# Patient Record
Sex: Male | Born: 1940 | Race: White | Hispanic: No | Smoking: Current every day smoker
Health system: Southern US, Community
[De-identification: ages and names within clinical notes are randomized; demographics above are authoritative.]

## PROBLEM LIST (undated history)

## (undated) DIAGNOSIS — Z8711 Personal history of peptic ulcer disease: Secondary | ICD-10-CM

## (undated) DIAGNOSIS — I82409 Acute embolism and thrombosis of unspecified deep veins of unspecified lower extremity: Secondary | ICD-10-CM

## (undated) DIAGNOSIS — I723 Aneurysm of iliac artery: Secondary | ICD-10-CM

## (undated) DIAGNOSIS — I509 Heart failure, unspecified: Secondary | ICD-10-CM

## (undated) DIAGNOSIS — F32A Depression, unspecified: Secondary | ICD-10-CM

## (undated) DIAGNOSIS — I219 Acute myocardial infarction, unspecified: Secondary | ICD-10-CM

## (undated) DIAGNOSIS — I639 Cerebral infarction, unspecified: Secondary | ICD-10-CM

## (undated) DIAGNOSIS — I7100 Dissection of unspecified site of aorta: Secondary | ICD-10-CM

## (undated) DIAGNOSIS — I10 Essential (primary) hypertension: Secondary | ICD-10-CM

## (undated) DIAGNOSIS — E785 Hyperlipidemia, unspecified: Secondary | ICD-10-CM

## (undated) DIAGNOSIS — F329 Major depressive disorder, single episode, unspecified: Secondary | ICD-10-CM

## (undated) DIAGNOSIS — I255 Ischemic cardiomyopathy: Secondary | ICD-10-CM

## (undated) DIAGNOSIS — I447 Left bundle-branch block, unspecified: Secondary | ICD-10-CM

## (undated) DIAGNOSIS — N200 Calculus of kidney: Secondary | ICD-10-CM

## (undated) DIAGNOSIS — I2510 Atherosclerotic heart disease of native coronary artery without angina pectoris: Secondary | ICD-10-CM

## (undated) DIAGNOSIS — Z8719 Personal history of other diseases of the digestive system: Secondary | ICD-10-CM

## (undated) DIAGNOSIS — I480 Paroxysmal atrial fibrillation: Secondary | ICD-10-CM

## (undated) DIAGNOSIS — J449 Chronic obstructive pulmonary disease, unspecified: Secondary | ICD-10-CM

## (undated) DIAGNOSIS — I679 Cerebrovascular disease, unspecified: Secondary | ICD-10-CM

## (undated) DIAGNOSIS — N189 Chronic kidney disease, unspecified: Secondary | ICD-10-CM

## (undated) HISTORY — PX: CARDIAC CATHETERIZATION: SHX172

## (undated) HISTORY — DX: Acute embolism and thrombosis of unspecified deep veins of unspecified lower extremity: I82.409

## (undated) HISTORY — DX: Essential (primary) hypertension: I10

## (undated) HISTORY — DX: Cerebrovascular disease, unspecified: I67.9

## (undated) HISTORY — DX: Dissection of unspecified site of aorta: I71.00

## (undated) HISTORY — DX: Left bundle-branch block, unspecified: I44.7

## (undated) HISTORY — DX: Ischemic cardiomyopathy: I25.5

## (undated) HISTORY — DX: Calculus of kidney: N20.0

## (undated) HISTORY — DX: Aneurysm of iliac artery: I72.3

## (undated) HISTORY — DX: Acute myocardial infarction, unspecified: I21.9

## (undated) HISTORY — DX: Chronic obstructive pulmonary disease, unspecified: J44.9

## (undated) HISTORY — PX: CYSTOSCOPY W/ STONE MANIPULATION: SHX1427

## (undated) HISTORY — DX: Atherosclerotic heart disease of native coronary artery without angina pectoris: I25.10

## (undated) HISTORY — PX: BACK SURGERY: SHX140

## (undated) HISTORY — DX: Cerebral infarction, unspecified: I63.9

## (undated) HISTORY — DX: Hyperlipidemia, unspecified: E78.5

## (undated) HISTORY — DX: Heart failure, unspecified: I50.9

---

## 1995-12-12 DIAGNOSIS — I219 Acute myocardial infarction, unspecified: Secondary | ICD-10-CM

## 1995-12-12 HISTORY — DX: Acute myocardial infarction, unspecified: I21.9

## 1996-12-11 HISTORY — PX: CORONARY ARTERY BYPASS GRAFT: SHX141

## 1998-11-24 ENCOUNTER — Encounter: Payer: Self-pay | Admitting: Urology

## 1998-11-26 ENCOUNTER — Encounter: Payer: Self-pay | Admitting: Urology

## 1998-11-26 ENCOUNTER — Ambulatory Visit (HOSPITAL_COMMUNITY): Admission: RE | Admit: 1998-11-26 | Discharge: 1998-11-26 | Payer: Self-pay | Admitting: Urology

## 2001-04-09 ENCOUNTER — Ambulatory Visit (HOSPITAL_COMMUNITY): Admission: RE | Admit: 2001-04-09 | Discharge: 2001-04-09 | Payer: Self-pay | Admitting: Cardiology

## 2002-04-16 ENCOUNTER — Emergency Department (HOSPITAL_COMMUNITY): Admission: EM | Admit: 2002-04-16 | Discharge: 2002-04-16 | Payer: Self-pay

## 2002-04-17 ENCOUNTER — Encounter: Payer: Self-pay | Admitting: Urology

## 2002-04-18 ENCOUNTER — Encounter: Payer: Self-pay | Admitting: Urology

## 2002-04-18 ENCOUNTER — Inpatient Hospital Stay (HOSPITAL_COMMUNITY): Admission: AD | Admit: 2002-04-18 | Discharge: 2002-04-20 | Payer: Self-pay | Admitting: Urology

## 2002-05-01 ENCOUNTER — Encounter: Payer: Self-pay | Admitting: Urology

## 2002-05-01 ENCOUNTER — Ambulatory Visit (HOSPITAL_BASED_OUTPATIENT_CLINIC_OR_DEPARTMENT_OTHER): Admission: RE | Admit: 2002-05-01 | Discharge: 2002-05-01 | Payer: Self-pay | Admitting: Urology

## 2004-06-10 HISTORY — PX: CAROTID ENDARTERECTOMY: SUR193

## 2004-06-22 ENCOUNTER — Ambulatory Visit (HOSPITAL_COMMUNITY): Admission: RE | Admit: 2004-06-22 | Discharge: 2004-06-22 | Payer: Self-pay | Admitting: *Deleted

## 2004-06-28 ENCOUNTER — Inpatient Hospital Stay (HOSPITAL_COMMUNITY): Admission: RE | Admit: 2004-06-28 | Discharge: 2004-06-29 | Payer: Self-pay | Admitting: *Deleted

## 2004-06-28 ENCOUNTER — Encounter (INDEPENDENT_AMBULATORY_CARE_PROVIDER_SITE_OTHER): Payer: Self-pay | Admitting: *Deleted

## 2004-12-11 DIAGNOSIS — I447 Left bundle-branch block, unspecified: Secondary | ICD-10-CM

## 2004-12-11 DIAGNOSIS — I480 Paroxysmal atrial fibrillation: Secondary | ICD-10-CM

## 2004-12-11 HISTORY — DX: Paroxysmal atrial fibrillation: I48.0

## 2004-12-11 HISTORY — DX: Left bundle-branch block, unspecified: I44.7

## 2005-05-16 ENCOUNTER — Ambulatory Visit: Payer: Self-pay | Admitting: Cardiology

## 2005-05-18 ENCOUNTER — Ambulatory Visit: Payer: Self-pay

## 2005-06-09 ENCOUNTER — Ambulatory Visit: Payer: Self-pay | Admitting: Cardiology

## 2005-07-04 ENCOUNTER — Ambulatory Visit: Payer: Self-pay | Admitting: Cardiology

## 2005-07-29 ENCOUNTER — Ambulatory Visit: Payer: Self-pay | Admitting: Cardiovascular Disease

## 2005-07-30 ENCOUNTER — Inpatient Hospital Stay (HOSPITAL_COMMUNITY): Admission: EM | Admit: 2005-07-30 | Discharge: 2005-08-04 | Payer: Self-pay | Admitting: Emergency Medicine

## 2005-07-31 ENCOUNTER — Encounter: Payer: Self-pay | Admitting: Cardiology

## 2005-08-01 ENCOUNTER — Encounter: Payer: Self-pay | Admitting: Cardiology

## 2005-08-10 ENCOUNTER — Ambulatory Visit: Payer: Self-pay | Admitting: Cardiology

## 2005-08-10 ENCOUNTER — Ambulatory Visit: Payer: Self-pay

## 2005-08-22 ENCOUNTER — Ambulatory Visit: Payer: Self-pay | Admitting: *Deleted

## 2005-08-27 ENCOUNTER — Emergency Department (HOSPITAL_COMMUNITY): Admission: EM | Admit: 2005-08-27 | Discharge: 2005-08-27 | Payer: Self-pay | Admitting: Emergency Medicine

## 2005-08-29 ENCOUNTER — Ambulatory Visit: Payer: Self-pay | Admitting: Cardiology

## 2005-08-29 ENCOUNTER — Ambulatory Visit: Payer: Self-pay | Admitting: Internal Medicine

## 2005-08-31 ENCOUNTER — Ambulatory Visit: Payer: Self-pay | Admitting: Cardiology

## 2005-09-06 ENCOUNTER — Ambulatory Visit: Payer: Self-pay | Admitting: Cardiology

## 2005-09-13 ENCOUNTER — Ambulatory Visit: Payer: Self-pay | Admitting: Cardiology

## 2005-09-20 ENCOUNTER — Ambulatory Visit: Payer: Self-pay | Admitting: Cardiology

## 2005-09-27 ENCOUNTER — Ambulatory Visit: Payer: Self-pay | Admitting: Cardiology

## 2005-09-27 ENCOUNTER — Ambulatory Visit: Payer: Self-pay | Admitting: Cardiovascular Disease

## 2005-10-04 ENCOUNTER — Ambulatory Visit: Payer: Self-pay | Admitting: Cardiology

## 2005-10-11 ENCOUNTER — Ambulatory Visit: Payer: Self-pay | Admitting: Cardiology

## 2005-10-19 ENCOUNTER — Ambulatory Visit: Payer: Self-pay | Admitting: Cardiology

## 2005-10-23 ENCOUNTER — Ambulatory Visit: Payer: Self-pay | Admitting: Internal Medicine

## 2005-10-23 ENCOUNTER — Encounter: Payer: Self-pay | Admitting: Cardiology

## 2005-10-23 ENCOUNTER — Ambulatory Visit: Payer: Self-pay | Admitting: Cardiology

## 2005-10-23 ENCOUNTER — Ambulatory Visit (HOSPITAL_COMMUNITY): Admission: RE | Admit: 2005-10-23 | Discharge: 2005-10-23 | Payer: Self-pay | Admitting: Cardiology

## 2005-10-31 ENCOUNTER — Ambulatory Visit: Payer: Self-pay | Admitting: Cardiology

## 2005-11-10 ENCOUNTER — Ambulatory Visit: Payer: Self-pay | Admitting: Cardiovascular Disease

## 2005-11-10 ENCOUNTER — Ambulatory Visit: Payer: Self-pay | Admitting: Cardiology

## 2005-11-13 ENCOUNTER — Ambulatory Visit: Payer: Self-pay | Admitting: Cardiology

## 2005-11-14 ENCOUNTER — Ambulatory Visit (HOSPITAL_COMMUNITY): Admission: RE | Admit: 2005-11-14 | Discharge: 2005-11-14 | Payer: Self-pay | Admitting: Cardiology

## 2005-11-17 ENCOUNTER — Ambulatory Visit: Payer: Self-pay | Admitting: Cardiology

## 2005-12-08 ENCOUNTER — Ambulatory Visit: Payer: Self-pay | Admitting: Cardiology

## 2005-12-19 ENCOUNTER — Ambulatory Visit: Payer: Self-pay | Admitting: Cardiology

## 2005-12-20 ENCOUNTER — Ambulatory Visit: Payer: Self-pay | Admitting: Cardiology

## 2005-12-20 ENCOUNTER — Inpatient Hospital Stay (HOSPITAL_COMMUNITY): Admission: AD | Admit: 2005-12-20 | Discharge: 2005-12-26 | Payer: Self-pay | Admitting: Cardiology

## 2005-12-29 ENCOUNTER — Ambulatory Visit: Payer: Self-pay | Admitting: Cardiology

## 2006-01-04 ENCOUNTER — Ambulatory Visit: Payer: Self-pay | Admitting: Cardiology

## 2006-01-12 ENCOUNTER — Ambulatory Visit: Payer: Self-pay | Admitting: Cardiology

## 2006-01-19 ENCOUNTER — Ambulatory Visit: Payer: Self-pay | Admitting: Internal Medicine

## 2006-01-29 ENCOUNTER — Ambulatory Visit: Payer: Self-pay | Admitting: Cardiology

## 2006-02-06 ENCOUNTER — Ambulatory Visit: Payer: Self-pay | Admitting: *Deleted

## 2006-03-06 ENCOUNTER — Ambulatory Visit: Payer: Self-pay | Admitting: Cardiology

## 2006-04-03 ENCOUNTER — Ambulatory Visit: Payer: Self-pay | Admitting: Cardiovascular Disease

## 2006-05-04 ENCOUNTER — Ambulatory Visit: Payer: Self-pay | Admitting: Cardiology

## 2006-05-21 ENCOUNTER — Ambulatory Visit: Payer: Self-pay | Admitting: Cardiology

## 2006-06-01 ENCOUNTER — Ambulatory Visit: Payer: Self-pay | Admitting: Cardiovascular Disease

## 2006-06-19 ENCOUNTER — Ambulatory Visit: Payer: Self-pay

## 2006-06-19 ENCOUNTER — Encounter: Payer: Self-pay | Admitting: Cardiology

## 2006-06-19 ENCOUNTER — Ambulatory Visit: Payer: Self-pay | Admitting: Cardiology

## 2006-07-11 ENCOUNTER — Ambulatory Visit: Payer: Self-pay | Admitting: *Deleted

## 2006-08-08 ENCOUNTER — Ambulatory Visit: Payer: Self-pay | Admitting: Cardiology

## 2006-09-05 ENCOUNTER — Ambulatory Visit: Payer: Self-pay | Admitting: Cardiology

## 2006-10-03 ENCOUNTER — Ambulatory Visit: Payer: Self-pay | Admitting: Cardiology

## 2006-10-04 ENCOUNTER — Ambulatory Visit: Payer: Self-pay | Admitting: Pulmonary Disease

## 2006-10-04 ENCOUNTER — Ambulatory Visit: Payer: Self-pay | Admitting: Cardiology

## 2006-10-04 ENCOUNTER — Inpatient Hospital Stay (HOSPITAL_COMMUNITY): Admission: EM | Admit: 2006-10-04 | Discharge: 2006-10-09 | Payer: Self-pay | Admitting: *Deleted

## 2006-10-05 ENCOUNTER — Encounter: Payer: Self-pay | Admitting: Internal Medicine

## 2006-10-15 ENCOUNTER — Ambulatory Visit: Payer: Self-pay | Admitting: Cardiology

## 2006-10-17 ENCOUNTER — Ambulatory Visit (HOSPITAL_COMMUNITY): Admission: RE | Admit: 2006-10-17 | Discharge: 2006-10-17 | Payer: Self-pay | Admitting: Cardiology

## 2006-10-17 ENCOUNTER — Ambulatory Visit: Payer: Self-pay | Admitting: Internal Medicine

## 2006-10-23 ENCOUNTER — Ambulatory Visit: Payer: Self-pay | Admitting: Cardiology

## 2006-10-26 ENCOUNTER — Ambulatory Visit: Payer: Self-pay | Admitting: Pulmonary Disease

## 2006-11-12 ENCOUNTER — Ambulatory Visit: Payer: Self-pay | Admitting: Internal Medicine

## 2006-12-10 ENCOUNTER — Ambulatory Visit: Payer: Self-pay | Admitting: Cardiovascular Disease

## 2007-01-07 ENCOUNTER — Ambulatory Visit: Payer: Self-pay | Admitting: Internal Medicine

## 2007-02-04 ENCOUNTER — Ambulatory Visit: Payer: Self-pay | Admitting: Cardiology

## 2007-03-04 ENCOUNTER — Ambulatory Visit: Payer: Self-pay | Admitting: Cardiology

## 2007-04-08 ENCOUNTER — Ambulatory Visit: Payer: Self-pay | Admitting: Cardiology

## 2007-04-12 ENCOUNTER — Ambulatory Visit: Payer: Self-pay | Admitting: Cardiology

## 2007-04-12 ENCOUNTER — Ambulatory Visit: Payer: Self-pay | Admitting: Pulmonary Disease

## 2007-04-12 LAB — CONVERTED CEMR LAB
AST: 30 units/L (ref 0–37)
Albumin: 3.8 g/dL (ref 3.5–5.2)
Alkaline Phosphatase: 82 units/L (ref 39–117)
CO2: 30 meq/L (ref 19–32)
Calcium: 8.8 mg/dL (ref 8.4–10.5)
GFR calc Af Amer: 71 mL/min
Glucose, Bld: 100 mg/dL — ABNORMAL HIGH (ref 70–99)
Sodium: 144 meq/L (ref 135–145)

## 2007-04-22 ENCOUNTER — Ambulatory Visit: Payer: Self-pay | Admitting: Cardiology

## 2007-04-22 ENCOUNTER — Ambulatory Visit: Payer: Self-pay | Admitting: Internal Medicine

## 2007-04-22 LAB — CONVERTED CEMR LAB
BUN: 10 mg/dL (ref 6–23)
Calcium: 8.7 mg/dL (ref 8.4–10.5)
Chloride: 111 meq/L (ref 96–112)
Creatinine, Ser: 1.1 mg/dL (ref 0.4–1.5)
GFR calc Af Amer: 87 mL/min
GFR calc non Af Amer: 72 mL/min
Glucose, Bld: 104 mg/dL — ABNORMAL HIGH (ref 70–99)
Sodium: 146 meq/L — ABNORMAL HIGH (ref 135–145)

## 2007-05-10 ENCOUNTER — Ambulatory Visit: Payer: Self-pay | Admitting: Cardiology

## 2007-05-10 LAB — CONVERTED CEMR LAB
CO2: 28 meq/L (ref 19–32)
Calcium: 8.3 mg/dL — ABNORMAL LOW (ref 8.4–10.5)
Chloride: 111 meq/L (ref 96–112)
Creatinine, Ser: 1.3 mg/dL (ref 0.4–1.5)
GFR calc Af Amer: 71 mL/min
Potassium: 3.2 meq/L — ABNORMAL LOW (ref 3.5–5.1)

## 2007-05-16 ENCOUNTER — Ambulatory Visit: Payer: Self-pay | Admitting: Cardiology

## 2007-05-16 LAB — CONVERTED CEMR LAB
CO2: 29 meq/L (ref 19–32)
Calcium: 9.2 mg/dL (ref 8.4–10.5)
Chloride: 108 meq/L (ref 96–112)
GFR calc Af Amer: 66 mL/min
GFR calc non Af Amer: 54 mL/min
Glucose, Bld: 99 mg/dL (ref 70–99)
Potassium: 3.7 meq/L (ref 3.5–5.1)
Sodium: 143 meq/L (ref 135–145)

## 2007-06-19 ENCOUNTER — Ambulatory Visit: Payer: Self-pay | Admitting: Cardiology

## 2007-06-19 ENCOUNTER — Ambulatory Visit: Payer: Self-pay

## 2007-07-23 ENCOUNTER — Ambulatory Visit: Payer: Self-pay | Admitting: Cardiology

## 2007-08-08 ENCOUNTER — Ambulatory Visit: Payer: Self-pay | Admitting: Cardiology

## 2007-09-12 ENCOUNTER — Ambulatory Visit: Payer: Self-pay | Admitting: Cardiology

## 2007-09-12 LAB — CONVERTED CEMR LAB
ALT: 36 units/L (ref 0–53)
AST: 24 units/L (ref 0–37)
Albumin: 3.6 g/dL (ref 3.5–5.2)
Total Protein: 6.8 g/dL (ref 6.0–8.3)
VLDL: 48 mg/dL — ABNORMAL HIGH (ref 0–40)

## 2007-10-10 ENCOUNTER — Ambulatory Visit: Payer: Self-pay | Admitting: Cardiology

## 2007-10-10 ENCOUNTER — Ambulatory Visit: Payer: Self-pay | Admitting: Internal Medicine

## 2007-10-11 ENCOUNTER — Ambulatory Visit: Payer: Self-pay

## 2007-10-14 ENCOUNTER — Ambulatory Visit: Payer: Self-pay | Admitting: Cardiology

## 2007-10-14 ENCOUNTER — Ambulatory Visit: Payer: Self-pay

## 2007-10-14 LAB — CONVERTED CEMR LAB
ALT: 28 units/L (ref 0–53)
Albumin: 4 g/dL (ref 3.5–5.2)
Bilirubin, Direct: 0.1 mg/dL (ref 0.0–0.3)
CO2: 28 meq/L (ref 19–32)
Calcium: 8.9 mg/dL (ref 8.4–10.5)
Chloride: 108 meq/L (ref 96–112)
Cholesterol: 139 mg/dL (ref 0–200)
Eosinophils Absolute: 0.6 10*3/uL (ref 0.0–0.6)
Eosinophils Relative: 7.1 % — ABNORMAL HIGH (ref 0.0–5.0)
GFR calc Af Amer: 56 mL/min
GFR calc non Af Amer: 46 mL/min
Glucose, Bld: 108 mg/dL — ABNORMAL HIGH (ref 70–99)
HCT: 42.4 % (ref 39.0–52.0)
Hemoglobin: 14.5 g/dL (ref 13.0–17.0)
LDL Cholesterol: 68 mg/dL (ref 0–99)
Lymphocytes Relative: 33.1 % (ref 12.0–46.0)
MCHC: 34.2 g/dL (ref 30.0–36.0)
MCV: 92.4 fL (ref 78.0–100.0)
Monocytes Absolute: 0.5 10*3/uL (ref 0.2–0.7)
Platelets: 176 10*3/uL (ref 150–400)
Sodium: 145 meq/L (ref 135–145)
TSH: 0.43 microintl units/mL (ref 0.35–5.50)
Total CHOL/HDL Ratio: 4.1

## 2007-11-06 ENCOUNTER — Ambulatory Visit: Payer: Self-pay | Admitting: Cardiology

## 2007-11-27 ENCOUNTER — Ambulatory Visit: Payer: Self-pay | Admitting: Cardiovascular Disease

## 2007-12-23 ENCOUNTER — Ambulatory Visit: Payer: Self-pay | Admitting: Cardiology

## 2008-01-20 ENCOUNTER — Ambulatory Visit: Payer: Self-pay | Admitting: Internal Medicine

## 2008-02-17 ENCOUNTER — Ambulatory Visit: Payer: Self-pay | Admitting: Cardiology

## 2008-03-17 ENCOUNTER — Ambulatory Visit: Payer: Self-pay | Admitting: Cardiology

## 2008-04-08 ENCOUNTER — Ambulatory Visit: Payer: Self-pay | Admitting: Cardiovascular Disease

## 2008-04-08 ENCOUNTER — Ambulatory Visit: Payer: Self-pay | Admitting: Cardiology

## 2008-04-08 LAB — CONVERTED CEMR LAB
ALT: 30 units/L (ref 0–53)
AST: 26 units/L (ref 0–37)
Alkaline Phosphatase: 67 units/L (ref 39–117)
BUN: 14 mg/dL (ref 6–23)
Bilirubin, Direct: 0.1 mg/dL (ref 0.0–0.3)
CO2: 27 meq/L (ref 19–32)
Calcium: 8.7 mg/dL (ref 8.4–10.5)
Chloride: 106 meq/L (ref 96–112)
Creatinine, Ser: 1.6 mg/dL — ABNORMAL HIGH (ref 0.4–1.5)
GFR calc non Af Amer: 46 mL/min
TSH: 0.5 microintl units/mL (ref 0.35–5.50)
Total Protein: 6.8 g/dL (ref 6.0–8.3)
Triglycerides: 228 mg/dL (ref 0–149)
VLDL: 46 mg/dL — ABNORMAL HIGH (ref 0–40)

## 2008-04-10 ENCOUNTER — Ambulatory Visit: Payer: Self-pay

## 2008-04-10 ENCOUNTER — Encounter: Payer: Self-pay | Admitting: Cardiology

## 2008-04-16 ENCOUNTER — Ambulatory Visit: Payer: Self-pay | Admitting: Internal Medicine

## 2008-05-13 ENCOUNTER — Ambulatory Visit: Payer: Self-pay | Admitting: Cardiovascular Disease

## 2008-06-10 ENCOUNTER — Ambulatory Visit: Payer: Self-pay | Admitting: Cardiology

## 2008-06-29 ENCOUNTER — Ambulatory Visit: Payer: Self-pay | Admitting: Cardiology

## 2008-07-28 ENCOUNTER — Ambulatory Visit: Payer: Self-pay

## 2008-07-28 ENCOUNTER — Ambulatory Visit: Payer: Self-pay | Admitting: Cardiology

## 2008-08-25 ENCOUNTER — Ambulatory Visit: Payer: Self-pay | Admitting: Cardiovascular Disease

## 2008-09-08 ENCOUNTER — Ambulatory Visit: Payer: Self-pay | Admitting: Cardiology

## 2008-09-25 ENCOUNTER — Ambulatory Visit: Payer: Self-pay | Admitting: Cardiology

## 2008-09-25 LAB — CONVERTED CEMR LAB
Alkaline Phosphatase: 87 units/L (ref 39–117)
BUN: 16 mg/dL (ref 6–23)
Bilirubin, Direct: 0.1 mg/dL (ref 0.0–0.3)
GFR calc non Af Amer: 59 mL/min
TSH: 0.47 microintl units/mL (ref 0.35–5.50)
Total CHOL/HDL Ratio: 4.5
Total Protein: 6.8 g/dL (ref 6.0–8.3)
VLDL: 55 mg/dL — ABNORMAL HIGH (ref 0–40)

## 2008-10-02 ENCOUNTER — Ambulatory Visit: Payer: Self-pay | Admitting: Vascular Surgery

## 2008-10-02 ENCOUNTER — Ambulatory Visit: Payer: Self-pay | Admitting: Internal Medicine

## 2008-10-02 ENCOUNTER — Inpatient Hospital Stay (HOSPITAL_COMMUNITY): Admission: EM | Admit: 2008-10-02 | Discharge: 2008-10-03 | Payer: Self-pay | Admitting: Emergency Medicine

## 2008-10-07 ENCOUNTER — Ambulatory Visit: Payer: Self-pay | Admitting: Cardiology

## 2008-10-07 LAB — CONVERTED CEMR LAB
BUN: 14 mg/dL (ref 6–23)
CO2: 32 meq/L (ref 19–32)
GFR calc Af Amer: 71 mL/min
GFR calc non Af Amer: 59 mL/min
Potassium: 3.3 meq/L — ABNORMAL LOW (ref 3.5–5.1)

## 2008-10-18 DIAGNOSIS — I4821 Permanent atrial fibrillation: Secondary | ICD-10-CM

## 2008-10-18 DIAGNOSIS — I255 Ischemic cardiomyopathy: Secondary | ICD-10-CM | POA: Insufficient documentation

## 2008-10-18 DIAGNOSIS — I059 Rheumatic mitral valve disease, unspecified: Secondary | ICD-10-CM | POA: Insufficient documentation

## 2008-10-29 ENCOUNTER — Ambulatory Visit: Payer: Self-pay | Admitting: Internal Medicine

## 2008-10-29 ENCOUNTER — Ambulatory Visit: Payer: Self-pay | Admitting: Cardiology

## 2008-10-29 LAB — CONVERTED CEMR LAB
CO2: 28 meq/L (ref 19–32)
Calcium: 9.2 mg/dL (ref 8.4–10.5)
Chloride: 108 meq/L (ref 96–112)
GFR calc non Af Amer: 59 mL/min
Glucose, Bld: 110 mg/dL — ABNORMAL HIGH (ref 70–99)
Potassium: 3.3 meq/L — ABNORMAL LOW (ref 3.5–5.1)

## 2008-11-06 ENCOUNTER — Ambulatory Visit: Payer: Self-pay | Admitting: Cardiology

## 2008-11-06 LAB — CONVERTED CEMR LAB
BUN: 14 mg/dL (ref 6–23)
CO2: 29 meq/L (ref 19–32)
Calcium: 8.7 mg/dL (ref 8.4–10.5)
Chloride: 107 meq/L (ref 96–112)
Glucose, Bld: 118 mg/dL — ABNORMAL HIGH (ref 70–99)
Potassium: 3.4 meq/L — ABNORMAL LOW (ref 3.5–5.1)
Sodium: 145 meq/L (ref 135–145)

## 2008-11-26 ENCOUNTER — Ambulatory Visit: Payer: Self-pay | Admitting: Cardiology

## 2008-11-26 LAB — CONVERTED CEMR LAB
BUN: 11 mg/dL (ref 6–23)
Calcium: 9.1 mg/dL (ref 8.4–10.5)
GFR calc non Af Amer: 71 mL/min
Glucose, Bld: 99 mg/dL (ref 70–99)

## 2008-12-16 ENCOUNTER — Ambulatory Visit: Payer: Self-pay | Admitting: Cardiology

## 2008-12-16 LAB — CONVERTED CEMR LAB
Albumin: 4.1 g/dL (ref 3.5–5.2)
CO2: 28 meq/L (ref 19–32)
Cholesterol: 224 mg/dL (ref 0–200)
Creatinine, Ser: 1.4 mg/dL (ref 0.4–1.5)
Direct LDL: 127.7 mg/dL
Eosinophils Absolute: 0.5 10*3/uL (ref 0.0–0.7)
Eosinophils Relative: 6.6 % — ABNORMAL HIGH (ref 0.0–5.0)
GFR calc Af Amer: 65 mL/min
Glucose, Bld: 103 mg/dL — ABNORMAL HIGH (ref 70–99)
HCT: 43.5 % (ref 39.0–52.0)
HDL: 31.9 mg/dL — ABNORMAL LOW (ref >39.0)
MCV: 93.2 fL (ref 78.0–100.0)
Monocytes Absolute: 0.6 10*3/uL (ref 0.1–1.0)
Neutro Abs: 4.5 10*3/uL (ref 1.4–7.7)
Neutrophils Relative %: 55.6 % (ref 43.0–77.0)
RDW: 13.7 % (ref 11.5–14.6)
Sodium: 143 meq/L (ref 135–145)
TSH: 0.63 microintl units/mL (ref 0.35–5.50)
Total Bilirubin: 0.7 mg/dL (ref 0.3–1.2)
Total Protein: 7 g/dL (ref 6.0–8.3)
Triglycerides: 397 mg/dL (ref 0–149)

## 2008-12-30 ENCOUNTER — Ambulatory Visit: Payer: Self-pay | Admitting: Internal Medicine

## 2009-01-13 ENCOUNTER — Ambulatory Visit: Payer: Self-pay | Admitting: *Deleted

## 2009-01-25 ENCOUNTER — Ambulatory Visit: Payer: Self-pay | Admitting: Cardiology

## 2009-02-08 ENCOUNTER — Ambulatory Visit: Payer: Self-pay | Admitting: Cardiovascular Disease

## 2009-03-01 ENCOUNTER — Ambulatory Visit: Payer: Self-pay | Admitting: Cardiology

## 2009-03-19 ENCOUNTER — Encounter: Payer: Self-pay | Admitting: Cardiology

## 2009-03-19 ENCOUNTER — Ambulatory Visit: Payer: Self-pay | Admitting: Cardiology

## 2009-04-08 ENCOUNTER — Ambulatory Visit: Payer: Self-pay | Admitting: *Deleted

## 2009-04-20 ENCOUNTER — Ambulatory Visit: Payer: Self-pay | Admitting: Cardiology

## 2009-05-11 ENCOUNTER — Encounter: Payer: Self-pay | Admitting: *Deleted

## 2009-05-19 ENCOUNTER — Ambulatory Visit: Payer: Self-pay | Admitting: Cardiology

## 2009-05-19 LAB — CONVERTED CEMR LAB
POC INR: 2.3
Protime: 18.5

## 2009-06-16 ENCOUNTER — Encounter: Payer: Self-pay | Admitting: *Deleted

## 2009-06-21 ENCOUNTER — Encounter (INDEPENDENT_AMBULATORY_CARE_PROVIDER_SITE_OTHER): Payer: Self-pay | Admitting: Cardiology

## 2009-06-21 ENCOUNTER — Ambulatory Visit: Payer: Self-pay | Admitting: Cardiology

## 2009-07-12 ENCOUNTER — Ambulatory Visit: Payer: Self-pay | Admitting: Internal Medicine

## 2009-07-12 LAB — CONVERTED CEMR LAB: POC INR: 2.8

## 2009-07-21 ENCOUNTER — Encounter (INDEPENDENT_AMBULATORY_CARE_PROVIDER_SITE_OTHER): Payer: Self-pay | Admitting: *Deleted

## 2009-08-04 ENCOUNTER — Ambulatory Visit: Payer: Self-pay | Admitting: Cardiovascular Disease

## 2009-08-25 ENCOUNTER — Ambulatory Visit: Payer: Self-pay | Admitting: Internal Medicine

## 2009-08-25 LAB — CONVERTED CEMR LAB: POC INR: 2

## 2009-09-09 ENCOUNTER — Telehealth: Payer: Self-pay | Admitting: Cardiology

## 2009-09-10 ENCOUNTER — Telehealth (INDEPENDENT_AMBULATORY_CARE_PROVIDER_SITE_OTHER): Payer: Self-pay | Admitting: *Deleted

## 2009-09-22 ENCOUNTER — Telehealth: Payer: Self-pay | Admitting: Cardiology

## 2009-09-22 ENCOUNTER — Ambulatory Visit: Payer: Self-pay | Admitting: Internal Medicine

## 2009-10-07 ENCOUNTER — Telehealth: Payer: Self-pay | Admitting: Cardiology

## 2009-10-08 ENCOUNTER — Encounter: Admission: RE | Admit: 2009-10-08 | Discharge: 2009-10-08 | Payer: Self-pay | Admitting: Vascular Surgery

## 2009-10-08 ENCOUNTER — Ambulatory Visit: Payer: Self-pay | Admitting: Vascular Surgery

## 2009-10-12 ENCOUNTER — Ambulatory Visit: Payer: Self-pay | Admitting: Cardiology

## 2009-10-12 DIAGNOSIS — I7103 Dissection of thoracoabdominal aorta: Secondary | ICD-10-CM

## 2009-10-12 DIAGNOSIS — I723 Aneurysm of iliac artery: Secondary | ICD-10-CM

## 2009-10-12 DIAGNOSIS — R930 Abnormal findings on diagnostic imaging of skull and head, not elsewhere classified: Secondary | ICD-10-CM | POA: Insufficient documentation

## 2009-10-12 DIAGNOSIS — I25810 Atherosclerosis of coronary artery bypass graft(s) without angina pectoris: Secondary | ICD-10-CM

## 2009-10-18 ENCOUNTER — Telehealth (INDEPENDENT_AMBULATORY_CARE_PROVIDER_SITE_OTHER): Payer: Self-pay | Admitting: *Deleted

## 2009-10-19 ENCOUNTER — Encounter (HOSPITAL_COMMUNITY): Admission: RE | Admit: 2009-10-19 | Discharge: 2009-12-09 | Payer: Self-pay | Admitting: Cardiology

## 2009-10-19 ENCOUNTER — Ambulatory Visit: Payer: Self-pay | Admitting: Cardiology

## 2009-10-19 ENCOUNTER — Ambulatory Visit: Payer: Self-pay

## 2009-11-10 ENCOUNTER — Ambulatory Visit: Payer: Self-pay | Admitting: Cardiology

## 2009-11-10 LAB — CONVERTED CEMR LAB
CO2: 27 meq/L (ref 19–32)
GFR calc non Af Amer: 64.16 mL/min (ref >60)
POC INR: 2.2

## 2009-11-22 ENCOUNTER — Telehealth: Payer: Self-pay | Admitting: Cardiology

## 2009-12-08 ENCOUNTER — Ambulatory Visit: Payer: Self-pay | Admitting: Cardiology

## 2009-12-22 ENCOUNTER — Ambulatory Visit: Payer: Self-pay | Admitting: Internal Medicine

## 2009-12-22 ENCOUNTER — Telehealth (INDEPENDENT_AMBULATORY_CARE_PROVIDER_SITE_OTHER): Payer: Self-pay | Admitting: *Deleted

## 2010-01-05 ENCOUNTER — Ambulatory Visit: Payer: Self-pay | Admitting: Cardiology

## 2010-01-26 ENCOUNTER — Ambulatory Visit: Payer: Self-pay | Admitting: Internal Medicine

## 2010-02-14 ENCOUNTER — Ambulatory Visit: Payer: Self-pay | Admitting: Internal Medicine

## 2010-03-14 ENCOUNTER — Ambulatory Visit: Payer: Self-pay | Admitting: Cardiovascular Disease

## 2010-03-14 LAB — CONVERTED CEMR LAB: POC INR: 2.5

## 2010-04-28 ENCOUNTER — Ambulatory Visit: Payer: Self-pay | Admitting: Cardiology

## 2010-04-28 ENCOUNTER — Ambulatory Visit: Payer: Self-pay | Admitting: Internal Medicine

## 2010-04-28 LAB — CONVERTED CEMR LAB: POC INR: 2.1

## 2010-05-03 ENCOUNTER — Encounter: Payer: Self-pay | Admitting: Internal Medicine

## 2010-05-18 ENCOUNTER — Encounter: Payer: Self-pay | Admitting: Cardiology

## 2010-05-30 ENCOUNTER — Ambulatory Visit: Payer: Self-pay | Admitting: Cardiology

## 2010-06-20 ENCOUNTER — Ambulatory Visit: Payer: Self-pay | Admitting: Internal Medicine

## 2010-06-20 LAB — CONVERTED CEMR LAB: POC INR: 2.8

## 2010-07-11 ENCOUNTER — Ambulatory Visit: Payer: Self-pay | Admitting: Cardiovascular Disease

## 2010-07-11 LAB — CONVERTED CEMR LAB: POC INR: 2.9

## 2010-08-16 ENCOUNTER — Ambulatory Visit: Payer: Self-pay | Admitting: Cardiovascular Disease

## 2010-08-16 LAB — CONVERTED CEMR LAB: POC INR: 3.2

## 2010-09-12 ENCOUNTER — Ambulatory Visit: Payer: Self-pay | Admitting: Internal Medicine

## 2010-09-12 LAB — CONVERTED CEMR LAB: POC INR: 2.8

## 2010-10-05 ENCOUNTER — Telehealth: Payer: Self-pay | Admitting: Cardiology

## 2010-10-10 ENCOUNTER — Ambulatory Visit: Payer: Self-pay | Admitting: Cardiology

## 2010-10-10 LAB — CONVERTED CEMR LAB: POC INR: 3.3

## 2010-11-02 ENCOUNTER — Ambulatory Visit: Payer: Self-pay | Admitting: Cardiology

## 2010-11-02 LAB — CONVERTED CEMR LAB: POC INR: 1.9

## 2010-11-24 ENCOUNTER — Ambulatory Visit: Payer: Self-pay | Admitting: Vascular Surgery

## 2010-11-30 ENCOUNTER — Ambulatory Visit: Payer: Self-pay | Admitting: Cardiology

## 2010-11-30 LAB — CONVERTED CEMR LAB: POC INR: 2.9

## 2010-12-28 ENCOUNTER — Ambulatory Visit: Admission: RE | Admit: 2010-12-28 | Discharge: 2010-12-28 | Payer: Self-pay | Source: Home / Self Care

## 2010-12-28 LAB — CONVERTED CEMR LAB: POC INR: 2.5

## 2011-01-01 ENCOUNTER — Encounter: Payer: Self-pay | Admitting: Cardiology

## 2011-01-08 LAB — CONVERTED CEMR LAB
ALT: 20 U/L
ALT: 22 units/L (ref 0–53)
AST: 17 units/L (ref 0–37)
AST: 21 U/L
Albumin: 4 g/dL
Alkaline Phosphatase: 90 U/L
BUN: 18 mg/dL
BUN: 8 mg/dL (ref 6–23)
Basophils Absolute: 0 10*3/uL (ref 0.0–0.1)
Basophils Absolute: 0 K/uL
Basophils Relative: 0.6 %
Basophils Relative: 0.7 % (ref 0.0–3.0)
Bilirubin, Direct: 0.1 mg/dL
CO2: 28 meq/L
Calcium: 8.8 mg/dL (ref 8.4–10.5)
Calcium: 9.1 mg/dL
Chloride: 109 meq/L
Cholesterol: 156 mg/dL
Creatinine, Ser: 1.1 mg/dL (ref 0.4–1.5)
Creatinine, Ser: 1.3 mg/dL
Direct LDL: 91.8 mg/dL
Eosinophils Absolute: 0.7 K/uL
Eosinophils Relative: 6 % — ABNORMAL HIGH (ref 0.0–5.0)
Eosinophils Relative: 8.8 % — ABNORMAL HIGH
GFR calc non Af Amer: 60.01 mL/min
GFR calc non Af Amer: 70.95 mL/min (ref >60)
Glucose, Bld: 89 mg/dL (ref 70–99)
Glucose, Bld: 92 mg/dL
HCT: 40.5 %
HDL: 39.9 mg/dL
Hemoglobin: 13.9 g/dL
Hemoglobin: 14.5 g/dL (ref 13.0–17.0)
Lymphocytes Relative: 32 %
Lymphs Abs: 2.4 K/uL
MCHC: 34.2 g/dL
MCV: 92 fL
MCV: 95.3 fL (ref 78.0–100.0)
Monocytes Absolute: 0.5 K/uL
Monocytes Relative: 7 %
Neutro Abs: 3.8 K/uL
Neutrophils Relative %: 51.6 %
Neutrophils Relative %: 58.6 % (ref 43.0–77.0)
POC INR: 2.1
Platelets: 175 K/uL
Potassium: 3.7 meq/L
RBC: 4.4 M/uL
RBC: 4.58 M/uL (ref 4.22–5.81)
RDW: 15.9 % — ABNORMAL HIGH
Sodium: 145 meq/L
TSH: 0.64 microintl units/mL (ref 0.35–5.50)
TSH: 0.7 u[IU]/mL
Total Bilirubin: 0.5 mg/dL
Total CHOL/HDL Ratio: 4
Total Protein: 6.4 g/dL
Total Protein: 6.8 g/dL (ref 6.0–8.3)
Triglycerides: 202 mg/dL — ABNORMAL HIGH (ref 0.0–149.0)
Triglycerides: 302 mg/dL — ABNORMAL HIGH
VLDL: 40.4 mg/dL — ABNORMAL HIGH (ref 0.0–40.0)
VLDL: 60.4 mg/dL — ABNORMAL HIGH
WBC: 6.8 10*3/uL (ref 4.5–10.5)
WBC: 7.4 10*3/microliter

## 2011-01-10 NOTE — Medication Information (Signed)
Summary: rov/ewj   Anticoagulant Therapy  Managed by: Weston Brass, PharmD Referring MD: Olga Millers MD Supervising MD: Shirlee Latch MD, Freida Busman Indication 1: Atrial Fibrillation (ICD-427.31) Lab Used: [facility] [place] Site: [address] INR POC 3.3 INR RANGE 2 - 3  Dietary changes: no    Health status changes: no    Bleeding/hemorrhagic complications: no    Recent/future hospitalizations: no    Any changes in medication regimen? no    Recent/future dental: no  Any missed doses?: no       Is patient compliant with meds? yes       Allergies: No Known Drug Allergies  Anticoagulation Management History:      The patient is taking warfarin and comes in today for a routine follow up visit.  Positive risk factors for bleeding include an age of 69 years or older.  The bleeding index is 'intermediate risk'.  Positive CHADS2 values include History of HTN.  Negative CHADS2 values include Age > 69 years old.  The start date was 07/31/2005.  His last INR was 2.2.  Anticoagulation responsible provider: Shirlee Latch MD, [place].  INR POC: 3.3.  Cuvette Lot#: [id number].  Exp: 10/2011.    Anticoagulation Management Assessment/Plan:      The patient's current anticoagulation dose is Coumadin 5 mg tabs: take as directed.  The target INR is 2 - 3.  The next INR is due 11/02/2010.  Anticoagulation instructions were given to patient.  Results were reviewed/authorized by Weston Brass, PharmD.  He was notified by Haynes Hoehn, PharmD Candidate.         Prior Anticoagulation Instructions: INR 2.8  Continue on same dosage 7.5mg  daily except 5mg  on Mondays, Wednesdays, and Fridays.  Recheck in 4 weeks.    Current Anticoagulation Instructions: INR 3.3  Skip today's dose, then resume normal Coumadin schedule:  1 tablet on Monday, Wednesday, and Friday, and 1.5 tablets on Sunday, Tuesday, Thursday, and Saturday. Try to eat a few extra servings of greens each week.    Return to clinic in 3-4  weeks.

## 2011-01-10 NOTE — Medication Information (Signed)
Summary: rov/ewj  Anticoagulant Therapy  Managed by: Bethena Midget, RN, BSN Referring MD: Olga Millers MD Supervising MD: Eden Emms MD, Theron Arista Indication 1: Atrial Fibrillation (ICD-427.31) Lab Used: [facility] [place] Site: [address] INR POC 2.9 INR RANGE 2 - 3  Dietary changes: no    Health status changes: no    Bleeding/hemorrhagic complications: no    Recent/future hospitalizations: no    Any changes in medication regimen? no    Recent/future dental: no  Any missed doses?: no       Is patient compliant with meds? yes       Allergies: No Known Drug Allergies  Anticoagulation Management History:      The patient is taking warfarin and comes in today for a routine follow up visit.  Positive risk factors for bleeding include an age of 70 years or older.  The bleeding index is 'intermediate risk'.  Positive CHADS2 values include History of HTN.  Negative CHADS2 values include Age > 49 years old.  The start date was 07/31/2005.  His last INR was 2.2.  Anticoagulation responsible provider: Eden Emms MD, Theron Arista.  INR POC: 2.9.  Cuvette Lot#: [id number].  Exp: 09/2011.    Anticoagulation Management Assessment/Plan:      The patient's current anticoagulation dose is Coumadin 5 mg tabs: take as directed.  The target INR is 2 - 3.  The next INR is due 08/08/2010.  Anticoagulation instructions were given to patient.  Results were reviewed/authorized by Bethena Midget, RN, BSN.  He was notified by Bethena Midget, RN, BSN.         Prior Anticoagulation Instructions: INR 2.8  Continue on same dosage 7.5mg  daily except 5mg  on Mondays, Wednesdays, and Fridays.  Recheck in 4 weeks.    Current Anticoagulation Instructions: INR 2.9 Continue 7.5mg  everyday except 5mg s on Mondays, Wednesdays and Fridays. Recheck in 4 weeks.

## 2011-01-10 NOTE — Medication Information (Signed)
Summary: ROV/LB  Anticoagulant Therapy  Managed by: Shelby Dubin, PharmD Referring MD: Olga Millers MD Supervising MD: Graciela Husbands MD, Viviann Spare Indication 1: Atrial Fibrillation (ICD-427.31) Lab Used: [facility] [place] Site: [address] INR POC 1.9 INR RANGE 2 - 3  Dietary changes: yes       Details: Patient has eaten more salads than usual.  Health status changes: no    Bleeding/hemorrhagic complications: no    Recent/future hospitalizations: no    Any changes in medication regimen? no    Recent/future dental: no  Any missed doses?: no       Is patient compliant with meds? yes       Allergies (verified): No Known Drug Allergies  Anticoagulation Management History:      Positive risk factors for bleeding include an age of 70 years or older.  The bleeding index is 'intermediate risk'.  Positive CHADS2 values include History of HTN.  Negative CHADS2 values include Age > 55 years old.  The start date was 07/31/2005.  His last INR was 2.2.  Anticoagulation responsible provider: Graciela Husbands MD, Viviann Spare.  INR POC: 1.9.  Exp: 09/2010.    Anticoagulation Management Assessment/Plan:      The patient's current anticoagulation dose is Coumadin 5 mg tabs: take as directed.  The target INR is 2 - 3.  The next INR is due 01/05/2010.  Anticoagulation instructions were given to patient.  Results were reviewed/authorized by Shelby Dubin, PharmD.  He was notified by Lew Dawes, PharmD Candidate.         Prior Anticoagulation Instructions: INR 1.8 TAKE ONE EXTRA TABLET TONIGHT THEN CONTINUE TAKING 1.5 TABLETS EVERYDAY EXCEPT TAKE 1 TABLET ON MONDAYS, WEDNESDAYS, AND FRIDAYS.   Current Anticoagulation Instructions: INR 1.9  Take an extra 0.5 tablet today then increase dose to 1.5 tablets daily except 1 tablet on Mondays and Fridays. Recheck in 2 weeks.

## 2011-01-10 NOTE — Medication Information (Signed)
Summary: rov/eac  Anticoagulant Therapy  Managed by: Bethena Midget, RN, BSN Referring MD: Olga Millers MD Supervising MD: Clifton James MD, Cristal Deer Indication 1: Atrial Fibrillation (ICD-427.31) Lab Used: [facility] [place] Site: [address] INR POC 2.5 INR RANGE 2 - 3  Dietary changes: no    Health status changes: no    Bleeding/hemorrhagic complications: no    Recent/future hospitalizations: no    Any changes in medication regimen? no    Recent/future dental: no  Any missed doses?: no       Is patient compliant with meds? yes       Allergies: No Known Drug Allergies  Anticoagulation Management History:      The patient is taking warfarin and comes in today for a routine follow up visit.  Positive risk factors for bleeding include an age of 64 years or older.  The bleeding index is 'intermediate risk'.  Positive CHADS2 values include History of HTN.  Negative CHADS2 values include Age > 61 years old.  The start date was 07/31/2005.  His last INR was 2.2.  Anticoagulation responsible provider: Clifton James MD, Cristal Deer.  INR POC: 2.5.  Cuvette Lot#: [id number].  Exp: 04/2011.    Anticoagulation Management Assessment/Plan:      The patient's current anticoagulation dose is Coumadin 5 mg tabs: take as directed.  The target INR is 2 - 3.  The next INR is due 04/11/2010.  Anticoagulation instructions were given to patient.  Results were reviewed/authorized by Bethena Midget, RN, BSN.  He was notified by Bethena Midget, RN, BSN.         Prior Anticoagulation Instructions: INR 2.4  Continue current dosing schedule of 1 tablet on Monday, Wednesday, and Friday and 1.5 tablets all other days.  Return to clinic in 4 weeks.   Current Anticoagulation Instructions: INR 2.5 Continue 7.5mg s everyday except 5mg s on Mondays. Wednesdays and Fridays. REcheck in 4 weeks.

## 2011-01-10 NOTE — Medication Information (Signed)
Summary: rov/tm  Anticoagulant Therapy  Managed by: Eda Keys, PharmD Referring MD: Olga Millers MD Supervising MD: Ladona Ridgel MD, Sharlot Gowda Indication 1: Atrial Fibrillation (ICD-427.31) Lab Used: [facility] [place] Site: [address] INR POC 2.4 INR RANGE 2 - 3  Dietary changes: no    Health status changes: no    Bleeding/hemorrhagic complications: no    Recent/future hospitalizations: no    Any changes in medication regimen? no    Recent/future dental: no  Any missed doses?: no       Is patient compliant with meds? yes       Allergies: No Known Drug Allergies  Anticoagulation Management History:      The patient is taking warfarin and comes in today for a routine follow up visit.  Positive risk factors for bleeding include an age of 29 years or older.  The bleeding index is 'intermediate risk'.  Positive CHADS2 values include History of HTN.  Negative CHADS2 values include Age > 69 years old.  The start date was 07/31/2005.  His last INR was 2.2.  Anticoagulation responsible provider: Ladona Ridgel MD, Sharlot Gowda.  INR POC: 2.4.  Cuvette Lot#: [id number].  Exp: 04/2011.    Anticoagulation Management Assessment/Plan:      The patient's current anticoagulation dose is Coumadin 5 mg tabs: take as directed.  The target INR is 2 - 3.  The next INR is due 03/14/2010.  Anticoagulation instructions were given to patient.  Results were reviewed/authorized by Eda Keys, PharmD.  He was notified by Eda Keys.         Prior Anticoagulation Instructions: INR 3.6 Skip Thursday dose then change dose to 7.5mg s everyday except 5mg s on Mondays, Wednesdays and Fridays. Recheck in 2 weeks.   Current Anticoagulation Instructions: INR 2.4  Continue current dosing schedule of 1 tablet on Monday, Wednesday, and Friday and 1.5 tablets all other days.  Return to clinic in 4 weeks.

## 2011-01-10 NOTE — Medication Information (Signed)
Summary: rov/ewj  Anticoagulant Therapy  Managed by: Cloyde Reams, RN, BSN Referring MD: Olga Millers MD Supervising MD: [name] MD, Reuel Boom Indication 1: Atrial Fibrillation (ICD-427.31) Lab Used: [facility] [place] Site: [address] INR POC 2.8 INR RANGE 2 - 3  Dietary changes: no    Health status changes: no    Bleeding/hemorrhagic complications: no    Recent/future hospitalizations: no    Any changes in medication regimen? no    Recent/future dental: no  Any missed doses?: no       Is patient compliant with meds? yes       Allergies: No Known Drug Allergies  Anticoagulation Management History:      The patient is taking warfarin and comes in today for a routine follow up visit.  Positive risk factors for bleeding include an age of 69 years or older.  The bleeding index is 'intermediate risk'.  Positive CHADS2 values include History of HTN.  Negative CHADS2 values include Age > 69 years old.  The start date was 07/31/2005.  His last INR was 2.2.  Anticoagulation responsible provider: Bensimhon MD, Reuel Boom.  INR POC: 2.8.  Cuvette Lot#: [id number].  Exp: 10/2011.    Anticoagulation Management Assessment/Plan:      The patient's current anticoagulation dose is Coumadin 5 mg tabs: take as directed.  The target INR is 2 - 3.  The next INR is due 10/10/2010.  Anticoagulation instructions were given to patient.  Results were reviewed/authorized by Cloyde Reams, RN, BSN.  He was notified by Cloyde Reams RN.         Prior Anticoagulation Instructions: INR 3.2  Take 1/2 tablet tomorrow, incr vit K intake, then resume same dosage 1.5 tablet daily except 1 tablet on Mondays, Wednesdays, and Fridays.  Recheck in 4 weeks.    Current Anticoagulation Instructions: INR 2.8  Continue on same dosage 7.5mg  daily except 5mg  on Mondays, Wednesdays, and Fridays.  Recheck in 4 weeks.

## 2011-01-10 NOTE — Medication Information (Signed)
Summary: rov/eac  Anticoagulant Therapy  Managed by: Cloyde Reams, RN, BSN Referring MD: Olga Millers MD Supervising MD: Shirlee Latch MD, [place] Indication 1: Atrial Fibrillation (ICD-427.31) Lab Used: [facility] [place] Site: [address] INR POC 2.5 INR RANGE 2 - 3  Dietary changes: no    Health status changes: no    Bleeding/hemorrhagic complications: no    Recent/future hospitalizations: no    Any changes in medication regimen? no    Recent/future dental: no  Any missed doses?: no       Is patient compliant with meds? yes       Allergies: No Known Drug Allergies  Anticoagulation Management History:      The patient is taking warfarin and comes in today for a routine follow up visit.  Positive risk factors for bleeding include an age of 42 years or older.  The bleeding index is 'intermediate risk'.  Positive CHADS2 values include History of HTN.  Negative CHADS2 values include Age > 26 years old.  The start date was 07/31/2005.  His last INR was 2.2.  Anticoagulation responsible provider: Shirlee Latch MD, [place].  INR POC: 2.5.  Cuvette Lot#: [id number].  Exp: 08/2011.    Anticoagulation Management Assessment/Plan:      The patient's current anticoagulation dose is Coumadin 5 mg tabs: take as directed.  The target INR is 2 - 3.  The next INR is due 06/20/2010.  Anticoagulation instructions were given to patient.  Results were reviewed/authorized by Cloyde Reams, RN, BSN.  He was notified by Cloyde Reams RN.         Prior Anticoagulation Instructions: INR 2.1  Continue taking 1 tablet (5 mg) on Monday, Wednesday, and Friday, and 1.5 tablets (7.5 mg) all other days. Return to clinic in 4 weeks.    Current Anticoagulation Instructions: INR 2.5  Continue on same dosage 7.5mg  daily except 5mg  on Mondays, Wednesdays, and Fridays.  Recheck in 4 weeks.

## 2011-01-10 NOTE — Medication Information (Signed)
Summary: rov/ewj  Anticoagulant Therapy  Managed by: Cloyde Reams, RN, BSN Referring MD: Olga Millers MD Supervising MD: Shirlee Latch MD, [place] Indication 1: Atrial Fibrillation (ICD-427.31) Lab Used: [facility] [place] Site: [address] INR POC 2.8 INR RANGE 2 - 3  Dietary changes: no    Health status changes: no    Bleeding/hemorrhagic complications: no    Recent/future hospitalizations: no    Any changes in medication regimen? no    Recent/future dental: no  Any missed doses?: no       Is patient compliant with meds? yes       Allergies: No Known Drug Allergies  Anticoagulation Management History:      The patient is taking warfarin and comes in today for a routine follow up visit.  Positive risk factors for bleeding include an age of 70 years or older.  The bleeding index is 'intermediate risk'.  Positive CHADS2 values include History of HTN.  Negative CHADS2 values include Age > 69 years old.  The start date was 07/31/2005.  His last INR was 2.2.  Anticoagulation responsible provider: Shirlee Latch MD, [place].  INR POC: 2.8.  Cuvette Lot#: [id number].  Exp: 08/2011.    Anticoagulation Management Assessment/Plan:      The patient's current anticoagulation dose is Coumadin 5 mg tabs: take as directed.  The target INR is 2 - 3.  The next INR is due 07/12/2010.  Anticoagulation instructions were given to patient.  Results were reviewed/authorized by Cloyde Reams, RN, BSN.  He was notified by Cloyde Reams RN.         Prior Anticoagulation Instructions: INR 2.5  Continue on same dosage 7.5mg  daily except 5mg  on Mondays, Wednesdays, and Fridays.  Recheck in 4 weeks.    Current Anticoagulation Instructions: INR 2.8  Continue on same dosage 7.5mg  daily except 5mg  on Mondays, Wednesdays, and Fridays.  Recheck in 4 weeks.

## 2011-01-10 NOTE — Medication Information (Signed)
Summary: rov/eh  Anticoagulant Therapy  Managed by: Bethena Midget, RN, BSN Referring MD: Olga Millers MD Supervising MD: Juanda Chance MD, Shaleah Nissley Indication 1: Atrial Fibrillation (ICD-427.31) Lab Used: [facility] [place] Site: [address] INR POC 2.9 INR RANGE 2 - 3  Dietary changes: no    Health status changes: no    Bleeding/hemorrhagic complications: no    Recent/future hospitalizations: no    Any changes in medication regimen? no    Recent/future dental: no  Any missed doses?: no       Is patient compliant with meds? yes      Comments: Pt. will  increase green leafy vegetables.   Allergies: No Known Drug Allergies  Anticoagulation Management History:      The patient is taking warfarin and comes in today for a routine follow up visit.  Positive risk factors for bleeding include an age of 38 years or older.  The bleeding index is 'intermediate risk'.  Positive CHADS2 values include History of HTN.  Negative CHADS2 values include Age > 53 years old.  The start date was 07/31/2005.  His last INR was 2.2.  Anticoagulation responsible provider: Juanda Chance MD, Smitty Cords.  INR POC: 2.9.  Cuvette Lot#: [id number].  Exp: 03/2011.    Anticoagulation Management Assessment/Plan:      The patient's current anticoagulation dose is Coumadin 5 mg tabs: take as directed.  The target INR is 2 - 3.  The next INR is due 01/26/2010.  Anticoagulation instructions were given to patient.  Results were reviewed/authorized by Bethena Midget, RN, BSN.  He was notified by Bethena Midget, RN, BSN.         Prior Anticoagulation Instructions: INR 1.9  Take an extra 0.5 tablet today then increase dose to 1.5 tablets daily except 1 tablet on Mondays and Fridays. Recheck in 2 weeks.  Current Anticoagulation Instructions: INR 2.9 Continue 1.5 tablets everyday except 1 tablet on Mondays and Fridays. Recheck INR in 3 weeks.

## 2011-01-10 NOTE — Progress Notes (Signed)
Summary: pt has questiosn and concerns   Phone Note Call from Patient Call back at [phone]   Reason for Call: Talk to Nurse, Talk to Doctor Summary of Call: pt has papers he needs you to fill out and he will be in the area Monday and he needs to talk to you. He also needs to talk to you about getting meds straight for the year Initial call taken by: Omer Jack,  October 05, 2010 8:23 AM  Follow-up for Phone Call        spoke with pt, questions answered Deliah Goody, RN  October 05, 2010 5:06 PM     Prescriptions: HYDRALAZINE HCL 25 MG TABS (HYDRALAZINE HCL) 1 tab by mouth three times a day  #270 x 4   Entered by:   Deliah Goody, RN   Authorized by:   Ferman Hamming, MD, [facility]   Signed by:   Deliah Goody, RN on 10/05/2010   Method used:   Faxed to ...       [facility] (mail-order)       [address]       [place], [place]  [id number]       Ph: [phone]       Fax: [phone]   RxID:   [id number] DOXAZOSIN MESYLATE 8 MG TABS (DOXAZOSIN MESYLATE) 1 tab once daily  #90 x 4   Entered by:   Deliah Goody, RN   Authorized by:   Ferman Hamming, MD, [facility]   Signed by:   Deliah Goody, RN on 10/05/2010   Method used:   Faxed to ...       [facility] (mail[facility]order)       [address]       [place], [place]  [id number]       Ph: [phone]       Fax: [phone]   RxID:   [id number] COUMADIN 5 MG TABS (WARFARIN SODIUM) take as directed  #90 x 4   Entered by:   Deliah Goody, RN   Authorized by:   Ferman Hamming, MD, [facility]   Signed by:   Deliah Goody, RN on 10/05/2010   Method used:   Faxed to ...       [facility] (mail[facility])       [address]       [place], [place]  [id number]       Ph: [phone]       Fax: [phone]   RxID:   [id number] METOPROLOL SUCCINATE 100 MG XR24H-TAB (METOPROLOL SUCCINATE) 1/2 tab once daily  #90 x 4   Entered by:   Deliah Goody, RN   Authorized by:    Ferman Hamming, MD, [facility]   Signed by:   Deliah Goody, RN on 10/05/2010   Method used:   Faxed to ...       [facility] (mail[facility])       [address]       [place], [place]  [id number]       Ph: [phone]       Fax: [phone]   RxID:   [id number] FUROSEMIDE 40 MG TABS (FUROSEMIDE) 1 tab once daily  #90 x 4   Entered by:   Deliah Goody, RN   Authorized by:   Ferman Hamming, MD, [facility]   Signed by:   Deliah Goody, RN on 10/05/2010   Method used:   Faxed to .[facility]  [facility] (mail[facility])       [address]       [place], [place]  [id number]       Ph: [phone]       Fax: [phone]   RxID:   [id number] PRAVASTATIN SODIUM 80 MG TABS (PRAVASTATIN SODIUM) 1 tab at bedtime  #90 x 4   Entered by:   Deliah Goody, RN   Authorized by:   Ferman Hamming, MD, [facility]   Signed by:   Deliah Goody, RN on 10/05/2010   Method used:   Faxed to ...       [facility] (mail[facility])       [address]       [place], [place]  [id number]       Ph: [phone]       Fax: [phone]   RxID:   [id number] LISINOPRIL 40 MG TABS (LISINOPRIL) 1 tab once daily  #90 x 4   Entered by:   Deliah Goody, RN   Authorized by:   Ferman Hamming, MD, [facility]   Signed by:   Deliah Goody, RN on 10/05/2010   Method used:   Faxed to ...       [facility] (mail[facility])       [address]       [place], [place]  [id number]       Ph: [phone]       Fax: [phone]   RxID:   [id number] PACERONE 200 MG TABS (AMIODARONE HCL) 1 tab once daily  #90 x 4   Entered by:   Deliah Goody, RN   Authorized by:   Ferman Hamming, MD, [facility]   Signed by:   Deliah Goody, RN on 10/05/2010   Method used:   Faxed to ...       [facility] (mail[facility]order)       [address]       [place], [place]  [id number]       Ph: [phone]       Fax: [phone]   RxID:   [id number] AMLODIPINE BESYLATE  10 MG TABS (AMLODIPINE BESYLATE) 1 tab once daily  #90 x 4   Entered by:   Deliah Goody, RN   Authorized by:   Ferman Hamming, MD, [facility]   Signed by:   Deliah Goody, RN on 10/05/2010   Method used:   Faxed to ...       [facility] (mail[facility])       [address]       [place], [place]  [id number]       Ph: [phone]       Fax: [phone]   RxID:   [id number] KLOR-CON M20 20 MEQ CR-TABS (POTASSIUM CHLORIDE CRYS CR) 2 tab three times a day  #540 x 4   Entered by:   Deliah Goody, RN   Authorized by:   Ferman Hamming, MD, [facility]   Signed by:   Deliah Goody, RN on 10/05/2010   Method used:   Faxed to ...       [facility] (mail[facility]order)       [address]       [place], [place]  [id number]       Ph: [phone]       Fax: [phone]   RxID:   [id number]

## 2011-01-10 NOTE — Progress Notes (Signed)
Summary: pt needs refill   Phone Note Refill Request Call back at cell# [phone] Message from:  [facility] @ fax# [phone]  Refills Requested: Medication #1:  HYDRALAZINE HCL 25 MG TABS 1 tab by mouth three times a day. Initial call taken by: Omer Jack,  December 22, 2009 12:40 PM    Prescriptions: HYDRALAZINE HCL 25 MG TABS (HYDRALAZINE HCL) 1 tab by mouth three times a day  #270 x 4   Entered by:   Kem Parkinson   Authorized by:   Ferman Hamming, MD, [facility]   Signed by:   Kem Parkinson on 12/22/2009   Method used:   Faxed to ...       [facility] (mail[facility]order)       [address]       [place], [place]  [id number]       Ph: [phone]       Fax: [phone]   RxID:   [id number]

## 2011-01-10 NOTE — Assessment & Plan Note (Signed)
Summary: 12:15/per check out/sf/    History of Present Illness: [name] returns for a followup today.  He has a history of coronary artery disease, status post coronary artery bypassing graft, history of ischemic cardiomyopathy, improved by most recent echocardiogram as well as COPD.  He also has a history of focal aortic dissection being managed medically.  His last echocardiogram was performed in May 2009 showed normal LV function.  His last catheterization in October 2007 showed patent grafts.  Note, he also has a left iliac artery aneurysm and mild cerebrovascular disease with history of carotid endarterectomy. He is followed by vascular surgery for these issues. Most recent Myoview was performed in November of 2010 and showed an ejection fraction of 44%, prior inferior and apical infarct with mild peri-infarct ischemia. It was felt to be low risk and we were therefore treated medically.  I last saw him in November of 2010. Since then he has dyspnea on exertion relieved with rest. This is a chronic issue. It is not related to chest pain. There is no orthopnea, PND, pedal edema, palpitations or syncope.  Current Medications (verified): 1)  Niaspan 500 Mg Cr-Tabs (Niacin (Antihyperlipidemic)) .... 3 Tab At Bedtime 2)  Klor-Con M20 20 Meq Cr-Tabs (Potassium Chloride Crys Cr) .... 2 Tab Three Times A Day 3)  Amlodipine Besylate 10 Mg Tabs (Amlodipine Besylate) .[name].. 1 Tab Once Daily 4)  Pacerone 200 Mg Tabs (Amiodarone Hcl) .[name].. 1 Tab Once Daily 5)  Lisinopril 40 Mg Tabs (Lisinopril) .[name].. 1 Tab Once Daily 6)  Pravastatin Sodium 80 Mg Tabs (Pravastatin Sodium) .[name].. 1 Tab At Bedtime 7)  Furosemide 40 Mg Tabs (Furosemide) .[name].. 1 Tab Once Daily 8)  Aspirin 81 Mg Tbec (Aspirin) .... Take One Tablet By Mouth Daily 9)  Metoprolol Succinate 100 Mg Xr24h-Tab (Metoprolol Succinate) .... 1/2 Tab Once Daily 10)  Coumadin 5 Mg Tabs (Warfarin Sodium) .... Take As Directed 11)  Doxazosin Mesylate 8 Mg Tabs  (Doxazosin Mesylate) .[name].. 1 Tab Once Daily 12)  Multivitamins   Tabs (Multiple Vitamin) .[name].. 1 Tab Once Daily 13)  Nitroglycerin 0.4 Mg Subl (Nitroglycerin) .... One Tablet Under Tongue Every 5 Minutes As Needed For Chest Pain---May Repeat Times Three 14)  Spiriva Handihaler 18 Mcg Caps (Tiotropium Bromide Monohydrate) .... Use As Needed 15)  Hydralazine Hcl 25 Mg Tabs (Hydralazine Hcl) .[name].. 1 Tab By Mouth Three Times A Day  Allergies: No Known Drug Allergies  Past History:  Past Medical History: Reviewed history from 10/12/2009 and no changes required. Atrial Fibrillation CAD Cerebrovascular Disease C O P D Hyperlipidemia Hypertension Mitral Insufficiency Nephrolithiasis Amiodarone Ischemic cardiomyopathy Left iliac artery anneurysm History of focal aortic dissection  Past Surgical History: Reviewed history from 10/18/2008 and no changes required. Uteroscopy and stent 5-03 CABG 1998 Left CEA 7/05  Social History: Reviewed history from 10/18/2008 and no changes required. Tobacco Use - Former.  Alcohol Use - no  Review of Systems       no fevers or chills, productive cough, hemoptysis, dysphasia, odynophagia, melena, hematochezia, dysuria, hematuria, rash, seizure activity, orthopnea, PND, pedal edema, claudication. Remaining systems are negative.   Vital Signs:  Patient profile:   70 year old male Height:      73 inches Weight:      264 pounds BMI:     34.96 Resp:     12 per minute BP sitting:   140 / 80  (left arm)  Vitals Entered By: Kem Parkinson (Apr 28, 2010 3:28 PM)  Physical Exam  General:  Well-developed obese in no acute distress.  Skin is warm and dry.  HEENT is normal.  Neck is supple. No thyromegaly.  Chest is clear to auscultation with normal expansion.  Cardiovascular exam is regular rate and rhythm.  Abdominal exam nontender or distended. No masses palpated. Extremities show no edema. neuro grossly intact    EKG  Procedure date:   04/28/2010  Findings:      Sinus bradycardia at a rate of 50. Left bundle branch block  Impression & Recommendations:  Problem # 1:  ILIAC ARTERY ANEURYSM (ICD-442.2) Followed by vascular surgery.  Problem # 2:  COUMADIN THERAPY (ICD-V58.61) Goal INR 2-3. Check CBC. 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  Problem # 4:  CORONARY ATHEROSCLEROSIS NATIVE CORONARY ARTERY (ICD-414.01) Last Myoview low risk. Continue medical therapy to include aspirin, ACE inhibitor, beta blocker and statin. His updated medication list for this problem includes:    Amlodipine Besylate 10 Mg Tabs (Amlodipine besylate) .[name]... 1 tab once daily    Lisinopril 40 Mg Tabs (Lisinopril) .[name]... 1 tab once daily    Aspirin 81 Mg Tbec (Aspirin) .[name]... Take one tablet by mouth daily    Metoprolol Succinate 100 Mg Xr24h-tab (Metoprolol succinate) .[name]... 1/2 tab once daily    Coumadin 5 Mg Tabs (Warfarin sodium) .[name]... Take as directed    Nitroglycerin 0.4 Mg Subl (Nitroglycerin) ..... One tablet under tongue every 5 minutes as needed for chest pain---may repeat times three  Problem # 5:  ATRIAL FIBRILLATION (ICD-427.31) Patient remains in sinus rhythm. Continue amiodarone and Coumadin. Check TSH, liver functions and chest x-ray. His updated medication list for this problem includes:    Pacerone 200 Mg Tabs (Amiodarone hcl) .[name]... 1 tab once daily    Aspirin 81 Mg Tbec (Aspirin) .[name]... Take one tablet by mouth daily    Metoprolol Succinate 100 Mg Xr24h-tab (Metoprolol succinate) .[name]... 1/2 tab once daily    Coumadin 5 Mg Tabs (Warfarin sodium) .[name]... 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 Followed by vascular surgery. His updated medication list  for this problem includes:    Aspirin 81 Mg Tbec (Aspirin) .[name]... Take one tablet by mouth daily    Coumadin 5 Mg Tabs (Warfarin sodium) .[name]... Take as directed  Problem # 7:  HYPERCHOLESTEROLEMIA, PURE (ICD-272.0) Continue present medications. Check lipids and liver. His updated medication list for this problem includes:    Niaspan 500 Mg Cr-tabs (Niacin (antihyperlipidemic)) .Marland KitchenMarland KitchenMarland KitchenMarland Kitchen 3 tab at bedtime    Pravastatin Sodium 80 Mg Tabs (Pravastatin sodium) .[name]... 1 tab at bedtime  Orders: TLB-Lipid Panel ([id number]) TLB-Hepatic/Liver Function Pnl ([id number]-HEPATIC)  Problem # 8:  HYPERTENSION, BENIGN (ICD-401.1) Blood pressure controlled on present medications. Will continue. Check bmet. His updated medication list for this problem includes:    Amlodipine Besylate 10 Mg Tabs (Amlodipine besylate) .[name]... 1 tab once daily    Lisinopril 40 Mg Tabs (Lisinopril) .[name]... 1 tab once daily    Furosemide 40 Mg Tabs (Furosemide) .[name]... 1 tab once daily    Aspirin 81 Mg Tbec (Aspirin) .[name]... Take one tablet by mouth daily    Metoprolol Succinate 100 Mg Xr24h-tab (Metoprolol succinate) .[name]... 1/2 tab once daily    Doxazosin Mesylate 8 Mg Tabs (Doxazosin mesylate) .[name]... 1 tab once daily  Hydralazine Hcl 25 Mg Tabs (Hydralazine hcl) .[name]... 1 tab by mouth three times a day  Orders: TLB-BMP (Basic Metabolic Panel-BMET) ([id number])  Problem # 9:  CARDIOMYOPATHY, ISCHEMIC (ICD-414.8) Continue beta blocker and ACE inhibitor. His updated medication list for this problem includes:    Amlodipine Besylate 10 Mg Tabs (Amlodipine besylate) .[name]... 1 tab once daily    Pacerone 200 Mg Tabs (Amiodarone hcl) .[name]... 1 tab once daily    Lisinopril 40 Mg Tabs (Lisinopril) .[name]... 1 tab once daily    Furosemide 40 Mg Tabs (Furosemide) .[name]... 1 tab once daily    Aspirin 81 Mg Tbec (Aspirin) .[name]... Take one tablet by mouth daily    Metoprolol Succinate 100 Mg Xr24h-tab (Metoprolol succinate) .[name]... 1/2 tab once daily    Coumadin 5  Mg Tabs (Warfarin sodium) .[name]... Take as directed    Nitroglycerin 0.4 Mg Subl (Nitroglycerin) ..... One tablet under tongue every 5 minutes as needed for chest pain---may repeat times three  Patient Instructions: 1)  Your physician recommends that you schedule a follow-up appointment in: 6 MONTHS

## 2011-01-10 NOTE — Medication Information (Signed)
Summary: rov/tm  Anticoagulant Therapy  Managed by: Cloyde Reams, RN, BSN Referring MD: Olga Millers MD Supervising MD: [name] MD, Reuel Boom Indication 1: Atrial Fibrillation (ICD-427.31) Lab Used: [facility] [place] Site: [address] INR POC 3.2 INR RANGE 2 - 3  Dietary changes: yes       Details: Decr amt of vit K intake.   Health status changes: no    Bleeding/hemorrhagic complications: no    Recent/future hospitalizations: no    Any changes in medication regimen? no    Recent/future dental: no  Any missed doses?: no       Is patient compliant with meds? yes       Allergies: No Known Drug Allergies  Anticoagulation Management History:      The patient is taking warfarin and comes in today for a routine follow up visit.  Positive risk factors for bleeding include an age of 69 years or older.  The bleeding index is 'intermediate risk'.  Positive CHADS2 values include History of HTN.  Negative CHADS2 values include Age > 69 years old.  The start date was 07/31/2005.  His last INR was 2.2.  Anticoagulation responsible provider: Bensimhon MD, Reuel Boom.  INR POC: 3.2.  Cuvette Lot#: [id number].  Exp: 09/2011.    Anticoagulation Management Assessment/Plan:      The patient's current anticoagulation dose is Coumadin 5 mg tabs: take as directed.  The target INR is 2 - 3.  The next INR is due 09/13/2010.  Anticoagulation instructions were given to patient.  Results were reviewed/authorized by Cloyde Reams, RN, BSN.  He was notified by Cloyde Reams RN.         Prior Anticoagulation Instructions: INR 2.9 Continue 7.5mg  everyday except 5mg s on Mondays, Wednesdays and Fridays. Recheck in 4 weeks.   Current Anticoagulation Instructions: INR 3.2  Take 1/2 tablet tomorrow, incr vit K intake, then resume same dosage 1.5 tablet daily except 1 tablet on Mondays, Wednesdays, and Fridays.  Recheck in 4 weeks.

## 2011-01-10 NOTE — Medication Information (Signed)
Summary: ccr/saf  Anticoagulant Therapy  Managed by: Eda Keys, PharmD Referring MD: Olga Millers MD Supervising MD: Ladona Ridgel MD, Sharlot Gowda Indication 1: Atrial Fibrillation (ICD-427.31) Lab Used: [facility] [place] Site: [address] INR POC 2.1 INR RANGE 2 - 3  Dietary changes: no    Health status changes: no    Bleeding/hemorrhagic complications: no    Recent/future hospitalizations: no    Any changes in medication regimen? no    Recent/future dental: no  Any missed doses?: no       Is patient compliant with meds? yes       Allergies: No Known Drug Allergies  Anticoagulation Management History:      The patient is taking warfarin and comes in today for a routine follow up visit.  Positive risk factors for bleeding include an age of 69 years or older.  The bleeding index is 'intermediate risk'.  Positive CHADS2 values include History of HTN.  Negative CHADS2 values include Age > 83 years old.  The start date was 07/31/2005.  His last INR was 2.2.  Anticoagulation responsible provider: Ladona Ridgel MD, Sharlot Gowda.  INR POC: 2.1.  Cuvette Lot#: [id number].  Exp: 07/2011.    Anticoagulation Management Assessment/Plan:      The patient's current anticoagulation dose is Coumadin 5 mg tabs: take as directed.  The target INR is 2 - 3.  The next INR is due 05/30/2010.  Anticoagulation instructions were given to patient.  Results were reviewed/authorized by Eda Keys, PharmD.  He was notified by Eda Keys.         Prior Anticoagulation Instructions: INR 2.5 Continue 7.5mg s everyday except 5mg s on Mondays. Wednesdays and Fridays. REcheck in 4 weeks.   Current Anticoagulation Instructions: INR 2.1  Continue taking 1 tablet (5 mg) on Monday, Wednesday, and Friday, and 1.5 tablets (7.5 mg) all other days. Return to clinic in 4 weeks.

## 2011-01-10 NOTE — Medication Information (Signed)
Summary: rov/tm  Anticoagulant Therapy  Managed by: Bethena Midget, RN, BSN Referring MD: Olga Millers MD Supervising MD: [name] MD, Fayrene Fearing Indication 1: Atrial Fibrillation (ICD-427.31) Lab Used: [facility] [place] Site: [address] INR POC 3.6 INR RANGE 2 - 3  Dietary changes: no    Health status changes: no    Bleeding/hemorrhagic complications: no    Recent/future hospitalizations: no    Any changes in medication regimen? no    Recent/future dental: no  Any missed doses?: no       Is patient compliant with meds? yes       Allergies: No Known Drug Allergies  Anticoagulation Management History:      The patient is taking warfarin and comes in today for a routine follow up visit.  Positive risk factors for bleeding include an age of 70 years or older.  The bleeding index is 'intermediate risk'.  Positive CHADS2 values include History of HTN.  Negative CHADS2 values include Age > 38 years old.  The start date was 07/31/2005.  His last INR was 2.2.  Anticoagulation responsible provider: Emmelyn Schmale MD, Fayrene Fearing.  INR POC: 3.6.  Cuvette Lot#: [id number].  Exp: 03/2011.    Anticoagulation Management Assessment/Plan:      The patient's current anticoagulation dose is Coumadin 5 mg tabs: take as directed.  The target INR is 2 - 3.  The next INR is due 02/14/2010.  Anticoagulation instructions were given to patient.  Results were reviewed/authorized by Bethena Midget, RN, BSN.  He was notified by Bethena Midget, RN, BSN.         Prior Anticoagulation Instructions: INR 2.9 Continue 1.5 tablets everyday except 1 tablet on Mondays and Fridays. Recheck INR in 3 weeks.   Current Anticoagulation Instructions: INR 3.6 Skip Thursday dose then change dose to 7.5mg s everyday except 5mg s on Mondays, Wednesdays and Fridays. Recheck in 2 weeks.

## 2011-01-10 NOTE — Letter (Signed)
Summary: Custom - Lipid  [facility], Main Office  [address]   [place], [place] [address]   Phone: [phone]  Fax: [phone]     May 03, 2010 MRN: [id number]   [facility] [address] [place], [place]  [address]   Dear Mr. Rog,  We have reviewed your cholesterol results.  They are as follows:     Total Cholesterol:    156 (Desirable: less than 200)       HDL  Cholesterol:     39.90  (Desirable: greater than 40 for men and 50 for women)       LDL Cholesterol:      91.8  (Desirable: less than 100 for low risk and less than 70 for moderate to high risk)       Triglycerides:       302.0  (Desirable: less than 150)  Our recommendations include:These numbers look good. Continue on the same medicine. Sodium, potassium, blood count, thyroid, kidney and  Liver function are normal. Take care, Dr. Darel Hong.    Call our office at the number listed above if you have any questions.  Lowering your LDL cholesterol is important, but it is only one of a large number of "risk factors" that may indicate that you are at risk for heart disease, stroke or other complications of hardening of the arteries.  Other risk factors include:   A.  Cigarette Smoking* B.  High Blood Pressure* C.  Obesity* D.   Low HDL Cholesterol (see yours above)* E.   Diabetes Mellitus (higher risk if your is uncontrolled) F.  Family history of premature heart disease G.  Previous history of stroke or cardiovascular disease    *These are risk factors YOU HAVE CONTROL OVER.  For more information, visit .  There is now evidence that lowering the TOTAL CHOLESTEROL AND LDL CHOLESTEROL can reduce the risk of heart disease.  The American Heart Association recommends the following guidelines for the treatment of elevated cholesterol:  1.  If there is now current heart disease and less than two risk factors, TOTAL CHOLESTEROL should be less than 200 and LDL CHOLESTEROL should be  less than 100. 2.  If there is current heart disease or two or more risk factors, TOTAL CHOLESTEROL should be less than 200 and LDL CHOLESTEROL should be less than 70.  A diet low in cholesterol, saturated fat, and calories is the cornerstone of treatment for elevated cholesterol.  Cessation of smoking and exercise are also important in the management of elevated cholesterol and preventing vascular disease.  Studies have shown that 30 to 60 minutes of physical activity most days can help lower blood pressure, lower cholesterol, and keep your weight at a healthy level.  Drug therapy is used when cholesterol levels do not respond to therapeutic lifestyle changes (smoking cessation, diet, and exercise) and remains unacceptably high.  If medication is started, it is important to have you levels checked periodically to evaluate the need for further treatment options.  Thank you,    [employer] Team

## 2011-01-10 NOTE — Medication Information (Signed)
Summary: rov/cs   Anticoagulant Therapy  Managed by: Weston Brass, PharmD Referring MD: Olga Millers MD Supervising MD: Shirlee Latch MD, Freida Busman Indication 1: Atrial Fibrillation (ICD-427.31) Lab Used: [facility] [place] Site: [address] INR POC 1.9 INR RANGE 2 - 3  Dietary changes: no    Health status changes: no    Bleeding/hemorrhagic complications: no    Recent/future hospitalizations: no    Any changes in medication regimen? no    Recent/future dental: no  Any missed doses?: yes     Details: May have missed 1 or 2 doses  Is patient compliant with meds? yes      Comments: Advised pt to return in 3 weeks but he insisted on 4 weeks  Allergies: No Known Drug Allergies  Anticoagulation Management History:      The patient is taking warfarin and comes in today for a routine follow up visit.  Positive risk factors for bleeding include an age of 70 years or older.  The bleeding index is 'intermediate risk'.  Positive CHADS2 values include History of HTN.  Negative CHADS2 values include Age > 16 years old.  The start date was 07/31/2005.  His last INR was 2.2.  Anticoagulation responsible Emily Massar: Shirlee Latch MD, [place].  INR POC: 1.9.  Cuvette Lot#: [id number].  Exp: 11/2011.    Anticoagulation Management Assessment/Plan:      The patient's current anticoagulation dose is Coumadin 5 mg tabs: take as directed.  The target INR is 2 - 3.  The next INR is due 11/30/2010.  Anticoagulation instructions were given to patient.  Results were reviewed/authorized by Weston Brass, PharmD.  He was notified by Hoy Register, PharmD Candidate.         Prior Anticoagulation Instructions: INR 3.3  Skip today's dose, then resume normal Coumadin schedule:  1 tablet on Monday, Wednesday, and Friday, and 1.5 tablets on Sunday, Tuesday, Thursday, and Saturday. Try to eat a few extra servings of greens each week.    Return to clinic in 3-4 weeks.    Current Anticoagulation Instructions: INR  1.9 Take 1.5 tablet today, then resume previous dose of 7.5 mg everyday except 5 mg on Monday, Wednesday, and Friday Recheck INR in 4 weeks

## 2011-01-12 NOTE — Medication Information (Signed)
Summary: rov/tm   Anticoagulant Therapy  Managed by: Weston Brass, PharmD Referring MD: Olga Millers MD Supervising MD: Shirlee Latch MD, Freida Busman Indication 1: Atrial Fibrillation (ICD-427.31) Lab Used: [facility] [place] Site: [address] INR POC 2.5 INR RANGE 2 - 3  Dietary changes: no    Health status changes: no    Bleeding/hemorrhagic complications: no    Recent/future hospitalizations: no    Any changes in medication regimen? no    Recent/future dental: no  Any missed doses?: no       Is patient compliant with meds? yes       Allergies: No Known Drug Allergies  Anticoagulation Management History:      The patient is taking warfarin and comes in today for a routine follow up visit.  Positive risk factors for bleeding include an age of 70 years or older.  The bleeding index is 'intermediate risk'.  Positive CHADS2 values include History of HTN.  Negative CHADS2 values include Age > 58 years old.  The start date was 07/31/2005.  His last INR was 2.2.  Anticoagulation responsible provider: Shirlee Latch MD, Kamica Florance.  INR POC: 2.5.  Cuvette Lot#: [id number].  Exp: 01/2012.    Anticoagulation Management Assessment/Plan:      The patient's current anticoagulation dose is Coumadin 5 mg tabs: take as directed.  The target INR is 2 - 3.  The next INR is due 01/24/2011.  Anticoagulation instructions were given to patient.  Results were reviewed/authorized by Weston Brass, PharmD.  He was notified by Linward Headland, PharmD candidate.         Prior Anticoagulation Instructions: INR 2.9 Continue 7.5mg  everyday except 5mg s on Mondays, Wednesdays and Fridays. Recheck in 4 weeks.   Current Anticoagulation Instructions: INR 2.5 (goal INR: 2-3)  Take 1 and 1/2 tablets on Sundays, Tuesdays, Thursdays, and Saturdays and 1 tablet on Mondays, Wednesdays, and Fridays.  Recheck in 4 weeks.

## 2011-01-12 NOTE — Medication Information (Signed)
Summary: rov/nb  Anticoagulant Therapy  Managed by: Bethena Midget, RN, BSN Referring MD: Olga Millers MD Supervising MD: Shirlee Latch MD, Kelcey Korus Indication 1: Atrial Fibrillation (ICD-427.31) Lab Used: [facility] [place] Site: [address] INR POC 2.9 INR RANGE 2 - 3  Dietary changes: no    Health status changes: no    Bleeding/hemorrhagic complications: no    Recent/future hospitalizations: no    Any changes in medication regimen? no    Recent/future dental: no  Any missed doses?: no       Is patient compliant with meds? yes       Allergies: No Known Drug Allergies  Anticoagulation Management History:      The patient is taking warfarin and comes in today for a routine follow up visit.  Positive risk factors for bleeding include an age of 70 years or older.  The bleeding index is 'intermediate risk'.  Positive CHADS2 values include History of HTN.  Negative CHADS2 values include Age > 70 years old.  The start date was 07/31/2005.  His last INR was 2.2.  Anticoagulation responsible provider: Shirlee Latch MD, Evony Rezek.  INR POC: 2.9.  Cuvette Lot#: [id number].  Exp: 01/2012.    Anticoagulation Management Assessment/Plan:      The patient's current anticoagulation dose is Coumadin 5 mg tabs: take as directed.  The target INR is 2 - 3.  The next INR is due 12/28/2010.  Anticoagulation instructions were given to patient.  Results were reviewed/authorized by Bethena Midget, RN, BSN.  He was notified by Bethena Midget, RN, BSN.         Prior Anticoagulation Instructions: INR 1.9 Take 1.5 tablet today, then resume previous dose of 7.5 mg everyday except 5 mg on Monday, Wednesday, and Friday Recheck INR in 4 weeks  Current Anticoagulation Instructions: INR 2.9 Continue 7.5mg  everyday except 5mg s on Mondays, Wednesdays and Fridays. Recheck in 4 weeks.

## 2011-01-24 ENCOUNTER — Encounter: Payer: Self-pay | Admitting: Cardiology

## 2011-01-24 ENCOUNTER — Encounter (INDEPENDENT_AMBULATORY_CARE_PROVIDER_SITE_OTHER): Payer: Medicare Other

## 2011-01-24 DIAGNOSIS — I4891 Unspecified atrial fibrillation: Secondary | ICD-10-CM

## 2011-01-24 DIAGNOSIS — Z7901 Long term (current) use of anticoagulants: Secondary | ICD-10-CM

## 2011-02-01 NOTE — Medication Information (Signed)
Summary: [facility]   Anticoagulant Therapy  Managed by: Weston Brass, PharmD Referring MD: Olga Millers MD Supervising MD: [name] MD, Arlys John Indication 1: Atrial Fibrillation (ICD-427.31) Lab Used: [facility] [place] Site: [address] INR POC 3.1 INR RANGE 2 - 3  Dietary changes: no    Health status changes: no    Bleeding/hemorrhagic complications: no    Recent/future hospitalizations: no    Any changes in medication regimen? no    Recent/future dental: no  Any missed doses?: no       Is patient compliant with meds? yes       Allergies: No Known Drug Allergies  Anticoagulation Management History:      The patient is taking warfarin and comes in today for a routine follow up visit.  Positive risk factors for bleeding include an age of 69 years or older.  The bleeding index is 'intermediate risk'.  Positive CHADS2 values include History of HTN.  Negative CHADS2 values include Age > 67 years old.  The start date was 07/31/2005.  His last INR was 2.2.  Anticoagulation responsible provider: [name] MD, Arlys John.  INR POC: 3.1.  Cuvette Lot#: [id number].  Exp: 12/2011.    Anticoagulation Management Assessment/Plan:      The patient's current anticoagulation dose is Coumadin 5 mg tabs: take as directed.  The target INR is 2 - 3.  The next INR is due 02/21/2011.  Anticoagulation instructions were given to patient.  Results were reviewed/authorized by Weston Brass, PharmD.  He was notified by Margot Chimes PharmD Candidate.         Prior Anticoagulation Instructions: INR 2.5 (goal INR: 2-3)  Take 1 and 1/2 tablets on Sundays, Tuesdays, Thursdays, and Saturdays and 1 tablet on Mondays, Wednesdays, and Fridays.  Recheck in 4 weeks.  Current Anticoagulation Instructions: INR 3.1   Take 1 tablet today then resume your normal schedule of 1 and 1/2 tablets everyday except on Mondays, Wednesdays, and Fridays when you only take 1 tablet.  Recheck INR In 4 weeks.

## 2011-02-17 ENCOUNTER — Encounter: Payer: Self-pay | Admitting: Cardiology

## 2011-02-17 DIAGNOSIS — Z7901 Long term (current) use of anticoagulants: Secondary | ICD-10-CM

## 2011-02-17 DIAGNOSIS — I4891 Unspecified atrial fibrillation: Secondary | ICD-10-CM

## 2011-02-20 ENCOUNTER — Encounter: Payer: Self-pay | Admitting: Cardiology

## 2011-02-20 ENCOUNTER — Encounter (INDEPENDENT_AMBULATORY_CARE_PROVIDER_SITE_OTHER): Payer: Medicare Other

## 2011-02-20 DIAGNOSIS — Z7901 Long term (current) use of anticoagulants: Secondary | ICD-10-CM

## 2011-02-20 DIAGNOSIS — I4891 Unspecified atrial fibrillation: Secondary | ICD-10-CM

## 2011-02-21 DIAGNOSIS — Z7901 Long term (current) use of anticoagulants: Secondary | ICD-10-CM | POA: Insufficient documentation

## 2011-02-28 NOTE — Medication Information (Signed)
Summary: rov/pc/rs per pt call=mj      Allergies Added: NKDA Anticoagulant Therapy  Managed by: Samantha Crimes, PharmD Referring MD: Olga Millers MD Supervising MD: Antoine Poche MD, Fayrene Fearing Indication 1: Atrial Fibrillation (ICD-427.31) Lab Used: [facility] [place] Site: [address] INR POC 2.6 INR RANGE 2 - 3  Dietary changes: no    Health status changes: no    Bleeding/hemorrhagic complications: no    Recent/future hospitalizations: no    Any changes in medication regimen? no    Recent/future dental: no  Any missed doses?: no       Is patient compliant with meds? yes       Current Medications (verified): 1)  Niaspan 500 Mg Cr-Tabs (Niacin (Antihyperlipidemic)) .... 3 Tab At Bedtime 2)  Klor-Con M20 20 Meq Cr-Tabs (Potassium Chloride Crys Cr) .... 2 Tab Three Times A Day 3)  Amlodipine Besylate 10 Mg Tabs (Amlodipine Besylate) .[name].. 1 Tab Once Daily 4)  Pacerone 200 Mg Tabs (Amiodarone Hcl) .[name].. 1 Tab Once Daily 5)  Lisinopril 40 Mg Tabs (Lisinopril) .[name].. 1 Tab Once Daily 6)  Pravastatin Sodium 80 Mg Tabs (Pravastatin Sodium) .[name].. 1 Tab At Bedtime 7)  Furosemide 40 Mg Tabs (Furosemide) .[name].. 1 Tab Once Daily 8)  Aspirin 81 Mg Tbec (Aspirin) .... Take One Tablet By Mouth Daily 9)  Metoprolol Succinate 100 Mg Xr24h-Tab (Metoprolol Succinate) .... 1/2 Tab Once Daily 10)  Coumadin 5 Mg Tabs (Warfarin Sodium) .... Take As Directed 11)  Doxazosin Mesylate 8 Mg Tabs (Doxazosin Mesylate) .[name].. 1 Tab Once Daily 12)  Multivitamins   Tabs (Multiple Vitamin) .[name].. 1 Tab Once Daily 13)  Nitroglycerin 0.4 Mg Subl (Nitroglycerin) .... One Tablet Under Tongue Every 5 Minutes As Needed For Chest Pain---May Repeat Times Three 14)  Spiriva Handihaler 18 Mcg Caps (Tiotropium Bromide Monohydrate) .... Use As Needed 15)  Hydralazine Hcl 25 Mg Tabs (Hydralazine Hcl) .[name].. 1 Tab By Mouth Three Times A Day  Allergies (verified): No Known Drug Allergies  Anticoagulation Management  History:      Positive risk factors for bleeding include an age of 69 years or older.  The bleeding index is 'intermediate risk'.  Positive CHADS2 values include History of HTN.  Negative CHADS2 values include Age > 69 years old.  The start date was 07/31/2005.  His last INR was 2.2.  Anticoagulation responsible provider: Antoine Poche MD, Fayrene Fearing.  INR POC: 2.6.  Exp: 12/2011.    Anticoagulation Management Assessment/Plan:      The patient's current anticoagulation dose is Coumadin 5 mg tabs: take as directed.  The target INR is 2 - 3.  The next INR is due 03/20/2011.  Anticoagulation instructions were given to patient.  Results were reviewed/authorized by Samantha Crimes, PharmD.         Prior Anticoagulation Instructions: INR 3.1   Take 1 tablet today then resume your normal schedule of 1 and 1/2 tablets everyday except on Mondays, Wednesdays, and Fridays when you only take 1 tablet.  Recheck INR In 4 weeks.  Current Anticoagulation Instructions: Cont with current regimen Return to clinic on 4 weeks on 4/9 @ 1000

## 2011-03-20 ENCOUNTER — Encounter: Payer: Medicare Other | Admitting: *Deleted

## 2011-03-24 ENCOUNTER — Ambulatory Visit (INDEPENDENT_AMBULATORY_CARE_PROVIDER_SITE_OTHER): Payer: Medicare Other | Admitting: *Deleted

## 2011-03-24 DIAGNOSIS — I4891 Unspecified atrial fibrillation: Secondary | ICD-10-CM

## 2011-03-24 DIAGNOSIS — Z7901 Long term (current) use of anticoagulants: Secondary | ICD-10-CM

## 2011-03-24 LAB — POCT INR: INR: 1.8

## 2011-04-17 ENCOUNTER — Encounter: Payer: Medicare Other | Admitting: *Deleted

## 2011-04-19 ENCOUNTER — Ambulatory Visit (INDEPENDENT_AMBULATORY_CARE_PROVIDER_SITE_OTHER): Payer: Medicare Other | Admitting: *Deleted

## 2011-04-19 DIAGNOSIS — I4891 Unspecified atrial fibrillation: Secondary | ICD-10-CM

## 2011-04-19 DIAGNOSIS — Z7901 Long term (current) use of anticoagulants: Secondary | ICD-10-CM

## 2011-04-19 LAB — POCT INR: INR: 2.5

## 2011-04-25 NOTE — Assessment & Plan Note (Signed)
[facility]                            CARDIOLOGY OFFICE NOTE   NAME:[name] [name], [name] [name]                    MRN:          [id number]  DATE:04/12/2007                            DOB:          05/26/1941    [name] [name] is a very pleasant gentleman who has a history of coronary  disease, cardiomyopathy, and COPD.  His last catheterization was  performed in October 2007.  He was found to have patent grafts and an  ejection fraction of 45%.  He was found to have COPD at that time as  well.  Note, he discontinued his tobacco use at that time, and has not  smoked in 6 months.  He does have some dyspnea on exertion, but there is  no orthopnea, PND, pedal edema, palpitations, presyncope, syncope, or  chest pain.   His medications include:  1. Lasix 40 mg p.o. b.i.d.  2. Digoxin 0.125 mg p.o. daily.  3. Lisinopril 40 mg p.o. daily.  4. Niaspan 1 gm p.o. nightly.  5. Doxazosin 8 mg p.o. daily.  6. Coumadin as directed.  7. Potassium 20 mEq p.o. daily.  8. Lipitor 40 mg p.o. daily.  9. Aspirin 81 mg p.o. daily.  10.Multivitamin.  11.Amiodarone 200 mg p.o. daily.  12.Chantix.  13.Spiriva.  14.Xopenex.  15.Toprol 50 mg p.o. b.i.d.   PHYSICAL EXAMINATION:  Shows a blood pressure of 134/72, and his pulse  is 55.  He weighs 263 pounds.  NECK:  Supple.  CHEST:  Clear.  CARDIOVASCULAR EXAM:  Regular rate and rhythm.  EXTREMITIES:  Show no edema.  His electrocardiogram shows a sinus rhythm at a rate of 55.  The axis is  normal.  There is marked anterior T wave inversion, and prolonged QT.  Note, his previous electrocardiograms have revealed a left bundle branch  block.   DIAGNOSES:  1. Coronary artery disease status post coronary artery bypass and      graft.  He will continue his medical therapy including his aspirin,      statin, beta blocker, and ACE inhibitor.  His catheterization in [place]      2007 showed patent grafts.  2. Ischemic cardiomyopathy.  He  will continue with his beta blocker,      as well as his ACE inhibitor.  He is also on digoxin.  3. History of atrial fibrillation.  He is status post cardioversion.      We will continue with his amiodarone, and we will check screening      laboratories today, including TSH, as well as liver function, as      well as chest x-ray.  He will also continue on Coumadin with a goal      INR of 2 to 3.  4. Coumadin therapy.  5. History of mitral regurgitation, improved by most recent      echocardiogram.  6. History of aneurysmal dilatation of the iliac.  We will plan to      repeat his lower extremity Dopplers.  7. Hypertension.  His blood pressure is well controlled on his present      medications.  8. Hyperlipidemia.  We will check lipids and liver today, and adjust      as indicated.  9. Tobacco abuse, now resolved.  10.Risk factor modification.  I have discussed the importance of diet      and exercise.   We will see him back in 6 months.     Madolyn Frieze [name], MD, [facility]  Electronically Signed    BSC/MedQ  DD: 04/12/2007  DT: 04/12/2007  Job #: [id number]

## 2011-04-25 NOTE — Assessment & Plan Note (Signed)
[facility]                            CARDIOLOGY OFFICE NOTE   NAME:[name] [name] POMPEY                    MRN:          [id number]  DATE:03/19/2009                            DOB:          07-30-1941    [name] [name] returns for a followup today.  He has a history of coronary  artery disease, status post coronary artery bypassing graft, history of  ischemic cardiomyopathy, improved by most recent echocardiogram as well  as COPD.  He also has a history of focal aortic dissection being managed  medically.  His last echocardiogram was performed in May 2009 showed  normal LV function.  His last catheterization in October 2007 showed  patent grafts.  Note, he also has a left iliac artery aneurysm and mild  cerebrovascular disease with history of carotid endarterectomy.  I last  saw [name] [name] on December 16, 2008.  Note, the patient also has a  abdominal aortic aneurysm.  Since I last saw him, he is doing well from  symptomatic standpoint.  There is occasional dyspnea, which is  unchanged.  However, there is no orthopnea, PND, or pedal edema.  He has  not had chest pain, palpitations, or syncope.   MEDICATIONS:  1. Slo-Niacin 500 mg 3 p.o. at bedtime.  2. Potassium 20 mEq tablets 2 p.o. t.i.d.  3. Amlodipine 10 mg p.o. daily.  4. Amiodarone 200 mg p.o. daily.  5. Lisinopril 40 mg p.o. daily.  6. Pravachol 80 mg p.o. daily.  7. Lasix 40 mg p.o. daily.  8. Aspirin 81 mg p.o. daily.  9. Toprol 25 mg daily.  10.Coumadin as directed.  11.Doxazosin 8 mg p.o. daily.  12.Multivitamin.  13.Nitroglycerin.  14.Spiriva.   PHYSICAL EXAMINATION:  VITAL SIGNS:  Today, shows blood pressure of  129/72.  His pulse is 55.  He weighs 274 pounds.  HEENT:  Normal.  NECK:  Supple.  CHEST:  Clear.  CARDIOVASCULAR:  Regular rhythm.  ABDOMEN:  No tenderness.  EXTREMITIES:  No edema.   DIAGNOSES:  1. Recent focal dissection of the abdominal aorta, abdominal aortic  aneurysm, and iliac aneurysm - the patient was seen by the vascular      surgeons on January 13, 2009.  He was seen by Dr. Darrick Penna.  They      recommended a repeat ultrasound of his abdomen for his aorta and      iliacs in 3 months.  He is scheduled to see Dr. Madilyn Fireman for this      particular issue.  They will also plan to repeat his carotid      Dopplers.  2. Hypertension - his blood pressure is controlled on his present      medications.  His last BMET in January showed his potassium is 3.7.  3. Coronary artery disease, status post coronary bypassing graft - the      patient will continue on his aspirin, statin, beta-blocker, and ACE      inhibitor.  When I see him back in 6 months, we will most likely      repeat his Myoview at that time.  4. History of low back pain.  5. Ischemic cardiomyopathy - improved on his most recent      echocardiogram.  6. History of paroxysmal atrial fibrillation - the patient remains in      sinus rhythm.  He will continue on his amiodarone as well as      Coumadin with goal INR of 2-3.  7. Coumadin therapy - this is being followed in our Coumadin Clinic.  8. Amiodarone therapy - he did have recent liver functions and TSH.  I      will check his chest x-ray today.  9. History of left lung nodule - we will plan to repeat his chest CT      in November.  10.Remote history of tobacco abuse - the patient has not smoked in 2      years.  11.History of mild cerebrovascular disease - this will now be followed      by  CVTS surgeons.   We will see him back in 6 months.     Madolyn Frieze [name] Som, MD, [facility]  Electronically Signed    BSC/MedQ  DD: 03/19/2009  DT: 03/20/2009  Job #: [id number]

## 2011-04-25 NOTE — Procedures (Signed)
CAROTID DUPLEX EXAM   INDICATION:  Follow up carotid artery disease.   HISTORY:  Diabetes:  No.  Cardiac:  CABG.  Hypertension:  Yes.  Smoking:  Previous.  Previous Surgery:  Left CEA in 2006.  CV History:  No.  Amaurosis Fugax No, Paresthesias No, Hemiparesis No.                                       RIGHT             LEFT  Brachial systolic pressure:         142               132  Brachial Doppler waveforms:         WNL               WNL  Vertebral direction of flow:        Antegrade         Antegrade  DUPLEX VELOCITIES (cm/sec)  CCA peak systolic                   67                88  ECA peak systolic                   95                95  ICA peak systolic                   52                90  ICA end diastolic                   16                20  PLAQUE MORPHOLOGY:                  Calcified         N/A  PLAQUE AMOUNT:                      Mild              N/A  PLAQUE LOCATION:                    ICA/bifurcation   N/A   IMPRESSION:  1. Right internal carotid artery shows evidence of 1-39% stenosis.  2. Left internal carotid artery shows no evidence of restenosis,      status post carotid endarterectomy.       ___________________________________________  P. Liliane Bade, M.D.   AS/MEDQ  D:  04/08/2009  T:  04/08/2009  Job:  [id number]

## 2011-04-25 NOTE — Procedures (Signed)
DUPLEX ULTRASOUND OF ABDOMINAL AORTA   INDICATION:  Abdominal aortic aneurysm.   HISTORY:  Diabetes:  No.  Cardiac:  CABG.  Hypertension:  Yes.  Smoking:  Previous.  Connective Tissue Disorder:  Family History:  No.  Previous Surgery:  No.   DUPLEX EXAM:         AP (cm)                   TRANSVERSE (cm)  Proximal             2.99 cm                   2.92 cm  Mid                  Not visualized            Not visualized  Distal               4.0 cm                    3.8 cm  Right Iliac          Not visualized            Not visualized  Left Iliac           Not visualized            Not visualized   PREVIOUS:  Date: 10/08/2009 (CT)  AP:  3.9  TRANSVERSE:  3.4   IMPRESSION:  Aneurysmal dilatation of the distal abdominal aorta noted  with no significant change in the maximum diameter when compared to the  previous CT, based on very limited visualization.  The previous CT noted  a focal dissection of the infrarenal aorta and a 2.5 X 2.2 cm left  common iliac artery aneurysm, which were not visualized during this  examination during overlying bowel gas patterns and patient body  habitus.   ___________________________________________  [name], M.D.   CH/MEDQ  D:  11/24/2010  T:  11/24/2010  Job:  [id number]

## 2011-04-25 NOTE — Procedures (Signed)
DUPLEX ULTRASOUND OF ABDOMINAL AORTA   INDICATION:  Follow-up abdominal aortic aneurysm and left common iliac  artery aneurysm.   HISTORY:  Diabetes:  No.  Cardiac:  CABG.  Hypertension:  Yes.  Smoking:  Previous.  Connective Tissue Disorder:  Family History:  No.  Previous Surgery:  No.   DUPLEX EXAM:         AP (cm)                   TRANSVERSE (cm)  Proximal             2.65 cm                   2.79 cm  Mid                  3.8 cm                    4.0 cm  Distal               2.76 cm                   2.73 cm  Right Iliac          1.36 cm                   1.64 cm  Left Iliac           2.17 cm                   2.13 cm   PREVIOUS:  Date: 09/2008 (CT)  AP:  4  TRANSVERSE:   IMPRESSION:  1. Stable abdominal aortic aneurysm with largest measurement of 3.8 cm      X 4.0 cm.  2. Stable left common iliac artery aneurysm measuring 2.17 cm X 2.13      cm.  3. Evidence of possible dissection in aorta and left common iliac      artery, as noted in CT; however, technically difficult to visualize      well due to body habitus, bowel gas, and vessel depth.   ___________________________________________  P. Liliane Bade, M.D.   AS/MEDQ  D:  04/08/2009  T:  04/08/2009  Job:  [id number]

## 2011-04-25 NOTE — Assessment & Plan Note (Signed)
OFFICE VISIT   [name] [name], [name] [name]  DOB:  25-Apr-1941                                       01/13/2009  ZOXWR#:[id number]   The patient is a 70 year old male whom I saw in October of 2009 in the  emergency room for evaluation of abdominal aortic aneurysm and common  iliac artery aneurysm.  He had left-sided flank pain at that time.  He  still continues to have intermittent left-sided back and flank pain over  the last 2-3 months.  He denies any chest pain.  He has not had a repeat  CT scan since October of 2009.  He previously underwent left carotid  endarterectomy by Dr. Madilyn Fireman in 2006.  He denies family history of  abdominal aortic aneurysm.   PAST MEDICAL HISTORY:  Significant for hypertension, elevated  cholesterol and congestive heart failure.   MEDICATIONS:  1. Are extensive and include furosemide 40 mg twice a day.  2. Hydralazine 25 mg three times a day.  3. Klor-Con 20 mEq 2 tablets three times a day.  4. Niaspan 500 mg three q.h.s.  5. Pravastatin 40 mg two q.h.s.  6. Warfarin 5 mg once a day.  7. Pacerone 200 mg once a day.  8. Amlodipine 10 mg once a day.  9. Metoprolol 100 mg once a day.  10.Doxazosin 8 mg once a day.  11.Lisinopril 40 mg once a day.   ALLERGIES:  He has no known drug allergies.   PAST SURGICAL HISTORY:  A coronary artery bypass grafting in 1998.  He  had a left carotid endarterectomy as mentioned above.   FAMILY HISTORY:  Unremarkable.   SOCIAL HISTORY:  He is married.  He has seven children.  He is a  nonsmoker and quit in September of 2008.  He does not consume alcohol  regularly.   REVIEW OF SYSTEMS:  CONSTITUTIONAL:  He is 6 feet 1 inch, 263 pounds.  Cardiac, pulmonary, GI, renal, vascular, neurologic, orthopedic,  psychiatric, ENT and hematologic review of systems are all negative.   PHYSICAL EXAM:  Vital signs:  Blood pressure is 144/79 in the left arm,  heart rate 63 and regular.  HEENT:  Unremarkable.   Neck:  He has a well-  healed left neck scar.  He has no carotid bruits.  Chest:  Clear to  auscultation.  Cardiac:  Exam is regular rate and rhythm.  Abdomen:  Is  obese, soft, nontender, nondistended.  No palpable mass.  Extremities:  He has 2+ radial pulses bilaterally.  He has 1+ femoral pulses  bilaterally.  He has absent popliteal and pedal pulses bilaterally.   CT scan from October is reviewed which shows a 4 cm abdominal aortic  aneurysm and a 2 cm left common iliac artery aneurysm.  There is no  evidence of rupture.   I had a lengthy discussion today with the patient regarding his  aneurysm.  It has only been 3 months since his last scan and I do not  believe that he needs a repeat scan at this early time.  I believe the  best option would be to repeat the ultrasound of his aorta and iliacs in  3 months' time.  He will continue to monitor his blood pressure and try  to maintain good control of this.  If his aneurysm continues to grow in  the left common iliac or abdominal aorta to the threshold of greater  than 2 cm in the iliac or greater than 5 cm in the abdominal aorta we  would consider repair at that time.  I did discuss with the patient  today that his risk of rupture is extremely low at the aneurysm size  that he currently has.  We also discussed risk versus benefit of  operation versus observation at this time.  He will follow up with Dr.  Madilyn Fireman in 3 months' time since Dr. Madilyn Fireman has previously done his carotid  procedure.  He will have an ultrasound at that time of his carotids as  well as his abdominal aorta.   Janetta Hora. Fields, MD  Electronically Signed   CEF/MEDQ  D:  01/14/2009  T:  01/14/2009  Job:  [id number]   cc:   Madolyn Frieze. [name], MD, [facility]  [name], MD

## 2011-04-25 NOTE — Assessment & Plan Note (Signed)
[facility]                            CARDIOLOGY OFFICE NOTE   NAME:Heiney, ALDRIDGE KRZYZANOWSKI                    MRN:          [id number]  DATE:12/16/2008                            DOB:          22-Jul-1941    Mr. Rojek is a pleasant gentleman who has a history of coronary artery  disease, ischemic cardiomyopathy improved by most recent echocardiogram  as well as COPD.  He also has a focal dissection in his aorta that is  being treated medically.  Please refer to my note of October 07, 2008,  for details.  Since I last saw him, he has mild dyspnea on exertion  which is unchanged.  There is no orthopnea, PND, pedal edema,  palpitations, presyncope, syncope, or exertional chest pain.  He has not  had claudication or leg pain.  The pain in his back has improved.   MEDICATIONS:  1. Niaspan 1500 mg p.o. daily.  2. Potassium 20 mEq p.o. t.i.d.  3. Amlodipine 10 mg p.o. daily.  4. Pacerone 200 mg p.o. daily.  5. Lisinopril 40 mg p.o. daily.  6. Pravachol 80 mg p.o. daily.  7. Multivitamin.  8. Lasix 40 mg p.o. daily.  9. Aspirin 81 mg p.o. daily.  10.Toprol 50 mg p.o. daily.  11.Coumadin as directed.  12.Doxazosin 8 mg p.o. daily.  13.Hydralazine 25 mg p.o. t.i.d.   PHYSICAL EXAMINATION:  VITAL SIGNS:  Today shows a blood pressure of  133/73 and his pulse is 69.  HEENT:  Normal.  NECK:  Supple.  CHEST:  Clear.  CARDIOVASCULAR:  Regular rate and rhythm.  ABDOMEN:  No tenderness.  EXTREMITIES:  No edema.  He does have 2+ dorsalis pedis pulses  bilaterally.   DIAGNOSES:  1. Recent focal dissection of the abdominal aorta - The patient is not      having any abdominal pain or leg pain.  There is no claudication.      We will continue to treat medically for blood pressure control.  He      will also continue on his aspirin and statin.  In 6 months when he      returns, I will have him proceed with an abdominal ultrasound to      reassess his aneurysm.  2. Hypertension - His blood pressure is much improved.  We will check      a BMET to follow his potassium and renal function.  3. Coronary artery disease status post coronary artery bypassing graft      - He will continue on his aspirin, statin, beta-blocker, and ACE      inhibitor.  4. History of low back pain - This is improved.  5. Ischemic cardiomyopathy - This is improved on his most recent echo.  6. History of paroxysmal fibrillation - He will continue on his      amiodarone as well as his Coumadin with a goal INR of 2-3.  7. Coumadin therapy - He will continued to be followed in our Coumadin      Clinic.  We will check a CBC to follow his hemoglobin and  hematocrit.  8. History of iliac artery aneurysm - We will plan to do followup      Dopplers in 6 months when I have him return.  9. History of left lung nodule - This was much less apparent on recent      CT in November.  We will plan to repeat this in 1 year.  10.Remote history of tobacco abuse.  11.History of mild cerebrovascular disease - He will need followup      carotid Dopplers in August 2011.     Madolyn Frieze [name], MD, [facility]  Electronically Signed    BSC/MedQ  DD: 12/16/2008  DT: 12/17/2008  Job #: [id number]   cc:   [name], MD

## 2011-04-25 NOTE — Assessment & Plan Note (Signed)
HEALTHCARE                            CARDIOLOGY OFFICE NOTE   NAME:Febres, ABAYOMI PATTISON                    MRN:          [id number]  DATE:09/25/2008                            DOB:          1941-01-22    Mr. Cena is a very pleasant 70 year old gentleman who I have seen for  coronary artery disease, history of ischemic cardiomyopathy improved, as  well as COPD.  He also has peripheral vascular disease.  When I last saw  him, we scheduled him to have an echocardiogram which was performed on  Apr 10, 2008.  His LV function was normal with estimated ejection  fraction of 55%.  There are no significant valvular abnormalities noted.  His last catheterization was performed in October 2007, that showed  patent grafts.  He also has a left iliac artery aneurysm that we are  following and mild cerebrovascular disease.  Since I last saw him he is  having dyspnea on exertion.  However, this has been a chronic issue and  unchanged since I saw him in April.  There is no associated chest pain  with this nor palpitations, fevers, chills, or productive cough.  It  occurs only with exertion, but not at rest.  There is no orthopnea or  PND.  Occasionally has mild pedal edema.  He has not had chest pain,  palpitations or syncope.   MEDICATIONS:  1. Niaspan 59 mg p.o. nightly.  2. Potassium 20 mEq p.o. t.i.d., amlodipine 10 mg p.o. daily.  3. Amiodarone 200 mg p.o. daily.  4. Toprol 50 mg p.o. daily.  5. He also takes Lasix 40 mg p.o. b.i.d.  6. Lisinopril 40 mg p.o. daily.  7. Doxazosin 8 mg p.o. daily.  8. Coumadin as directed.  9. Aspirin 81 mg p.o. daily.  10.Multivitamin.  11.Spiriva.  12.Pravachol 80 mg p.o. daily.   PHYSICAL EXAMINATION:  VITAL SIGNS:  Blood pressure of 165/80, but on  recheck it was 145/70.  His pulse was 59.  GENERAL:  He is well-developed and well-nourished.  HEENT:  Normal.  NECK:  Supple.  CHEST:  Clear.  CARDIOVASCULAR:  Regular  rate and rhythm.  ABDOMEN:  No tenderness.  EXTREMITIES:  No edema.  His electrocardiogram shows sinus rhythm at a  rate of 59.  There is a left bundle branch block.   DIAGNOSES:  1. Coronary artery disease status post coronary bypass graft - Mr.      Clucas is doing well with no chest pain.  We will continue with      medical therapy including his beta-blocker, aspirin, statin, and      ACE inhibitor.  2. Dyspnea - this is most likely secondary to deconditioning, obesity,      and chronic obstructive pulmonary disease.  3. Ischemic cardiomyopathy - this has improved on his most recent      echocardiogram.  4. History of paroxysmal atrial fibrillation - he remains in sinus      rhythm and we will continue on his amiodarone.  We will check      surveillance laboratories today including his TSH, liver  functions,      and chest x-ray.  He also continue on Coumadin with goal INR of 2-      3.  5. Coumadin therapy - this is being monitored at our [facility]      with goal INR of 2-3.  6. Amiodarone therapy - as per above.  7. History of left iliac artery aneurysm - we will plan to repeat his      Dopplers to reassess this.  8. Hypertension - his blood pressure appears to be adequately control.      We will check a BMET for his potassium and renal function.  9.      Hyperlipidemia - We will check lipids and liver and he will      continue on statin.  9. Remote history of tobacco abuse.  10.History of mild cerebrovascular disease - he will need followup      carotid Dopplers in August 2011.   We will see him back in 6 months.     Madolyn Frieze [name], MD, [facility]  Electronically Signed    BSC/MedQ  DD: 09/25/2008  DT: 09/26/2008  Job #: [id number]

## 2011-04-25 NOTE — Assessment & Plan Note (Signed)
OFFICE VISIT   RUDELL, ORTMAN  DOB:  [birth year]                                       04/08/2009  ZOXWR#:[id number]   The patient was last seen in the office by Dr. Darrick Penna in February of  this year.  At that time he was noted to have an abdominal aortic  aneurysm and left common iliac aneurysm.  His AAA measured 4.2 cm and  his left common iliac artery 2 cm by CT scan.   On return today, he underwent an ultrasound, his abdominal aorta  measures 4.0 cm and left common iliac artery measures 2.1 cm.  These  measurements are consistent with previous CT scan.  No significant  change.  The patient is experiencing no significant abdominal pain or  back pain.   His blood pressure is 111/66 in the left arm, 124/75 right arm, pulse is  61 per minute.  His abdomen reveals mild obesity.  Nontender without  masses or organomegaly.  Intact femoral pulses bilaterally.   The patient has stable aneurysmal measurements in the abdominal aorta at  4 cm and left common iliac artery 2.1 cm.  We will plan follow-up with  him again in 6 months with a CTA of the abdomen and pelvis.   Balinda Quails, M.D.  Electronically Signed   PGH/MEDQ  D:  04/08/2009  T:  04/09/2009  Job:  [id number]   cc:   Madolyn Frieze. [name], MD, [facility]  [name], MD

## 2011-04-25 NOTE — Assessment & Plan Note (Signed)
[facility]                            CARDIOLOGY OFFICE NOTE   NAME:Urbieta, MEER REINDL                    MRN:          [id number]  DATE:04/08/2008                            DOB:          1941/05/14    HISTORY:  Mr. Scheiber is a very pleasant gentleman who has a history of  coronary disease, ischemic cardiomyopathy and COPD.  His most recent  catheterization in October 2007 showed patent grafts and an ejection  fraction of 45%.  Since I last saw him, he is doing well from a  symptomatic standpoint.  There is dyspnea with more extreme activities,  but not with routine activities.  There is no orthopnea, PND, pedal  edema, palpitations, presyncope, syncope or chest pain.  Note, he has  discontinued his tobacco use now for 1 year.  He is not exercising  routinely.   MEDICATIONS:  1. Niaspan 1500 mg p.o. q.h.s.  2. Potassium 20 mEq p.o. t.i.d.  3. Amlodipine 10 mg p.o. daily.  4. Pacerone 200 mg p.o. daily.  5. Toprol 50 mg p.o. daily.  6. Lasix 40 mg p.o. b.i.d.  7. Digoxin 0.5 mg p.o. daily.  8. Lisinopril 40 mg p.o. daily.  9. Doxazosin 8 mg p.o. q.h.s.  10.Coumadin as directed.  11.Aspirin 81 mg p.o. daily.  12.Multivitamin.  13.Amiodarone 200 mg p.o. daily.  14.Spiriva.  15.Pravachol 80 mg p.o. daily.   PHYSICAL EXAMINATION:  VITAL SIGNS:  Blood pressure of 121/71, pulse is  56.  He weighs 268 pounds.  HEENT:  Normal.  NECK:  Supple.  CHEST:  Clear.  CARDIOVASCULAR:  Bradycardic rate, but a regular rhythm.  I cannot  appreciate murmurs, although his heart sounds are distant.  ABDOMEN:  Shows no tenderness.  EXTREMITIES:  Show no edema.   DIAGNOSTICS:  His electrocardiogram shows a marked sinus bradycardia at  a rate of 48.  There is a left bundle branch block.   DIAGNOSES:  1. Coronary disease status post coronary artery bypassing graft  - Mr.      Duford has not had chest pain or shortness of breath.  We will      continue with  medical therapy including his Toprol, aspirin, statin      and ACE inhibitor.  2. Ischemic cardiomyopathy - he will continue on his beta blocker and      ACE inhibitor.  We will discontinue his digoxin as his heart rate      is somewhat slow.  3. Paroxysmal atrial fibrillation - he remains in sinus rhythm.  He      will continue on his amiodarone.  We will check surveillance labs      today including liver functions, TSH and chest x-ray.  He will      continue on his Coumadin with a goal INR of 2-3.  4. Coumadin therapy - this is being monitored in our Coumadin Clinic.  5. History of mitral regurgitation cardiomyopathy - we will plan to      repeat his echocardiogram.  6. Aneurysmal dilatation of the iliac - he will need follow up  Dopplers in November 2009.  7. Hypertension - his blood pressure is adequately controlled.  I will      check a BMET, follow his potassium and renal function.  8. Hyperlipidemia - we will check lipids and liver and adjust as      indicated.  9. Tobacco abuse, now resolved.   DISCHARGE INSTRUCTIONS:  We discussed the importance of diet and  exercise.  I will see him back in 6 months.     Madolyn Frieze [name], MD, [facility]  Electronically Signed    BSC/MedQ  DD: 04/08/2008  DT: 04/08/2008  Job #: [id number]

## 2011-04-25 NOTE — Assessment & Plan Note (Signed)
[facility]                             PULMONARY OFFICE NOTE   NAME:[name] [name], [name] [name]                    MRN:          [id number]  DATE:04/12/2007                            DOB:          22-Mar-1941    I met [name] with his wife today in followup for his moderate COPD  and dyspnea as well as tobacco abuse.   He says that he ran out of his Spiriva and Xopenex approximately 2  months ago. He says that he never actually got around to calling our  office to get these prescriptions refilled. He said that he was not sure  how much of a difference these inhalers were making although it is  difficult to tell how regular he was using them. He says he has bought  Publishing rights manager over-the-counter which he said seemed to help as much as  the other inhalers. He still has symptoms of dyspnea. He also complains  of having nasal congestion with a post nasal drip as well as a sensation  like he needs to clear his throat as well as a globus sensation.   PHYSICAL EXAMINATION:  VITAL SIGNS:  He is 260 pounds, temperature is  97.6, blood pressure is 118/62. Heart rate is 57, oxygen saturation 95%  on room air.  HEENT:  There is no sinus tenderness. He has clear nasal discharge. He  has a septa deviation to the left. He has mild erythema in the posterior  pharynx. There is no lymphadenopathy.  HEART:  S1, S2.  CHEST:  No wheezing but he had a prolonged expiratory phase.  ABDOMEN:  Obese, soft, nontender.  EXTREMITIES:  No edema.   IMPRESSION:  1. Moderate chronic obstructive pulmonary disease. I will try him on      Symbicort 160/4.5 two puffs b.i.d. as well as continuing him on      Xopenex HFA 2 puffs q.i.d. p.r.n. to see if he gains any      symptomatic benefit from this.  2. Dyspnea on exertion. Again this is likely related to his obesity      and deconditioning. He had been advised earlier in the day by Dr.      [name] to initiate an exercise program and  I have reemphasized      this. I had also given him the option of referring him to      cardiopulmonary rehab but he says he would prefer to start an      exercise program on his own.  3. Chronic rhinitis post nasal drip with nasal septal deviation. I      have given him a sample of Nasonex and showed him how to use this.      I have also advised him to try using nasal irrigation again.  4. Tobacco abuse. He says he has been off of cigarettes for the last      several months and I have encouraged him to maintain his smoking      cessation program.   I will followup with him in approximately 4 months.     Vineet  Craige Cotta, MD  Electronically Signed    VS/MedQ  DD: 04/12/2007  DT: 04/12/2007  Job #: [id number]   cc:   Madolyn Frieze. [name], MD, [facility]

## 2011-04-25 NOTE — Assessment & Plan Note (Signed)
OFFICE VISIT   [name] [name], [name] [name]  DOB:  1941-05-10                                       10/08/2009  ZOXWR#:[id number]   Patient presents today for continued follow-up of his abdominal aortic  aneurysm and iliac artery aneurysm.  This has been followed in our  office by Dr. Liliane Bade, and I discussed this with patient, as Dr.  Madilyn Fireman has left our town.  He is here today with a CT angiogram for  follow-up of an ultrasound that he had 6 months ago.  He denies any new  medical difficulties.  He is status post coronary bypass grafting and  coronary stenting.  He has hypertension, history congestive heart  failure, COPD.   Family history is negative for premature atherosclerotic disease.   SOCIAL HISTORY:  He is married with 3 children.  He quit smoking in  2006.  He does not drink alcohol on a regular basis.   Review of systems is positive for arrhythmias, shortness of breath with  exertion, otherwise negative.   He does have a history of prior left carotid endarterectomy with Dr.  Madilyn Fireman in 2005.   PHYSICAL EXAMINATION:  Blood pressure 132/75, pulse 49, respirations 18,  temperature 97.9.  On physical exam, he has moderate obesity.  His left  neck incision is well-healed without bruits bilaterally.  He is grossly  intact neurologically.  Radial pulses are 2+.  He has palpable femoral  pulses.  I do not feel any abdominal masses, and he does not have any  tenderness.   I did review his CAT scan and discussed this with patient.  This shows  no change in his 4 cm abdominal and 2.5 cm left common iliac artery  aneurysm.  I had a long discussion regarding signs of leaking aneurysm  and explained this would be quite unlikely with the small size that he  has.  I have recommended continued observation only.  I plan to see him  again in 1 year with repeat ultrasound at that time.   [name], M.D.  Electronically Signed   TFE/MEDQ  D:  10/08/2009  T:   10/12/2009  Job:  [id number]

## 2011-04-25 NOTE — Assessment & Plan Note (Signed)
[facility]                            CARDIOLOGY OFFICE NOTE   NAME:[name] [name], [name] [name]                    MRN:          [id number]  DATE:10/07/2008                            DOB:          1941-07-01    Mr. Vandyne is a pleasant 70 year old gentleman that I have seen for  coronary artery disease, history of ischemic cardiomyopathy improved as  well as COPD.  I last saw him on September 25, 2008.  He was recently  admitted to [facility] with complaints of the left flank pain.  There was initial concern that he had kidney stones.  However, a CT of  his abdomen showed dissection of the abdominal aorta in 2 locations.  There is a focal dissection in between the renal arteries and the  inferior mesenteric artery and a small dissection involving the left  common iliac.  Dr. Tenny Craw saw the patient in my absence for blood pressure  questions.  I have also reviewed the consultation of Dr. Fabienne Bruns,  the vascular surgeon.  Note, the patient's CT also showed a 4.2-cm  infrarenal abdominal aortic aneurysm and 2-cm left common iliac  aneurysm.  I also note that the CTA did show a 7-mm density in the left  major fissure of his lung and the followup was recommended.  Since  discharge, the patient has done reasonably well.  He is having continued  pain in his left back area.  This increases with certain movements and  he is also tender in that location.  It is unchanged since discharge.  He is not complaining of leg pain.  He denies any chest pain, shortness  of breath, or syncope.  Note, his renal function in the hospital was  normal.   MEDICATIONS:  1. Niaspan 1500 mg p.o. nightly.  2. Potassium 10 mEq p.o. t.i.d.  3. Amlodipine 10 mg p.o. daily.  4. Pacerone 200 mg daily.  5. Lisinopril 40 mg p.o. daily.  6. Pravachol 80 mg p.o. daily.  7. Multivitamin.  8. Lasix 40 mg p.o. daily.  9. Aspirin 81 mg p.o. daily.  10.Toprol 50 mg p.o. daily.  11.Coumadin as directed.  12.Doxazosin 8 mg p.o. daily.   PHYSICAL EXAMINATION:  GENERAL:  Today, shows a blood pressure that is  slightly elevated at 146/73 and his pulse is 58.  HEENT:  Normal.  NECK:  Supple.  CHEST:  Clear.  CARDIOVASCULAR:  Regular rate and rhythm.  ABDOMEN:  Shows no tenderness.  He is somewhat tender over the left  flank area particularly in the musculoskeletal area.  He has 1+ femoral  pulses bilaterally.  EXTREMITIES:  Show trace edema.  He has 1+ dorsalis pedis pulses  bilaterally.   DIAGNOSES:  1. Recent focal dissection of the abdominal aorta - the patient is not      having any pain in his legs or claudication.  We are treating this      medically.  His blood pressure is not optimally controlled and I      will add hydralazine 25 mg p.o. t.i.d.  We will increase this as  needed.  I will ask him to see Dr. Darrick Penna back in approximately 3      months as well.  Note, he does have a 4.2-cm aneurysm and he will      need a followup ultrasound in approximately 6 months.  2. Hypertension - again, his blood pressure is elevated.  We will add      hydralazine 25 mg p.o. t.i.d.  3. Coronary artery disease, status post coronary artery bypassing      graft - he will continue on his aspirin, statin, beta-blocker, and      ACE inhibitor.  4. Low back pain - this may be musculoskeletal.  If it persists, I      have asked him to follow up with Dr. Shary Decamp concerning this issue.  5. Ischemic cardiomyopathy - this is improved on his most recent      echocardiogram.  6. History paroxysmal atrial fibrillation - continue on his      amiodarone.  He also continue on Coumadin goal INR of 2-3.  7. Coumadin therapy - this is being monitored in our [facility]madin Clinic      and a goal INR is 2-3.  8. History of iliac artery aneurysm - we will do followup Dopplers in      the future.  9. Left lung nodule - he will need followup CT in 3 months.  10.Remote history of tobacco  abuse.  11.History of mild cerebrovascular disease - he will need followup      carotid Dopplers in August 2011.   I will see him back in approximately 3 months or sooner if necessary.     Madolyn Frieze [name], MD, [facility]  Electronically Signed    BSC/MedQ  DD: 10/07/2008  DT: 10/08/2008  Job #: [id number]   cc:   [name], MD

## 2011-04-25 NOTE — Consult Note (Signed)
NAME:  [name], [name]             ACCOUNT NO.:  [id number]   MEDICAL RECORD NO.:  [id number]          PATIENT TYPE:  INP   LOCATION:  5506                         FACILITY:  [facility]   PHYSICIAN:  Pricilla Riffle, MD, FACCDATE OF BIRTH:  1941-11-08   DATE OF CONSULTATION:  10/02/2008  DATE OF DISCHARGE:                                 CONSULTATION   IDENTIFICATION:  [name] is a 70 year old gentleman who we are asked  to follow regarding his blood pressure.   HISTORY OF PRESENT ILLNESS:  The patient has a history of hypertension,  coronary artery disease, atrial fibrillation, and dyslipidemia.  He also  has an aortic aneurysm that has been followed.  He presented today  complaining of left flank pain for several days.  He deferred coming in  because of the family crisis.  He felt he was passing kidney stones.  A  CT today showed dissection of the abdominal aorta in 2 locations.  Plan  is for medical management.  We are asked to see regarding his blood  pressure.  Of note, his Toprol was increased from Toprol-XL 50 one 1  time per day to Toprol-XL 100.   Currently, the patient denies chest pain, shortness of breath, PND, or  palpitations.  He does still complain of left flank pain.   ALLERGIES:  None.   HOME MEDICATIONS:  1. Amiodarone 200.  2. Norvasc 10.  3. Cardura 8.  4. Lasix 40 b.i.d.  5. Lisinopril 40.  6. Toprol-XL 50 b.i.d.,(?) daily.  7. Potassium 40 mEq t.i.d.  8. Coumadin as directed.  9. Niaspan 1 g nightly.  10.Pravastatin 80.   PAST MEDICAL HISTORY:  1. CAD status post CABG in 1998.  Last catheterization on October 03, 2006.  Left main normal.  LAD 100%, left circumflex 95%, RCA 100%,      LIMA to LAD patent, SVG to OM-2/OM-3 patent, and SVG to PDA patent.  2. Atrial fibrillation, on amiodarone and Coumadin, status post DC      cardioversion in December 2006 and January 2007.  3. Hypertension.  4. Hyperlipidemia.  5. History of tobacco use, remote.  6. Peptic ulcer disease.  7. Status post left CEA.  8. Abdominal aortic aneurysm.  9. COPD.  10.Nephrolithiasis.   Last echocardiogram in Apr 10, 2008, LVEF was 55% (it had been low  previously).   SOCIAL HISTORY:  The patient lives in [place].  He is married.  He is  a retired [employer] architect.  He has greater than 100-pack-a-year history of  smoking, has quit.  Denies alcohol.   FAMILY HISTORY:  Mother died of motor vehicle accident at 66.  Father  died of motor vehicle accident at age 65.   REVIEW OF SYSTEMS:  All systems reviewed, negative to the above problem  except as noted above.   PHYSICAL EXAMINATION:  GENERAL:  The patient currently is in no  distress, still has some left low flank pain.  VITAL SIGNS:  Blood pressure 151/76, pulse in the 50s, temperature is  97.9, respiratory rate 19, and O2 sat on  3 liters is 94%.  HEENT:  Normocephalic and atraumatic.  EOMI.  PERRL.  Mucous membranes  are moist.  NECK:  Status post left CEA.  No bruits.  JVP is normal.  No  thyromegaly.  LUNGS:  Decreased air flow.  No rales or wheezes.  CARDIAC:  Regular rate and rhythm.  S1 and S2.  Grade 1-2/6 systolic  murmur, heard best at the base.  No S3.  ABDOMEN:  Supple and nontender.  Normal bowel sounds.  No hepatomegaly.  EXTREMITIES:  2+ posterior tibial.  No lower extremity edema.  Feet  warm.  NEURO:  Alert and oriented x3.  Cranial nerves II-XII are grossly  intact.  Moving all extremities.   Abdominal CT shows focal dissection in between the renal arteries and  the inferior mesenteric artery and also a small dissection involving the  left common iliac.  12-lead EKG not done.   LABORATORY DATA:  Hemoglobin of 12.9.  BUN and creatinine of 11 and 1.3.  Potassium of 3.2.   IMPRESSION:  1. 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   We are asked      to see regarding blood pressure control.  I agree with continuing      his current regimen.  I agree also with increasing his Toprol to      100.  We will need to watch his heart rate.  There is a little      confusion he may have been on this at home based on his medicines      per pharmacy.  We would add hydralazine 25 t.i.d. to his regimen.  2. Coronary artery disease.  No signs of acute ischemia.  3. Dyslipidemia.  We will resume Niaspan and pravastatin.  4. Atrial fibrillation, on amiodarone.  We would hold Coumadin.      Surgery will need to review.  Aspirin is already on hold, again      await input.   I agree with post-CT hydration for renal function and reassess in a.m.      Pricilla Riffle, MD, [facility]  Electronically Signed     PVR/MEDQ  D:  10/02/2008  T:  10/03/2008  Job:  [id number]

## 2011-04-25 NOTE — Consult Note (Signed)
[name] [name], [name] [name]             ACCOUNT NO.:  [id number]   MEDICAL RECORD NO.:  [id number]          PATIENT TYPE:  INP   LOCATION:  5506                         FACILITY:  [facility]   PHYSICIAN:  Janetta Hora. Fields, MD  DATE OF BIRTH:  1941-03-03   DATE OF CONSULTATION:  10/02/2008  DATE OF DISCHARGE:                                 CONSULTATION   REQUESTING PHYSICIAN:  [facility].   REASON FOR CONSULTATION:  Abdominal aortic aneurysm.   HISTORY OF PRESENT ILLNESS:  The patient is a 70 year old male with  history of known abdominal aortic aneurysm.  He has had a 5-day history  of left flank pain.  He states that the pain is constant, not improved  with walking or position.  He states it is a constant ache.  His  atherosclerotic risk factors include tobacco abuse, although he quit 2  years ago.  He also has a history of COPD, coronary artery disease, and  hypertension.   PAST MEDICAL AND SURGICAL HISTORY:  Remarkable for atrial fibrillation,  left carotid endarterectomy by Dr. Madilyn Fireman in 2005, renal stones,  hyperlipidemia, and obesity.   REVIEW OF SYSTEMS:  He denies chest pain.  He has dyspnea with minimal  exertion.  He has no recent weight gain or loss.  He has not been  confused recently.  He has no history of GI bleeding.  No history of  stroke or TIA.  No history of DVT.   SOCIAL HISTORY:  Tobacco history as mentioned above.  He denies use of  alcohol.   MEDICATIONS:  Amiodarone, aspirin, digoxin, doxazosin, Lasix, Lipitor,  lisinopril, multivitamin, Niaspan, Norvasc, potassium chloride, Toprol-  XL, and Coumadin.   ALLERGIES:  He has no known drug allergies.   PHYSICAL EXAMINATION:  VITAL SIGNS:  Blood pressure is 141/80, heart  rate is 104 and slightly irregular, respirations 24, and temperature  98.1.  HEENT:  Unremarkable.  NECK:  He has no bruit.  CHEST:  Clear to auscultation.  CARDIAC:  Irregular with no murmur.  ABDOMEN:  Obese, soft, nontender,  and nondistended.  He has no pulsatile  mass.  EXTREMITIES:  He has 2+ femoral pulses.  NEURO:  He is alert and oriented x3. He has upper extremity and lower  extremity motor strength, which is 5/5 and symmetric.   LABORATORY VALUES:  Hemoglobin is 12.1, creatinine 1.4, and potassium  3.1.   Noncontrast renal CT performed for stones shows no evidence of renal  stones.  He has a 2-cm left common iliac artery aneurysm and has a 4.2-  cm infrarenal abdominal aortic aneurysm.  There is no retroperitoneal  stranding.  There is no retroperitoneal asymmetry.  There is no  retroperitoneal edema.   ASSESSMENT:  1. Left flank pain with small abdominal aortic aneurysm.  Risk of      rupture of an aneurysm of the size is extremely low, much less than      1%.  2. Small left iliac aneurysm, also a little risk of rupture.   Other possibilities and differential diagnosis include other renal  problems such as infarction,  also a differential includes  musculoskeletal pain.   RECOMMENDATIONS:  Check a CT angio of the abdomen and pelvis to rule out  any extravasation of contrast, further evaluate the retroperitoneum, and  thus we will also evaluate his renal vasculature as well as the renal  parenchyma.  If the CT scan is negative for any of the renal vascular  findings or evidence of ruptured aneurysm, then the patient can follow  up with an ultrasound of the aorta and iliac arteries with Dr. Madilyn Fireman,  his vascular surgeon in 6 months or Dr. [name], his cardiologist in 6  months who has been following his aneurysm for him over the last year or  so.   Hypokalemia, we will leave treatment plan at the discretion of the  emergency room physicians.      Janetta Hora. Fields, MD  Electronically Signed     CEF/MEDQ  D:  10/02/2008  T:  10/02/2008  Job:  [id number]

## 2011-04-25 NOTE — Assessment & Plan Note (Signed)
[facility]                            CARDIOLOGY OFFICE NOTE   NAME:[name] [name], [name] [name]                    MRN:          [id number]  DATE:10/14/2007                            DOB:          February 15, 1941    The patient is a very pleasant gentleman who has a history of coronary  artery disease, ischemic cardiomyopathy, and COPD.  His most recent  catheterization was performed in October of 2007 and he had patent  grafts and an ejection fraction of 45%.  He also has COPD.  Since I last  saw him, he continues to have dyspnea on exertion which has been a  chronic issues but unchanged.  There is no orthopnea, PND, pedal edema,  palpitations, presyncope, syncope, or exertional chest pain.  He has  continued off of cigarettes.   MEDICATIONS:  1. Niaspan 1500 mg p.o. q.h.s.  2. Potassium 20 mEq p.o. t.i.d.  3. Amlodipine 10 mg p.o. daily.  4. Pacerone 200 mg p.o. daily.  5. Toprol 100 mg p.o. daily.  6. Lasix 40 mg p.o. b.i.d.  7. Digoxin has been discontinued.  8. Lisinopril 40 mg p.o. daily.  9. Doxazosin 8 mg p.o. daily.  10.Coumadin as directed.  11.Aspirin 81 mg p.o. daily.  12.Multivitamin.  13.Amiodarone 200 mg p.o. daily.  14.Spiriva.  15.Xopenex.  16.Pravachol 80 mg p.o. daily.   PHYSICAL EXAMINATION:  VITAL SIGNS:  Blood pressure 114/70, pulse 48.  HEENT:  Normal.  NECK:  Supple.  CHEST:  Clear.  CARDIOVASCULAR:  Bradycardic rate but a regular rhythm.  Heart sounds  are distant.  ABDOMEN:  No tenderness.  EXTREMITIES:  No edema.   Electrocardiogram shows a sinus bradycardia at a rate of 48.  There is a  left bundle branch block.  He also had an abdominal ultrasound today  with preliminary showing a 2 cm x 1.8 cm left common iliac artery focal  aneurysm.  The abdominal aorta was not dilated.  Note, he had carotid  Dopplers in July that showed patent left carotid endarterectomy and a 0-  39% bilateral.   DIAGNOSIS:  1. Coronary artery  disease status post coronary artery bypass graft.      He has had or increased shortness of breath.  We will continue with      medical therapy including his Toprol, aspirin, statin, ACE      inhibitor, and beta blocker.  His recent catheterization in October      of 2007 showed patent grafts.  2. Ischemic cardiomyopathy.  He will continue on his beta blocker and      ACE inhibitor.  His digoxin has been discontinued.  3. Paroxysmal atrial fibrillation.  He will continue on his amiodarone      as well as his Coumadin with a goal INR of 2-3.  We will check TSH,      liver functions, and chest x-ray today given his amiodarone use.  4. Coumadin therapy.  This is being monitored in the [facility]      and a goal INR should be between 2-3.  We will check a CBC  today as      well.  5. History of mitral regurgitation and cardiomyopathy.  We will repeat      his echocardiogram.  6. Aneurysmal dilatation of the iliac.  He will need follow-up      Dopplers in one year.  7. Hypertension.  His blood pressure is well controlled.  8. Bradycardia.  His heart rate is somewhat slow today and I have      asked him to decrease his Toprol to 50 mg p.o. daily.  9. Hyperlipidemia.  We will check lipids and liver and adjust as      indicated.  We will refer him to our lipid clinic.  10.Tobacco abuse, now resolved.   I did discuss the importance of diet and exercise.  He will see [place] back  in six months.     Madolyn Frieze [name], MD, [facility]  Electronically Signed    BSC/MedQ  DD: 10/14/2007  DT: 10/14/2007  Job #: [id number]

## 2011-04-28 NOTE — Op Note (Signed)
NAME:  [name] [name], [name] [name]                       ACCOUNT NO.:  [id number]   MEDICAL RECORD NO.:  [id number]                   PATIENT TYPE:  INP   LOCATION:  3311                                 FACILITY:  [facility]   PHYSICIAN:  Balinda Quails, M.D.                 DATE OF BIRTH:  [birth year]   DATE OF PROCEDURE:  06/28/2004  DATE OF DISCHARGE:                                 OPERATIVE REPORT   PREOPERATIVE DIAGNOSIS:  Severe left internal carotid artery stenosis.   POSTOPERATIVE DIAGNOSIS:  Severe left internal carotid artery stenosis.   OPERATION PERFORMED:  Left carotid endarterectomy.   SURGEON:  Balinda Quails, M.D.   ASSISTANT:  Eber Jones A. Eustaquio Boyden.   ANESTHESIA:  General endotracheal.   ANESTHESIOLOGIST:  Zenon Mayo, MD   INDICATIONS FOR PROCEDURE:  [name] is a 70 year old male referred for  evaluation of a left carotid bruit and Doppler findings of a severe left  internal carotid artery stenosis.  He underwent cerebral arteriography  verifying a high grade left internal carotid artery stenosis at the carotid  bifurcation.  He was brought to the operating room for left carotid  endarterectomy.  The risks of this operative procedure with major morbidity  and mortality of 1 to 2% to include but not limited to MI, CVA, cranial  nerve injury and death have been discussed with the patient.   DESCRIPTION OF PROCEDURE:  The patient was brought to the operating room in  stable condition.  Placed in supine position.  General endotracheal  anesthesia induced.  The left neck prepped and draped in the sterile  fashion.  A curvilinear skin incision was made along the anterior border of  the left sternomastoid muscle.  Dissection carried down through subcutaneous  tissue with electrocautery.  Platysma incised.  Deep dissection carried  along the anterior border of the sternomastoid to the carotid sheath.  The  common carotid artery mobilized proximally down to the  omohyoid muscle and  encircled with a vessel loop.  Carotid bifurcation exposed.  The facial vein  ligated with 3-0 silk and divided.  The internal carotid artery was followed  distally up to the posterior belly of the digastric muscle.  The hypoglossal  nerve and vagus nerve were both identified and preserved.  The distal  internal carotid artery encircled with a vessel loop.  The origin of the  superior thyroid and external carotid were freed and encircled with vessel  loops.  The patient administered 7000 units of heparin intravenously.  Adequate circulation time permitted.   Carotid vessels controlled with clamps.  Longitudinal arteriotomy made in  the distal common carotid artery, the arteriotomy extended across the  carotid bulb and up into the internal carotid artery.  There was extensive  plaque at the left internal carotid artery origin with ulceration and high  grade stenosis.  The shunt was then inserted.  An endarterectomy elevator  was used to remove the plaque.  The endarterectomy carried down into the  common carotid artery where the plaque was divided transversely with Potts  scissors.  The plaque raised up into the bulb and the superior thyroid and  external carotid artery were endarterectomized using an eversion technique.  The internal carotid artery plaque then feathered out distally.  The  fragments of plaque were removed with plaque forceps.  Site irrigated with  heparin saline solution and Decadron solution.  A primary arteriotomy  closure was carried out due to the large size of the internal carotid  artery.  Running 6-0 Prolene suture was used to close the arteriotomy.  The  shunt then removed and all vessels well flushed.  Clamps were removed  directing the initial antegrade flow up the external carotid artery,  following this the internal carotid was released.  Excellent pulse and  Doppler signal were present in the internal carotid artery. Patient  administered  50 mg of protamine intravenously.   The sponge and instrument counts were correct.  Excellent hemostasis  obtained.  Sternomastoid fascia closed with running 2-0 Vicryl suture.  Platysma closed with running 3-0 Vicryl suture.  Skin closed with 4-0  Monocryl.  Steri-Strips applied.  Sterile dressing applied.  Anesthesia was  reversed.  Patient awakened readily, moved all extremities to command.  There were no apparent complications.  Transferred to the recovery room in  stable condition.                                               Balinda Quails, M.D.    PGH/MEDQ  D:  06/28/2004  T:  06/29/2004  Job:  [id number]   cc:   Salvadore Farber, M.D. [facility]  [address]  [address]  [place]  [place] [address]

## 2011-04-28 NOTE — Consult Note (Signed)
[place]  Patient:    [name] [name], [name] [name] Visit Number: [id number] MRN: [id number]          Service Type: EMS Location: [facility] Attending Physician:  Armanda Heritage Dictated by:   Rozanna Boer., M.D. Admit Date:  04/16/2002 Discharge Date: 04/16/2002                            Consultation Report  REFERRING PHYSICIAN:  Earline Mayotte, M.D., emergency room.  REASON FOR CONSULTATION:  A 5 mm right upper ureteral stone with obstruction.  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  He tried to sleep but could not get comfortable.  The pain has persisted until now.  He came in to the emergency room where a CT scan showed a 5 mm proximal ureteral stone with moderate hydronephrosis.  After some morphine, he was fairly comfortable.  He had had one previous stone about 14 years ago in [place] and that was removed by basket extraction.  He has had no stone since then.  He had a heart bypass x 5 in 1998.  He takes hydrochlorothiazide, Diltiazem, potassium, Lisinopril, doxazosin, Lipitor, Toprol, and Ecotrin.  SOCIAL HISTORY:  He is a smoker.  ALLERGIES:  No known drug allergies.  PAST SURGICAL HISTORY:  A left elbow operation about 30 years ago plus above.  PHYSICAL EXAMINATION:  VITAL SIGNS:  Vital signs were stable.  GENERAL:  The patient is a middle-aged, somewhat overweight white male in no acute distress.  LUNGS:  Clear.  ABDOMEN:  Soft and benign with no CVA tenderness.  No abdominal masses but he is obese.  GENITALIA:  Normal.  Penis without lesions.  Prostate is moderately enlarged and benign.  LABORATORY DATA:  CT was reviewed and the results concurred with.  Since he is on aspirin, I cannot do lithotripsy tomorrow as this may clot, but we will have him to come to the office at 9 oclock tomorrow to do a KUB.  We will decide  whether he needs a stent or not until the aspirin wears off before he can have lithotripsy.  IMPRESSION: 1. Proximal right ureteral stone with obstruction. 2. Previous coronary artery bypass graft. 3. Hematuria. Dictated by:   Rozanna Boer., M.D. Attending Physician:  Armanda Heritage DD:  04/16/02 TD:  04/18/02 Job: [id number]

## 2011-04-28 NOTE — H&P (Signed)
[name] [name], [name] [name]             ACCOUNT NO.:  [id number]   MEDICAL RECORD NO.:  [id number]          PATIENT TYPE:  INP   LOCATION:  1824                         FACILITY:  [facility]   PHYSICIAN:  Verne Grain, MD   DATE OF BIRTH:  07-Nov-1941   DATE OF ADMISSION:  07/30/2005  DATE OF DISCHARGE:                                HISTORY & PHYSICAL   CARDIOLOGIST:  Dr. Olga Millers.   PRIMARY CARE PHYSICIAN:  None.   CHIEF COMPLAINT:  Dyspnea/orthopnea, consistent with congestive heart  failure, as well as notation of new onset atrial fibrillation with rapid  ventricular rate and new left bundle branch block (new when compared to EKG  July, 2005).   HPI:  A 70 year old male obese, hypertension, hyperlipidemia, heavy tobacco  (1-1/2 packs per day ongoing), status post left carotid endarterectomy  (July, 2005), coronary artery disease status post CABG in 1998, followed in  cardiology in clinic by Dr. [name].  Presented to [facility] Emergency  Room with complaints of significantly increased dyspnea on exertion and  orthopnea, making it impossible for him to sleep.  Findings on exam and  under diagnostic evaluation most notable for volume overload.  The EKG  reveals atrial fibrillation with rapid ventricular rate which was reportedly  new for the patient.  He is on Diltiazem and Toprol which is suspicious for  history of paroxysmal atrial fibrillation with rapid ventricular rate,  although neither the patient himself nor his wife recall the terms irregular  heartbeat, atrial fibrillation or Coumadin.  Therefore, we will presume that  this atrial fibrillation with rapid ventricular rate is possibly new.  Likewise, the patient's EKG reveals a left bundle branch block pattern which  is also new since last EKG obtained in July of 2005.  The patient reports  shortness of breath, orthopnea and gross dyspnea on exertion since Thursday  night (July 27, 2005).  The patient reports that the  dyspnea continued to  worsen to the point that he could not sleep last night.  He denies any known  dietary indiscretion, denies any medical noncompliance.  The patient reports  no chest pain.  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  The patient otherwise is  without complaint.   ALLERGIES ADVERSE REACTIONS:  NO KNOWN DRUG ALLERGIES.   CURRENT MEDICATIONS:  1.  Hydrochlorothiazide 25 mg p.o. daily.  2.  Toprol XL 50 mg p.o. daily.  3.  K-Dur 30 mEq p.o. daily.  4.  Doxazosin 8 mg p.o. daily.  5.  Niaspan 1000 mg p.o. daily.  6.  Lisinopril 40 mg p.o. daily.  7.  Lipitor 40 mg p.o. daily.  8.  Diltiazem 180 mg p.o. daily.  9.  Aspirin 325 mg p.o. daily.  10. Multivitamin one tablet p.o. daily.   PAST MEDICAL HISTORY:  1.  Coronary artery disease status post CABG in 1998 by Dr. Donata Clay.  2.  History of left carotid artery stenosis status post left  carotid      endarterectomy (July, 2005) by Dr. Madilyn Fireman.  3.  Hypertension.  4.  Hyperlipidemia.  5.  Tobacco (1-1/2 packs per day).  6.  Obesity.  7.  History of right nephrolithiasis status post ureteroscopy and stent      performed May, 2003.   SOCIAL HISTORY:  [place].  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  He has 3 of his  own children and 4 stepchildren.  He smokes 1-1/2 packs of cigarettes per  day.  He has a rare beer once every 2-4 weeks.  He reports no illicit drug  use.  No heroin, crack or cocaine.   FAMILY HISTORY:  Mother and father both died in a car accident when the  patient was 70 years old.  He has no siblings.   REVIEW OF SYSTEMS:  Negative for fevers, chills, sweats, weight change or  adenopathy.  No recent headaches or acute alteration in auditory or visual  acuity.  Patient does complain of shortness of  breath, dyspnea on exertion,  orthopnea, PND and lower extremity edema which are all notably marked or  abnormal for him.  Patient has no bowel or bladder complaints, no acute  neuropsychiatric complaints.  There are no polyuria, no polydipsia.  No  bowel or bladder complaints.  All other systems are negative.   PHYSICAL EXAMINATION:  Temperature 97.8, heart rate 105, respiratory rate  22, blood pressure 145/95.  In general, patient is pleasant, cooperative,  alert, answers questions appropriately.  He is mildly tachypnic with mild  respiratory discomfort, sitting upright in bed.  He is normocephalic,  atraumatic, extraocular eye movements are intact.  Oropharynx is pink and  moist without lesions.  NECK EXAMINATION:  Supple with jugular venous pressures estimated at 10-11  cmH2O.  There is no palpable lymphadenopathy, S1 and S2 are irregularly  irregular and tachycardic.  LUNG FIELDS:  Are clear to auscultation bilaterally.  BRIEF SKIN EXAMINATION:  Reveals well healed scars from lower extremity and  chest wall but no acute lesions are noted.  ABDOMEN:  Obese, soft, nontender, nondistended with positive bowel sounds.  LOWER EXTREMITY EXAMINATION:  Reveals 1-2+ lower extremity edema.  NEUROLOGIC EXAM:  Is grossly nonfocal.  The patient moves all four  extremities without difficulty.  He is alert and answers questions  appropriately.   Chest x-ray:  Cardiomegaly with mid bibasilar edema or infiltrate.   EKG:  Rate of 100, rhythm of atrial fibrillation.  Intervals:  PR, QRS and  QTC are abnormal.  His PR is nondetectible because of atrial fibrillation  rhythm.  QRS is markedly prolonged from 100 milliseconds to well over 130  milliseconds, now with left bundle branch block (comparative EKG July,  2005).   LABORATORY VALUES:  White blood cell count 9.1, hematocrit 45, platelet count 228, sodium 143, potassium 3.4, chloride 111, bicarb 23, BUN 14,  creatinine 1.0, glucose 108, anion gap  7, calcium 8.4, total bilirubin 1.3,  AST 17, ALT 32, CK MB less than 1.0.  PTT 32, INR 4.4, total bilirubin 1.3,  AST 17, ALT 32, alkaline phosphatase is 75 and troponin is less than 0.05.  CK MB is less than 1.0.  Total protein is 5.8, albumin is 3.14.  BNP is 243,  myoglobin is 99.   IMPRESSION AND PLAN:  A 70 year old male, obese, hypertension,  hyperlipidemia, ongoing heavy tobacco use, status post left carotid  endarterectomy  in July of 2005, CAD status post remote CABG in 1998,  presenting with congestive heart failure/volume overload and possibly new  left bundle branch block and new atrial fibrillation with rapid ventricular  rate.  Potassium equals 3.4.  1.  Congestive heart failure/volume overload.  Will diurese with Lasix 40 mg      IV daily with strict ins and outs, daily weights and low sodium diet      with a goal to diurese negative 1-2 liters per day.  Will check an      echocardiogram to assess ejection fraction and wall motion.  2.  New left bundle branch block and new atrial fibrillation with rapid      ventricular rate.  Will anticoagulate the patient with heparin while      differing initiation of Coumadin until it is clear the patient will not      require invasive procedures.  Intravenous diltiazem will be utilized for      rate control.  Will plan on making the patient n.p.o. after midnight      tonight for direct current cardioversion tomorrow.  Will complete the      rule out of myocardial infarction, serial cardiac markers and EKGs on      telemetry.  3.  Coronary artery disease.  When above evaluation and treatment is      complete, if ejection fraction is normal would complete evaluation with      a nuclear perfusion imaging study to ensure that there are no active      areas of ischemia.  If the patient's ejection fraction is markedly      abnormal, would pursue other strategies such as cardiac catheterization      to exclude the presence of any significant  ischemia.  While the patient      has no clear symptoms of ischemia, he also is unaware of his atrial      fibrillation with rapid ventricular rate and, therefore, may be one of      the people who is at risk for ischemia that is less symptomatic.  Will      continue aspirin, Toprol, Lipitor, lisinopril as previously described      and continue to encourage tobacco cessation.  4.  Hyperlipidemia.  Continue Lipitor.  As the patient's LFTs are stable,      will check a lipid profile in the morning to assure that lipids are at      goal.  5.  Hypertension.  Continue Toprol, diltiazem, hydrochlorothiazide,      doxazosin and lisinopril for optimal control of blood pressure (less      than 135/85).  6.  Potassium 3.4.  Supplement with 40 mEq     p.o. x1 and recheck with      resupplementation as needed.  7.  Tobacco cessation/chronic obstructive pulmonary disease.  Have sent a     tobacco cessation consult and ordered Combivent inhaler q.6 hours p.r.n.  8.  Obesity.  Will recommend diet, exercise, perhaps even in a setting of      cardiac rehabilitation following this hospitalization, as the patient is      significantly overweight.  Consideration could also be given to      evaluation for obstructive sleep apnea, again as an outpatient, pending      further evaluation.           ______________________________  Verne Grain, MD     DDH/MEDQ  D:  07/30/2005  T:  07/30/2005  Job:  [id number]

## 2011-04-28 NOTE — Op Note (Signed)
NAME:  [name] [name], [name] [name]                       ACCOUNT NO.:  [id number]   MEDICAL RECORD NO.:  [id number]                   PATIENT TYPE:  OIB   LOCATION:  2899                                 FACILITY:  [facility]   PHYSICIAN:  Salvadore Farber, M.D. [facility]         DATE OF BIRTH:  1940/12/24   DATE OF PROCEDURE:  06/22/2004  DATE OF DISCHARGE:                                 OPERATIVE REPORT   ADDENDUM   Intracerebral portion of the exam will be interpreted separately by  neuroradiology.                                               Salvadore Farber, M.D. [facility]    WED/MEDQ  D:  06/22/2004  T:  06/22/2004  Job:  [id number]

## 2011-04-28 NOTE — Op Note (Signed)
NAME:  [name] [name], [name] [name]                       ACCOUNT NO.:  [id number]   MEDICAL RECORD NO.:  [id number]                   PATIENT TYPE:  OIB   LOCATION:  NA                                   FACILITY:  [facility]   PHYSICIAN:  Salvadore Farber, M.D. [facility]         DATE OF BIRTH:  05/31/1941   DATE OF PROCEDURE:  06/22/2004  DATE OF DISCHARGE:                                 OPERATIVE REPORT   PROCEDURE PERFORMED:  Bilateral carotid angiography with intracerebral  angiography.   PROCTOR:  Balinda Quails, M.D.   INDICATIONS FOR PROCEDURE:  Mr. Oelke is a 70 year old gentleman who was  found to have left carotid bruit.  Carotid Doppler revealed severe left  internal carotid stenosis in the mid internal carotid artery.  There was no  significant disease on the right.  He was referred for diagnostic  angiography to assess the distal extent of the lesion.   DESCRIPTION OF PROCEDURE:  Informed consent was obtained.  Under 1%  lidocaine local anesthesia, a 5 French sheath was placed in the right common  femoral artery using the modified Seldinger technique.  Over a Wholey wire,  the pigtail catheter was advanced into the ascending aorta.  Arch  aortography was performed by power injection.  This demonstrated a type 2  arch with normal origins of the great vessels.  A 5 French Vitek catheter  was advanced and used to selectively engage the left common carotid artery.  Angiography was performed by hand injection in multiple views.  The catheter  was then manipulated into the innominate.  Angiography was performed by hand  injection.  A Wholey wire was then advanced into the proximal portion of the  right common carotid artery.  Catheter was advanced over this wire.  Angiography performed by hand injection in multiple views.  Catheter was  then removed over a wire.  The patient tolerated the procedure well and was  transferred to the holding room in stable condition.  Sheaths will be  removed  there.   COMPLICATIONS:  None.   FINDINGS:  1. Aorta:  Type 2 arch with minimal plaquing.  There are normal origins to     the great vessels.  2. Left carotid:  There is a 40% stenosis of the ostium of the common     carotid.  The remainder of the common is normal. There was then an 80%     stenosis of the midportion of the internal carotid artery just above the     bulb.  The proximal portion of the internal carotid is relatively normal.     Plaque extends approximately 1 cm above the bulb.  The external carotid     artery is normal.  3. Innominate artery:  Approximately 50% proximal stenosis.  4. Right carotid artery:  Normal common and external carotids.  There is     mild plaquing within the bulb of the internal carotid artery without  significant stenosis.   IMPRESSION/PLAN:  Dr. Madilyn Fireman plans left carotid endarterectomy.                                               Salvadore Farber, M.D. [facility]    WED/MEDQ  D:  06/22/2004  T:  06/22/2004  Job:  [id number]   cc:   Balinda Quails, M.D.  [address]  [place]  [place] [address]   Olga Millers, M.D. [facility]

## 2011-04-28 NOTE — Cardiovascular Report (Signed)
NAME:  [name] [name], [name] [name]             ACCOUNT NO.:  [id number]   MEDICAL RECORD NO.:  [id number]            PATIENT TYPE:   LOCATION:                                 FACILITY:   PHYSICIAN:  Pricilla Riffle, M.D.         DATE OF BIRTH:   DATE OF PROCEDURE:  12/25/2005  DATE OF DISCHARGE:                              CARDIAC CATHETERIZATION   CARDIOVERSION   IDENTIFICATION:  [name] is a 70 year old with history of coronary  artery disease, atrial fibrillation on amiodarone therapy still in atrial  fibrillation planned for cardioversion.   Patient sedated for anesthesia using 120 mg Pentothal IV with pads in AP  position.  Patient cardioverted to sinus rhythm with 200 joules synchronized  biphasic energy.  A 12-lead EKG x1 pending.  Procedure without  complications.           ______________________________  Pricilla Riffle, M.D.     PVR/MEDQ  D:  12/25/2005  T:  12/25/2005  Job:  [id number]

## 2011-04-28 NOTE — Discharge Summary (Signed)
NAMEJERON, [name]             ACCOUNT NO.:  [id number]   MEDICAL RECORD NO.:  [id number]          PATIENT TYPE:  INP   LOCATION:  3733                         FACILITY:  [facility]   PHYSICIAN:  Olga Millers, M.D. LHCDATE OF BIRTH:  1941/09/26   DATE OF ADMISSION:  12/20/2005  DATE OF DISCHARGE:  12/26/2005                           DISCHARGE SUMMARY - REFERRING   DISCHARGE DIAGNOSES:  1.  Recurrent atrial fibrillation, status post DC cardioversion, restoring      normal sinus rhythm.  2.  Amiodarone loading.  3.  Mild congestive heart failure.  4.  Known ischemic cardiomyopathy.  5.  Hypokalemia.  6.  History as noted below.   OPERATION/PROCEDURE:  Status post cardioversion, restoring normal sinus  rhythm on December 25, 2005.   DISPOSITION:  [name] [name] was discharged home.   DIET:  He is asked to maintain low salt, low fat, cholesterol diet.   ACTIVITY:  Not restricted.   DISCHARGE MEDICATIONS:  1.  Amiodarone 400 mg daily for 30 days, then 200 mg daily.  2.  Potassium was increased to 40 mEq daily.  3.  He was asked to begin Coumadin 5 mg alternating with 7.5 mg daily.  4.  He was asked to discontinue his digoxin.  5.  Resume Lasix 60 mg p.o. daily.  6.  Lisinopril 40 mg daily.  7.  Niaspan 1 g at night.  8.  Doxazosin 8 mg daily.  9.  Lipitor 40 mg at night.  10. Aspirin 81 mg daily.  11. Toprol-XL 100 mg b.i.d.  12. Multivitamin daily.   FOLLOW UP:  He will have a PT/INR and BMET on December 29, 2005 at Dr.  Ludwig Clarks office at 11 a.m.  He will follow up with Dr. [name] on December 29, 2005 at 2:30 p.m.  Dr. [name] notes at the time of discharge he will  need a repeat echocardiogram in approximately 12 weeks to reassess his left  ventricular function and his mitral regurgitation. If his EF remains less  than 30%, consideration of repair and evaluation for ICD/bivalve will be  considered.  He will also need followup LFTs, TSH, chest x-ray since  amiodarone  was initiated in approximately 12 weeks.   SUMMARY OF HISTORY:  [name] [name] is a 70 year old male who saw Dr. [name]  in the office on December 19, 2005 after a recent admission for CHF and atrial  fibrillation.  During that admission, by TEE he was noted to have a left  atrial thrombus.  Thus he was anticoagulated.  Since that admission, he has  continued to have symptomatic complaints associated with atrial  fibrillation.  Due to recurring atrial fibrillation following cardioversion  and increasing symptoms, admission to [facility] was planned for  amiodarone loading.   His medical history is notable for bypass surgery in 1998 by Dr. Donata Clay.  A TEE on October 23, 2005 showed an EF of 15-25%, severe diffuse  hypokinesis, trivial AI, severe mitral regurgitation, left atrial dilatation  with a nonmobile thrombus and a left atrial appendage, mild tricuspid  regurgitation.  Stress test in August 2006 showed  a EF of 30%.  No scar or  ischemia.  History of atrial fibrillation.  Prior left carotid  endarterectomy, hypertension, hyperlipidemia, remote tobacco use,  nephrolithiasis and CHF.   LABORATORY DATA:  Chest x-ray on January 10 showed cardiomegaly, mild COPD.  No active disease.  On admission hemoglobin and hematocrit were 14.2 and  41.8.  Normal indices.  Platelets 196.  WBCs 9.7.  subsequent hematology on  January 15 showed hemoglobin and hematocrit 13.3 and 40.3.  Normal indices.  Platelets 177.  WBC 9.4.  Admission PT/INR was 27.6 and 2.6 respectively.  Throughout his admission his INR remained therapeutic.  On December 26, 2005  INR was 3.7.  On admission, sodium was 141, potassium 3.1, BUN 9, creatinine  1.1, glucose 140.  Normal LFTs.  Subsequent potassium on January 12 was 3.6.  Prior to discharge sodium was 142, potassium 3.8, BUN 17, creatinine 1.3,  glucose 111.  TSH was 0.746. His EKG on admission showed atrial  fibrillation, left bundle branch block. Subsequent  EKG prior to discharge  showed sinus rhythm.   HOSPITAL COURSE:  [name] [name] was admitted to [facility]  for  amiodarone loading and management of recurrent atrial fibrillation.  His  digoxin was discontinued.  With amiodarone loading, Dr. [name] felt that  he should be able to undergo TEE cardioversion.  Nursing did notice  __________  wide complex tachycardia that was asymptomatic.  Over the  weekend, he continued to do well with amiodarone loading.  Amiodarone was  decreased to 400 mg of b.i.d.  On December 25, 2005, Dr. Tenny Craw performed  cardioversion with 200 joules, restoring normal sinus rhythm. On December 26, 2005, he was maintaining sinus rhythm. INR was 3.7.  Dr. [name] felt that  he could be discharged to home.   Discharge time greater than 30 minutes.      Joellyn Rued, P.A. [facility]    ______________________________  Olga Millers, M.D. [facility]    EW/MEDQ  D:  12/26/2005  T:  12/26/2005  Job:  [id number]   cc:   Kerin Perna, M.D.  [address]  [place]  [place] [address]

## 2011-04-28 NOTE — Op Note (Signed)
[facility]  Patient:    [name] [name], [name] [name] Visit Number: [id number] MRN: [id number]          Service Type: MED Location: 3W [id number] [id number] Attending Physician:  Katherine Roan Dictated by:   Rozanna Boer., M.D. Proc. Date: 04/19/02 Admit Date:  04/17/2002 Discharge Date: 04/20/2002                             Operative Report  PREOPERATIVE DIAGNOSIS:  Urinary extravasation, right UPJ.  POSTOPERATIVE DIAGNOSIS:  Urinary extravasation, right UPJ.  OPERATION PERFORMED:  Cysto retrograde and removal and replaced double J ureteral stent.  ANESTHESIA:  General.  SURGEON:  Rozanna Boer., M.D.  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 On Thursday, he had look like on a KUB the stone had moved down to the distal area; but when he went in to have ureteroscopy, retrograde showed that it was still in the mid ureter but over the bones which was why it could not be seen. Attempt at ureteroscopy was done. Because he had been on aspirin, he could not have lithotripsy for some time, but in do so, the stone was pushed back up into the kidney and there was some proximal urinary extravasation near the UPJ. There was also a small area of extravasation down distally but this was quite small. It took a long time, but I was able to finally get what I thought a double J stent up into the renal pelvis which was made difficult by the extravasation; but postoperatively it became clear that the stent, the proximal end, was outside the collecting system. He had a right percutaneous nephrostomy tube placed which has been draining overnight clear urine. The patient has done well and comes in now for removal of the malplaced ureteral stent and possibly reinsert another if possible. The stone now is back in the kidney.  DESCRIPTION OF PROCEDURE:  The patient  was placed on the operating table in the dorsal lithotomy position, prepped and draped with Betadine in the usual sterile fashion. A #22 panendoscope was passed. He did have a little circumferential stricture in the deep bulbous urethra that was easily negotiated by the scope. His prostate was moderately enlarged in a trilobar fashion. The bladder was entered and the stent was seen emerging from the right ureteral orifice, grasped and removed intact. Looking back with the cystoscope, there were some blood clots in the ureteral orifice that had to be picked out. Then a retrograde with a 6 open ended ureteral catheter demonstrated some urinary extravasation in the proximal ureter, but there was a thin column of dye that went back up into the kidney. I then passed a Glidewire through the open ended catheter under fluoroscopy up to and into the renal pelvis as evidenced by the percutaneous nephrostomy and then advanced the open ended ureteral catheter as well. It was a little tight so I then passed a 0.038 floppy-tip guidewire after removing the Glidewire and this also seemed to be in the renal pelvis. I then removed the open ended catheter and then over the guidewire passed a #6 French 28 cm length double J ureteral stent. It looked like it was in good position in the renal pelvis as the guidewire was removed. The bladder was drained, B&O suppository inserted, and the patient taken to  the recovery room in good condition.  The plan is to clamp his nephrostomy tube and if he does well overnight remove the nephrostomy tube and let him go home with the internal stent. Dictated by:   Rozanna Boer., M.D. Attending Physician:  Katherine Roan DD:  04/19/02 TD:  04/20/02 Job: [id number]

## 2011-04-28 NOTE — Op Note (Signed)
[facility]  Patient:    [name] [name], HOMMES Visit Number: [id number] MRN: [id number]          Service Type: EMS Location: MINO Attending Physician:  Armanda Heritage Dictated by:   Rozanna Boer., M.D. Proc. Date: 04/17/02 Admit Date:  04/16/2002 Discharge Date: 04/16/2002                             Operative Report  PREOPERATIVE DIAGNOSIS:  Right lower ureteral stone.  POSTOPERATIVE DIAGNOSES:  Right lower ureteral stone, urinary extravasation of the right ureter.  PROCEDURE:  Cystoscopy, retrograde pyelogram, ureteroscopy and stent placement.  ANESTHESIA:  General.  SURGEON:  Rozanna Boer., M.D.  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 He had about 24-36 hours of intermittent pain. CT in the emergency room showed a 7 mm mid ureteral stone that on KUB this morning seemed to move down in the distal area. Since he was on aspirin, we could not do lithotripsy, so enters now for attempt at ureteroscopy and stone removal. He had a previous stone extraction in 1988 without recurrent. His past history is significant that he had CABG in 1998 and does smoke a pack a day.  DESCRIPTION OF PROCEDURE:  The patient was placed on the operating table in dorsal lithotomy position, and after satisfactory induction of general anesthesia was prepped and draped with Betadine and given Ancef IV. A 21 panendoscope was inserted, he had a few small short urethral strictures in the deep bulbous urethra that was easily negotiated by the scope. The prostate was mildly obstructing in a trilobar fashion. The right orifice was seen and it seemed like a little bit of blood was coming from the right orifice and retrograde demonstrated what looked like a filling defect in the distal ureter about 6-7 cm from the UV junction. There was proximal hydronephrosis present. A guidewire  was then passed up without difficulty and using a 4 cm ureteral balloon dilator, the distal ureter was dilated. The balloon seemed to be abnormal and there seemed to be a waist present when I blew up the balloon to 12 atmospheres but then when I pulled it back a little bit it seemed to open up. It was kept maintained for five timed minutes, but the balloon would not fully deflate when I detached it from the syringe. Leaving the guidewire in place, I removed the balloon and then passed the short 6 ureteroscope to the urethra and into the ureter. I passed it beyond the point where I thought I saw the stone. When backing up, I saw a little fat on the lateral aspect probably about 4 cm from the UV junction where there appeared to be a small area of extravasation. Either this was where the stone was and was pushed out through the wall with the balloon or the balloon itself may have caused the extravasation. In any event, I did not see the stone up to the pelvic brim. I then passed a 6 [place] 26 cm length double J ureteral stent up over the guidewire but when I entered that region of what I thought was the renal pelvis but when I removed the guidewire it seemed to be way short. I tried to get the wire back to the end of the stent but was unable to do so. I then pulled the stent out  through the urethral meatus and then passed a guidewire through the stent and then removed the 6 [place] double J. We placed the cystoscope and then over the guidewire passed the open ended ureteral catheter, removed the guidewire and then performed a retrograde and saw some extravasation in the proximal ureter. I then tried to pass a Glidewire up into the renal pelvis but was unable to do so. I then left the Glidewire as a safety wire and then passed the long 6 ureteroscope up to the level near the ureteropelvic junction and did see some fat but was able to pass a guidewire through this up into what looked like the  renal pelvis. I then removed the long ureteroscope and then over this passed the open ended ureteral catheter and removed the guidewire. This did irrigate and seemed to be in the renal pelvis. I then passed another guidewire through the open ended catheter and the passed a 6 [place] multilength ureteral stent over the guidewire and it coiled up in what I thought was the area of the renal pelvis. I then removed the guidewire and the stent hopefully was in good position. The bladder was then drained, he was given a B&O suppository and was taken to the recovery room but will be admitted for postoperative observation. I will check a CT scan in the morning to make sure that he is well drained and that the stent is in good position and that there is no further extravasation. Dictated by:   Rozanna Boer., M.D. Attending Physician:  Armanda Heritage DD:  04/17/02 TD:  04/19/02 Job: [id number]

## 2011-04-28 NOTE — Cardiovascular Report (Signed)
NAMESIE, FORMISANO             ACCOUNT NO.:  [id number]   MEDICAL RECORD NO.:  [id number]          PATIENT TYPE:  OIB   LOCATION:  2852                         FACILITY:  [facility]   PHYSICIAN:  Charlton Haws, M.D.     DATE OF BIRTH:  06-16-1941   DATE OF PROCEDURE:  DATE OF DISCHARGE:                              CARDIAC CATHETERIZATION   DIAGNOSIS:  DC Cardioversion.   HISTORY OF PRESENT ILLNESS:  Mr. Furness is a pleasant patient of Dr.  [name] who has ___________cardiomyopathy.  He was in the hospital at the  end of August.  At that time he had congestive heart failure  with new onset  atrial fibrillation.  A TEE-guided cardioversion was planned but his TEE  showed left atrial appendage clot.  He was placed on Coumadin.  He had a  followup TEE recently which showed persistent left atrial appendage clot,  however, his LV function has deteriorated from the 35-45% EF range to 25%  now with severe MR.  Due to his worsening LV function and MR Dr. [name]  and I felt that it would in the patient's best interest to proceed with  cardioversion despite the left atrial appendage clot since he has been  therapeutically anticoagulated.  His INR at the time of the cardioversion  was 3.6.   DESCRIPTION OF PROCEDURE:  The patient was anesthetized with 200 mg of  sodium pentothal.   Three synchronized 200 joule biphasic shocks were delivered.  Each time the  patient went from atrial fibrillation to sinus rhythm.  He had an underlying  left bundle.  He maintained sinus rhythm after the third shock.   IMPRESSION:  Successful cardioversion with the patient in normal sinus  rhythm at a rate of 80 with left bundle branch block.  The patient to  followup in the [facility] and in our office on Friday.  I am a little  bit skeptical that he will maintain sinus rhythm without any antiarrhythmic  therapy.  In the future he may need amiodarone or Tikosyn.     ______________________________  Charlton Haws, M.D.     PN/MEDQ  D:  11/14/2005  T:  11/14/2005  Job:  [id number]   cc:   Olga Millers, M.D. [facility]  [address]  [address]  [place]  [place] [address]

## 2011-04-28 NOTE — Assessment & Plan Note (Signed)
[facility]                              CARDIOLOGY OFFICE NOTE   NAME:[name] [name], [name] [name]                    MRN:          [id number]  DATE:10/23/2006                            DOB:          [birth year]    [name] [name] returns for followup today.  He was recently admitted to [facility] with complaints of dyspnea.  At that time, it was felt that  his dyspnea was most likely multifactorial, including congestive heart  failure, obesity, and COPD.  He was gently diuresed without significant  improvement.  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  The patient subsequently underwent  cardiac catheterization which revealed patent grafts and an ejection  fraction of 45%.  Note, his left ventricular end-diastolic pressure was 13  with a wedge of 14.  It was therefore felt that his dyspnea was most likely  not related to CHF.  Also of note, his BNP was only 238.  The patient was  seen by pulmonary during that admission, and followup has been recommended.  He was placed on Spiriva and Xopenex.  Since he was discharged, he continues  to have occasional dyspnea.  This is not clearly exertional.  There is no  orthopnea, PND, pedal edema, palpitations, or chest pain.  Also of note, he  did have a CPX stress test on October 17, 2006.  The ultimate conclusion was  that his dyspnea was most likely related to heart failure and obesity.   CURRENT MEDICATIONS:  1. Lasix 40 mg p.o. b.i.d.  2. Digoxin 0.125 mg p.o. daily.  3. Lisinopril 40 mg p.o. daily.  4. Niaspan 1 g p.o. q.h.s.  5. Doxazosin 8 mg p.o. daily.  6. Coumadin as directed.  7. Potassium 20 mEq p.o. daily.  8. Lipitor 40 mg p.o. daily.  9. Aspirin 81 mg p.o. daily.  10.Toprol-XL 100 mg p.o. daily.  11.Multivitamin.  12.Amiodarone 200 mg p.o. daily.  13.Chantix.  14.Norvasc 10 mg p.o. daily.   PHYSICAL EXAMINATION:  VITAL SIGNS:   Physical exam today shows a blood  pressure of 153/78, pulse 52.  CHEST:  Diminished breath sounds throughout.  CARDIOVASCULAR:  Regular rate and rhythm.  His heart sounds are distant.  EXTREMITIES:  No edema.  ABDOMEN:  Benign.   His electrocardiogram shows a sinus rhythm at a rate of 52.  There is a left  bundle branch block.   DIAGNOSES:  1. Continuing dyspnea.  2. Ischemic cardiomyopathy.  3. Coronary artery disease status post coronary artery bypass graft.  4. Mildly reduced left ventricular function by recent catheterization with      patent grafts.  5. History of atrial fibrillation, status post cardioversion.  6. Coumadin therapy.  7. History of mitral regurgitation, improved by most recent      echocardiogram.  8. Hypertension.  9. Hyperlipidemia.  10.Tobacco abuse.   PLAN:  [name] [name] continues to have dyspnea.  He is euvolemic on exam, and  his recent heart catheterization revealed a normal pulmonary capillary wedge  pressure.  His CPX suggested heart failure and obesity.  I think his dyspnea  is mostly likely a combination of COPD, heart failure, and deconditioning.  We will make no changes in his medications today, and I will check a BMET to  follow his potassium and renal function as well as a BNP.  He is scheduled  to see the pulmonologist back on Friday.  I have also recommended that he  begin exercising.  He has discontinued his tobacco use.  I will see him back  in 6 months.     Madolyn Frieze [name], MD, [facility]  Electronically Signed    BSC/MedQ  DD: 10/23/2006  DT: 10/23/2006  Job #: [id number]

## 2011-04-28 NOTE — Discharge Summary (Signed)
[name] [name]             ACCOUNT NO.:  [id number]   MEDICAL RECORD NO.:  [id number]          PATIENT TYPE:  INP   LOCATION:  4730                         FACILITY:  [facility]   PHYSICIAN:  [name] [name], M.D. LHCDATE OF BIRTH:  11-29-1941   DATE OF ADMISSION:  07/30/2005  DATE OF DISCHARGE:  08/04/2005                                 DISCHARGE SUMMARY   PROCEDURES:  1.  2-D echocardiogram.  2.  Transesophageal echocardiogram.   DISCHARGE DIAGNOSES:  1.  Atrial fibrillation with rapid ventricular response, improved on beta-      blocker and digoxin.  2.  Left atrial appendage thrombus, anticoagulation begun this admission.  3.  Anticoagulation with Coumadin.  4.  Status post aortocoronary bypass surgery in 1998.  5.  Peripheral vascular disease, status post left carotid endarterectomy.  6.  Hypertension.  7.  Hyperlipidemia.  8.  Tobacco.  9.  Obesity.  10. History of nephrolithiasis.  11. History of uteroscopy with a stent.  12. Congestive heart failure, volume overload.  13. Hyperkalemia secondary to diuresis.   HOSPITAL COURSE:  [name] [name] is a 70 year old male with known coronary  artery disease. He has a long history of tobacco abuse as well. He came to  the emergency room for volume overload congestive heart failure. He was  found be in atrial fibrillation with rapid ventricular response. He was  admitted for further evaluation and treatment.   He was diuresed and because of this his potassium became low which was  supplemented. A 2-D echocardiogram was performed but it was felt that a TEE  was needed to evaluate him in consideration for cardioversion. There was a  slightly mobile moderate-sized thrombus in the body of the left atrial  appendage. No left atrial thrombus was identified. The left ventricular size  was at the upper limits of normal and the EF was 35-45% by 2-D  echocardiogram.   [name] [name] atrial fibrillation was controlled by the discontinuing  his  Diltiazem and adding Toprol XL to this medication regimen. This was  uptitrated to 75 mg b.i.d. His other blood pressure medications were  continued. He was loaded on Coumadin with a 12.04/20/11.5/12.5. He is being  discharged on 5 mg daily and is to follow up at [facility].   [name] [name] was evaluated by Dr. Jens [name] who felt that he could be  discharged. He is supposed to get a cardioversion in four to six weeks after  his INR is therapeutic. He also recommended outpatient Myoview for decreased  left ventricular function. This is possibly tachycardia mediated so his  heart rate needs to be under control before this is performed. His renal  function was tolerating the diuresis very well with a BUN of 12 and  creatinine 0.2 [name] [name] is considered stable for discharge on August 04, 2005 with outpatient arranged.   DISCHARGE INSTRUCTIONS:  His activity is to be increased slowly. He is to  follow up with the [facility] on Monday at noon. He is to follow up  with Dr. Jens [name] and with a Myoview.   DISCHARGE MEDICATIONS:  1.  Coumadin 5 milligrams daily.  2.  K-Dur 20 mEq 2 tablets daily.  3.  Lipitor 40 milligrams daily.  4.  Aspirin 81 milligrams daily.  5.  Vitamins as prior to admission.  6.  Niaspan 1000 milligrams q.h.s.  7.  Doxazosin 8 milligrams daily.  8.  Lisinopril 20 milligrams daily.  9.  Digoxin 0.12 daily.  10. Lasix 40 milligrams daily.  11. Toprol XL 25 milligrams 3 tablets b.i.d.  12. He is not to take diltiazem.      [name] [name], P.A. [facility]    ______________________________  [name] [name], M.D. [facility]    RB/MEDQ  D:  08/04/2005  T:  08/05/2005  Job:  [id number]

## 2011-04-28 NOTE — Consult Note (Signed)
NAMETREYCE, SPILLERS             ACCOUNT NO.:  [id number]   MEDICAL RECORD NO.:  [id number]          PATIENT TYPE:  INP   LOCATION:  2029                         FACILITY:  [facility]   PHYSICIAN:  Madolyn Frieze. [name], MD, FACCDATE OF BIRTH:  1941/05/08   DATE OF CONSULTATION:  DATE OF DISCHARGE:                                   CONSULTATION   The patient is 70 year old male with past medical history of coronary  disease status post prior bypass graft, ischemic cardiomyopathy, mitral  regurgitation, carotid endarterectomy, atrial fibrillation, hypertension,  hyperlipidemia, who presents with congestive heart failure.  The patient has  had a coronary bypassing graft in 1998.  In August 2006, he had new onset  atrial fibrillation associated with congestive heart failure.  At that time,  a transthoracic echocardiogram revealed an ejection fraction of 35-45%.  He  underwent TEE which revealed a left atrial appendage thrombus, and he was  therefore anticoagulated with expecting Cardioversion.  He underwent a  repeat TEE on the 10/2005 that showed an ejection fraction that had  decreased to 15-25%, and there was now report of severe mitral  regurgitation.  The continued be a left atrial appendage thrombus.  Myoview  revealed no ischemia at that time.  He was continued on anticoagulation and  ultimately underwent cardioversion with amiodarone load.  We did repeat his  echocardiogram in July of this year, and the study was technically  difficult.  However, ejection fraction was thought to be around 35%, and  there is mild mitral regurgitation.  The patient has done reasonably well  since that time, until Labor Day when he began to have progressive dyspnea  on exertion and ultimately developed dyspnea at rest associated with  orthopnea and PND.  There has been no pedal edema.  He also has had chest  tightness that increases with lying flat and inspiration.  He now presents  to the emergency room.   Also of note, the patient has complained of some  numbness in his left upper extremity, but there is no dysarthria,  incontinence or loss of strength and  sensation his extremities.   ALLERGIES:  HE HAS NO KNOWN DRUG ALLERGIES.   MEDICATIONS:  At home include:  1. Lasix 60 mg p.o. b.i.d.  2. Digoxin 0.5 mg p.o. q.d.  3. Lisinopril 40 mg q.d.  4. Niaspan 1 gram p.o. q.h.s.  5. Doxazosin 8 mg p.o. q. day.  6. Potassium, 20 mEq p.o. q. day.  7. Lipitor 40 mg p.o. q. day.  8. Aspirin 81 mg p.o. q. day.  9. Toprol 100 p.o. b.i.d.  10.Multivitamin.  11.He also takes Amiodarone 200 mg p.o. q. day.   SOCIAL HISTORY:  He does smoke.  He lives in [place].   FAMILY HISTORY:  Is negative coronary disease.  Past medical history  significant for hypertension, hyperlipidemia.  There is no diabetes  mellitus.  He does have a history of coronary disease status post bypassing  grafts, as well as an ischemic cardiomyopathy, mitral regurgitation and  atrial fibrillation.  He has had a history of nephrolithiasis as well.  He  has had a prior left carotid endarterectomy.   REVIEW OF SYSTEMS:  She denies any headaches or fevers or chills.  There is  no productive cough or hemoptysis.  There is no dysphagia, odynophagia,  melena, hematochezia.  There is no dysuria or hematuria.  No evidence of  seizure activity.  There is orthopnea and PND, but there is no pedal edema.  There is no claudication noted.  The remaining systems are negative.   PHYSICAL EXAMINATION:  VITAL SIGNS:  His pulse is 49, and his blood pressure  is 170/80.  GENERAL:  He is well-developed, well-nourished and does not attributed to  stress.  SKIN:  Warm and dry.  He does not appear to be depressed.  There is no  peripheral clubbing.  HEENT:  Is unremarkable.  NECK:  His neck is supple with a normal upstroke bilaterally.  I could not  appreciate bruits.  There is no jugular venous distention, and I cannot  appreciate  thyromegaly.  CHEST:  Clear to auscultation, normal expansion.  CARDIOVASCULAR:  Reveals a bradycardic rate with regular rhythm.  His heart  sounds are distant, but I cannot appreciate murmurs or gallop.  ABDOMEN:  Nontender, nondistended, positive bowel sounds.  No  hepatosplenomegaly, no mass appreciated.  There is no abdominal bruits.  He  has 2+ femoral pulses bilaterally.  No bruits.  EXTREMITIES:  Show no edema.  I can palpate no cords.  He has 2+ posterior  tibial pulses bilaterally.  NEUROLOGIC:  Exam is grossly intact.  His telemetry shows a sinus  bradycardia with a left bundle branch block.   DIAGNOSES:  1. Congestive heart failure.  2. Ischemic cardiomyopathy.  3. Coronary artery disease, status post coronary artery bypassing graft.  4. History of mitral regurgitation.  5. History of atrial fibrillation, holding in sinus rhythm, on amiodarone      therapy.  6. Coumadin therapy.  7. History of carotid endarterectomy.  8. Hypertension.  9. Hyperlipidemia.  10.Tobacco abuse.  11.Tingling sensation in left upper extremity.   PLAN:  Mr. Tierce presents for evaluation of probable congestive heart  failure.  We will admit and cycle enzymes.  I doubt ischemia, based on his  description of his chest pain.  It sounds to be more pleuritic and possibly  related to volume overload.  Will also check a BNP along with other  screening laboratories.  We will continue his ACE inhibitor, Digoxin and  Amiodarone.  I would decrease his Toprol to 100 mg p.o. q. day, secondary to  his bradycardia.  We will add Norvasc for his elevated blood pressure, and  will also increase his Lasix to 80 mg IV b.i.d. for diuresis.  We need to  follow his renal function closely.  We will plan to repeat his  echocardiogram tomorrow morning to quantify his ejection fraction also his  mitral regurgitation.  If his MR has worsened, then we may need to consider repair in the future.  We will hold his Coumadin  for now and begin IV  Heparin.  I will check a head CT to rule out bleed, secondary to his arm  numbness.  He may also need right left heart catheterization.           ______________________________  Madolyn Frieze [name], MD, [facility]     BSC/MEDQ  D:  10/04/2006  T:  10/05/2006  Job:  [id number]

## 2011-04-28 NOTE — Assessment & Plan Note (Signed)
[facility]                               PULMONARY OFFICE NOTE   NAME:[name], [name] WIEMERS                    MRN:          [id number]  DATE:10/26/2006                            DOB:          11-06-1941    HISTORY OF PRESENT ILLNESS:  I saw [name] [name] in follow up today for his  COPD and tobacco abuse.  He says he still is having some degree of shortness  of breath with exertion, but seems to gain some benefit from the use of his  Spiriva and Xopenex.  He is currently on Chantix as well for smoking  cessation and says he has not had any cigarettes since his hospital  discharge.  Again, he did undergo pulmonary function tests in the hospital  which showed moderate to severe airway obstruction with a decreased  diffusion capacity.  He had also undergone cardiopulmonary exercise testing  on November 7 which was consistent with mild functional limitation due to  heart failure and obesity.   MEDICATIONS:  Medication list was reviewed.   PHYSICAL EXAMINATION:  VITAL SIGNS:  Weight 264 pounds, temperature 97.8,  blood pressure 140/80, heart rate 54, oxygen saturation 96% on room air.  HEENT:  No sinus tenderness.  No nasal discharge.  No oral lesions.  No  lymphadenopathy.  HEART:  Regular S1, S2, irregular.  CHEST:  No wheezing or rales.  ABDOMEN:  Obese, soft, nontender.  EXTREMITIES:  No edema.   IMPRESSION:  1. Moderate chronic obstructive pulmonary disease.  He is to continue on      Spiriva and Xopenex.  2. Dyspnea on exertion.  Based on review of his cardiopulmonary exercise      test, this appeared mostly due to his obesity deconditioning and heart      failure although his COPD may be contributing to some degree.  I have      discussed with him the importance of diet, exercise and weight      reduction, and he is to follow up with Cardiology for further      management of his ischemic cardiomyopathy with systolic congestive      heart  failure as well as hypertension, mitral regurgitation and atrial      fibrillation and coronary artery disease.  3. Tobacco abuse.  He is currently on Chantix for his smoking cessation.      He says he has not had a cigarette since his hospital discharge.  4. He was administered an influenza as well as a pneumococcal vaccination      in our office.   FOLLOWUP:  Follow up in six months.     Coralyn Helling, MD  Electronically Signed   VS/MedQ  DD: 10/29/2006  DT: 10/30/2006  Job #: [id number]   cc:   Madolyn Frieze. [name], MD, [facility]

## 2011-04-28 NOTE — Cardiovascular Report (Signed)
[name] [name], [name] [name]             ACCOUNT NO.:  [id number]   MEDICAL RECORD NO.:  [id number]          PATIENT TYPE:  INP   LOCATION:  2029                         FACILITY:  [facility]   PHYSICIAN:  Salvadore Farber, MD  DATE OF BIRTH:  1941-10-24   DATE OF PROCEDURE:  10/08/2006  DATE OF DISCHARGE:                              CARDIAC CATHETERIZATION   PROCEDURE:  1. Left and right heart catheterization.  2. Left ventriculography.  3. Coronary angiography.  4. Saphenous vein graft and left internal mammary artery graft      angiography.  5. StarClose closure of the right common femoral arteriotomy site.   INDICATIONS:  [name] [name] is a 70 year old gentleman status post coronary  artery bypass grafting in 1998.  He has longstanding dyspnea which has been  worse over the past 6 weeks prompting hospitalization.  BNP was moderately  elevated at 238.  It is also felt that emphysema may be a contributor to his  dyspnea.  He is referred for diagnostic angiography to exclude coronary  artery disease as a contributor to his heart failure and dyspnea.   PROCEDURAL TECHNIQUE:  Informed consent was obtained.  Underwent some  lidocaine local anesthesia, a 5-French sheath was placed in the right common  femoral artery and a 7-French sheath in the right common femoral vein using  the modified Seldinger technique.  Right heart catheterization was performed  using a balloon-tipped catheter via the venous sheath.  Left heart  catheterization and ventriculography were performed using a pigtail  catheter.  Coronary angiography of the native vessels was performed using JL-  4 and JR-4 catheters.  The JR-4 catheter was also used to selectively engage  each vein graft.  Despite attempting with multiple catheters, I was unable  to selectively engage the left internal mammary artery.  I, therefore,  performed nonselective LIMA angiography which did provide adequate  visualization of the LIMA and LAD.   The arteriotomy was then closed using a StarClose device.  Complete  hemostasis was obtained.  Venous sheath was removed and manual compression  applied.  Complete hemostasis was obtained.  He was then transferred to the  holding room in stable condition having tolerated the procedure well.   COMPLICATIONS:  None.   FINDINGS:  Hemodynamics:  RA 11/20/09, RV 34/6/10, PA 33/16/23, PCW  16/15/14, LV 157/11/13, aorta 157/72/103, cardiac output/index (Fick)  4.6/1.9.  There is no aortic stenosis or mitral stenosis.  1. Ventriculography: Ejection fraction approximately 45% with mild global      hypokinesis.  There is trace mitral regurgitation.   CORONARY ANGIOGRAPHY:  1. Left main:  Angiographically normal.  2. LAD:  The vessel is occluded after the origin of a single septal      perforator.  The mid and distal vessel are supplied via widely patent      LIMA graft with excellent distal runoff.  There is a widely patent      graft to a moderate-size diagonal also with excellent distal runoff.  3. Circumflex:  Moderate-size vessel giving rise to three marginals.  The      first marginal  is small.  The AV groove circumflex has a 95% stenosis      in its mid section.  There is a sequential vein graft to the second and      third marginals with good runoff.  4. RCA:  The vessel is occluded proximally.  There is a widely patent vein      graft to the distal PDA.  5. Left subclavian artery is angiographically normal.   IMPRESSION AND PLAN:  Widely patent bypass grafts with mildly impaired left  ventricular systolic function, normal left ventricular end-diastolic  pressure and mildly elevated right heart pressures.  Based on these  findings, I suspect he has either had heart failure on presentation, which  has subsequent been effectively treated, or his dyspnea is primarily due to  his emphysema.      Salvadore Farber, MD  Electronically Signed     WED/MEDQ  D:  10/08/2006  T:   10/08/2006  Job:  [id number]   cc:   Madolyn Frieze. [name], MD, [facility]

## 2011-04-28 NOTE — Discharge Summary (Signed)
NAME:  [name] [name], [name] [name]                       ACCOUNT NO.:  [id number]   MEDICAL RECORD NO.:  [id number]                   PATIENT TYPE:  INP   LOCATION:  3311                                 FACILITY:  [facility]   PHYSICIAN:  Balinda Quails, M.D.                 DATE OF BIRTH:  [birth year]   DATE OF ADMISSION:  06/28/2004  DATE OF DISCHARGE:  06/29/2004                                 DISCHARGE SUMMARY   ADMISSION DIAGNOSES:  Severe left internal carotid artery stenosis.   ADDITIONAL DIAGNOSES/ DISCHARGE DIAGNOSES:  1. Severe left internal carotid artery stenosis, status post left carotid     endarterectomy completed on June 28, 2004.  2. History of coronary artery disease status post coronary artery bypass     graft in 1998.  3. History of hypertension.  4. Hyperlipidemia.  5. History of tobacco abuse.   HOSPITAL MANAGEMENT/ PROCEDURES:  Left carotid endarterectomy with primary  arteriotomy closure, completed by [name] [name] on June 28, 2004.   CONSULTATIONS:  Smoking cessation counseling.   HISTORY OF PRESENT ILLNESS:  [name] [name] is a 70 year old male who had been  followed by [facility].  The patient had undergone coronary  artery bypass graft in 1998 by [name] [name] for coronary artery disease.  The patient was found to have a left carotid bruits during recent  evaluation.  A carotid Doppler evaluation revealed severe left internal  carotid stenosis in the mid ICA associated with mild to moderate plaque.  The patient had no evidence of significant right ICA disease.   The patient was referred to [name] [name] of [facility] for further evaluation for  surgical intervention.   The patient was seen and examined in consultation by [name] [name] on June 06, 2004.  At that time, the patient denied any symptoms related to his carotid  distribution.  He denied any sensory, motor or visual deficit.  He denied  any dizziness or unsteadiness of gait. He denied any speech problems.  A  carotid Doppler evaluation revealed 80 to 99% left internal carotid artery  stenosis with minimal right internal carotid disease.  There was antegrade  vertebral flow present bilaterally.  [name] [name]' impression was that the  patient did indeed have asymptomatic left internal carotid stenosis.  He  felt the patient would benefit for elective carotid endarterectomy for  reducing risk of stroke.  The risks, benefits and alternatives to the  procedure we discussed with the patient and his family at that time.  They  were in understanding and agreed to proceed with surgery.   HOSPITAL COURSE:  [name] [name] was admitted electively to [facility]  on June 28, 2004.  The patient was taken to the operating room and underwent  left carotid endarterectomy with primary arteriotomy closure completed by  [name] [name].  Overall, the patient tolerated this procedure well and was  extubated on the operating room  table.  He was then transferred to the post  anesthesia care unit in stable condition.  Once awake, alert and  appropriate, the patient was then transferred to 3300 for further  management.   The patient continued to make steady progress postoperative.  He awoke from  anesthesia without any neurologic deficits.  He remained afebrile and  hemodynamically stable overnight.  On postoperative day, #1, the patient was  feeling quite well and had no concerns or complaints.  He denied any chest  pain, shortness of breath, nausea or vomiting.  He was tolerating a regular  diet and had ambulated independently without difficulty.  His vital signs  were as follows:  He was afebrile.  His blood pressure was 130s to 140s/ 50s  to 60s.  Pulse was in the 50s and regular.  Respirations were 20 and  unlabored.  SPO2 was 92% on room air.  The patient had good urine output  with 600 cc in an 8 hour period.   PHYSICAL EXAMINATION:  VITAL SIGNS:  Heart was in a regular rate and rhythm,  reading sinus bradycardia  to normal sinus rhythm on telemetry.  LUNGS:  Revealed faint bibasilar crackles, otherwise were clear.  ABDOMEN:  Obese, soft, nontender, nondistended with good bowel sounds.  EXTREMITIES:  Without edema.  He had 2+ posterior tibial pulses bilaterally.  NECK:  The patient's left neck incision was clean, dry and intact without  any erythema, swelling or drainage.  NEUROLOGIC:  The patient was neurologically intact without any focal  deficits.   The patient had resumed normal bowel and bladder function.  He was  tolerating regular diet.  His pain was well controlled with oral  medications.   While in the hospital, a smoking cessation consultation was initiated.  The  patient stated he did not want to talk to the smoking cessation counselor  and refused to quit smoking.  The associated risks and complications of  continuing to smoke were discussed in detail with the patient, and he  understood these risks.  He was instructed to continue his pulmonary toilet  at discharge to reduce his risk of postoperative pneumonia.  The patient was  strongly encouraged to consider reducing the amount of smoking or to quit  altogether.   LABORATORY DATA:  Lab values at the time of discharge were as follows:  BMET  reads sodium 140, potassium 4.3, chloride of 110, CO2 26, BUN 11, creatinine  1.1, glucose 114.  CBC reads WBC 9.3, hemoglobin 12.6, hematocrit 36.6 and  glucose of 187.   DISPOSITION:  [name] [name] is being discharge to home on June 29, 2004, in  improved and stable condition.   DISCHARGE MEDICATIONS:  1. Hydrochlorothiazide 12.5 mg p.o. daily.  2. Toprol XL 50 mg p.o. daily.  3. Klor-Con 30 mEq p.o. daily.  4. Doxazosin 8 mg p.o. daily.  5. Niaspan 500 mg p.o. b.i.d.  6. Lisinopril 20 mg p.o. daily.  7. Lipitor 20 mg p.o. daily.  8. Diltiazem 180 mg p.o. daily.  9. Aspirin 325 mg p.o. daily.  10.      Multivitamin one tablet p.o. daily. 11.      Tylox one or two tablets every four to  six hours as needed for     pain.   DISCHARGE INSTRUCTIONS:  1. Activity:  The patient should avoid driving.  He should avoid heavy     lifting or strenuous activity.  He should continue to walk daily.  2. Diet:  The patient to follow a low-fat, low-cholesterol, heart-healthy     diet.  3. Wound-care:  The patient may shower starting June 30, 2004.  He is to     wash his incisions daily with soap and water.  He is to notify the [facility]     office if he has any redness, swelling or drainage from his incision, or     a temperature of greater than 101.0.   FOLLOWUP APPOINTMENTS:  The patient is to follow up with [name] [name] within  two weeks of discharge.  The [facility] office will call the patient with the  exact date and time.      Carolyn A. Arlean Hopping, M.D.    CAF/MEDQ  D:  07/06/2004  T:  07/06/2004  Job:  [id number]   cc:   Salvadore Farber, M.D. [facility]  [address]  [address]  [place]  [place] [address]   Janeece Riggers. Karin Golden, M.D.  [phone], [address]  [place]  [place] [address]  Fax: [phone]

## 2011-04-28 NOTE — Discharge Summary (Signed)
[name] [name], [name] [name]             ACCOUNT NO.:  [id number]   MEDICAL RECORD NO.:  [id number]          PATIENT TYPE:  INP   LOCATION:  2029                         FACILITY:  [facility]   PHYSICIAN:  Madolyn Frieze. [name], MD, FACCDATE OF BIRTH:  1941/01/23   DATE OF ADMISSION:  10/04/2006  DATE OF DISCHARGE:  10/09/2006                                 DISCHARGE SUMMARY   BRIEF HISTORY:  Mr. Fenoglio is a 70 year old white male who presented with  progressive dyspnea on exertion, which progressed to dyspnea at rest  associated with orthopnea and PND.  He denied pedal edema.  He also  describes some chest discomfort that increased with lying flat and with  inspiration.  Thus, his presentation to the emergency room.   PAST MEDICAL HISTORY:  1. Continued tobacco use.  2. Known coronary artery disease with bypass surgery.  3. Ischemic cardiomyopathy.  4. Mitral regurgitation.  5. Carotid endarterectomy.  6. Atrial fibrillation.  7. Hypertension.  8. Hyperlipidemia.  9. Congestive heart failure.   Last echocardiogram showed an EF of 35-45% in August 2006.  TEE in November  2006 showed an EF of 15-25% with severe MR and a left atrial thrombus.  He  ultimately underwent cardioversion after amiodarone loading.  Echocardiogram  in July 2007 showed an EF of 35%.   LABORATORY DATA:  Chest x-ray on the 25th shows cardiac enlargement, no  acute pulmonary findings.  Head CT showed no acute intracranial abnormality,  patchy white matter disease likely due to microvascular ischemic change,  tiny lacunar infarcts in the left basilar ganglia region, vascular  calcifications.   Admission weight 254.8 pounds, 115.6 kg.  Discharge weight was 117.4 kg.  Admission H&H was 14.7 and 42.8, normal indices, platelets 163, WBC 9.7.  prior to discharge H&H was 14.5 and 41.2, normal indices, platelets 158, WBC  8.4.  ESR on the 30th was 5.  Admission PT was 23.8 with an INR of 2.0.  Coumadin was initially held for  anticipation of cardiac catheterization.  At  time of discharge on the 30th PT was 15.1, INR of 1.2.  Admission sodium  144, potassium 3.5 , BUN 15, creatinine 1.2, glucose 96, normal LFTs.  Subsequent chemistries did show hypokalemia at a low of 2.9 on the 27th.  Prior to discharge sodium was 144, potassium 3.4, BUN 13, creatinine 1.1,  glucose 97.  CK, MBs, relative indices and troponins were within normal  limits.  Admission BNP was 238 and on the 27 146.  Fasting lipids on the  25th showed a total cholesterol of 135, triglycerides 273, HDL 31, LDL 49.  TSH was 0.477.  Repeat echocardiogram on the 26th showed EF of 30-35%,  inadequate for wall motion abnormalities, dyssynergy of the interventricular  septum consistent with conduction abnormality or paced rhythm, mild aortic  root dilatation, left atrial enlargement.  EKGs showed baseline artifact,  sinus rhythm, sinus bradycardia, left bundle branch block   HOSPITAL COURSE:  Mr. Waldschmidt was admitted to [facility].  He was  continued on his home medications as well as IV heparin.  Coumadin was held  in anticipation of cardiac catheterization.  Head CT was performed prior to  initializing heparin to rule out bleed secondary to his arm numbness.  Overnight he continued to have some chest tightness with expiration and mild  dyspnea and some mild expiratory wheezing.  Dr. [name] did not feel he  appeared volume-overloaded, especially with a BNP that was only mildly  elevated.  He felt that his shortness of breath was multifactorial secondary  to obesity, CHF and COPD.  Gentle diuresis and potassium supplementation  were pursued in anticipation of cardiac catheterization when INR  subtherapeutic.  Pulmonary consultation was also sought on October 05, 2006,  with Dr. Craige Cotta for further evaluation of his chronic dyspnea.  Inhalers were  added.  Tobacco cessation was also performed; however, the patient informed  Clydie Braun that he did not  think he would quit.  Pulmonary had PFTs performed.  Unconfirmed results showed moderate severe airway obstruction with a  diffusion defect suggestive of ischemia.  Absence of overinflation indicated  a concurrent restrictive process, which may account for the diffusion  defect.  There is a reversible component.  Norvasc was increased to 10 mg  daily for his hypertension.  By October 29 INR was decreased.  Cardiac  catheterization was performed by Dr. Samule Ohm.  According to Dr. Melinda Crutch  note, the patient has three-vessel coronary artery disease, LIMA to the LAD,  saphenous vein graft to the diagonal, saphenous vein graft to the OM-1 and  OM-2, and saphenous vein graft to the PDA were patent.  Normal subclavian.  EF of 45% with mild global hypokinesis.  Dr. Samule Ohm felt that his right  heart pressures were mildly elevated with a normal LVEDP.  He did not feel  CHF was a likely major contributor to his dyspnea.  Progression nurse  assisted with discharge plans.  Pulmonary felt that he could be discharged  home with outpatient follow-up.  EP consultation was sought with Loura Pardon  and Dr. Graciela Husbands in regard to his ischemic cardiomyopathy.  Dr. Graciela Husbands felt that  the patient should undergo CPS testing as well and consider MRI for EF  adjunctation and consider CT for evaluation of amiodarone lung toxicity.  Dr. [name] evaluated the patient on the 30th and felt that the patient  could be discharged home.  Prior to discharge, cardiac rehab ambulated the  patient, documenting that his heart rate increased from a resting heart rate  of 60 to approximately 92.  Saturations on room air were maintained between  92 and 95% throughout his walk.  On discussion with cardiac rehab, the  patient stated that he was willing to quit smoking.   DISCHARGE DIAGNOSES:  1. Shortness of breath, multifactorial, secondary to chronic obstructive      pulmonary disease exacerbation, ischemic cardiomyopathy.  2. Tobacco  use. 3. Hypokalemia.  4. Hypertension.  5. History of atrial fibrillation with anticoagulation on Coumadin.  6. Hyperlipidemia.  7. History as noted above.   PROCEDURES PERFORMED:  1. Cardiac catheterization by Dr. Samule Ohm on October 08, 2006.  2. Pulmonary function tests by pulmonology on October 05, 2006.   DISPOSITION:  Mr. Catalina Pizza is discharged home.  He is asked to maintain a low  salt, fat, cholesterol diet.  He is asked to weigh daily and to bring all  medicines and weights to his follow-up office appointments.  He was advised  no smoking or tobacco products.   New medications include:  1. Spiriva 18 mcg one puff daily.  2. Xopenex 45  mcg two puffs b.i.d.  3. Chantix 1 mg half a tablet b.i.d. for 3 days, then and one tablet      t.i.d.  4. His Toprol was decreased to 100 mg daily.  5. His Lasix was decreased to 40 mg b.i.d.  6. His potassium was increased to 20 mEq b.i.d.   He was asked to continue:  1. Niaspan 1000 mg daily.  2. Lipitor 40 mg q.h.s.  3. Coumadin 5 mg daily except for 7.5 mg on Tuesday, Thursday and      Saturday.  He was asked to take 10 mg today and then to continue his      usual regimen/  4. Doxazosin 8 mg daily.  5. Lisinopril 40 mg daily.  6. Aspirin 81 mg daily.  7. Multivitamin daily.  8. Amiodarone 200 mg daily.  9. Norvasc 10 mg daily.  10.Nitroglycerin 0.4 mg p.r.n.   He will have a PT/INR and BMET on October 15, 2006, at 2:30.  he will have a  CPS testing performed on October 17, 2006, at 10:45 and asked to report to  [facility] Admitting.  He will follow up with Dr. [name] on October 23, 2006, at 10 a.m. and with Dr. Craige Cotta on October 26, 2006, at 10:00 a.m.   DISCHARGE TIME:  Greater than 30 minutes.     ______________________________  Joellyn Rued, PA-C    ______________________________  Madolyn Frieze. [name], MD, [facility]    EW/MEDQ  D:  10/09/2006  T:  10/09/2006  Job:  [id number]   cc:   Coralyn Helling, MD

## 2011-04-28 NOTE — Consult Note (Signed)
NAME:  [name] [name], [name] [name]                       ACCOUNT NO.:  [id number]   MEDICAL RECORD NO.:  [id number]                   PATIENT TYPE:  INP   LOCATION:  NA                                   FACILITY:  [facility]   PHYSICIAN:  Mark E. Karin Golden, M.D.                DATE OF BIRTH:  Aug 07, 1941   DATE OF CONSULTATION:  06/26/2004  DATE OF DISCHARGE:                                   CONSULTATION   REASON FOR CONSULTATION:  This is a consultation for interpretation of  intracranial views from a cerebral arteriogram done on June 22, 2004, by  Drs. Samule Ohm and [place].   HISTORY:  Left carotid bruit with severe carotid stenosis.   Left internal carotid arteriogram:  This vessel was opacified via a common  carotid injection.  There is no siphon stenosis.  Flow from this injection  supplies the left, middle, and anterior cerebral arteries.  The patient has  a patent posterior communicating artery which is intermittently opacified.  There is a rounded appearance on the lateral view at the origin that could  represent a 3 to 3.5 mm aneurysm.  This could be a loop of the proximal  vessel and it is difficult to sort that out accurately.  I think one could  evaluate this further with either repeat angiography with oblique views or  MRA.   Beyond that, the vessels appear normal.  The parenchymal and venous phases  are normal.   Right internal carotid arteriogram:  This vessel was opacified via a common  carotid injection.  Carotid siphon was widely patent without stenosis.  Flow  from this injection supplies the right, middle, and anterior cerebral artery  territories which appear normal without proximal stenosis, aneurysm, or  vascular malformation.  There is a patent posterior communicating artery on  this side as well.  The parenchymal and venous phases are normal.   IMPRESSION:  Normal anterior intracranial circulation with the exception of  an indeterminate appearance of the posterior  communicating artery on the  left.  There is a 3 mm rounded structure at the origin.  I cannot tell if  this is a posterior communicating artery aneurysm or a loop of the vessel.  An aneurysm is certainly not excluded, and one could evaluate this further  with either a repeat arteriography or possibly with magnetic resonance  angiography.                                               Mark E. Karin Golden, M.D.    MES/MEDQ  D:  06/26/2004  T:  06/26/2004  Job:  [id number]

## 2011-05-15 ENCOUNTER — Ambulatory Visit (INDEPENDENT_AMBULATORY_CARE_PROVIDER_SITE_OTHER): Payer: Medicare Other | Admitting: *Deleted

## 2011-05-15 DIAGNOSIS — I4891 Unspecified atrial fibrillation: Secondary | ICD-10-CM

## 2011-05-15 DIAGNOSIS — Z7901 Long term (current) use of anticoagulants: Secondary | ICD-10-CM

## 2011-05-15 LAB — POCT INR: INR: 3.5

## 2011-06-19 ENCOUNTER — Ambulatory Visit (INDEPENDENT_AMBULATORY_CARE_PROVIDER_SITE_OTHER): Payer: Medicare Other | Admitting: *Deleted

## 2011-06-19 DIAGNOSIS — Z7901 Long term (current) use of anticoagulants: Secondary | ICD-10-CM

## 2011-06-19 DIAGNOSIS — I4891 Unspecified atrial fibrillation: Secondary | ICD-10-CM

## 2011-06-19 LAB — POCT INR: INR: 3.9

## 2011-07-09 ENCOUNTER — Emergency Department (HOSPITAL_COMMUNITY): Payer: Medicare Other

## 2011-07-09 ENCOUNTER — Inpatient Hospital Stay (HOSPITAL_COMMUNITY)
Admission: EM | Admit: 2011-07-09 | Discharge: 2011-07-10 | DRG: 313 | Disposition: A | Discharge status: Home / Self Care | Payer: Medicare Other | Attending: Cardiology | Admitting: Cardiology

## 2011-07-09 DIAGNOSIS — I4891 Unspecified atrial fibrillation: Secondary | ICD-10-CM | POA: Diagnosis present

## 2011-07-09 DIAGNOSIS — I2589 Other forms of chronic ischemic heart disease: Secondary | ICD-10-CM | POA: Diagnosis present

## 2011-07-09 DIAGNOSIS — J449 Chronic obstructive pulmonary disease, unspecified: Secondary | ICD-10-CM | POA: Diagnosis present

## 2011-07-09 DIAGNOSIS — R0789 Other chest pain: Principal | ICD-10-CM | POA: Diagnosis present

## 2011-07-09 DIAGNOSIS — J4489 Other specified chronic obstructive pulmonary disease: Secondary | ICD-10-CM | POA: Diagnosis present

## 2011-07-09 DIAGNOSIS — R079 Chest pain, unspecified: Secondary | ICD-10-CM

## 2011-07-09 DIAGNOSIS — I2510 Atherosclerotic heart disease of native coronary artery without angina pectoris: Secondary | ICD-10-CM | POA: Diagnosis present

## 2011-07-09 DIAGNOSIS — Z7901 Long term (current) use of anticoagulants: Secondary | ICD-10-CM

## 2011-07-09 DIAGNOSIS — E785 Hyperlipidemia, unspecified: Secondary | ICD-10-CM | POA: Diagnosis present

## 2011-07-09 DIAGNOSIS — Z7982 Long term (current) use of aspirin: Secondary | ICD-10-CM

## 2011-07-09 DIAGNOSIS — I10 Essential (primary) hypertension: Secondary | ICD-10-CM | POA: Diagnosis present

## 2011-07-09 DIAGNOSIS — Z9119 Patient's noncompliance with other medical treatment and regimen: Secondary | ICD-10-CM

## 2011-07-09 DIAGNOSIS — I252 Old myocardial infarction: Secondary | ICD-10-CM

## 2011-07-09 DIAGNOSIS — Z951 Presence of aortocoronary bypass graft: Secondary | ICD-10-CM

## 2011-07-09 DIAGNOSIS — Z91199 Patient's noncompliance with other medical treatment and regimen due to unspecified reason: Secondary | ICD-10-CM

## 2011-07-09 LAB — CARDIAC PANEL(CRET KIN+CKTOT+MB+TROPI)
Relative Index: 2 (ref 0.0–2.5)
Troponin I: <0.3 ng/mL (ref <0.30)

## 2011-07-09 LAB — PROTIME-INR
INR: 2.24 — ABNORMAL HIGH (ref 0.00–1.49)
Prothrombin Time: 25.2 seconds — ABNORMAL HIGH (ref 11.6–15.2)

## 2011-07-09 LAB — BASIC METABOLIC PANEL
BUN: 18 mg/dL (ref 6–23)
CO2: 25 mEq/L (ref 19–32)
Calcium: 9.1 mg/dL (ref 8.4–10.5)
Chloride: 108 mEq/L (ref 96–112)
Creatinine, Ser: 1.26 mg/dL (ref 0.50–1.35)
Glucose, Bld: 100 mg/dL — ABNORMAL HIGH (ref 70–99)

## 2011-07-09 LAB — DIFFERENTIAL
Basophils Absolute: 0.1 10*3/uL (ref 0.0–0.1)
Basophils Relative: 1 % (ref 0–1)
Eosinophils Absolute: 0.5 10*3/uL (ref 0.0–0.7)
Eosinophils Relative: 8 % — ABNORMAL HIGH (ref 0–5)
Lymphs Abs: 1.9 10*3/uL (ref 0.7–4.0)
Neutrophils Relative %: 53 % (ref 43–77)

## 2011-07-09 LAB — TROPONIN I: Troponin I: <0.3 ng/mL (ref <0.30)

## 2011-07-09 LAB — CK TOTAL AND CKMB (NOT AT ARMC): Total CK: 125 U/L (ref 7–232)

## 2011-07-09 LAB — PRO B NATRIURETIC PEPTIDE: Pro B Natriuretic peptide (BNP): 430.2 pg/mL — ABNORMAL HIGH (ref 0–125)

## 2011-07-09 LAB — CBC
MCV: 92.3 fL (ref 78.0–100.0)
Platelets: 137 10*3/uL — ABNORMAL LOW (ref 150–400)
RBC: 4.39 MIL/uL (ref 4.22–5.81)
RDW: 14.9 % (ref 11.5–15.5)
WBC: 6.4 10*3/uL (ref 4.0–10.5)

## 2011-07-09 LAB — MRSA PCR SCREENING: MRSA by PCR: NEGATIVE

## 2011-07-10 ENCOUNTER — Encounter: Payer: Medicare Other | Admitting: *Deleted

## 2011-07-10 LAB — COMPREHENSIVE METABOLIC PANEL
ALT: 16 U/L (ref 0–53)
Albumin: 3.5 g/dL (ref 3.5–5.2)
Alkaline Phosphatase: 80 U/L (ref 39–117)
Chloride: 111 mEq/L (ref 96–112)
GFR calc Af Amer: >60 mL/min (ref >60)
Glucose, Bld: 99 mg/dL (ref 70–99)
Potassium: 3.7 mEq/L (ref 3.5–5.1)
Sodium: 145 mEq/L (ref 135–145)
Total Bilirubin: 0.5 mg/dL (ref 0.3–1.2)
Total Protein: 6.2 g/dL (ref 6.0–8.3)

## 2011-07-10 LAB — HEMOGLOBIN A1C: Hgb A1c MFr Bld: 5.9 % — ABNORMAL HIGH (ref <5.7)

## 2011-07-10 LAB — LIPID PANEL
Total CHOL/HDL Ratio: 3.8 RATIO
VLDL: 37 mg/dL (ref 0–40)

## 2011-07-10 LAB — PROTIME-INR: Prothrombin Time: 26.2 seconds — ABNORMAL HIGH (ref 11.6–15.2)

## 2011-07-10 LAB — CARDIAC PANEL(CRET KIN+CKTOT+MB+TROPI): CK, MB: 2.2 ng/mL (ref 0.3–4.0)

## 2011-07-10 LAB — TSH: TSH: 0.552 u[IU]/mL (ref 0.350–4.500)

## 2011-07-17 NOTE — Discharge Summary (Addendum)
[name] [name]             ACCOUNT NO.:  [id number]  MEDICAL RECORD NO.:  [id number]  LOCATION:  3307                         FACILITY:  [facility]  PHYSICIAN:  Madolyn Frieze. [name], MD, FACCDATE OF BIRTH:  Apr 09, 1941  DATE OF ADMISSION:  07/09/2011 DATE OF DISCHARGE:  07/10/2011                              DISCHARGE SUMMARY   DISCHARGE DIAGNOSES: 1. Chest pain, felt musculoskeletal.     a.     Initiated on 5-day course of naproxen. 2. Coronary artery disease status post coronary artery bypass graft in     1998. 3. Chronic obstructive pulmonary disease. 4. Remote history of tobacco abuse. 5. Status post Myoview, November 2010 showing prior inferior and     apical infarct with mild peri-infarct ischemia, EF 44%. 6. Cerebrovascular disease status post left carotid endarterectomy. 7. 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 8. History of atrial fibrillation, on Coumadin. 9. Hyperlipidemia. 10.Hypertension. 11.History of mitral insufficiency. 12.Nephrolithiasis. 13.Ischemic cardiomyopathy.  HOSPITAL COURSE:  [name] [name] is a 70 year old gentleman with history of CAD status post CABG, who presented to [facility] with complaints of chest pain.  He has been moving a pressure washer which estimated to be between 80 and 100 pounds on July 05, 2011.  After that, he had upper abdominal and substernal chest pain, worse with deep inspiration.  He had been slightly diaphoretic at times, but denied any nausea or vomiting.  He had no recent history of the symptoms.  He was out of town, did not seek medical attention.  When he has came home, symptoms have not resolved, he called EMS.  Nitroglycerin was administered, which was not felt to have made any different.  EKG demonstrates sinus rhythm with sinus bradycardia.  Left bundle-branch block which was known to be old.  His pain was felt  to be musculoskeletal in etiology, current for 4 days after heavy lifting. However, given his history, he was admitted for observation and rule out.  Cardiac enzymes were cycled, which were negative x3.  His Coumadin was held in case of the event that he would require cardiac catheterization.  However, this was resumed at discharge given his normal cardiac markers.  The patient was initiated on a trial of Naprosyn for musculoskeletal chest pain.  He is doing well today.  Dr. [name] seen and examined him and feels he is stable for discharge.  DISCHARGE LABS:  Total cholesterol 130, triglycerides 185, HDL 36, LDL 65.  TSH 0.552.  Cardiac enzymes negative x2.  A1c 5.9.  Sodium 145, potassium 3.7, chloride 111, CO2 27, glucose 99, BUN 15, creatinine 1.15.  LFTs were normal on July 10, 2011.  INR 2.36.  WBC 6.4, hemoglobin 13.4, hematocrit 45, platelet count 137.  STUDIES: 1. Chest x-ray, previous coronary bypass.  Cardiomegaly without CHF or     pneumonia. 2. CT of the head without contrast showed no evidence of acute     intracranial abnormalities.  Mild small vessel white matter     ischemic changes.  Chronic ethmoid and left frontal sinus disease.  DISCHARGE MEDICATIONS: 1. Naprosyn 500  mg b.i.d. for 5 days. 2. Nitroglycerin sublingual 0.4 mg every 5 minutes as needed up to     three doses for chest pain. 3. Amiodarone 200 mg daily. 4. Amlodipine 10 mg daily. 5. Aspirin 81 mg daily. 6. Doxazosin 8 mg daily. 7. Furosemide 40 mg daily. 8. Hydralazine 25 mg t.i.d. 9. Lisinopril 40 mg daily. 10.Metoprolol succinate 100 half tablet daily. 11.Multivitamin one tablet daily. 12.Niacin 500 mg two tablets b.i.d. 13.Potassium chloride 20 mEq two tablets t.i.d. 14.Pravachol 80 mg at bedtime. 15.Warfarin 5 mg 1.5 tablets on Mondays and Fridays and 1 tablet all     other days.  DISPOSITION:  [name] [name] will be discharged in stable condition to home.  He is to follow a low-sodium  heart-healthy diet and keep his IV site clean and dry.  He will follow up with Dr. Ludwig Clarks office July 24, 2011 at 9:30 and will see Tereso Newcomer, PA-C for his first post hospital appointment.  He will follow with Dr. [name] for his INR on August 10, 2011 at 1:30 p.m.  Thus, keeping in line with his monthly INR checks.  He is also instructed to stop naproxen and call his physician if he notices any stomach upset, unusual bleeding, or dark stool while taking this medicine.  He will also have a BMET at his follow-up visit to ensure stability of his creatinine given the NSAID use.  DURATION OF DISCHARGE ENCOUNTER:  Greater than 30 minutes including physician and PA time.     Dayna Dunn, P.A.C.   ______________________________ Madolyn Frieze. [name], MD, [facility]    DD/MEDQ  D:  07/10/2011  T:  07/10/2011  Job:  [id number]  cc:   [name], MD  Electronically Signed by Olga Millers MD [facility] on 07/17/2011 03:54:11 PM Electronically Signed by Ronie Spies  on 07/19/2011 11:26:19 AM

## 2011-07-21 ENCOUNTER — Encounter: Payer: Self-pay | Admitting: Physician Assistant

## 2011-07-24 ENCOUNTER — Other Ambulatory Visit (INDEPENDENT_AMBULATORY_CARE_PROVIDER_SITE_OTHER): Payer: Medicare Other | Admitting: *Deleted

## 2011-07-24 ENCOUNTER — Encounter: Payer: Self-pay | Admitting: Physician Assistant

## 2011-07-24 ENCOUNTER — Ambulatory Visit (INDEPENDENT_AMBULATORY_CARE_PROVIDER_SITE_OTHER): Payer: Medicare Other | Admitting: Physician Assistant

## 2011-07-24 VITALS — BP 142/70 | HR 52 | Ht 73.0 in | Wt 238.8 lb

## 2011-07-24 DIAGNOSIS — J449 Chronic obstructive pulmonary disease, unspecified: Secondary | ICD-10-CM

## 2011-07-24 DIAGNOSIS — E785 Hyperlipidemia, unspecified: Secondary | ICD-10-CM

## 2011-07-24 DIAGNOSIS — I6529 Occlusion and stenosis of unspecified carotid artery: Secondary | ICD-10-CM

## 2011-07-24 DIAGNOSIS — I4891 Unspecified atrial fibrillation: Secondary | ICD-10-CM

## 2011-07-24 DIAGNOSIS — I723 Aneurysm of iliac artery: Secondary | ICD-10-CM

## 2011-07-24 DIAGNOSIS — R0989 Other specified symptoms and signs involving the circulatory and respiratory systems: Secondary | ICD-10-CM

## 2011-07-24 DIAGNOSIS — I2510 Atherosclerotic heart disease of native coronary artery without angina pectoris: Secondary | ICD-10-CM

## 2011-07-24 DIAGNOSIS — R079 Chest pain, unspecified: Secondary | ICD-10-CM

## 2011-07-24 DIAGNOSIS — I2589 Other forms of chronic ischemic heart disease: Secondary | ICD-10-CM

## 2011-07-24 DIAGNOSIS — E7800 Pure hypercholesterolemia, unspecified: Secondary | ICD-10-CM

## 2011-07-24 DIAGNOSIS — I10 Essential (primary) hypertension: Secondary | ICD-10-CM

## 2011-07-24 LAB — BASIC METABOLIC PANEL
BUN: 13 mg/dL (ref 6–23)
CO2: 27 mEq/L (ref 19–32)
Chloride: 105 mEq/L (ref 96–112)
Glucose, Bld: 97 mg/dL (ref 70–99)
Potassium: 3.6 mEq/L (ref 3.5–5.1)

## 2011-07-24 NOTE — Progress Notes (Signed)
History of Present Illness: Primary Cardiologist: Dr. 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  He has a history of coronary artery disease, status post coronary artery bypassing graft, history of ischemic cardiomyopathy, improved by most recent echocardiogram as well as COPD. He also has a history of focal aortic dissection being managed medically. His last echocardiogram was performed in May 2009 showed normal LV function. His last catheterization in October 2007 showed patent grafts.  Note, he also has a left iliac artery aneurysm and mild cerebrovascular disease with history of carotid endarterectomy.  He is followed by vascular surgery (Dr. [name]) for these issues. Most recent Myoview was performed in November of 2010 and showed an ejection fraction of 44%, prior inferior and apical infarct with mild peri-infarct ischemia.  It was felt to be low risk and he was therefore treated medically.  He was admitted 7/29-7/30 with chest pain after lifting a pressure washer (80-100 pounds).  He ruled out for MI.  NTG did not help his pain.  It was felt to be MSK and he was treated briefly with NSAIDs.  He was set up for a BMET today to recheck his creatinine after taking NSAIDs for several days.  He denies any further chest pain.  No heaviness, tightness, pressure.  He has chronic DOE.  Describes Class 2b.  Has COPD.  He is not using any inhalers currently.  Denies orthopnea, PND, edema, syncope.    Labs 7/12:  Hgb 13.4, INR 2.36, K 3.7, Creat 1.15, ALT 16, A1C 5.9, CEs neg x 3, BNP 430, TC 138, TG 185, HDL 36, LDL 65, TSH 0.552.  CXR with CM, s/p CABG, non-acute.  Past Medical History  Diagnosis Date  . Atrial fibrillation     s/p prior DCCV;  Amiodarone Rx.  . CAD (coronary artery disease)     s/p CABG;  cath 10/07:  LM ok, LAD occluded, AV CFX 95%, pRCA occluded; L-LAD, S-OM2/OM3, S-PDA ok  . Cerebrovascular disease     s/p prior  Left CEA;  followed by Dr. [name]  . COPD (chronic obstructive pulmonary disease)   . Hyperlipidemia   . Hypertension   . Mitral insufficiency     hx of  . Nephrolithiasis   . Ischemic cardiomyopathy     echo 5/09: EF 55%, mild LAE;  Myoview 11/10: inf and apical scar with mild peri-infarct ischemia, EF 44%  . Iliac artery aneurysm, left     followed by Dr. [name]  . Aortic dissection     H/O focal aortic dissection  . LBBB (left bundle branch block)     Current Outpatient Prescriptions  Medication Sig Dispense Refill  . amiodarone (PACERONE) 200 MG tablet Take 200 mg by mouth daily.        [name] amLODipine (NORVASC) 10 MG tablet Take 10 mg by mouth daily.        [name] aspirin 81 MG tablet Take 81 mg by mouth daily.        [name] doxazosin (CARDURA) 8 MG tablet Take 8 mg by mouth at bedtime.        . furosemide (LASIX) 40 MG tablet Take 40 mg by mouth daily.        . hydrALAZINE (APRESOLINE) 25 MG tablet Take 25 mg by mouth 3 (three) times daily.        [name] lisinopril (PRINIVIL,ZESTRIL) 40 MG tablet Take 40 mg by mouth daily.        [name]  metoprolol (TOPROL-XL) 100 MG 24 hr tablet Take 50 mg by mouth daily.        . Multiple Vitamin (MULTIVITAMIN) tablet Take 1 tablet by mouth daily.        . niacin (NIASPAN) 500 MG CR tablet Take 1,000 mg by mouth 2 (two) times daily.        . nitroGLYCERIN (NITROSTAT) 0.4 MG SL tablet Place 0.4 mg under the tongue every 5 (five) minutes as needed.        . potassium chloride SA (KLOR-CON M20) 20 MEQ tablet Take 40 mEq by mouth 3 (three) times daily.        . pravastatin (PRAVACHOL) 80 MG tablet Take 80 mg by mouth daily.        [name] warfarin (COUMADIN) 5 MG tablet Take by mouth as directed.          Allergies: No Known Allergies  Vital Signs: BP 142/70  Pulse 52  Ht 6\' 1"  (1.854 m)  Wt 238 lb 12.8 oz (108.319 kg)  BMI 31.51 kg/m2 Repeat BP by me on right:  120/60  PHYSICAL EXAM: Well nourished, well developed, in no acute distress HEENT: normal Neck: no  JVD Vascular:  I cannot appreciate any carotid bruits Cardiac:  Distant heart sounds, S1, S2; RRR; no murmur Lungs:  Decreased breath sounds bilaterally, no wheezing, rhonchi or rales Abd: soft, nontender, no hepatomegaly Ext: no edema Skin: warm and dry Neuro:  CNs 2-12 intact, no focal abnormalities noted  EKG:  Sinus brady, HR 52, LBBB  ASSESSMENT AND PLAN:

## 2011-07-24 NOTE — Assessment & Plan Note (Signed)
Stable.  No angina.  Continue ASA and statin.  Last Myoview in 2010 low risk.  Follow up with Dr. [name] in 3 months.

## 2011-07-24 NOTE — Assessment & Plan Note (Signed)
Maintaining NSR.  TSH and LFTs ok hospital.  Continue Amiodarone and follow up with coumadin clinic later this month as scheduled.

## 2011-07-24 NOTE — Assessment & Plan Note (Signed)
Resolved.  This was MSK pain.  No further workup.

## 2011-07-24 NOTE — Assessment & Plan Note (Signed)
Follow-up with primary care

## 2011-07-24 NOTE — Assessment & Plan Note (Signed)
-   Follow up with vascular surgery as scheduled.

## 2011-07-24 NOTE — Assessment & Plan Note (Signed)
LDL optimal in the hospital.  Continue current meds.

## 2011-07-24 NOTE — Assessment & Plan Note (Signed)
Controlled.  Continue current therapy.  Check BMET today with recent NSAID use.

## 2011-07-24 NOTE — Patient Instructions (Signed)
Your physician recommends that you schedule a follow-up appointment in: 11/08/11 @ 11 am with Dr. [name]  Your physician recommends that you return for lab work in: TODAY BMET 414.01

## 2011-07-24 NOTE — Assessment & Plan Note (Signed)
No evidence of volume overload.  Continue current meds.

## 2011-07-25 ENCOUNTER — Telehealth: Payer: Self-pay | Admitting: *Deleted

## 2011-07-25 NOTE — Telephone Encounter (Signed)
Pt aware of lab results today. [name] [name]  

## 2011-07-26 NOTE — H&P (Signed)
NAMEFLAY, GHOSH             ACCOUNT NO.:  [id number]  MEDICAL RECORD NO.:  [id number]  LOCATION:  3307                         FACILITY:  [facility]  PHYSICIAN:  Jesse Sans. Canesha Tesfaye, MD, FACCDATE OF BIRTH:  December 07, 1941  DATE OF ADMISSION:  07/09/2011 DATE OF DISCHARGE:                             HISTORY & PHYSICAL   PRIMARY CARE PHYSICIAN:  Dr. Shary Decamp.  PRIMARY CARDIOLOGIST:  Madolyn Frieze. [name], MD, [facility]  CHIEF COMPLAINT:  Chest pain.  HISTORY OF PRESENT ILLNESS:  Mr. Trippe is a 70 year old male with a history of coronary artery disease.  He was moving a pressure washer which he estimates to be between 80 and 100 pounds on July 05, 2011. After that, he had upper abdominal and substernal chest pain.  It has been continuous between a 5 and an 8/10.  It is worse with deep inspiration.  When the pain reaches an 8/10, it makes him short of breath.  He has been slightly diaphoretic at times, but has had no nausea or vomiting.  He has no recent history of these symptoms.  He was out of town and did not seek medical attention.  When he came home and his symptoms had not resolved, he called EMS.  He took aspirin 81 mg x4 as instructed and EMS gave him sublingual nitroglycerin x1.  He does not feel that the nitroglycerin made any difference, but currently, his chest pain is only a 5/10.  PAST MEDICAL HISTORY: 1. Status post aortocoronary bypass surgery in 1998, with cardiac     catheterization in October 2007, showing LAD, circumflex 95% and     RCA occluded with LIMA to LAD patent, diagonal patent, SVG to OM-2     and OM-3 patent, and SVG to PDA patent. 2. Status post echocardiogram in May 2009, showing an EF of 55% with     an intraventricular conduction defect. 3. COPD. 4. Remote history of tobacco use. 5. Status post Myoview in November 2010, showing prior inferior and     apical infarct with mild peri-infarct ischemia, EF 44% (low-risk     study). 6. Cerebrovascular disease  status post left carotid endarterectomy. 7. 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 8. History of atrial fibrillation. 9. Hyperlipidemia. 10.Hypertension. 11.History of mitral insufficiency. 12.Nephrolithiasis. 13.Ischemic cardiomyopathy.  ALLERGIES:  No known drug allergies.  CURRENT MEDICATIONS: 1. Niacin 500 mg 3 tablets b.i.d. 2. Amlodipine 10 mg a day. 3. Doxazosin 8 mg a day. 4. Amiodarone 200 mg a day. 5. Lisinopril 40 mg a day. 6. Toprol-XL 100 mg 1/2 tablet daily. 7. Coumadin 5 mg 1 tablet daily except for 1-1/2 tablets on Monday and     Friday. 8. Multivitamins daily. 9. Aspirin 81 mg a day. 10.Lasix 40 mg a day. 11.Pravachol 80 mg at bedtime. 12.Hydralazine 25 mg t.i.d. 13.Potassium 10 mEq 2 tablets t.i.d.  SOCIAL HISTORY:  He lives in [place] with his wife.  He is a retired [employer] architect.  He quit tobacco in 2007, with approximately 40 pack-year history and denies alcohol or drug abuse.FAMILY HISTORY:  Both of his parents died in a motor vehicle accident when he was 70 years old and he was raised by grandparents.  He has got no siblings.  REVIEW OF SYSTEMS:  He is not aware of any weight change.  He denies urinary frequency.  He gets occasional arthralgias and joint pains.  He has had chest pain, but does not feel that he has had any reflux symptoms and he has upper abdominal pain, but denies any other abdominal symptoms.  He has not had any recent illnesses, fevers, or chills and no cough or cold symptoms.  Full 14-point review of systems is otherwise negative except as stated in the HPI.  PHYSICAL EXAMINATION:  VITAL SIGNS:  Temperature is 97.3, blood pressure 111/56, heart rate 56, respiratory rate 18, O2 saturation 94% on room air. GENERAL:  He is a well-developed, obese, disheveled male in no acute distress. HEENT:  Normal with the exception of  being edentulous and wearing dentures. NECK:  There is no lymphadenopathy, thyromegaly, bruit, or JVD noted. He has a left CEA scar that is well healed. CV:  His heart is regular in rate and rhythm with an S1, S2 and a paradoxical S2 without any significant murmur, rub, or gallop.  His distal pulses are intact. LUNGS:  Clear to auscultation bilaterally with decreased breath sounds throughout. SKIN:  No rashes or lesions are noted. ABDOMEN:  Distended with active bowel sounds, but no tenderness. EXTREMITIES:  He has 1+ lower extremity edema. MUSCULOSKELETAL:  There are no joint deformity or effusions. NEUROLOGIC:  Normal.  Chest x-ray shows previous bypass surgery.  EKG is sinus rhythm/sinus bradycardia with a left bundle-branch block which is old.  LABORATORY VALUES:  Hemoglobin 13.4, hematocrit 40.5, WBC 6.4, platelets 137, INR 2.24.  Sodium 143, potassium 3.8, chloride 108, CO2 of 25, BUN 18, creatinine 1.26, glucose 100.  CK-MB and troponin I negative x1. BNP 430.  IMPRESSION:  Mr. Zou was seen today by Dr. Daleen Squibb, the patient evaluated and the data reviewed.  ASSESSMENT:  Musculoskeletal chest pain since Wednesday x4 days after heavy lifting.  However, he has multiple cardiac risk factors that are ongoing and known coronary artery disease with a chronic left bundle- branch block.  We will admit and cycle cardiac enzymes.  His Coumadin will be held tonight.  If he rules out, he can possibly be discharged tomorrow on naproxen 500 mg b.i.d.  This situation was discussed with the patient and his family and they agree with the plan.     Theodore Demark, PA-C   ______________________________ Jesse Sans Daleen Squibb, MD, [facility]    RB/MEDQ  D:  07/09/2011  T:  07/10/2011  Job:  [id number]  Electronically Signed by Theodore Demark PA-C on 07/18/2011 06:40:30 AM Electronically Signed by Valera Castle MD FACC on 07/26/2011 10:38:02 AM

## 2011-08-08 ENCOUNTER — Ambulatory Visit (INDEPENDENT_AMBULATORY_CARE_PROVIDER_SITE_OTHER): Payer: Medicare Other | Admitting: *Deleted

## 2011-08-08 DIAGNOSIS — I4891 Unspecified atrial fibrillation: Secondary | ICD-10-CM

## 2011-08-08 DIAGNOSIS — Z7901 Long term (current) use of anticoagulants: Secondary | ICD-10-CM

## 2011-08-10 ENCOUNTER — Encounter: Payer: Medicare Other | Admitting: *Deleted

## 2011-08-29 ENCOUNTER — Other Ambulatory Visit: Payer: Self-pay | Admitting: *Deleted

## 2011-08-29 ENCOUNTER — Telehealth: Payer: Self-pay | Admitting: Cardiology

## 2011-08-29 MED ORDER — WARFARIN SODIUM 5 MG PO TABS: ORAL_TABLET | ORAL | Status: DC

## 2011-08-29 NOTE — Telephone Encounter (Signed)
Pts mail order had been delayed, requesting samples of warfarin 5mg  needs #16, if not can call into [facility]  In [place]

## 2011-09-04 ENCOUNTER — Other Ambulatory Visit: Payer: Self-pay | Admitting: *Deleted

## 2011-09-04 MED ORDER — DOXAZOSIN MESYLATE 8 MG PO TABS: 8.0000 mg | ORAL_TABLET | Freq: Every day | ORAL | Status: DC

## 2011-09-04 MED ORDER — METOPROLOL SUCCINATE ER 100 MG PO TB24: 50.0000 mg | ORAL_TABLET | Freq: Every day | ORAL | Status: DC

## 2011-09-04 MED ORDER — HYDRALAZINE HCL 25 MG PO TABS: 25.0000 mg | ORAL_TABLET | Freq: Three times a day (TID) | ORAL | Status: DC

## 2011-09-06 ENCOUNTER — Ambulatory Visit (INDEPENDENT_AMBULATORY_CARE_PROVIDER_SITE_OTHER): Payer: Medicare Other | Admitting: *Deleted

## 2011-09-06 DIAGNOSIS — I4891 Unspecified atrial fibrillation: Secondary | ICD-10-CM

## 2011-09-06 DIAGNOSIS — Z7901 Long term (current) use of anticoagulants: Secondary | ICD-10-CM

## 2011-09-10 ENCOUNTER — Emergency Department (HOSPITAL_COMMUNITY): Payer: Medicare Other

## 2011-09-10 ENCOUNTER — Inpatient Hospital Stay (HOSPITAL_COMMUNITY)
Admission: EM | Admit: 2011-09-10 | Discharge: 2011-09-15 | DRG: 342 | Disposition: A | Discharge status: Home / Self Care | Payer: Medicare Other | Source: Ambulatory Visit | Attending: General Surgery | Admitting: General Surgery

## 2011-09-10 DIAGNOSIS — Z951 Presence of aortocoronary bypass graft: Secondary | ICD-10-CM

## 2011-09-10 DIAGNOSIS — F17200 Nicotine dependence, unspecified, uncomplicated: Secondary | ICD-10-CM | POA: Diagnosis present

## 2011-09-10 DIAGNOSIS — I714 Abdominal aortic aneurysm, without rupture, unspecified: Secondary | ICD-10-CM | POA: Diagnosis present

## 2011-09-10 DIAGNOSIS — E785 Hyperlipidemia, unspecified: Secondary | ICD-10-CM | POA: Diagnosis present

## 2011-09-10 DIAGNOSIS — J9819 Other pulmonary collapse: Secondary | ICD-10-CM | POA: Diagnosis not present

## 2011-09-10 DIAGNOSIS — I2510 Atherosclerotic heart disease of native coronary artery without angina pectoris: Secondary | ICD-10-CM | POA: Diagnosis present

## 2011-09-10 DIAGNOSIS — J449 Chronic obstructive pulmonary disease, unspecified: Secondary | ICD-10-CM | POA: Diagnosis present

## 2011-09-10 DIAGNOSIS — E876 Hypokalemia: Secondary | ICD-10-CM | POA: Diagnosis present

## 2011-09-10 DIAGNOSIS — Z23 Encounter for immunization: Secondary | ICD-10-CM

## 2011-09-10 DIAGNOSIS — J4489 Other specified chronic obstructive pulmonary disease: Secondary | ICD-10-CM | POA: Diagnosis present

## 2011-09-10 DIAGNOSIS — I2589 Other forms of chronic ischemic heart disease: Secondary | ICD-10-CM | POA: Diagnosis present

## 2011-09-10 DIAGNOSIS — Z7982 Long term (current) use of aspirin: Secondary | ICD-10-CM

## 2011-09-10 DIAGNOSIS — I10 Essential (primary) hypertension: Secondary | ICD-10-CM | POA: Diagnosis present

## 2011-09-10 DIAGNOSIS — K3580 Unspecified acute appendicitis: Principal | ICD-10-CM | POA: Diagnosis present

## 2011-09-10 DIAGNOSIS — I4891 Unspecified atrial fibrillation: Secondary | ICD-10-CM | POA: Diagnosis present

## 2011-09-10 DIAGNOSIS — Z79899 Other long term (current) drug therapy: Secondary | ICD-10-CM

## 2011-09-10 DIAGNOSIS — Z7901 Long term (current) use of anticoagulants: Secondary | ICD-10-CM

## 2011-09-10 LAB — COMPREHENSIVE METABOLIC PANEL
ALT: 18 U/L (ref 0–53)
AST: 17 U/L (ref 0–37)
Alkaline Phosphatase: 91 U/L (ref 39–117)
Calcium: 8.6 mg/dL (ref 8.4–10.5)
Potassium: 3.4 mEq/L — ABNORMAL LOW (ref 3.5–5.1)
Sodium: 142 mEq/L (ref 135–145)
Total Protein: 6.3 g/dL (ref 6.0–8.3)

## 2011-09-10 LAB — POCT I-STAT, CHEM 8
BUN: 17 mg/dL (ref 6–23)
Calcium, Ion: 1.16 mmol/L (ref 1.12–1.32)
Chloride: 108 mEq/L (ref 96–112)
HCT: 41 % (ref 39.0–52.0)
Sodium: 144 mEq/L (ref 135–145)
TCO2: 24 mmol/L (ref 0–100)

## 2011-09-10 LAB — CBC
MCH: 30.5 pg (ref 26.0–34.0)
MCV: 92.1 fL (ref 78.0–100.0)
Platelets: 146 10*3/uL — ABNORMAL LOW (ref 150–400)
RBC: 4.42 MIL/uL (ref 4.22–5.81)
RDW: 14.5 % (ref 11.5–15.5)
WBC: 12.9 10*3/uL — ABNORMAL HIGH (ref 4.0–10.5)

## 2011-09-10 LAB — DIFFERENTIAL
Basophils Relative: 0 % (ref 0–1)
Eosinophils Absolute: 0.6 10*3/uL (ref 0.0–0.7)
Eosinophils Relative: 4 % (ref 0–5)
Lymphs Abs: 1.9 10*3/uL (ref 0.7–4.0)
Neutrophils Relative %: 73 % (ref 43–77)

## 2011-09-10 MED ORDER — IOHEXOL 350 MG/ML SOLN
150.0000 mL | Freq: Once | INTRAVENOUS | Status: AC | PRN
  Administered 2011-09-10: 150 mL via INTRAVENOUS

## 2011-09-11 ENCOUNTER — Other Ambulatory Visit (INDEPENDENT_AMBULATORY_CARE_PROVIDER_SITE_OTHER): Payer: Self-pay | Admitting: Surgery

## 2011-09-11 DIAGNOSIS — K3580 Unspecified acute appendicitis: Secondary | ICD-10-CM

## 2011-09-11 HISTORY — PX: APPENDECTOMY: SHX54

## 2011-09-11 LAB — URINALYSIS, ROUTINE W REFLEX MICROSCOPIC
Bilirubin Urine: NEGATIVE
Bilirubin Urine: NEGATIVE
Glucose, UA: NEGATIVE
Glucose, UA: NEGATIVE mg/dL
Hgb urine dipstick: NEGATIVE
Hgb urine dipstick: NEGATIVE
Ketones, ur: NEGATIVE
Ketones, ur: NEGATIVE mg/dL
Protein, ur: NEGATIVE
Specific Gravity, Urine: 1.029 (ref 1.005–1.030)
pH: 7 (ref 5.0–8.0)

## 2011-09-11 LAB — BASIC METABOLIC PANEL
BUN: 12
CO2: 27
Calcium: 8.8
Creatinine, Ser: 1.3
GFR calc non Af Amer: 52 — ABNORMAL LOW
Glucose, Bld: 104 — ABNORMAL HIGH
Glucose, Bld: 118 — ABNORMAL HIGH
Potassium: 3.1 — ABNORMAL LOW
Sodium: 143

## 2011-09-11 LAB — COMPREHENSIVE METABOLIC PANEL
AST: 14 U/L (ref 0–37)
Albumin: 3.4 g/dL — ABNORMAL LOW (ref 3.5–5.2)
Alkaline Phosphatase: 76 U/L (ref 39–117)
Chloride: 108 mEq/L (ref 96–112)
Potassium: 3.3 mEq/L — ABNORMAL LOW (ref 3.5–5.1)
Total Bilirubin: 0.5 mg/dL (ref 0.3–1.2)

## 2011-09-11 LAB — PROTIME-INR
INR: 1.89 — ABNORMAL HIGH (ref 0.00–1.49)
INR: 2.11 — ABNORMAL HIGH (ref 0.00–1.49)
Prothrombin Time: 24 seconds — ABNORMAL HIGH (ref 11.6–15.2)

## 2011-09-11 LAB — URINE CULTURE
Colony Count: NO GROWTH
Culture  Setup Time: 201210010102
Culture: NO GROWTH

## 2011-09-11 LAB — CBC
HCT: 36.8 % — ABNORMAL LOW (ref 39.0–52.0)
HCT: 38.6 — ABNORMAL LOW
Hemoglobin: 12.3 g/dL — ABNORMAL LOW (ref 13.0–17.0)
MCHC: 33.4 g/dL (ref 30.0–36.0)
MCHC: 33.2
MCV: 93
Platelets: 135 — ABNORMAL LOW
RDW: 14.6
WBC: 9.5 10*3/uL (ref 4.0–10.5)

## 2011-09-11 LAB — DIFFERENTIAL
Basophils Absolute: 0 10*3/uL (ref 0.0–0.1)
Lymphocytes Relative: 13 % (ref 12–46)
Lymphs Abs: 1.3 10*3/uL (ref 0.7–4.0)
Monocytes Absolute: 0.8 10*3/uL (ref 0.1–1.0)
Neutro Abs: 7.2 10*3/uL (ref 1.7–7.7)

## 2011-09-11 LAB — POCT I-STAT, CHEM 8
BUN: 15
Calcium, Ion: 1.17
Creatinine, Ser: 1.4
TCO2: 27

## 2011-09-11 LAB — LIPID PANEL: Triglycerides: 163 — ABNORMAL HIGH

## 2011-09-11 LAB — ABO/RH: ABO/RH(D): O POS

## 2011-09-11 LAB — URINE MICROSCOPIC-ADD ON

## 2011-09-11 LAB — APTT: aPTT: 43 seconds — ABNORMAL HIGH (ref 24–37)

## 2011-09-11 LAB — TYPE AND SCREEN

## 2011-09-12 LAB — CBC
MCH: 30 pg (ref 26.0–34.0)
MCV: 94.9 fL (ref 78.0–100.0)
Platelets: 135 10*3/uL — ABNORMAL LOW (ref 150–400)
RDW: 15.3 % (ref 11.5–15.5)
WBC: 8.2 10*3/uL (ref 4.0–10.5)

## 2011-09-12 LAB — BASIC METABOLIC PANEL
BUN: 14 mg/dL (ref 6–23)
Calcium: 8.4 mg/dL (ref 8.4–10.5)
GFR calc Af Amer: 73 mL/min — ABNORMAL LOW (ref >90)
GFR calc non Af Amer: 63 mL/min — ABNORMAL LOW (ref >90)
Glucose, Bld: 128 mg/dL — ABNORMAL HIGH (ref 70–99)

## 2011-09-12 LAB — PREPARE FRESH FROZEN PLASMA: Unit division: 0

## 2011-09-12 LAB — PROTIME-INR: Prothrombin Time: 22.3 seconds — ABNORMAL HIGH (ref 11.6–15.2)

## 2011-09-13 ENCOUNTER — Inpatient Hospital Stay (HOSPITAL_COMMUNITY): Payer: Medicare Other

## 2011-09-14 DIAGNOSIS — R0902 Hypoxemia: Secondary | ICD-10-CM

## 2011-09-14 DIAGNOSIS — J449 Chronic obstructive pulmonary disease, unspecified: Secondary | ICD-10-CM

## 2011-09-15 LAB — BASIC METABOLIC PANEL
CO2: 31 mEq/L (ref 19–32)
Chloride: 108 mEq/L (ref 96–112)
Potassium: 3.6 mEq/L (ref 3.5–5.1)
Sodium: 145 mEq/L (ref 135–145)

## 2011-09-15 LAB — PROTIME-INR: INR: 2.12 — ABNORMAL HIGH (ref 0.00–1.49)

## 2011-09-19 NOTE — H&P (Signed)
NAMEOBRIAN, [name] [name]             ACCOUNT NO.:  [id number]  MEDICAL RECORD NO.:  [id number]  LOCATION:  [facility]                         FACILITY:  [facility]  PHYSICIAN:  Maisie Fus A. Demarco Bacci, M.D.DATE OF BIRTH:  1941/07/16  DATE OF ADMISSION:  09/10/2011 DATE OF DISCHARGE:                             HISTORY & PHYSICAL   CHIEF COMPLAINT:  Abdominal pain.  HISTORY OF PRESENT ILLNESS:  The patient is a 70 year old male with about a 5-hour history of right lower quadrant pain.  The pain came on suddenly.  Sharp in nature.  8/10 in intensity.  No radiation.  It was associated with nausea and vomiting.  No fever or chills.  He has multiple medical problems including AFib, coronary artery disease, hypertension, hyperlipidemia, ejection fraction of 44%, a 4-cm abdominal aortic aneurysm, COPD, tobacco abuse.  PAST MEDICAL HISTORY:  Please see above.  PAST SURGICAL HISTORY:  Negative for abdominal surgery.  FAMILY HISTORY:  Noncontributory.  SOCIAL HISTORY:  Former smoker, does not drink regularly.  REVIEW OF SYSTEMS:  Positive for right lower quadrant pain.  Positive for shortness of breath.  Negative for chest pain.  MEDICATIONS: 1. Nitroglycerin 0.4 mg sublingual. 2. Amiodarone 200 mg daily. 3. Amlodipine 10 mg daily. 4. Aspirin 81 mg daily. 5. Doxazosin 8 mg daily. 6. Lasix 40 mg a day. 7. Hydralazine 25 mg t.i.d. 8. Lisinopril 40 mg a day. 9. Metoprolol 50 mg daily. 10.Niacin 500 mg b.i.d. 11.Coumadin 7.5 on Monday, Wednesday, and Friday and 5 mg Tuesday,     Wednesday, Thursday, Saturday, Sunday.  ALLERGIES TO MEDICINES:  None.  PHYSICAL EXAMINATION:  VITAL SIGNS:  Temperature 98, pulse 70, and blood pressure 130/49. GENERAL APPEARANCE:  Male, mildly short of breath. HEENT:  No jaundice.  Oropharynx moist. NECK:  Supple, nontender.  Trachea midline. PULMONARY:  Lung sounds showed wheezes bilaterally.  Air movement is good.  Work of breathing minimal. CARDIOVASCULAR:  AFib  noted, rate is 70.  No murmur. ABDOMEN:  Tender right lower quadrant with rebound.  No mass.  Small umbilical hernia, reducible. EXTREMITIES:  Muscle tone and range of motion grossly normal.  Mild 1+ pedal edema noted. NEURO:  Glasgow coma scale is 15.  Motor and sensory function grossly intact.  DIAGNOSTIC STUDIES:  White count 12,900, hemoglobin 13.5, and platelet count 146,000.  His INR is 2.1.  PTT is 49.  Sodium 142, potassium 3.4, chloride 107, CO2 of 26, BUN 18, creatinine 1.1, glucose 106.  CT scan shows acute appendicitis, 4 cm abdominal aortic aneurysm, so enlarged from previous study of 3.8 six months ago.  IMPRESSION: 1. Acute appendicitis. 2. Coronary artery disease. 3. Atrial fibrillation, on Coumadin. 4. Chronic obstructive pulmonary disease.  PLAN:  Discussed the case with Cardiology, Dr. [name], of [place]. He said it was okay to correct his Coumadin, even though he is only 2.1. I think the best thing at this time will be a laparoscopic appendectomy. He had multiple medical problems and certainly could be managed medically.  If there is a contraindication after discussing the case with Cardiology, I felt he would be safe to precede at this point in time given the fact that he has an ejection fraction of 44%  and also has a history being revascularized.  There is risk of bleeding, infection, organ injury, bowel injury, abscess formation, myocardial infarction, stroke, pulmonary embolus, pulmonary failure, but at this point in time it is felt that he is fit to undergo an operation.  I have discussed this with the patient's family and they agreed to proceed.  Alternative therapy medical management, but there is a risk of that failing requiring surgery.  The risk from that standpoint would be more complications and also he may be more ill and have more risk for cardiovascular issues.  They would like to proceed.  Also discussed an open procedure.     Marchelle Rinella A.  Rahshawn Remo, M.D.     TAC/MEDQ  D:  09/11/2011  T:  09/11/2011  Job:  [id number]  Electronically Signed by Harriette Bouillon M.D. on 09/19/2011 07:54:48 AM

## 2011-09-19 NOTE — Op Note (Signed)
[name] [name], [name] [name]             ACCOUNT NO.:  [id number]  MEDICAL RECORD NO.:  [id number]  LOCATION:                                 FACILITY:  PHYSICIAN:  Maisie Fus A. Skanda Worlds, M.D.DATE OF BIRTH:  1941-09-20  DATE OF PROCEDURE:  09/11/2011 DATE OF DISCHARGE:                              OPERATIVE REPORT   PREOPERATIVE DIAGNOSIS:  Appendicitis.  POSTOPERATIVE DIAGNOSIS:  Appendicitis.  PROCEDURE:  Laparoscopic appendectomy.  SURGEON:  Maisie Fus A. Blanca Carreon, MD  ANESTHESIA:  General endotracheal anesthesia with 0.25% Sensorcaine local.  ESTIMATED BLOOD LOSS:  50 mL.  SPECIMEN:  Appendix to pathology.  DRAINS:  None.  INDICATIONS FOR PROCEDURE:  The patient is a 70 year old male with acute appendicitis and multiple medical problems.  He received cardiac consultation preop and they thought it was safe to proceed.  Risks, benefits, and alternative therapies to appendicitis were discussed including antibiotics and medical management.  After discussion of the above as well as the risks of surgery to include bleeding, infection, myocardial infarction, death, DVT, bowel injury, abscess formation, need for open surgery, worsening of preexisting medical conditions, pulmonary failure were discussed, also hernia formation.  He agreed to proceed.  DESCRIPTION OF PROCEDURE:  The patient was brought to the operating room and placed supine.  After induction of general endotracheal anesthesia, the abdomen was prepped and draped in sterile fashion and Foley catheter was placed under sterile conditions.  Once after time-out was done, a 1- cm supraumbilical incision was made.  Dissection was carried down to the fascia and the fascia was opened.  He had umbilical hernia, I was able to gain access through that as well.  I entered the abdominal cavity and placed a pursestring suture of 0 Vicryl around the fascial defect within the umbilical hernia site which measured about 1 cm.  I then placed  a 12- mm Hasson cannula through this.  Pneumoperitoneum was created to 15 mmHg of CO2 and a laparoscope was placed.  I placed two 5-mm ports, one in the right upper quadrant, the second in the left upper quadrant.  This was done under direct vision.  The appendix was visualized.  It was scarred and was retrocecal.  I was able to mobilize the appendix from the retroperitoneum.  I created a window at the junction of the cecum and appendix and then placed a GIA 45 stapling device in position and divided the appendix at the base of the cecum.  I then was able to grab the appendix, used a harmonic scalpel and excised it carefully with good hemostasis.  The appendix was then placed in EndoCatch bag.  Excess irrigation was suctioned out.  There was a little bit of oozing at the staple line and I placed Surgicel there with adequate hemostasis.  He was on Coumadin but his INR was 2.1, therefore, he received 2 units of FFP during the case.  Four quadrant laparoscopy was done.  I saw no injury to the colon or large bowel or small bowel or sigmoid colon.  Theremainder of his intra-abdominal findings were benign.  At this point time, I removed ports allowing the CO2 to escape.  I closed the umbilical fascial defect with #1  Novafil suture interrupted.  Skin was closed with 4-0 Monocryl.  Dermabond was applied.  All final counts of sponges, needle, and instruments were found to be correct at this portion of the case.  The patient was awoken, extubated, and taken to recovery in satisfactory condition.  All final counts were found to be correct.     Shuntia Exton A. Chayson Charters, M.D.     TAC/MEDQ  D:  09/11/2011  T:  09/11/2011  Job:  [id number]  cc:   Madolyn Frieze. [name], MD, [facility]  Electronically Signed by Harriette Bouillon M.D. on 09/19/2011 07:54:50 AM

## 2011-10-04 ENCOUNTER — Ambulatory Visit (INDEPENDENT_AMBULATORY_CARE_PROVIDER_SITE_OTHER): Payer: Medicare Other | Admitting: *Deleted

## 2011-10-04 DIAGNOSIS — Z7901 Long term (current) use of anticoagulants: Secondary | ICD-10-CM

## 2011-10-04 DIAGNOSIS — I4891 Unspecified atrial fibrillation: Secondary | ICD-10-CM

## 2011-10-04 LAB — POCT INR: INR: 2.2

## 2011-10-08 NOTE — Discharge Summary (Signed)
  [name] [name], [name] [name]             ACCOUNT NO.:  [id number]  MEDICAL RECORD NO.:  [id number]  LOCATION:  5153                         FACILITY:  [facility]  PHYSICIAN:  Gabrielle Dare. Janee Morn, M.D.DATE OF BIRTH:  [birth year]  DATE OF ADMISSION:  09/10/2011 DATE OF DISCHARGE:  09/15/2011                              DISCHARGE SUMMARY   HISTORY OF PRESENT ILLNESS:  [name] is a 70 year old gentleman with a history of hypertension, AFib, coronary artery disease, COPD that is essentially untreated, and chronic tobacco abuse.  He presented with a complaint of abdominal pain and was worked up in the emergency department and found to have evidence of acute appendicitis.  Decision was made to admit the patient and take the patient to the operating room for surgical evaluation.  SUMMARY OF HOSPITAL COURSE:  The patient was admitted, was taken to the operating room by Dr. Luisa Hart on September 11, 2011, undergoing laparoscopic appendectomy without complication.  His Coumadin was restarted and titrated to a therapeutic level.  The patient did have some trouble maintaining his oxygen saturation, had to be placed on oxygen.  We actually asked the Pulmonary team to evaluate him for uncontrolled/untreated COPD.  They evaluated the patient and actually helped [place] set up followup appointment for him to be evaluated for COPD as an outpatient but determined him to be stable from a discharge standpoint without the need for home oxygenation.  He has, otherwise, done well from his appendectomy, tolerating regular diet, having good bowel function, voiding well without difficulty.  I feel he is appropriately stable for discharge home at present time.  DISCHARGE DIAGNOSES: 1. Acute appendicitis status post laparoscopic appendectomy. 2. Atrial fibrillation - stable. 3. Coronary artery disease - stable. 4. Presumed chronic obstructive pulmonary disease - plans for followup     noted.  DISCHARGE MEDICATIONS:  The  patient was given prescription for: 1. OxyIR 5 mg 1-2 tablets q.4 h. p.r.n. pain. 2. Amiodarone 200 mg daily. 3. Amlodipine 10 mg daily. 4. Aspirin 81 mg daily. 5. Doxazosin 8 mg daily. 6. Lasix 40 mg daily. 7. Hydralazine 25 mg three times daily. 8. Lisinopril 40 mg daily. 9. Metoprolol 50 mg daily. 10.Multivitamins daily. 11.Niacin 500 mg two tablets twice daily. 12.NitroTab 0.4 mg as needed. 13.Potassium supplement 20 mEq 2 tablets three times daily. 14.Pravachol 80 mg daily. 15.Coumadin 5 mg 1-1/2 tablets Monday and Friday and 1 tablet every     other day.  DISCHARGE PLAN:  The patient will follow up in our [facility] in approximately 2 weeks for followup appendectomy.  He is scheduled with Pulmonary team on October 12, 2011, for outpatient assessment of COPD. He needs to follow up with his primary care physician and/or cardiologist regarding Coumadin management.  His outgoing INR is 2.1 on his regular scheduled doses.     Brayton El, PA-C   ______________________________ Gabrielle Dare. Janee Morn, M.D.    KB/MEDQ  D:  09/15/2011  T:  09/15/2011  Job:  [id number]  Electronically Signed by Brayton El  on 09/27/2011 03:49:37 PM Electronically Signed by Violeta Gelinas M.D. on 10/08/2011 08:49:44 AM

## 2011-10-09 ENCOUNTER — Other Ambulatory Visit: Payer: Self-pay | Admitting: *Deleted

## 2011-10-09 DIAGNOSIS — I714 Abdominal aortic aneurysm, without rupture: Secondary | ICD-10-CM

## 2011-10-10 ENCOUNTER — Encounter (INDEPENDENT_AMBULATORY_CARE_PROVIDER_SITE_OTHER): Payer: Self-pay | Admitting: General Surgery

## 2011-10-10 ENCOUNTER — Ambulatory Visit (INDEPENDENT_AMBULATORY_CARE_PROVIDER_SITE_OTHER): Payer: Medicare Other | Admitting: General Surgery

## 2011-10-10 DIAGNOSIS — Z9889 Other specified postprocedural states: Secondary | ICD-10-CM

## 2011-10-10 DIAGNOSIS — K3580 Unspecified acute appendicitis: Secondary | ICD-10-CM

## 2011-10-10 NOTE — Progress Notes (Signed)
[name] [name] 1941/07/05 [id number] 10/10/2011   History of Present Illness: [name] [name] is a  70 y.o. male who presents today status post lap appy.  Pathology reveals acute appendicitis with obliteration of appendiceal tip.  The patient is tolerating a regular diet, having normal bowel movements, has good pain control.  He is back to most normal activities.   Physical Exam: Abd: soft, nontender, active bowel sounds, nondistended.  All incisions are well healed.  Impression: 1.  Acute appendicitis, s/p lap appy  Plan: He is able to return to normal activities. He  may follow up on a prn basis.

## 2011-10-10 NOTE — Patient Instructions (Addendum)
Follow up as needed

## 2011-10-12 ENCOUNTER — Ambulatory Visit (INDEPENDENT_AMBULATORY_CARE_PROVIDER_SITE_OTHER): Payer: Medicare Other | Admitting: Emergency Medicine

## 2011-10-12 ENCOUNTER — Encounter: Payer: Self-pay | Admitting: Emergency Medicine

## 2011-10-12 VITALS — BP 124/70 | HR 47 | Temp 97.6°F | Ht 73.0 in | Wt 273.0 lb

## 2011-10-12 DIAGNOSIS — J449 Chronic obstructive pulmonary disease, unspecified: Secondary | ICD-10-CM

## 2011-10-12 NOTE — Progress Notes (Signed)
Subjective:    Patient ID: [name] [name], male    DOB: 23-Aug-1941, 70 y.o.   MRN: [id number]  HPI 70 yo man, former tobacco (80+ pk-yrs), hx of CAD, HTN, A Fib, carotid dz. Was hospitalized 9-09/2011 for appendectomy, and during that hosp was found to have hypoxemia. Pulm consulted and he was suspected to have COPD + restriction due to abd compliance. He is not quite back up to usual functional capacity yet. He describes exertional SOB after walking 200 ft. No real cough. On coumadin for A Fib. He has had PFT at [facility] several yrs ago. Not on BD's at this time.    Review of Systems  Constitutional: Negative.  Negative for fever and unexpected weight change.  HENT: Positive for congestion and sneezing. Negative for ear pain, nosebleeds, sore throat, rhinorrhea, trouble swallowing, dental problem, postnasal drip and sinus pressure.   Eyes: Negative.  Negative for redness and itching.  Respiratory: Positive for shortness of breath. Negative for cough, chest tightness and wheezing.   Cardiovascular: Positive for leg swelling. Negative for palpitations.  Gastrointestinal: Negative.  Negative for nausea and vomiting.  Genitourinary: Negative.  Negative for dysuria.  Musculoskeletal: Negative.  Negative for joint swelling.  Skin: Negative.  Negative for rash.  Neurological: Negative.  Negative for headaches.  Hematological: Negative.  Does not bruise/bleed easily.  Psychiatric/Behavioral: Negative.  Negative for dysphoric mood. The patient is not nervous/anxious.     Past Medical History  Diagnosis Date  . Atrial fibrillation     s/p prior DCCV;  Amiodarone Rx.  . CAD (coronary artery disease)     s/p CABG;  cath 10/07:  LM ok, LAD occluded, AV CFX 95%, pRCA occluded; L-LAD, S-OM2/OM3, S-PDA ok  . Cerebrovascular disease     s/p prior Left CEA;  followed by Dr. [name]  . COPD (chronic obstructive pulmonary disease)   . Hyperlipidemia   . Hypertension   . Mitral insufficiency     hx of    . Nephrolithiasis   . Ischemic cardiomyopathy     echo 5/09: EF 55%, mild LAE;  Myoview 11/10: inf and apical scar with mild peri-infarct ischemia, EF 44%  . Iliac artery aneurysm, left     followed by Dr. [name]  . Aortic dissection     H/O focal aortic dissection  . LBBB (left bundle branch block)   . CHF (congestive heart failure)      Family History  Problem Relation Age of Onset  . Stroke Maternal Uncle      History   Social History  . Marital Status: Married    Spouse Name: N/A    Number of Children: N/A  . Years of Education: N/A   Occupational History  . RETIRED---truck driver    Social History Main Topics  . Smoking status: Former Smoker -- 1.5 packs/day for 57 years    Types: Cigarettes    Quit date: 12/11/2004  . Smokeless tobacco: Never Used  . Alcohol Use: No  . Drug Use: No  . Sexually Active: Not on file   Other Topics Concern  . Not on file   Social History Narrative  . No narrative on file     No Known Allergies   Outpatient Prescriptions Prior to Visit  Medication Sig Dispense Refill  . amiodarone (PACERONE) 200 MG tablet Take 200 mg by mouth daily.        [name] amLODipine (NORVASC) 10 MG tablet Take 10 mg by mouth daily.        [name]  aspirin 81 MG tablet Take 81 mg by mouth daily.        [name] doxazosin (CARDURA) 8 MG tablet Take 1 tablet (8 mg total) by mouth at bedtime.  90 tablet  4  . furosemide (LASIX) 40 MG tablet Take 40 mg by mouth daily.        . hydrALAZINE (APRESOLINE) 25 MG tablet Take 1 tablet (25 mg total) by mouth 3 (three) times daily.  270 tablet  4  . lisinopril (PRINIVIL,ZESTRIL) 40 MG tablet Take 40 mg by mouth daily.        . metoprolol (TOPROL-XL) 100 MG 24 hr tablet Take 0.5 tablets (50 mg total) by mouth daily.  90 tablet  4  . Multiple Vitamin (MULTIVITAMIN) tablet Take 1 tablet by mouth daily.        . niacin (NIASPAN) 500 MG CR tablet Take 1,000 mg by mouth 2 (two) times daily.        . nitroGLYCERIN (NITROSTAT) 0.4 MG SL  tablet Place 0.4 mg under the tongue every 5 (five) minutes as needed.        . potassium chloride SA (KLOR-CON M20) 20 MEQ tablet Take 40 mEq by mouth 3 (three) times daily.        . pravastatin (PRAVACHOL) 80 MG tablet Take 80 mg by mouth daily.        [name] warfarin (COUMADIN) 5 MG tablet Take As Directed per Anticoagulation Clinic  16 tablet  3        Objective:   Physical Exam  Gen: Pleasant, obese, in no distress,  normal affect  ENT: No lesions,  mouth clear,  oropharynx clear, no postnasal drip  Neck: No JVD, no TMG, no carotid bruits  Lungs: No use of accessory muscles, no dullness to percussion, mild wheeze on forced exp  Cardiovascular: RRR, heart sounds normal, no murmur or gallops, no peripheral edema  Musculoskeletal: No deformities, no cyanosis or clubbing  Neuro: alert, non focal  Skin: Warm, no lesions or rashes     Assessment & Plan:  COPD (chronic obstructive pulmonary disease) - will perform full PFT to assess fxn - probably start BD's next visit to see if he benefits - rov next avail

## 2011-10-12 NOTE — Patient Instructions (Signed)
We will perform full pulmonary function testing at your next office visit Depending on the results we will decide about starting inhaled medications.  Follow with Dr Delton Coombes next available appointment

## 2011-10-12 NOTE — Assessment & Plan Note (Signed)
-   will perform full PFT to assess fxn - probably start BD's next visit to see if he benefits - rov next avail

## 2011-11-08 ENCOUNTER — Ambulatory Visit (INDEPENDENT_AMBULATORY_CARE_PROVIDER_SITE_OTHER): Payer: Medicare Other | Admitting: *Deleted

## 2011-11-08 ENCOUNTER — Ambulatory Visit (INDEPENDENT_AMBULATORY_CARE_PROVIDER_SITE_OTHER): Payer: Medicare Other | Admitting: Cardiology

## 2011-11-08 ENCOUNTER — Encounter: Payer: Self-pay | Admitting: Cardiology

## 2011-11-08 DIAGNOSIS — I4891 Unspecified atrial fibrillation: Secondary | ICD-10-CM

## 2011-11-08 DIAGNOSIS — I6529 Occlusion and stenosis of unspecified carotid artery: Secondary | ICD-10-CM

## 2011-11-08 DIAGNOSIS — Z7901 Long term (current) use of anticoagulants: Secondary | ICD-10-CM

## 2011-11-08 DIAGNOSIS — E7800 Pure hypercholesterolemia, unspecified: Secondary | ICD-10-CM

## 2011-11-08 DIAGNOSIS — I10 Essential (primary) hypertension: Secondary | ICD-10-CM

## 2011-11-08 DIAGNOSIS — I723 Aneurysm of iliac artery: Secondary | ICD-10-CM

## 2011-11-08 DIAGNOSIS — I7103 Dissection of thoracoabdominal aorta: Secondary | ICD-10-CM

## 2011-11-08 DIAGNOSIS — I2589 Other forms of chronic ischemic heart disease: Secondary | ICD-10-CM

## 2011-11-08 DIAGNOSIS — I2510 Atherosclerotic heart disease of native coronary artery without angina pectoris: Secondary | ICD-10-CM

## 2011-11-08 LAB — POCT INR: INR: 2.4

## 2011-11-08 NOTE — Assessment & Plan Note (Signed)
Blood pressure controlled. Continue present medications. 

## 2011-11-08 NOTE — Assessment & Plan Note (Signed)
Followed by vascular surgery. 

## 2011-11-08 NOTE — Assessment & Plan Note (Signed)
Continue statin. Discontinue aspirin as he is on Coumadin.

## 2011-11-08 NOTE — Assessment & Plan Note (Signed)
Patient remains in sinus rhythm on examination.plan continue amiodarone and Coumadin. I will recheck chest x-ray, liver functions and TSH when he returns in 6 months.

## 2011-11-08 NOTE — Assessment & Plan Note (Signed)
Continue beta blocker, ACE inhibitor and statin.

## 2011-11-08 NOTE — Assessment & Plan Note (Signed)
Continue ACE inhibitor and beta blocker. 

## 2011-11-08 NOTE — Patient Instructions (Signed)
Your physician wants you to follow-up in: 6 MONTHS You will receive a reminder letter in the mail two months in advance. If you don't receive a letter, please call our office to schedule the follow-up appointment.   STOP ASPIRIN

## 2011-11-08 NOTE — Progress Notes (Signed)
[name] [name] is a 70 y.o. male who presents for post hospital follow up. He has a history of coronary artery disease, status post coronary artery bypassing graft, history of ischemic cardiomyopathy, improved by most recent echocardiogram as well as COPD. He also has a history of focal aortic dissection being managed medically. His last echocardiogram was performed in May 2009 showed normal LV function. His last catheterization in October 2007 showed patent grafts. Note, he also has a left iliac artery aneurysm and mild cerebrovascular disease with history of carotid endarterectomy. He is followed by vascular surgery (Dr. [name]) for these issues. Most recent Myoview was performed in November of 2010 and showed an ejection fraction of 44%, prior inferior and apical infarct with mild peri-infarct ischemia. It was felt to be low risk and he was therefore treated medically.  Since he was last seen, the patient has dyspnea with more extreme activities but not with routine activities. It is relieved with rest. It is not associated with chest pain. There is no orthopnea, PND or pedal edema. There is no syncope or palpitations. There is no exertional chest pain.    Current Outpatient Prescriptions  Medication Sig Dispense Refill  . amiodarone (PACERONE) 200 MG tablet Take 200 mg by mouth daily.        [name] amLODipine (NORVASC) 10 MG tablet Take 10 mg by mouth daily.        [name] aspirin 81 MG tablet Take 81 mg by mouth daily.        [name] doxazosin (CARDURA) 8 MG tablet Take 1 tablet (8 mg total) by mouth at bedtime.  90 tablet  4  . furosemide (LASIX) 40 MG tablet Take 40 mg by mouth daily.        . hydrALAZINE (APRESOLINE) 25 MG tablet Take 1 tablet (25 mg total) by mouth 3 (three) times daily.  270 tablet  4  . lisinopril (PRINIVIL,ZESTRIL) 40 MG tablet Take 40 mg by mouth daily.        . metoprolol (TOPROL-XL) 100 MG 24 hr tablet Take 0.5 tablets (50 mg total) by mouth daily.  90 tablet  4  . Multiple  Vitamin (MULTIVITAMIN) tablet Take 1 tablet by mouth daily.        . niacin (NIASPAN) 500 MG CR tablet 2 tabs po qhs      . nitroGLYCERIN (NITROSTAT) 0.4 MG SL tablet Place 0.4 mg under the tongue every 5 (five) minutes as needed.        . potassium chloride SA (KLOR-CON M20) 20 MEQ tablet Take 40 mEq by mouth 3 (three) times daily.        . pravastatin (PRAVACHOL) 80 MG tablet Take 80 mg by mouth daily.        [name] warfarin (COUMADIN) 5 MG tablet Take As Directed per Anticoagulation Clinic  16 tablet  3     Past Medical History  Diagnosis Date  . Atrial fibrillation     s/p prior DCCV;  Amiodarone Rx.  . CAD (coronary artery disease)     s/p CABG;  cath 10/07:  LM ok, LAD occluded, AV CFX 95%, pRCA occluded; L-LAD, S-OM2/OM3, S-PDA ok  . Cerebrovascular disease     s/p prior Left CEA;  followed by Dr. [name]  . COPD (chronic obstructive pulmonary disease)   . Hyperlipidemia   . Hypertension   . Mitral insufficiency     hx of  . Nephrolithiasis   . Ischemic cardiomyopathy     echo 5/09:  EF 55%, mild LAE;  Myoview 11/10: inf and apical scar with mild peri-infarct ischemia, EF 44%  . Iliac artery aneurysm, left     followed by Dr. [name]  . Aortic dissection     H/O focal aortic dissection  . LBBB (left bundle branch block)   . CHF (congestive heart failure)     Past Surgical History  Procedure Date  . Uteroscopy   . Coronary artery bypass graft 1998  . Left cea 07/05  . Appendectomy 09/11/11    History   Social History  . Marital Status: Married    Spouse Name: N/A    Number of Children: N/A  . Years of Education: N/A   Occupational History  . RETIRED---truck driver    Social History Main Topics  . Smoking status: Former Smoker -- 1.5 packs/day for 57 years    Types: Cigarettes    Quit date: 12/11/2004  . Smokeless tobacco: Never Used  . Alcohol Use: No  . Drug Use: No  . Sexually Active: Not on file   Other Topics Concern  . Not on file   Social History  Narrative  . No narrative on file    ROS: no fevers or chills, productive cough, hemoptysis, dysphasia, odynophagia, melena, hematochezia, dysuria, hematuria, rash, seizure activity, orthopnea, PND, pedal edema, claudication. Remaining systems are negative.  Physical Exam: Well-developed well-nourished in no acute distress.  Skin is warm and dry.  HEENT is normal.  Neck is supple. No thyromegaly.  Chest is clear to auscultation with normal expansion.  Cardiovascular exam is regular rate and rhythm. Distant heart sounds Abdominal exam nontender or distended. No masses palpated. Extremities show no edema. neuro grossly intact

## 2011-11-08 NOTE — Assessment & Plan Note (Signed)
Continue statin. 

## 2011-11-08 NOTE — Progress Notes (Signed)
Addended by: Haynes Hoehn E on: 11/08/2011 10:17 AM   Modules accepted: Level of Service

## 2011-11-09 ENCOUNTER — Encounter: Payer: Self-pay | Admitting: Vascular Surgery

## 2011-11-14 ENCOUNTER — Ambulatory Visit (INDEPENDENT_AMBULATORY_CARE_PROVIDER_SITE_OTHER): Payer: Medicare Other | Admitting: Emergency Medicine

## 2011-11-14 ENCOUNTER — Encounter: Payer: Self-pay | Admitting: Emergency Medicine

## 2011-11-14 DIAGNOSIS — J449 Chronic obstructive pulmonary disease, unspecified: Secondary | ICD-10-CM

## 2011-11-14 LAB — PULMONARY FUNCTION TEST

## 2011-11-14 NOTE — Progress Notes (Signed)
  Subjective:    Patient ID: [name] [name], male    DOB: 03-Jul-1941, 70 y.o.   MRN: [id number] HPI 70 yo man, former tobacco (80+ pk-yrs), hx of CAD, HTN, A Fib, carotid dz. Was hospitalized 9-09/2011 for appendectomy, and during that hosp was found to have hypoxemia. Pulm consulted and he was suspected to have COPD + restriction due to abd compliance. He is not quite back up to usual functional capacity yet. He describes exertional SOB after walking 200 ft. No real cough. On coumadin for A Fib. He has had PFT at [facility] several yrs ago. Not on BD's at this time.   ROV 11/14/11 -- Hx Tobacco use, dyspnea, returns to eval SOB following his PFT today. Shows mild AFL, no BD response, normal volumes. He has been dieting, has lost about 20 lbs since hospitalization. His breathing is better - still with some limitations but able to do more.    Review of Systems  Constitutional: Negative.  Negative for fever and unexpected weight change.  HENT: Positive for congestion and sneezing. Negative for ear pain, nosebleeds, sore throat, rhinorrhea, trouble swallowing, dental problem, postnasal drip and sinus pressure.   Eyes: Negative.  Negative for redness and itching.  Respiratory: Positive for shortness of breath. Negative for cough, chest tightness and wheezing.   Cardiovascular: Positive for leg swelling. Negative for palpitations.  Gastrointestinal: Negative.  Negative for nausea and vomiting.  Genitourinary: Negative.  Negative for dysuria.  Musculoskeletal: Negative.  Negative for joint swelling.  Skin: Negative.  Negative for rash.  Neurological: Negative.  Negative for headaches.  Hematological: Negative.  Does not bruise/bleed easily.  Psychiatric/Behavioral: Negative.  Negative for dysphoric mood. The patient is not nervous/anxious.        Objective:   Physical Exam  Gen: Pleasant, obese, in no distress,  normal affect  ENT: No lesions,  mouth clear,  oropharynx clear, no postnasal  drip  Neck: No JVD, no TMG, no carotid bruits  Lungs: No use of accessory muscles, no dullness to percussion, mild wheeze on forced exp  Cardiovascular: RRR, heart sounds normal, no murmur or gallops, no peripheral edema  Musculoskeletal: No deformities, no cyanosis or clubbing  Neuro: alert, non focal  Skin: Warm, no lesions or rashes     Assessment & Plan:  COPD (chronic obstructive pulmonary disease) PFT with mild AFL - we will defer scheduled BDs for now - prn SABA - reviewed sx of an AE today, instructed him to call if he notices signs of same

## 2011-11-14 NOTE — Patient Instructions (Signed)
Your breathing tests show evidence for mild COPD We will not start scheduled medications at this time You should have a rescue inhaler available to use 2 puffs if needed for shortness of breath Call our office if you experience worsening shortness of breath, wheezing, mucous production.  Follow with Dr Delton Coombes in 1 year or sooner if you have any problems.

## 2011-11-14 NOTE — Progress Notes (Signed)
PFT done today. 

## 2011-11-14 NOTE — Assessment & Plan Note (Signed)
PFT with mild AFL - we will defer scheduled BDs for now - prn SABA - reviewed sx of an AE today, instructed him to call if he notices signs of same

## 2011-11-27 ENCOUNTER — Other Ambulatory Visit: Payer: Self-pay | Admitting: Vascular Surgery

## 2011-11-27 LAB — CREATININE, SERUM: Creat: 1.36 mg/dL — ABNORMAL HIGH (ref 0.50–1.35)

## 2011-11-29 ENCOUNTER — Encounter: Payer: Self-pay | Admitting: Vascular Surgery

## 2011-11-30 ENCOUNTER — Ambulatory Visit
Admission: RE | Admit: 2011-11-30 | Discharge: 2011-11-30 | Disposition: A | Discharge status: Home / Self Care | Payer: Medicare Other | Source: Ambulatory Visit | Attending: Vascular Surgery | Admitting: Vascular Surgery

## 2011-11-30 ENCOUNTER — Encounter: Payer: Self-pay | Admitting: Vascular Surgery

## 2011-11-30 ENCOUNTER — Ambulatory Visit (INDEPENDENT_AMBULATORY_CARE_PROVIDER_SITE_OTHER): Payer: Medicare Other | Admitting: Vascular Surgery

## 2011-11-30 VITALS — BP 132/74 | HR 46 | Resp 16 | Ht 73.0 in | Wt 251.0 lb

## 2011-11-30 DIAGNOSIS — I6529 Occlusion and stenosis of unspecified carotid artery: Secondary | ICD-10-CM

## 2011-11-30 DIAGNOSIS — I714 Abdominal aortic aneurysm, without rupture: Secondary | ICD-10-CM

## 2011-11-30 MED ORDER — IOHEXOL 350 MG/ML SOLN
100.0000 mL | Freq: Once | INTRAVENOUS | Status: AC | PRN
  Administered 2011-11-30: 100 mL via INTRAVENOUS

## 2011-11-30 NOTE — Progress Notes (Signed)
Addended by: Sharee Pimple on: 11/30/2011 03:14 PM   Modules accepted: Orders

## 2011-11-30 NOTE — Progress Notes (Signed)
Addended by: Sharee Pimple on: 11/30/2011 03:18 PM   Modules accepted: Orders

## 2011-11-30 NOTE — Progress Notes (Signed)
[facility] [place] HISTORY AND PHYSICAL   History of Present Illness:  Patient is a 70 y.o. year old male who presents for follow-up evaluation of AAA.  The patient denies new abdominal or back pain.  He did have appendicitis in October and required appendectomy. The patient's atherosclerotic risk factors remain coronary artery disease, atrial fibrillation, peripheral arterial disease, COPD, hyperlipidemia, hypertension.  These are all currently stable and followed by his primary care physician.  The patients AAA was 4cm on intial evaluation in 2009. He returns today for followup. He was last seen in October of 2010  Past Medical History  Diagnosis Date  . Atrial fibrillation     s/p prior DCCV;  Amiodarone Rx.  . CAD (coronary artery disease)     s/p CABG;  cath 10/07:  LM ok, LAD occluded, AV CFX 95%, pRCA occluded; L-LAD, S-OM2/OM3, S-PDA ok  . Cerebrovascular disease     s/p prior Left CEA;  followed by Dr. [name]  . COPD (chronic obstructive pulmonary disease)   . Hyperlipidemia   . Hypertension   . Mitral insufficiency     hx of  . Nephrolithiasis   . Ischemic cardiomyopathy     echo 5/09: EF 55%, mild LAE;  Myoview 11/10: inf and apical scar with mild peri-infarct ischemia, EF 44%  . Iliac artery aneurysm, left     followed by Dr. [name]  . Aortic dissection     H/O focal aortic dissection  . LBBB (left bundle branch block)   . CHF (congestive heart failure)   . Myocardial infarction   . History of kidney stones     Past Surgical History  Procedure Date  . Uteroscopy   . Coronary artery bypass graft 1998  . Left cea 07/05  . Appendectomy 09/11/11  . Carotid endarterectomy 2005    left    Review of Systems:  Neurologic: denies symptoms of TIA, amaurosis, or stroke Cardiac:denies shortness of breath or chest pain Pulmonary: denies cough or wheeze  Social History History  Substance Use Topics  . Smoking status: Former Smoker -- 1.5 packs/day  for 57 years    Types: Cigarettes    Quit date: 12/11/2004  . Smokeless tobacco: Never Used  . Alcohol Use: No    Allergies  No Known Allergies   Current Outpatient Prescriptions  Medication Sig Dispense Refill  . amiodarone (PACERONE) 200 MG tablet Take 200 mg by mouth daily.        [name] amLODipine (NORVASC) 10 MG tablet Take 10 mg by mouth daily.        [name] doxazosin (CARDURA) 8 MG tablet Take 1 tablet (8 mg total) by mouth at bedtime.  90 tablet  4  . furosemide (LASIX) 40 MG tablet Take 40 mg by mouth daily.        . hydrALAZINE (APRESOLINE) 25 MG tablet Take 1 tablet (25 mg total) by mouth 3 (three) times daily.  270 tablet  4  . lisinopril (PRINIVIL,ZESTRIL) 40 MG tablet Take 40 mg by mouth daily.        . metoprolol (TOPROL-XL) 100 MG 24 hr tablet Take 0.5 tablets (50 mg total) by mouth daily.  90 tablet  4  . Multiple Vitamin (MULTIVITAMIN) tablet Take 1 tablet by mouth daily.        . niacin (NIASPAN) 500 MG CR tablet 2 tabs po qhs      . nitroGLYCERIN (NITROSTAT) 0.4 MG SL tablet Place 0.4 mg under the tongue  every 5 (five) minutes as needed.        . potassium chloride SA (KLOR-CON M20) 20 MEQ tablet Take 40 mEq by mouth 3 (three) times daily.        . pravastatin (PRAVACHOL) 80 MG tablet Take 80 mg by mouth daily.        [name] warfarin (COUMADIN) 5 MG tablet Take As Directed per Anticoagulation Clinic  16 tablet  3   No current facility-administered medications for this visit.   Facility-Administered Medications Ordered in Other Visits  Medication Dose Route Frequency Provider Last Rate Last Dose  . iohexol (OMNIPAQUE) 350 MG/ML injection 100 mL  100 mL Intravenous Once PRN Medication Radiologist   100 mL at 11/30/11 1125    Physical Examination  Filed Vitals:   11/30/11 1135  BP: 132/74  Pulse: 46  Resp: 16  Height: 6\' 1"  (1.854 m)  Weight: 251 lb (113.853 kg)  SpO2: 95%    Body mass index is 33.12 kg/(m^2).  General:  Alert and oriented, no acute distress HEENT:  Normal Neck: No bruit or JVD Pulmonary: Clear to auscultation bilaterally Cardiac: Regular Rate and Rhythm without murmur Abdomen: Soft, non-tender, non-distended, normal bowel sounds, no palpable pulsatile mass, obese Extremities: 2+ femoral pulses bilat  DATA: CT angiogram the abdomen and pelvis was reviewed today. His left common iliac artery is ectatic and around to 2 Hasson meters in diameter. The abdominal aortic aneurysm was 4.3 cm in diameter by my measurement today.   ASSESSMENT: Small abdominal aortic aneurysm with possible mild growth over the last few years. He has significant pulmonary issues saw would not consider operative repair at this time. I also reassured the patient the risk of rupture of a 4.3 cm abdominal aortic aneurysm was much less than 0.5% per year. Continue observation for now   PLAN: Aortic ultrasound and carotid duplex in 6 months time  Fabienne Bruns, MD [facility] [place] Office: [phone] Pager: [phone]

## 2011-12-20 ENCOUNTER — Ambulatory Visit (INDEPENDENT_AMBULATORY_CARE_PROVIDER_SITE_OTHER): Payer: Medicare Other | Admitting: *Deleted

## 2011-12-20 DIAGNOSIS — I4891 Unspecified atrial fibrillation: Secondary | ICD-10-CM

## 2011-12-20 DIAGNOSIS — Z7901 Long term (current) use of anticoagulants: Secondary | ICD-10-CM

## 2012-01-25 ENCOUNTER — Telehealth: Payer: Self-pay | Admitting: Cardiology

## 2012-01-25 ENCOUNTER — Emergency Department (HOSPITAL_COMMUNITY): Payer: Medicare Other

## 2012-01-25 ENCOUNTER — Inpatient Hospital Stay (HOSPITAL_COMMUNITY)
Admission: EM | Admit: 2012-01-25 | Discharge: 2012-01-27 | DRG: 313 | Disposition: A | Discharge status: Home / Self Care | Payer: Medicare Other | Attending: Cardiology | Admitting: Cardiology

## 2012-01-25 ENCOUNTER — Encounter (HOSPITAL_COMMUNITY): Payer: Self-pay | Admitting: Physician Assistant

## 2012-01-25 DIAGNOSIS — I4821 Permanent atrial fibrillation: Secondary | ICD-10-CM | POA: Diagnosis present

## 2012-01-25 DIAGNOSIS — E669 Obesity, unspecified: Secondary | ICD-10-CM | POA: Diagnosis present

## 2012-01-25 DIAGNOSIS — E785 Hyperlipidemia, unspecified: Secondary | ICD-10-CM | POA: Diagnosis present

## 2012-01-25 DIAGNOSIS — I25810 Atherosclerosis of coronary artery bypass graft(s) without angina pectoris: Secondary | ICD-10-CM | POA: Diagnosis present

## 2012-01-25 DIAGNOSIS — I255 Ischemic cardiomyopathy: Secondary | ICD-10-CM | POA: Diagnosis present

## 2012-01-25 DIAGNOSIS — J449 Chronic obstructive pulmonary disease, unspecified: Secondary | ICD-10-CM | POA: Diagnosis present

## 2012-01-25 DIAGNOSIS — I723 Aneurysm of iliac artery: Secondary | ICD-10-CM | POA: Diagnosis present

## 2012-01-25 DIAGNOSIS — R079 Chest pain, unspecified: Secondary | ICD-10-CM

## 2012-01-25 DIAGNOSIS — I447 Left bundle-branch block, unspecified: Secondary | ICD-10-CM | POA: Diagnosis present

## 2012-01-25 DIAGNOSIS — N200 Calculus of kidney: Secondary | ICD-10-CM | POA: Diagnosis present

## 2012-01-25 DIAGNOSIS — I2589 Other forms of chronic ischemic heart disease: Secondary | ICD-10-CM

## 2012-01-25 DIAGNOSIS — Z87891 Personal history of nicotine dependence: Secondary | ICD-10-CM

## 2012-01-25 DIAGNOSIS — R0600 Dyspnea, unspecified: Secondary | ICD-10-CM

## 2012-01-25 DIAGNOSIS — Z7982 Long term (current) use of aspirin: Secondary | ICD-10-CM

## 2012-01-25 DIAGNOSIS — I509 Heart failure, unspecified: Secondary | ICD-10-CM | POA: Diagnosis present

## 2012-01-25 DIAGNOSIS — I672 Cerebral atherosclerosis: Secondary | ICD-10-CM | POA: Diagnosis present

## 2012-01-25 DIAGNOSIS — N289 Disorder of kidney and ureter, unspecified: Secondary | ICD-10-CM | POA: Diagnosis present

## 2012-01-25 DIAGNOSIS — I4891 Unspecified atrial fibrillation: Secondary | ICD-10-CM | POA: Diagnosis present

## 2012-01-25 DIAGNOSIS — Z951 Presence of aortocoronary bypass graft: Secondary | ICD-10-CM

## 2012-01-25 DIAGNOSIS — I5023 Acute on chronic systolic (congestive) heart failure: Secondary | ICD-10-CM | POA: Diagnosis present

## 2012-01-25 DIAGNOSIS — I2510 Atherosclerotic heart disease of native coronary artery without angina pectoris: Secondary | ICD-10-CM | POA: Diagnosis present

## 2012-01-25 DIAGNOSIS — Z7901 Long term (current) use of anticoagulants: Secondary | ICD-10-CM

## 2012-01-25 DIAGNOSIS — R0789 Other chest pain: Principal | ICD-10-CM | POA: Diagnosis present

## 2012-01-25 DIAGNOSIS — I498 Other specified cardiac arrhythmias: Secondary | ICD-10-CM | POA: Diagnosis present

## 2012-01-25 DIAGNOSIS — J4489 Other specified chronic obstructive pulmonary disease: Secondary | ICD-10-CM | POA: Diagnosis present

## 2012-01-25 DIAGNOSIS — Z79899 Other long term (current) drug therapy: Secondary | ICD-10-CM

## 2012-01-25 DIAGNOSIS — I10 Essential (primary) hypertension: Secondary | ICD-10-CM | POA: Diagnosis present

## 2012-01-25 LAB — DIFFERENTIAL
Eosinophils Absolute: 0.4 10*3/uL (ref 0.0–0.7)
Lymphs Abs: 1.8 10*3/uL (ref 0.7–4.0)
Monocytes Relative: 8 % (ref 3–12)
Neutrophils Relative %: 55 % (ref 43–77)

## 2012-01-25 LAB — CARDIAC PANEL(CRET KIN+CKTOT+MB+TROPI)
CK, MB: 2 ng/mL (ref 0.3–4.0)
CK, MB: 1.7 ng/mL (ref 0.3–4.0)
Relative Index: INVALID (ref 0.0–2.5)
Total CK: 59 U/L (ref 7–232)
Troponin I: <0.3 ng/mL (ref <0.30)
Troponin I: <0.3 ng/mL (ref <0.30)

## 2012-01-25 LAB — BASIC METABOLIC PANEL
CO2: 25 mEq/L (ref 19–32)
Chloride: 107 mEq/L (ref 96–112)
GFR calc non Af Amer: 52 mL/min — ABNORMAL LOW (ref >90)
Glucose, Bld: 97 mg/dL (ref 70–99)
Potassium: 3.6 mEq/L (ref 3.5–5.1)
Sodium: 142 mEq/L (ref 135–145)

## 2012-01-25 LAB — PROTIME-INR: INR: 2.37 — ABNORMAL HIGH (ref 0.00–1.49)

## 2012-01-25 LAB — CBC
HCT: 42.1 % (ref 39.0–52.0)
Hemoglobin: 14.1 g/dL (ref 13.0–17.0)
MCH: 30.8 pg (ref 26.0–34.0)
RBC: 4.58 MIL/uL (ref 4.22–5.81)

## 2012-01-25 LAB — APTT: aPTT: 45 seconds — ABNORMAL HIGH (ref 24–37)

## 2012-01-25 MED ORDER — SODIUM CHLORIDE 0.9 % IJ SOLN: 3.0000 mL | Freq: Two times a day (BID) | INTRAMUSCULAR | Status: DC

## 2012-01-25 MED ORDER — SIMVASTATIN 40 MG PO TABS
40.0000 mg | ORAL_TABLET | Freq: Every day | ORAL | Status: DC
  Administered 2012-01-25: 40 mg via ORAL
  Filled 2012-01-25 (×2): qty 1

## 2012-01-25 MED ORDER — NIACIN ER (ANTIHYPERLIPIDEMIC) 500 MG PO TBCR
1000.0000 mg | EXTENDED_RELEASE_TABLET | Freq: Two times a day (BID) | ORAL | Status: DC
  Administered 2012-01-25 – 2012-01-27 (×3): 1000 mg via ORAL
  Filled 2012-01-25 (×5): qty 2

## 2012-01-25 MED ORDER — ONDANSETRON HCL 4 MG/2ML IJ SOLN
4.0000 mg | INTRAMUSCULAR | Status: DC | PRN
  Administered 2012-01-25: 4 mg via INTRAVENOUS
  Filled 2012-01-25: qty 2

## 2012-01-25 MED ORDER — AMLODIPINE BESYLATE 10 MG PO TABS
10.0000 mg | ORAL_TABLET | Freq: Every day | ORAL | Status: DC
  Administered 2012-01-26 – 2012-01-27 (×2): 10 mg via ORAL
  Filled 2012-01-25 (×2): qty 1

## 2012-01-25 MED ORDER — AMIODARONE HCL 200 MG PO TABS
200.0000 mg | ORAL_TABLET | Freq: Every day | ORAL | Status: DC
  Administered 2012-01-26 – 2012-01-27 (×2): 200 mg via ORAL
  Filled 2012-01-25 (×2): qty 1

## 2012-01-25 MED ORDER — ASPIRIN 81 MG PO TBEC: 81.0000 mg | DELAYED_RELEASE_TABLET | Freq: Every day | ORAL | Status: DC

## 2012-01-25 MED ORDER — METOPROLOL SUCCINATE ER 25 MG PO TB24
25.0000 mg | ORAL_TABLET | Freq: Every day | ORAL | Status: DC
  Administered 2012-01-27: 25 mg via ORAL
  Filled 2012-01-25 (×2): qty 1

## 2012-01-25 MED ORDER — FUROSEMIDE 10 MG/ML IJ SOLN
40.0000 mg | Freq: Once | INTRAMUSCULAR | Status: AC
  Administered 2012-01-25: 40 mg via INTRAVENOUS
  Filled 2012-01-25: qty 4

## 2012-01-25 MED ORDER — LISINOPRIL 40 MG PO TABS
40.0000 mg | ORAL_TABLET | Freq: Every day | ORAL | Status: DC
  Administered 2012-01-26 – 2012-01-27 (×2): 40 mg via ORAL
  Filled 2012-01-25 (×2): qty 1

## 2012-01-25 MED ORDER — MORPHINE SULFATE 4 MG/ML IJ SOLN
4.0000 mg | Freq: Once | INTRAMUSCULAR | Status: AC
  Administered 2012-01-25: 4 mg via INTRAVENOUS
  Filled 2012-01-25: qty 1

## 2012-01-25 MED ORDER — ZOLPIDEM TARTRATE 5 MG PO TABS: 5.0000 mg | ORAL_TABLET | Freq: Every evening | ORAL | Status: DC | PRN

## 2012-01-25 MED ORDER — ONDANSETRON HCL 4 MG/2ML IJ SOLN: 4.0000 mg | Freq: Four times a day (QID) | INTRAMUSCULAR | Status: DC | PRN

## 2012-01-25 MED ORDER — NITROGLYCERIN IN D5W 200-5 MCG/ML-% IV SOLN
5.0000 ug/min | Freq: Once | INTRAVENOUS | Status: AC
  Administered 2012-01-25: 5 ug/min via INTRAVENOUS
  Filled 2012-01-25: qty 250

## 2012-01-25 MED ORDER — NITROGLYCERIN 0.4 MG SL SUBL: 0.4000 mg | SUBLINGUAL_TABLET | SUBLINGUAL | Status: DC | PRN

## 2012-01-25 MED ORDER — ASPIRIN EC 81 MG PO TBEC
81.0000 mg | DELAYED_RELEASE_TABLET | Freq: Every day | ORAL | Status: DC
  Administered 2012-01-26 – 2012-01-27 (×2): 81 mg via ORAL
  Filled 2012-01-25 (×2): qty 1

## 2012-01-25 MED ORDER — NITROGLYCERIN IN D5W 200-5 MCG/ML-% IV SOLN: 3.0000 ug/min | INTRAVENOUS | Status: DC

## 2012-01-25 MED ORDER — SODIUM CHLORIDE 0.9 % IJ SOLN: 3.0000 mL | INTRAMUSCULAR | Status: DC | PRN

## 2012-01-25 MED ORDER — ADULT MULTIVITAMIN W/MINERALS CH
1.0000 | ORAL_TABLET | Freq: Every day | ORAL | Status: DC
  Administered 2012-01-26 – 2012-01-27 (×2): 1 via ORAL
  Filled 2012-01-25 (×2): qty 1

## 2012-01-25 MED ORDER — POTASSIUM CHLORIDE CRYS ER 20 MEQ PO TBCR
40.0000 meq | EXTENDED_RELEASE_TABLET | Freq: Three times a day (TID) | ORAL | Status: DC
  Administered 2012-01-25 – 2012-01-27 (×5): 40 meq via ORAL
  Filled 2012-01-25 (×9): qty 2

## 2012-01-25 MED ORDER — FUROSEMIDE 40 MG PO TABS
40.0000 mg | ORAL_TABLET | Freq: Every day | ORAL | Status: DC
  Administered 2012-01-26 – 2012-01-27 (×2): 40 mg via ORAL
  Filled 2012-01-25 (×2): qty 1

## 2012-01-25 MED ORDER — HYDRALAZINE HCL 25 MG PO TABS
25.0000 mg | ORAL_TABLET | Freq: Three times a day (TID) | ORAL | Status: DC
  Administered 2012-01-25 – 2012-01-27 (×4): 25 mg via ORAL
  Filled 2012-01-25 (×7): qty 1

## 2012-01-25 MED ORDER — SODIUM CHLORIDE 0.9 % IV SOLN: 250.0000 mL | INTRAVENOUS | Status: DC | PRN

## 2012-01-25 MED ORDER — ACETAMINOPHEN 325 MG PO TABS: 650.0000 mg | ORAL_TABLET | ORAL | Status: DC | PRN

## 2012-01-25 MED ORDER — DOXAZOSIN MESYLATE 8 MG PO TABS
8.0000 mg | ORAL_TABLET | Freq: Every day | ORAL | Status: DC
  Administered 2012-01-25 – 2012-01-26 (×2): 8 mg via ORAL
  Filled 2012-01-25 (×3): qty 1

## 2012-01-25 MED ORDER — IOHEXOL 300 MG/ML  SOLN
100.0000 mL | Freq: Once | INTRAMUSCULAR | Status: AC | PRN
  Administered 2012-01-25: 100 mL via INTRAVENOUS

## 2012-01-25 MED ORDER — ALPRAZOLAM 0.25 MG PO TABS: 0.2500 mg | ORAL_TABLET | Freq: Two times a day (BID) | ORAL | Status: DC | PRN

## 2012-01-25 NOTE — Telephone Encounter (Signed)
Patient called stating he is having chest pain.States pain started 1 hr ago ,took NTG x 1 with no relief.States has vomited x 1.States chest pain is severe,pain # 10.Advised to call EMS and go to [facility] was called.

## 2012-01-25 NOTE — ED Notes (Signed)
Per dr Shirlee Latch pt okayed to eat and drink, secretary to order heart healthy tray

## 2012-01-25 NOTE — ED Provider Notes (Signed)
History     CSN: [id number]  Arrival date & time 01/25/12  1312   First MD Initiated Contact with Patient 01/25/12 1351      Chief Complaint  Patient presents with  . Chest Pain    (Consider location/radiation/quality/duration/timing/severity/associated sxs/prior treatment) HPI Comments: Patient with a history of CAD, atrial fibrillation, hyperlipidemia, hypertension, and aortic dissection presents emergency Department with chief complaint of left-sided chest pain.  Patient states pain is located substernally and in the left side of his chest, pain is described as sharp and pressure-like, severity is range at an 8/10. Patient denies radiation of pain to arm or jaw.  Onset of pain occurred when patient was at rest and has been constant since 11 a.m. this morning. Patient states pain is associated with nusea, emisis x 1, diaphoresis, dyspnea on exertion and shortness of breath.  Patient denies edema PND, orthopnea, cough, hemoptysis, claudication, weakness, fevers, night sweats and chills. Patient states that he took 324 aspirin and 2 nitros at home without relief.  At this point patient called 911 and in route patient received 2 more nitroglycerin doses.  Patient states chest pain is still present.  The history is provided by the patient.    Past Medical History  Diagnosis Date  . Atrial fibrillation     s/p prior DCCV;  Amiodarone Rx.  . CAD (coronary artery disease)     s/p CABG;  cath 10/07:  LM ok, LAD occluded, AV CFX 95%, pRCA occluded; L-LAD, S-OM2/OM3, S-PDA ok  . Cerebrovascular disease     s/p prior Left CEA;  followed by Dr. [name]  . COPD (chronic obstructive pulmonary disease)   . Hyperlipidemia   . Hypertension   . Mitral insufficiency     hx of  . Nephrolithiasis   . Ischemic cardiomyopathy     echo 5/09: EF 55%, mild LAE;  Myoview 11/10: inf and apical scar with mild peri-infarct ischemia, EF 44%  . Iliac artery aneurysm, left     followed by Dr. [name]  . Aortic  dissection     H/O focal aortic dissection  . LBBB (left bundle branch block)   . CHF (congestive heart failure)   . Myocardial infarction   . History of kidney stones     Past Surgical History  Procedure Date  . Uteroscopy   . Coronary artery bypass graft 1998  . Left cea 07/05  . Appendectomy 09/11/11  . Carotid endarterectomy 2005    left    Family History  Problem Relation Age of Onset  . Stroke Maternal Uncle     History  Substance Use Topics  . Smoking status: Former Smoker -- 1.5 packs/day for 57 years    Types: Cigarettes    Quit date: 12/11/2004  . Smokeless tobacco: Never Used  . Alcohol Use: No      Review of Systems  Constitutional: Positive for activity change. Negative for fever, chills, diaphoresis, fatigue and unexpected weight change.  HENT: Negative for congestion, neck pain and neck stiffness.   Eyes: Negative for visual disturbance.  Respiratory: Positive for chest tightness and shortness of breath. Negative for apnea, cough, wheezing and stridor.   Cardiovascular: Positive for chest pain. Negative for palpitations and leg swelling.  Gastrointestinal: Positive for nausea and vomiting. Negative for abdominal pain, diarrhea and blood in stool.  Genitourinary: Negative for dysuria, urgency, hematuria and flank pain.  Musculoskeletal: Negative for myalgias, back pain and gait problem.  Skin: Negative for pallor.  Neurological: Negative for  dizziness, syncope, light-headedness and headaches.  All other systems reviewed and are negative.    Allergies  Review of patient's allergies indicates no known allergies.  Home Medications   Current Outpatient Rx  Name Route Sig Dispense Refill  . AMIODARONE HCL 200 MG PO TABS Oral Take 200 mg by mouth daily.      [name] AMLODIPINE BESYLATE 10 MG PO TABS Oral Take 10 mg by mouth daily.      [name] DOXAZOSIN MESYLATE 8 MG PO TABS Oral Take 8 mg by mouth at bedtime.    . FUROSEMIDE 40 MG PO TABS Oral Take 40 mg by  mouth daily.      [name] HYDRALAZINE HCL 25 MG PO TABS Oral Take 25 mg by mouth 3 (three) times daily.    [name] LISINOPRIL 40 MG PO TABS Oral Take 40 mg by mouth daily.      [name] METOPROLOL SUCCINATE ER 100 MG PO TB24 Oral Take 50 mg by mouth daily. Take with or immediately following a meal.    . ADULT MULTIVITAMIN W/MINERALS CH Oral Take 1 tablet by mouth daily.    [name] NIACIN ER (ANTIHYPERLIPIDEMIC) 500 MG PO TBCR Oral Take 1,000 mg by mouth 2 (two) times daily. 2 tabs po qhs    . NITROGLYCERIN 0.4 MG SL SUBL Sublingual Place 0.4 mg under the tongue every 5 (five) minutes as needed. For chest pain    . POTASSIUM CHLORIDE CRYS ER 20 MEQ PO TBCR Oral Take 40 mEq by mouth 3 (three) times daily.      [name] PRAVASTATIN SODIUM 80 MG PO TABS Oral Take 80 mg by mouth daily.      . WARFARIN SODIUM 5 MG PO TABS Oral Take 5-7.5 mg by mouth daily. Monday and Friday = 7.5 mg.   5 mg all other days    . ASPIRIN 81 MG PO CHEW Oral Chew 324 mg by mouth once.       BP 116/57  Pulse 45  Temp(Src) 97.9 F (36.6 C) (Oral)  Resp 12  SpO2 96%  Physical Exam  Nursing note and vitals reviewed. Constitutional: He appears well-developed and well-nourished. No distress.  HENT:  Head: Normocephalic and atraumatic.  Eyes: Conjunctivae and EOM are normal. Pupils are equal, round, and reactive to light.  Neck: Normal range of motion. Neck supple. Normal carotid pulses and no JVD present. Carotid bruit is not present. No rigidity. Normal range of motion present.  Cardiovascular: Normal rate, regular rhythm, S1 normal, S2 normal, normal heart sounds, intact distal pulses and normal pulses.  Exam reveals no gallop and no friction rub.   No murmur heard.      No pitting edema bilaterally,  Bradycardic regular rhythm, no aberrant sounds on auscultations, distal pulses intact, no carotid bruit or JVD.   Pulmonary/Chest: Effort normal and breath sounds normal. No accessory muscle usage or stridor. No respiratory distress. He exhibits no  tenderness and no bony tenderness.  Abdominal: Bowel sounds are normal.       Soft non tender. Non pulsatile aorta.   Skin: Skin is warm, dry and intact. No rash noted. He is not diaphoretic. No cyanosis. Nails show no clubbing.    ED Course  Procedures (including critical care time)  Labs Reviewed  BASIC METABOLIC PANEL - Abnormal; Notable for the following:    GFR calc non Af Amer 52 (*)    GFR calc Af Amer 61 (*)    All other components within normal limits  CBC -  Abnormal; Notable for the following:    Platelets 140 (*)    All other components within normal limits  DIFFERENTIAL - Abnormal; Notable for the following:    Eosinophils Relative 7 (*)    All other components within normal limits  PROTIME-INR - Abnormal; Notable for the following:    Prothrombin Time 26.3 (*)    INR 2.37 (*)    All other components within normal limits  APTT - Abnormal; Notable for the following:    aPTT 45 (*)    All other components within normal limits  PRO B NATRIURETIC PEPTIDE - Abnormal; Notable for the following:    Pro B Natriuretic peptide (BNP) 693.8 (*)    All other components within normal limits  CARDIAC PANEL(CRET KIN+CKTOT+MB+TROPI)  D-DIMER, QUANTITATIVE  TROPONIN I   Ct Angio Chest W/cm &/or Wo Cm  01/25/2012  *RADIOLOGY REPORT*  Clinical Data: Shortness breath.  History of aortic dissection.  CT ANGIOGRAPHY CHEST  Technique:  Multidetector CT imaging of the chest using the standard protocol during bolus administration of intravenous contrast. Multiplanar reconstructed images including MIPs were obtained and reviewed to evaluate the vascular anatomy.  Contrast: OMNIPAQUE IOHEXOL 300 MG/ML IV SOLN  Comparison: Plain film of earlier today.  CT 10/29/2008.  Findings: Lung windows demonstrate moderate centrilobular emphysema. No airspace disease.  Soft tissue windows demonstrate no evidence of intramural hematoma on unenhanced images.  Normal caliber of the great vessels.  No  evidence of aortic dissection or aneurysm.  Moderate aortic atherosclerosis.  Mild cardiomegaly with prior median sternotomy for CABG.  Trace right pleural fluid or thickening.  No pericardial effusion.  No large pulmonary embolism, on this non dedicated study.  Pulmonary arteries are enlarged, with the outflow tract measuring 3.6 cm.  No mediastinal or hilar adenopathy.  A tiny hiatal hernia.  Limited abdominal imaging demonstrates lateral segment left liver lobe cysts. Normal adrenal glands.  Remote right rib trauma including posterolateral eighth through tenth ribs.  Probable Schmorl's node deformity at the superior endplate of T8, unchanged.  IMPRESSION:  1.  No evidence of aortic dissection or other explanation for left- sided pain. 2.  Moderate centrilobular emphysema. 3. Pulmonary artery enlargement suggests pulmonary arterial hypertension. 4.  Remote right rib trauma.  Original Report Authenticated By: Consuello Bossier, M.D.   Dg Chest Port 1 View  01/25/2012  *RADIOLOGY REPORT*  Clinical Data: Chest pain.  PORTABLE CHEST - 1 VIEW  Comparison: Chest x-ray 09/13/2011.  Findings: The heart is enlarged but stable.  Stable surgical changes from bypass surgery.  There is mild vascular congestion but no overt to pulmonary edema, focal pulmonary filtrates or pleural effusion.  IMPRESSION:  Cardiac enlargement and mild vascular congestion but no edema or effusions.  Stable basilar scarring changes.  Original Report Authenticated By: P. Loralie Champagne, M.D.     No diagnosis found. CRITICAL CARE Performed by: Jaci Carrel  CRITICAL CARE Performed by: Jaci Carrel   Total critical care time: 30  Critical care time was exclusive of separately billable procedures and treating other patients.  Critical care was necessary to treat or prevent imminent or life-threatening deterioration.  Critical care was time spent personally by me on the following activities: development of treatment plan with patient  and/or surrogate as well as nursing, discussions with consultants, evaluation of patient's response to treatment, examination of patient, obtaining history from patient or surrogate, ordering and performing treatments and interventions, ordering and review of laboratory studies, ordering and review of radiographic  studies, pulse oximetry and re-evaluation of patient's condition.   Date: 01/25/2012  Rate: 46  Rhythm: normal sinus rhythm  QRS Axis: normal  Intervals: normal  ST/T Wave abnormalities: normal  Conduction Disutrbances:left bundle branch block  Narrative Interpretation:   Old EKG Reviewed: unchanged     MDM  Chest pain  Patient placed on a nitro drip and given 4 morphine and 4 Zofran on arrival.  CTA chest ordered to rule out aortic dissection and pulmonary embolism.  Radiology showed no indication of acute etiology of chest pain.  First round of cardiac markers has been negative and patient is therapeutic on Coumadin.  Patient will be admitted for further evaluation and observation for question unstable angina.  This case has been discussed with Dr. Ranae Palms who agrees with my plan to admit the patient. Pro-BNP elevated at 693.8. [facility] to admit pt.         Jaci Carrel, [place] 01/25/12 1649

## 2012-01-25 NOTE — ED Notes (Signed)
EMS-pt reports left sided chest pain that started this am. Took 324 asa and 2nitros at home without relief. 2 more nitros given en route, reports 7/10 chest pain. 18(R)AC.

## 2012-01-25 NOTE — Telephone Encounter (Signed)
New Msg: Pt calling c/o chest pain, pain on left side and pt has also vomited one time.    Pt call transferred to triage.

## 2012-01-25 NOTE — H&P (Signed)
History and Physical   Patient ID: [name] [name] MRN: [id number], DOB/AGE: 1941/04/10   Admit date: 01/25/2012 Date of Consult: 01/25/2012   Primary Physician: [name], MD, MD Primary Cardiologist: Olga Millers, MD  Pt. Profile: [name] [name] is a 71 yo male with PMHx significant for CAD (s/p CABG; last cath in 10/07 revealed patent grafts; recent Myoview 11/10- EF 44%, prior inf and apical infarct with mild peri-infarct ischemia), ICM (most recent echo in 2009 reveals LVEF 55-60%, abnormal interventricular septal motion, mildly dilated LV and LA; improved from prior study), history of persistent atrial fibrillation (requiring multiple DCCVs, NSR maintained on Amiodarone, chronically anticoagulated on Coumadin) history of aortic dissection, HTN, HL and COPD who presents to [facility] ED with chest pain.   Problem List: Past Medical History  Diagnosis Date  . Atrial fibrillation     s/p prior DCCV;  Amiodarone Rx.  . CAD (coronary artery disease)     s/p CABG;  cath 10/07:  LM ok, LAD occluded, AV CFX 95%, pRCA occluded; L-LAD, S-OM2/OM3, S-PDA ok  . Cerebrovascular disease     s/p prior Left CEA;  followed by Dr. [name]  . COPD (chronic obstructive pulmonary disease)   . Hyperlipidemia   . Hypertension   . Mitral insufficiency     hx of  . Nephrolithiasis   . Ischemic cardiomyopathy     echo 5/09: EF 55%, mild LAE;  Myoview 11/10: inf and apical scar with mild peri-infarct ischemia, EF 44%  . Iliac artery aneurysm, left     followed by Dr. [name]  . Aortic dissection     H/O focal aortic dissection  . LBBB (left bundle branch block)   . CHF (congestive heart failure)   . Myocardial infarction   . History of kidney stones     Past Surgical History  Procedure Date  . Uteroscopy   . Coronary artery bypass graft 1998  . Left cea 07/05  . Appendectomy 09/11/11  . Carotid endarterectomy 2005    left     Allergies: No Known Allergies  HPI:   Patient reports left-sided  chest pain occurring at 11am this morning, it was constant, described as sharp, rated a 8/10, unrelieved by NTG x 2. He took low-dose ASA x 4. He was given given NTG x 2 in transit to [facility]. He states that he has occasional chest pain lasting only seconds, similar to today's episode. He states that this episode is constant and more severe. This was associated with sob, emesis, palpitations, lightheadedness. He endorses occasional LE edema. At baseline, he exercises on the elliptical and recently began doing anaerobic exercises. He does not recall straining a muscle. The pain is not reproduced by these exercises.  He denies orthopnea, PND, sick contacts, fevers, chills, cough.   First set of CEs negative. D-dimer WNL. pBNP elevated at 693.8. INR therapeutic. CXR reveals cardiac enlargement with mild vascular congestion but no edema or effusions. CT-A chest with no evidence of aortic dissection or PE. Stable basilar scarring. VSS. No acute events on telemetry. His pain has been relieved on NTG gtt.   Inpatient Medications:     . furosemide  40 mg Intravenous Once  .  morphine injection  4 mg Intravenous Once  . nitroGLYCERIN  5-200 mcg/min Intravenous Once    (Not in a hospital admission)  Family History  Problem Relation Age of Onset  . Stroke Maternal Uncle      History   Social History  . Marital Status:  Married    Spouse Name: N/A    Number of Children: N/A  . Years of Education: N/A   Occupational History  . RETIRED---truck driver    Social History Main Topics  . Smoking status: Former Smoker -- 1.5 packs/day for 57 years    Types: Cigarettes    Quit date: 12/11/2004  . Smokeless tobacco: Never Used  . Alcohol Use: No  . Drug Use: No  . Sexually Active: Not on file   Other Topics Concern  . Not on file   Social History Narrative   Lives in [place], [place] with wife.      Review of Systems: General: negative for chills, fever, night sweats or weight changes.    Cardiovascular: positive for chest pain, palpitations, lightheadedness, shortness of breath, negative for dyspnea on exertion, edema, orthopneaparoxysmal nocturnal dyspnea Dermatological: negative for rash Respiratory: negative for cough or wheezing Urologic: negative for hematuria Abdominal: positive for vomiting negative for nausea, diarrhea, bright red blood per rectum, melena, or hematemesis Neurologic: negative for visual changes, syncope, or dizziness All other systems reviewed and are otherwise negative except as noted above.  Physical Exam: Blood pressure 131/61, pulse 44, temperature 98.2 F (36.8 C), temperature source Oral, resp. rate 19, SpO2 96.00%.   General: Well developed, obese,  in NAD Head: Normocephalic, atraumatic, sclera non-icteric, no xanthomas Neck: Negative for carotid bruits. JVD mildly elevated. Lungs: Distant breath sounds, without wheezes, rales, or rhonchi. Breathing is unlabored.  Heart: RRR with S1 S2. No murmurs, rubs, or gallops appreciated. Abdomen: Soft, non-tender, non-distended with normoactive bowel sounds. No hepatomegaly. No rebound/guarding. No obvious abdominal masses. Msk:  Strength and tone appears normal for age. Extremities: No clubbing, cyanosis or edema.  Distal pedal pulses are 2+ and equal bilaterally. Neuro: Alert and oriented X 3. Moves all extremities spontaneously. Psych:  Responds to questions appropriately with a normal affect.  Labs: Recent Labs  Basename 01/25/12 1347   WBC 6.3   HGB 14.1   HCT 42.1   MCV 91.9   PLT 140*   Recent Labs  Basename 01/25/12 1347   DDIMER <0.22    Lab 01/25/12 1347  NA 142  K 3.6  CL 107  CO2 25  BUN 19  CREATININE 1.34  CALCIUM 8.8  PROT --  BILITOT --  ALKPHOS --  ALT --  AST --  AMYLASE --  LIPASE --  GLUCOSE 97    Recent Labs  Basename 01/25/12 1353   CKTOTAL 66   CKMB 2.0   CKMBINDEX --   TROPONINI <0.30   Radiology/Studies: Ct Angio Chest W/cm &/or Wo  Cm  01/25/2012  *RADIOLOGY REPORT*  Clinical Data: Shortness breath.  History of aortic dissection.  CT ANGIOGRAPHY CHEST  Technique:  Multidetector CT imaging of the chest using the standard protocol during bolus administration of intravenous contrast. Multiplanar reconstructed images including MIPs were obtained and reviewed to evaluate the vascular anatomy.  Contrast: OMNIPAQUE IOHEXOL 300 MG/ML IV SOLN  Comparison: Plain film of earlier today.  CT 10/29/2008.  Findings: Lung windows demonstrate moderate centrilobular emphysema. No airspace disease.  Soft tissue windows demonstrate no evidence of intramural hematoma on unenhanced images.  Normal caliber of the great vessels.  No evidence of aortic dissection or aneurysm.  Moderate aortic atherosclerosis.  Mild cardiomegaly with prior median sternotomy for CABG.  Trace right pleural fluid or thickening.  No pericardial effusion.  No large pulmonary embolism, on this non dedicated study.  Pulmonary arteries are enlarged, with  the outflow tract measuring 3.6 cm.  No mediastinal or hilar adenopathy.  A tiny hiatal hernia.  Limited abdominal imaging demonstrates lateral segment left liver lobe cysts. Normal adrenal glands.  Remote right rib trauma including posterolateral eighth through tenth ribs.  Probable Schmorl's node deformity at the superior endplate of T8, unchanged.  IMPRESSION:  1.  No evidence of aortic dissection or other explanation for left- sided pain. 2.  Moderate centrilobular emphysema. 3. Pulmonary artery enlargement suggests pulmonary arterial hypertension. 4.  Remote right rib trauma.  Original Report Authenticated By: Consuello Bossier, M.D.   Dg Chest Port 1 View  01/25/2012  *RADIOLOGY REPORT*  Clinical Data: Chest pain.  PORTABLE CHEST - 1 VIEW  Comparison: Chest x-ray 09/13/2011.  Findings: The heart is enlarged but stable.  Stable surgical changes from bypass surgery.  There is mild vascular congestion but no overt to pulmonary edema,  focal pulmonary filtrates or pleural effusion.  IMPRESSION:  Cardiac enlargement and mild vascular congestion but no edema or effusions.  Stable basilar scarring changes.  Original Report Authenticated By: P. Loralie Champagne, M.D.    EKG: sinus bradycardia, 1st degree AVB, LBBB, poor R wave progression V1-V4,   ASSESSMENT AND PLAN:   1. Chest pain- atypical; ruled out for aortic dissection, PE and infectious causes. He does have a significant cardiac history and risk factors. Patient does not endorse increased episodes of chest pain leading up to today. He has remained fairly active with no decrease in activity level. The pain is sharp in quality, constant and unrelieved by NTG. It is somewhat reproducible upon palpation to chest wall. First set of CEs negative. EKG without evidence of acute ischemia, and resembles prior tracing done in 07/12.   - Will admit to telemetry for ACS rule out   - Cycles CEs  - Daily EKGs  - Continue daily ASA/ACEi/NTG SL PRN  - Will start Hep gtt if INR drops below 2  - Will order echo  2. Atrial fibrillation- paroxysmal; maintaining NSR today.   - Continue Amiodarone  - Hold Coumadin while inpatient  - Will continue to monitor  3. Sinus bradycardia- HR 47 today, 53 in 07/12, seemingly patients baseline. He denies symptoms with this.   - Will decrease BB for now, hold for HR <50  4. HTN- relatively well-controlled  - Will continue outpatient antihypertensives with exception to down-titrated Toprol-XL  Signed, R. Hurman Horn, PA-C 01/25/2012, 5:32 PM   Patient seen with PA, agree with above note.  Patient with h/o CAD s/p CABG, ischemic cardiomyopathy (last EF 44%), PAF, localized aortic dissection, and AAA presents for evaluation of chest pain.  Chest pain was atypical, waxed and waned most of morning.  Not related to exertion.  Of note, he has been working out with weights with his upper body recently, but chest is not tender.  1. Chest pain: Somewhat  atypical.  No changes on ECG (LBBB), cardiac enzymes negative.  Will rule out for MI.  Hold coumadin in case cath is needed (IV heparin for INR< 2).  If rules out and no further chest pain, plan outpatient myoview.  2. Bradycardia: On Toprol XL 50 mg daily.  Decrease to 25 mg daily and hold for HR < 50.  3. CHF: EF 44% on last myoview.  Probable mild acute on chronic systolic CHF.  He is short of breath walking a short distance chronically and has elevated JVP.  BNP elevated.  Will give 40 mg IV Lasix tonight, po  Lasix tomorrow (on 40 mg po qd at home, may need to increase his chronic home dose).    Marca Ancona 01/25/2012 5:42 PM

## 2012-01-25 NOTE — Progress Notes (Signed)
Notified MD pt had an chest pain that lasted for a couple seconds that came and went sudden then left bp 105/54 hr 47 no new orders Ilean Skill LPN

## 2012-01-26 ENCOUNTER — Other Ambulatory Visit: Payer: Self-pay

## 2012-01-26 DIAGNOSIS — I517 Cardiomegaly: Secondary | ICD-10-CM

## 2012-01-26 DIAGNOSIS — R0600 Dyspnea, unspecified: Secondary | ICD-10-CM

## 2012-01-26 DIAGNOSIS — I2510 Atherosclerotic heart disease of native coronary artery without angina pectoris: Secondary | ICD-10-CM

## 2012-01-26 DIAGNOSIS — I4891 Unspecified atrial fibrillation: Secondary | ICD-10-CM

## 2012-01-26 LAB — BASIC METABOLIC PANEL
BUN: 21 mg/dL (ref 6–23)
BUN: 19 mg/dL (ref 6–23)
CO2: 27 mEq/L (ref 19–32)
CO2: 27 mEq/L (ref 19–32)
CO2: 27 mEq/L (ref 19–32)
Calcium: 8.8 mg/dL (ref 8.4–10.5)
Chloride: 109 mEq/L (ref 96–112)
Chloride: 109 mEq/L (ref 96–112)
Chloride: 109 mEq/L (ref 96–112)
Creatinine, Ser: 1.59 mg/dL — ABNORMAL HIGH (ref 0.50–1.35)
GFR calc Af Amer: 56 mL/min — ABNORMAL LOW (ref >90)
Glucose, Bld: 96 mg/dL (ref 70–99)
Potassium: 3.9 mEq/L (ref 3.5–5.1)
Potassium: 3.9 mEq/L (ref 3.5–5.1)
Potassium: 3.6 mEq/L (ref 3.5–5.1)
Sodium: 144 mEq/L (ref 135–145)

## 2012-01-26 MED ORDER — BUDESONIDE 0.25 MG/2ML IN SUSP
0.2500 mg | Freq: Two times a day (BID) | RESPIRATORY_TRACT | Status: DC
  Administered 2012-01-26 – 2012-01-27 (×2): 0.25 mg via RESPIRATORY_TRACT
  Filled 2012-01-26 (×5): qty 2

## 2012-01-26 MED ORDER — ROSUVASTATIN CALCIUM 10 MG PO TABS
10.0000 mg | ORAL_TABLET | Freq: Every day | ORAL | Status: DC
  Administered 2012-01-26: 10 mg via ORAL
  Filled 2012-01-26 (×2): qty 1

## 2012-01-26 MED ORDER — BIOTENE DRY MOUTH MT LIQD
15.0000 mL | Freq: Two times a day (BID) | OROMUCOSAL | Status: DC
  Administered 2012-01-26 (×2): 15 mL via OROMUCOSAL

## 2012-01-26 MED ORDER — ALBUTEROL SULFATE (5 MG/ML) 0.5% IN NEBU
2.5000 mg | INHALATION_SOLUTION | Freq: Four times a day (QID) | RESPIRATORY_TRACT | Status: DC
  Administered 2012-01-26 – 2012-01-27 (×5): 2.5 mg via RESPIRATORY_TRACT
  Filled 2012-01-26 (×6): qty 0.5

## 2012-01-26 MED ORDER — SODIUM CHLORIDE 0.9 % IJ SOLN
3.0000 mL | Freq: Two times a day (BID) | INTRAMUSCULAR | Status: DC
  Administered 2012-01-26: 3 mL via INTRAVENOUS

## 2012-01-26 NOTE — ED Provider Notes (Signed)
Medical screening examination/treatment/procedure(s) were conducted as a shared visit with non-physician practitioner(s) and myself.  I personally evaluated the patient during the encounter  Pt comfortable on nitro drip. VS stable. [facility] to admit  Loren Racer, MD 01/26/12 [id number]

## 2012-01-26 NOTE — Progress Notes (Signed)
Subjective: Patient still with chest tightness, worse with breath.   A couple spells of CP last night. Objective: Filed Vitals:   01/25/12 1907 01/25/12 1914 01/25/12 2159 01/26/12 0500  BP: 105/54  106/54 116/54  Pulse: 47  44 60  Temp: 97.8 F (36.6 C)  97.8 F (36.6 C) 98.2 F (36.8 C)  TempSrc: Oral  Oral Oral  Resp: 18  20 18   Height: 6\' 1"  (1.854 m)     Weight:  245 lb 9.6 oz (111.403 kg)  241 lb 8 oz (109.544 kg)  SpO2: 95%  93% 93%   Weight change:   Intake/Output Summary (Last 24 hours) at 01/26/12 2952 Last data filed at 01/25/12 2100  Gross per 24 hour  Intake      0 ml  Output    250 ml  Net   -250 ml    General: Alert, awake, oriented x3, in no acute distress Neck:  JVP is normal Heart: Regular rate and rhythm, without murmurs, rubs, gallops.  Lungs: Wheezes with forced expiration.  Decreased airflow. Exemities:  No edema.   Neuro: Grossly intact, nonfocal.  Tele  Lab Results: Results for orders placed during the hospital encounter of 01/25/12 (from the past 24 hour(s))  BASIC METABOLIC PANEL     Status: Abnormal   Collection Time   01/25/12  1:47 PM      Component Value Range   Sodium 142  135 - 145 (mEq/L)   Potassium 3.6  3.5 - 5.1 (mEq/L)   Chloride 107  96 - 112 (mEq/L)   CO2 25  19 - 32 (mEq/L)   Glucose, Bld 97  70 - 99 (mg/dL)   BUN 19  6 - 23 (mg/dL)   Creatinine, Ser 8.41  0.50 - 1.35 (mg/dL)   Calcium 8.8  8.4 - 32.4 (mg/dL)   GFR calc non Af Amer 52 (*) >90 (mL/min)   GFR calc Af Amer 61 (*) >90 (mL/min)  CBC     Status: Abnormal   Collection Time   01/25/12  1:47 PM      Component Value Range   WBC 6.3  4.0 - 10.5 (K/uL)   RBC 4.58  4.22 - 5.81 (MIL/uL)   Hemoglobin 14.1  13.0 - 17.0 (g/dL)   HCT 40.1  02.7 - 25.3 (%)   MCV 91.9  78.0 - 100.0 (fL)   MCH 30.8  26.0 - 34.0 (pg)   MCHC 33.5  30.0 - 36.0 (g/dL)   RDW 66.4  40.3 - 47.4 (%)   Platelets 140 (*) 150 - 400 (K/uL)  DIFFERENTIAL     Status: Abnormal   Collection Time   01/25/12  1:47 PM      Component Value Range   Neutrophils Relative 55  43 - 77 (%)   Neutro Abs 3.5  1.7 - 7.7 (K/uL)   Lymphocytes Relative 29  12 - 46 (%)   Lymphs Abs 1.8  0.7 - 4.0 (K/uL)   Monocytes Relative 8  3 - 12 (%)   Monocytes Absolute 0.5  0.1 - 1.0 (K/uL)   Eosinophils Relative 7 (*) 0 - 5 (%)   Eosinophils Absolute 0.4  0.0 - 0.7 (K/uL)   Basophils Relative 1  0 - 1 (%)   Basophils Absolute 0.1  0.0 - 0.1 (K/uL)  PROTIME-INR     Status: Abnormal   Collection Time   01/25/12  1:47 PM      Component Value Range   Prothrombin Time 26.3 (*)  11.6 - 15.2 (seconds)   INR 2.37 (*) 0.00 - 1.49   APTT     Status: Abnormal   Collection Time   01/25/12  1:47 PM      Component Value Range   aPTT 45 (*) 24 - 37 (seconds)  D-DIMER, QUANTITATIVE     Status: Normal   Collection Time   01/25/12  1:47 PM      Component Value Range   D-Dimer, Quant <0.22  0.00 - 0.48 (ug/mL-FEU)  CARDIAC PANEL(CRET KIN+CKTOT+MB+TROPI)     Status: Normal   Collection Time   01/25/12  1:53 PM      Component Value Range   Total CK 66  7 - 232 (U/L)   CK, MB 2.0  0.3 - 4.0 (ng/mL)   Troponin I <0.30  <0.30 (ng/mL)   Relative Index RELATIVE INDEX IS INVALID  0.0 - 2.5   PRO B NATRIURETIC PEPTIDE     Status: Abnormal   Collection Time   01/25/12  2:16 PM      Component Value Range   Pro B Natriuretic peptide (BNP) 693.8 (*) 0 - 125 (pg/mL)  TROPONIN I     Status: Normal   Collection Time   01/25/12  5:01 PM      Component Value Range   Troponin I <0.30  <0.30 (ng/mL)  CARDIAC PANEL(CRET KIN+CKTOT+MB+TROPI)     Status: Normal   Collection Time   01/25/12  9:59 PM      Component Value Range   Total CK 59  7 - 232 (U/L)   CK, MB 1.7  0.3 - 4.0 (ng/mL)   Troponin I <0.30  <0.30 (ng/mL)   Relative Index RELATIVE INDEX IS INVALID  0.0 - 2.5   CARDIAC PANEL(CRET KIN+CKTOT+MB+TROPI)     Status: Normal   Collection Time   01/26/12  3:41 AM      Component Value Range   Total CK 55  7 - 232 (U/L)   CK,  MB 1.7  0.3 - 4.0 (ng/mL)   Troponin I <0.30  <0.30 (ng/mL)   Relative Index RELATIVE INDEX IS INVALID  0.0 - 2.5   BASIC METABOLIC PANEL     Status: Abnormal   Collection Time   01/26/12  3:41 AM      Component Value Range   Sodium 144  135 - 145 (mEq/L)   Potassium 3.9  3.5 - 5.1 (mEq/L)   Chloride 109  96 - 112 (mEq/L)   CO2 27  19 - 32 (mEq/L)   Glucose, Bld 91  70 - 99 (mg/dL)   BUN 21  6 - 23 (mg/dL)   Creatinine, Ser 1.61 (*) 0.50 - 1.35 (mg/dL)   Calcium 8.7  8.4 - 09.6 (mg/dL)   GFR calc non Af Amer 43 (*) >90 (mL/min)   GFR calc Af Amer 49 (*) >90 (mL/min)  PROTIME-INR     Status: Abnormal   Collection Time   01/26/12  3:41 AM      Component Value Range   Prothrombin Time 28.3 (*) 11.6 - 15.2 (seconds)   INR 2.60 (*) 0.00 - 1.49     Studies/Results: Ct Angio Chest W/cm &/or Wo Cm  01/25/2012  *RADIOLOGY REPORT*  Clinical Data: Shortness breath.  History of aortic dissection.  CT ANGIOGRAPHY CHEST  Technique:  Multidetector CT imaging of the chest using the standard protocol during bolus administration of intravenous contrast. Multiplanar reconstructed images including MIPs were obtained and reviewed to evaluate the vascular  anatomy.  Contrast: OMNIPAQUE IOHEXOL 300 MG/ML IV SOLN  Comparison: Plain film of earlier today.  CT 10/29/2008.  Findings: Lung windows demonstrate moderate centrilobular emphysema. No airspace disease.  Soft tissue windows demonstrate no evidence of intramural hematoma on unenhanced images.  Normal caliber of the great vessels.  No evidence of aortic dissection or aneurysm.  Moderate aortic atherosclerosis.  Mild cardiomegaly with prior median sternotomy for CABG.  Trace right pleural fluid or thickening.  No pericardial effusion.  No large pulmonary embolism, on this non dedicated study.  Pulmonary arteries are enlarged, with the outflow tract measuring 3.6 cm.  No mediastinal or hilar adenopathy.  A tiny hiatal hernia.  Limited abdominal imaging  demonstrates lateral segment left liver lobe cysts. Normal adrenal glands.  Remote right rib trauma including posterolateral eighth through tenth ribs.  Probable Schmorl's node deformity at the superior endplate of T8, unchanged.  IMPRESSION:  1.  No evidence of aortic dissection or other explanation for left- sided pain. 2.  Moderate centrilobular emphysema. 3. Pulmonary artery enlargement suggests pulmonary arterial hypertension. 4.  Remote right rib trauma.  Original Report Authenticated By: Consuello Bossier, M.D.   Dg Chest Port 1 View  01/25/2012  *RADIOLOGY REPORT*  Clinical Data: Chest pain.  PORTABLE CHEST - 1 VIEW  Comparison: Chest x-ray 09/13/2011.  Findings: The heart is enlarged but stable.  Stable surgical changes from bypass surgery.  There is mild vascular congestion but no overt to pulmonary edema, focal pulmonary filtrates or pleural effusion.  IMPRESSION:  Cardiac enlargement and mild vascular congestion but no edema or effusions.  Stable basilar scarring changes.  Original Report Authenticated By: P. Loralie Champagne, M.D.    Medications: I have reviewed the patient's current medications.   Patient Active Hospital Problem List: Dyspnea:   Exam with some decreased airflow and wheezes.  Hx tob  Will Rx with NMTs. Chest pain (07/24/2011)   Assessment: Has r/o for MI.  I am not convinced represents angina.  Atypical description.  Follow.  Echo pending.   Plan: HYPERCHOLESTEROLEMIA, PURE (10/18/2008)   Assessment:  Continue ZOcor.and niaspan.   Plan: HYPERTENSION, BENIGN (10/18/2008)   Assessment: Adequate control   Plan: CORONARY ATHEROSCLEROSIS NATIVE CORONARY ARTERY (10/12/2009)   Assessment:  CP is atypical.  R/O for MI.  Will d/c ntg.  Question if some of symptosm of SOB are due to pulmonary.  BNP is mildly increased yesterday.  Did get IV lasix x 1.  Will give PO today.  Watch BMET in AM.   Plan:  CARDIOMYOPATHY, ISCHEMIC (10/18/2008)   Assessment:  BNP is mildly elevated  WIll  follow as cr is milldy increased.  Noted LVEEF on echo in 2009 was normal.  Repeat echo today.     Plan: Atrial fibrillation (10/18/2008)   Assessment:   Persistent.  On amio and coumadin. Rates controlled on telemetry.   Plan:  Long term current use of anticoagulant (02/21/2011)   Assessment:  INR is still therapeutic.  Coumadin is on hold for right now.    Check in AM. Either resume or start lovenox depending on plans.   LOS: 1 day   Dietrich Pates 01/26/2012, 8:21 AM

## 2012-01-26 NOTE — Progress Notes (Signed)
*  PRELIMINARY RESULTS* Echocardiogram 2D Echocardiogram has been performed.  [name] [name] 01/26/2012, 1:59 PM

## 2012-01-27 ENCOUNTER — Encounter (HOSPITAL_COMMUNITY): Payer: Self-pay | Admitting: Physician Assistant

## 2012-01-27 ENCOUNTER — Other Ambulatory Visit: Payer: Self-pay

## 2012-01-27 DIAGNOSIS — I5023 Acute on chronic systolic (congestive) heart failure: Secondary | ICD-10-CM

## 2012-01-27 LAB — PROTIME-INR: INR: 2.35 — ABNORMAL HIGH (ref 0.00–1.49)

## 2012-01-27 MED ORDER — DOXAZOSIN MESYLATE 8 MG PO TABS: 4.0000 mg | ORAL_TABLET | Freq: Every day | ORAL | Status: DC

## 2012-01-27 MED ORDER — ASPIRIN 81 MG PO TBEC: 81.0000 mg | DELAYED_RELEASE_TABLET | Freq: Every day | ORAL | Status: DC

## 2012-01-27 MED ORDER — METOPROLOL SUCCINATE ER 25 MG PO TB24: 25.0000 mg | ORAL_TABLET | Freq: Every day | ORAL | Status: DC

## 2012-01-27 NOTE — Progress Notes (Signed)
Patient ID: [name] [name], male   DOB: 20-Aug-1941, 71 y.o.   MRN: [id number] SUBJECTIVE: [name] [name] is without any chest discomfort or shortness of breath this morning. He wants to go home. He is followed by [name] [name].  Filed Vitals:   01/26/12 2007 01/26/12 2100 01/27/12 0500 01/27/12 0807  BP:  88/40 129/69   Pulse: 55 51 55   Temp:  98 F (36.7 C) 98.5 F (36.9 C)   TempSrc:  Oral Oral   Resp: 18 18 18    Height:      Weight:   110.043 kg (242 lb 9.6 oz)   SpO2:  95% 94% 95%    Intake/Output Summary (Last 24 hours) at 01/27/12 0948 Last data filed at 01/27/12 0700  Gross per 24 hour  Intake    760 ml  Output      0 ml  Net    760 ml    LABS: Basic Metabolic Panel:  Basename 01/26/12 2132 01/26/12 1100  NA 144 143  K 3.6 3.9  CL 109 109  CO2 27 27  GLUCOSE 117* 96  BUN 23 19  CREATININE 1.69* 1.44*  CALCIUM 8.8 9.1  MG -- --  PHOS -- --   Liver Function Tests: No results found for this basename: AST:2,ALT:2,ALKPHOS:2,BILITOT:2,PROT:2,ALBUMIN:2 in the last 72 hours No results found for this basename: LIPASE:2,AMYLASE:2 in the last 72 hours CBC:  Basename 01/25/12 1347  WBC 6.3  NEUTROABS 3.5  HGB 14.1  HCT 42.1  MCV 91.9  PLT 140*   Cardiac Enzymes:  Basename 01/26/12 0341 01/25/12 2159 01/25/12 1701 01/25/12 1353  CKTOTAL 55 59 -- 66  CKMB 1.7 1.7 -- 2.0  CKMBINDEX -- -- -- --  TROPONINI <0.30 <0.30 <0.30 --   BNP: No components found with this basename: POCBNP:3 D-Dimer:  Basename 01/25/12 1347  DDIMER <0.22   Hemoglobin A1C: No results found for this basename: HGBA1C in the last 72 hours Fasting Lipid Panel: No results found for this basename: CHOL,HDL,LDLCALC,TRIG,CHOLHDL,LDLDIRECT in the last 72 hours Thyroid Function Tests: No results found for this basename: TSH,T4TOTAL,FREET3,T3FREE,THYROIDAB in the last 72 hours Anemia Panel: No results found for this basename: VITAMINB12,FOLATE,FERRITIN,TIBC,IRON,RETICCTPCT in the last 72  hours  RADIOLOGY: Ct Angio Chest W/cm &/or Wo Cm  01/25/2012  *RADIOLOGY REPORT*  Clinical Data: Shortness breath.  History of aortic dissection.  CT ANGIOGRAPHY CHEST  Technique:  Multidetector CT imaging of the chest using the standard protocol during bolus administration of intravenous contrast. Multiplanar reconstructed images including MIPs were obtained and reviewed to evaluate the vascular anatomy.  Contrast: OMNIPAQUE IOHEXOL 300 MG/ML IV SOLN  Comparison: Plain film of earlier today.  CT 10/29/2008.  Findings: Lung windows demonstrate moderate centrilobular emphysema. No airspace disease.  Soft tissue windows demonstrate no evidence of intramural hematoma on unenhanced images.  Normal caliber of the great vessels.  No evidence of aortic dissection or aneurysm.  Moderate aortic atherosclerosis.  Mild cardiomegaly with prior median sternotomy for CABG.  Trace right pleural fluid or thickening.  No pericardial effusion.  No large pulmonary embolism, on this non dedicated study.  Pulmonary arteries are enlarged, with the outflow tract measuring 3.6 cm.  No mediastinal or hilar adenopathy.  A tiny hiatal hernia.  Limited abdominal imaging demonstrates lateral segment left liver lobe cysts. Normal adrenal glands.  Remote right rib trauma including posterolateral eighth through tenth ribs.  Probable Schmorl's node deformity at the superior endplate of T8, unchanged.  IMPRESSION:  1.  No evidence  of aortic dissection or other explanation for left- sided pain. 2.  Moderate centrilobular emphysema. 3. Pulmonary artery enlargement suggests pulmonary arterial hypertension. 4.  Remote right rib trauma.  Original Report Authenticated By: Consuello Bossier, M.D.   Dg Chest Port 1 View  01/25/2012  *RADIOLOGY REPORT*  Clinical Data: Chest pain.  PORTABLE CHEST - 1 VIEW  Comparison: Chest x-ray 09/13/2011.  Findings: The heart is enlarged but stable.  Stable surgical changes from bypass surgery.  There is mild  vascular congestion but no overt to pulmonary edema, focal pulmonary filtrates or pleural effusion.  IMPRESSION:  Cardiac enlargement and mild vascular congestion but no edema or effusions.  Stable basilar scarring changes.  Original Report Authenticated By: P. Loralie Champagne, M.D.    Physical examination  Is in no acute distress. Alert and oriented x3. No JVD. Lungs are clear to auscultation percussion. Heart reveals a poorly appreciated PMI. Irregular rate and rhythm. No gallop. Abdominal exam is soft and nondistended. Good bowel sounds. Extremities reveal no edema.  TELEMETRY: Reviewed telemetry pt in chronic A. fib  ASSESSMENT AND PLAN:  Principal Problem:  *Chest pain Active Problems:  HYPERCHOLESTEROLEMIA, PURE  HYPERTENSION, BENIGN  CORONARY ATHEROSCLEROSIS NATIVE CORONARY ARTERY  CARDIOMYOPATHY, ISCHEMIC  Atrial fibrillation  Long term current use of anticoagulant  Dyspnea   Patient is ready for discharge. His echocardiogram shows a reduction in LV function with ejection fraction 25-30%. This is a decrease from his last echocardiogram several years ago. However, looking back previous echo is ejection fractions have been in this range of around 30%.  I reviewed his medication list. He is on doxazosin for blood pressure. This is a contraindication with systolic heart failure. We'll reduce to 4 mg each bedtime and this can be discontinued by [name] [name] in the office. We do not want to withdraw too quickly.  All questions answered. Schedule followup [name] [name] within 2 weeks. No other changes in his medications. Valera Castle, MD 01/27/2012 9:48 AM

## 2012-01-27 NOTE — Progress Notes (Signed)
Pt provided heart failure packet.  Information reviewed with pt and wife.  Questions answered.  Pt verbalized understanding of information.  DC instructions reviewed.  Rx provided to pt.  IV removed with cath tip intact.  Tele dc'ed.  Pt dc'ed from facility via wheelchair.  [name] [name], [facility]

## 2012-01-27 NOTE — Discharge Summary (Signed)
Discharge Summary   Patient ID: [name] [name] MRN: [id number], DOB/AGE: August 15, 1941 71 y.o. Admit date: 01/25/2012 D/C date:     01/27/2012   Primary Discharge Diagnoses:  1. Acute on chronic systolic CHF with hx of ICM - EF 20-25% 01/26/12 ( previously 44% on last myoview 11/10) 2. Chest pain felt atypical - CTA negative for PE - negative cardiac enzymes 3. CAD s/p hx of CABG 4. Acute renal insufficiency  Secondary Discharge Diagnoses:  1. Carotid disease s/p prior L CEA 2. COPD 3. HTN 4. HL 5. Hx of mitral insufficiency 6. Nephrolithiasis 7. Iliac artery aneurysm 8. H/o focal aortic dissection  9. LBBB  Hospital Course: 71 y/o M with history of CAD s/p CABG presented with L sided CP occuring at 11 on day of admission, constant, described as sharp, rated a 8/10, unrelieved by NTG x 2. He took low-dose ASA x 4. He was given given NTG x 2 in transit to [facility]. He states that he has occasional chest pain lasting only seconds, similar to today's episode except this episode was constant and more severe. First set of CEs were negative, D-dimer WNL. pBNP elevated at 693.8. INR therapeutic. CXR revealed cardiac enlargement with mild vascular congestion but no edema or effusions. CT-A chest was with no evidence of aortic dissection or PE. He did have question of PAH and this was relayed to Dr. [name] at discharge. The patient's pain had been relieved on NTG gtt, but was also somewhat reproducible on the chest [name] [name]. It was felt somewhat atypical. Coumadin was held in case cath was needed. He was somewhat bradycardic so Toprol was decreased. He was felt to have a comopnent of mild acute on chronic systolic CHF & was treated with IV lasix. 2D echo showed EF of 20-25%. This was a decrease from last echo several years ago - however, looking back on previous echoes Dr. Daleen Squibb noted his previous EF was around 30% as well. Dr. Daleen Squibb recommended reducing his doxazosin given contraindication with systolic CHF.  He would like the patient to follow up with Dr. [name] in several weeks for discontinuation of this medicine so as to not stop it abruptly. [name] [name] takes it for blood pressure. The patient has since felt better with diuresis and is without CP or SOB today. Dr. Daleen Squibb has seen and examined him today and feels he is stable for discharge. He may need consideration of ICD implantation at some point if EF remains low. His creatinine bumped slightly so he will have his BMET rechecked when he comes in for his INR visit.  Discharge Vitals: Blood pressure 106/59, pulse 55, temperature 98.5 F (36.9 C), temperature source Oral, resp. rate 18, height 6\' 1"  (1.854 m), weight 242 lb 9.6 oz (110.043 kg), SpO2 95.00%.  Labs: Lab Results  Component Value Date   WBC 6.3 01/25/2012   HGB 14.1 01/25/2012   HCT 42.1 01/25/2012   MCV 91.9 01/25/2012   PLT 140* 01/25/2012     Lab 01/26/12 2132  NA 144  K 3.6  CL 109  CO2 27  BUN 23  CREATININE 1.69*  CALCIUM 8.8  PROT --  BILITOT --  ALKPHOS --  ALT --  AST --  GLUCOSE 117*    Basename 01/26/12 0341 01/25/12 2159 01/25/12 1701 01/25/12 1353  CKTOTAL 55 59 -- 66  CKMB 1.7 1.7 -- 2.0  TROPONINI <0.30 <0.30 <0.30 <0.30   Lab Results  Component Value Date   CHOL 138 07/10/2011  HDL 36* 07/10/2011   LDLCALC 65 07/10/2011   TRIG 185* 07/10/2011   Lab Results  Component Value Date   DDIMER <0.22 01/25/2012    Diagnostic Studies/Procedures   1. Ct Angio Chest W/cm &/or Wo Cm 01/25/2012  *RADIOLOGY REPORT*  Clinical Data: Shortness breath.  History of aortic dissection.  CT ANGIOGRAPHY CHEST  Technique:  Multidetector CT imaging of the chest using the standard protocol during bolus administration of intravenous contrast. Multiplanar reconstructed images including MIPs were obtained and reviewed to evaluate the vascular anatomy.  Contrast: OMNIPAQUE IOHEXOL 300 MG/ML IV SOLN  Comparison: Plain film of earlier today.  CT 10/29/2008.  Findings: Lung  windows demonstrate moderate centrilobular emphysema. No airspace disease.  Soft tissue windows demonstrate no evidence of intramural hematoma on unenhanced images.  Normal caliber of the great vessels.  No evidence of aortic dissection or aneurysm.  Moderate aortic atherosclerosis.  Mild cardiomegaly with prior median sternotomy for CABG.  Trace right pleural fluid or thickening.  No pericardial effusion.  No large pulmonary embolism, on this non dedicated study.  Pulmonary arteries are enlarged, with the outflow tract measuring 3.6 cm.  No mediastinal or hilar adenopathy.  A tiny hiatal hernia.  Limited abdominal imaging demonstrates lateral segment left liver lobe cysts. Normal adrenal glands.  Remote right rib trauma including posterolateral eighth through tenth ribs.  Probable Schmorl's node deformity at the superior endplate of T8, unchanged.  IMPRESSION:  1.  No evidence of aortic dissection or other explanation for left- sided pain. 2.  Moderate centrilobular emphysema. 3. Pulmonary artery enlargement suggests pulmonary arterial hypertension. 4.  Remote right rib trauma.  Original Report Authenticated By: Consuello Bossier, M.D.   2. Dg Chest Port 1 View 01/25/2012  *RADIOLOGY REPORT*  Clinical Data: Chest pain.  PORTABLE CHEST - 1 VIEW  Comparison: Chest x-ray 09/13/2011.  Findings: The heart is enlarged but stable.  Stable surgical changes from bypass surgery.  There is mild vascular congestion but no overt to pulmonary edema, focal pulmonary filtrates or pleural effusion.  IMPRESSION:  Cardiac enlargement and mild vascular congestion but no edema or effusions.  Stable basilar scarring changes.  Original Report Authenticated By: P. Loralie Champagne, M.D.   3. 2D echo 01/26/12 Study Conclusions - Left ventricle: [name] [name] thickness was increased in a pattern of moderate LVH. Systolic function was severely reduced. The estimated ejection fraction was in the range of 20% to 25%.- Left atrium: The atrium was  moderately dilated.  Discharge Medications   Medication List  As of 01/27/2012  1:06 PM   STOP taking these medications         aspirin 81 MG chewable tablet         TAKE these medications         amiodarone 200 MG tablet   Commonly known as: PACERONE   Take 200 mg by mouth daily.      amLODipine 10 MG tablet   Commonly known as: NORVASC   Take 10 mg by mouth daily.      aspirin 81 MG EC tablet   Take 1 tablet (81 mg total) by mouth daily.      doxazosin 8 MG tablet   Commonly known as: CARDURA   Take 0.5 tablets (4 mg total) by mouth at bedtime.      furosemide 40 MG tablet   Commonly known as: LASIX   Take 40 mg by mouth daily.      hydrALAZINE 25 MG tablet  Commonly known as: APRESOLINE   Take 25 mg by mouth 3 (three) times daily.      KLOR-CON M20 20 MEQ tablet   Generic drug: potassium chloride SA   Take 40 mEq by mouth 3 (three) times daily.      lisinopril 40 MG tablet   Commonly known as: PRINIVIL,ZESTRIL   Take 40 mg by mouth daily.      metoprolol succinate 25 MG 24 hr tablet   Commonly known as: TOPROL-XL   Take 1 tablet (25 mg total) by mouth daily.      mulitivitamin with minerals Tabs   Take 1 tablet by mouth daily.      niacin 500 MG CR tablet   Commonly known as: NIASPAN   Take 1,000 mg by mouth 2 (two) times daily. 2 tabs po qhs      nitroGLYCERIN 0.4 MG SL tablet   Commonly known as: NITROSTAT   Place 0.4 mg under the tongue every 5 (five) minutes as needed. For chest pain      pravastatin 80 MG tablet   Commonly known as: PRAVACHOL   Take 80 mg by mouth daily.      warfarin 5 MG tablet   Commonly known as: COUMADIN   Take 5-7.5 mg by mouth daily. Monday and Friday = 7.5 mg.   5 mg all other days            Disposition   The patient will be discharged in stable condition to home. Discharge Orders    Future Appointments: Provider: Department: Dept Phone: Center:   01/31/2012 10:00 AM Raul Del, RN [facility]  [phone] None   05/30/2012 9:00 AM [facility] [phone] [facility]   05/30/2012 9:30 AM [facility] [phone] [facility]   05/30/2012 10:30 AM Sherren Kerns, MD [facility] [phone] [facility]     Future Orders Please Complete By Expires   Diet - low sodium heart healthy      Increase activity slowly        Follow-up Information    Follow up with [facility]. (Coumadin check 01/31/12 at 10am)    Contact information:   [address] [place] [address]       Follow up with Olga Millers, MD. (Our office will call you for an appointment)    Contact information:   [address] [place] [address] [phone]            Duration of Discharge Encounter: Greater than 30 minutes including physician and PA time.  Signed, Dayna Dunn PA-C 01/27/2012, 1:06 PM  Jesse Sans. Daleen Squibb, MD, [facility] Pager:  [phone]

## 2012-01-28 ENCOUNTER — Telehealth: Payer: Self-pay | Admitting: Cardiology

## 2012-01-28 NOTE — Telephone Encounter (Signed)
Ok  [name] [name]  

## 2012-01-31 ENCOUNTER — Ambulatory Visit (INDEPENDENT_AMBULATORY_CARE_PROVIDER_SITE_OTHER): Payer: Medicare Other | Admitting: Pharmacist

## 2012-01-31 DIAGNOSIS — I4891 Unspecified atrial fibrillation: Secondary | ICD-10-CM

## 2012-01-31 DIAGNOSIS — Z7901 Long term (current) use of anticoagulants: Secondary | ICD-10-CM

## 2012-01-31 LAB — POCT INR: INR: 1.8

## 2012-02-15 ENCOUNTER — Other Ambulatory Visit: Payer: Self-pay | Admitting: *Deleted

## 2012-02-15 ENCOUNTER — Ambulatory Visit (INDEPENDENT_AMBULATORY_CARE_PROVIDER_SITE_OTHER): Payer: Medicare Other | Admitting: Cardiology

## 2012-02-15 ENCOUNTER — Ambulatory Visit (INDEPENDENT_AMBULATORY_CARE_PROVIDER_SITE_OTHER): Payer: Medicare Other | Admitting: *Deleted

## 2012-02-15 ENCOUNTER — Encounter: Payer: Self-pay | Admitting: Cardiology

## 2012-02-15 DIAGNOSIS — I4891 Unspecified atrial fibrillation: Secondary | ICD-10-CM

## 2012-02-15 DIAGNOSIS — I6529 Occlusion and stenosis of unspecified carotid artery: Secondary | ICD-10-CM

## 2012-02-15 DIAGNOSIS — I2510 Atherosclerotic heart disease of native coronary artery without angina pectoris: Secondary | ICD-10-CM

## 2012-02-15 DIAGNOSIS — I2589 Other forms of chronic ischemic heart disease: Secondary | ICD-10-CM

## 2012-02-15 DIAGNOSIS — Z7901 Long term (current) use of anticoagulants: Secondary | ICD-10-CM

## 2012-02-15 LAB — POCT INR: INR: 1.9

## 2012-02-15 MED ORDER — HYDRALAZINE HCL 50 MG PO TABS: 50.0000 mg | ORAL_TABLET | Freq: Three times a day (TID) | ORAL | Status: DC

## 2012-02-15 NOTE — Assessment & Plan Note (Signed)
Continue statin. 

## 2012-02-15 NOTE — Patient Instructions (Signed)
Your physician recommends that you schedule a follow-up appointment in: 8 WEEKS  Your physician has requested that you have a lexiscan myoview. For further information please visit [email or web address] Please follow instruction sheet, as given. SCHEDULE IN 6 WEEKS  INCREASE HYDRALAZINE 50 MG THREE TIMES DAILY  Your physician recommends that you return for lab work in: TODAY

## 2012-02-15 NOTE — Assessment & Plan Note (Signed)
Patient remains in sinus rhythm. Continue amiodarone. Check TSH and liver functions. Note he had a recent chest CT in the hospital. Continue Coumadin.

## 2012-02-15 NOTE — Assessment & Plan Note (Signed)
Followed by vascular surgery. 

## 2012-02-15 NOTE — Progress Notes (Signed)
HPI: Pleasant male for FU of CAD. He has a history of coronary artery disease, status post coronary artery bypassing graft, history of ischemic cardiomyopathy as well as COPD. His last catheterization in October 2007 showed patent grafts. Note his LV function had improved. He also has a history of focal aortic dissection being managed medically.  Note, he also has a left iliac artery aneurysm and mild cerebrovascular disease with history of carotid endarterectomy. He is followed by vascular surgery (Dr. [name]) for these issues. Most recent Myoview was performed in November of 2010 and showed an ejection fraction of 44%, prior inferior and apical infarct with mild peri-infarct ischemia. It was felt to be low risk and he was therefore treated medically. Patient recently admitted in February with what was felt to be atypical chest pain. Cardiac enzymes and d-dimer negative. CTA showed no aortic dissection or pulmonary embolus. Echocardiogram showed an ejection fraction of 20-25% which was worse compared to previous; moderate left atrial enlargement. Since he was dced, the patient denies any dyspnea on exertion, orthopnea, PND, pedal edema, palpitations, syncope or chest pain.    Current Outpatient Prescriptions  Medication Sig Dispense Refill  . amiodarone (PACERONE) 200 MG tablet Take 200 mg by mouth daily.        [name] amLODipine (NORVASC) 10 MG tablet Take 10 mg by mouth daily.        [name] aspirin 81 MG EC tablet Take 1 tablet (81 mg total) by mouth daily.      [name] doxazosin (CARDURA) 8 MG tablet Take 0.5 tablets (4 mg total) by mouth at bedtime.      . furosemide (LASIX) 40 MG tablet Take 40 mg by mouth daily.        . hydrALAZINE (APRESOLINE) 25 MG tablet Take 25 mg by mouth 3 (three) times daily.      [name] lisinopril (PRINIVIL,ZESTRIL) 40 MG tablet Take 40 mg by mouth daily.        . metoprolol succinate (TOPROL-XL) 25 MG 24 hr tablet Take 1 tablet (25 mg total) by mouth daily.  30 tablet  6  . Multiple  Vitamin (MULITIVITAMIN WITH MINERALS) TABS Take 1 tablet by mouth daily.      . niacin (NIASPAN) 500 MG CR tablet Take 1,000 mg by mouth 2 (two) times daily. 2 tabs po qhs      . nitroGLYCERIN (NITROSTAT) 0.4 MG SL tablet Place 0.4 mg under the tongue every 5 (five) minutes as needed. For chest pain      . potassium chloride SA (KLOR-CON M20) 20 MEQ tablet Take 40 mEq by mouth 3 (three) times daily.        . pravastatin (PRAVACHOL) 80 MG tablet Take 80 mg by mouth daily.        [name] warfarin (COUMADIN) 5 MG tablet Take 5-7.5 mg by mouth daily. Monday and Friday = 7.5 mg.   5 mg all other days         Past Medical History  Diagnosis Date  . Atrial fibrillation     s/p prior DCCV;  Amiodarone Rx.  . CAD (coronary artery disease)     s/p CABG;  cath 10/07:  LM ok, LAD occluded, AV CFX 95%, pRCA occluded; L-LAD, S-OM2/OM3, S-PDA ok  . Cerebrovascular disease     s/p prior Left CEA;  followed by Dr. [name]  . COPD (chronic obstructive pulmonary disease)   . Hyperlipidemia   . Hypertension   . Mitral insufficiency  hx of. not noted on echo 01/2012  . Nephrolithiasis   . Ischemic cardiomyopathy     echo 5/09: EF 55%, mild LAE;  Myoview 11/10: inf and apical scar with mild peri-infarct ischemia, EF 44%. Echo 01/26/12 with EF 20-25% but previously known to be 30% on echoes before last one  . Iliac artery aneurysm, left     followed by Dr. [name]  . Aortic dissection     H/O focal aortic dissection  . LBBB (left bundle branch block)   . CHF (congestive heart failure)   . Myocardial infarction   . Renal insufficiency     01/2012    Past Surgical History  Procedure Date  . Uteroscopy   . Coronary artery bypass graft 1998  . Left cea 07/05  . Appendectomy 09/11/11  . Carotid endarterectomy 2005    left    History   Social History  . Marital Status: Married    Spouse Name: N/A    Number of Children: N/A  . Years of Education: N/A   Occupational History  . RETIRED---truck driver      Social History Main Topics  . Smoking status: Former Smoker -- 1.5 packs/day for 57 years    Types: Cigarettes    Quit date: 12/11/2004  . Smokeless tobacco: Never Used  . Alcohol Use: No  . Drug Use: No  . Sexually Active: Not on file   Other Topics Concern  . Not on file   Social History Narrative   Lives in [place], [place] with wife.     ROS: no fevers or chills, productive cough, hemoptysis, dysphasia, odynophagia, melena, hematochezia, dysuria, hematuria, rash, seizure activity, orthopnea, PND, pedal edema, claudication. Remaining systems are negative.  Physical Exam: Well-developed well-nourished in no acute distress.  Skin is warm and dry.  HEENT is normal.  Neck is supple. No thyromegaly.  Chest is diminished breath sounds throughout Cardiovascular exam is regular rate and rhythm.  Abdominal exam nontender or distended. No masses palpated. Extremities show no edema. neuro grossly intact

## 2012-02-15 NOTE — Assessment & Plan Note (Signed)
Goal INR 2-3

## 2012-02-15 NOTE — Assessment & Plan Note (Signed)
LV function is worse compared to previous. Plan increase hydralazine to 50 mg by mouth 3 times a day. Continue ACE inhibitor and beta blocker. Plan Lexis scan Myoview in 6 weeks. This will screen for recurrent ischemia and reassess LV function. If ejection fraction less than 35% refer to electrophysiology for consideration of ICD.

## 2012-02-15 NOTE — Assessment & Plan Note (Signed)
Blood pressure controlled. Continue present medications. 

## 2012-02-15 NOTE — Assessment & Plan Note (Addendum)
Followed by vascular surgery. Continue statin. Discontinue aspirin because of chronic Coumadin use.

## 2012-02-15 NOTE — Assessment & Plan Note (Signed)
Monitored by vascular surgery

## 2012-02-16 ENCOUNTER — Other Ambulatory Visit: Payer: Self-pay | Admitting: *Deleted

## 2012-02-16 DIAGNOSIS — E0590 Thyrotoxicosis, unspecified without thyrotoxic crisis or storm: Secondary | ICD-10-CM

## 2012-02-16 LAB — HEPATIC FUNCTION PANEL
Albumin: 4 g/dL (ref 3.5–5.2)
Alkaline Phosphatase: 70 U/L (ref 39–117)

## 2012-02-29 ENCOUNTER — Other Ambulatory Visit (INDEPENDENT_AMBULATORY_CARE_PROVIDER_SITE_OTHER): Payer: Medicare Other

## 2012-02-29 ENCOUNTER — Ambulatory Visit (INDEPENDENT_AMBULATORY_CARE_PROVIDER_SITE_OTHER): Payer: Medicare Other | Admitting: Pharmacist

## 2012-02-29 DIAGNOSIS — Z7901 Long term (current) use of anticoagulants: Secondary | ICD-10-CM

## 2012-02-29 DIAGNOSIS — I4891 Unspecified atrial fibrillation: Secondary | ICD-10-CM

## 2012-02-29 DIAGNOSIS — E0590 Thyrotoxicosis, unspecified without thyrotoxic crisis or storm: Secondary | ICD-10-CM

## 2012-02-29 LAB — T3, FREE: T3, Free: 2.3 pg/mL (ref 2.3–4.2)

## 2012-02-29 LAB — TSH: TSH: 0.54 u[IU]/mL (ref 0.35–5.50)

## 2012-03-28 ENCOUNTER — Ambulatory Visit (INDEPENDENT_AMBULATORY_CARE_PROVIDER_SITE_OTHER): Payer: Medicare Other | Admitting: *Deleted

## 2012-03-28 ENCOUNTER — Ambulatory Visit (HOSPITAL_COMMUNITY): Payer: Medicare Other | Attending: Cardiology | Admitting: Radiology

## 2012-03-28 VITALS — BP 108/67 | Ht 73.0 in | Wt 233.0 lb

## 2012-03-28 DIAGNOSIS — Z7901 Long term (current) use of anticoagulants: Secondary | ICD-10-CM

## 2012-03-28 DIAGNOSIS — I2510 Atherosclerotic heart disease of native coronary artery without angina pectoris: Secondary | ICD-10-CM | POA: Insufficient documentation

## 2012-03-28 DIAGNOSIS — R61 Generalized hyperhidrosis: Secondary | ICD-10-CM | POA: Insufficient documentation

## 2012-03-28 DIAGNOSIS — E663 Overweight: Secondary | ICD-10-CM | POA: Insufficient documentation

## 2012-03-28 DIAGNOSIS — I4891 Unspecified atrial fibrillation: Secondary | ICD-10-CM

## 2012-03-28 DIAGNOSIS — R112 Nausea with vomiting, unspecified: Secondary | ICD-10-CM | POA: Insufficient documentation

## 2012-03-28 DIAGNOSIS — Z87891 Personal history of nicotine dependence: Secondary | ICD-10-CM | POA: Insufficient documentation

## 2012-03-28 DIAGNOSIS — I10 Essential (primary) hypertension: Secondary | ICD-10-CM | POA: Insufficient documentation

## 2012-03-28 DIAGNOSIS — I739 Peripheral vascular disease, unspecified: Secondary | ICD-10-CM | POA: Insufficient documentation

## 2012-03-28 DIAGNOSIS — I447 Left bundle-branch block, unspecified: Secondary | ICD-10-CM | POA: Insufficient documentation

## 2012-03-28 DIAGNOSIS — Z951 Presence of aortocoronary bypass graft: Secondary | ICD-10-CM | POA: Insufficient documentation

## 2012-03-28 DIAGNOSIS — E785 Hyperlipidemia, unspecified: Secondary | ICD-10-CM | POA: Insufficient documentation

## 2012-03-28 DIAGNOSIS — I779 Disorder of arteries and arterioles, unspecified: Secondary | ICD-10-CM | POA: Insufficient documentation

## 2012-03-28 DIAGNOSIS — R079 Chest pain, unspecified: Secondary | ICD-10-CM | POA: Insufficient documentation

## 2012-03-28 DIAGNOSIS — R0609 Other forms of dyspnea: Secondary | ICD-10-CM | POA: Insufficient documentation

## 2012-03-28 DIAGNOSIS — I509 Heart failure, unspecified: Secondary | ICD-10-CM

## 2012-03-28 DIAGNOSIS — R0989 Other specified symptoms and signs involving the circulatory and respiratory systems: Secondary | ICD-10-CM | POA: Insufficient documentation

## 2012-03-28 MED ORDER — TECHNETIUM TC 99M TETROFOSMIN IV KIT
33.0000 | PACK | Freq: Once | INTRAVENOUS | Status: AC | PRN
  Administered 2012-03-28: 33 via INTRAVENOUS

## 2012-03-28 MED ORDER — TECHNETIUM TC 99M TETROFOSMIN IV KIT
11.0000 | PACK | Freq: Once | INTRAVENOUS | Status: AC | PRN
  Administered 2012-03-28: 11 via INTRAVENOUS

## 2012-03-28 MED ORDER — ADENOSINE (DIAGNOSTIC) 3 MG/ML IV SOLN
0.5600 mg/kg | Freq: Once | INTRAVENOUS | Status: AC
  Administered 2012-03-28: 59.1 mg via INTRAVENOUS

## 2012-03-28 MED ORDER — REGADENOSON 0.4 MG/5ML IV SOLN
0.4000 mg | Freq: Once | INTRAVENOUS | Status: AC
  Administered 2012-03-28: 0.4 mg via INTRAVENOUS

## 2012-03-28 NOTE — Progress Notes (Signed)
[facility] SITE 3 NUCLEAR MED [address] [place] [address] [phone]  Cardiology Nuclear Med Study  [name] [name] is a 71 y.o. male     MRN : [id number]     DOB: [birth year]  Procedure Date: 03/28/2012  Nuclear Med Background Indication for Stress Test:  Evaluation for Ischemia, Graft Patency and Assess Potential for ICD Placement History:  '98 CABG; '07 Cath:Grafts patent, EF=45%; '10 [id number] scar with mild peri-infarct ischemia, EF=44%; 2/13 Echo:EF=20-25%; Atrial Fibrillation; Cardioversion; Iliac Aneurysm; 2/13 [facility] with chest pain, MI R/O Cardiac Risk Factors: Carotid Disease, History of Smoking, Hypertension, LBBB, Lipids, Overweight and PVD  Symptoms:  Chest Pain with and without Exertion (last episode of chest discomfort:none since discharge 2/13), Diaphoresis, DOE, Nausea and Vomiting   Nuclear Pre-Procedure Caffeine/Decaff Intake:  None NPO After: 6pm   Lungs:  clear O2 Sat: 96% on room air. IV 0.9% NS with Angio Cath:  20g  IV Site: R Antecubital  IV Started by:  Bonnita Levan, RN  Chest Size (in):  46 Cup Size: n/a  Height: 6\' 1"  (1.854 m)  Weight:  233 lb (105.688 kg)  BMI:  Body mass index is 30.74 kg/(m^2). Tech Comments:  N/A    Nuclear Med Study 1 or 2 day study: 1 day  Stress Test Type:  Adenosine  Reading MD: Willa Rough, MD  Order Authorizing Provider:  Olga Millers, MD  Resting Radionuclide: Technetium 48m Tetrofosmin  Resting Radionuclide Dose: 11.0 mCi   Stress Radionuclide:  Technetium 45m Tetrofosmin  Stress Radionuclide Dose: 33.0 mCi           Stress Protocol Rest HR: 53 Stress HR: 55  Rest BP: 108/67 Stress BP: 123/68  Exercise Time (min): n/a METS: n/a   Predicted Max HR: 151 bpm % Max HR: 36.42 bpm Rate Pressure Product: 6765   Dose of Adenosine (mg):  59.3 Dose of Lexiscan: n/a mg  Dose of Atropine (mg): n/a Dose of Dobutamine: n/a mcg/kg/min (at max HR)  Stress Test Technologist: Smiley Houseman, CMA-N  Nuclear Technologist:  Domenic Polite, CNMT     Rest Procedure:  Myocardial perfusion imaging was performed at rest 45 minutes following the intravenous administration of Technetium 58m Tetrofosmin.  Rest ECG: LBBB with intermittent atrial fibrillation.  Stress Procedure:  The patient received IV adenosine at 140 mcg/kg/min for 4 minutes.  There were no significant changes with infusion.  He did c/o chest pain, 4/10, with infusion.  Technetium 77m Tetrofosmin was injected at the 2 minute mark and quantitative spect images were obtained after a 45 minute delay.  Stress ECG: No significant change from baseline ECG  QPS Raw Data Images:  Patient motion noted; appropriate software correction applied. Stress Images:  The stress images reveal a significant lumpy bumpy appearance. This is similar to the appearance of the images in 2010. Careful attention to the stress images reveal no marked abnormalities. There is question of some decreased activity and a small area in the base inferoseptal segment in the mid inferoseptal segment. Rest Images:  The rest images are similar to stress with more of a lumpy bumpy appearance. Subtraction (SDS):  There is no definite ischemia. Transient Ischemic Dilatation (Normal <1.22):  1.07 Lung/Heart Ratio (Normal <0.45):  0.28  Quantitative Gated Spect Images QGS EDV:  NA QGS ESV:  NA  Impression Exercise Capacity:  Adenosine study with no exercise. BP Response:  Normal blood pressure response. Clinical Symptoms:  Shortness of breath ECG Impression:  No  significant ST segment change suggestive of ischemia. Comparison with Prior Nuclear Study: refer to the comments below.  Overall Impression:  This study is difficult to interpret due to the lumpy bumpy appearance. However this is similar to the past. On the prior study there was less activity noted at the base of the inferior wall. Currently there is no definite ischemia. I cannot rule out a  small area of scar in the inferior septum.  LV Ejection Fraction: Study not gated.  LV Wall Motion:  NA  Willa Rough, MD

## 2012-04-15 ENCOUNTER — Encounter: Payer: Self-pay | Admitting: Cardiology

## 2012-04-15 ENCOUNTER — Ambulatory Visit (INDEPENDENT_AMBULATORY_CARE_PROVIDER_SITE_OTHER): Payer: Medicare Other | Admitting: Cardiology

## 2012-04-15 VITALS — BP 112/60 | HR 60 | Ht 73.0 in | Wt 240.0 lb

## 2012-04-15 DIAGNOSIS — I10 Essential (primary) hypertension: Secondary | ICD-10-CM

## 2012-04-15 DIAGNOSIS — I2589 Other forms of chronic ischemic heart disease: Secondary | ICD-10-CM

## 2012-04-15 DIAGNOSIS — I739 Peripheral vascular disease, unspecified: Secondary | ICD-10-CM

## 2012-04-15 DIAGNOSIS — I7103 Dissection of thoracoabdominal aorta: Secondary | ICD-10-CM

## 2012-04-15 DIAGNOSIS — I6529 Occlusion and stenosis of unspecified carotid artery: Secondary | ICD-10-CM

## 2012-04-15 DIAGNOSIS — I2510 Atherosclerotic heart disease of native coronary artery without angina pectoris: Secondary | ICD-10-CM

## 2012-04-15 DIAGNOSIS — I4891 Unspecified atrial fibrillation: Secondary | ICD-10-CM

## 2012-04-15 DIAGNOSIS — E7800 Pure hypercholesterolemia, unspecified: Secondary | ICD-10-CM

## 2012-04-15 DIAGNOSIS — I723 Aneurysm of iliac artery: Secondary | ICD-10-CM

## 2012-04-15 NOTE — Assessment & Plan Note (Signed)
Followed by vascular surgery. 

## 2012-04-15 NOTE — Patient Instructions (Signed)
Your physician recommends that you schedule a follow-up appointment in: 3 MONTHS  Your physician has requested that you have an echocardiogram. Echocardiography is a painless test that uses sound waves to create images of your heart. It provides your doctor with information about the size and shape of your heart and how well your heart's chambers and valves are working. This procedure takes approximately one hour. There are no restrictions for this procedure. SCHEDULE IN EARLY [place]

## 2012-04-15 NOTE — Assessment & Plan Note (Signed)
Continue medical therapy. Followed by vascular surgery.

## 2012-04-15 NOTE — Assessment & Plan Note (Signed)
Continue statin. 

## 2012-04-15 NOTE — Assessment & Plan Note (Signed)
Follow-up vascular surgery. 

## 2012-04-15 NOTE — Assessment & Plan Note (Signed)
Continue statin. Not on aspirin given need for Coumadin. Myoview shows no ischemia.

## 2012-04-15 NOTE — Assessment & Plan Note (Signed)
Blood pressure controlled. Continue present medications. 

## 2012-04-15 NOTE — Progress Notes (Signed)
HPI: Pleasant male for FU of CAD. He has a history of coronary artery disease, status post coronary artery bypassing graft, history of ischemic cardiomyopathy as well as COPD. His last catheterization in October 2007 showed patent grafts. Note his LV function had improved. He also has a history of focal aortic dissection being managed medically. Note, he also has a left iliac artery aneurysm and mild cerebrovascular disease with history of carotid endarterectomy. He is followed by vascular surgery (Dr. [name]) for these issues. Patient recently admitted in February with what was felt to be atypical chest pain. Cardiac enzymes and d-dimer negative. CTA showed no aortic dissection or pulmonary embolus. Echocardiogram showed an ejection fraction of 20-25% which was worse compared to previous; moderate left atrial enlargement. Myoview was repeated in April of 2013. A small prior inferior septal scar could not be excluded but there was no ischemia. Since he was last seen in March of 2013, the patient denies any dyspnea on exertion, orthopnea, PND, pedal edema, palpitations, syncope or chest pain. He does note pain in his thighs bilaterally with walking up a hill.   Current Outpatient Prescriptions  Medication Sig Dispense Refill  . amiodarone (PACERONE) 200 MG tablet Take 200 mg by mouth daily.        [name] amLODipine (NORVASC) 10 MG tablet Take 10 mg by mouth daily.        [name] doxazosin (CARDURA) 8 MG tablet Take 8 mg by mouth at bedtime.      . furosemide (LASIX) 40 MG tablet Take 40 mg by mouth daily.        . hydrALAZINE (APRESOLINE) 50 MG tablet Take 1 tablet (50 mg total) by mouth 3 (three) times daily.  270 tablet  4  . lisinopril (PRINIVIL,ZESTRIL) 40 MG tablet Take 40 mg by mouth daily.        . metoprolol succinate (TOPROL-XL) 25 MG 24 hr tablet Take 1 tablet (25 mg total) by mouth daily.  30 tablet  6  . Multiple Vitamin (MULITIVITAMIN WITH MINERALS) TABS Take 1 tablet by mouth daily.      . niacin  (NIASPAN) 500 MG CR tablet Take 1,000 mg by mouth 2 (two) times daily. 2 tabs po qhs      . nitroGLYCERIN (NITROSTAT) 0.4 MG SL tablet Place 0.4 mg under the tongue every 5 (five) minutes as needed. For chest pain      . potassium chloride SA (KLOR-CON M20) 20 MEQ tablet Take 40 mEq by mouth 3 (three) times daily.        . pravastatin (PRAVACHOL) 80 MG tablet Take 80 mg by mouth daily.        [name] warfarin (COUMADIN) 5 MG tablet Take 5-7.5 mg by mouth daily. Monday and Friday = 7.5 mg.   5 mg all other days         Past Medical History  Diagnosis Date  . Atrial fibrillation     s/p prior DCCV;  Amiodarone Rx.  . CAD (coronary artery disease)     s/p CABG;  cath 10/07:  LM ok, LAD occluded, AV CFX 95%, pRCA occluded; L-LAD, S-OM2/OM3, S-PDA ok  . Cerebrovascular disease     s/p prior Left CEA;  followed by Dr. [name]  . COPD (chronic obstructive pulmonary disease)   . Hyperlipidemia   . Hypertension   . Mitral insufficiency     hx of. not noted on echo 01/2012  . Nephrolithiasis   . Ischemic cardiomyopathy  echo 5/09: EF 55%, mild LAE;  Myoview 11/10: inf and apical scar with mild peri-infarct ischemia, EF 44%. Echo 01/26/12 with EF 20-25% but previously known to be 30% on echoes before last one  . Iliac artery aneurysm, left     followed by Dr. [name]  . Aortic dissection     H/O focal aortic dissection  . LBBB (left bundle branch block)   . CHF (congestive heart failure)   . Myocardial infarction   . Renal insufficiency     01/2012    Past Surgical History  Procedure Date  . Uteroscopy   . Coronary artery bypass graft 1998  . Left cea 07/05  . Appendectomy 09/11/11  . Carotid endarterectomy 2005    left    History   Social History  . Marital Status: Married    Spouse Name: N/A    Number of Children: N/A  . Years of Education: N/A   Occupational History  . RETIRED---truck driver    Social History Main Topics  . Smoking status: Former Smoker -- 1.5 packs/day for 57  years    Types: Cigarettes    Quit date: 12/11/2004  . Smokeless tobacco: Never Used  . Alcohol Use: No  . Drug Use: No  . Sexually Active: Not on file   Other Topics Concern  . Not on file   Social History Narrative   Lives in [place], [place] with wife.     ROS: no fevers or chills, productive cough, hemoptysis, dysphasia, odynophagia, melena, hematochezia, dysuria, hematuria, rash, seizure activity, orthopnea, PND, pedal edema. Remaining systems are negative.  Physical Exam: Well-developed well-nourished in no acute distress.  Skin is warm and dry.  HEENT is normal.  Neck is supple. Chest is clear to auscultation with normal expansion.  Cardiovascular exam is regular rate and rhythm.  Abdominal exam nontender or distended. No masses palpated. Extremities show no edema. neuro grossly intact  Electrocardiogram shows sinus rhythm with PACs and left bundle branch block.

## 2012-04-15 NOTE — Assessment & Plan Note (Signed)
Patient remains in sinus rhythm. Continue amiodarone and Coumadin. 

## 2012-04-15 NOTE — Assessment & Plan Note (Signed)
Recent echocardiogram showed LV function that had worsened. Myoview showed no ischemia. Hydralazine added and blood pressure well controlled. Repeat echocardiogram early June. Patient may need ICD.

## 2012-05-07 ENCOUNTER — Ambulatory Visit (INDEPENDENT_AMBULATORY_CARE_PROVIDER_SITE_OTHER): Payer: Medicare Other | Admitting: Pharmacist

## 2012-05-07 DIAGNOSIS — I4891 Unspecified atrial fibrillation: Secondary | ICD-10-CM

## 2012-05-07 DIAGNOSIS — Z7901 Long term (current) use of anticoagulants: Secondary | ICD-10-CM

## 2012-05-07 LAB — POCT INR: INR: 2.5

## 2012-05-28 ENCOUNTER — Other Ambulatory Visit: Payer: Self-pay | Admitting: *Deleted

## 2012-05-28 MED ORDER — FUROSEMIDE 40 MG PO TABS: 40.0000 mg | ORAL_TABLET | Freq: Every day | ORAL | Status: DC

## 2012-05-29 ENCOUNTER — Encounter: Payer: Self-pay | Admitting: Neurosurgery

## 2012-05-30 ENCOUNTER — Encounter: Payer: Self-pay | Admitting: Neurosurgery

## 2012-05-30 ENCOUNTER — Encounter (INDEPENDENT_AMBULATORY_CARE_PROVIDER_SITE_OTHER): Payer: Medicare Other | Admitting: *Deleted

## 2012-05-30 ENCOUNTER — Other Ambulatory Visit (INDEPENDENT_AMBULATORY_CARE_PROVIDER_SITE_OTHER): Payer: Medicare Other | Admitting: *Deleted

## 2012-05-30 ENCOUNTER — Ambulatory Visit (INDEPENDENT_AMBULATORY_CARE_PROVIDER_SITE_OTHER): Payer: Medicare Other | Admitting: *Deleted

## 2012-05-30 ENCOUNTER — Ambulatory Visit (HOSPITAL_COMMUNITY): Payer: Medicare Other | Attending: Cardiology | Admitting: Radiology

## 2012-05-30 ENCOUNTER — Ambulatory Visit (INDEPENDENT_AMBULATORY_CARE_PROVIDER_SITE_OTHER): Payer: Medicare Other | Admitting: Neurosurgery

## 2012-05-30 VITALS — BP 135/70 | HR 44 | Resp 16 | Ht 73.0 in | Wt 242.0 lb

## 2012-05-30 DIAGNOSIS — I723 Aneurysm of iliac artery: Secondary | ICD-10-CM

## 2012-05-30 DIAGNOSIS — I6529 Occlusion and stenosis of unspecified carotid artery: Secondary | ICD-10-CM

## 2012-05-30 DIAGNOSIS — I509 Heart failure, unspecified: Secondary | ICD-10-CM | POA: Insufficient documentation

## 2012-05-30 DIAGNOSIS — Z7901 Long term (current) use of anticoagulants: Secondary | ICD-10-CM

## 2012-05-30 DIAGNOSIS — Z48812 Encounter for surgical aftercare following surgery on the circulatory system: Secondary | ICD-10-CM

## 2012-05-30 DIAGNOSIS — J4489 Other specified chronic obstructive pulmonary disease: Secondary | ICD-10-CM | POA: Insufficient documentation

## 2012-05-30 DIAGNOSIS — I7103 Dissection of thoracoabdominal aorta: Secondary | ICD-10-CM

## 2012-05-30 DIAGNOSIS — I739 Peripheral vascular disease, unspecified: Secondary | ICD-10-CM

## 2012-05-30 DIAGNOSIS — I2589 Other forms of chronic ischemic heart disease: Secondary | ICD-10-CM | POA: Insufficient documentation

## 2012-05-30 DIAGNOSIS — I714 Abdominal aortic aneurysm, without rupture: Secondary | ICD-10-CM

## 2012-05-30 DIAGNOSIS — I4891 Unspecified atrial fibrillation: Secondary | ICD-10-CM

## 2012-05-30 DIAGNOSIS — I2510 Atherosclerotic heart disease of native coronary artery without angina pectoris: Secondary | ICD-10-CM | POA: Insufficient documentation

## 2012-05-30 DIAGNOSIS — I5020 Unspecified systolic (congestive) heart failure: Secondary | ICD-10-CM | POA: Insufficient documentation

## 2012-05-30 DIAGNOSIS — J449 Chronic obstructive pulmonary disease, unspecified: Secondary | ICD-10-CM | POA: Insufficient documentation

## 2012-05-30 DIAGNOSIS — E7800 Pure hypercholesterolemia, unspecified: Secondary | ICD-10-CM

## 2012-05-30 DIAGNOSIS — Z951 Presence of aortocoronary bypass graft: Secondary | ICD-10-CM | POA: Insufficient documentation

## 2012-05-30 DIAGNOSIS — I10 Essential (primary) hypertension: Secondary | ICD-10-CM | POA: Insufficient documentation

## 2012-05-30 DIAGNOSIS — I252 Old myocardial infarction: Secondary | ICD-10-CM | POA: Insufficient documentation

## 2012-05-30 DIAGNOSIS — I447 Left bundle-branch block, unspecified: Secondary | ICD-10-CM | POA: Insufficient documentation

## 2012-05-30 DIAGNOSIS — E785 Hyperlipidemia, unspecified: Secondary | ICD-10-CM | POA: Insufficient documentation

## 2012-05-30 DIAGNOSIS — Z87891 Personal history of nicotine dependence: Secondary | ICD-10-CM | POA: Insufficient documentation

## 2012-05-30 LAB — POCT INR: INR: 2.2

## 2012-05-30 NOTE — Progress Notes (Signed)
Echocardiogram performed.  

## 2012-05-30 NOTE — Addendum Note (Signed)
Addended by: Sharee Pimple on: 05/30/2012 01:04 PM   Modules accepted: Orders

## 2012-05-30 NOTE — Progress Notes (Signed)
[facility] AAA/Carotid Office Note  CC: Annual AAA duplex for known aneurysm, carotid duplex Referring Physician: Early  History of Present Illness: 71 year old male patient of Dr. [name] followed for known carotid stenosis status post left CEA in 2006. Patient's also got a moderate AAA that is being followed with serial duplex. Patient denies any signs or symptoms of CVA, TIA, amaurosis fugax or any neural deficit, he also denies any abdominal or back pain or new medical diagnoses.  Past Medical History  Diagnosis Date  . Atrial fibrillation     s/p prior DCCV;  Amiodarone Rx.  . CAD (coronary artery disease)     s/p CABG;  cath 10/07:  LM ok, LAD occluded, AV CFX 95%, pRCA occluded; L-LAD, S-OM2/OM3, S-PDA ok  . Cerebrovascular disease     s/p prior Left CEA;  followed by Dr. [name]  . COPD (chronic obstructive pulmonary disease)   . Hyperlipidemia   . Hypertension   . Mitral insufficiency     hx of. not noted on echo 01/2012  . Nephrolithiasis   . Ischemic cardiomyopathy     echo 5/09: EF 55%, mild LAE;  Myoview 11/10: inf and apical scar with mild peri-infarct ischemia, EF 44%. Echo 01/26/12 with EF 20-25% but previously known to be 30% on echoes before last one  . Iliac artery aneurysm, left     followed by Dr. [name]  . Aortic dissection     H/O focal aortic dissection  . LBBB (left bundle branch block)   . CHF (congestive heart failure)   . Myocardial infarction   . Renal insufficiency     01/2012    ROS: [x]  Positive   [ ]  Denies    General: [ ]  Weight loss, [ ]  Fever, [ ]  chills Neurologic: [ ]  Dizziness, [ ]  Blackouts, [ ]  Seizure [ ]  Stroke, [ ]  "Mini stroke", [ ]  Slurred speech, [ ]  Temporary blindness; [ ]  weakness in arms or legs, [ ]  Hoarseness Cardiac: [ ]  Chest pain/pressure, [ ]  Shortness of breath at rest [ ]  Shortness of breath with exertion, [ ]  Atrial fibrillation or irregular heartbeat Vascular: [ ]  Pain in legs with walking, [  ] Pain in legs at rest, [ ]  Pain in legs at night,  [ ]  Non-healing ulcer, [ ]  Blood clot in vein/DVT,   Pulmonary: [ ]  Home oxygen, [ ]  Productive cough, [ ]  Coughing up blood, [ ]  Asthma,  [ ]  Wheezing Musculoskeletal:  [ ]  Arthritis, [ ]  Low back pain, [ ]  Joint pain Hematologic: [ ]  Easy Bruising, [ ]  Anemia; [ ]  Hepatitis Gastrointestinal: [ ]  Blood in stool, [ ]  Gastroesophageal Reflux/heartburn, [ ]  Trouble swallowing Urinary: [ ]  chronic Kidney disease, [ ]  on HD - [ ]  MWF or [ ]  TTHS, [ ]  Burning with urination, [ ]  Difficulty urinating Skin: [ ]  Rashes, [ ]  Wounds Psychological: [ ]  Anxiety, [ ]  Depression   Social History History  Substance Use Topics  . Smoking status: Former Smoker -- 1.5 packs/day for 57 years    Types: Cigarettes    Quit date: 12/11/2004  . Smokeless tobacco: Never Used  . Alcohol Use: No    Family History Family History  Problem Relation Age of Onset  . Stroke Maternal Uncle     No Known Allergies  Current Outpatient Prescriptions  Medication Sig Dispense Refill  . amiodarone (PACERONE) 200 MG tablet Take 200 mg by mouth daily.        [name]  amLODipine (NORVASC) 10 MG tablet Take 10 mg by mouth daily.        [name] doxazosin (CARDURA) 8 MG tablet Take 8 mg by mouth at bedtime.      . furosemide (LASIX) 40 MG tablet Take 1 tablet (40 mg total) by mouth daily.  90 tablet  3  . hydrALAZINE (APRESOLINE) 50 MG tablet Take 1 tablet (50 mg total) by mouth 3 (three) times daily.  270 tablet  4  . lisinopril (PRINIVIL,ZESTRIL) 40 MG tablet Take 40 mg by mouth daily.        . metoprolol succinate (TOPROL-XL) 25 MG 24 hr tablet Take 1 tablet (25 mg total) by mouth daily.  30 tablet  6  . Multiple Vitamin (MULITIVITAMIN WITH MINERALS) TABS Take 1 tablet by mouth daily.      . niacin (NIASPAN) 500 MG CR tablet Take 1,000 mg by mouth 2 (two) times daily. 2 tabs po qhs      . nitroGLYCERIN (NITROSTAT) 0.4 MG SL tablet Place 0.4 mg under the tongue every 5 (five)  minutes as needed. For chest pain      . potassium chloride SA (KLOR-CON M20) 20 MEQ tablet Take 40 mEq by mouth 3 (three) times daily.        . pravastatin (PRAVACHOL) 80 MG tablet Take 80 mg by mouth daily.        [name] warfarin (COUMADIN) 5 MG tablet Take 5-7.5 mg by mouth daily. Monday and Friday = 7.5 mg.   5 mg all other days        Physical Examination  Filed Vitals:   05/30/12 1025  BP: 135/70  Pulse: 44  Resp:     Body mass index is 31.93 kg/(m^2).  General:  WDWN in NAD Gait: Normal HEENT: WNL Eyes: Pupils equal Pulmonary: normal non-labored breathing , without Rales, rhonchi,  wheezing Cardiac: RRR, without  Murmurs, rubs or gallops; Abdomen: soft, NT, no masses Skin: no rashes, ulcers noted  Vascular Exam Pulses: 2+ radial pulses bilaterally, palpable femoral pulses bilaterally, no abdominal mass is palpated Carotid bruits: Carotid pulses to auscultation bilaterally no bruits are heard Extremities without ischemic changes, no Gangrene , no cellulitis; no open wounds;  Musculoskeletal: no muscle wasting or atrophy   Neurologic: A&O X 3; Appropriate Affect ; SENSATION: normal; MOTOR FUNCTION:  moving all extremities equally. Speech is fluent/normal  Non-Invasive Vascular Imaging CAROTID DUPLEX 05/30/2012  Right ICA 20 - 39 % stenosis Left ICA 0 - 19% stenosis AAA duplex shows a maximum diameter of 4.3 distally which is a slight increase from previous exam in 2011  ASSESSMENT/PLAN: Asymptomatic patient with known carotid stenosis and moderate AAA. The patient will followup in one year repeat AAA duplex as well as carotid duplex. His questions were encouraged and answered, he is in agreement with this plan.  Lauree Chandler ANP   Clinic MD: Early

## 2012-06-20 ENCOUNTER — Telehealth: Payer: Self-pay | Admitting: *Deleted

## 2012-06-20 NOTE — Telephone Encounter (Signed)
Dr Shary Decamp and dr [name] spoke regarding the low bp and dizziness the pt is having. Per dr [name] the pt will need to be seen. Spoke with pt, he will see lori gerhardt np Monday at 3:30p

## 2012-06-24 ENCOUNTER — Encounter: Payer: Self-pay | Admitting: Nurse Practitioner

## 2012-06-24 ENCOUNTER — Ambulatory Visit (INDEPENDENT_AMBULATORY_CARE_PROVIDER_SITE_OTHER): Payer: Medicare Other | Admitting: Nurse Practitioner

## 2012-06-24 ENCOUNTER — Ambulatory Visit (INDEPENDENT_AMBULATORY_CARE_PROVIDER_SITE_OTHER): Payer: Medicare Other

## 2012-06-24 VITALS — BP 110/52 | HR 53 | Ht 73.0 in | Wt 242.8 lb

## 2012-06-24 DIAGNOSIS — I4891 Unspecified atrial fibrillation: Secondary | ICD-10-CM

## 2012-06-24 DIAGNOSIS — I5020 Unspecified systolic (congestive) heart failure: Secondary | ICD-10-CM

## 2012-06-24 DIAGNOSIS — Z7901 Long term (current) use of anticoagulants: Secondary | ICD-10-CM

## 2012-06-24 DIAGNOSIS — I498 Other specified cardiac arrhythmias: Secondary | ICD-10-CM

## 2012-06-24 DIAGNOSIS — R001 Bradycardia, unspecified: Secondary | ICD-10-CM

## 2012-06-24 DIAGNOSIS — I2589 Other forms of chronic ischemic heart disease: Secondary | ICD-10-CM

## 2012-06-24 DIAGNOSIS — R42 Dizziness and giddiness: Secondary | ICD-10-CM | POA: Insufficient documentation

## 2012-06-24 DIAGNOSIS — R002 Palpitations: Secondary | ICD-10-CM

## 2012-06-24 MED ORDER — METOPROLOL SUCCINATE ER 25 MG PO TB24: 12.5000 mg | ORAL_TABLET | Freq: Every day | ORAL | Status: DC

## 2012-06-24 MED ORDER — MECLIZINE HCL 25 MG PO TABS: 25.0000 mg | ORAL_TABLET | Freq: Three times a day (TID) | ORAL | Status: AC | PRN

## 2012-06-24 NOTE — Assessment & Plan Note (Signed)
Has not had an INR in 6 weeks. Will check today.

## 2012-06-24 NOTE — Assessment & Plan Note (Signed)
I think he is having vertigo. Will try some Meclizine. I have left him on his other medicines for now.

## 2012-06-24 NOTE — Progress Notes (Signed)
[name] [name] Date of Birth: Mar 22, 1941 Medical Record #[id number]  History of Present Illness: [name] [name] is seen today for a work in visit. He is seen for [name] [name]. He has known CAD, prior CABG and an ischemic CM as well as COPD. His grafts were patent per cath back in 2007. LV function had improved at that time. 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 He has a left iliac artery aneurysm and mild cerebrovascular disease with prior CEA. He is also followed by [name] [name] for these issues. Was admitted in February of this year with atypical chset pain. Echo showed his EF had dropped back to 20 to 25%. Negative Myoview for ischemia done in April. He is on chronic coumadin. He has had a repeat echo that shows an EF of 35%. Wife reports that they were going to be referred to EP for an ICD implant.   He comes in today. He is here with his wife. He has not felt well since week before last. He feels swimmy headed. He feels "like my brain is swishing around when I turn my head". He is a little weak. Has been to the ER and to his primary care. Heart rate and blood pressure noted to be low and he has had several med changes over the past week. Hydralazine was cut from TID to BID. Toprol is down to 12.5 mg per day and he is off the Cardura altogether. The medicine changes haven't really helped. He may feel a little nauseous. No recent URI reported. Still doesn't feel well. No frank syncope. Not really short of breath. No chest pain. No other neuro symptoms.   Current Outpatient Prescriptions on File Prior to Visit  Medication Sig Dispense Refill  . amiodarone (PACERONE) 200 MG tablet Take 200 mg by mouth daily.        . furosemide (LASIX) 40 MG tablet Take 1 tablet (40 mg total) by mouth daily.  90 tablet  3  . lisinopril (PRINIVIL,ZESTRIL) 40 MG tablet Take 40 mg by mouth daily.        . Multiple Vitamin (MULITIVITAMIN WITH MINERALS) TABS Take 1 tablet by mouth  daily.      . niacin (NIASPAN) 500 MG CR tablet Take 1,000 mg by mouth 2 (two) times daily. 2 tabs po qhs      . nitroGLYCERIN (NITROSTAT) 0.4 MG SL tablet Place 0.4 mg under the tongue every 5 (five) minutes as needed. For chest pain      . potassium chloride SA (KLOR-CON M20) 20 MEQ tablet Take 40 mEq by mouth 3 (three) times daily.        . pravastatin (PRAVACHOL) 80 MG tablet Take 80 mg by mouth daily.        [name] warfarin (COUMADIN) 5 MG tablet Take 5-7.5 mg by mouth daily. Monday and Friday = 7.5 mg.   5 mg all other days      . DISCONTD: hydrALAZINE (APRESOLINE) 50 MG tablet Take 1 tablet (50 mg total) by mouth 3 (three) times daily.  270 tablet  4  . DISCONTD: metoprolol succinate (TOPROL-XL) 25 MG 24 hr tablet Take 1 tablet (25 mg total) by mouth daily.  30 tablet  6    No Known Allergies  Past Medical History  Diagnosis Date  . Atrial fibrillation     s/p prior DCCV;  Amiodarone Rx.  . CAD (coronary artery disease)     s/p CABG;  cath 10/07:  LM ok, LAD occluded, AV CFX 95%, pRCA occluded; L-LAD, S-OM2/OM3, S-PDA ok  . Cerebrovascular disease     s/p prior Left CEA;  followed by [name] [name]  . COPD (chronic obstructive pulmonary disease)   . Hyperlipidemia   . Hypertension   . Mitral insufficiency     hx of. not noted on echo 01/2012  . Nephrolithiasis   . Ischemic cardiomyopathy     echo 5/09: EF 55%, mild LAE;  Myoview 11/10: inf and apical scar with mild peri-infarct ischemia, EF 44%. Echo 01/26/12 with EF 20-25% but previously known to be 30% on echoes before last one  . Iliac artery aneurysm, left     followed by [name] [name]  . Aortic dissection     H/O focal aortic dissection  . LBBB (left bundle branch block)   . CHF (congestive heart failure)   . Myocardial infarction   . Renal insufficiency     01/2012    Past Surgical History  Procedure Date  . Uteroscopy   . Coronary artery bypass graft 1998  . Left cea 07/05  . Appendectomy 09/11/11  . Carotid endarterectomy  2005    left    History  Smoking status  . Former Smoker -- 1.5 packs/day for 57 years  . Types: Cigarettes  . Quit date: 12/11/2004  Smokeless tobacco  . Never Used    History  Alcohol Use No    Family History  Problem Relation Age of Onset  . Stroke Maternal Uncle     Review of Systems: The review of systems is per the HPI.  All other systems were reviewed and are negative.  Physical Exam: BP 110/52  Pulse 53  Ht 6\' 1"  (1.854 m)  Wt 242 lb 12.8 oz (110.133 kg)  BMI 32.03 kg/m2 Patient is very pleasant and in no acute distress. Skin is warm and dry. Color is normal.  HEENT is unremarkable. Normocephalic/atraumatic. PERRL. Sclera are nonicteric. Neck is supple. No masses. No JVD. Lungs are clear. Cardiac exam shows a regular rate and rhythm. Abdomen is soft. Extremities are without edema. Gait and ROM are intact. No gross neurologic deficits noted.   LABORATORY DATA: EKG today shows sinus bradycardia, L BBB, rate of 53. QT is 576 with a QTc of 540 and is basically unchanged from his tracing in May.   Echo Study Conclusions June 2013  - Left ventricle: The cavity size was mildly dilated. Wall thickness was normal. The estimated ejection fraction was 35%. Diffuse hypokinesis. Features are consistent with a pseudonormal left ventricular filling pattern, with concomitant abnormal relaxation and increased filling pressure (grade 2 diastolic dysfunction). - Aortic valve: There was no stenosis. - Aorta: Aortic root dimension: 44mm (ED). - Aortic root: The aortic root was dilated. - Ascending aorta: The ascending aorta was normal in size. - Mitral valve: Moderate regurgitation, likely due to annular dilatation. Effective regurgitant orifice: 0.26cm^2 (PISA). - Left atrium: The atrium was moderately dilated. - Right ventricle: The cavity size was normal. Systolic function was mildly reduced. - Right atrium: The atrium was mildly dilated. - Tricuspid valve: Peak RV-RA  gradient: 25mm Hg (S). - Systemic veins: IVC was not visualized. Impressions:  - Mildly dilated LV with moderate global hypokinesis, EF 35%. Normal RV size with mild hypokinesis. Moderate central MR, likely due to annular dilatation (functional  Assessment / Plan:

## 2012-06-24 NOTE — Assessment & Plan Note (Signed)
He is in sinus but remains bradycardic, despite his med changes. Will check 24 hour holter.

## 2012-06-24 NOTE — Patient Instructions (Addendum)
I have sent a prescription for the Metoprolol 25 mg - break in half and only take a half daily  I have also sent in a prescription for Meclizine 25 mg to take up to 3 times a day as needed for dizziness  We are going to put a heart monitor on you for the next 24 hours.  We need to get your coumadin checked today  We will get you an appointment with one of the EP doctors to discuss a defibrillator  Stay on your other medicines for now  Monitor your blood pressure at home  We are going to check your labs today  Call the [facility] office at [phone] if you have any questions, problems or concerns.

## 2012-06-24 NOTE — Assessment & Plan Note (Signed)
He looks fairly compensated to me. Does not appear to be volume overloaded. I think he is having some vertigo. Would like to leave him on his current regimen to help with his heart failure. We will check labs today. He will have a Holter. We will go ahead and refer on to EP for ICD implant consideration. He already has follow up with Dr. [name] in about 3 weeks. Family will monitor his blood pressure at home. Patient is agreeable to this plan and will call if any problems develop in the interim.

## 2012-06-25 LAB — CBC WITH DIFFERENTIAL/PLATELET
Basophils Absolute: 0.1 10*3/uL (ref 0.0–0.1)
Basophils Relative: 0.9 % (ref 0.0–3.0)
Eosinophils Absolute: 0.5 10*3/uL (ref 0.0–0.7)
Eosinophils Relative: 6.9 % — ABNORMAL HIGH (ref 0.0–5.0)
HCT: 42 % (ref 39.0–52.0)
Hemoglobin: 13.9 g/dL (ref 13.0–17.0)
Lymphocytes Relative: 28.9 % (ref 12.0–46.0)
Lymphs Abs: 2.1 10*3/uL (ref 0.7–4.0)
MCHC: 33 g/dL (ref 30.0–36.0)
MCV: 95.2 fl (ref 78.0–100.0)
Monocytes Absolute: 0.5 10*3/uL (ref 0.1–1.0)
Monocytes Relative: 7.3 % (ref 3.0–12.0)
Neutro Abs: 4.1 10*3/uL (ref 1.4–7.7)
Neutrophils Relative %: 56 % (ref 43.0–77.0)
Platelets: 161 10*3/uL (ref 150.0–400.0)
RBC: 4.41 Mil/uL (ref 4.22–5.81)
RDW: 14.7 % — ABNORMAL HIGH (ref 11.5–14.6)
WBC: 7.3 10*3/uL (ref 4.5–10.5)

## 2012-06-25 LAB — TSH: TSH: 0.59 u[IU]/mL (ref 0.35–5.50)

## 2012-06-25 LAB — BASIC METABOLIC PANEL
BUN: 22 mg/dL (ref 6–23)
CO2: 26 mEq/L (ref 19–32)
Calcium: 8.7 mg/dL (ref 8.4–10.5)
Chloride: 109 mEq/L (ref 96–112)
Creatinine, Ser: 1.5 mg/dL (ref 0.4–1.5)
GFR: 49.21 mL/min — ABNORMAL LOW (ref >60.00)
Glucose, Bld: 104 mg/dL — ABNORMAL HIGH (ref 70–99)
Potassium: 4.4 mEq/L (ref 3.5–5.1)
Sodium: 143 mEq/L (ref 135–145)

## 2012-06-26 ENCOUNTER — Telehealth: Payer: Self-pay | Admitting: Cardiology

## 2012-06-26 NOTE — Telephone Encounter (Signed)
Advised patient of lab results  

## 2012-06-26 NOTE — Telephone Encounter (Signed)
Patient returning nurse call he can be reached at [phone]

## 2012-06-26 NOTE — Telephone Encounter (Signed)
Message copied by Burnell Blanks on Wed Jun 26, 2012 12:09 PM ------      Message from: Rosalio Macadamia      Created: Tue Jun 25, 2012  1:00 PM       Ok to report. Labs look ok. Thyroid function was ok.

## 2012-06-28 ENCOUNTER — Ambulatory Visit (INDEPENDENT_AMBULATORY_CARE_PROVIDER_SITE_OTHER): Payer: Medicare Other | Admitting: Internal Medicine

## 2012-06-28 ENCOUNTER — Encounter: Payer: Self-pay | Admitting: Internal Medicine

## 2012-06-28 VITALS — BP 113/68 | HR 56 | Ht 73.0 in | Wt 240.6 lb

## 2012-06-28 DIAGNOSIS — I2589 Other forms of chronic ischemic heart disease: Secondary | ICD-10-CM

## 2012-06-28 DIAGNOSIS — I4891 Unspecified atrial fibrillation: Secondary | ICD-10-CM

## 2012-06-28 DIAGNOSIS — I5022 Chronic systolic (congestive) heart failure: Secondary | ICD-10-CM

## 2012-06-28 NOTE — Assessment & Plan Note (Signed)
As above.

## 2012-06-28 NOTE — Progress Notes (Signed)
HPI  [name] [name] is a 71 y.o. male Seen at the request for Dr. [name] for consideration of an ICD.  He has a history of ischemic heart disease with prior bypass surgery. Catheterization October 2007 demonstrated patent grafts. He was seen in February 2013; echocardiogram at that time demonstrated EF 20-25% and Myoview subsequently to that demonstrated a small inferoseptal scar but no significant ischemia.  He is known to have left bundle branch block and first degree AV block  He has moderate dyspnea on exertion. He is unable to climb a flight of stairs without shortness of breath. He has some PND without orthopnea. He denies peripheral edema.  He has a history of atrial fibrillation for which  he takes amiodarone dating back to 54s. His last cardioversion was a couple years ago He is treated with warfarin. He also takes beta blockers and ACE inhibitors. He is on hydralazine recently added for high blood pressure  without nitrates and is not on Aldactone.  He has peripheral vascular disease with abdominal aortic aneurysm and iliac artery aneurysms.  Past Medical History  Diagnosis Date  . Atrial fibrillation     s/p prior DCCV;  Amiodarone Rx.  . CAD (coronary artery disease)     s/p CABG;  cath 10/07:  LM ok, LAD occluded, AV CFX 95%, pRCA occluded; L-LAD, S-OM2/OM3, S-PDA ok  . Cerebrovascular disease     s/p prior Left CEA;  followed by Dr. [name]  . COPD (chronic obstructive pulmonary disease)   . Hyperlipidemia   . Hypertension   . Mitral insufficiency     hx of. not noted on echo 01/2012  . Nephrolithiasis   . Ischemic cardiomyopathy     echo 5/09: EF 55%, mild LAE;  Myoview 11/10: inf and apical scar with mild peri-infarct ischemia, EF 44%. Echo 01/26/12 with EF 20-25% but previously known to be 30% on echoes before last one  . Iliac artery aneurysm, left     followed by Dr. [name]  . Aortic dissection     H/O focal aortic dissection  . LBBB (left bundle branch  block)   . CHF (congestive heart failure)   . Myocardial infarction   . Renal insufficiency     01/2012    Past Surgical History  Procedure Date  . Uteroscopy   . Coronary artery bypass graft 1998  . Left cea 07/05  . Appendectomy 09/11/11  . Carotid endarterectomy 2005    left    Current Outpatient Prescriptions  Medication Sig Dispense Refill  . amiodarone (PACERONE) 200 MG tablet Take 200 mg by mouth daily.        [name] amLODipine (NORVASC) 10 MG tablet Take 5 mg by mouth daily.      . furosemide (LASIX) 40 MG tablet Take 1 tablet (40 mg total) by mouth daily.  90 tablet  3  . hydrALAZINE (APRESOLINE) 50 MG tablet Take 25 mg by mouth 2 (two) times daily.       [name] lisinopril (PRINIVIL,ZESTRIL) 40 MG tablet Take 40 mg by mouth daily.        . meclizine (ANTIVERT) 25 MG tablet Take 1 tablet (25 mg total) by mouth 3 (three) times daily as needed.  30 tablet  3  . metoprolol succinate (TOPROL-XL) 25 MG 24 hr tablet Take 0.5 tablets (12.5 mg total) by mouth daily.  30 tablet  6  . Multiple Vitamin (MULITIVITAMIN WITH MINERALS) TABS Take 1 tablet by mouth daily.      [name]  nitroGLYCERIN (NITROSTAT) 0.4 MG SL tablet Place 0.4 mg under the tongue every 5 (five) minutes as needed. For chest pain      . potassium chloride SA (KLOR-CON M20) 20 MEQ tablet Take 40 mEq by mouth 3 (three) times daily.        . pravastatin (PRAVACHOL) 80 MG tablet Take 80 mg by mouth daily.        [name] warfarin (COUMADIN) 5 MG tablet Take 5-7.5 mg by mouth daily. Monday and Friday = 7.5 mg.   5 mg all other days      . doxazosin (CARDURA) 8 MG tablet       . niacin (NIASPAN) 500 MG CR tablet Take 1,000 mg by mouth 2 (two) times daily. 2 tabs po qhs        No Known Allergies  Review of Systems negative except from HPI and PMH  Physical Exam BP 113/68  Pulse 56  Ht 6\' 1"  (1.854 m)  Wt 240 lb 9.6 oz (109.135 kg)  BMI 31.74 kg/m2 Well developed and well nourished in no acute distress HENT normal E scleral and icterus  clear Neck Supple JVP 7-8cm ; carotids brisk and full Clear to ausculation regular rate and rhythm  soft S1 widely split S2 without significant murmur  Back without kyphosis or scoliosis  Lymph nodes negative  Soft with active bowel sounds No clubbing cyanosis none Edema Alert and oriented, grossly normal motor and sensory function Skin Warm and Dry  Electrocardiogram from last week 7:15 demonstrated sinus rhythm with left bundle branch block and first degree AV block Intervals 28/17/48 Assessment and  Plan Morning

## 2012-06-28 NOTE — Assessment & Plan Note (Signed)
The patient has a significant ischemic cardiomyopathy with depressed left ventricular function and a nonischemic Myoview. He is 15 years or so status post bypass grafting. His deterioration in LV function is relatively recent and with his congestive symptoms he is appropriately considered for ICD implantation. He also has left bundle branch block is relatively broad 175 ms. He would be appropriate we considered for CRT.  There couple of questions for which I don't have answered yet. The first is might benefit from Aldactone given the above. The second is whether it makes sense to pursue catheterization not withstanding his nonischemic Myoview given the improved sensitivity of the former and the importance revascularization in patients with progressive heart failure and left ventricular dysfunction in terms of survival.  I will review the above with Dr. [name].

## 2012-06-28 NOTE — Procedures (Unsigned)
DUPLEX ULTRASOUND OF ABDOMINAL AORTA  INDICATION:  Abdominal aortic aneurysm.  HISTORY: Diabetes:  No. Cardiac:  CABG. Hypertension:  Yes. Smoking:  Previous. Connective Tissue Disorder:  Yes Family History:  No. Previous Surgery:  Left carotid endarterectomy, 2006.  Prior CT revealed a focal dissection of the infrarenal aorta, 10/08/2009.  DUPLEX EXAM:         AP (cm)                   TRANSVERSE (cm) Proximal             3.0 cm                    3.5 cm Mid                  4.1 cm                    3.6 cm Distal               4.3 cm                    3.9 cm Right Iliac          1.4 cm                    1.43 cm Left Iliac           2.11 cm                   1.89 cm  PREVIOUS:  Date: 11/24/2010  AP:  4.0  TRANSVERSE:  3.8  IMPRESSION: 1. Slight increase in size of the abdominal aortic aneurysm since     previous study on 11/24/2010. 2. Significant plaque visualized in the mid to distal segment of the     aorta with the lumen measuring approximately 1.36 cm. 3. No significant change in size of the left iliac since the previous     study. 4. Note:  This was somewhat of a technically difficult examination due     to the body habitus and overlying bowel gas.   ___________________________________________ [name], M.D.  SS/MEDQ  D:  05/30/2012  T:  05/30/2012  Job:  [id number]

## 2012-06-28 NOTE — Assessment & Plan Note (Signed)
The patient has had long-standing atrial fibrillation dating back about 15 years for which she takes amiodarone. With shortness of breath, I wonder whether makes sense to consider primary function testing and decreasing his long-term amiodarone dose somewhat.

## 2012-06-28 NOTE — Procedures (Unsigned)
CAROTID DUPLEX EXAM  INDICATION:  Follow up carotid artery disease.  HISTORY: Diabetes:  No. Cardiac:  CABG. Hypertension:  Yes. Smoking:  Previous. Previous Surgery:  Left carotid endarterectomy in 2006,History of connective tissue disorder. CV History:  Asymptomatic. Amaurosis Fugax No, Paresthesias No, Hemiparesis No.                                      RIGHT             LEFT Brachial systolic pressure:         160 Brachial Doppler waveforms:         Triphasic Vertebral direction of flow:        Antegrade         Antegrade DUPLEX VELOCITIES (cm/sec) CCA peak systolic                   118               112 ECA peak systolic                   80                76 ICA peak systolic                   74 (mid)          93 ICA end diastolic                   19                27 PLAQUE MORPHOLOGY:                  Heterogenous PLAQUE AMOUNT:                      Mild PLAQUE LOCATION:                    Bifurcation, ICA, ECA  IMPRESSION: 1. 1% to 39% right internal carotid artery stenosis. 2. Patent left carotid endarterectomy site with no evidence of     restenosis. 3. Vertebral artery flow antegrade bilaterally.  ___________________________________________ [name], M.D.  SS/MEDQ  D:  05/30/2012  T:  05/30/2012  Job:  [id number]

## 2012-07-05 ENCOUNTER — Other Ambulatory Visit: Payer: Self-pay | Admitting: *Deleted

## 2012-07-05 MED ORDER — AMIODARONE HCL 200 MG PO TABS: 200.0000 mg | ORAL_TABLET | Freq: Every day | ORAL | Status: DC

## 2012-07-05 MED ORDER — LISINOPRIL 40 MG PO TABS: 40.0000 mg | ORAL_TABLET | Freq: Every day | ORAL | Status: DC

## 2012-07-05 MED ORDER — PRAVASTATIN SODIUM 80 MG PO TABS: 80.0000 mg | ORAL_TABLET | Freq: Every day | ORAL | Status: DC

## 2012-07-05 MED ORDER — AMLODIPINE BESYLATE 10 MG PO TABS: 5.0000 mg | ORAL_TABLET | Freq: Every day | ORAL | Status: DC

## 2012-07-16 ENCOUNTER — Encounter: Payer: Self-pay | Admitting: *Deleted

## 2012-07-16 ENCOUNTER — Ambulatory Visit (INDEPENDENT_AMBULATORY_CARE_PROVIDER_SITE_OTHER)
Admission: RE | Admit: 2012-07-16 | Discharge: 2012-07-16 | Disposition: A | Discharge status: Home / Self Care | Payer: Medicare Other | Source: Ambulatory Visit | Attending: Cardiology | Admitting: Cardiology

## 2012-07-16 ENCOUNTER — Ambulatory Visit (INDEPENDENT_AMBULATORY_CARE_PROVIDER_SITE_OTHER): Payer: Medicare Other | Admitting: Cardiology

## 2012-07-16 ENCOUNTER — Encounter: Payer: Self-pay | Admitting: Cardiology

## 2012-07-16 ENCOUNTER — Other Ambulatory Visit: Payer: Self-pay | Admitting: Cardiology

## 2012-07-16 ENCOUNTER — Ambulatory Visit (INDEPENDENT_AMBULATORY_CARE_PROVIDER_SITE_OTHER): Payer: Medicare Other | Admitting: *Deleted

## 2012-07-16 VITALS — BP 126/76 | HR 57 | Ht 73.0 in | Wt 241.8 lb

## 2012-07-16 DIAGNOSIS — I723 Aneurysm of iliac artery: Secondary | ICD-10-CM

## 2012-07-16 DIAGNOSIS — Z01810 Encounter for preprocedural cardiovascular examination: Secondary | ICD-10-CM

## 2012-07-16 DIAGNOSIS — I10 Essential (primary) hypertension: Secondary | ICD-10-CM

## 2012-07-16 DIAGNOSIS — I4891 Unspecified atrial fibrillation: Secondary | ICD-10-CM

## 2012-07-16 DIAGNOSIS — E7800 Pure hypercholesterolemia, unspecified: Secondary | ICD-10-CM

## 2012-07-16 DIAGNOSIS — I2589 Other forms of chronic ischemic heart disease: Secondary | ICD-10-CM

## 2012-07-16 DIAGNOSIS — I714 Abdominal aortic aneurysm, without rupture: Secondary | ICD-10-CM

## 2012-07-16 DIAGNOSIS — I2510 Atherosclerotic heart disease of native coronary artery without angina pectoris: Secondary | ICD-10-CM

## 2012-07-16 DIAGNOSIS — Z7901 Long term (current) use of anticoagulants: Secondary | ICD-10-CM

## 2012-07-16 LAB — CBC WITH DIFFERENTIAL/PLATELET
Eosinophils Absolute: 0.5 10*3/uL (ref 0.0–0.7)
Eosinophils Relative: 6.7 % — ABNORMAL HIGH (ref 0.0–5.0)
HCT: 43.9 % (ref 39.0–52.0)
Lymphs Abs: 2 10*3/uL (ref 0.7–4.0)
MCHC: 32.7 g/dL (ref 30.0–36.0)
MCV: 95.4 fl (ref 78.0–100.0)
Monocytes Absolute: 0.8 10*3/uL (ref 0.1–1.0)
Neutrophils Relative %: 56.6 % (ref 43.0–77.0)
Platelets: 175 10*3/uL (ref 150.0–400.0)
WBC: 7.8 10*3/uL (ref 4.5–10.5)

## 2012-07-16 LAB — HEPATIC FUNCTION PANEL
ALT: 19 U/L (ref 0–53)
Bilirubin, Direct: 0.1 mg/dL (ref 0.0–0.3)
Total Bilirubin: 1 mg/dL (ref 0.3–1.2)
Total Protein: 6.8 g/dL (ref 6.0–8.3)

## 2012-07-16 LAB — TSH: TSH: 0.59 u[IU]/mL (ref 0.35–5.50)

## 2012-07-16 LAB — BASIC METABOLIC PANEL
BUN: 16 mg/dL (ref 6–23)
CO2: 28 mEq/L (ref 19–32)
Chloride: 106 mEq/L (ref 96–112)
Creatinine, Ser: 1.2 mg/dL (ref 0.4–1.5)
Glucose, Bld: 91 mg/dL (ref 70–99)
Potassium: 3.3 mEq/L — ABNORMAL LOW (ref 3.5–5.1)

## 2012-07-16 LAB — LIPID PANEL
Cholesterol: 129 mg/dL (ref 0–200)
Triglycerides: 148 mg/dL (ref 0.0–149.0)
VLDL: 29.6 mg/dL (ref 0.0–40.0)

## 2012-07-16 NOTE — Assessment & Plan Note (Signed)
Followed by vascular surgery. 

## 2012-07-16 NOTE — Assessment & Plan Note (Signed)
Blood pressure controlled. Continue present medications. 

## 2012-07-16 NOTE — Assessment & Plan Note (Signed)
Continue statin. Not on aspirin given need for Coumadin. 

## 2012-07-16 NOTE — Progress Notes (Signed)
 HPI: Pleasant male for FU of CAD. He has a history of coronary artery disease, status post coronary artery bypassing graft, history of ischemic cardiomyopathy as well as COPD. His last catheterization in October 2007 showed patent grafts. Note his LV function had improved. He also has a history of focal aortic dissection being managed medically. Note, he also has a left iliac artery aneurysm and mild cerebrovascular disease with history of carotid endarterectomy. He is followed by vascular surgery (Dr. Early) for these issues. CTA Feb 2013 showed no aortic dissection or pulmonary embolus. Echocardiogram showed an ejection fraction of 20-25% which was worse compared to previous; moderate left atrial enlargement. Myoview was repeated in April of 2013. A small prior inferior septal scar could not be excluded but there was no ischemia. Repeat echo in June of 2013 showed EF 35, mildly dilated aortic root, moderate MR, moderate LAE, mild RAE. Holter monitor in July of 2013 showed sinus bradycardia with PACs and PVCs. Patient seen by Dr Klein for consideration of ICD and cath recommended prior to procedure. Since I last saw him, he has dyspnea occasionally but unchanged. No orthopnea, PND, pedal edema, syncope or chest pain. He continues to have dizziness which is unchanged with position and continuous.   Current Outpatient Prescriptions  Medication Sig Dispense Refill  . amiodarone (PACERONE) 200 MG tablet Take 1 tablet (200 mg total) by mouth daily.  90 tablet  4  . amLODipine (NORVASC) 10 MG tablet Take 0.5 tablets (5 mg total) by mouth daily.  90 tablet  4  . furosemide (LASIX) 40 MG tablet Take 1 tablet (40 mg total) by mouth daily.  90 tablet  3  . hydrALAZINE (APRESOLINE) 50 MG tablet Take 25 mg by mouth 2 (two) times daily.       . lisinopril (PRINIVIL,ZESTRIL) 40 MG tablet Take 1 tablet (40 mg total) by mouth daily.  90 tablet  4  . metoprolol succinate (TOPROL-XL) 25 MG 24 hr tablet Take 0.5 tablets  (12.5 mg total) by mouth daily.  30 tablet  6  . Multiple Vitamin (MULITIVITAMIN WITH MINERALS) TABS Take 1 tablet by mouth daily.      . niacin (NIASPAN) 500 MG CR tablet Take 1,000 mg by mouth 2 (two) times daily. 2 tabs po qhs      . nitroGLYCERIN (NITROSTAT) 0.4 MG SL tablet Place 0.4 mg under the tongue every 5 (five) minutes as needed. For chest pain      . potassium chloride SA (KLOR-CON M20) 20 MEQ tablet Take 40 mEq by mouth 3 (three) times daily.        . pravastatin (PRAVACHOL) 80 MG tablet Take 1 tablet (80 mg total) by mouth daily.  90 tablet  4  . warfarin (COUMADIN) 5 MG tablet Take 5-7.5 mg by mouth daily. Monday and Friday = 7.5 mg.   5 mg all other days         Past Medical History  Diagnosis Date  . Atrial fibrillation     s/p prior DCCV;  Amiodarone Rx.  . CAD (coronary artery disease)     s/p CABG;  cath 10/07:  LM ok, LAD occluded, AV CFX 95%, pRCA occluded; L-LAD, S-OM2/OM3, S-PDA ok  . Cerebrovascular disease     s/p prior Left CEA;  followed by Dr. Early  . COPD (chronic obstructive pulmonary disease)   . Hyperlipidemia   . Hypertension   . Mitral insufficiency     hx of. not noted on echo   01/2012  . Nephrolithiasis   . Ischemic cardiomyopathy     echo 5/09: EF 55%, mild LAE;  Myoview 11/10: inf and apical scar with mild peri-infarct ischemia, EF 44%. Echo 01/26/12 with EF 20-25% but previously known to be 30% on echoes before last one  . Iliac artery aneurysm, left     followed by Dr. Early  . Aortic dissection     H/O focal aortic dissection  . LBBB (left bundle branch block)   . CHF (congestive heart failure)   . Myocardial infarction   . Renal insufficiency     01/2012    Past Surgical History  Procedure Date  . Uteroscopy   . Coronary artery bypass graft 1998  . Left cea 07/05  . Appendectomy 09/11/11  . Carotid endarterectomy 2005    left    History   Social History  . Marital Status: Married    Spouse Name: N/A    Number of Children:  N/A  . Years of Education: N/A   Occupational History  . RETIRED---truck driver    Social History Main Topics  . Smoking status: Former Smoker -- 1.5 packs/day for 57 years    Types: Cigarettes    Quit date: 12/11/2004  . Smokeless tobacco: Never Used  . Alcohol Use: No  . Drug Use: No  . Sexually Active: Not on file   Other Topics Concern  . Not on file   Social History Narrative   Lives in [place], [place] with wife.     ROS: dizziness but no fevers or chills, productive cough, hemoptysis, dysphasia, odynophagia, melena, hematochezia, dysuria, hematuria, rash, seizure activity, orthopnea, PND, pedal edema, claudication. Remaining systems are negative.  Physical Exam: Well-developed well-nourished in no acute distress.  Skin is warm and dry.  HEENT is normal.  Neck is supple.  Chest is clear to auscultation with normal expansion.  Cardiovascular exam is regular rate and rhythm.  Abdominal exam nontender or distended. No masses palpated. Extremities show no edema. neuro grossly intact       

## 2012-07-16 NOTE — Assessment & Plan Note (Addendum)
Patient's LV function remains reduced. He has seen Dr. Graciela Husbands in ICD recommended but cardiac catheterization felt indicated prior to the procedure to exclude progressive coronary disease as a cause of his cardiomyopathy. Plan to proceed with outpatient catheterization. The risks and benefits were discussed and the patient agrees to proceed. We will need to try a radial approach given his history of focal aortic dissection, aortic aneurysm and iliac aneurysms. Continue present medications. Hold Coumadin prior to procedure and resume afterwards.

## 2012-07-16 NOTE — Patient Instructions (Addendum)
Your physician recommends that you schedule a follow-up appointment in: 3 MONTHS WITH DR [name]   Your physician recommends that you HAVE LAB WORK TODAY   A chest x-ray takes a picture of the organs and structures inside the chest, including the heart, lungs, and blood vessels. This test can show several things, including, whether the heart is enlarges; whether fluid is building up in the lungs; and whether pacemaker / defibrillator leads are still in place. AT [address]

## 2012-07-16 NOTE — Assessment & Plan Note (Signed)
Continued amiodarone. Check liver functions, TSH and chest x-ray. Continue Coumadin.

## 2012-07-16 NOTE — Assessment & Plan Note (Addendum)
Continue statin. Check lipids and liver. 

## 2012-07-19 ENCOUNTER — Encounter (HOSPITAL_BASED_OUTPATIENT_CLINIC_OR_DEPARTMENT_OTHER)
Admission: RE | Disposition: A | Discharge status: Home / Self Care | Payer: Self-pay | Source: Ambulatory Visit | Attending: Cardiology

## 2012-07-19 ENCOUNTER — Inpatient Hospital Stay (HOSPITAL_BASED_OUTPATIENT_CLINIC_OR_DEPARTMENT_OTHER)
Admission: RE | Admit: 2012-07-19 | Discharge: 2012-07-19 | Disposition: A | Discharge status: Home / Self Care | Payer: Medicare Other | Source: Ambulatory Visit | Attending: Cardiology | Admitting: Cardiology

## 2012-07-19 DIAGNOSIS — I2510 Atherosclerotic heart disease of native coronary artery without angina pectoris: Secondary | ICD-10-CM

## 2012-07-19 DIAGNOSIS — J4489 Other specified chronic obstructive pulmonary disease: Secondary | ICD-10-CM | POA: Insufficient documentation

## 2012-07-19 DIAGNOSIS — R42 Dizziness and giddiness: Secondary | ICD-10-CM | POA: Insufficient documentation

## 2012-07-19 DIAGNOSIS — I509 Heart failure, unspecified: Secondary | ICD-10-CM | POA: Insufficient documentation

## 2012-07-19 DIAGNOSIS — E785 Hyperlipidemia, unspecified: Secondary | ICD-10-CM | POA: Insufficient documentation

## 2012-07-19 DIAGNOSIS — I10 Essential (primary) hypertension: Secondary | ICD-10-CM | POA: Insufficient documentation

## 2012-07-19 DIAGNOSIS — I2589 Other forms of chronic ischemic heart disease: Secondary | ICD-10-CM

## 2012-07-19 DIAGNOSIS — I723 Aneurysm of iliac artery: Secondary | ICD-10-CM | POA: Insufficient documentation

## 2012-07-19 DIAGNOSIS — J449 Chronic obstructive pulmonary disease, unspecified: Secondary | ICD-10-CM | POA: Insufficient documentation

## 2012-07-19 SURGERY — JV LEFT HEART CATHETERIZATION WITH CORONARY ANGIOGRAM
Anesthesia: Moderate Sedation

## 2012-07-19 MED ORDER — ASPIRIN 81 MG PO CHEW
324.0000 mg | CHEWABLE_TABLET | ORAL | Status: AC
  Administered 2012-07-19: 324 mg via ORAL

## 2012-07-19 MED ORDER — DIAZEPAM 2 MG PO TABS
2.0000 mg | ORAL_TABLET | ORAL | Status: AC
  Administered 2012-07-19: 2 mg via ORAL

## 2012-07-19 MED ORDER — SODIUM CHLORIDE 0.9 % IV SOLN
1.0000 mL/kg/h | INTRAVENOUS | Status: DC
  Administered 2012-07-19: 1 mL/kg/h via INTRAVENOUS

## 2012-07-19 MED ORDER — SODIUM CHLORIDE 0.9 % IV SOLN: 250.0000 mL | INTRAVENOUS | Status: DC | PRN

## 2012-07-19 MED ORDER — SODIUM CHLORIDE 0.9 % IJ SOLN: 3.0000 mL | INTRAMUSCULAR | Status: DC | PRN

## 2012-07-19 MED ORDER — SODIUM CHLORIDE 0.9 % IJ SOLN: 3.0000 mL | Freq: Two times a day (BID) | INTRAMUSCULAR | Status: DC

## 2012-07-19 NOTE — Interval H&P Note (Signed)
History and Physical Interval Note:  07/19/2012 9:44 AM  [name] [name]  has presented today for surgery, with the diagnosis of cp  The various methods of treatment have been discussed with the patient and family. After consideration of risks, benefits and other options for treatment, the patient has consented to  Procedure(s) (LRB): JV LEFT HEART CATHETERIZATION WITH CORONARY ANGIOGRAM (N/A) as a surgical intervention .  The patient's history has been reviewed, patient examined, no change in status, stable for surgery.  I have reviewed the patient's chart and labs.  Questions were answered to the patient's satisfaction.     Rollene Rotunda

## 2012-07-19 NOTE — CV Procedure (Signed)
  Cardiac Catheterization Procedure Note  Name: [name] [name] MRN: [id number] DOB: Dec 15, 1940  Procedure: Left Heart Cath, Selective Coronary Angiography, LV angiography  Indication:  Ischemic cardiomyopathy, known CAD and CABG  Procedural details: The left radial was prepped, draped, and anesthetized with 1% lidocaine. Using modified Seldinger technique, a 5French sheath was introduced into the right femoral artery. Standard Judkins catheters were used for coronary angiography and left ventriculography. Catheter exchanges were performed over a guidewire. There were no immediate procedural complications. The patient was transferred to the post catheterization recovery area for further monitoring.  Procedural Findings:   Hemodynamics:     AO 156/24    LV 153/93   Coronary angiography:   Coronary dominance: Right  Left mainstem:   LM normal.    Left anterior descending (LAD):   100 proximal.  LAD fills via LIMA.  No high grade distal disease.  D1 moderated sized and fills via SVG.  Left circumflex (LCx):  AV groove diffuse luminal irregularities.  Mid 25%.  Small OM2 with ostial 95%.  MOM with subtotal stenosis.  There is filling of two moderate sized OMs with SVG.  Both of these vessels are occluded proximally but demonstrated no high grade disease after the graft anastomosis.    Right coronary artery (RCA):  Occluded proximally.  PDA is moderate sized and fills via SVG.  Two moderate sized PLs backfill via this graft and are free if disease.  Grafts:    LIMA:  Patent to the LAD.  SVG to Diag:  Patent with proximal circumferential 50% proximal stenosis and mild luminal irregularities.  SVG to OM x 2:  Patent with mild luminal irregularities.  SVG to RCA:  Mild luminal irregularities with mid 40% stenosis.  Left ventriculography: Left ventricular systolic function is reduced, LVEF is estimated at 35%, there is no significant mitral regurgitation.    Final Conclusions:   Ischemic cardiomyopathy with patent bypass grafts as above.    Recommendations: Follow with Drs [name] and Graciela Husbands for further evaluation and consideration for ICD.  Rollene Rotunda 07/19/2012, 10:56 AM

## 2012-07-19 NOTE — H&P (View-Only) (Signed)
HPI: Pleasant male for FU of CAD. He has a history of coronary artery disease, status post coronary artery bypassing graft, history of ischemic cardiomyopathy as well as COPD. His last catheterization in October 2007 showed patent grafts. Note his LV function had improved. He also has a history of focal aortic dissection being managed medically. Note, he also has a left iliac artery aneurysm and mild cerebrovascular disease with history of carotid endarterectomy. He is followed by vascular surgery (Dr. [name]) for these issues. CTA Feb 2013 showed no aortic dissection or pulmonary embolus. Echocardiogram showed an ejection fraction of 20-25% which was worse compared to previous; moderate left atrial enlargement. Myoview was repeated in April of 2013. A small prior inferior septal scar could not be excluded but there was no ischemia. Repeat echo in June of 2013 showed EF 35, mildly dilated aortic root, moderate MR, moderate LAE, mild RAE. Holter monitor in July of 2013 showed sinus bradycardia with PACs and PVCs. Patient seen by Dr Graciela Husbands for consideration of ICD and cath recommended prior to procedure. Since I last saw him, he has dyspnea occasionally but unchanged. No orthopnea, PND, pedal edema, syncope or chest pain. He continues to have dizziness which is unchanged with position and continuous.   Current Outpatient Prescriptions  Medication Sig Dispense Refill  . amiodarone (PACERONE) 200 MG tablet Take 1 tablet (200 mg total) by mouth daily.  90 tablet  4  . amLODipine (NORVASC) 10 MG tablet Take 0.5 tablets (5 mg total) by mouth daily.  90 tablet  4  . furosemide (LASIX) 40 MG tablet Take 1 tablet (40 mg total) by mouth daily.  90 tablet  3  . hydrALAZINE (APRESOLINE) 50 MG tablet Take 25 mg by mouth 2 (two) times daily.       [name] lisinopril (PRINIVIL,ZESTRIL) 40 MG tablet Take 1 tablet (40 mg total) by mouth daily.  90 tablet  4  . metoprolol succinate (TOPROL-XL) 25 MG 24 hr tablet Take 0.5 tablets  (12.5 mg total) by mouth daily.  30 tablet  6  . Multiple Vitamin (MULITIVITAMIN WITH MINERALS) TABS Take 1 tablet by mouth daily.      . niacin (NIASPAN) 500 MG CR tablet Take 1,000 mg by mouth 2 (two) times daily. 2 tabs po qhs      . nitroGLYCERIN (NITROSTAT) 0.4 MG SL tablet Place 0.4 mg under the tongue every 5 (five) minutes as needed. For chest pain      . potassium chloride SA (KLOR-CON M20) 20 MEQ tablet Take 40 mEq by mouth 3 (three) times daily.        . pravastatin (PRAVACHOL) 80 MG tablet Take 1 tablet (80 mg total) by mouth daily.  90 tablet  4  . warfarin (COUMADIN) 5 MG tablet Take 5-7.5 mg by mouth daily. Monday and Friday = 7.5 mg.   5 mg all other days         Past Medical History  Diagnosis Date  . Atrial fibrillation     s/p prior DCCV;  Amiodarone Rx.  . CAD (coronary artery disease)     s/p CABG;  cath 10/07:  LM ok, LAD occluded, AV CFX 95%, pRCA occluded; L-LAD, S-OM2/OM3, S-PDA ok  . Cerebrovascular disease     s/p prior Left CEA;  followed by Dr. [name]  . COPD (chronic obstructive pulmonary disease)   . Hyperlipidemia   . Hypertension   . Mitral insufficiency     hx of. not noted on echo  01/2012  . Nephrolithiasis   . Ischemic cardiomyopathy     echo 5/09: EF 55%, mild LAE;  Myoview 11/10: inf and apical scar with mild peri-infarct ischemia, EF 44%. Echo 01/26/12 with EF 20-25% but previously known to be 30% on echoes before last one  . Iliac artery aneurysm, left     followed by Dr. [name]  . Aortic dissection     H/O focal aortic dissection  . LBBB (left bundle branch block)   . CHF (congestive heart failure)   . Myocardial infarction   . Renal insufficiency     01/2012    Past Surgical History  Procedure Date  . Uteroscopy   . Coronary artery bypass graft 1998  . Left cea 07/05  . Appendectomy 09/11/11  . Carotid endarterectomy 2005    left    History   Social History  . Marital Status: Married    Spouse Name: N/A    Number of Children:  N/A  . Years of Education: N/A   Occupational History  . RETIRED---truck driver    Social History Main Topics  . Smoking status: Former Smoker -- 1.5 packs/day for 57 years    Types: Cigarettes    Quit date: 12/11/2004  . Smokeless tobacco: Never Used  . Alcohol Use: No  . Drug Use: No  . Sexually Active: Not on file   Other Topics Concern  . Not on file   Social History Narrative   Lives in [place], [place] with wife.     ROS: dizziness but no fevers or chills, productive cough, hemoptysis, dysphasia, odynophagia, melena, hematochezia, dysuria, hematuria, rash, seizure activity, orthopnea, PND, pedal edema, claudication. Remaining systems are negative.  Physical Exam: Well-developed well-nourished in no acute distress.  Skin is warm and dry.  HEENT is normal.  Neck is supple.  Chest is clear to auscultation with normal expansion.  Cardiovascular exam is regular rate and rhythm.  Abdominal exam nontender or distended. No masses palpated. Extremities show no edema. neuro grossly intact

## 2012-07-23 ENCOUNTER — Telehealth: Payer: Self-pay | Admitting: Cardiology

## 2012-07-23 NOTE — Telephone Encounter (Signed)
Pt calling re when to start back on his coumadin

## 2012-07-23 NOTE — Telephone Encounter (Signed)
Patient's wife states patient has not taken his coumadin since this past Thursday 07/18/12 . Pt did not restart medication  Back, because he was waiting to be scheduled for ICD placement with DR. Graciela Husbands AND DID NOT WANT TO start and stop coumadin again. According to Dr. Ludwig Clarks O/V note, patient was to restart the coumadin Friday 07/19/12 after the cardiac cath. Pt and wife aware to restart coumadin today. Pt is schedule to have INR check in the coumadin clinic on 07/30/12.

## 2012-07-24 ENCOUNTER — Telehealth: Payer: Self-pay | Admitting: Internal Medicine

## 2012-07-24 NOTE — Telephone Encounter (Signed)
Pt calling re cath done and went well, however dr Graciela Husbands mentioned getting a defib device, but hasn't heard back re this, pls call

## 2012-07-24 NOTE — Telephone Encounter (Signed)
Spoke to patient's wife she stated she was calling to find out when patient is to have defib inserted.Wife was told Dr.Klein's nurse not in office today.Will sent message to her and she will call back this week.

## 2012-07-25 NOTE — Telephone Encounter (Signed)
Will forward to MD for review. Do you want me to go ahead and set him up for implant?

## 2012-07-29 NOTE — Telephone Encounter (Signed)
Yes but will need preop  Ask LG if shew ould be willing to help me with this thanks steve

## 2012-07-30 ENCOUNTER — Encounter: Payer: Self-pay | Admitting: *Deleted

## 2012-07-30 ENCOUNTER — Ambulatory Visit (INDEPENDENT_AMBULATORY_CARE_PROVIDER_SITE_OTHER): Payer: Medicare Other | Admitting: *Deleted

## 2012-07-30 ENCOUNTER — Ambulatory Visit (INDEPENDENT_AMBULATORY_CARE_PROVIDER_SITE_OTHER): Payer: Medicare Other | Admitting: Internal Medicine

## 2012-07-30 ENCOUNTER — Encounter: Payer: Self-pay | Admitting: Internal Medicine

## 2012-07-30 VITALS — BP 150/71 | HR 51 | Ht 73.0 in | Wt 247.8 lb

## 2012-07-30 DIAGNOSIS — I5022 Chronic systolic (congestive) heart failure: Secondary | ICD-10-CM

## 2012-07-30 DIAGNOSIS — Z7901 Long term (current) use of anticoagulants: Secondary | ICD-10-CM

## 2012-07-30 DIAGNOSIS — I2589 Other forms of chronic ischemic heart disease: Secondary | ICD-10-CM

## 2012-07-30 DIAGNOSIS — Z01810 Encounter for preprocedural cardiovascular examination: Secondary | ICD-10-CM

## 2012-07-30 DIAGNOSIS — I4891 Unspecified atrial fibrillation: Secondary | ICD-10-CM

## 2012-07-30 DIAGNOSIS — I255 Ischemic cardiomyopathy: Secondary | ICD-10-CM

## 2012-07-30 DIAGNOSIS — I2510 Atherosclerotic heart disease of native coronary artery without angina pectoris: Secondary | ICD-10-CM

## 2012-07-30 NOTE — Telephone Encounter (Signed)
Per [name] [name], the patient spoke with her in the office this morning. She said she would call him. Message forwarded to [facility].

## 2012-07-30 NOTE — Patient Instructions (Addendum)

## 2012-07-31 ENCOUNTER — Other Ambulatory Visit: Payer: Self-pay | Admitting: *Deleted

## 2012-07-31 DIAGNOSIS — I255 Ischemic cardiomyopathy: Secondary | ICD-10-CM

## 2012-08-03 ENCOUNTER — Encounter: Payer: Self-pay | Admitting: Internal Medicine

## 2012-08-03 NOTE — Progress Notes (Signed)
 Primary Care Physician: GRISSO,GREG, MD Referring Physician:  Dr Crenshaw   [name] [name] is a 70 y.o. male with a h/o CAD, ischemic CM (EF 35%), and LBBB who presents for EP follow-up.  He is well known to Dr Crenshaw.  He is s/p CABG 10/07.  He has been treated with an optimal medical regimen but continues to have progressive worsening of his EF.  He was referred to Dr Klein for evaluation for ICD implantation.  It was felt that he needed a cath.  He therefore underwent left heart catheterization which revealed patent bypass grafts there were no targets for revascularization.  The patient therefore returns for further consideration of ICD implantation. He appears to be doing well.  He remains active but reports SOB with moderate activity.   Today, he denies symptoms of palpitations, chest pain, orthopnea, PND, lower extremity edema, dizziness, presyncope, syncope, or neurologic sequela. The patient is tolerating medications without difficulties and is otherwise without complaint today.   Past Medical History  Diagnosis Date  . Atrial fibrillation     s/p prior DCCV;  Amiodarone Rx.  . CAD (coronary artery disease)     s/p CABG;  cath 10/07:  LM ok, LAD occluded, AV CFX 95%, pRCA occluded; L-LAD, S-OM2/OM3, S-PDA ok  . Cerebrovascular disease     s/p prior Left CEA;  followed by Dr. Early  . COPD (chronic obstructive pulmonary disease)   . Hyperlipidemia   . Hypertension   . Mitral insufficiency     hx of. not noted on echo 01/2012  . Nephrolithiasis   . Ischemic cardiomyopathy     echo 5/09: EF 55%, mild LAE;  Myoview 11/10: inf and apical scar with mild peri-infarct ischemia, EF 44%. Echo 01/26/12 with EF 20-25% but previously known to be 30% on echoes before last one  . Iliac artery aneurysm, left     followed by Dr. Early  . Aortic dissection     H/O focal aortic dissection  . LBBB (left bundle branch block)   . CHF (congestive heart failure)   . Myocardial infarction   .  Renal insufficiency     01/2012   Past Surgical History  Procedure Date  . Uteroscopy   . Coronary artery bypass graft 1998  . Left cea 07/05  . Appendectomy 09/11/11  . Carotid endarterectomy 2005    left    Current Outpatient Prescriptions  Medication Sig Dispense Refill  . amiodarone (PACERONE) 200 MG tablet Take 1 tablet (200 mg total) by mouth daily.  90 tablet  4  . amLODipine (NORVASC) 10 MG tablet Take 0.5 tablets (5 mg total) by mouth daily.  90 tablet  4  . furosemide (LASIX) 40 MG tablet Take 1 tablet (40 mg total) by mouth daily.  90 tablet  3  . hydrALAZINE (APRESOLINE) 50 MG tablet Take 25 mg by mouth 2 (two) times daily.       . lisinopril (PRINIVIL,ZESTRIL) 40 MG tablet Take 1 tablet (40 mg total) by mouth daily.  90 tablet  4  . metoprolol succinate (TOPROL-XL) 25 MG 24 hr tablet Take 0.5 tablets (12.5 mg total) by mouth daily.  30 tablet  6  . Multiple Vitamin (MULITIVITAMIN WITH MINERALS) TABS Take 1 tablet by mouth daily.      . niacin (NIASPAN) 500 MG CR tablet Take 1,000 mg by mouth 2 (two) times daily. 2 tabs po qhs      . nitroGLYCERIN (NITROSTAT) 0.4 MG SL tablet Place   0.4 mg under the tongue every 5 (five) minutes as needed. For chest pain      . potassium chloride SA (KLOR-CON M20) 20 MEQ tablet Take 40 mEq by mouth 3 (three) times daily.        . pravastatin (PRAVACHOL) 80 MG tablet Take 1 tablet (80 mg total) by mouth daily.  90 tablet  4  . warfarin (COUMADIN) 5 MG tablet Take 5-7.5 mg by mouth daily. Monday and Friday = 7.5 mg.   5 mg all other days        No Known Allergies  History   Social History  . Marital Status: Married    Spouse Name: N/A    Number of Children: N/A  . Years of Education: N/A   Occupational History  . RETIRED---truck driver    Social History Main Topics  . Smoking status: Former Smoker -- 1.5 packs/day for 57 years    Types: Cigarettes    Quit date: 12/11/2004  . Smokeless tobacco: Never Used  . Alcohol Use: No  .  Drug Use: No  . Sexually Active: Not on file   Other Topics Concern  . Not on file   Social History Narrative   Lives in [place], [place] with wife.     Family History  Problem Relation Age of Onset  . Stroke Maternal Uncle     ROS- All systems are reviewed and negative except as per the HPI above  Physical Exam: Filed Vitals:   07/30/12 1431  BP: 150/71  Pulse: 51  Height: 6' 1" (1.854 m)  Weight: 247 lb 12.8 oz (112.401 kg)  SpO2: 97%    GEN- The patient is well appearing, alert and oriented x 3 today.   Head- normocephalic, atraumatic Eyes-  Sclera clear, conjunctiva pink Ears- hearing intact Oropharynx- clear Neck- supple, no JVP Lymph- no cervical lymphadenopathy Lungs- Clear to ausculation bilaterally, normal work of breathing Heart- Regular rate and rhythm, no murmurs, rubs or gallops, PMI not laterally displaced GI- soft, NT, ND, + BS Extremities- no clubbing, cyanosis, or edema MS- no significant deformity or atrophy Skin- no rash or lesion Psych- euthymic mood, full affect Neuro- strength and sensation are intact  EKG 06/24/12- sinus bradycardia 53 bpm, LBBB (QRS 174), Qtc 540msec Holter 06/24/12- sinus rhythm with sinus bradycardia, PACs, and PVCs Cath 07/19/12 as above Echo 6/13- EF 35%, LVEDD 58, LA size 57mm, moderate MR with annular dilitation   Assessment and Plan:  

## 2012-08-03 NOTE — Assessment & Plan Note (Signed)
The patient has an ischemic CM (EF 35%), NYHA Class III CHF, and CAD.  He also has a LBBB with QRS 174 msec.  At this time, he meets MADIT II/ SCD-HeFT criteria for ICD implantation for primary prevention of sudden death.  In addition, he has a LBBB and meets criteria for CRT.  Risks, benefits, alternatives to BiV ICD implantation were discussed in detail with the patient today. The patient  understands that the risks include but are not limited to bleeding, infection, pneumothorax, perforation, tamponade, vascular damage, renal failure, MI, stroke, death, inappropriate shocks, and lead dislodgement and wishes to proceed.  We will therefore schedule device implantation at the next available time.

## 2012-08-03 NOTE — Assessment & Plan Note (Signed)
Stable Presently in sinus rhythm though given LA size of 57mm I would anticipate progression of afib in the future. Continue coumadin for long term stroke prevention

## 2012-08-03 NOTE — Assessment & Plan Note (Signed)
No ischemic symptoms Recent cath reviewed He is on an optimal medical regimen

## 2012-08-03 NOTE — Assessment & Plan Note (Signed)
As above.

## 2012-08-14 ENCOUNTER — Encounter (HOSPITAL_COMMUNITY): Payer: Self-pay | Admitting: Respiratory Therapy

## 2012-08-20 ENCOUNTER — Other Ambulatory Visit (INDEPENDENT_AMBULATORY_CARE_PROVIDER_SITE_OTHER): Payer: Medicare Other

## 2012-08-20 DIAGNOSIS — I255 Ischemic cardiomyopathy: Secondary | ICD-10-CM

## 2012-08-20 DIAGNOSIS — I2589 Other forms of chronic ischemic heart disease: Secondary | ICD-10-CM

## 2012-08-20 DIAGNOSIS — Z01810 Encounter for preprocedural cardiovascular examination: Secondary | ICD-10-CM

## 2012-08-20 LAB — CBC WITH DIFFERENTIAL/PLATELET
Basophils Relative: 1 % (ref 0.0–3.0)
Eosinophils Absolute: 0.7 10*3/uL (ref 0.0–0.7)
Eosinophils Relative: 10 % — ABNORMAL HIGH (ref 0.0–5.0)
Hemoglobin: 14.6 g/dL (ref 13.0–17.0)
Lymphocytes Relative: 33.1 % (ref 12.0–46.0)
MCHC: 32.6 g/dL (ref 30.0–36.0)
Neutro Abs: 3.1 10*3/uL (ref 1.4–7.7)
RBC: 4.71 Mil/uL (ref 4.22–5.81)
WBC: 6.6 10*3/uL (ref 4.5–10.5)

## 2012-08-20 LAB — PROTIME-INR: INR: 1.6 ratio — ABNORMAL HIGH (ref 0.8–1.0)

## 2012-08-20 LAB — BASIC METABOLIC PANEL
CO2: 27 mEq/L (ref 19–32)
Calcium: 8.6 mg/dL (ref 8.4–10.5)
Chloride: 107 mEq/L (ref 96–112)
Sodium: 142 mEq/L (ref 135–145)

## 2012-08-21 MED ORDER — CEFAZOLIN SODIUM-DEXTROSE 2-3 GM-% IV SOLR
2.0000 g | INTRAVENOUS | Status: DC
  Filled 2012-08-21 (×2): qty 50

## 2012-08-21 MED ORDER — SODIUM CHLORIDE 0.9 % IR SOLN
80.0000 mg | Status: DC
  Filled 2012-08-21 (×2): qty 2

## 2012-08-22 ENCOUNTER — Encounter (HOSPITAL_COMMUNITY)
Admission: RE | Disposition: A | Discharge status: Home / Self Care | Payer: Self-pay | Source: Ambulatory Visit | Attending: Internal Medicine

## 2012-08-22 ENCOUNTER — Encounter (HOSPITAL_COMMUNITY): Payer: Self-pay | Admitting: *Deleted

## 2012-08-22 ENCOUNTER — Ambulatory Visit (HOSPITAL_COMMUNITY)
Admission: RE | Admit: 2012-08-22 | Discharge: 2012-08-23 | Disposition: A | Discharge status: Home / Self Care | Payer: Medicare Other | Source: Ambulatory Visit | Attending: Internal Medicine | Admitting: Internal Medicine

## 2012-08-22 DIAGNOSIS — I2589 Other forms of chronic ischemic heart disease: Secondary | ICD-10-CM

## 2012-08-22 DIAGNOSIS — Z23 Encounter for immunization: Secondary | ICD-10-CM | POA: Insufficient documentation

## 2012-08-22 DIAGNOSIS — I509 Heart failure, unspecified: Secondary | ICD-10-CM

## 2012-08-22 DIAGNOSIS — I10 Essential (primary) hypertension: Secondary | ICD-10-CM

## 2012-08-22 DIAGNOSIS — I6529 Occlusion and stenosis of unspecified carotid artery: Secondary | ICD-10-CM

## 2012-08-22 DIAGNOSIS — I447 Left bundle-branch block, unspecified: Secondary | ICD-10-CM | POA: Insufficient documentation

## 2012-08-22 DIAGNOSIS — I255 Ischemic cardiomyopathy: Secondary | ICD-10-CM | POA: Diagnosis present

## 2012-08-22 DIAGNOSIS — I5022 Chronic systolic (congestive) heart failure: Secondary | ICD-10-CM | POA: Insufficient documentation

## 2012-08-22 DIAGNOSIS — J4489 Other specified chronic obstructive pulmonary disease: Secondary | ICD-10-CM | POA: Insufficient documentation

## 2012-08-22 DIAGNOSIS — J449 Chronic obstructive pulmonary disease, unspecified: Secondary | ICD-10-CM | POA: Insufficient documentation

## 2012-08-22 DIAGNOSIS — I2510 Atherosclerotic heart disease of native coronary artery without angina pectoris: Secondary | ICD-10-CM | POA: Insufficient documentation

## 2012-08-22 DIAGNOSIS — E785 Hyperlipidemia, unspecified: Secondary | ICD-10-CM | POA: Insufficient documentation

## 2012-08-22 DIAGNOSIS — I739 Peripheral vascular disease, unspecified: Secondary | ICD-10-CM | POA: Insufficient documentation

## 2012-08-22 DIAGNOSIS — I4821 Permanent atrial fibrillation: Secondary | ICD-10-CM | POA: Diagnosis present

## 2012-08-22 DIAGNOSIS — I723 Aneurysm of iliac artery: Secondary | ICD-10-CM

## 2012-08-22 DIAGNOSIS — E7800 Pure hypercholesterolemia, unspecified: Secondary | ICD-10-CM

## 2012-08-22 DIAGNOSIS — I129 Hypertensive chronic kidney disease with stage 1 through stage 4 chronic kidney disease, or unspecified chronic kidney disease: Secondary | ICD-10-CM | POA: Insufficient documentation

## 2012-08-22 DIAGNOSIS — I25810 Atherosclerosis of coronary artery bypass graft(s) without angina pectoris: Secondary | ICD-10-CM | POA: Diagnosis present

## 2012-08-22 DIAGNOSIS — I4891 Unspecified atrial fibrillation: Secondary | ICD-10-CM

## 2012-08-22 DIAGNOSIS — Z95810 Presence of automatic (implantable) cardiac defibrillator: Secondary | ICD-10-CM

## 2012-08-22 DIAGNOSIS — N189 Chronic kidney disease, unspecified: Secondary | ICD-10-CM | POA: Insufficient documentation

## 2012-08-22 HISTORY — PX: BI-VENTRICULAR IMPLANTABLE CARDIOVERTER DEFIBRILLATOR: SHX5459

## 2012-08-22 LAB — PROTIME-INR
INR: 1.17 (ref 0.00–1.49)
Prothrombin Time: 15.1 seconds (ref 11.6–15.2)

## 2012-08-22 LAB — SURGICAL PCR SCREEN: MRSA, PCR: NEGATIVE

## 2012-08-22 SURGERY — BI-VENTRICULAR IMPLANTABLE CARDIOVERTER DEFIBRILLATOR  (CRT-D)
Anesthesia: LOCAL

## 2012-08-22 MED ORDER — SODIUM CHLORIDE 0.9 % IJ SOLN: 3.0000 mL | INTRAMUSCULAR | Status: DC | PRN

## 2012-08-22 MED ORDER — HYDROCODONE-ACETAMINOPHEN 5-325 MG PO TABS: 1.0000 | ORAL_TABLET | ORAL | Status: DC | PRN

## 2012-08-22 MED ORDER — MUPIROCIN 2 % EX OINT
TOPICAL_OINTMENT | Freq: Two times a day (BID) | CUTANEOUS | Status: DC
  Administered 2012-08-22: 1 via NASAL
  Filled 2012-08-22: qty 22

## 2012-08-22 MED ORDER — AMLODIPINE BESYLATE 5 MG PO TABS
5.0000 mg | ORAL_TABLET | Freq: Every day | ORAL | Status: DC
  Administered 2012-08-22 – 2012-08-23 (×2): 5 mg via ORAL
  Filled 2012-08-22 (×2): qty 1

## 2012-08-22 MED ORDER — PNEUMOCOCCAL VAC POLYVALENT 25 MCG/0.5ML IJ INJ
0.5000 mL | INJECTION | INTRAMUSCULAR | Status: AC
  Administered 2012-08-23: 0.5 mL via INTRAMUSCULAR
  Filled 2012-08-22: qty 0.5

## 2012-08-22 MED ORDER — LIDOCAINE HCL (PF) 1 % IJ SOLN
INTRAMUSCULAR | Status: AC
  Filled 2012-08-22: qty 60

## 2012-08-22 MED ORDER — SODIUM CHLORIDE 0.9 % IV SOLN: 250.0000 mL | INTRAVENOUS | Status: DC

## 2012-08-22 MED ORDER — SODIUM CHLORIDE 0.9 % IJ SOLN
3.0000 mL | Freq: Two times a day (BID) | INTRAMUSCULAR | Status: DC
  Administered 2012-08-22 (×2): 3 mL via INTRAVENOUS
  Administered 2012-08-23: 6 mL via INTRAVENOUS

## 2012-08-22 MED ORDER — HEPARIN (PORCINE) IN NACL 2-0.9 UNIT/ML-% IJ SOLN
INTRAMUSCULAR | Status: AC
  Filled 2012-08-22: qty 500

## 2012-08-22 MED ORDER — CEFAZOLIN SODIUM 1-5 GM-% IV SOLN
1.0000 g | Freq: Four times a day (QID) | INTRAVENOUS | Status: DC
  Administered 2012-08-22 (×2): 1 g via INTRAVENOUS
  Filled 2012-08-22 (×4): qty 50

## 2012-08-22 MED ORDER — FENTANYL CITRATE 0.05 MG/ML IJ SOLN
INTRAMUSCULAR | Status: AC
  Filled 2012-08-22: qty 2

## 2012-08-22 MED ORDER — SODIUM CHLORIDE 0.45 % IV SOLN
INTRAVENOUS | Status: DC
  Administered 2012-08-22: 06:00:00 via INTRAVENOUS

## 2012-08-22 MED ORDER — WARFARIN SODIUM 5 MG PO TABS
5.0000 mg | ORAL_TABLET | ORAL | Status: DC
  Administered 2012-08-22: 5 mg via ORAL
  Filled 2012-08-22: qty 1

## 2012-08-22 MED ORDER — METOPROLOL SUCCINATE 12.5 MG HALF TABLET
12.5000 mg | ORAL_TABLET | Freq: Every day | ORAL | Status: DC
  Administered 2012-08-22 – 2012-08-23 (×2): 12.5 mg via ORAL
  Filled 2012-08-22 (×2): qty 1

## 2012-08-22 MED ORDER — WARFARIN SODIUM 7.5 MG PO TABS
7.5000 mg | ORAL_TABLET | ORAL | Status: DC
  Filled 2012-08-22: qty 1

## 2012-08-22 MED ORDER — WARFARIN - PHYSICIAN DOSING INPATIENT: Freq: Every day | Status: DC

## 2012-08-22 MED ORDER — MIDAZOLAM HCL 5 MG/5ML IJ SOLN
INTRAMUSCULAR | Status: AC
  Filled 2012-08-22: qty 5

## 2012-08-22 MED ORDER — ONDANSETRON HCL 4 MG/2ML IJ SOLN: 4.0000 mg | Freq: Four times a day (QID) | INTRAMUSCULAR | Status: DC | PRN

## 2012-08-22 MED ORDER — SODIUM CHLORIDE 0.9 % IV SOLN: 250.0000 mL | INTRAVENOUS | Status: DC | PRN

## 2012-08-22 MED ORDER — SODIUM CHLORIDE 0.9 % IJ SOLN: 3.0000 mL | Freq: Two times a day (BID) | INTRAMUSCULAR | Status: DC

## 2012-08-22 MED ORDER — POTASSIUM CHLORIDE CRYS ER 20 MEQ PO TBCR
40.0000 meq | EXTENDED_RELEASE_TABLET | Freq: Three times a day (TID) | ORAL | Status: DC
  Administered 2012-08-22 – 2012-08-23 (×3): 40 meq via ORAL
  Filled 2012-08-22 (×5): qty 2

## 2012-08-22 MED ORDER — ACETAMINOPHEN 325 MG PO TABS: 325.0000 mg | ORAL_TABLET | ORAL | Status: DC | PRN

## 2012-08-22 MED ORDER — WARFARIN SODIUM 5 MG PO TABS: 5.0000 mg | ORAL_TABLET | Freq: Every day | ORAL | Status: DC

## 2012-08-22 MED ORDER — AMIODARONE HCL 200 MG PO TABS
200.0000 mg | ORAL_TABLET | Freq: Every day | ORAL | Status: DC
  Administered 2012-08-22 – 2012-08-23 (×2): 200 mg via ORAL
  Filled 2012-08-22 (×2): qty 1

## 2012-08-22 MED ORDER — INFLUENZA VIRUS VACC SPLIT PF IM SUSP
0.5000 mL | INTRAMUSCULAR | Status: AC
  Administered 2012-08-23: 0.5 mL via INTRAMUSCULAR
  Filled 2012-08-22: qty 0.5

## 2012-08-22 MED ORDER — LISINOPRIL 40 MG PO TABS
40.0000 mg | ORAL_TABLET | Freq: Every day | ORAL | Status: DC
  Administered 2012-08-22 – 2012-08-23 (×2): 40 mg via ORAL
  Filled 2012-08-22 (×2): qty 1

## 2012-08-22 MED ORDER — MUPIROCIN 2 % EX OINT
TOPICAL_OINTMENT | CUTANEOUS | Status: AC
  Filled 2012-08-22: qty 22

## 2012-08-22 NOTE — Interval H&P Note (Signed)
History and Physical Interval Note:  08/22/2012 7:08 AM  [name] [name]  has presented today for surgery, with the diagnosis of acm  The various methods of treatment have been discussed with the patient and family. After consideration of risks, benefits and other options for treatment, the patient has consented to  Procedure(s) (LRB) with comments: BI-VENTRICULAR IMPLANTABLE CARDIOVERTER DEFIBRILLATOR  (CRT-D) (N/A) as a surgical intervention .  The patient's history has been reviewed, patient examined, no change in status, stable for surgery.  I have reviewed the patient's chart and labs.  Questions were answered to the patient's satisfaction.     [name]

## 2012-08-22 NOTE — Op Note (Signed)
SURGEON:  [name], MD      PREPROCEDURE DIAGNOSES:   1. Ischemic cardiomyopathy.   2. New York Heart Association class III, heart failure chronically.   3. Left bundle-branch block.      POSTPROCEDURE DIAGNOSES:   1. Ischemic cardiomyopathy.   2. New York Heart Association class III heart failure chronically.   3. Left bundle-branch block.      PROCEDURES:    1. Left upper extremity venography  2. Biventricular ICD implantation.  3. Defibrillation threshold testing     INTRODUCTION:  [name] [name] is a 71 y.o. male with an ischemic CM (EF 35%), NYHA Class III CHF, and CAD. He also has a LBBB with QRS 174 msec. At this time, he meets MADIT II/ SCD-HeFT criteria for ICD implantation for primary prevention of sudden death. In addition, he has a LBBB and meets criteria for CRT. The patient has been treated with an optimal medical regimen but continues to have a depressed ejection fraction and NYHA Class III CHF symptoms.  he therefore  presents today for a biventricular ICD implantation.      DESCRIPTION OF PROCEDURE:  Informed written consent was obtained and the patient was brought to the electrophysiology lab in the fasting state. The patient was adequately sedated with intravenous Versed, and fentanyl as outlined in the nursing report.  The patient's left chest was prepped and draped in the usual sterile fashion by the EP lab staff.  The skin overlying the left deltopectoral region was infiltrated with lidocaine for local analgesia.  A 5-cm incision was made over the left deltopectoral region.  A left subcutaneous defibrillator pocket was fashioned using a combination of sharp and blunt dissection.  Electrocautery was used to assure hemostasis.   Left Upper extremity Venography:  A venogram of the left upper extremity was performed which revealed a moderate sized left axillary vein which emptied into a moderate sized left subclavian vein.    RA/RV Lead Placement: The left axillary  vein was cannulated with fluoroscopic visualization.  Through the left axillary vein, a St. Jude Medical Tendril STS, model 5621HY-86  (serial # [id number]  ) right atrial lead and a [employer]. Jude Medical Boley, model 5784O-96 (serial number [id number]) right ventricular defibrillator lead were advanced with fluoroscopic visualization into the right atrial appendage and right ventricular apex positions respectively.  Initial atrial lead P-waves measured 4.3 mV with an impedance of 679 ohms and a threshold of 0.7 volts at 0.5 milliseconds.  The right ventricular lead R-wave measured 8.3 mV with impedance of 786 ohms and a threshold of 0.6 volts at 0.5 milliseconds.   LV Lead Placement: A Medtronic MB-2 guide was advanced through the left axillary vein into the low lateral right atrium.  A Bard curved Damato catheter was introduced through the MB-2 guide and used to cannulate the coronary sinus.  Coronary sinus cannulation was confirmed with electrogram recording from the hexapolar catheter.  A coronary sinus selective venography balloon was advanced through the MB- 2 guide and advanced into the proximal portion of the coronary sinus.  A selective coronary sinus venogram was performed by hand injection of nonionic contrast.  This demonstrated a small sized and tortuous proximal lateral coronary sinus branch as well as a small distal lateral branch.  A posterolateral branch was also faintly visibletified.   A Whisper CSJ wire was introduced through the transseptal sheath and advanced into the distal lateral branch.  A St. Jude Medical Quartet model [id number] [phone] (serial number  [id number]) lead was advanced through the MB-2 into the distal posterolateral branch.  Unfortunately, this vein was too small and would not accommodate the lead.  The proximal lateral branch was too tortuous and could also not accommodate lead placement.  I therefore elected to place the lead into the poterolateral branch.  The lead was advanced  without difficulty.  This was  approximately two-thirds from the base to the apex in a very lateral position.  The 3rd and 4th electrode pairs were 1/2 way between the base and apex of the LV.  In this location, the left ventricular lead R-waves measured  16 mV with impedance of 397 ohms and a threshold of 0.5 volt at 0.5  milliseconds in the P4 to RV coid configuration with no diaphragmatic  stimulation observed when pacing at 10 volts output.  The MB-2 guide was  therefore removed.     All three leads were secured to the pectoralis  fascia using #2 silk suture over the suture sleeves.  The pocket then  irrigated with copious gentamicin solution.  The leads were then  connected to a St. Jude Medical Quadra Assura model CD [phone] (serial  Number [id number]) biventricular ICD.  The defibrillator was placed into the  pocket.  The pocket was then closed in 2 layers with 2.0 Vicryl suture  for the subcutaneous and subcuticular layers.  Steri-Strips and a  sterile dressing were then applied.   DFT Testing: Defibrillation Threshold testing was then performed. Ventricular fibrillation was induced with a T shock.  Adequate sensing of ventricular  fibrillation was observed with minimal dropout with a programmed sensitivity of 1.51mV.  The patient was successfully defibrillated to sinus rhythm with a single 15 joules shock delivered from the device with an impedance of 70 ohms in a duration of 6.5 seconds.  The patient remained in sinus rhythm thereafter.  There were no early apparent complications.  Programmed Extrastimulus testing:  Programmed extrastimulus testing was performed through the device with a basic cycle length of with S1,S2,S3,S4 extrastimuli down to refractoriness (500/290/240/200 msec) with no sustained VT or VF observed.  The procedure was therefore considered completed.  There were no early apparhent complications.     CONCLUSIONS:   1. Ischemic cardiomyopathy with Left bundle-branch  block and chronic New York Heart Association class III heart failure.   2. Successful biventricular ICD implantation.   3. DFT less than or equal to 15 joules.   4. No inducible VT or VF with PES  5. No early apparent complications.

## 2012-08-22 NOTE — H&P (View-Only) (Signed)
Primary Care Physician: [name], MD Referring Physician:  [name] is a 71 y.o. male with a h/o CAD, ischemic CM (EF 35%), and LBBB who presents for EP follow-up.  He is well known to Dr [name].  He is s/p CABG 10/07.  He has been treated with an optimal medical regimen but continues to have progressive worsening of his EF.  He was referred to Dr Graciela Husbands for evaluation for ICD implantation.  It was felt that he needed a cath.  He therefore underwent left heart catheterization which revealed patent bypass grafts there were no targets for revascularization.  The patient therefore returns for further consideration of ICD implantation. He appears to be doing well.  He remains active but reports SOB with moderate activity.   Today, he denies symptoms of palpitations, chest pain, orthopnea, PND, lower extremity edema, dizziness, presyncope, syncope, or neurologic sequela. The patient is tolerating medications without difficulties and is otherwise without complaint today.   Past Medical History  Diagnosis Date  . Atrial fibrillation     s/p prior DCCV;  Amiodarone Rx.  . CAD (coronary artery disease)     s/p CABG;  cath 10/07:  LM ok, LAD occluded, AV CFX 95%, pRCA occluded; L-LAD, S-OM2/OM3, S-PDA ok  . Cerebrovascular disease     s/p prior Left CEA;  followed by Dr. [name]  . COPD (chronic obstructive pulmonary disease)   . Hyperlipidemia   . Hypertension   . Mitral insufficiency     hx of. not noted on echo 01/2012  . Nephrolithiasis   . Ischemic cardiomyopathy     echo 5/09: EF 55%, mild LAE;  Myoview 11/10: inf and apical scar with mild peri-infarct ischemia, EF 44%. Echo 01/26/12 with EF 20-25% but previously known to be 30% on echoes before last one  . Iliac artery aneurysm, left     followed by Dr. [name]  . Aortic dissection     H/O focal aortic dissection  . LBBB (left bundle branch block)   . CHF (congestive heart failure)   . Myocardial infarction   .  Renal insufficiency     01/2012   Past Surgical History  Procedure Date  . Uteroscopy   . Coronary artery bypass graft 1998  . Left cea 07/05  . Appendectomy 09/11/11  . Carotid endarterectomy 2005    left    Current Outpatient Prescriptions  Medication Sig Dispense Refill  . amiodarone (PACERONE) 200 MG tablet Take 1 tablet (200 mg total) by mouth daily.  90 tablet  4  . amLODipine (NORVASC) 10 MG tablet Take 0.5 tablets (5 mg total) by mouth daily.  90 tablet  4  . furosemide (LASIX) 40 MG tablet Take 1 tablet (40 mg total) by mouth daily.  90 tablet  3  . hydrALAZINE (APRESOLINE) 50 MG tablet Take 25 mg by mouth 2 (two) times daily.       [name] lisinopril (PRINIVIL,ZESTRIL) 40 MG tablet Take 1 tablet (40 mg total) by mouth daily.  90 tablet  4  . metoprolol succinate (TOPROL-XL) 25 MG 24 hr tablet Take 0.5 tablets (12.5 mg total) by mouth daily.  30 tablet  6  . Multiple Vitamin (MULITIVITAMIN WITH MINERALS) TABS Take 1 tablet by mouth daily.      . niacin (NIASPAN) 500 MG CR tablet Take 1,000 mg by mouth 2 (two) times daily. 2 tabs po qhs      . nitroGLYCERIN (NITROSTAT) 0.4 MG SL tablet Place  0.4 mg under the tongue every 5 (five) minutes as needed. For chest pain      . potassium chloride SA (KLOR-CON M20) 20 MEQ tablet Take 40 mEq by mouth 3 (three) times daily.        . pravastatin (PRAVACHOL) 80 MG tablet Take 1 tablet (80 mg total) by mouth daily.  90 tablet  4  . warfarin (COUMADIN) 5 MG tablet Take 5-7.5 mg by mouth daily. Monday and Friday = 7.5 mg.   5 mg all other days        No Known Allergies  History   Social History  . Marital Status: Married    Spouse Name: N/A    Number of Children: N/A  . Years of Education: N/A   Occupational History  . RETIRED---truck driver    Social History Main Topics  . Smoking status: Former Smoker -- 1.5 packs/day for 57 years    Types: Cigarettes    Quit date: 12/11/2004  . Smokeless tobacco: Never Used  . Alcohol Use: No  .  Drug Use: No  . Sexually Active: Not on file   Other Topics Concern  . Not on file   Social History Narrative   Lives in [place], [place] with wife.     Family History  Problem Relation Age of Onset  . Stroke Maternal Uncle     ROS- All systems are reviewed and negative except as per the HPI above  Physical Exam: Filed Vitals:   07/30/12 1431  BP: 150/71  Pulse: 51  Height: 6\' 1"  (1.854 m)  Weight: 247 lb 12.8 oz (112.401 kg)  SpO2: 97%    GEN- The patient is well appearing, alert and oriented x 3 today.   Head- normocephalic, atraumatic Eyes-  Sclera clear, conjunctiva pink Ears- hearing intact Oropharynx- clear Neck- supple, no JVP Lymph- no cervical lymphadenopathy Lungs- Clear to ausculation bilaterally, normal work of breathing Heart- Regular rate and rhythm, no murmurs, rubs or gallops, PMI not laterally displaced GI- soft, NT, ND, + BS Extremities- no clubbing, cyanosis, or edema MS- no significant deformity or atrophy Skin- no rash or lesion Psych- euthymic mood, full affect Neuro- strength and sensation are intact  EKG 06/24/12- sinus bradycardia 53 bpm, LBBB (QRS 174), Qtc Holter 06/24/12- sinus rhythm with sinus bradycardia, PACs, and PVCs Cath 07/19/12 as above Echo 6/13- EF 35%, LVEDD 58, LA size 57mm, moderate MR with annular dilitation   Assessment and Plan:

## 2012-08-23 ENCOUNTER — Ambulatory Visit (HOSPITAL_COMMUNITY): Payer: Medicare Other

## 2012-08-23 DIAGNOSIS — I2589 Other forms of chronic ischemic heart disease: Secondary | ICD-10-CM

## 2012-08-23 LAB — BASIC METABOLIC PANEL
CO2: 26 mEq/L (ref 19–32)
Chloride: 111 mEq/L (ref 96–112)
Glucose, Bld: 101 mg/dL — ABNORMAL HIGH (ref 70–99)
Potassium: 3.7 mEq/L (ref 3.5–5.1)
Sodium: 147 mEq/L — ABNORMAL HIGH (ref 135–145)

## 2012-08-23 LAB — PROTIME-INR
INR: 1.15 (ref 0.00–1.49)
Prothrombin Time: 14.9 seconds (ref 11.6–15.2)

## 2012-08-23 MED ORDER — CEFAZOLIN SODIUM 1-5 GM-% IV SOLN
1.0000 g | Freq: Four times a day (QID) | INTRAVENOUS | Status: AC
  Administered 2012-08-23: 1 g via INTRAVENOUS
  Filled 2012-08-23: qty 50

## 2012-08-23 NOTE — Discharge Summary (Addendum)
ELECTROPHYSIOLOGY DISCHARGE SUMMARY    Patient ID: [name] [name],  MRN: [id number], DOB/AGE: [birth year] 71 y.o.  Admit date: 08/22/2012 Discharge date: 08/23/2012  Primary Care Physician: Myra Gianotti, MD Primary Cardiologist: [name], MD  Primary Discharge Diagnosis:  1. Ischemic CM with chronic systolic HF s/p BiV ICD implantation  Secondary Discharge Diagnoses:  1. CAD s/p CABG 2. LBBB 3. Atrial fibrillation 4. COPD 5. PVD, s/p left CEA 6. HTN 7. Dyslipidemia 8. History of focal aortic dissection 9. CKD 10. Nephrolithiasis  Procedures This Admission:  1. BiV ICD implantation 08/22/2012 [employer], model 1191YN-82 (serial # [id number] ) right atrial lead and a [employer] Declo, model 5784O-96 (serial number [id number]) right ventricular defibrillator lead were advanced with fluoroscopic visualization into the right atrial appendage and right ventricular apex positions respectively. Initial atrial lead P-waves measured 4.3 mV with an impedance of 679 ohms and a threshold of 0.7 volts at 0.5 milliseconds. The right ventricular lead R-wave measured 8.3 mV with impedance of 786 ohms and a threshold of 0.6 volts at 0.5 milliseconds. [employer] Quartet model 1458T - 86 (serial number [id number]) lead was advanced through the MB-2 into the distal posterolateral branch. Unfortunately, this vein was too small and would not accommodate the lead. The proximal lateral branch was too tortuous and could also not accommodate lead placement. I therefore elected to place the lead into the poterolateral branch. The lead was advanced without difficulty. This was approximately two-thirds from the base to the apex in a very lateral position. The 3rd and 4th electrode pairs were 1/2 way between the base and apex of the LV. In this location, the left ventricular lead R-waves measured 16 mV with impedance of 397 ohms and a threshold of 0.5 volt at 0.5 milliseconds in the P4  to RV coid configuration with no diaphragmatic stimulation observed when pacing at 10 volts output. [employer] Quadra Assura model [place] 2952 - [phone] (serial Number [id number]) biventricular ICD. DFT 15J. No inducible VT or VF with PES.  History and Hospital Course:  [name] [name] is a 71 year old gentleman with CAD s/p CABG, ischemic CM (EF 35%), chronic systolic HF and LBBB who has been treated with an optimal medical regimen but continues to have progressively worsening LV function. He was referred to Dr Graciela Husbands for evaluation for ICD implantation. It was felt that he needed a cath. He therefore underwent left heart catheterization which revealed patent bypass grafts and there were no targets for revascularization. [name] [name] then returned for EP follow-up and discussion regarding ICD implantation. He elected to proceed and presented yesterday 08/22/2012 for BiV ICD implantation. He tolerated this procedure well without any immediate complication. He remains hemodynamically stable and afebrile. His chest xray shows stable lead placement without pneumothorax. His device interrogation has been reviewed by Dr. [name] and shows normal BiV ICD function with stable lead parameters/measurements. His implant site is intact without significant bleeding or hematoma. He has been given discharge instructions including wound care and activity restrictions. He will follow-up in 10 days for wound check. There were no changes made to his medications. He has been seen, examined and deemed stable for discharge today by Dr. [name].  Discharge Vitals: Blood pressure 160/91, pulse 59, temperature 98.1 F (36.7 C), temperature source Oral, resp. rate 16, height 6\' 1"  (1.854 m), weight 249 lb 14.4 oz (113.354 kg), SpO2 97.00%.   Labs: Lab Results  Component Value Date   WBC 6.6  08/20/2012   HGB 14.6 08/20/2012   HCT 44.7 08/20/2012   MCV 94.9 08/20/2012   PLT 164.0 08/20/2012     Lab 08/23/12 0510  NA 147*  K 3.7    CL 111  CO2 26  BUN 14  CREATININE 0.99  CALCIUM 9.1  PROT --  BILITOT --  ALKPHOS --  ALT --  AST --  GLUCOSE 101*    Basename 08/23/12 0510  INR 1.15    Disposition:  The patient is being discharged in stable condition.  Follow-up: Follow-up Information    Follow up with [facility] On 09/05/2012. (At 12:30 PM for wound check)    Contact information:   [address]  [address] [place] [address] [phone]       Follow up with [name], MD. On 11/25/2012. (At 10:00 AM)    Contact information:   [address]  [address] [place] [address] [phone]   Discharge Medications:    Medication List     As of 08/23/2012  9:43 AM    TAKE these medications         amiodarone 200 MG tablet   Commonly known as: PACERONE   Take 1 tablet (200 mg total) by mouth daily.      amLODipine 10 MG tablet   Commonly known as: NORVASC   Take 0.5 tablets (5 mg total) by mouth daily.      furosemide 40 MG tablet   Commonly known as: LASIX   Take 1 tablet (40 mg total) by mouth daily.      hydrALAZINE 50 MG tablet   Commonly known as: APRESOLINE   Take 25 mg by mouth 2 (two) times daily.      KLOR-CON M20 20 MEQ tablet   Generic drug: potassium chloride SA   Take 40 mEq by mouth 3 (three) times daily.      lisinopril 40 MG tablet   Commonly known as: PRINIVIL,ZESTRIL   Take 1 tablet (40 mg total) by mouth daily.      metoprolol succinate 25 MG 24 hr tablet   Commonly known as: TOPROL-XL   Take 0.5 tablets (12.5 mg total) by mouth daily.      multivitamin with minerals Tabs   Take 1 tablet by mouth daily.      niacin 500 MG CR tablet   Commonly known as: NIASPAN   Take 1,000 mg by mouth 2 (two) times daily. 2 tabs po qhs      nitroGLYCERIN 0.4 MG SL tablet   Commonly known as: NITROSTAT   Place 0.4 mg under the tongue every 5 (five) minutes as needed. For chest pain      pravastatin 80 MG tablet   Commonly known as: PRAVACHOL    Take 1 tablet (80 mg total) by mouth daily.      warfarin 5 MG tablet   Commonly known as: COUMADIN   Take 5-7.5 mg by mouth daily. Monday, Wednesday and Friday = 7.5 mg.   5 mg all other days       Duration of Discharge Encounter: Greater than 30 minutes including physician time.  Limmie Patricia, PA-C 08/23/2012, 9:43 AM  [name], MD

## 2012-08-23 NOTE — Progress Notes (Signed)
SUBJECTIVE: The patient is doing well today.  At this time, he denies chest pain, shortness of breath, or any new concerns.     [name] amiodarone  200 mg Oral Daily  . amLODipine  5 mg Oral Daily  .  ceFAZolin (ANCEF) IV  1 g Intravenous Q6H  . influenza  inactive virus vaccine  0.5 mL Intramuscular Tomorrow-1000  . lisinopril  40 mg Oral Daily  . metoprolol succinate  12.5 mg Oral Daily  . midazolam      . mupirocin ointment      . pneumococcal 23 valent vaccine  0.5 mL Intramuscular Tomorrow-1000  . potassium chloride SA  40 mEq Oral TID  . sodium chloride  3 mL Intravenous Q12H  . warfarin  5 mg Oral Q T,Th,S,Su-1800  . warfarin  7.5 mg Oral Q M,W,F-1800  . Warfarin - Physician Dosing Inpatient   Does not apply q1800  . DISCONTD:  ceFAZolin (ANCEF) IV  1 g Intravenous Q6H  . DISCONTD:  ceFAZolin (ANCEF) IV  2 g Intravenous On Call  . DISCONTD: gentamicin irrigation  80 mg Irrigation On Call  . DISCONTD: mupirocin ointment   Nasal BID  . DISCONTD: sodium chloride  3 mL Intravenous Q12H  . DISCONTD: warfarin  5-7.5 mg Oral Daily      . DISCONTD: sodium chloride 50 mL/hr at 08/22/12 0624  . DISCONTD: sodium chloride      OBJECTIVE: Physical Exam: Filed Vitals:   08/22/12 1200 08/22/12 1418 08/22/12 2040 08/23/12 0543  BP:  141/69 158/58 160/91  Pulse: 61 61 59 59  Temp:  98.6 F (37 C) 98.5 F (36.9 C) 98.1 F (36.7 C)  TempSrc:  Oral Oral Oral  Resp:  16 16 16   Height:      Weight:    249 lb 14.4 oz (113.354 kg)  SpO2:  95% 97% 97%    Intake/Output Summary (Last 24 hours) at 08/23/12 0802 Last data filed at 08/23/12 1610  Gross per 24 hour  Intake   1233 ml  Output   1850 ml  Net   -617 ml    Telemetry reveals sinus rhythm, BiV paced  GEN- The patient is well appearing, alert and oriented x 3 today.   Head- normocephalic, atraumatic Eyes-  Sclera clear, conjunctiva pink Ears- hearing intact Oropharynx- clear Neck- supple, no JVP Lymph- no cervical  lymphadenopathy Lungs- Clear to ausculation bilaterally, normal work of breathing Heart- Regular rate and rhythm, no murmurs, rubs or gallops, PMI not laterally displaced GI- soft, NT, ND, + BS Extremities- no clubbing, cyanosis, or edema ICD pocket with moderate ecchymosis but no hematoma  LABS: Basic Metabolic Panel:  Basename 08/23/12 0510 08/20/12 1007  NA 147* 142  K 3.7 3.4*  CL 111 107  CO2 26 27  GLUCOSE 101* 93  BUN 14 19  CREATININE 0.99 1.3  CALCIUM 9.1 8.6  MG -- --  PHOS -- --   Liver Function Tests: No results found for this basename: AST:2,ALT:2,ALKPHOS:2,BILITOT:2,PROT:2,ALBUMIN:2 in the last 72 hours No results found for this basename: LIPASE:2,AMYLASE:2 in the last 72 hours CBC:  Basename 08/20/12 1007  WBC 6.6  NEUTROABS 3.1  HGB 14.6  HCT 44.7  MCV 94.9  PLT 164.0    ASSESSMENT AND PLAN:  Active Problems:  CORONARY ATHEROSCLEROSIS NATIVE CORONARY ARTERY  CARDIOMYOPATHY, ISCHEMIC  Atrial fibrillation  Chronic systolic heart failure  1. Chronic systolic dysfunction Doing well s/p BiV ICD CXR reveals stable leads Device interrogation reviewed and  is normal (see paper chart) Routine wound care and follow-up  2. afib Resume coumadin  DC to home today Wound check in 10 days, I will see in the office in 3months  [name], MD 08/23/2012 8:02 AM

## 2012-08-23 NOTE — Progress Notes (Signed)
Pressure Dressing

## 2012-08-23 NOTE — Progress Notes (Signed)
All d/c instructions explained and given to pt.  Verbalized understanding.  D/c off floor via W/C.  Amanda Pea, Charity fundraiser.

## 2012-08-26 ENCOUNTER — Encounter: Payer: Self-pay | Admitting: *Deleted

## 2012-08-26 DIAGNOSIS — Z95810 Presence of automatic (implantable) cardiac defibrillator: Secondary | ICD-10-CM | POA: Insufficient documentation

## 2012-09-05 ENCOUNTER — Ambulatory Visit (INDEPENDENT_AMBULATORY_CARE_PROVIDER_SITE_OTHER): Payer: Medicare Other | Admitting: *Deleted

## 2012-09-05 ENCOUNTER — Encounter: Payer: Medicare Other | Admitting: Cardiology

## 2012-09-05 ENCOUNTER — Encounter: Payer: Self-pay | Admitting: Internal Medicine

## 2012-09-05 DIAGNOSIS — I428 Other cardiomyopathies: Secondary | ICD-10-CM

## 2012-09-05 DIAGNOSIS — I2589 Other forms of chronic ischemic heart disease: Secondary | ICD-10-CM

## 2012-09-05 DIAGNOSIS — Z7901 Long term (current) use of anticoagulants: Secondary | ICD-10-CM

## 2012-09-05 DIAGNOSIS — I4891 Unspecified atrial fibrillation: Secondary | ICD-10-CM

## 2012-09-05 LAB — ICD DEVICE OBSERVATION
AL IMPEDENCE ICD: 550 Ohm
AL THRESHOLD: 0.625 V
ATRIAL PACING ICD: 90 pct
BAMS-0001: 150 {beats}/min
BAMS-0003: 70 {beats}/min
CHARGE TIME: 8.4 s
HV IMPEDENCE: 63 Ohm
LV LEAD THRESHOLD: 1.625 V
MODE SWITCH EPISODES: 0
PACEART VT: 0
RV LEAD IMPEDENCE ICD: 450 Ohm
RV LEAD THRESHOLD: 0.625 V
TOT-0006: 20130912000000
TOT-0007: 1
TOT-0010: 1
TZON-0003SLOWVT: 340 ms

## 2012-09-05 LAB — POCT INR: INR: 2.1

## 2012-09-05 NOTE — Progress Notes (Signed)
Wound check defib in clinic  

## 2012-09-30 ENCOUNTER — Telehealth: Payer: Self-pay | Admitting: *Deleted

## 2012-09-30 ENCOUNTER — Other Ambulatory Visit: Payer: Self-pay

## 2012-09-30 MED ORDER — WARFARIN SODIUM 5 MG PO TABS: ORAL_TABLET | ORAL | Status: DC

## 2012-09-30 NOTE — Telephone Encounter (Signed)
Pt needs refills on coumadin sent to [facility]

## 2012-10-03 ENCOUNTER — Ambulatory Visit (INDEPENDENT_AMBULATORY_CARE_PROVIDER_SITE_OTHER): Payer: Medicare Other | Admitting: *Deleted

## 2012-10-03 DIAGNOSIS — Z7901 Long term (current) use of anticoagulants: Secondary | ICD-10-CM

## 2012-10-03 DIAGNOSIS — I4891 Unspecified atrial fibrillation: Secondary | ICD-10-CM

## 2012-10-03 MED ORDER — WARFARIN SODIUM 5 MG PO TABS: ORAL_TABLET | ORAL | Status: DC

## 2012-10-17 ENCOUNTER — Ambulatory Visit (INDEPENDENT_AMBULATORY_CARE_PROVIDER_SITE_OTHER): Payer: Medicare Other | Admitting: Cardiology

## 2012-10-17 ENCOUNTER — Ambulatory Visit (INDEPENDENT_AMBULATORY_CARE_PROVIDER_SITE_OTHER): Payer: Medicare Other

## 2012-10-17 ENCOUNTER — Telehealth: Payer: Self-pay | Admitting: Internal Medicine

## 2012-10-17 ENCOUNTER — Encounter: Payer: Self-pay | Admitting: Cardiology

## 2012-10-17 VITALS — BP 122/67 | HR 60 | Ht 73.0 in | Wt 249.0 lb

## 2012-10-17 DIAGNOSIS — I2589 Other forms of chronic ischemic heart disease: Secondary | ICD-10-CM

## 2012-10-17 DIAGNOSIS — I4891 Unspecified atrial fibrillation: Secondary | ICD-10-CM

## 2012-10-17 DIAGNOSIS — I714 Abdominal aortic aneurysm, without rupture: Secondary | ICD-10-CM

## 2012-10-17 DIAGNOSIS — I6529 Occlusion and stenosis of unspecified carotid artery: Secondary | ICD-10-CM

## 2012-10-17 DIAGNOSIS — E7800 Pure hypercholesterolemia, unspecified: Secondary | ICD-10-CM

## 2012-10-17 DIAGNOSIS — I723 Aneurysm of iliac artery: Secondary | ICD-10-CM

## 2012-10-17 DIAGNOSIS — Z7901 Long term (current) use of anticoagulants: Secondary | ICD-10-CM

## 2012-10-17 DIAGNOSIS — Z95810 Presence of automatic (implantable) cardiac defibrillator: Secondary | ICD-10-CM

## 2012-10-17 DIAGNOSIS — I2510 Atherosclerotic heart disease of native coronary artery without angina pectoris: Secondary | ICD-10-CM

## 2012-10-17 DIAGNOSIS — I10 Essential (primary) hypertension: Secondary | ICD-10-CM

## 2012-10-17 LAB — POCT INR: INR: 2.5

## 2012-10-17 NOTE — Progress Notes (Signed)
HPI: Pleasant male for FU of CAD. He has a history of coronary artery disease, status post coronary artery bypassing graft, history of ischemic cardiomyopathy as well as COPD. Last echocardiogram in June of 2013 revealed and ejection fraction of 35%, grade 2 diastolic dysfunction, mildly dilated aortic root, moderate mitral regurgitation, moderate left atrial enlargement and mild right atrial enlargement. His last catheterization in August of 2013 showed normal left main, occluded LAD, a small OM 2 with an ostial 95% lesion, 2 moderate size obtuse marginals were occluded. The right coronary was occluded. The LIMA to the LAD was patent. The saphenous vein graft to the diagonal had a 50% proximal stenosis. The saphenous vein graft to the obtuse marginal was patent. Saphenous vein graft to the right coronary artery had a mid 40% lesion. Ejection fraction was 35%. Patient subsequently had a biventricular ICD implanted in September of 2013. He also has a history of focal aortic dissection being managed medically. Note, he also has a left iliac artery aneurysm and mild cerebrovascular disease with history of carotid endarterectomy. He is followed by vascular surgery (Dr. [name]) for these issues. CTA Feb 2013 showed no aortic dissection or pulmonary embolus. Patient states that since his defibrillator was put in he feels much better. He has some dyspnea on exertion but improved. No orthopnea, PND, pedal edema, chest pain or syncope.   Current Outpatient Prescriptions  Medication Sig Dispense Refill  . amiodarone (PACERONE) 200 MG tablet Take 1 tablet (200 mg total) by mouth daily.  90 tablet  4  . amLODipine (NORVASC) 10 MG tablet Take 0.5 tablets (5 mg total) by mouth daily.  90 tablet  4  . furosemide (LASIX) 40 MG tablet Take 1 tablet (40 mg total) by mouth daily.  90 tablet  3  . hydrALAZINE (APRESOLINE) 50 MG tablet Take 25 mg by mouth 2 (two) times daily.       [name] lisinopril (PRINIVIL,ZESTRIL) 40 MG  tablet Take 1 tablet (40 mg total) by mouth daily.  90 tablet  4  . metoprolol succinate (TOPROL-XL) 25 MG 24 hr tablet Take 0.5 tablets (12.5 mg total) by mouth daily.  30 tablet  6  . Multiple Vitamin (MULITIVITAMIN WITH MINERALS) TABS Take 1 tablet by mouth daily.      . niacin (NIASPAN) 500 MG CR tablet Take 500 mg by mouth 2 (two) times daily.       . nitroGLYCERIN (NITROSTAT) 0.4 MG SL tablet Place 0.4 mg under the tongue every 5 (five) minutes as needed. For chest pain      . potassium chloride SA (KLOR-CON M20) 20 MEQ tablet Take 40 mEq by mouth 3 (three) times daily.        . pravastatin (PRAVACHOL) 80 MG tablet Take 1 tablet (80 mg total) by mouth daily.  90 tablet  4  . warfarin (COUMADIN) 5 MG tablet Take as directed by anticoagulation clinic  135 tablet  1     Past Medical History  Diagnosis Date  . Atrial fibrillation     s/p prior DCCV;  Amiodarone Rx.  . CAD (coronary artery disease)     s/p CABG;  cath 10/07:  LM ok, LAD occluded, AV CFX 95%, pRCA occluded; L-LAD, S-OM2/OM3, S-PDA ok  . Cerebrovascular disease     s/p prior Left CEA;  followed by Dr. [name]  . COPD (chronic obstructive pulmonary disease)   . Hyperlipidemia   . Hypertension   . Mitral insufficiency     hx  of. not noted on echo 01/2012  . Nephrolithiasis   . Ischemic cardiomyopathy     echo 5/09: EF 55%, mild LAE;  Myoview 11/10: inf and apical scar with mild peri-infarct ischemia, EF 44%. Echo 01/26/12 with EF 20-25% but previously known to be 30% on echoes before last one  . Iliac artery aneurysm, left     followed by Dr. [name]  . Aortic dissection     H/O focal aortic dissection  . LBBB (left bundle branch block)   . CHF (congestive heart failure)   . Myocardial infarction   . Renal insufficiency     01/2012    Past Surgical History  Procedure Date  . Uteroscopy   . Coronary artery bypass graft 1998  . Left cea 07/05  . Appendectomy 09/11/11  . Carotid endarterectomy 2005    left  . Biv icd  genertaor change out     History   Social History  . Marital Status: Married    Spouse Name: N/A    Number of Children: N/A  . Years of Education: N/A   Occupational History  . RETIRED---truck driver    Social History Main Topics  . Smoking status: Former Smoker -- 1.5 packs/day for 57 years    Types: Cigarettes    Quit date: 12/11/2004  . Smokeless tobacco: Never Used  . Alcohol Use: No  . Drug Use: No  . Sexually Active: No   Other Topics Concern  . Not on file   Social History Narrative   Lives in [place], [place] with wife.     ROS: no fevers or chills, productive cough, hemoptysis, dysphasia, odynophagia, melena, hematochezia, dysuria, hematuria, rash, seizure activity, orthopnea, PND, pedal edema, claudication. Remaining systems are negative.  Physical Exam: Well-developed well-nourished in no acute distress.  Skin is warm and dry.  HEENT is normal.  Neck is supple.  Chest is clear to auscultation with normal expansion. ICD left chest Cardiovascular exam is regular rate and rhythm.  Abdominal exam nontender or distended. No masses palpated. Extremities show no edema. neuro grossly intact

## 2012-10-17 NOTE — Assessment & Plan Note (Signed)
Management per electrophysiology. 

## 2012-10-17 NOTE — Assessment & Plan Note (Signed)
Previous focal dissection. Followed by vascular surgery.

## 2012-10-17 NOTE — Assessment & Plan Note (Signed)
Continue statin. 

## 2012-10-17 NOTE — Assessment & Plan Note (Signed)
Continue aspirin and statin. Followed by vascular surgery. 

## 2012-10-17 NOTE — Assessment & Plan Note (Signed)
Continue ACE inhibitor and beta blocker. 

## 2012-10-17 NOTE — Assessment & Plan Note (Signed)
Blood pressure controlled. Continue present medications. 

## 2012-10-17 NOTE — Telephone Encounter (Signed)
Pt calling for [name] [name] re transmitter

## 2012-10-17 NOTE — Assessment & Plan Note (Signed)
Continue amiodarone and Coumadin. Repeat chest x-ray, liver functions and TSH when he returns in 6 months.

## 2012-10-17 NOTE — Assessment & Plan Note (Signed)
Followed by vascular surgery. 

## 2012-10-17 NOTE — Patient Instructions (Addendum)
Your physician wants you to follow-up in: 6 MONTHS WITH DR CRENSHAW You will receive a reminder letter in the mail two months in advance. If you don't receive a letter, please call our office to schedule the follow-up appointment.  

## 2012-10-17 NOTE — Assessment & Plan Note (Signed)
Continue statin. Not on aspirin given need for Coumadin. 

## 2012-10-17 NOTE — Telephone Encounter (Signed)
Patient need cell adapter for merlin transmissions, Bonna Gains aware and will mail to the patient.

## 2012-10-18 ENCOUNTER — Other Ambulatory Visit: Payer: Self-pay | Admitting: *Deleted

## 2012-10-18 MED ORDER — METOPROLOL SUCCINATE ER 25 MG PO TB24: 12.5000 mg | ORAL_TABLET | Freq: Every day | ORAL | Status: DC

## 2012-11-13 ENCOUNTER — Encounter: Payer: Self-pay | Admitting: Emergency Medicine

## 2012-11-13 ENCOUNTER — Ambulatory Visit (INDEPENDENT_AMBULATORY_CARE_PROVIDER_SITE_OTHER): Payer: Medicare Other | Admitting: Emergency Medicine

## 2012-11-13 ENCOUNTER — Ambulatory Visit (INDEPENDENT_AMBULATORY_CARE_PROVIDER_SITE_OTHER): Payer: Medicare Other | Admitting: *Deleted

## 2012-11-13 VITALS — BP 120/70 | HR 70 | Temp 97.3°F | Ht 73.0 in | Wt 257.0 lb

## 2012-11-13 DIAGNOSIS — I4891 Unspecified atrial fibrillation: Secondary | ICD-10-CM

## 2012-11-13 DIAGNOSIS — Z7901 Long term (current) use of anticoagulants: Secondary | ICD-10-CM

## 2012-11-13 DIAGNOSIS — J449 Chronic obstructive pulmonary disease, unspecified: Secondary | ICD-10-CM

## 2012-11-13 LAB — POCT INR: INR: 2.4

## 2012-11-13 NOTE — Patient Instructions (Addendum)
Please continue to have your albuterol inhaler available to use 2 puffs if needed for shortness of breath Contact our office if you have any changes in your breathing Otherwise follow with Dr Delton Coombes in 1 year

## 2012-11-13 NOTE — Progress Notes (Signed)
  Subjective:    Patient ID: [name] [name], male    DOB: 11/29/1941, 71 y.o.   MRN: [id number] HPI 71 yo man, former tobacco (80+ pk-yrs), hx of CAD, HTN, A Fib, carotid dz. Was hospitalized 9-09/2011 for appendectomy, and during that hosp was found to have hypoxemia. Pulm consulted and he was suspected to have COPD + restriction due to abd compliance. He is not quite back up to usual functional capacity yet. He describes exertional SOB after walking 200 ft. No real cough. On coumadin for A Fib. He has had PFT [facility] several yrs ago. Not on BD's at this time.   ROV 11/14/11 -- Hx Tobacco use, dyspnea, returns to eval SOB following his PFT today. Shows mild AFL, no BD response, normal volumes. He has been dieting, has lost about 20 lbs since hospitalization. His breathing is better - still with some limitations but able to do more.   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 Follows at [facility] for CAD, HTN, A Fib. Since last time he has had a pacer placed, followed by Dr [name]. He had some SOB that improved when the pacer was placed. He is not on scheduled BD's at this time. He sometimes has wheezing, no associated CP or tightness. He used SABA a few times but never really helped him. He has put back on about 15 lbs.       Objective:   Physical Exam Filed Vitals:   11/13/12 1555  BP: 120/70  Pulse: 70  Temp: 97.3 F (36.3 C)    Gen: Pleasant, obese, in no distress,  normal affect  ENT: No lesions,  mouth clear,  oropharynx clear, no postnasal drip  Neck: No JVD, no TMG, no carotid bruits  Lungs: No use of accessory muscles, no dullness to percussion, mild wheeze on forced exp  Cardiovascular: RRR, heart sounds normal, no murmur or gallops, no peripheral edema  Musculoskeletal: No deformities, no cyanosis or clubbing  Neuro: alert, non focal  Skin: Warm, no lesions or rashes     Assessment & Plan:  COPD (chronic obstructive pulmonary disease) Appears to  be clinically stable - had a significant improvement in breathing and overall status when he got his pacer/AICD - no scheduled BD's for now, continue to consider this depending on sx - SABA prn - rov 1 yr or prn

## 2012-11-13 NOTE — Assessment & Plan Note (Signed)
Appears to be clinically stable - had a significant improvement in breathing and overall status when he got his pacer/AICD - no scheduled BD's for now, continue to consider this depending on sx - SABA prn - rov 1 yr or prn

## 2012-11-25 ENCOUNTER — Ambulatory Visit (INDEPENDENT_AMBULATORY_CARE_PROVIDER_SITE_OTHER): Payer: Medicare Other | Admitting: Internal Medicine

## 2012-11-25 ENCOUNTER — Encounter: Payer: Self-pay | Admitting: Internal Medicine

## 2012-11-25 VITALS — BP 132/80 | HR 60 | Ht 73.0 in | Wt 257.0 lb

## 2012-11-25 DIAGNOSIS — I10 Essential (primary) hypertension: Secondary | ICD-10-CM

## 2012-11-25 DIAGNOSIS — Z95810 Presence of automatic (implantable) cardiac defibrillator: Secondary | ICD-10-CM

## 2012-11-25 DIAGNOSIS — I4891 Unspecified atrial fibrillation: Secondary | ICD-10-CM

## 2012-11-25 DIAGNOSIS — I5022 Chronic systolic (congestive) heart failure: Secondary | ICD-10-CM

## 2012-11-25 DIAGNOSIS — I2589 Other forms of chronic ischemic heart disease: Secondary | ICD-10-CM

## 2012-11-25 LAB — ICD DEVICE OBSERVATION
AL THRESHOLD: 0.625 V
ATRIAL PACING ICD: 91 pct
BAMS-0003: 70 {beats}/min
DEV-0020ICD: NEGATIVE
DEVICE MODEL ICD: 1075181
FVT: 0
HV IMPEDENCE: 71 Ohm
LV LEAD IMPEDENCE ICD: 450 Ohm
PACEART VT: 0
RV LEAD AMPLITUDE: 12 mv
RV LEAD IMPEDENCE ICD: 487.5 Ohm
TOT-0008: 0
TOT-0009: 1
TOT-0010: 1
TZON-0005SLOWVT: 6
TZON-0010SLOWVT: 40 ms
VF: 0

## 2012-11-25 NOTE — Patient Instructions (Signed)
Your physician wants you to follow-up in: Sept 2014 with Dr [name] Bonita Quin will receive a reminder letter in the mail two months in advance. If you don't receive a letter, please call our office to schedule the follow-up appointment.  Remote monitoring is used to monitor your Pacemaker of ICD from home. This monitoring reduces the number of office visits required to check your device to one time per year. It allows [place] to keep an eye on the functioning of your device to ensure it is working properly. You are scheduled for a device check from home on 03/02/13. You may send your transmission at any time that day. If you have a wireless device, the transmission will be sent automatically. After your physician reviews your transmission, you will receive a postcard with your next transmission date.  Your physician has requested that you have an echocardiogram. Echocardiography is a painless test that uses sound waves to create images of your heart. It provides your doctor with information about the size and shape of your heart and how well your heart's chambers and valves are working. This procedure takes approximately one hour. There are no restrictions for this procedure.

## 2012-11-25 NOTE — Progress Notes (Signed)
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 male who presents today for routine electrophysiology followup.  Since his recent BiV ICD implantion, the patient reports doing very well.  He is very pleased with results.  He reports improved exercise tolerance.  His SOB and energy have improved.  Today, he denies symptoms of palpitations, chest pain,  lower extremity edema, dizziness, presyncope, syncope, or ICD shocks.  The patient is otherwise without complaint today.   Past Medical History  Diagnosis Date  . Atrial fibrillation     s/p prior DCCV;  Amiodarone Rx.  . CAD (coronary artery disease)     s/p CABG;  cath 10/07:  LM ok, LAD occluded, AV CFX 95%, pRCA occluded; L-LAD, S-OM2/OM3, S-PDA ok  . Cerebrovascular disease     s/p prior Left CEA;  followed by Dr. [name]  . COPD (chronic obstructive pulmonary disease)   . Hyperlipidemia   . Hypertension   . Mitral insufficiency     hx of. not noted on echo 01/2012  . Nephrolithiasis   . Ischemic cardiomyopathy     echo 5/09: EF 55%, mild LAE;  Myoview 11/10: inf and apical scar with mild peri-infarct ischemia, EF 44%. Echo 01/26/12 with EF 20-25% but previously known to be 30% on echoes before last one  . Iliac artery aneurysm, left     followed by Dr. [name]  . Aortic dissection     H/O focal aortic dissection  . LBBB (left bundle branch block)   . CHF (congestive heart failure)   . Myocardial infarction   . Renal insufficiency     01/2012   Past Surgical History  Procedure Date  . Uteroscopy   . Coronary artery bypass graft 1998  . Left cea 07/05  . Appendectomy 09/11/11  . Carotid endarterectomy 2005    left  . Biventricular defibrillator implantation 08/22/12    SJM Quadra Assura BiV ICD implanted by Dr [name]    Current Outpatient Prescriptions  Medication Sig Dispense Refill  . amiodarone (PACERONE) 200 MG tablet Take 1 tablet (200 mg total) by mouth daily.  90 tablet  4  .  amLODipine (NORVASC) 10 MG tablet Take 0.5 tablets (5 mg total) by mouth daily.  90 tablet  4  . furosemide (LASIX) 40 MG tablet Take 1 tablet (40 mg total) by mouth daily.  90 tablet  3  . hydrALAZINE (APRESOLINE) 50 MG tablet Take 25 mg by mouth 2 (two) times daily.       [name] lisinopril (PRINIVIL,ZESTRIL) 40 MG tablet Take 1 tablet (40 mg total) by mouth daily.  90 tablet  4  . metoprolol succinate (TOPROL-XL) 25 MG 24 hr tablet Take 0.5 tablets (12.5 mg total) by mouth daily.  90 tablet  4  . Multiple Vitamin (MULITIVITAMIN WITH MINERALS) TABS Take 1 tablet by mouth daily.      . niacin (NIASPAN) 500 MG CR tablet Take 500 mg by mouth 2 (two) times daily.       . nitroGLYCERIN (NITROSTAT) 0.4 MG SL tablet Place 0.4 mg under the tongue every 5 (five) minutes as needed. For chest pain      . potassium chloride SA (KLOR-CON M20) 20 MEQ tablet Take 40 mEq by mouth 3 (three) times daily.        . pravastatin (PRAVACHOL) 80 MG tablet Take 1 tablet (80 mg total) by mouth daily.  90 tablet  4  . warfarin (COUMADIN) 5 MG  tablet Take as directed by anticoagulation clinic  135 tablet  1    Physical Exam: Filed Vitals:   11/25/12 0955  BP: 132/80  Pulse: 60  Height: 6\' 1"  (1.854 m)  Weight: 257 lb (116.574 kg)    GEN- The patient is well appearing, alert and oriented x 3 today.   Head- normocephalic, atraumatic Eyes-  Sclera clear, conjunctiva pink Ears- hearing intact Oropharynx- clear Lungs- Clear to ausculation bilaterally, normal work of breathing Chest- ICD pocket is well healed Heart- Regular rate and rhythm, no murmurs, rubs or gallops, PMI not laterally displaced GI- soft, NT, ND, + BS Extremities- no clubbing, cyanosis, or edema  ICD interrogation- reviewed in detail today,  See PACEART report  Assessment and Plan:  1.  Chronic systolic dysfunction euvolemic today Clinically responsive to CRT.  He is >95% BiV paced Stable on an appropriate medical regimen Normal ICD  function See Pace Art report No changes today  Will need an echo in 2 months to assess improvement with CRT  2. Afib Maintaining sinus rhythm with amiodarone May be able to decrease amiodarone to 100mg  daily eventually.  Dr [name] to follow LFTs/TFTs Continue long term anticoagulation  3. HTN Stable No change required today

## 2012-12-13 ENCOUNTER — Ambulatory Visit (INDEPENDENT_AMBULATORY_CARE_PROVIDER_SITE_OTHER): Payer: Medicare Other

## 2012-12-13 DIAGNOSIS — Z7901 Long term (current) use of anticoagulants: Secondary | ICD-10-CM

## 2012-12-13 DIAGNOSIS — I4891 Unspecified atrial fibrillation: Secondary | ICD-10-CM

## 2013-01-13 ENCOUNTER — Emergency Department (HOSPITAL_COMMUNITY): Payer: Medicare Other

## 2013-01-13 ENCOUNTER — Inpatient Hospital Stay (HOSPITAL_COMMUNITY)
Admission: EM | Admit: 2013-01-13 | Discharge: 2013-01-15 | DRG: 066 | Disposition: A | Discharge status: Home / Self Care | Payer: Medicare Other | Attending: Internal Medicine | Admitting: Internal Medicine

## 2013-01-13 ENCOUNTER — Encounter (HOSPITAL_COMMUNITY): Payer: Self-pay | Admitting: *Deleted

## 2013-01-13 DIAGNOSIS — I4891 Unspecified atrial fibrillation: Secondary | ICD-10-CM

## 2013-01-13 DIAGNOSIS — Z79899 Other long term (current) drug therapy: Secondary | ICD-10-CM

## 2013-01-13 DIAGNOSIS — I6529 Occlusion and stenosis of unspecified carotid artery: Secondary | ICD-10-CM

## 2013-01-13 DIAGNOSIS — Z87891 Personal history of nicotine dependence: Secondary | ICD-10-CM

## 2013-01-13 DIAGNOSIS — I714 Abdominal aortic aneurysm, without rupture: Secondary | ICD-10-CM

## 2013-01-13 DIAGNOSIS — I2582 Chronic total occlusion of coronary artery: Secondary | ICD-10-CM | POA: Diagnosis present

## 2013-01-13 DIAGNOSIS — I059 Rheumatic mitral valve disease, unspecified: Secondary | ICD-10-CM

## 2013-01-13 DIAGNOSIS — R42 Dizziness and giddiness: Secondary | ICD-10-CM

## 2013-01-13 DIAGNOSIS — I739 Peripheral vascular disease, unspecified: Secondary | ICD-10-CM

## 2013-01-13 DIAGNOSIS — I723 Aneurysm of iliac artery: Secondary | ICD-10-CM

## 2013-01-13 DIAGNOSIS — R0600 Dyspnea, unspecified: Secondary | ICD-10-CM

## 2013-01-13 DIAGNOSIS — E7800 Pure hypercholesterolemia, unspecified: Secondary | ICD-10-CM

## 2013-01-13 DIAGNOSIS — Z95810 Presence of automatic (implantable) cardiac defibrillator: Secondary | ICD-10-CM

## 2013-01-13 DIAGNOSIS — I10 Essential (primary) hypertension: Secondary | ICD-10-CM

## 2013-01-13 DIAGNOSIS — R930 Abnormal findings on diagnostic imaging of skull and head, not elsewhere classified: Secondary | ICD-10-CM

## 2013-01-13 DIAGNOSIS — H532 Diplopia: Secondary | ICD-10-CM | POA: Diagnosis present

## 2013-01-13 DIAGNOSIS — E785 Hyperlipidemia, unspecified: Secondary | ICD-10-CM | POA: Diagnosis present

## 2013-01-13 DIAGNOSIS — I5022 Chronic systolic (congestive) heart failure: Secondary | ICD-10-CM

## 2013-01-13 DIAGNOSIS — I2589 Other forms of chronic ischemic heart disease: Secondary | ICD-10-CM

## 2013-01-13 DIAGNOSIS — Z951 Presence of aortocoronary bypass graft: Secondary | ICD-10-CM

## 2013-01-13 DIAGNOSIS — I4821 Permanent atrial fibrillation: Secondary | ICD-10-CM | POA: Diagnosis present

## 2013-01-13 DIAGNOSIS — R29898 Other symptoms and signs involving the musculoskeletal system: Secondary | ICD-10-CM | POA: Diagnosis present

## 2013-01-13 DIAGNOSIS — I6340 Cerebral infarction due to embolism of unspecified cerebral artery: Principal | ICD-10-CM

## 2013-01-13 DIAGNOSIS — Z7901 Long term (current) use of anticoagulants: Secondary | ICD-10-CM

## 2013-01-13 DIAGNOSIS — J449 Chronic obstructive pulmonary disease, unspecified: Secondary | ICD-10-CM

## 2013-01-13 DIAGNOSIS — I2510 Atherosclerotic heart disease of native coronary artery without angina pectoris: Secondary | ICD-10-CM | POA: Diagnosis present

## 2013-01-13 LAB — COMPREHENSIVE METABOLIC PANEL
ALT: 17 U/L (ref 0–53)
AST: 24 U/L (ref 0–37)
Albumin: 3.6 g/dL (ref 3.5–5.2)
Alkaline Phosphatase: 94 U/L (ref 39–117)
BUN: 15 mg/dL (ref 6–23)
CO2: 25 mEq/L (ref 19–32)
Calcium: 8.8 mg/dL (ref 8.4–10.5)
Chloride: 111 mEq/L (ref 96–112)
Creatinine, Ser: 1.21 mg/dL (ref 0.50–1.35)
GFR calc Af Amer: 68 mL/min — ABNORMAL LOW (ref >90)
GFR calc non Af Amer: 59 mL/min — ABNORMAL LOW (ref >90)
Glucose, Bld: 112 mg/dL — ABNORMAL HIGH (ref 70–99)
Potassium: 3.9 mEq/L (ref 3.5–5.1)
Sodium: 145 mEq/L (ref 135–145)
Total Bilirubin: 0.4 mg/dL (ref 0.3–1.2)
Total Protein: 6.5 g/dL (ref 6.0–8.3)

## 2013-01-13 LAB — POCT I-STAT, CHEM 8
BUN: 15 mg/dL (ref 6–23)
Calcium, Ion: 1.2 mmol/L (ref 1.13–1.30)
Chloride: 110 mEq/L (ref 96–112)
Creatinine, Ser: 1.4 mg/dL — ABNORMAL HIGH (ref 0.50–1.35)
Glucose, Bld: 110 mg/dL — ABNORMAL HIGH (ref 70–99)
HCT: 45 % (ref 39.0–52.0)
Hemoglobin: 15.3 g/dL (ref 13.0–17.0)
Potassium: 3.6 mEq/L (ref 3.5–5.1)
Sodium: 148 mEq/L — ABNORMAL HIGH (ref 135–145)
TCO2: 27 mmol/L (ref 0–100)

## 2013-01-13 LAB — DIFFERENTIAL
Basophils Absolute: 0.1 10*3/uL (ref 0.0–0.1)
Basophils Relative: 1 % (ref 0–1)
Eosinophils Absolute: 0.5 10*3/uL (ref 0.0–0.7)
Eosinophils Relative: 7 % — ABNORMAL HIGH (ref 0–5)
Lymphocytes Relative: 35 % (ref 12–46)
Lymphs Abs: 2.4 10*3/uL (ref 0.7–4.0)
Monocytes Absolute: 0.6 10*3/uL (ref 0.1–1.0)
Monocytes Relative: 8 % (ref 3–12)
Neutro Abs: 3.3 10*3/uL (ref 1.7–7.7)
Neutrophils Relative %: 49 % (ref 43–77)

## 2013-01-13 LAB — CBC
HCT: 45.7 % (ref 39.0–52.0)
Hemoglobin: 15 g/dL (ref 13.0–17.0)
MCH: 30.7 pg (ref 26.0–34.0)
MCHC: 32.8 g/dL (ref 30.0–36.0)
MCV: 93.6 fL (ref 78.0–100.0)
Platelets: 139 10*3/uL — ABNORMAL LOW (ref 150–400)
RBC: 4.88 MIL/uL (ref 4.22–5.81)
RDW: 14.8 % (ref 11.5–15.5)
WBC: 6.8 10*3/uL (ref 4.0–10.5)

## 2013-01-13 LAB — POCT I-STAT TROPONIN I: Troponin i, poc: 0 ng/mL (ref 0.00–0.08)

## 2013-01-13 LAB — APTT: aPTT: 42 seconds — ABNORMAL HIGH (ref 24–37)

## 2013-01-13 LAB — TROPONIN I: Troponin I: <0.3 ng/mL (ref <0.30)

## 2013-01-13 LAB — PROTIME-INR
INR: 2.1 — ABNORMAL HIGH (ref 0.00–1.49)
Prothrombin Time: 22.7 seconds — ABNORMAL HIGH (ref 11.6–15.2)

## 2013-01-13 LAB — GLUCOSE, CAPILLARY: Glucose-Capillary: 105 mg/dL — ABNORMAL HIGH (ref 70–99)

## 2013-01-13 LAB — ETHANOL: Alcohol, Ethyl (B): <11 mg/dL (ref 0–11)

## 2013-01-13 MED ORDER — FUROSEMIDE 40 MG PO TABS
40.0000 mg | ORAL_TABLET | Freq: Every day | ORAL | Status: DC
  Administered 2013-01-14: 40 mg via ORAL
  Filled 2013-01-13 (×2): qty 1

## 2013-01-13 MED ORDER — ADULT MULTIVITAMIN W/MINERALS CH
1.0000 | ORAL_TABLET | Freq: Every day | ORAL | Status: DC
  Administered 2013-01-14 – 2013-01-15 (×2): 1 via ORAL
  Filled 2013-01-13 (×2): qty 1

## 2013-01-13 MED ORDER — AMIODARONE HCL 200 MG PO TABS
200.0000 mg | ORAL_TABLET | Freq: Every day | ORAL | Status: DC
  Administered 2013-01-14 – 2013-01-15 (×2): 200 mg via ORAL
  Filled 2013-01-13 (×2): qty 1

## 2013-01-13 MED ORDER — SIMVASTATIN 40 MG PO TABS
40.0000 mg | ORAL_TABLET | Freq: Every day | ORAL | Status: DC
  Administered 2013-01-14: 40 mg via ORAL
  Filled 2013-01-13 (×2): qty 1

## 2013-01-13 MED ORDER — NIACIN ER 500 MG PO CPCR
500.0000 mg | ORAL_CAPSULE | Freq: Two times a day (BID) | ORAL | Status: DC
  Administered 2013-01-13 – 2013-01-15 (×4): 500 mg via ORAL
  Filled 2013-01-13 (×5): qty 1

## 2013-01-13 MED ORDER — WARFARIN SODIUM 5 MG PO TABS
5.0000 mg | ORAL_TABLET | ORAL | Status: DC
  Filled 2013-01-13: qty 1

## 2013-01-13 MED ORDER — WARFARIN SODIUM 7.5 MG PO TABS: 7.5000 mg | ORAL_TABLET | ORAL | Status: DC

## 2013-01-13 MED ORDER — POTASSIUM CHLORIDE CRYS ER 20 MEQ PO TBCR
40.0000 meq | EXTENDED_RELEASE_TABLET | Freq: Three times a day (TID) | ORAL | Status: DC
  Administered 2013-01-13 – 2013-01-14 (×4): 40 meq via ORAL
  Filled 2013-01-13 (×6): qty 2

## 2013-01-13 MED ORDER — WARFARIN - PHARMACIST DOSING INPATIENT: Freq: Every day | Status: DC

## 2013-01-13 NOTE — Consult Note (Addendum)
Referring Physician: Juleen China    Chief Complaint: Left sided weakness, diplopia  HPI: [name] [name] is an 72 y.o. male who was fine when he sat down with his wife today.  A few minutes after sitting down he began to experience dizziness and double vision.  These symptoms have been intermittent.  At some point in time he began to experience left sided numbness and weakness although he does not know when this started.  Patient was brought in for evaluation at that time and code stroke was called.    LSN: [id number] tPA Given: No: On Coumadin with a therapeutic INR  Past Medical History  Diagnosis Date  . Atrial fibrillation     s/p prior DCCV;  Amiodarone Rx.  . CAD (coronary artery disease)     s/p CABG;  cath 10/07:  LM ok, LAD occluded, AV CFX 95%, pRCA occluded; L-LAD, S-OM2/OM3, S-PDA ok  . Cerebrovascular disease     s/p prior Left CEA;  followed by Dr. [name]  . COPD (chronic obstructive pulmonary disease)   . Hyperlipidemia   . Hypertension   . Mitral insufficiency     hx of. not noted on echo 01/2012  . Nephrolithiasis   . Ischemic cardiomyopathy     echo 5/09: EF 55%, mild LAE;  Myoview 11/10: inf and apical scar with mild peri-infarct ischemia, EF 44%. Echo 01/26/12 with EF 20-25% but previously known to be 30% on echoes before last one  . Iliac artery aneurysm, left     followed by Dr. [name]  . Aortic dissection     H/O focal aortic dissection  . LBBB (left bundle branch block)   . CHF (congestive heart failure)   . Myocardial infarction   . Renal insufficiency     01/2012    Past Surgical History  Procedure Date  . Uteroscopy   . Coronary artery bypass graft 1998  . Left cea 07/05  . Appendectomy 09/11/11  . Carotid endarterectomy 2005    left  . Biventricular defibrillator implantation 08/22/12    SJM Quadra Assura BiV ICD implanted by Dr [name]    Family History  Problem Relation Age of Onset  . Stroke Maternal Uncle    Social History:  reports that he quit  smoking about 8 years ago. His smoking use included Cigarettes. He has a 85.5 pack-year smoking history. He has never used smokeless tobacco. He reports that he does not drink alcohol or use illicit drugs.  Allergies: No Known Allergies  Medications: I have reviewed the patient's current medications. Prior to Admission:  Current outpatient prescriptions: amiodarone (PACERONE) 200 MG tablet, Take 1 tablet (200 mg total) by mouth daily., Disp: 90 tablet, Rfl: 4;   amLODipine (NORVASC) 10 MG tablet, Take 0.5 tablets (5 mg total) by mouth daily., Disp: 90 tablet, Rfl: 4;   furosemide (LASIX) 40 MG tablet, Take 1 tablet (40 mg total) by mouth daily., Disp: 90 tablet, Rfl: 3;   hydrALAZINE (APRESOLINE) 50 MG tablet, Take 25 mg by mouth 2 (two) times daily. , Disp: , Rfl:  lisinopril (PRINIVIL,ZESTRIL) 40 MG tablet, Take 1 tablet (40 mg total) by mouth daily., Disp: 90 tablet, Rfl: 4;   metoprolol succinate (TOPROL-XL) 25 MG 24 hr tablet, Take 0.5 tablets (12.5 mg total) by mouth daily., Disp: 90 tablet, Rfl: 4;   Multiple Vitamin (MULITIVITAMIN WITH MINERALS) TABS, Take 1 tablet by mouth daily., Disp: , Rfl: ;   niacin (NIASPAN) 500 MG CR tablet, Take 500 mg by  mouth 2 (two) times daily. , Disp: , Rfl:  nitroGLYCERIN (NITROSTAT) 0.4 MG SL tablet, Place 0.4 mg under the tongue every 5 (five) minutes as needed. For chest pain, Disp: , Rfl: ;   potassium chloride SA (KLOR-CON M20) 20 MEQ tablet, Take 40 mEq by mouth 3 (three) times daily.  , Disp: , Rfl: ;   pravastatin (PRAVACHOL) 80 MG tablet, Take 1 tablet (80 mg total) by mouth daily., Disp: 90 tablet, Rfl: 4 warfarin (COUMADIN) 5 MG tablet, Take 5-7.5 mg by mouth daily. 1.5 tabs (7.5 mg) m,w,f and 1 tab (5mg )  all other days, Disp: , Rfl:   ROS: History obtained from the patient  General ROS: negative for - chills, fatigue, fever, night sweats, weight gain or weight loss Psychological ROS: negative for - behavioral disorder, hallucinations,  memory difficulties, mood swings or suicidal ideation Ophthalmic ROS: as noted in HPI ENT ROS: negative for - epistaxis, nasal discharge, oral lesions, sore throat, tinnitus Allergy and Immunology ROS: negative for - hives or itchy/watery eyes Hematological and Lymphatic ROS: negative for - bleeding problems, bruising or swollen lymph nodes Endocrine ROS: negative for - galactorrhea, hair pattern changes, polydipsia/polyuria or temperature intolerance Respiratory ROS: negative for - cough, hemoptysis, shortness of breath or wheezing Cardiovascular ROS: negative for - chest pain, dyspnea on exertion, edema or irregular heartbeat Gastrointestinal ROS: negative for - abdominal pain, diarrhea, hematemesis, nausea/vomiting or stool incontinence Genito-Urinary ROS: negative for - dysuria, hematuria, incontinence or urinary frequency/urgency Musculoskeletal ROS: negative for - joint swelling or muscular weakness Neurological ROS: as noted in HPI Dermatological ROS: negative for rash and skin lesion changes  Physical Examination: Blood pressure 161/76, pulse 60, temperature 98.3 F (36.8 C), temperature source Oral, resp. rate 18, height 6' 0.84" (1.85 m), weight 116.6 kg (257 lb 0.9 oz), SpO2 97.00%.  Neurologic Examination: Mental Status: Alert, oriented, thought content appropriate.  Speech fluent without evidence of aphasia.  Able to follow 3 step commands without difficulty.  Left neglect. Cranial Nerves: II: Discs flat bilaterally; Constriction of peripheral vision to the left.  Pupils equal, round, reactive to light and accommodation III,IV, VI: ptosis not present, extra-ocular motions intact bilaterally.  Currently with testing no diplopia V,VII: decrease in right NLF, facial light touch sensation decreased on the right side of the face VIII: hearing normal bilaterally IX,X: gag reflex present XI: bilateral shoulder shrug XII: midline tongue extension Motor: Right : Upper extremity    5/5    Left:     Upper extremity   5-/5  Lower extremity   5/5     Lower extremity   5-/5 Tone and bulk:normal tone throughout; no atrophy noted Sensory: Pinprick and light touch decreased on the left Deep Tendon Reflexes: 2+ with 1+ AJ's bilaterally Plantars: Right: downgoing   Left: downgoing Cerebellar: normal finger-to-nose and normal heel-to-shin test Gait: not tested CV: pulses palpable throughout   Laboratory Studies:  Basic Metabolic Panel: No results found for this basename: NA:5,K:5,CL:5,CO2:5,GLUCOSE:5,BUN:5,CREATININE:5,CALCIUM:3,MG:5,PHOS:5 in the last 168 hours  Liver Function Tests: No results found for this basename: AST:5,ALT:5,ALKPHOS:5,BILITOT:5,PROT:5,ALBUMIN:5 in the last 168 hours No results found for this basename: LIPASE:5,AMYLASE:5 in the last 168 hours No results found for this basename: AMMONIA:3 in the last 168 hours  CBC: No results found for this basename: WBC:5,NEUTROABS:5,HGB:5,HCT:5,MCV:5,PLT:5 in the last 168 hours  Cardiac Enzymes: No results found for this basename: CKTOTAL:5,CKMB:5,CKMBINDEX:5,TROPONINI:5 in the last 168 hours  BNP: No components found with this basename: POCBNP:5  CBG:  Lab 01/13/13 1919  GLUCAP 105*    Microbiology: Results for orders placed during the hospital encounter of 08/22/12  SURGICAL PCR SCREEN     Status: Normal   Collection Time   08/22/12  6:48 AM      Component Value Range Status Comment   MRSA, PCR NEGATIVE  NEGATIVE Final    Staphylococcus aureus NEGATIVE  NEGATIVE Final     Coagulation Studies: No results found for this basename: LABPROT:5,INR:5 in the last 72 hours  Urinalysis: No results found for this basename: COLORURINE:2,APPERANCEUR:2,LABSPEC:2,PHURINE:2,GLUCOSEU:2,HGBUR:2,BILIRUBINUR:2,KETONESUR:2,PROTEINUR:2,UROBILINOGEN:2,NITRITE:2,LEUKOCYTESUR:2 in the last 168 hours  Lipid Panel:    Component Value Date/Time   CHOL 129 07/16/2012 0919   TRIG 148.0 07/16/2012 0919   HDL 40.60 07/16/2012  0919   CHOLHDL 3 07/16/2012 0919   VLDL 29.6 07/16/2012 0919   LDLCALC 59 07/16/2012 0919    HgbA1C:  Lab Results  Component Value Date   HGBA1C 5.9* 07/09/2011    Urine Drug Screen:   No results found for this basename: labopia, cocainscrnur, labbenz, amphetmu, thcu, labbarb    Alcohol Level: No results found for this basename: ETH:2 in the last 168 hours  Imaging: Ct Head Wo Contrast  01/13/2013  *RADIOLOGY REPORT*  Clinical Data: Double vision, left arm droop and left arm weakness. Atrial fibrillation, high blood pressure and hyperlipidemia.  CT HEAD WITHOUT CONTRAST  Technique:  Contiguous axial images were obtained from the base of the skull through the vertex without contrast.  Comparison: 07/09/2011.  Findings: No intracranial hemorrhage.  Prominent small vessel disease type changes without CT evidence of large acute infarct.  Difficult to exclude small acute infarct in this setting.  One area which appears new from the prior examination is a small right posterior frontal lobe infarct of indeterminate age (series 2 image 20).  The right middle cerebral artery branch vessels appear slightly hyperdense on the prior exam (series 2 image 15).  Intracranial atherosclerotic type changes.  Global atrophy without hydrocephalus.  No intracranial mass lesion detected on this unenhanced exam.  Left frontal sinus, ethmoid sinus air cells, maxillary sinuses and the right sphenoid sinus partial opacification with polypoid appearance in the sphenoid sinus and maxillary sinus region.  IMPRESSION: No intracranial hemorrhage.  Prominent small vessel disease type changes without CT evidence of large acute infarct.  Difficult to exclude small acute infarct in this setting.  One area which appears new from the prior examination is a small right posterior frontal lobe infarct of indeterminate age (series 2 image 20).  Paranasal sinus opacification as noted above.  This is a code stroke with a call into Dr. Thad Ranger  01/13/2013 7:20 p.m.   Original Report Authenticated By: Lacy Duverney, M.D.     Assessment: 72 y.o. male presenting with complaints of diplopia, decreased in left visual field, left sided weakness/ numbness and neglect.  Symptoms actually suggest multiple vascular territories.  Patient on Coumadin with a history of atrial fibrillation.  Last INR about 3 weeks ago per wife and was within therapeutic range at that time.  CT of the head performed and shows no evidence of hemorrhage or other acute changes.  Unable to have MRI secondary to pacer.  INR therapeutic and therefore not a tPA candidate.     Stroke Risk Factors - atrial fibrillation, hyperlipidemia, hypertension and CAD  Plan: 1. HgbA1c, fasting lipid panel 2. PT consult, OT consult, Speech consult 3. Echocardiogram 4. Carotid dopplers 5. Prophylactic therapy-Anticoagulation: Coumadin- home dose to be continued 6. Risk factor modification 7. Telemetry monitoring 8. Frequent  neuro checks  Case discussed with Dr. Alverda Skeans, MD [facility] [phone] 01/13/2013, 7:31 PM

## 2013-01-13 NOTE — Progress Notes (Signed)
ANTICOAGULATION CONSULT NOTE - Initial Consult  Pharmacy Consult for Coumadin Indication: atrial fibrillation  No Known Allergies  Patient Measurements: Height: 6' 0.83" (185 cm) Weight: 257 lb 0.9 oz (116.6 kg) IBW/kg (Calculated) : 79.52   Vital Signs: Temp: 97.8 F (36.6 C) (02/03 2138) Temp src: Oral (02/03 1926) BP: 161/76 mmHg (02/03 1926) Pulse Rate: 60  (02/03 1926)  Labs:  Basename 01/13/13 1937 01/13/13 1921  HGB 15.3 15.0  HCT 45.0 45.7  PLT -- 139*  APTT -- 42*  LABPROT -- 22.7*  INR -- 2.10*  HEPARINUNFRC -- --  CREATININE 1.40* 1.21  CKTOTAL -- --  CKMB -- --  TROPONINI -- <0.30    Estimated Creatinine Clearance: 65.5 ml/min (by C-G formula based on Cr of 1.4).   Medical History: Past Medical History  Diagnosis Date  . Atrial fibrillation     s/p prior DCCV;  Amiodarone Rx.  . CAD (coronary artery disease)     s/p CABG;  cath 10/07:  LM ok, LAD occluded, AV CFX 95%, pRCA occluded; L-LAD, S-OM2/OM3, S-PDA ok  . Cerebrovascular disease     s/p prior Left CEA;  followed by Dr. [name]  . COPD (chronic obstructive pulmonary disease)   . Hyperlipidemia   . Hypertension   . Mitral insufficiency     hx of. not noted on echo 01/2012  . Nephrolithiasis   . Ischemic cardiomyopathy     echo 5/09: EF 55%, mild LAE;  Myoview 11/10: inf and apical scar with mild peri-infarct ischemia, EF 44%. Echo 01/26/12 with EF 20-25% but previously known to be 30% on echoes before last one  . Iliac artery aneurysm, left     followed by Dr. [name]  . Aortic dissection     H/O focal aortic dissection  . LBBB (left bundle branch block)   . CHF (congestive heart failure)   . Myocardial infarction   . Renal insufficiency     01/2012    Medications:   Scheduled:    . amiodarone  200 mg Oral Daily  . furosemide  40 mg Oral Daily  . multivitamin with minerals  1 tablet Oral Daily  . niacin  500 mg Oral BID  . potassium chloride SA  40 mEq Oral TID  . simvastatin  40 mg  Oral q1800  . warfarin  5 mg Oral Q T,Th,S,Su-1800  . warfarin  7.5 mg Oral Q M,W,F-1800  . Warfarin - Pharmacist Dosing Inpatient   Does not apply q1800    Assessment: 72yo male admitted for CVA workup, initial CT negative, not tPA candidate d/t Coumadin use, to continue Coumadin; admitted with therapeutic INR.  Goal of Therapy:  INR 2-3   Plan:  Will continue home Coumadin dose of 5mg  TTSS and 7.5mg  MWF and monitor INR.  Colleen Can PharmD BCPS 01/13/2013,11:45 PM

## 2013-01-13 NOTE — ED Notes (Signed)
Stroke swallow screen given and passed without any problems

## 2013-01-13 NOTE — ED Notes (Signed)
Pt remains alert and oriented x's 3 with family at bedside.  No complaints voiced at this time

## 2013-01-13 NOTE — ED Notes (Signed)
Code Stroke cancelled per Dr. Thad Ranger

## 2013-01-13 NOTE — ED Provider Notes (Signed)
History     CSN: [id number]  Arrival date & time 01/13/13  1324   First MD Initiated Contact with Patient 01/13/13 1839      Chief Complaint  Patient presents with  . Arm Pain  . Weakness    (Consider location/radiation/quality/duration/timing/severity/associated sxs/prior treatment) Patient is a 72 y.o. male presenting with neurologic complaint. The history is provided by the patient.  Neurologic Problem The primary symptoms include dizziness, visual change, paresthesias and focal weakness. Primary symptoms do not include headaches, fever, nausea or vomiting. The symptoms began 2 to 6 hours ago. The symptoms are unchanged. The neurological symptoms are multifocal. Context: spontaneous.  Dizziness also occurs with weakness. Dizziness does not occur with nausea or vomiting.   The visual change began today. The visual change is a new problem. The visual change has been unchanged since its onset.The visual change is located in the entire field of vision of the left eye. The visual change is located in the entire field of vision of the right eye. The visual change includes double vision. The visual change does not include photophobia.  Paresthesias began 3 - 6 hours ago. The paresthesias are unchanged. Affected locations include the: left upper arm, left distal leg and left thigh.  Weakness began 3 - 6 hours ago. The weakness is unchanged. Region/motion of weakness: left arm and left leg.  Additional symptoms include weakness and loss of balance. Additional symptoms do not include neck stiffness or photophobia.    Past Medical History  Diagnosis Date  . Atrial fibrillation     s/p prior DCCV;  Amiodarone Rx.  . CAD (coronary artery disease)     s/p CABG;  cath 10/07:  LM ok, LAD occluded, AV CFX 95%, pRCA occluded; L-LAD, S-OM2/OM3, S-PDA ok  . Cerebrovascular disease     s/p prior Left CEA;  followed by Dr. [name]  . COPD (chronic obstructive pulmonary disease)   . Hyperlipidemia   .  Hypertension   . Mitral insufficiency     hx of. not noted on echo 01/2012  . Nephrolithiasis   . Ischemic cardiomyopathy     echo 5/09: EF 55%, mild LAE;  Myoview 11/10: inf and apical scar with mild peri-infarct ischemia, EF 44%. Echo 01/26/12 with EF 20-25% but previously known to be 30% on echoes before last one  . Iliac artery aneurysm, left     followed by Dr. [name]  . Aortic dissection     H/O focal aortic dissection  . LBBB (left bundle branch block)   . CHF (congestive heart failure)   . Myocardial infarction   . Renal insufficiency     01/2012    Past Surgical History  Procedure Date  . Uteroscopy   . Coronary artery bypass graft 1998  . Left cea 07/05  . Appendectomy 09/11/11  . Carotid endarterectomy 2005    left  . Biventricular defibrillator implantation 08/22/12    SJM Quadra Assura BiV ICD implanted by Dr [name]    Family History  Problem Relation Age of Onset  . Stroke Maternal Uncle     History  Substance Use Topics  . Smoking status: Former Smoker -- 1.5 packs/day for 57 years    Types: Cigarettes    Quit date: 12/11/2004  . Smokeless tobacco: Never Used  . Alcohol Use: No      Review of Systems  Constitutional: Negative for fever and fatigue.  HENT: Negative for congestion, rhinorrhea, neck stiffness and postnasal drip.   Eyes: Positive  for double vision and visual disturbance. Negative for photophobia.  Respiratory: Negative for chest tightness, shortness of breath and wheezing.   Cardiovascular: Negative for chest pain, palpitations and leg swelling.  Gastrointestinal: Negative for nausea, vomiting, abdominal pain and diarrhea.  Genitourinary: Negative for urgency, frequency and difficulty urinating.  Musculoskeletal: Negative for back pain and arthralgias.  Skin: Negative for rash and wound.  Neurological: Positive for dizziness, focal weakness, weakness, numbness, paresthesias and loss of balance. Negative for headaches.   Psychiatric/Behavioral: Negative for confusion and agitation.    Allergies  Review of patient's allergies indicates no known allergies.  Home Medications   No current outpatient prescriptions on file.  BP 168/88  Pulse 62  Temp 97.5 F (36.4 C) (Oral)  Resp 20  Ht 6\' 1"  (1.854 m)  Wt 254 lb (115.214 kg)  BMI 33.51 kg/m2  SpO2 94%  Physical Exam  Nursing note and vitals reviewed. Constitutional: He is oriented to person, place, and time. He appears well-developed and well-nourished. No distress.  HENT:  Head: Normocephalic and atraumatic.  Mouth/Throat: Oropharynx is clear and moist.  Eyes: EOM are normal. Pupils are equal, round, and reactive to light. Right eye exhibits normal extraocular motion and no nystagmus. Left eye exhibits normal extraocular motion and no nystagmus.       Unable to perform eom exam due to dizziness  Neck: Normal range of motion. Neck supple.  Cardiovascular: Normal rate, regular rhythm, normal heart sounds and intact distal pulses.   Pulmonary/Chest: Effort normal and breath sounds normal. He has no wheezes. He has no rales.  Abdominal: Soft. Bowel sounds are normal. He exhibits no distension. There is no tenderness. There is no rebound and no guarding.  Musculoskeletal: Normal range of motion. He exhibits no edema and no tenderness.  Lymphadenopathy:    He has no cervical adenopathy.  Neurological: He is alert and oriented to person, place, and time. He displays normal reflexes. No cranial nerve deficit or sensory deficit. He exhibits normal muscle tone. Coordination normal. GCS eye subscore is 4. GCS verbal subscore is 5. GCS motor subscore is 6.       4/5 left upper and lower extremity weakness  Skin: Skin is warm and dry. No rash noted.  Psychiatric: He has a normal mood and affect. His behavior is normal.    ED Course  Procedures (including critical care time)  Labs Reviewed  PROTIME-INR - Abnormal; Notable for the following:     Prothrombin Time 22.7 (*)     INR 2.10 (*)     All other components within normal limits  APTT - Abnormal; Notable for the following:    aPTT 42 (*)     All other components within normal limits  CBC - Abnormal; Notable for the following:    Platelets 139 (*)     All other components within normal limits  DIFFERENTIAL - Abnormal; Notable for the following:    Eosinophils Relative 7 (*)     All other components within normal limits  COMPREHENSIVE METABOLIC PANEL - Abnormal; Notable for the following:    Glucose, Bld 112 (*)     GFR calc non Af Amer 59 (*)     GFR calc Af Amer 68 (*)     All other components within normal limits  GLUCOSE, CAPILLARY - Abnormal; Notable for the following:    Glucose-Capillary 105 (*)     All other components within normal limits  POCT I-STAT, CHEM 8 - Abnormal; Notable for the  following:    Sodium 148 (*)     Creatinine, Ser 1.40 (*)     Glucose, Bld 110 (*)     All other components within normal limits  ETHANOL  TROPONIN I  POCT I-STAT TROPONIN I  URINE RAPID DRUG SCREEN (HOSP PERFORMED)  URINALYSIS, ROUTINE W REFLEX MICROSCOPIC  HEMOGLOBIN A1C  LIPID PANEL  PROTIME-INR   Ct Head Wo Contrast  01/13/2013  *RADIOLOGY REPORT*  Clinical Data: Double vision, left arm droop and left arm weakness. Atrial fibrillation, high blood pressure and hyperlipidemia.  CT HEAD WITHOUT CONTRAST  Technique:  Contiguous axial images were obtained from the base of the skull through the vertex without contrast.  Comparison: 07/09/2011.  Findings: No intracranial hemorrhage.  Prominent small vessel disease type changes without CT evidence of large acute infarct.  Difficult to exclude small acute infarct in this setting.  One area which appears new from the prior examination is a small right posterior frontal lobe infarct of indeterminate age (series 2 image 20).  The right middle cerebral artery branch vessels appear slightly hyperdense on the prior exam (series 2 image 15).   Intracranial atherosclerotic type changes.  Global atrophy without hydrocephalus.  No intracranial mass lesion detected on this unenhanced exam.  Left frontal sinus, ethmoid sinus air cells, maxillary sinuses and the right sphenoid sinus partial opacification with polypoid appearance in the sphenoid sinus and maxillary sinus region.  IMPRESSION: No intracranial hemorrhage.  Prominent small vessel disease type changes without CT evidence of large acute infarct.  Difficult to exclude small acute infarct in this setting.  One area which appears new from the prior examination is a small right posterior frontal lobe infarct of indeterminate age (series 2 image 20).  Paranasal sinus opacification as noted above.  This is a code stroke with a call into Dr. Thad Ranger 01/13/2013 7:20 p.m.   Original Report Authenticated By: Lacy Duverney, M.D.      1. Cerebral embolism with cerebral infarction   2. Atrial fibrillation   3. Essential hypertension, benign       MDM  47M who developed acute onset dizziness, double vision, and left sided weakness at 1630 (2.5 hours prior to arrival) this afternoon. The diplopia was intermittent but the left-sided weakness was constant. Due to his symptoms he presented to the ED and code stroke was called.  In the ED, CT head was negative, but the patient was ruled out as a candidate for TPA due to being on coumadin (for A.Fib) and having a theraputic INR. 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         Johnnette Gourd, MD 01/14/13 0010

## 2013-01-13 NOTE — ED Notes (Signed)
Unable to obtain MRI due to pt having a pacemaker.  Code stroke was cancelled at 20:01

## 2013-01-13 NOTE — ED Notes (Signed)
To ED for eval of double vision, left arm weakness, and right face intermittent droop since this afternoon. Pt has cardiac hx.

## 2013-01-13 NOTE — H&P (Addendum)
[facility] History and Physical  [name] [name] WJX:[id number] DOB: 08-30-1941 DOA: 01/13/2013  Referring physician: ED PCP: [name], MD  Specialists:   Chief Complaint: Diplopia, L sided weakness  HPI: [name] [name] is a 72 y.o. male who developed acute onset dizziness, double vision, at 1630 this afternoon.  Symptoms have been intermittent and at some point in time he also began to develop L sided numbness and weakness although hes not sure when this started and thankfully this has resolved by the point in time he is being admitted.  Due to his symptoms he presented to the ED and code stroke was called.  In the ED, CT head was negative, but the patient was ruled out as a candidate for TPA due to being on coumadin (for A.Fib) and having a theraputic INR.  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  Review of Systems: 12 systems reviewed and otherwise negative.  Past Medical History  Diagnosis Date  . Atrial fibrillation     s/p prior DCCV;  Amiodarone Rx.  . CAD (coronary artery disease)     s/p CABG;  cath 10/07:  LM ok, LAD occluded, AV CFX 95%, pRCA occluded; L-LAD, S-OM2/OM3, S-PDA ok  . Cerebrovascular disease     s/p prior Left CEA;  followed by Dr. [name]  . COPD (chronic obstructive pulmonary disease)   . Hyperlipidemia   . Hypertension   . Mitral insufficiency     hx of. not noted on echo 01/2012  . Nephrolithiasis   . Ischemic cardiomyopathy     echo 5/09: EF 55%, mild LAE;  Myoview 11/10: inf and apical scar with mild peri-infarct ischemia, EF 44%. Echo 01/26/12 with EF 20-25% but previously known to be 30% on echoes before last one  . Iliac artery aneurysm, left     followed by Dr. [name]  . Aortic dissection     H/O focal aortic dissection  . LBBB (left bundle branch block)   . CHF (congestive heart failure)   . Myocardial infarction   . Renal insufficiency      01/2012   Past Surgical History  Procedure Date  . Uteroscopy   . Coronary artery bypass graft 1998  . Left cea 07/05  . Appendectomy 09/11/11  . Carotid endarterectomy 2005    left  . Biventricular defibrillator implantation 08/22/12    SJM Quadra Assura BiV ICD implanted by Dr [name]   Social History:  reports that he quit smoking about 8 years ago. His smoking use included Cigarettes. He has a 85.5 pack-year smoking history. He has never used smokeless tobacco. He reports that he does not drink alcohol or use illicit drugs.   No Known Allergies  Family History  Problem Relation Age of Onset  . Stroke Maternal Uncle      Prior to Admission medications   Medication Sig Start Date End Date Taking? Authorizing Provider  amiodarone (PACERONE) 200 MG tablet Take 1 tablet (200 mg total) by mouth daily. 07/05/12  Yes Lewayne Bunting, MD  amLODipine (NORVASC) 10 MG tablet Take 0.5 tablets (5 mg total) by mouth daily. 07/05/12  Yes Lewayne Bunting, MD  furosemide (LASIX) 40 MG tablet Take 1 tablet (40 mg total) by mouth daily. 05/28/12  Yes Lewayne Bunting, MD  hydrALAZINE (APRESOLINE) 50 MG tablet Take 25 mg by mouth 2 (two) times daily.  02/15/12  Yes  Lewayne Bunting, MD  lisinopril (PRINIVIL,ZESTRIL) 40 MG tablet Take 1 tablet (40 mg total) by mouth daily. 07/05/12  Yes Lewayne Bunting, MD  metoprolol succinate (TOPROL-XL) 25 MG 24 hr tablet Take 0.5 tablets (12.5 mg total) by mouth daily. 10/18/12  Yes Lewayne Bunting, MD  Multiple Vitamin (MULITIVITAMIN WITH MINERALS) TABS Take 1 tablet by mouth daily.   Yes Historical Provider, MD  niacin (NIASPAN) 500 MG CR tablet Take 500 mg by mouth 2 (two) times daily.    Yes Historical Provider, MD  nitroGLYCERIN (NITROSTAT) 0.4 MG SL tablet Place 0.4 mg under the tongue every 5 (five) minutes as needed. For chest pain   Yes Historical Provider, MD  potassium chloride SA (KLOR-CON M20) 20 MEQ tablet Take 40 mEq by mouth 3 (three) times daily.      Yes Historical Provider, MD  pravastatin (PRAVACHOL) 80 MG tablet Take 1 tablet (80 mg total) by mouth daily. 07/05/12  Yes Lewayne Bunting, MD  warfarin (COUMADIN) 5 MG tablet Take 5-7.5 mg by mouth daily. 1.5 tabs (7.5 mg) m,w,f and 1 tab (5mg )  all other days   Yes Historical Provider, MD   Physical Exam: Filed Vitals:   01/13/13 1926 01/13/13 2138  BP: 161/76   Pulse: 60   Temp: 98.3 F (36.8 C) 97.8 F (36.6 C)  TempSrc: Oral   Resp: 18   Height: 6' 0.83" (1.85 m)   Weight: 116.6 kg (257 lb 0.9 oz)   SpO2: 97%      General:  NAD, resting comfortably in bed  Eyes: PEERLA EOMI, no diploplia on testing at this time  ENT: mucous membranes moist  Neck: supple w/o JVD  Cardiovascular: RRR w/o MRG  Respiratory: CTA B  Abdomen: soft, nt, nd, bs+  Skin: no rash nor lesion  Musculoskeletal: MAE, full ROM all 4 extremities  Psychiatric: normal tone and affect  Neurologic: AAOx3, apparent constriction of L visual field on exam but no diplopia, motor exam is non-focal   Labs on Admission:  Basic Metabolic Panel:  Lab 01/13/13 1610 01/13/13 1921  NA 148* 145  K 3.6 3.9  CL 110 111  CO2 -- 25  GLUCOSE 110* 112*  BUN 15 15  CREATININE 1.40* 1.21  CALCIUM -- 8.8  MG -- --  PHOS -- --   Liver Function Tests:  Lab 01/13/13 1921  AST 24  ALT 17  ALKPHOS 94  BILITOT 0.4  PROT 6.5  ALBUMIN 3.6   No results found for this basename: LIPASE:5,AMYLASE:5 in the last 168 hours No results found for this basename: AMMONIA:5 in the last 168 hours CBC:  Lab 01/13/13 1937 01/13/13 1921  WBC -- 6.8  NEUTROABS -- 3.3  HGB 15.3 15.0  HCT 45.0 45.7  MCV -- 93.6  PLT -- 139*   Cardiac Enzymes:  Lab 01/13/13 1921  CKTOTAL --  CKMB --  CKMBINDEX --  TROPONINI <0.30    BNP (last 3 results)  Basename 01/27/12 0630 01/25/12 1416  PROBNP 288.9* 693.8*   CBG:  Lab 01/13/13 1919  GLUCAP 105*    Radiological Exams on Admission: Ct Head Wo  Contrast  01/13/2013  *RADIOLOGY REPORT*  Clinical Data: Double vision, left arm droop and left arm weakness. Atrial fibrillation, high blood pressure and hyperlipidemia.  CT HEAD WITHOUT CONTRAST  Technique:  Contiguous axial images were obtained from the base of the skull through the vertex without contrast.  Comparison: 07/09/2011.  Findings: No intracranial hemorrhage.  Prominent  small vessel disease type changes without CT evidence of large acute infarct.  Difficult to exclude small acute infarct in this setting.  One area which appears new from the prior examination is a small right posterior frontal lobe infarct of indeterminate age (series 2 image 20).  The right middle cerebral artery branch vessels appear slightly hyperdense on the prior exam (series 2 image 15).  Intracranial atherosclerotic type changes.  Global atrophy without hydrocephalus.  No intracranial mass lesion detected on this unenhanced exam.  Left frontal sinus, ethmoid sinus air cells, maxillary sinuses and the right sphenoid sinus partial opacification with polypoid appearance in the sphenoid sinus and maxillary sinus region.  IMPRESSION: No intracranial hemorrhage.  Prominent small vessel disease type changes without CT evidence of large acute infarct.  Difficult to exclude small acute infarct in this setting.  One area which appears new from the prior examination is a small right posterior frontal lobe infarct of indeterminate age (series 2 image 20).  Paranasal sinus opacification as noted above.  This is a code stroke with a call into Dr. Thad Ranger 01/13/2013 7:20 p.m.   Original Report Authenticated By: Lacy Duverney, M.D.     EKG: Independently reviewed.  Assessment/Plan Principal Problem:  *Cerebral embolism with cerebral infarction Active Problems:  HYPERTENSION, BENIGN  Atrial fibrillation   1. Stroke - apparently embolic given the complaints involving multiple vascular territories as noted by Dr. Thad Ranger.  CT scan  negative at this point, MRI cant be performed due to pacer.  Proceeding with the remainder of the stroke work up.  Will defer to neurology wether CTA is indicated or not given that MRA cannot be obtained (although again high suspicion of embolic etiology given risk factors and multiple vascular area involvement).  Thankfully patients symptoms appear to have all but resolved at this point. 2. A.Fib - continue coumadin, INR is currently theraputic 3. HTN - holding BP meds to allow for permissive HTN in setting of acute stroke. 4. AAA - 4.3 cm as of 6 months ago 5. H/o L CEA - patent vessel as of 6 months ago, repeat ultrasound pending for stroke workup.  Dr. Thad Ranger of neurology has seen the patient.  Code Status: Full Code (must indicate code status--if unknown or must be presumed, indicate so) Family Communication: Spoke with several family members at bedside (indicate person spoken with, if applicable, with phone number if by telephone) Disposition Plan: Admit to inpatient (indicate anticipated LOS)  Time spent: 70 min  Lynzi Meulemans M. [facility] Pager [phone]  If 7PM-7AM, please contact night-coverage [email or web address] Password [facility] 01/13/2013, 11:24 PM

## 2013-01-13 NOTE — ED Notes (Signed)
Pt st's he was watching television when he developed double vision.  St's occurred about 4:30pm.  Pt st's double vision comes and goes.  Also c/o weakness in left arm.  Left arm drift present.  Pt also has weakness in left leg. Pt alert and oriented x's 3 skin warm and dry color appropriate.

## 2013-01-14 ENCOUNTER — Encounter: Payer: Self-pay | Admitting: Cardiology

## 2013-01-14 ENCOUNTER — Inpatient Hospital Stay (HOSPITAL_COMMUNITY): Payer: Medicare Other

## 2013-01-14 ENCOUNTER — Encounter (HOSPITAL_COMMUNITY): Payer: Self-pay | Admitting: *Deleted

## 2013-01-14 DIAGNOSIS — J4489 Other specified chronic obstructive pulmonary disease: Secondary | ICD-10-CM

## 2013-01-14 DIAGNOSIS — I714 Abdominal aortic aneurysm, without rupture, unspecified: Secondary | ICD-10-CM

## 2013-01-14 DIAGNOSIS — J449 Chronic obstructive pulmonary disease, unspecified: Secondary | ICD-10-CM

## 2013-01-14 DIAGNOSIS — I059 Rheumatic mitral valve disease, unspecified: Secondary | ICD-10-CM

## 2013-01-14 DIAGNOSIS — R42 Dizziness and giddiness: Secondary | ICD-10-CM

## 2013-01-14 LAB — LIPID PANEL
Cholesterol: 131 mg/dL (ref 0–200)
Triglycerides: 143 mg/dL (ref <150)

## 2013-01-14 LAB — URINALYSIS, ROUTINE W REFLEX MICROSCOPIC
Bilirubin Urine: NEGATIVE
Glucose, UA: NEGATIVE mg/dL
Hgb urine dipstick: NEGATIVE
Ketones, ur: NEGATIVE mg/dL
Leukocytes, UA: NEGATIVE
Nitrite: NEGATIVE
Protein, ur: NEGATIVE mg/dL
Specific Gravity, Urine: 1.016 (ref 1.005–1.030)
Urobilinogen, UA: 0.2 mg/dL (ref 0.0–1.0)
pH: 6.5 (ref 5.0–8.0)

## 2013-01-14 LAB — RAPID URINE DRUG SCREEN, HOSP PERFORMED
Amphetamines: NOT DETECTED
Barbiturates: NOT DETECTED
Benzodiazepines: NOT DETECTED
Cocaine: NOT DETECTED
Opiates: NOT DETECTED
Tetrahydrocannabinol: NOT DETECTED

## 2013-01-14 LAB — HEMOGLOBIN A1C: Hgb A1c MFr Bld: 5.6 % (ref <5.7)

## 2013-01-14 LAB — GLUCOSE, CAPILLARY
Glucose-Capillary: 91 mg/dL (ref 70–99)
Glucose-Capillary: 128 mg/dL — ABNORMAL HIGH (ref 70–99)
Glucose-Capillary: 104 mg/dL — ABNORMAL HIGH (ref 70–99)

## 2013-01-14 LAB — PROTIME-INR
INR: 2.28 — ABNORMAL HIGH (ref 0.00–1.49)
Prothrombin Time: 24.1 seconds — ABNORMAL HIGH (ref 11.6–15.2)

## 2013-01-14 MED ORDER — IOHEXOL 350 MG/ML SOLN
80.0000 mL | Freq: Once | INTRAVENOUS | Status: AC | PRN
  Administered 2013-01-14: 80 mL via INTRAVENOUS

## 2013-01-14 MED ORDER — DABIGATRAN ETEXILATE MESYLATE 150 MG PO CAPS: 150.0000 mg | ORAL_CAPSULE | Freq: Two times a day (BID) | ORAL | Status: DC

## 2013-01-14 NOTE — Evaluation (Signed)
Physical Therapy Evaluation Patient Details Name: [name] [name] MRN: [id number] DOB: [birth year] Today's Date: 01/14/2013 Time: 1308-6578 PT Time Calculation (min): 35 min  PT Assessment / Plan / Recommendation Clinical Impression  Pt is a 72 yo male presenting to ED with L sided wkns and tingling. Pt with noted L UE/LE weakness and impaired balance now requiring use of RW for safe ambulation. Pt with good home set up and support however would greatly benefit from outpt PT to address L sided wkns and balance impairments to return to safe independent function as PTA. Pt motivated and spouse able to take pt to outpt.    PT Assessment  Patient needs continued PT services    Follow Up Recommendations  Outpatient PT;Supervision/Assistance - 24 hour    Does the patient have the potential to tolerate intense rehabilitation      Barriers to Discharge None      Equipment Recommendations  None recommended by PT    Recommendations for Other Services     Frequency Min 4X/week    Precautions / Restrictions Precautions Precautions: Fall Restrictions Weight Bearing Restrictions: No   Pertinent Vitals/Pain 0/10, c/o tingling in L UE/LE      Mobility  Bed Mobility Bed Mobility: Supine to Sit;Sit to Supine Supine to Sit: 4: Min guard;HOB flat Sit to Supine: 5: Supervision;HOB flat Details for Bed Mobility Assistance: increased time/labored effort to transfer to EOB Transfers Transfers: Sit to Stand;Stand to Sit Sit to Stand: 4: Min guard;With upper extremity assist;From bed Stand to Sit: 4: Min guard;With upper extremity assist;To bed (v/c's for safe hand placement) Ambulation/Gait Ambulation/Gait Assistance: 4: Min assist Ambulation Distance (Feet):  (60 x2) Assistive device: Rolling walker;None Ambulation/Gait Assistance Details: initiallty amb without AD - pt very unsteady with L knee instability/buckling but able to maintain balance with minA. amb with RW - pt with increased  stability - v/c's for safe RW management (ie stay in walker). Pt reports feeling more stable with RW. Pt with minimal L knee buckling with RW Gait Pattern: Step-through pattern;Decreased stance time - left;Decreased stride length Gait velocity: wfl Stairs: Yes Stairs Assistance: 4: Min guard Stair Management Technique: Two rails;Step to pattern (v/c's to go "up with the good (right), down with the bad)) Number of Stairs: 4  (instructed spouse on technique as well) Wheelchair Mobility Wheelchair Mobility: No Modified Rankin (Stroke Patients Only) Pre-Morbid Rankin Score: No symptoms Modified Rankin: Slight disability    Exercises     PT Diagnosis: Difficulty walking  PT Problem List: Decreased strength;Decreased balance;Decreased mobility PT Treatment Interventions: Gait training;Functional mobility training;Therapeutic activities;Therapeutic exercise;Balance training;Neuromuscular re-education   PT Goals Acute Rehab PT Goals PT Goal Formulation: With patient/family Time For Goal Achievement: 01/21/13 Potential to Achieve Goals: Good Pt will go Supine/Side to Sit: with modified independence;with HOB 0 degrees PT Goal: Supine/Side to Sit - Progress: Goal set today Pt will go Sit to Stand: with modified independence;with upper extremity assist (up to RW) PT Goal: Sit to Stand - Progress: Goal set today Pt will Ambulate: 51 - 150 feet;with modified independence;with rolling walker PT Goal: Ambulate - Progress: Goal set today Pt will Go Up / Down Stairs: 3-5 stairs;with supervision;with rail(s) PT Goal: Up/Down Stairs - Progress: Goal set today Additional Goals Additional Goal #1: Pt to score > 19/24 on DGI to indicate decreased fall risk PT Goal: Additional Goal #1 - Progress: Goal set today  Visit Information  Last PT Received On: 01/14/13 Assistance Needed: +1    Subjective Data  Subjective: Pt received supine in bed in good spirits agreeable to PT.   Prior Functioning  Home  Living Lives With: Spouse Available Help at Discharge: Family;Available 24 hours/day Type of Home: House Home Access: Stairs to enter [employer] of Steps: 3 or 5 Entrance Stairs-Rails: Can reach both Home Layout: One level Bathroom Shower/Tub: Engineer, manufacturing systems: Standard Bathroom Accessibility: Yes How Accessible: Accessible via walker Home Adaptive Equipment: Walker - rolling Prior Function Level of Independence: Independent Able to Take Stairs?: Yes Driving: Yes Vocation: Retired Musician: No difficulties Dominant Hand: Right    Cognition  Cognition Overall Cognitive Status: Appears within functional limits for tasks assessed/performed Arousal/Alertness: Awake/alert Orientation Level: Oriented X4 / Intact Behavior During Session: WFL for tasks performed    Extremity/Trunk Assessment Right Upper Extremity Assessment RUE ROM/Strength/Tone: Within functional levels RUE Sensation: WFL - Light Touch RUE Coordination: WFL - gross/fine motor Left Upper Extremity Assessment LUE ROM/Strength/Tone: Deficits LUE ROM/Strength/Tone Deficits: L UE drift when attempting holding UE at shld height, grossly 3+/5 t/o LUE Sensation: Deficits LUE Sensation Deficits: "tingling" t/o LUE Coordination: Deficits LUE Coordination Deficits: impaired finger to nose test, unable to consistantly touch tip of nose Right Lower Extremity Assessment RLE ROM/Strength/Tone: Within functional levels RLE Sensation: WFL - Light Touch RLE Coordination: WFL - gross/fine motor Left Lower Extremity Assessment LLE ROM/Strength/Tone: Deficits LLE ROM/Strength/Tone Deficits: grossly 4-/5 t/o LLE Sensation: Deficits LLE Sensation Deficits: "tingling" LLE Coordination: Deficits LLE Coordination Deficits: increased time for heel to chin test Trunk Assessment Trunk Assessment: Normal   Balance Balance Balance Assessed: Yes Dynamic Standing Balance Dynamic Standing  - Balance Support: No upper extremity supported Dynamic Standing - Level of Assistance: 4: Min assist Dynamic Standing - Balance Activities:  (amb) Dynamic Standing - Comments: pt with L knee instability, increased fall risk  End of Session PT - End of Session Equipment Utilized During Treatment: Gait belt Activity Tolerance: Patient tolerated treatment well Patient left: in bed;with family/visitor present Nurse Communication: Mobility status  GP Functional Assessment Tool Used: clinical judgement Functional Limitation: Mobility: Walking and moving around Mobility: Walking and Moving Around Current Status ([id number]): At least 1 percent but less than 20 percent impaired, limited or restricted Mobility: Walking and Moving Around Goal Status [phone]): At least 1 percent but less than 20 percent impaired, limited or restricted Mobility: Walking and Moving Around Discharge Status [phone]): At least 1 percent but less than 20 percent impaired, limited or restricted   Marcene Brawn 01/14/2013, 8:57 AM  Lewis Shock, PT, DPT Pager #: [phone] Office #: [phone]

## 2013-01-14 NOTE — Progress Notes (Signed)
Stroke Team Progress Note  HISTORY [name] [name] is an 72 y.o. male who was fine when he sat down with his wife today 01/13/2013. A few minutes after sitting down he began to experience dizziness and double vision. These symptoms have been intermittent. At some point in time he began to experience left sided numbness and weakness although he does not know when this started. Patient was brought in for evaluation at that time and code stroke was called. Patient was not a TPA candidate secondary to therapeutic INR on coumadin. He was admitted for further evaluation and treatment.  SUBJECTIVE His wife and daughter are at the bedside.  Overall he feels his condition is gradually improving.   OBJECTIVE Most recent Vital Signs: Filed Vitals:   01/14/13 0209 01/14/13 0410 01/14/13 0630 01/14/13 0651  BP: 141/68 138/83 151/46 142/65  Pulse: 66 64 60   Temp: 97.3 F (36.3 C) 97.7 F (36.5 C) 98 F (36.7 C)   TempSrc: Oral Oral Oral   Resp: 22 22 20    Height:      Weight:      SpO2: 94% 96% 95%    CBG (last 3)   Basename 01/13/13 1919  GLUCAP 105*    IV Fluid Intake:     MEDICATIONS    . amiodarone  200 mg Oral Daily  . furosemide  40 mg Oral Daily  . multivitamin with minerals  1 tablet Oral Daily  . niacin  500 mg Oral BID  . potassium chloride SA  40 mEq Oral TID  . simvastatin  40 mg Oral q1800  . warfarin  5 mg Oral Q T,Th,S,Su-1800  . warfarin  7.5 mg Oral Q M,W,F-1800  . Warfarin - Pharmacist Dosing Inpatient   Does not apply q1800   PRN:    Diet:  Cardiac thin liquids Activity:    Up with assistance DVT Prophylaxis:  warfarin  CLINICALLY SIGNIFICANT STUDIES Basic Metabolic Panel:  Lab 01/13/13 1610 01/13/13 1921  NA 148* 145  K 3.6 3.9  CL 110 111  CO2 -- 25  GLUCOSE 110* 112*  BUN 15 15  CREATININE 1.40* 1.21  CALCIUM -- 8.8  MG -- --  PHOS -- --   Liver Function Tests:  Lab 01/13/13 1921  AST 24  ALT 17  ALKPHOS 94  BILITOT 0.4  PROT 6.5  ALBUMIN  3.6   CBC:  Lab 01/13/13 1937 01/13/13 1921  WBC -- 6.8  NEUTROABS -- 3.3  HGB 15.3 15.0  HCT 45.0 45.7  MCV -- 93.6  PLT -- 139*   Coagulation:  Lab 01/14/13 0548 01/13/13 1921  LABPROT 24.1* 22.7*  INR 2.28* 2.10*   Cardiac Enzymes:  Lab 01/13/13 1921  CKTOTAL --  CKMB --  CKMBINDEX --  TROPONINI <0.30   Urinalysis:  Lab 01/14/13 0417  COLORURINE YELLOW  LABSPEC 1.016  PHURINE 6.5  GLUCOSEU NEGATIVE  HGBUR NEGATIVE  BILIRUBINUR NEGATIVE  KETONESUR NEGATIVE  PROTEINUR NEGATIVE  UROBILINOGEN 0.2  NITRITE NEGATIVE  LEUKOCYTESUR NEGATIVE   Lipid Panel    Component Value Date/Time   CHOL 131 01/14/2013 0548   TRIG 143 01/14/2013 0548   HDL 42 01/14/2013 0548   CHOLHDL 3.1 01/14/2013 0548   VLDL 29 01/14/2013 0548   LDLCALC 60 01/14/2013 0548   HgbA1C  Lab Results  Component Value Date   HGBA1C 5.9* 07/09/2011    Urine Drug Screen:     Component Value Date/Time   LABOPIA NONE DETECTED 01/14/2013 0417  COCAINSCRNUR NONE DETECTED 01/14/2013 0417   LABBENZ NONE DETECTED 01/14/2013 0417   AMPHETMU NONE DETECTED 01/14/2013 0417   THCU NONE DETECTED 01/14/2013 0417   LABBARB NONE DETECTED 01/14/2013 0417    Alcohol Level:  Lab 01/13/13 1921  ETH <11   CT of the brain  01/13/2013 No intracranial hemorrhage.  Prominent small vessel disease type changes without CT evidence of large acute infarct.  Difficult to exclude small acute infarct in this setting.  One area which appears new from the prior examination is a small right posterior frontal lobe infarct of indeterminate age (series 2 image 20).  Paranasal sinus opacification.  MRI of the brain    MRA of the brain    2D Echocardiogram    Carotid Doppler  No evidence of hemodynamically significant internal carotid artery stenosis. Vertebral artery flow is antegrade.   CXR    EKG  atrial fibrillation, rate 126.   Therapy Recommendations OP PT  Physical Exam   Pleasant elderly male not in distress..Awake alert. Afebrile.  Head is nontraumatic. Neck is supple without bruit. Hearing is normal. Cardiac exam no murmur or gallop. Lungs are clear to auscultation. Distal pulses are well felt.  Neurological Exam ; Awake  Alert oriented x 3. Normal speech and language.eye movements full without nystagmus.fundi were not visualized. Vision acuity and fields appear normal. Face symmetric. Tongue midline. Normal strength, tone, reflexes and coordination. Normal sensation. Gait deferred.  ASSESSMENT [name] [name] is a 72 y.o. male presenting with dizziness and double vision. 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 Etiology unclear at this point, though embolic is likely. On warfarin prior to admission, INR 2.1 on arrival. Now on warfarin for secondary stroke prevention. 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 Work up underway.  atrial fibrillation, on coumadin, managed by Dr. 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 Should confirm with Dr. [name] prior to changing.  Check CT angio head and neck to confirm stroke, look at vasculature  Annie Main, MSN, RN, ANVP-BC, ANP-BC, GNP-BC [facility] Pager: [phone] 01/14/2013 8:46 AM  I have personally obtained a history, examined the patient, evaluated imaging results, and formulated the assessment and plan of care. I agree with the above.    Delia Heady, MD Medical Director [facility] Pager: [phone] 01/14/2013 5:47 PM

## 2013-01-14 NOTE — Progress Notes (Signed)
[facility]             Progress Note   Subjective: No current complaints. Jovial. Wife and daughter at bedside and updated on plan of care.  Objective: Vital signs in last 24 hours: Temp:  [97.3 F (36.3 C)-98.3 F (36.8 C)] 97.9 F (36.6 C) (02/04 1035) Pulse Rate:  [60-68] 68  (02/04 1035) Resp:  [18-22] 18  (02/04 1035) BP: (123-168)/(46-88) 142/88 mmHg (02/04 1035) SpO2:  [92 %-97 %] 92 % (02/04 1035) FiO2 (%):  [21 %] 21 % (02/03 1902) Weight:  [115.214 kg (254 lb)-116.6 kg (257 lb 0.9 oz)] 115.214 kg (254 lb) (02/04 0004) Weight change:     Intake/Output from previous day: 02/03 0701 - 02/04 0700 In: 480 [P.O.:480] Out: 620 [Urine:620]     Physical Exam: General: Alert, awake, oriented x3, in no acute distress. HEENT: No bruits, no goiter. Heart: Regular rate and rhythm, without murmurs, rubs, gallops. Lungs: Clear to auscultation bilaterally. Abdomen: Soft, nontender, nondistended, positive bowel sounds. Extremities: No clubbing cyanosis or edema with positive pedal pulses.    Lab Results: Basic Metabolic Panel:  Basename 01/13/13 1937 01/13/13 1921  NA 148* 145  K 3.6 3.9  CL 110 111  CO2 -- 25  GLUCOSE 110* 112*  BUN 15 15  CREATININE 1.40* 1.21  CALCIUM -- 8.8  MG -- --  PHOS -- --   Liver Function Tests:  [facility] 01/13/13 1921  AST 24  ALT 17  ALKPHOS 94  BILITOT 0.4  PROT 6.5  ALBUMIN 3.6   CBC:  Basename 01/13/13 1937 01/13/13 1921  WBC -- 6.8  NEUTROABS -- 3.3  HGB 15.3 15.0  HCT 45.0 45.7  MCV -- 93.6  PLT -- 139*   Cardiac Enzymes:  Basename 01/13/13 1921  CKTOTAL --  CKMB --  CKMBINDEX --  TROPONINI <0.30   CBG:  Basename 01/14/13 0639 01/13/13 1919  GLUCAP 91 105*   Fasting Lipid Panel:  Basename 01/14/13 0548  CHOL 131  HDL 42  LDLCALC 60  TRIG 143  CHOLHDL 3.1  LDLDIRECT --   Coagulation:  Basename 01/14/13 0548 01/13/13 1921  LABPROT 24.1* 22.7*  INR 2.28* 2.10*   Urine Drug  Screen: Drugs of Abuse     Component Value Date/Time   LABOPIA NONE DETECTED 01/14/2013 0417   COCAINSCRNUR NONE DETECTED 01/14/2013 0417   LABBENZ NONE DETECTED 01/14/2013 0417   AMPHETMU NONE DETECTED 01/14/2013 0417   THCU NONE DETECTED 01/14/2013 0417   LABBARB NONE DETECTED 01/14/2013 0417    Alcohol Level:  Basename 01/13/13 1921  ETH <11   Urinalysis:  Basename 01/14/13 0417  COLORURINE YELLOW  LABSPEC 1.016  PHURINE 6.5  GLUCOSEU NEGATIVE  HGBUR NEGATIVE  BILIRUBINUR NEGATIVE  KETONESUR NEGATIVE  PROTEINUR NEGATIVE  UROBILINOGEN 0.2  NITRITE NEGATIVE  LEUKOCYTESUR NEGATIVE    Studies/Results: Ct Head Wo Contrast  01/13/2013  *RADIOLOGY REPORT*  Clinical Data: Double vision, left arm droop and left arm weakness. Atrial fibrillation, high blood pressure and hyperlipidemia.  CT HEAD WITHOUT CONTRAST  Technique:  Contiguous axial images were obtained from the base of the skull through the vertex without contrast.  Comparison: 07/09/2011.  Findings: No intracranial hemorrhage.  Prominent small vessel disease type changes without CT evidence of large acute infarct.  Difficult to exclude small acute infarct in this setting.  One area which appears new from the prior examination is a small right posterior frontal lobe infarct of indeterminate age (series 2 image 20).  The right middle cerebral artery branch vessels appear slightly hyperdense on the prior exam (series 2 image 15).  Intracranial atherosclerotic type changes.  Global atrophy without hydrocephalus.  No intracranial mass lesion detected on this unenhanced exam.  Left frontal sinus, ethmoid sinus air cells, maxillary sinuses and the right sphenoid sinus partial opacification with polypoid appearance in the sphenoid sinus and maxillary sinus region.  IMPRESSION: No intracranial hemorrhage.  Prominent small vessel disease type changes without CT evidence of large acute infarct.  Difficult to exclude small acute infarct in this setting.   One area which appears new from the prior examination is a small right posterior frontal lobe infarct of indeterminate age (series 2 image 20).  Paranasal sinus opacification as noted above.  This is a code stroke with a call into Dr. Thad Ranger 01/13/2013 7:20 p.m.   Original Report Authenticated By: Lacy Duverney, M.D.     Medications: Scheduled Meds:   . amiodarone  200 mg Oral Daily  . furosemide  40 mg Oral Daily  . multivitamin with minerals  1 tablet Oral Daily  . niacin  500 mg Oral BID  . potassium chloride SA  40 mEq Oral TID  . simvastatin  40 mg Oral q1800  . warfarin  5 mg Oral Q T,Th,S,Su-1800  . warfarin  7.5 mg Oral Q M,W,F-1800  . Warfarin - Pharmacist Dosing Inpatient   Does not apply q1800   Continuous Infusions:  PRN Meds:.  Assessment/Plan:  Principal Problem:  *Cerebral embolism with cerebral infarction Active Problems:  HYPERTENSION, BENIGN  Atrial fibrillation    Dizziness and Double Vision -Suspect posterior circulation infarct. -Unable to do MRI 2/2 pacemaker. -CTA today. -Neurology is recommending changing anticoagulation to pradaxa as he has failed coumadin for secondary stroke prevention. -Have discussed with Dr. Myrtis Ser, who will confer with Dr. [name]. -Appreciate neurology recommendations. -Complete CVA workup. -Possible DC home in am.  Atrial Fibrillation -Rate controlled. -Therapeutic on coumadin.   Time spent coordinating care: 35 minutes.    LOS: 1 day   [facility] Pager: [phone] 01/14/2013, 12:34 PM

## 2013-01-14 NOTE — Progress Notes (Signed)
SLP Cancellation Note  Patient Details Name: [name] [name] MRN: [id number] DOB: 06-25-1941   Cancelled treatment:        Patient out of room for testing.   Ferdinand Lango MA, CCC-SLP [phone]    [name] [name] 01/14/2013, 3:08 PM

## 2013-01-14 NOTE — Progress Notes (Signed)
VASCULAR LAB PRELIMINARY  PRELIMINARY  PRELIMINARY  PRELIMINARY  Carotid duplex completed.    Preliminary report:  Bilateral:  No evidence of hemodynamically significant internal carotid artery stenosis.   Vertebral artery flow is antegrade.     Houston Surges, RVS 01/14/2013, 9:57 AM

## 2013-01-14 NOTE — Evaluation (Signed)
Occupational Therapy Evaluation Patient Details Name: [name] [name] MRN: [id number] DOB: 1941-05-01 Today's Date: 01/14/2013 Time: 6644-0347 OT Time Calculation (min): 29 min  OT Assessment / Plan / Recommendation Clinical Impression  Pt admitted with L side weakness and tingling, dizziness and diplopia.  Pt will benefit from OT to address ADL, balance related to ADL, L UE weakness, and visual deficits detailed below.  Family will provide 24 hour care of pt.  Recommend OPOT.    OT Assessment  Patient needs continued OT Services    Follow Up Recommendations  Outpatient OT;Supervision/Assistance - 24 hour    Barriers to Discharge      Equipment Recommendations       Recommendations for Other Services    Frequency  Min 3X/week    Precautions / Restrictions Precautions Precautions: Fall Restrictions Weight Bearing Restrictions: No   Pertinent Vitals/Pain No pain.    ADL  Eating/Feeding: Set up Where Assessed - Eating/Feeding: Chair Grooming: Wash/dry hands;Supervision/safety Where Assessed - Grooming: Unsupported standing Upper Body Bathing: Minimal assistance Where Assessed - Upper Body Bathing: Unsupported sitting Lower Body Bathing: Min guard Where Assessed - Lower Body Bathing: Unsupported sitting;Supported standing Upper Body Dressing: Set up Where Assessed - Upper Body Dressing: Unsupported sitting Lower Body Dressing: Min guard Where Assessed - Lower Body Dressing: Unsupported sitting;Supported sit to stand Toilet Transfer: Min Pension scheme manager Method: Sit to Barista: Comfort height toilet Toileting - Architect and Hygiene: Min guard Where Assessed - Engineer, mining and Hygiene: Standing Equipment Used: Gait belt Transfers/Ambulation Related to ADLs: min guard assist without a device within room/bathroom ADL Comments: Pt spontaneously and effectively used L UE in bilat ADL tasks of opening soda can,  washing hands, donning socks.    OT Diagnosis: Generalized weakness;Hemiplegia non-dominant side  OT Problem List: Decreased strength;Impaired balance (sitting and/or standing);Impaired vision/perception;Obesity;Impaired sensation;Impaired UE functional use OT Treatment Interventions: Self-care/ADL training;Therapeutic activities;Visual/perceptual remediation/compensation;Patient/family education   OT Goals Acute Rehab OT Goals OT Goal Formulation: With patient Time For Goal Achievement: 01/21/13 Potential to Achieve Goals: Good ADL Goals Pt Will Perform Tub/Shower Transfer: Tub transfer;with supervision;Ambulation ADL Goal: Tub/Shower Transfer - Progress: Goal set today Miscellaneous OT Goals Miscellaneous OT Goal #1: Pt will locate ADL items in the L visual field with supervision. OT Goal: Miscellaneous Goal #1 - Progress: Goal set today Miscellaneous OT Goal #2: Pt will verbalize awareness of safety issues related to vision deficits, ie: falls, stove use. OT Goal: Miscellaneous Goal #2 - Progress: Goal set today Miscellaneous OT Goal #3: Pt will gather necessary ADL items around room with supervision. OT Goal: Miscellaneous Goal #3 - Progress: Goal set today Miscellaneous OT Goal #4: Pt will be independent in L UE AROM home exercise program. OT Goal: Miscellaneous Goal #4 - Progress: Goal set today  Visit Information  Last OT Received On: 01/14/13 Assistance Needed: +1    Subjective Data  Subjective: "Are you sure there is a brain in there?" Patient Stated Goal: Return to PLOF.   Prior Functioning     Home Living Lives With: Spouse Available Help at Discharge: Family;Available 24 hours/day Type of Home: House Home Access: Stairs to enter [employer] of Steps: 3 or 5 Entrance Stairs-Rails: Can reach both Home Layout: One level Bathroom Shower/Tub: Engineer, manufacturing systems: Standard Bathroom Accessibility: Yes How Accessible: Accessible via  walker Home Adaptive Equipment: Walker - rolling Prior Function Level of Independence: Independent Able to Take Stairs?: Yes Driving: Yes Vocation: Retired Musician: No  difficulties Dominant Hand: Right         Vision/Perception Vision - History Baseline Vision: Wears glasses all the time Patient Visual Report: Unable to keep objects in focus;Peripheral vision impairment Vision - Assessment Eye Alignment: Within Functional Limits Vision Assessment: Vision tested Tracking/Visual Pursuits: Unable to hold eye position out of midline;Decreased smoothness of eye movement to LEFT superior field;Decreased smoothness of eye movement to LEFT inferior field;Decreased smoothness of horizontal tracking;Decreased smoothness of vertical tracking Saccades: Additional eye shifts occurred during testing Convergence: Impaired (comment) Visual Fields: Left visual field deficit   Cognition  Cognition Overall Cognitive Status: Appears within functional limits for tasks assessed/performed Arousal/Alertness: Awake/alert Orientation Level: Oriented X4 / Intact Behavior During Session: WFL for tasks performed Cognition - Other Comments: pt tends to joke rather than acknowledge ramifications of his deficits    Extremity/Trunk Assessment Right Upper Extremity Assessment RUE ROM/Strength/Tone: Within functional levels RUE Sensation: WFL - Light Touch;WFL - Proprioception RUE Coordination: WFL - gross/fine motor Left Upper Extremity Assessment LUE ROM/Strength/Tone: Deficits LUE ROM/Strength/Tone Deficits: 3/5 shoulder, 3+/5 elbow to hand LUE Sensation: Deficits LUE Sensation Deficits: impaired temp, finger id, positive drift with vision occluded LUE Coordination: Deficits LUE Coordination Deficits: incoordination with finger/nose, forearm rotation, alternating thumb/finger Trunk Assessment Trunk Assessment: Normal     Mobility Bed Mobility Bed Mobility: Not  assessed Transfers Sit to Stand: 4: Min guard;With upper extremity assist;From chair/3-in-1;From toilet Stand to Sit: 4: Min guard;With upper extremity assist;To toilet (to w/c)     Exercise     Balance     End of Session OT - End of Session Equipment Utilized During Treatment: Gait belt Activity Tolerance: Patient tolerated treatment well Patient left: Other (comment) (w/c for test)  GO     Evern Bio 01/14/2013, 12:32 PM [phone]

## 2013-01-14 NOTE — Progress Notes (Signed)
   I was contacted by the neurology team today. I was told that the patient has had a neurologic event while on full dose Coumadin. The event was felt to be embolic. Neurology recommendation was to change the patient to one of the new anticoagulants, If this was okay with cardiology. I spoke with Dr. [name] about this and we both agree that this would be okay. I passed the information back to neurology that any of the 3 new anticoagulants would be okay since his renal function is stable.  Jerral Bonito, MD

## 2013-01-14 NOTE — Progress Notes (Signed)
  Echocardiogram 2D Echocardiogram has been performed.  [name] [name] 01/14/2013, 10:00 AM

## 2013-01-15 LAB — GLUCOSE, CAPILLARY: Glucose-Capillary: 112 mg/dL — ABNORMAL HIGH (ref 70–99)

## 2013-01-15 LAB — PROTIME-INR: INR: 2 — ABNORMAL HIGH (ref 0.00–1.49)

## 2013-01-15 MED ORDER — DABIGATRAN ETEXILATE MESYLATE 150 MG PO CAPS
150.0000 mg | ORAL_CAPSULE | Freq: Two times a day (BID) | ORAL | Status: DC
  Administered 2013-01-15: 150 mg via ORAL
  Filled 2013-01-15 (×2): qty 1

## 2013-01-15 MED ORDER — ATORVASTATIN CALCIUM 20 MG PO TABS
20.0000 mg | ORAL_TABLET | Freq: Every day | ORAL | Status: DC
  Filled 2013-01-15: qty 1

## 2013-01-15 MED ORDER — DABIGATRAN ETEXILATE MESYLATE 150 MG PO CAPS: 150.0000 mg | ORAL_CAPSULE | Freq: Two times a day (BID) | ORAL | Status: DC

## 2013-01-15 NOTE — Progress Notes (Signed)
Stroke Team Progress Note  HISTORY [name] [name] is an 72 y.o. male who was fine when he sat down with his wife today 01/13/2013. A few minutes after sitting down he began to experience dizziness and double vision. These symptoms have been intermittent. At some point in time he began to experience left sided numbness and weakness although he does not know when this started. Patient was brought in for evaluation at that time and code stroke was called. Patient was not a TPA candidate secondary to therapeutic INR on coumadin. He was admitted for further evaluation and treatment.  SUBJECTIVE Wife at bedside.  OBJECTIVE Most recent Vital Signs: Filed Vitals:   01/14/13 2145 01/15/13 0200 01/15/13 0600 01/15/13 0730  BP: 143/73 146/78 132/71 136/73  Pulse: 71 66 71 65  Temp: 97.9 F (36.6 C) 97.6 F (36.4 C) 97.8 F (36.6 C) 98 F (36.7 C)  TempSrc:      Resp: 18 18 18 20   Height:      Weight:      SpO2: 95% 94% 97% 99%   CBG (last 3)   Basename 01/15/13 0638 01/14/13 2100 01/14/13 1701  GLUCAP 130* 104* 128*   IV Fluid Intake:     MEDICATIONS    . amiodarone  200 mg Oral Daily  . furosemide  40 mg Oral Daily  . multivitamin with minerals  1 tablet Oral Daily  . niacin  500 mg Oral BID  . potassium chloride SA  40 mEq Oral TID  . simvastatin  40 mg Oral q1800   PRN:    Diet:  Cardiac thin liquids Activity:    Up with assistance DVT Prophylaxis:  warfarin  CLINICALLY SIGNIFICANT STUDIES Basic Metabolic Panel:   Lab 01/13/13 1937 01/13/13 1921  NA 148* 145  K 3.6 3.9  CL 110 111  CO2 -- 25  GLUCOSE 110* 112*  BUN 15 15  CREATININE 1.40* 1.21  CALCIUM -- 8.8  MG -- --  PHOS -- --   Liver Function Tests:   Lab 01/13/13 1921  AST 24  ALT 17  ALKPHOS 94  BILITOT 0.4  PROT 6.5  ALBUMIN 3.6   CBC:   Lab 01/13/13 1937 01/13/13 1921  WBC -- 6.8  NEUTROABS -- 3.3  HGB 15.3 15.0  HCT 45.0 45.7  MCV -- 93.6  PLT -- 139*   Coagulation:   Lab 01/15/13  0620 01/14/13 0548 01/13/13 1921  LABPROT 21.9* 24.1* 22.7*  INR 2.00* 2.28* 2.10*   Cardiac Enzymes:   Lab 01/13/13 1921  CKTOTAL --  CKMB --  CKMBINDEX --  TROPONINI <0.30   Urinalysis:   Lab 01/14/13 0417  COLORURINE YELLOW  LABSPEC 1.016  PHURINE 6.5  GLUCOSEU NEGATIVE  HGBUR NEGATIVE  BILIRUBINUR NEGATIVE  KETONESUR NEGATIVE  PROTEINUR NEGATIVE  UROBILINOGEN 0.2  NITRITE NEGATIVE  LEUKOCYTESUR NEGATIVE   Lipid Panel    Component Value Date/Time   CHOL 131 01/14/2013 0548   TRIG 143 01/14/2013 0548   HDL 42 01/14/2013 0548   CHOLHDL 3.1 01/14/2013 0548   VLDL 29 01/14/2013 0548   LDLCALC 60 01/14/2013 0548   HgbA1C  Lab Results  Component Value Date   HGBA1C 5.6 01/14/2013    Urine Drug Screen:     Component Value Date/Time   LABOPIA NONE DETECTED 01/14/2013 0417   COCAINSCRNUR NONE DETECTED 01/14/2013 0417   LABBENZ NONE DETECTED 01/14/2013 0417   AMPHETMU NONE DETECTED 01/14/2013 0417   THCU NONE DETECTED 01/14/2013 0417  LABBARB NONE DETECTED 01/14/2013 0417    Alcohol Level:   Lab 01/13/13 1921  ETH <11   CT of the brain  01/13/2013 No intracranial hemorrhage.  Prominent small vessel disease type changes without CT evidence of large acute infarct.  Difficult to exclude small acute infarct in this setting.  One area which appears new from the prior examination is a small right posterior frontal lobe infarct of indeterminate age (series 2 image 20).  Paranasal sinus opacification.  MRI of the brain    MRA of the brain    CT angio head 01/14/2013  Atherosclerotic disease in the cavernous carotid bilaterally with moderate stenosis.  There is atherosclerotic disease of the distal vertebral arteries which are patent bilaterally.  No large vessel intracranial occlusion or aneurysm.    CT angio neck 01/14/2013   Diffuse atherosclerotic disease.  No significant carotid stenosis. Probable carotid endarterectomy on the left which is widely patent. Both vertebral arteries are patent  with scattered atherosclerotic disease.    2D Echocardiogram  ejection fraction was in the range of 25% to 30%. Diffuse hypokinesis. Regional wall motion abnormalities: There is akinesis of the basal-mid inferoposterior myocardium.  Carotid Doppler  No evidence of hemodynamically significant internal carotid artery stenosis. Vertebral artery flow is antegrade.   CXR    EKG  atrial fibrillation, rate 126.   Therapy Recommendations OP PT & OT  Physical Exam   Pleasant elderly male not in distress..Awake alert. Afebrile. Head is nontraumatic. Neck is supple without bruit. Hearing is normal. Cardiac exam no murmur or gallop. Lungs are clear to auscultation. Distal pulses are well felt.  Neurological Exam ; Awake  Alert oriented x 3. Normal speech and language.eye movements full without nystagmus.fundi were not visualized. Vision acuity and fields appear normal. Face symmetric. Tongue midline. Normal strength, tone, reflexes and coordination. Normal sensation. Gait deferred.   ASSESSMENT [name] is a 72 y.o. male presenting with dizziness and double vision. 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 Etiology unclear at this point, though embolic is likely. On warfarin prior to admission, INR 2.1 on arrival. Now on warfarin for secondary stroke prevention. 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 Work up completed.  atrial fibrillation, on coumadin, managed by Dr. 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 Dr. [name] ok'd change.  OP PT and OT. Prefers in Advance for discharge from neuro standpoint. Stroke Service will sign off. Please call should any needs arise. Follow up with Dr.  Pearlean Brownie, Stroke [facility], in 2 months.  Annie Main, MSN, RN, ANVP-BC, ANP-BC, [facility] Pager: [phone] 01/15/2013 10:06 AM  I have personally obtained a history, examined the patient, evaluated imaging results, and formulated the assessment and plan of care. I agree with the above.  Delia Heady, MD Medical Director [facility] Pager: [phone] 01/15/2013 10:06 AM

## 2013-01-15 NOTE — Discharge Summary (Signed)
Physician Discharge Summary  DESHANE COTRONEO MRN: [id number] DOB/AGE: April 08, 1941 72 y.o.  PCP: [name], MD   Admit date: 01/13/2013 Discharge date: 01/15/2013  Discharge Diagnoses:     *Cerebral embolism with cerebral infarction Active Problems:  HYPERTENSION, BENIGN  Atrial fibrillation     Medication List     As of 01/15/2013 11:58 AM    STOP taking these medications         warfarin 5 MG tablet   Commonly known as: COUMADIN      TAKE these medications         amiodarone 200 MG tablet   Commonly known as: PACERONE   Take 1 tablet (200 mg total) by mouth daily.      amLODipine 10 MG tablet   Commonly known as: NORVASC   Take 0.5 tablets (5 mg total) by mouth daily.      dabigatran 150 MG Caps   Commonly known as: PRADAXA   Take 1 capsule (150 mg total) by mouth every 12 (twelve) hours.      furosemide 40 MG tablet   Commonly known as: LASIX   Take 1 tablet (40 mg total) by mouth daily.      hydrALAZINE 50 MG tablet   Commonly known as: APRESOLINE   Take 25 mg by mouth 2 (two) times daily.      KLOR-CON M20 20 MEQ tablet   Generic drug: potassium chloride SA   Take 40 mEq by mouth 3 (three) times daily.      lisinopril 40 MG tablet   Commonly known as: PRINIVIL,ZESTRIL   Take 1 tablet (40 mg total) by mouth daily.      metoprolol succinate 25 MG 24 hr tablet   Commonly known as: TOPROL-XL   Take 0.5 tablets (12.5 mg total) by mouth daily.      multivitamin with minerals Tabs   Take 1 tablet by mouth daily.      niacin 500 MG CR tablet   Commonly known as: NIASPAN   Take 500 mg by mouth 2 (two) times daily.      nitroGLYCERIN 0.4 MG SL tablet   Commonly known as: NITROSTAT   Place 0.4 mg under the tongue every 5 (five) minutes as needed. For chest pain      pravastatin 80 MG tablet   Commonly known as: PRAVACHOL   Take 1 tablet (80 mg total) by mouth daily.        Discharge Condition: Stable  Disposition: 01-Home or Self  Care   Consults: Stable  Significant Diagnostic Studies: Ct Angio Head W/cm &/or Wo Cm  01/14/2013  *RADIOLOGY REPORT*  Clinical Data:  Stroke.  Dizziness and blurred vision.  Left-sided weakness and numbness  CT ANGIOGRAPHY HEAD AND NECK  Technique:  Multidetector CT imaging of the head and neck was performed using the standard protocol during bolus administration of intravenous contrast.  Multiplanar CT image reconstructions including MIPs were obtained to evaluate the vascular anatomy. Carotid stenosis measurements (when applicable) are obtained utilizing NASCET criteria, using the distal internal carotid diameter as the denominator.  Contrast: 80mL OMNIPAQUE IOHEXOL 350 MG/ML SOLN  An additional dose of 50 ml Omnipaque 350 was given due to lack of adequate arterial opacification.  Comparison:  CT head 01/13/2013  CTA NECK  Findings:  Atherosclerotic aorta.  Standard branching pattern of the aorta with atherosclerotic calcification of the brachiocephalic artery without significant stenosis.  Dual lead pacemaker is present.  No mass or adenopathy is  present in the neck.  Right carotid:  Right common carotid artery is widely patent without significant stenosis.  Atherosclerotic calcification of the carotid bifurcation without significant stenosis.  There is mild atherosclerotic calcification of the cervical carotid without stenosis.  Left carotid:  Atherosclerotic calcification in the left common carotid artery without stenosis.  There appears to have been a carotid endarterectomy on the left with dilatation of the lumen. No significant carotid stenosis on the left.  Atherosclerotic calcification is present of the internal carotid artery at the skull base.  Vertebral arteries:  Both vertebral arteries are patent to the basilar.  There is atherosclerotic calcification of the vertebral origin bilaterally.  Distal vertebral atherosclerotic calcification is present.   Review of the MIP images confirms the above  findings.  IMPRESSION: Diffuse atherosclerotic disease.  No significant carotid stenosis. Probable carotid endarterectomy on the left which is widely patent. Both vertebral arteries are patent with scattered atherosclerotic disease.  CTA HEAD  Findings:  Postcontrast imaging of the brain reveals no enhancing lesion.  Patchy white matter ischemia is present which appears chronic.  No hemorrhage is present.  Both vertebral arteries are patent to the basilar with atherosclerotic calcification proximal to the basilar.  Both PICA are patent.  The basilar is widely patent.  Superior cerebellar and posterior cerebral arteries are patent bilaterally without stenosis.  Extensive calcification in the cavernous carotid bilaterally with moderate narrowing bilaterally.  Supraclinoid internal carotid artery is widely patent bilaterally.  Anterior and middle cerebral arteries are patent bilaterally without stenosis.  Negative for cerebral aneurysm.  Chronic sinusitis   Review of the MIP images confirms the above findings.  IMPRESSION: Atherosclerotic disease in the cavernous carotid bilaterally with moderate stenosis.  There is atherosclerotic disease of the distal vertebral arteries which are patent bilaterally.  No large vessel intracranial occlusion or aneurysm.   Original Report Authenticated By: Janeece Riggers, M.D.    Ct Head Wo Contrast  01/13/2013  *RADIOLOGY REPORT*  Clinical Data: Double vision, left arm droop and left arm weakness. Atrial fibrillation, high blood pressure and hyperlipidemia.  CT HEAD WITHOUT CONTRAST  Technique:  Contiguous axial images were obtained from the base of the skull through the vertex without contrast.  Comparison: 07/09/2011.  Findings: No intracranial hemorrhage.  Prominent small vessel disease type changes without CT evidence of large acute infarct.  Difficult to exclude small acute infarct in this setting.  One area which appears new from the prior examination is a small right posterior  frontal lobe infarct of indeterminate age (series 2 image 20).  The right middle cerebral artery branch vessels appear slightly hyperdense on the prior exam (series 2 image 15).  Intracranial atherosclerotic type changes.  Global atrophy without hydrocephalus.  No intracranial mass lesion detected on this unenhanced exam.  Left frontal sinus, ethmoid sinus air cells, maxillary sinuses and the right sphenoid sinus partial opacification with polypoid appearance in the sphenoid sinus and maxillary sinus region.  IMPRESSION: No intracranial hemorrhage.  Prominent small vessel disease type changes without CT evidence of large acute infarct.  Difficult to exclude small acute infarct in this setting.  One area which appears new from the prior examination is a small right posterior frontal lobe infarct of indeterminate age (series 2 image 20).  Paranasal sinus opacification as noted above.  This is a code stroke with a call into Dr. Thad Ranger 01/13/2013 7:20 p.m.   Original Report Authenticated By: Lacy Duverney, M.D.    Ct Angio Neck W/cm &/or Wo/cm  01/14/2013  *RADIOLOGY REPORT*  Clinical Data:  Stroke.  Dizziness and blurred vision.  Left-sided weakness and numbness  CT ANGIOGRAPHY HEAD AND NECK  Technique:  Multidetector CT imaging of the head and neck was performed using the standard protocol during bolus administration of intravenous contrast.  Multiplanar CT image reconstructions including MIPs were obtained to evaluate the vascular anatomy. Carotid stenosis measurements (when applicable) are obtained utilizing NASCET criteria, using the distal internal carotid diameter as the denominator.  Contrast: 80mL OMNIPAQUE IOHEXOL 350 MG/ML SOLN  An additional dose of 50 ml Omnipaque 350 was given due to lack of adequate arterial opacification.  Comparison:  CT head 01/13/2013  CTA NECK  Findings:  Atherosclerotic aorta.  Standard branching pattern of the aorta with atherosclerotic calcification of the brachiocephalic  artery without significant stenosis.  Dual lead pacemaker is present.  No mass or adenopathy is present in the neck.  Right carotid:  Right common carotid artery is widely patent without significant stenosis.  Atherosclerotic calcification of the carotid bifurcation without significant stenosis.  There is mild atherosclerotic calcification of the cervical carotid without stenosis.  Left carotid:  Atherosclerotic calcification in the left common carotid artery without stenosis.  There appears to have been a carotid endarterectomy on the left with dilatation of the lumen. No significant carotid stenosis on the left.  Atherosclerotic calcification is present of the internal carotid artery at the skull base.  Vertebral arteries:  Both vertebral arteries are patent to the basilar.  There is atherosclerotic calcification of the vertebral origin bilaterally.  Distal vertebral atherosclerotic calcification is present.   Review of the MIP images confirms the above findings.  IMPRESSION: Diffuse atherosclerotic disease.  No significant carotid stenosis. Probable carotid endarterectomy on the left which is widely patent. Both vertebral arteries are patent with scattered atherosclerotic disease.  CTA HEAD  Findings:  Postcontrast imaging of the brain reveals no enhancing lesion.  Patchy white matter ischemia is present which appears chronic.  No hemorrhage is present.  Both vertebral arteries are patent to the basilar with atherosclerotic calcification proximal to the basilar.  Both PICA are patent.  The basilar is widely patent.  Superior cerebellar and posterior cerebral arteries are patent bilaterally without stenosis.  Extensive calcification in the cavernous carotid bilaterally with moderate narrowing bilaterally.  Supraclinoid internal carotid artery is widely patent bilaterally.  Anterior and middle cerebral arteries are patent bilaterally without stenosis.  Negative for cerebral aneurysm.  Chronic sinusitis   Review of  the MIP images confirms the above findings.  IMPRESSION: Atherosclerotic disease in the cavernous carotid bilaterally with moderate stenosis.  There is atherosclerotic disease of the distal vertebral arteries which are patent bilaterally.  No large vessel intracranial occlusion or aneurysm.   Original Report Authenticated By: Janeece Riggers, M.D.     2-D echo LV EF: 25% - 30%  ------------------------------------------------------------ Indications: CVA 436.  ------------------------------------------------------------ History: PMH: Dyspnea. Atrial fibrillation. Ischemic cardiomyopathy. Chronic obstructive pulmonary disease. Risk factors: Former tobacco use. Hypertension. Dyslipidemia.  ------------------------------------------------------------ Study Conclusions  - Left ventricle: The cavity size was mildly dilated. Wall thickness was increased in a pattern of moderate LVH. Systolic function was severely reduced. The estimated ejection fraction was in the range of 25% to 30%. Diffuse hypokinesis. There is akinesis of the basal-mid inferoposterior myocardium. Doppler parameters are consistent with abnormal left ventricular relaxation (grade 1 diastolic dysfunction). - Aortic valve: Trivial regurgitation. - Mitral valve: Calcified annulus. Mild regurgitation. - Left atrium: The atrium was moderately dilated. - Right ventricle: The  cavity size was mildly dilated. - Right atrium: The atrium was mildly dilated.       Microbiology: No results found for this or any previous visit (from the past 240 hour(s)).   Labs: Results for orders placed during the hospital encounter of 01/13/13 (from the past 48 hour(s))  GLUCOSE, CAPILLARY     Status: Abnormal   Collection Time   01/13/13  7:19 PM      Component Value Range Comment   Glucose-Capillary 105 (*) 70 - 99 mg/dL   ETHANOL     Status: Normal   Collection Time   01/13/13  7:21 PM      Component Value Range Comment   Alcohol,  Ethyl (B) <11  0 - 11 mg/dL   PROTIME-INR     Status: Abnormal   Collection Time   01/13/13  7:21 PM      Component Value Range Comment   Prothrombin Time 22.7 (*) 11.6 - 15.2 seconds    INR 2.10 (*) 0.00 - 1.49   APTT     Status: Abnormal   Collection Time   01/13/13  7:21 PM      Component Value Range Comment   aPTT 42 (*) 24 - 37 seconds   CBC     Status: Abnormal   Collection Time   01/13/13  7:21 PM      Component Value Range Comment   WBC 6.8  4.0 - 10.5 K/uL    RBC 4.88  4.22 - 5.81 MIL/uL    Hemoglobin 15.0  13.0 - 17.0 g/dL    HCT 40.9  81.1 - 91.4 %    MCV 93.6  78.0 - 100.0 fL    MCH 30.7  26.0 - 34.0 pg    MCHC 32.8  30.0 - 36.0 g/dL    RDW 78.2  95.6 - 21.3 %    Platelets 139 (*) 150 - 400 K/uL   DIFFERENTIAL     Status: Abnormal   Collection Time   01/13/13  7:21 PM      Component Value Range Comment   Neutrophils Relative 49  43 - 77 %    Neutro Abs 3.3  1.7 - 7.7 K/uL    Lymphocytes Relative 35  12 - 46 %    Lymphs Abs 2.4  0.7 - 4.0 K/uL    Monocytes Relative 8  3 - 12 %    Monocytes Absolute 0.6  0.1 - 1.0 K/uL    Eosinophils Relative 7 (*) 0 - 5 %    Eosinophils Absolute 0.5  0.0 - 0.7 K/uL    Basophils Relative 1  0 - 1 %    Basophils Absolute 0.1  0.0 - 0.1 K/uL   COMPREHENSIVE METABOLIC PANEL     Status: Abnormal   Collection Time   01/13/13  7:21 PM      Component Value Range Comment   Sodium 145  135 - 145 mEq/L    Potassium 3.9  3.5 - 5.1 mEq/L    Chloride 111  96 - 112 mEq/L    CO2 25  19 - 32 mEq/L    Glucose, Bld 112 (*) 70 - 99 mg/dL    BUN 15  6 - 23 mg/dL    Creatinine, Ser 0.86  0.50 - 1.35 mg/dL    Calcium 8.8  8.4 - 57.8 mg/dL    Total Protein 6.5  6.0 - 8.3 g/dL    Albumin 3.6  3.5 - 5.2 g/dL  AST 24  0 - 37 U/L    ALT 17  0 - 53 U/L    Alkaline Phosphatase 94  39 - 117 U/L    Total Bilirubin 0.4  0.3 - 1.2 mg/dL    GFR calc non Af Amer 59 (*) >90 mL/min    GFR calc Af Amer 68 (*) >90 mL/min   TROPONIN I     Status: Normal    Collection Time   01/13/13  7:21 PM      Component Value Range Comment   Troponin I <0.30  <0.30 ng/mL   POCT I-STAT TROPONIN I     Status: Normal   Collection Time   01/13/13  7:35 PM      Component Value Range Comment   Troponin i, poc 0.00  0.00 - 0.08 ng/mL    Comment 3            POCT I-STAT, CHEM 8     Status: Abnormal   Collection Time   01/13/13  7:37 PM      Component Value Range Comment   Sodium 148 (*) 135 - 145 mEq/L    Potassium 3.6  3.5 - 5.1 mEq/L    Chloride 110  96 - 112 mEq/L    BUN 15  6 - 23 mg/dL    Creatinine, Ser 1.61 (*) 0.50 - 1.35 mg/dL    Glucose, Bld 096 (*) 70 - 99 mg/dL    Calcium, Ion 0.45  4.09 - 1.30 mmol/L    TCO2 27  0 - 100 mmol/L    Hemoglobin 15.3  13.0 - 17.0 g/dL    HCT 81.1  91.4 - 78.2 %   URINE RAPID DRUG SCREEN (HOSP PERFORMED)     Status: Normal   Collection Time   01/14/13  4:17 AM      Component Value Range Comment   Opiates NONE DETECTED  NONE DETECTED    Cocaine NONE DETECTED  NONE DETECTED    Benzodiazepines NONE DETECTED  NONE DETECTED    Amphetamines NONE DETECTED  NONE DETECTED    Tetrahydrocannabinol NONE DETECTED  NONE DETECTED    Barbiturates NONE DETECTED  NONE DETECTED   URINALYSIS, ROUTINE W REFLEX MICROSCOPIC     Status: Normal   Collection Time   01/14/13  4:17 AM      Component Value Range Comment   Color, Urine YELLOW  YELLOW    APPearance CLEAR  CLEAR    Specific Gravity, Urine 1.016  1.005 - 1.030    pH 6.5  5.0 - 8.0    Glucose, UA NEGATIVE  NEGATIVE mg/dL    Hgb urine dipstick NEGATIVE  NEGATIVE    Bilirubin Urine NEGATIVE  NEGATIVE    Ketones, ur NEGATIVE  NEGATIVE mg/dL    Protein, ur NEGATIVE  NEGATIVE mg/dL    Urobilinogen, UA 0.2  0.0 - 1.0 mg/dL    Nitrite NEGATIVE  NEGATIVE    Leukocytes, UA NEGATIVE  NEGATIVE MICROSCOPIC NOT DONE ON URINES WITH NEGATIVE PROTEIN, BLOOD, LEUKOCYTES, NITRITE, OR GLUCOSE <1000 mg/dL.  HEMOGLOBIN A1C     Status: Normal   Collection Time   01/14/13  5:48 AM      Component  Value Range Comment   Hemoglobin A1C 5.6  <5.7 %    Mean Plasma Glucose 114  <117 mg/dL   LIPID PANEL     Status: Normal   Collection Time   01/14/13  5:48 AM      Component Value  Range Comment   Cholesterol 131  0 - 200 mg/dL    Triglycerides 161  <096 mg/dL    HDL 42  >04 mg/dL    Total CHOL/HDL Ratio 3.1      VLDL 29  0 - 40 mg/dL    LDL Cholesterol 60  0 - 99 mg/dL   PROTIME-INR     Status: Abnormal   Collection Time   01/14/13  5:48 AM      Component Value Range Comment   Prothrombin Time 24.1 (*) 11.6 - 15.2 seconds    INR 2.28 (*) 0.00 - 1.49   GLUCOSE, CAPILLARY     Status: Normal   Collection Time   01/14/13  6:39 AM      Component Value Range Comment   Glucose-Capillary 91  70 - 99 mg/dL    Comment 1 Documented in Chart      Comment 2 Notify RN     GLUCOSE, CAPILLARY     Status: Abnormal   Collection Time   01/14/13  5:01 PM      Component Value Range Comment   Glucose-Capillary 128 (*) 70 - 99 mg/dL   GLUCOSE, CAPILLARY     Status: Abnormal   Collection Time   01/14/13  9:00 PM      Component Value Range Comment   Glucose-Capillary 104 (*) 70 - 99 mg/dL   PROTIME-INR     Status: Abnormal   Collection Time   01/15/13  6:20 AM      Component Value Range Comment   Prothrombin Time 21.9 (*) 11.6 - 15.2 seconds    INR 2.00 (*) 0.00 - 1.49   GLUCOSE, CAPILLARY     Status: Abnormal   Collection Time   01/15/13  6:38 AM      Component Value Range Comment   Glucose-Capillary 130 (*) 70 - 99 mg/dL      HPI :[name] [name] is an 72 y.o. male who was fine when he sat down with his wife today 01/13/2013. A few minutes after sitting down he began to experience dizziness and double vision. These symptoms have been intermittent. At some point in time he began to experience left sided numbness and weakness although he does not know when this started. Patient was brought in for evaluation at that time and code stroke was called. Patient was not a TPA candidate secondary to therapeutic  INR on coumadin. He was admitted for further evaluation and treatment.      HOSPITAL COURSE:   Dizziness and Double Vision  CT angio head 01/14/2013 Atherosclerotic disease in the cavernous carotid bilaterally with moderate stenosis. There is atherosclerotic disease of the distal vertebral arteries which are patent bilaterally. No large vessel intracranial occlusion or aneurysm.  CT angio neck 01/14/2013 Diffuse atherosclerotic disease. No significant carotid stenosis. Probable carotid endarterectomy on the left which is widely patent. Both vertebral arteries are patent with scattered atherosclerotic disease.  2D Echocardiogram ejection fraction was in the range of 25% to 30%. Diffuse hypokinesis. Regional wall motion abnormalities: There is akinesis of the basal-mid inferoposterior myocardium.  Carotid Doppler No evidence of hemodynamically significant internal carotid artery stenosis. Vertebral artery flow is antegrade.  CXR  EKG atrial fibrillation, rate 126.  Therapy Recommendations OP PT & OT  -Suspect posterior circulation infarct.  -Unable to do MRI 2/2 pacemaker.  -CTA as above -Neurology has recommended changing anticoagulation to pradaxa as he has failed coumadin for secondary stroke prevention. Continue statin -Have discussed with Dr.  Myrtis Ser, okay per Dr. [name].  -Appreciate neurology recommendations.  Outpatient therapy -Possible DC home today  Atrial Fibrillation  -Rate controlled.  -Therapeutic on coumadin   Discharge Exam:   Blood pressure 161/95, pulse 68, temperature 97.9 F (36.6 C), temperature source Oral, resp. rate 20, height 6\' 1"  (1.854 m), weight 115.214 kg (254 lb), SpO2 97.00%.   General: Alert, awake, oriented x3, in no acute distress.  HEENT: No bruits, no goiter.  Heart: Regular rate and rhythm, without murmurs, rubs, gallops.  Lungs: Clear to auscultation bilaterally.  Abdomen: Soft, nontender, nondistended, positive bowel sounds.  Extremities: No  clubbing cyanosis or edema with positive pedal pulses.          Discharge Orders    Future Appointments: Provider: Department: Dept Phone: Center:   01/20/2013 10:00 AM [facility] [phone] None   01/20/2013 10:30 AM [facility] SITE 3 ECHO LAB [phone] None   03/03/2013 8:00 AM [facility] [employer] Main [facility] [place]) [phone] [facility]   06/03/2013 9:00 AM [facility] Lab 2 [facility] [phone] [facility]   06/03/2013 9:30 AM [facility] [phone] [facility]   06/03/2013 10:40 AM Evern Bio, NP [facility] [phone] [facility]     Future Orders Please Complete By Expires   Diet - low sodium heart healthy      Increase activity slowly           Signed: Richarda Overlie 01/15/2013, 11:58 AM

## 2013-01-15 NOTE — Progress Notes (Signed)
NCM spoke to pt and he requested [facility], [phone] for his outpt PT/OT. Attempted call at 12:30 pm to rehab and closed for lunch. Contacted [facility] and they do provide Outpt PT/OT. Faxed referral, facesheet, orders and dc summary to [facility]. They will call to arrange appt times with pt. Contacted pt to give contact number to follow up for outpt therapy. Isidoro Donning RN CCM Case Mgmt phone [phone]

## 2013-01-19 NOTE — ED Provider Notes (Signed)
I saw and evaluated the patient, reviewed the resident's note and I agree with the findings and plan.  72 year old male with cervical diplopia and left-sided weakness. On exam patient does have some left upper and left lower extremity weakness. CT of his head does not show any acute abnormality. Patient cannot undergo an MRI secondary to hardware. A TPA candidate given therapeutic INR. Patient was evaluated by neurology. Will defer further stroke workup.  Raeford Razor, MD 01/19/13 [phone]

## 2013-01-20 ENCOUNTER — Other Ambulatory Visit (HOSPITAL_COMMUNITY): Payer: Medicare Other

## 2013-02-11 ENCOUNTER — Other Ambulatory Visit: Payer: Self-pay | Admitting: *Deleted

## 2013-02-11 MED ORDER — DABIGATRAN ETEXILATE MESYLATE 150 MG PO CAPS: 150.0000 mg | ORAL_CAPSULE | Freq: Two times a day (BID) | ORAL | Status: DC

## 2013-02-11 NOTE — Telephone Encounter (Signed)
Pt states hospital took him off his Coumadin and put on Pradaxa 150 sent to [facility].

## 2013-02-25 ENCOUNTER — Ambulatory Visit: Payer: Self-pay | Admitting: Internal Medicine

## 2013-03-03 ENCOUNTER — Encounter: Payer: Self-pay | Admitting: Internal Medicine

## 2013-03-03 ENCOUNTER — Other Ambulatory Visit: Payer: Self-pay | Admitting: Internal Medicine

## 2013-03-03 ENCOUNTER — Ambulatory Visit (INDEPENDENT_AMBULATORY_CARE_PROVIDER_SITE_OTHER): Payer: Medicare Other | Admitting: *Deleted

## 2013-03-03 DIAGNOSIS — I2589 Other forms of chronic ischemic heart disease: Secondary | ICD-10-CM

## 2013-03-03 DIAGNOSIS — I5022 Chronic systolic (congestive) heart failure: Secondary | ICD-10-CM

## 2013-03-03 DIAGNOSIS — Z95810 Presence of automatic (implantable) cardiac defibrillator: Secondary | ICD-10-CM

## 2013-03-05 LAB — REMOTE ICD DEVICE
AL AMPLITUDE: 5 mv
AL IMPEDENCE ICD: 490 Ohm
AL THRESHOLD: 0.75 V
ATRIAL PACING ICD: 91 pct
DEV-0020ICD: NEGATIVE
DEVICE MODEL ICD: 1075181
RV LEAD AMPLITUDE: 12 mv
TZON-0005SLOWVT: 6
TZON-0010SLOWVT: 40 ms
VENTRICULAR PACING ICD: 96 pct

## 2013-03-25 ENCOUNTER — Encounter: Payer: Self-pay | Admitting: *Deleted

## 2013-03-29 ENCOUNTER — Emergency Department (HOSPITAL_COMMUNITY): Payer: Medicare Other

## 2013-03-29 ENCOUNTER — Encounter (HOSPITAL_COMMUNITY): Payer: Self-pay

## 2013-03-29 ENCOUNTER — Emergency Department (HOSPITAL_COMMUNITY)
Admission: EM | Admit: 2013-03-29 | Discharge: 2013-03-29 | Disposition: A | Discharge status: Home / Self Care | Payer: Medicare Other | Attending: Emergency Medicine | Admitting: Emergency Medicine

## 2013-03-29 DIAGNOSIS — Z87891 Personal history of nicotine dependence: Secondary | ICD-10-CM | POA: Insufficient documentation

## 2013-03-29 DIAGNOSIS — J449 Chronic obstructive pulmonary disease, unspecified: Secondary | ICD-10-CM

## 2013-03-29 DIAGNOSIS — R0600 Dyspnea, unspecified: Secondary | ICD-10-CM

## 2013-03-29 DIAGNOSIS — Z87448 Personal history of other diseases of urinary system: Secondary | ICD-10-CM | POA: Insufficient documentation

## 2013-03-29 DIAGNOSIS — Z951 Presence of aortocoronary bypass graft: Secondary | ICD-10-CM | POA: Insufficient documentation

## 2013-03-29 DIAGNOSIS — I10 Essential (primary) hypertension: Secondary | ICD-10-CM | POA: Insufficient documentation

## 2013-03-29 DIAGNOSIS — I2510 Atherosclerotic heart disease of native coronary artery without angina pectoris: Secondary | ICD-10-CM | POA: Insufficient documentation

## 2013-03-29 DIAGNOSIS — Z8679 Personal history of other diseases of the circulatory system: Secondary | ICD-10-CM | POA: Insufficient documentation

## 2013-03-29 DIAGNOSIS — R0989 Other specified symptoms and signs involving the circulatory and respiratory systems: Secondary | ICD-10-CM | POA: Insufficient documentation

## 2013-03-29 DIAGNOSIS — R0609 Other forms of dyspnea: Secondary | ICD-10-CM | POA: Insufficient documentation

## 2013-03-29 DIAGNOSIS — Z87442 Personal history of urinary calculi: Secondary | ICD-10-CM | POA: Insufficient documentation

## 2013-03-29 DIAGNOSIS — Z8639 Personal history of other endocrine, nutritional and metabolic disease: Secondary | ICD-10-CM | POA: Insufficient documentation

## 2013-03-29 DIAGNOSIS — Z79899 Other long term (current) drug therapy: Secondary | ICD-10-CM | POA: Insufficient documentation

## 2013-03-29 DIAGNOSIS — R059 Cough, unspecified: Secondary | ICD-10-CM | POA: Insufficient documentation

## 2013-03-29 DIAGNOSIS — I252 Old myocardial infarction: Secondary | ICD-10-CM | POA: Insufficient documentation

## 2013-03-29 DIAGNOSIS — R05 Cough: Secondary | ICD-10-CM | POA: Insufficient documentation

## 2013-03-29 DIAGNOSIS — Z862 Personal history of diseases of the blood and blood-forming organs and certain disorders involving the immune mechanism: Secondary | ICD-10-CM | POA: Insufficient documentation

## 2013-03-29 DIAGNOSIS — I4891 Unspecified atrial fibrillation: Secondary | ICD-10-CM | POA: Insufficient documentation

## 2013-03-29 DIAGNOSIS — Z95810 Presence of automatic (implantable) cardiac defibrillator: Secondary | ICD-10-CM | POA: Insufficient documentation

## 2013-03-29 DIAGNOSIS — Z8673 Personal history of transient ischemic attack (TIA), and cerebral infarction without residual deficits: Secondary | ICD-10-CM | POA: Insufficient documentation

## 2013-03-29 DIAGNOSIS — I509 Heart failure, unspecified: Secondary | ICD-10-CM | POA: Insufficient documentation

## 2013-03-29 DIAGNOSIS — J441 Chronic obstructive pulmonary disease with (acute) exacerbation: Secondary | ICD-10-CM | POA: Insufficient documentation

## 2013-03-29 DIAGNOSIS — IMO0002 Reserved for concepts with insufficient information to code with codable children: Secondary | ICD-10-CM | POA: Insufficient documentation

## 2013-03-29 LAB — CBC WITH DIFFERENTIAL/PLATELET
Eosinophils Absolute: 0.2 10*3/uL (ref 0.0–0.7)
HCT: 43.4 % (ref 39.0–52.0)
Hemoglobin: 14.6 g/dL (ref 13.0–17.0)
Lymphs Abs: 0.9 10*3/uL (ref 0.7–4.0)
MCH: 31.1 pg (ref 26.0–34.0)
MCHC: 33.6 g/dL (ref 30.0–36.0)
Monocytes Absolute: 0.2 10*3/uL (ref 0.1–1.0)
Monocytes Relative: 2 % — ABNORMAL LOW (ref 3–12)
Neutrophils Relative %: 87 % — ABNORMAL HIGH (ref 43–77)
RBC: 4.7 MIL/uL (ref 4.22–5.81)

## 2013-03-29 LAB — BASIC METABOLIC PANEL
BUN: 17 mg/dL (ref 6–23)
Chloride: 109 mEq/L (ref 96–112)
Creatinine, Ser: 1.24 mg/dL (ref 0.50–1.35)
Glucose, Bld: 131 mg/dL — ABNORMAL HIGH (ref 70–99)
Potassium: 3.6 mEq/L (ref 3.5–5.1)

## 2013-03-29 LAB — POCT I-STAT TROPONIN I

## 2013-03-29 MED ORDER — FUROSEMIDE 10 MG/ML IJ SOLN
40.0000 mg | Freq: Once | INTRAMUSCULAR | Status: AC
  Administered 2013-03-29: 40 mg via INTRAVENOUS
  Filled 2013-03-29: qty 4

## 2013-03-29 MED ORDER — ALBUTEROL SULFATE HFA 108 (90 BASE) MCG/ACT IN AERS: 2.0000 | INHALATION_SPRAY | RESPIRATORY_TRACT | Status: DC | PRN

## 2013-03-29 MED ORDER — ALBUTEROL SULFATE (5 MG/ML) 0.5% IN NEBU
5.0000 mg | INHALATION_SOLUTION | Freq: Once | RESPIRATORY_TRACT | Status: AC
  Administered 2013-03-29: 5 mg via RESPIRATORY_TRACT
  Filled 2013-03-29 (×2): qty 0.5

## 2013-03-29 MED ORDER — IPRATROPIUM BROMIDE 0.02 % IN SOLN
0.5000 mg | Freq: Once | RESPIRATORY_TRACT | Status: AC
  Administered 2013-03-29: 0.5 mg via RESPIRATORY_TRACT
  Filled 2013-03-29: qty 2.5

## 2013-03-29 MED ORDER — PREDNISONE 20 MG PO TABS: 60.0000 mg | ORAL_TABLET | Freq: Every day | ORAL | Status: DC

## 2013-03-29 NOTE — ED Provider Notes (Addendum)
Sign out from Dr Radford Pax, w tentative plan to d/c home if not hypoxic, indicating pt prefers d/c home. (note prior blank note in epic from myself deleted, was inadvertent, related to another ed pt)  Pt w hx copd, cad, ischemic cm/chf/ef 35-40%, w dyspnea for past few days. States recent non prod cough, wheezing. ?orthopnea. Pt states compliant w meds. Unaware of any recent wt change. Denies current or recent chest pain. No leg pain or swelling. On pradaxa.    After symptoms, constant for 2-3 days, trop 0. bnp elev compared to prior w vascular cong on cxr - pt given extra dose of his lasix iv. Pt also notes relief of symptoms w albuterol neb. Pt given steroid rx pta. On discussions w pt/family, including discussion/offering admit vs d/c, pt indicates dyspnea much improved, and requests d/c.  Pt ambulates in ed on room air, pulse ox 94-95%.  Discussed pt with cardiologist on call for his - Dr Patel/leb card responded. Pt discussed. He agrees w plan of d/c, pt to take extra dose of his lasix tomorrow, and to follow up Monday for recheck.  Pt indicates he has adequate of alb mdi, neb and lasix at home.   Pt appears stable for d/c     Suzi Roots, MD 03/29/13 1821

## 2013-03-29 NOTE — ED Provider Notes (Deleted)
telepsych has evaluated pt and states is psychiatrically stable for d/c, the psych md reversed ivc, and states will need sw eval for placement.  Pt alert, content, cooperative, oriented.  Pt agreesable to ecf placement. Given age, general deconditioning and mobility issues, do not feel shelter is reasonable option for this patient.  Pt states no family or friends in area with whom he could stay. Pt previous at a [facility] in [place]. Discussed pt with SW, they will work on placement of patient.   Suzi Roots, MD 03/29/13 [phone]

## 2013-03-29 NOTE — ED Provider Notes (Signed)
History     CSN: [id number]  Arrival date & time 03/29/13  1318   First MD Initiated Contact with Patient 03/29/13 1341      Chief Complaint  Patient presents with  . Shortness of Breath    HPI Pt seen today at [facility] for c/o SOB and non-productive cough. X-Ray confirmed pneumonia. Meds given at [facility] include 12mg  Decadron and Rocephin IM. Duo-neb given x 1.  Patient has had a cough since last night associated with chills but no documented fever.  History of pneumonia in the past.  Patient has long cardiac history.  Has an pacemaker defibrillator in place.  Patient has noticed some wheezing for the last 24 hours.  Patient did receive an injection of Rocephin and Decadron prior to being transported to the hospital.  Does have history of COPD but takes no regular medicines for it. Pt's O2 sats 94-96% on 2L O2 via [place].  Past Medical History  Diagnosis Date  . Atrial fibrillation     s/p prior DCCV;  Amiodarone Rx.  . CAD (coronary artery disease)     s/p CABG;  cath 10/07:  LM ok, LAD occluded, AV CFX 95%, pRCA occluded; L-LAD, S-OM2/OM3, S-PDA ok  . Cerebrovascular disease     s/p prior Left CEA;  followed by Dr. [name]  . COPD (chronic obstructive pulmonary disease)   . Hyperlipidemia   . Hypertension   . Mitral insufficiency     hx of. not noted on echo 01/2012  . Nephrolithiasis   . Ischemic cardiomyopathy     echo 5/09: EF 55%, mild LAE;  Myoview 11/10: inf and apical scar with mild peri-infarct ischemia, EF 44%. Echo 01/26/12 with EF 20-25% but previously known to be 30% on echoes before last one  . Iliac artery aneurysm, left     followed by Dr. [name]  . Aortic dissection     H/O focal aortic dissection  . LBBB (left bundle branch block)   . CHF (congestive heart failure)   . Myocardial infarction   . Renal insufficiency     01/2012    Past Surgical History  Procedure Laterality Date  . Uteroscopy    . Coronary artery bypass graft  1998  . Left cea  07/05   . Appendectomy  09/11/11  . Carotid endarterectomy  2005    left  . Biventricular defibrillator implantation  08/22/12    SJM Quadra Assura BiV ICD implanted by Dr [name]    Family History  Problem Relation Age of Onset  . Stroke Maternal Uncle     History  Substance Use Topics  . Smoking status: Former Smoker -- 1.50 packs/day for 57 years    Types: Cigarettes    Quit date: 12/11/2004  . Smokeless tobacco: Never Used  . Alcohol Use: No     Comment: 6 yrs ago       Review of Systems All other systems reviewed and are negative Allergies  Review of patient's allergies indicates no known allergies.  Home Medications   Current Outpatient Rx  Name  Route  Sig  Dispense  Refill  . amiodarone (PACERONE) 200 MG tablet   Oral   Take 200 mg by mouth daily.         [name] amLODipine (NORVASC) 10 MG tablet   Oral   Take 5 mg by mouth daily.         . dabigatran (PRADAXA) 150 MG CAPS   Oral   Take  150 mg by mouth every 12 (twelve) hours.         . furosemide (LASIX) 40 MG tablet   Oral   Take 40 mg by mouth daily.         . hydrALAZINE (APRESOLINE) 25 MG tablet   Oral   Take 25 mg by mouth 2 (two) times daily.         [name] lisinopril (PRINIVIL,ZESTRIL) 40 MG tablet   Oral   Take 40 mg by mouth daily.         . metoprolol succinate (TOPROL-XL) 25 MG 24 hr tablet   Oral   Take 12.5 mg by mouth daily.         . Multiple Vitamin (MULITIVITAMIN WITH MINERALS) TABS   Oral   Take 1 tablet by mouth daily.         . niacin (NIASPAN) 500 MG CR tablet   Oral   Take 500 mg by mouth 2 (two) times daily.          . potassium chloride SA (KLOR-CON M20) 20 MEQ tablet   Oral   Take 40 mEq by mouth 3 (three) times daily.          . pravastatin (PRAVACHOL) 80 MG tablet   Oral   Take 80 mg by mouth daily.         [name] albuterol (PROVENTIL HFA;VENTOLIN HFA) 108 (90 BASE) MCG/ACT inhaler   Inhalation   Inhale 2 puffs into the lungs every 4 (four) hours as  needed for wheezing.   1 Inhaler   0   . nitroGLYCERIN (NITROSTAT) 0.4 MG SL tablet   Sublingual   Place 0.4 mg under the tongue every 5 (five) minutes as needed for chest pain.          . predniSONE (DELTASONE) 20 MG tablet   Oral   Take 3 tablets (60 mg total) by mouth daily.   12 tablet   0     BP 120/95  Temp(Src) 98.5 F (36.9 C) (Oral)  Resp 19  SpO2 96%  Physical Exam  Nursing note and vitals reviewed. Constitutional: He is oriented to person, place, and time. He appears well-developed and well-nourished. No distress.  HENT:  Head: Normocephalic and atraumatic.  Eyes: Pupils are equal, round, and reactive to light.  Neck: Normal range of motion.  Cardiovascular: Normal rate and intact distal pulses.   Pulmonary/Chest: No respiratory distress. He has wheezes (Scattered expiratory).  Abdominal: Normal appearance. He exhibits no distension. There is no tenderness. There is no rebound and no guarding.  Musculoskeletal: Normal range of motion.  Neurological: He is alert and oriented to person, place, and time. No cranial nerve deficit.  Skin: Skin is warm and dry. No rash noted.  Psychiatric: He has a normal mood and affect. His behavior is normal.    ED Course  Procedures (including critical care time) Meds ordered this encounter  Medications  . albuterol (PROVENTIL) (5 MG/ML) 0.5% nebulizer solution 5 mg    Sig:   . ipratropium (ATROVENT) nebulizer solution 0.5 mg    Sig:     Labs Reviewed  CBC WITH DIFFERENTIAL - Abnormal; Notable for the following:    Platelets 136 (*)    Neutrophils Relative 87 (*)    Neutro Abs 8.6 (*)    Lymphocytes Relative 9 (*)    Monocytes Relative 2 (*)    All other components within normal limits  BASIC METABOLIC PANEL - Abnormal; Notable for the  following:    Sodium 146 (*)    Glucose, Bld 131 (*)    GFR calc non Af Amer 57 (*)    GFR calc Af Amer 66 (*)    All other components within normal limits  PRO B NATRIURETIC  PEPTIDE - Abnormal; Notable for the following:    Pro B Natriuretic peptide (BNP) 1154.0 (*)    All other components within normal limits  POCT I-STAT TROPONIN I   Dg Chest 2 View  03/29/2013  *RADIOLOGY REPORT*  Clinical Data: Shortness of breath, cough, congestion.  CHEST - 2 VIEW  Comparison: 08/23/2012  Findings: Cardiomegaly.  Prior CABG.  Mild vascular congestion. Probable bibasilar atelectasis.  No effusions or acute bony abnormality.  IMPRESSION: Cardiomegaly with vascular congestion and bibasilar atelectasis.   Original Report Authenticated By: Charlett Nose, M.D.      1. Dyspnea   2. CHF (congestive heart failure)   3. COPD (chronic obstructive pulmonary disease)       MDM  Will check BNP and O2 saturation on room air prior to decision about discharge       Nelia Shi, MD 04/12/13 [phone]

## 2013-03-29 NOTE — ED Notes (Signed)
Pt seen today at [facility] for c/o SOB and non-productive cough.  X-Ray confirmed pneumonia.  Meds given at [facility] include 12mg  Decadron and Rocephin IM.  Duo-neb given x 1.  Pt's O2 sats 94-96% on 2L O2 via [place].  20G to L FA.

## 2013-03-29 NOTE — ED Notes (Signed)
Pt was walked on monitor, pulse ox got down to 93%.

## 2013-04-14 ENCOUNTER — Encounter: Payer: Self-pay | Admitting: Neurology

## 2013-04-14 ENCOUNTER — Ambulatory Visit (INDEPENDENT_AMBULATORY_CARE_PROVIDER_SITE_OTHER): Payer: Medicare Other | Admitting: Neurology

## 2013-04-14 VITALS — BP 168/87 | HR 63 | Temp 97.6°F | Ht 73.0 in | Wt 264.0 lb

## 2013-04-14 DIAGNOSIS — G459 Transient cerebral ischemic attack, unspecified: Secondary | ICD-10-CM

## 2013-04-14 NOTE — Progress Notes (Signed)
[facility]  PATIENT: [name] [name] DOB: 1941/03/04   HISTORY FROM: patient REASON FOR VISIT: TIA hospital followup.  HISTORICAL  HISTORY OF PRESENT ILLNESS:  [name] [name] is an 72 y.o. Caucasian Male who returns to our office for hospital admission follow up after 2 TIA episodes which lasted approximately 15 minutes each on 01/13/2013. During the TIAs patient experienced dizziness and vertical double vision. He experienced left-sided numbness and weakness. Patient was admitted as a code stroke for further evaluation and treatment.  He was not a TPA candidate secondary to therapeutic INR of 2.1 on Coumadin. Patient has no complaints today.  He completed PT at [facility] and is not experiencing any numbness, weakness, or double vision that he experienced with the TIAs.  He was transitioned to Pradaxa after discharge and has had no complications.    REVIEW OF SYSTEMS: Full 14 system review of systems performed and notable only for easy bruising, easy bleeding, blurred vision, shortness of breath, cough, wheezing, and impotence.  ALLERGIES: No Known Allergies  HOME MEDICATIONS: Outpatient Prescriptions Prior to Visit  Medication Sig Dispense Refill  . albuterol (PROVENTIL HFA;VENTOLIN HFA) 108 (90 BASE) MCG/ACT inhaler Inhale 2 puffs into the lungs every 4 (four) hours as needed for wheezing.  1 Inhaler  0  . amiodarone (PACERONE) 200 MG tablet Take 200 mg by mouth daily.      [name] amLODipine (NORVASC) 10 MG tablet Take 5 mg by mouth daily.      . dabigatran (PRADAXA) 150 MG CAPS Take 150 mg by mouth every 12 (twelve) hours.      . furosemide (LASIX) 40 MG tablet Take 40 mg by mouth daily.      . hydrALAZINE (APRESOLINE) 25 MG tablet Take 25 mg by mouth 2 (two) times daily.      [name] lisinopril (PRINIVIL,ZESTRIL) 40 MG tablet Take 40 mg by mouth daily.      . metoprolol succinate (TOPROL-XL) 25 MG 24 hr tablet Take 12.5 mg by mouth daily.      . Multiple  Vitamin (MULITIVITAMIN WITH MINERALS) TABS Take 1 tablet by mouth daily.      . niacin (NIASPAN) 500 MG CR tablet Take 500 mg by mouth 2 (two) times daily.       . nitroGLYCERIN (NITROSTAT) 0.4 MG SL tablet Place 0.4 mg under the tongue every 5 (five) minutes as needed for chest pain.       . potassium chloride SA (KLOR-CON M20) 20 MEQ tablet Take 40 mEq by mouth 3 (three) times daily.       . pravastatin (PRAVACHOL) 80 MG tablet Take 80 mg by mouth daily.      . predniSONE (DELTASONE) 20 MG tablet Take 3 tablets (60 mg total) by mouth daily.  12 tablet  0   No facility-administered medications prior to visit.    PAST MEDICAL HISTORY: Past Medical History  Diagnosis Date  . Atrial fibrillation     s/p prior DCCV;  Amiodarone Rx.  . CAD (coronary artery disease)     s/p CABG;  cath 10/07:  LM ok, LAD occluded, AV CFX 95%, pRCA occluded; L-LAD, S-OM2/OM3, S-PDA ok  . Cerebrovascular disease     s/p prior Left CEA;  followed by Dr. [name]  . COPD (chronic obstructive pulmonary disease)   . Hyperlipidemia   . Hypertension   . Mitral insufficiency     hx of. not noted on echo 01/2012  . Nephrolithiasis   . Ischemic  cardiomyopathy     echo 5/09: EF 55%, mild LAE;  Myoview 11/10: inf and apical scar with mild peri-infarct ischemia, EF 44%. Echo 01/26/12 with EF 20-25% but previously known to be 30% on echoes before last one  . Iliac artery aneurysm, left     followed by Dr. [name]  . Aortic dissection     H/O focal aortic dissection  . LBBB (left bundle branch block)   . CHF (congestive heart failure)   . Myocardial infarction   . Renal insufficiency     01/2012    PAST SURGICAL HISTORY: Past Surgical History  Procedure Laterality Date  . Uteroscopy    . Coronary artery bypass graft  1998  . Left cea  07/05  . Appendectomy  09/11/11  . Carotid endarterectomy  2005    left  . Biventricular defibrillator implantation  08/22/12    SJM Quadra Assura BiV ICD implanted by Dr [name]     FAMILY HISTORY: Family History  Problem Relation Age of Onset  . Stroke Maternal Uncle     SOCIAL HISTORY:  History   Social History  . Marital Status: Married    Spouse Name: N/A    Number of Children: 3  . Years of Education: N/A   Occupational History  . RETIRED---truck driver    Social History Main Topics  . Smoking status: Former Smoker -- 1.50 packs/day for 57 years    Types: Cigarettes    Quit date: 12/11/2004  . Smokeless tobacco: Never Used  . Alcohol Use: No     Comment: 6 yrs ago   . Drug Use: No  . Sexually Active: No   Other Topics Concern  . Not on file   Social History Narrative   Lives in [name], [place] with wife.    Caffeine Use: Does drink coffee     PHYSICAL EXAM  Filed Vitals:   04/14/13 1451  BP: 168/87  Pulse: 63  Temp: 97.6 F (36.4 C)  TempSrc: Oral  Height: 6\' 1"  (1.854 m)  Weight: 264 lb (119.75 kg)   Body mass index is 34.84 kg/(m^2).  GENERAL EXAM: Patient is in no distress.  CARDIOVASCULAR: Irregular rate and rhythm, no murmurs, no carotid bruits  NEUROLOGIC: MENTAL STATUS: awake, alert, language fluent, comprehension intact, naming intact CRANIAL NERVE:  pupils equal and reactive to light, visual fields full to confrontation, extraocular muscles intact, no nystagmus, facial sensation and strength symmetric, uvula midline, shoulder shrug symmetric, tongue midline. MOTOR: normal bulk and tone, full strength in the BUE, BLE SENSORY: normal and symmetric to light touch, temperature, vibration COORDINATION: finger-nose-finger, fine finger movements normal REFLEXES: deep tendon reflexes present and symmetric GAIT/STATION: narrow based gait; able to walk on toes, heels; romberg is negative. Difficulty with tandem gait.   DIAGNOSTIC DATA (LABS, IMAGING, TESTING) - I reviewed patient records, labs, notes, testing and imaging myself where available.  Lab Results  Component Value Date   WBC 9.9 03/29/2013   HGB 14.6  03/29/2013   HCT 43.4 03/29/2013   MCV 92.3 03/29/2013   PLT 136* 03/29/2013      Component Value Date/Time   NA 146* 03/29/2013 1407   K 3.6 03/29/2013 1407   CL 109 03/29/2013 1407   CO2 27 03/29/2013 1407   GLUCOSE 131* 03/29/2013 1407   BUN 17 03/29/2013 1407   CREATININE 1.24 03/29/2013 1407   CREATININE 1.36* 11/27/2011 1124   CALCIUM 8.7 03/29/2013 1407   PROT 6.5 01/13/2013 1921   ALBUMIN 3.6  01/13/2013 1921   AST 24 01/13/2013 1921   ALT 17 01/13/2013 1921   ALKPHOS 94 01/13/2013 1921   BILITOT 0.4 01/13/2013 1921   GFRNONAA 57* 03/29/2013 1407   GFRAA 66* 03/29/2013 1407   Lab Results  Component Value Date   CHOL 131 01/14/2013   HDL 42 01/14/2013   LDLCALC 60 01/14/2013   LDLDIRECT 91.8 04/28/2010   TRIG 143 01/14/2013   CHOLHDL 3.1 01/14/2013   Lab Results  Component Value Date   HGBA1C 5.6 01/14/2013   No results found for this basename: VITAMINB12   Lab Results  Component Value Date   TSH 0.59 07/16/2012    CT of the brain  01/13/2013 No intracranial hemorrhage.  Prominent small vessel disease type changes without CT evidence of large acute infarct.  Difficult to exclude small acute infarct in this setting.  One area which appears new from the prior examination is a small right posterior frontal lobe infarct of indeterminate age (series 2 image 20).  Paranasal sinus opacification.  MRI of the brain  Could not be completed due to implanted pacemaker.  CT angio head 01/14/2013  Atherosclerotic disease in the cavernous carotid bilaterally with moderate stenosis.  There is atherosclerotic disease of the distal vertebral arteries which are patent bilaterally.  No large vessel intracranial occlusion or aneurysm.    CT angio neck 01/14/2013   Diffuse atherosclerotic disease.  No significant carotid stenosis. Probable carotid endarterectomy on the left which is widely patent. Both vertebral arteries are patent with scattered atherosclerotic disease.    2D Echocardiogram  ejection fraction was in the  range of 25% to 30%. Diffuse hypokinesis. Regional wall motion abnormalities: There is akinesis of the basal-mid inferoposterior myocardium.  Carotid Doppler  No evidence of hemodynamically significant internal carotid artery stenosis. Vertebral artery flow is antegrade.    ASSESSMENT AND PLAN  [name] [name] is a 72 year old right-handed Caucasian male who experienced 2 posterior circulationTIA events on January 13, 2013. Each event lasted less than 30 minutes in which the patient experienced vertical diplopia, weakness and numbness in the left arm and leg. Patient was admitted to the hospital as a code stroke, CT of the brain showed no intracranial hemorrhage and prominent small vessel disease changes without evidence of a large acute infarct. A small right posterior frontal lobe infarct of indeterminate age was appreciated. MRI of the brain could not be completed due to implanted cardiac pacemaker. CT head and neck from February 4 shows diffuse atherosclerotic disease but no significant carotid stenosis.  Patient has completed outpatient rehab and is doing very well with no residual effects of the TIAs. [name] [name] has significant risk factors for stroke including atrial fibrillation, hypertension, hyperlipidemia, and obesity.   1. Continue Pradaxa 150mg  daily for secondary stroke prevention. Advised patient that this would be lifelong therapy. 2. BP goal is less than 140/90, discussed with patient. 3. Cholesterol goal discussed with patient, total less than 200, LDL less than 100. 4. Encouraged patient to lose weight, exercise daily by briskly walking the dog for 30 minutes. 4. Return to clinic in 3 months for follow up. 5. Repeat carotid dopplers in 6 months.  Idrees Quam NP-C 04/14/2013, 3:57 PM  [facility] [address] [place], [place] [address] [phone] I have personally examined this patient, reviewed pertinent data, developed plan of care and agree with  above  Delia Heady, MD

## 2013-04-14 NOTE — Patient Instructions (Addendum)
Return for follow up visit in 3 months.       STROKE/TIA INSTRUCTIONS SMOKING Cigarette smoking nearly doubles your risk of having a stroke & is the single most alterable risk factor  If you smoke or have smoked in the last 12 months, you are advised to quit smoking for your health.  Most of the excess cardiovascular risk related to smoking disappears within a year of stopping.  Ask you doctor about anti-smoking medications  [place] Quit Line: 1-800-QUIT NOW  Free Smoking Cessation Classes [phone]  CHOLESTEROL Know your levels; limit fat & cholesterol in your diet  Lab Results  Component Value Date   CHOL 131 01/14/2013   HDL 42 01/14/2013   LDLCALC 60 01/14/2013   LDLDIRECT 91.8 04/28/2010   TRIG 143 01/14/2013   CHOLHDL 3.1 01/14/2013      Many patients benefit from treatment even if their cholesterol is at goal.  Goal: Total Cholesterol less than 160  Goal:  LDL less than 100  Goal:  HDL greater than 40  Goal:  Triglycerides less than 150  BLOOD PRESSURE American Stroke Association blood pressure target is less that 120/80 mm/Hg  Your discharge blood pressure is:  BP: 168/87 mmHg  Monitor your blood pressure  Limit your salt and alcohol intake  Many individuals will require more than one medication for high blood pressure  DIABETES (A1c is a blood sugar average for last 3 months) Goal A1c is under 7% (A1c is blood sugar average for last 3 months)  Diabetes: No known diagnosis of diabetes    Lab Results  Component Value Date   HGBA1C 5.6 01/14/2013    Your A1c can be lowered with medications, healthy diet, and exercise.  Check your blood sugar as directed by your physician  Call your physician if you experience unexplained or low blood sugars.  PHYSICAL ACTIVITY/REHABILITATION Goal is 30 minutes at least 4 days per week    Activity decreases your risk of heart attack and stroke and makes your heart stronger.  It helps control your weight and blood pressure; helps you  relax and can improve your mood.  Participate in a regular exercise program.  Talk with your doctor about the best form of exercise for you (dancing, walking, swimming, cycling).  DIET/WEIGHT Goal is to maintain a healthy weight  Your height is:  Height: 6\' 1"  (185.4 cm) Your current weight is: Weight: 264 lb (119.75 kg) Your body Mass Index (BMI) is:  BMI (Calculated): 34.9  Following the type of diet specifically designed for you will help prevent another stroke.  Your goal Body Mass Index (BMI) is 19-24.  Healthy food habits can help reduce 3 risk factors for stroke:  High cholesterol, hypertension, and excess weight.

## 2013-04-24 ENCOUNTER — Telehealth: Payer: Self-pay | Admitting: Neurology

## 2013-04-24 NOTE — Telephone Encounter (Signed)
Patient is taking Pradaxa 150 mg daily. Dr.Timothy Misenheimer  In [place]. Asked patient to call to see if he could stop rx for colonoscopy. Patient scheduled for June 17th.[name]

## 2013-04-24 NOTE — Telephone Encounter (Signed)
He had a TIA in Feb 2014 and is at maximum risk in first 6 months after that. Unless it is a emergency recommend postpone colonoscopy till after august 2014.

## 2013-04-25 NOTE — Telephone Encounter (Signed)
Called patient doctor office no answered. Called patient and let him  Know he could not have  colonoscopy till after august 2014.He had a TIA in Feb 2014 and is at maximum risk in first 6 months.

## 2013-04-30 ENCOUNTER — Telehealth: Payer: Self-pay | Admitting: Neurology

## 2013-05-12 ENCOUNTER — Other Ambulatory Visit: Payer: Self-pay | Admitting: *Deleted

## 2013-05-12 MED ORDER — HYDRALAZINE HCL 50 MG PO TABS: 50.0000 mg | ORAL_TABLET | Freq: Every day | ORAL | Status: DC

## 2013-05-12 MED ORDER — POTASSIUM CHLORIDE CRYS ER 20 MEQ PO TBCR: 40.0000 meq | EXTENDED_RELEASE_TABLET | Freq: Three times a day (TID) | ORAL | Status: DC

## 2013-05-12 NOTE — Telephone Encounter (Signed)
Fax Received. Refill Completed. [name] [name] (R.M.A)   

## 2013-05-15 ENCOUNTER — Telehealth: Payer: Self-pay | Admitting: Emergency Medicine

## 2013-05-15 NOTE — Telephone Encounter (Signed)
Patient stated that the pharmacy called stating they need information from [place].  [facility]ed [facility] and verified the rx for k+... [facility] will now send out patient's medication

## 2013-05-21 ENCOUNTER — Other Ambulatory Visit (INDEPENDENT_AMBULATORY_CARE_PROVIDER_SITE_OTHER): Payer: Medicare Other | Admitting: *Deleted

## 2013-05-21 ENCOUNTER — Encounter (INDEPENDENT_AMBULATORY_CARE_PROVIDER_SITE_OTHER): Payer: Medicare Other | Admitting: *Deleted

## 2013-05-21 DIAGNOSIS — Z48812 Encounter for surgical aftercare following surgery on the circulatory system: Secondary | ICD-10-CM

## 2013-05-21 DIAGNOSIS — I6529 Occlusion and stenosis of unspecified carotid artery: Secondary | ICD-10-CM

## 2013-05-21 DIAGNOSIS — I714 Abdominal aortic aneurysm, without rupture, unspecified: Secondary | ICD-10-CM

## 2013-05-22 ENCOUNTER — Other Ambulatory Visit: Payer: Self-pay | Admitting: *Deleted

## 2013-05-22 DIAGNOSIS — Z48812 Encounter for surgical aftercare following surgery on the circulatory system: Secondary | ICD-10-CM

## 2013-05-22 DIAGNOSIS — I714 Abdominal aortic aneurysm, without rupture: Secondary | ICD-10-CM

## 2013-05-23 ENCOUNTER — Encounter: Payer: Self-pay | Admitting: Vascular Surgery

## 2013-06-03 ENCOUNTER — Other Ambulatory Visit: Payer: Medicare Other

## 2013-06-03 ENCOUNTER — Ambulatory Visit: Payer: Medicare Other | Admitting: Neurosurgery

## 2013-06-04 ENCOUNTER — Telehealth: Payer: Self-pay | Admitting: Internal Medicine

## 2013-06-04 NOTE — Telephone Encounter (Signed)
New Problem:     Patient called in needing a refill of his pravastatin (PRAVACHOL) 80 MG tablet and furosemide (LASIX) 40 MG tablet.

## 2013-06-09 ENCOUNTER — Encounter: Payer: Medicare Other | Admitting: *Deleted

## 2013-06-11 ENCOUNTER — Encounter: Payer: Self-pay | Admitting: *Deleted

## 2013-06-12 ENCOUNTER — Other Ambulatory Visit: Payer: Self-pay | Admitting: *Deleted

## 2013-06-12 ENCOUNTER — Telehealth: Payer: Self-pay | Admitting: *Deleted

## 2013-06-12 MED ORDER — FUROSEMIDE 40 MG PO TABS: 40.0000 mg | ORAL_TABLET | Freq: Every day | ORAL | Status: DC

## 2013-06-12 NOTE — Telephone Encounter (Signed)
Pt needing refill for furosemide sent into [facility]

## 2013-06-16 ENCOUNTER — Other Ambulatory Visit: Payer: Self-pay | Admitting: *Deleted

## 2013-06-16 MED ORDER — FUROSEMIDE 40 MG PO TABS: 40.0000 mg | ORAL_TABLET | Freq: Every day | ORAL | Status: DC

## 2013-06-17 ENCOUNTER — Encounter: Payer: Self-pay | Admitting: Internal Medicine

## 2013-06-17 ENCOUNTER — Ambulatory Visit (INDEPENDENT_AMBULATORY_CARE_PROVIDER_SITE_OTHER): Payer: Medicare Other | Admitting: *Deleted

## 2013-06-17 DIAGNOSIS — I2589 Other forms of chronic ischemic heart disease: Secondary | ICD-10-CM

## 2013-06-17 DIAGNOSIS — Z95810 Presence of automatic (implantable) cardiac defibrillator: Secondary | ICD-10-CM

## 2013-06-17 DIAGNOSIS — I5022 Chronic systolic (congestive) heart failure: Secondary | ICD-10-CM

## 2013-06-18 LAB — REMOTE ICD DEVICE
ATRIAL PACING ICD: 86 pct
BAMS-0003: 70 {beats}/min
DEV-0020ICD: NEGATIVE
HV IMPEDENCE: 84 Ohm
MODE SWITCH EPISODES: 40
RV LEAD IMPEDENCE ICD: 460 Ohm
TZON-0003SLOWVT: 340 ms
TZON-0004SLOWVT: 30
TZON-0005SLOWVT: 6
VENTRICULAR PACING ICD: 97 pct

## 2013-06-27 ENCOUNTER — Encounter: Payer: Self-pay | Admitting: *Deleted

## 2013-07-03 ENCOUNTER — Other Ambulatory Visit: Payer: Self-pay | Admitting: *Deleted

## 2013-07-03 MED ORDER — PRAVASTATIN SODIUM 80 MG PO TABS: 80.0000 mg | ORAL_TABLET | Freq: Every day | ORAL | Status: DC

## 2013-07-14 ENCOUNTER — Encounter: Payer: Self-pay | Admitting: Nurse Practitioner

## 2013-07-14 ENCOUNTER — Ambulatory Visit (INDEPENDENT_AMBULATORY_CARE_PROVIDER_SITE_OTHER): Payer: Medicare Other | Admitting: Nurse Practitioner

## 2013-07-14 VITALS — BP 144/77 | HR 74 | Temp 98.5°F | Ht 72.5 in | Wt 261.0 lb

## 2013-07-14 DIAGNOSIS — I10 Essential (primary) hypertension: Secondary | ICD-10-CM

## 2013-07-14 DIAGNOSIS — I4891 Unspecified atrial fibrillation: Secondary | ICD-10-CM

## 2013-07-14 DIAGNOSIS — Z7901 Long term (current) use of anticoagulants: Secondary | ICD-10-CM

## 2013-07-14 DIAGNOSIS — G459 Transient cerebral ischemic attack, unspecified: Secondary | ICD-10-CM

## 2013-07-14 NOTE — Patient Instructions (Addendum)
Continue Pradaxa  for secondary stroke prevention and maintain strict control of hypertension with blood pressure goal below 130/90, diabetes with hemoglobin A1c goal below 6.5% and lipids with LDL cholesterol goal below 100 mg/dL.   Followup in the future with me in 6 months.

## 2013-07-14 NOTE — Progress Notes (Signed)
[facility]  PATIENT: [name] [name] DOB: 1941-11-02   HISTORY FROM: patient, chart REASON FOR VISIT: routine follow up  HISTORY OF PRESENT ILLNESS:  [name] [name] is an 72 y.o. Caucasian Male who returns to our office for hospital admission follow up after 2 TIA episodes which lasted approximately 15 minutes each on 01/13/2013. During the TIAs patient experienced dizziness and vertical double vision. He experienced left-sided numbness and weakness. Patient was admitted as a code stroke for further evaluation and treatment. He was not a TPA candidate secondary to therapeutic INR of 2.1 on Coumadin. Patient has no complaints today. He completed PT at [facility] and is not experiencing any numbness, weakness, or double vision that he experienced with the TIAs. He was transitioned to Pradaxa after discharge and has had no complications.   UPDATE 07/14/13 (LL):    Patient returns to the office for follow up since last visit on 04/14/13.  He has no new neurovascular complaints, and says he has been doing well.  He has an upcoming checkup appointment with Dr. [name], cardiology, Sept. 18 and sees Dr. [name] soon as well.  He is still taking daily Pradaxa for history of afib.  Patient denies medication side effects, with no signs of bleeding or excessive bruising.  He states he does not check his BP at home unless he does not feel well.  BP in office today is 144/77.  He states he exercises by walking his dog in the neighborhood.  REVIEW OF SYSTEMS: Full 14 system review of systems performed and notable only for: constitutional: N/A  cardiovascular: N/A respiratory: short of breath endocrine: N/A  ear/nose/throat: N/A  musculoskeletal: N/A Hematology/Lymph: easy bruising, easy bleeding skin: N/A genitourinary: N/A Gastrointestinal: N/A allergy/immunology: N/A neurological: N/A sleep: N/A psychiatric: N/A   ALLERGIES: No Known Allergies  HOME  MEDICATIONS: Outpatient Prescriptions Prior to Visit  Medication Sig Dispense Refill  . albuterol (PROVENTIL HFA;VENTOLIN HFA) 108 (90 BASE) MCG/ACT inhaler Inhale 2 puffs into the lungs every 4 (four) hours as needed for wheezing.  1 Inhaler  0  . amiodarone (PACERONE) 200 MG tablet Take 200 mg by mouth daily.      [name] Kitchen amLODipine (NORVASC) 10 MG tablet Take 5 mg by mouth daily.      . dabigatran (PRADAXA) 150 MG CAPS Take 150 mg by mouth every 12 (twelve) hours.      . furosemide (LASIX) 40 MG tablet Take 1 tablet (40 mg total) by mouth daily.  90 tablet  4  . hydrALAZINE (APRESOLINE) 50 MG tablet Take 1 tablet (50 mg total) by mouth daily.  90 tablet  3  . lisinopril (PRINIVIL,ZESTRIL) 40 MG tablet Take 40 mg by mouth daily.      . metoprolol succinate (TOPROL-XL) 25 MG 24 hr tablet Take 12.5 mg by mouth daily.      . Multiple Vitamin (MULITIVITAMIN WITH MINERALS) TABS Take 1 tablet by mouth daily.      . niacin (NIASPAN) 500 MG CR tablet Take 500 mg by mouth 2 (two) times daily.       . nitroGLYCERIN (NITROSTAT) 0.4 MG SL tablet Place 0.4 mg under the tongue every 5 (five) minutes as needed for chest pain.       . potassium chloride SA (KLOR-CON M20) 20 MEQ tablet Take 2 tablets (40 mEq total) by mouth 3 (three) times daily.  540 tablet  3  . pravastatin (PRAVACHOL) 80 MG tablet Take 1 tablet (80 mg total) by  mouth daily.  90 tablet  0  . predniSONE (DELTASONE) 20 MG tablet Take 3 tablets (60 mg total) by mouth daily.  12 tablet  0   No facility-administered medications prior to visit.    PAST MEDICAL HISTORY: Past Medical History  Diagnosis Date  . Atrial fibrillation     s/p prior DCCV;  Amiodarone Rx.  . CAD (coronary artery disease)     s/p CABG;  cath 10/07:  LM ok, LAD occluded, AV CFX 95%, pRCA occluded; L-LAD, S-OM2/OM3, S-PDA ok  . Cerebrovascular disease     s/p prior Left CEA;  followed by Dr. [name]  . COPD (chronic obstructive pulmonary disease)   . Hyperlipidemia   .  Hypertension   . Mitral insufficiency     hx of. not noted on echo 01/2012  . Nephrolithiasis   . Ischemic cardiomyopathy     echo 5/09: EF 55%, mild LAE;  Myoview 11/10: inf and apical scar with mild peri-infarct ischemia, EF 44%. Echo 01/26/12 with EF 20-25% but previously known to be 30% on echoes before last one  . Iliac artery aneurysm, left     followed by Dr. [name]  . Aortic dissection     H/O focal aortic dissection  . LBBB (left bundle branch block)   . CHF (congestive heart failure)   . Myocardial infarction   . Renal insufficiency     01/2012    PAST SURGICAL HISTORY: Past Surgical History  Procedure Laterality Date  . Uteroscopy    . Coronary artery bypass graft  1998  . Left cea  07/05  . Appendectomy  09/11/11  . Carotid endarterectomy  2005    left  . Biventricular defibrillator implantation  08/22/12    SJM Quadra Assura BiV ICD implanted by Dr [name]    FAMILY HISTORY: Family History  Problem Relation Age of Onset  . Stroke Maternal Uncle     SOCIAL HISTORY: History   Social History  . Marital Status: Married    Spouse Name: N/A    Number of Children: 3  . Years of Education: N/A   Occupational History  . RETIRED---truck driver    Social History Main Topics  . Smoking status: Former Smoker -- 1.50 packs/day for 57 years    Types: Cigarettes    Quit date: 12/11/2004  . Smokeless tobacco: Never Used  . Alcohol Use: No     Comment: 6 yrs ago   . Drug Use: No  . Sexually Active: No   Other Topics Concern  . Not on file   Social History Narrative   Lives in [place], [place] with wife.    Caffeine Use: Does drink coffee     PHYSICAL EXAM  Filed Vitals:   07/14/13 1317  BP: 144/77  Pulse: 74  Temp: 98.5 F (36.9 C)  TempSrc: Oral  Height: 6' 0.5" (1.842 m)  Weight: 261 lb (118.389 kg)   Body mass index is 34.89 kg/(m^2).  Generalized: In no acute distress   Neck: Supple, no carotid bruits   Cardiac: Iregular rate rhythm, no  murmur   Pulmonary: Clear to auscultation bilaterally   Musculoskeletal: No deformity   Neurological examination   Mentation: Alert oriented to time, place, history taking, language fluent, and causual conversation  Cranial nerve II-XII: Pupils were equal round reactive to light extraocular movements were full, visual field were full on confrontational test. facial sensation and strength were normal. hearing was intact to finger rubbing bilaterally. Uvula tongue midline. head  turning and shoulder shrug and were normal and symmetric.Tongue protrusion into cheek strength was normal. MOTOR: normal bulk and tone, full strength in the BUE, BLE, fine finger movements normal, no pronator drift SENSORY: normal and symmetric to light touch, pinprick, temperature, vibration and proprioception COORDINATION: finger-nose-finger, heel-to-shin bilaterally, there was no truncal ataxia REFLEXES: Brachioradialis 2/2, biceps 2/2, triceps 2/2, patellar 1/1,  Achilles 1/1. GAIT/STATION: Rising up from seated position without assistance, normal stance, without trunk ataxia, moderate stride, good arm swing, smooth turning, able to perform tiptoe, and heel walking without difficulty. Tandem unsteady.  Romberg negative.   DIAGNOSTIC DATA (LABS, IMAGING, TESTING) - I reviewed patient records, labs, notes, testing and imaging myself where available.  Lab Results  Component Value Date   WBC 9.9 03/29/2013   HGB 14.6 03/29/2013   HCT 43.4 03/29/2013   MCV 92.3 03/29/2013   PLT 136* 03/29/2013      Component Value Date/Time   NA 146* 03/29/2013 1407   K 3.6 03/29/2013 1407   CL 109 03/29/2013 1407   CO2 27 03/29/2013 1407   GLUCOSE 131* 03/29/2013 1407   BUN 17 03/29/2013 1407   CREATININE 1.24 03/29/2013 1407   CREATININE 1.36* 11/27/2011 1124   CALCIUM 8.7 03/29/2013 1407   PROT 6.5 01/13/2013 1921   ALBUMIN 3.6 01/13/2013 1921   AST 24 01/13/2013 1921   ALT 17 01/13/2013 1921   ALKPHOS 94 01/13/2013 1921   BILITOT 0.4  01/13/2013 1921   GFRNONAA 57* 03/29/2013 1407   GFRAA 66* 03/29/2013 1407   Lab Results  Component Value Date   CHOL 131 01/14/2013   HDL 42 01/14/2013   LDLCALC 60 01/14/2013   TRIG 143 01/14/2013   CHOLHDL 3.1 01/14/2013   Lab Results  Component Value Date   HGBA1C 5.6 01/14/2013   CT of the brain 01/13/2013 No intracranial hemorrhage. Prominent small vessel disease type changes without CT evidence of large acute infarct. Difficult to exclude small acute infarct in this setting. One area which appears new from the prior examination is a small right posterior frontal lobe infarct of indeterminate age (series 2 image 20). Paranasal sinus opacification.  MRI of the brain Could not be completed due to implanted pacemaker.  CT angio head 01/14/2013 Atherosclerotic disease in the cavernous carotid bilaterally with moderate stenosis. There is atherosclerotic disease of the distal vertebral arteries which are patent bilaterally. No large vessel intracranial occlusion or aneurysm.  CT angio neck 01/14/2013 Diffuse atherosclerotic disease. No significant carotid stenosis. Probable carotid endarterectomy on the left which is widely patent. Both vertebral arteries are patent with scattered atherosclerotic disease.  2D Echocardiogram ejection fraction was in the range of 25% to 30%. Diffuse hypokinesis. Regional wall motion abnormalities: There is akinesis of the basal-mid inferoposterior myocardium.  Carotid Doppler No evidence of hemodynamically significant internal carotid artery stenosis. Vertebral artery flow is antegrade.   ASSESSMENT AND PLAN Mr. Slaven is a 72 year old right-handed Caucasian male who experienced 2 posterior circulationTIA events on January 13, 2013. Each event lasted less than 30 minutes in which the patient experienced vertical diplopia, weakness and numbness in the left arm and leg. CT head and neck from February 4 shows diffuse atherosclerotic disease but no significant carotid stenosis.   Patient is doing very well with no residual effects of the TIAs.   Mr. Vandenbrink has significant risk factors for stroke including atrial fibrillation, hypertension, hyperlipidemia, and obesity.   1. Continue Pradaxa 150mg  daily for secondary stroke prevention.  2. BP  goal is less than 140/90, discussed with patient.  3. Cholesterol goal discussed with patient, total less than 200, LDL less than 100.  4. Encouraged patient to lose weight, exercise daily by briskly walking the dog for 30 minutes.  4. Return to clinic in 6 months for follow up.  5. Repeat carotid dopplers in 6 months if not done at [facility].  Marelin Tat NP-C 07/14/2013, 1:22 PM  [facility] [address], [address] [place], [place] [address] [phone]  I have personally examined this patient, reviewed pertinent data, developed plan of care and discussed with patient and agree with above.  Delia Heady, MD

## 2013-07-15 ENCOUNTER — Other Ambulatory Visit: Payer: Self-pay | Admitting: *Deleted

## 2013-07-15 MED ORDER — LISINOPRIL 40 MG PO TABS: 40.0000 mg | ORAL_TABLET | Freq: Every day | ORAL | Status: DC

## 2013-08-01 ENCOUNTER — Telehealth: Payer: Self-pay | Admitting: Neurology

## 2013-08-01 ENCOUNTER — Telehealth: Payer: Self-pay | Admitting: Cardiology

## 2013-08-01 NOTE — Telephone Encounter (Signed)
Spoke with patient and let him know that from the notes it looks as though Dr Pearlean Brownie is the one who prescribed the Pradaxa for stroke prevention.  I have asked that he call his office for recommendation.  He is in the doughnut whole and I suggest maybe trying to receive samples to help if he can not come off.  I explained to him Dr Marlis Edelson note says he would be on this medication lifelong

## 2013-08-01 NOTE — Telephone Encounter (Signed)
New Prob  Pt states that the pradaxa cost him $175 for 3 mths.  He said now it is going to cost him $410 for three mths. He wants to know if he can take something else.

## 2013-08-04 ENCOUNTER — Telehealth: Payer: Self-pay | Admitting: Nurse Practitioner

## 2013-08-04 NOTE — Telephone Encounter (Signed)
Left message for [name] [name] that we would like for him to stay on Pradaxa if at all possible.  We can give him a voucher for a free 1 month supply and samples, which he may pick up at our front desk.  In addition, we will contact the drug rep to see if he can assist Mr. Pelzer with samples to get him through the "doughnut hole."  Medication is now costing him $410 for a 90 day supply.

## 2013-08-04 NOTE — Telephone Encounter (Signed)
I dispensed Pradaxa 150mg  #60 Lot [id number] Exp 05/2014.  As well I placed a discount voucher for one free month and an enrollment application to try and get patient approved for assistance.

## 2013-08-04 NOTE — Telephone Encounter (Signed)
Called and spoke to patient he states Pradaxa is almost $400.oo his insurance want cover RX. Patient need another RX called In.

## 2013-08-11 ENCOUNTER — Other Ambulatory Visit: Payer: Self-pay

## 2013-08-14 ENCOUNTER — Telehealth: Payer: Self-pay

## 2013-08-14 NOTE — Telephone Encounter (Signed)
The patient dropped off a Patient Assistance Application, however, he did not provide proof of income, which is required by the program.  I called the patient to ask him to either bring in or mail [place] a copy of his proof of income.  Got no answer.  Left message.

## 2013-08-25 ENCOUNTER — Encounter: Payer: Self-pay | Admitting: Internal Medicine

## 2013-08-25 ENCOUNTER — Ambulatory Visit (INDEPENDENT_AMBULATORY_CARE_PROVIDER_SITE_OTHER): Payer: Medicare Other | Admitting: Internal Medicine

## 2013-08-25 ENCOUNTER — Ambulatory Visit: Payer: Medicare Other | Admitting: Cardiology

## 2013-08-25 VITALS — BP 136/82 | HR 69 | Ht 72.5 in | Wt 268.0 lb

## 2013-08-25 DIAGNOSIS — Z95810 Presence of automatic (implantable) cardiac defibrillator: Secondary | ICD-10-CM

## 2013-08-25 DIAGNOSIS — I5022 Chronic systolic (congestive) heart failure: Secondary | ICD-10-CM

## 2013-08-25 DIAGNOSIS — I4891 Unspecified atrial fibrillation: Secondary | ICD-10-CM

## 2013-08-25 DIAGNOSIS — Z7901 Long term (current) use of anticoagulants: Secondary | ICD-10-CM

## 2013-08-25 DIAGNOSIS — I2589 Other forms of chronic ischemic heart disease: Secondary | ICD-10-CM

## 2013-08-25 LAB — ICD DEVICE OBSERVATION
AL AMPLITUDE: 5 mv
AL THRESHOLD: 0.75 V
DEV-0020ICD: NEGATIVE
HV IMPEDENCE: 77 Ohm
MODE SWITCH EPISODES: 46
PACEART VT: 0
RV LEAD THRESHOLD: 0.625 V
TOT-0010: 4
TZON-0010SLOWVT: 40 ms
VF: 0

## 2013-08-25 NOTE — Patient Instructions (Addendum)
Your physician wants you to follow-up in: 12  months with Dr Jacquiline Doe will receive a reminder letter in the mail two months in advance. If you don't receive a letter, please call our office to schedule the follow-up appointment.    Remote monitoring is used to monitor your Pacemaker or ICD from home. This monitoring reduces the number of office visits required to check your device to one time per year. It allows [place] to keep an eye on the functioning of your device to ensure it is working properly. You are scheduled for a device check from home on 11/24/13. You may send your transmission at any time that day. If you have a wireless device, the transmission will be sent automatically. After your physician reviews your transmission, you will receive a postcard with your next transmission date.   Your physician has requested that you have an echocardiogram. Echocardiography is a painless test that uses sound waves to create images of your heart. It provides your doctor with information about the size and shape of your heart and how well your heart's chambers and valves are working. This procedure takes approximately one hour. There are no restrictions for this procedure.---AV optimization

## 2013-08-25 NOTE — Progress Notes (Signed)
PCP: [name], MD Primary Cardiologist:  [name] is a 72 y.o. male who presents today for routine electrophysiology followup.  Since his recent BiV ICD implantion, the patient reports doing very well. He has occasional SOB and fatigue.  His EF did not recover initially following CRT.  Today, he denies symptoms of palpitations, chest pain,  lower extremity edema, dizziness, presyncope, syncope, or ICD shocks.  The patient is otherwise without complaint today.   Past Medical History  Diagnosis Date  . Atrial fibrillation     s/p prior DCCV;  Amiodarone Rx.  . CAD (coronary artery disease)     s/p CABG;  cath 10/07:  LM ok, LAD occluded, AV CFX 95%, pRCA occluded; L-LAD, S-OM2/OM3, S-PDA ok  . Cerebrovascular disease     s/p prior Left CEA;  followed by Dr. [name]  . COPD (chronic obstructive pulmonary disease)   . Hyperlipidemia   . Hypertension   . Mitral insufficiency     hx of. not noted on echo 01/2012  . Nephrolithiasis   . Ischemic cardiomyopathy     echo 5/09: EF 55%, mild LAE;  Myoview 11/10: inf and apical scar with mild peri-infarct ischemia, EF 44%. Echo 01/26/12 with EF 20-25% but previously known to be 30% on echoes before last one  . Iliac artery aneurysm, left     followed by Dr. [name]  . Aortic dissection     H/O focal aortic dissection  . LBBB (left bundle branch block)   . CHF (congestive heart failure)   . Myocardial infarction   . Renal insufficiency     01/2012   Past Surgical History  Procedure Laterality Date  . Uteroscopy    . Coronary artery bypass graft  1998  . Left cea  07/05  . Appendectomy  09/11/11  . Carotid endarterectomy  2005    left  . Biventricular defibrillator implantation  08/22/12    SJM Quadra Assura BiV ICD implanted by Dr [name]    Current Outpatient Prescriptions  Medication Sig Dispense Refill  . albuterol (PROVENTIL HFA;VENTOLIN HFA) 108 (90 BASE) MCG/ACT inhaler Inhale 2 puffs into the lungs every 4  (four) hours as needed for wheezing.  1 Inhaler  0  . amiodarone (PACERONE) 200 MG tablet Take 200 mg by mouth daily.      [name] amLODipine (NORVASC) 10 MG tablet Take 5 mg by mouth daily.      . dabigatran (PRADAXA) 150 MG CAPS Take 150 mg by mouth every 12 (twelve) hours.      . furosemide (LASIX) 40 MG tablet Take 1 tablet (40 mg total) by mouth daily.  90 tablet  4  . hydrALAZINE (APRESOLINE) 50 MG tablet Take 1 tablet (50 mg total) by mouth daily.  90 tablet  3  . lisinopril (PRINIVIL,ZESTRIL) 40 MG tablet Take 1 tablet (40 mg total) by mouth daily.  90 tablet  2  . metoprolol succinate (TOPROL-XL) 25 MG 24 hr tablet Take 12.5 mg by mouth daily.      . Multiple Vitamin (MULITIVITAMIN WITH MINERALS) TABS Take 1 tablet by mouth daily.      . niacin (NIASPAN) 500 MG CR tablet Take 500 mg by mouth 2 (two) times daily.       . nitroGLYCERIN (NITROSTAT) 0.4 MG SL tablet Place 0.4 mg under the tongue every 5 (five) minutes as needed for chest pain.       . potassium chloride SA (KLOR-CON M20) 20 MEQ tablet Take  2 tablets (40 mEq total) by mouth 3 (three) times daily.  540 tablet  3  . pravastatin (PRAVACHOL) 80 MG tablet Take 1 tablet (80 mg total) by mouth daily.  90 tablet  0   No current facility-administered medications for this visit.    Physical Exam: Filed Vitals:   08/25/13 1055  BP: 136/82  Pulse: 69  Height: 6' 0.5" (1.842 m)  Weight: 268 lb (121.564 kg)    GEN- The patient is well appearing, alert and oriented x 3 today.   Head- normocephalic, atraumatic Eyes-  Sclera clear, conjunctiva pink Ears- hearing intact Oropharynx- clear Lungs- Clear to ausculation bilaterally, normal work of breathing Chest- ICD pocket is well healed Heart- Regular rate and rhythm, no murmurs, rubs or gallops, PMI not laterally displaced GI- soft, NT, ND, + BS Extremities- no clubbing, cyanosis, or edema  ICD interrogation- reviewed in detail today,  See PACEART report  Assessment and Plan:  1.   Chronic systolic dysfunction euvolemic today  He is >95% BiV paced Stable on an appropriate medical regimen Normal ICD function See Pace Art report No changes today As he has ongoing fatigue and decreased exercise tolerance and his EF has not normalized, will proceed with echo guided optimization of his CRT device.  2. Afib Maintaining sinus rhythm with amiodarone May be able to decrease amiodarone to 100mg  daily eventually.  Dr [name] to follow LFTs/TFTs Continue long term anticoagulation  3. HTN Stable No change required today  [name] Return to see me in 1year

## 2013-08-28 ENCOUNTER — Ambulatory Visit: Payer: Medicare Other | Admitting: Cardiology

## 2013-08-29 ENCOUNTER — Other Ambulatory Visit: Payer: Self-pay | Admitting: *Deleted

## 2013-08-29 MED ORDER — FUROSEMIDE 40 MG PO TABS: 40.0000 mg | ORAL_TABLET | Freq: Every day | ORAL | Status: DC

## 2013-09-06 ENCOUNTER — Other Ambulatory Visit: Payer: Self-pay | Admitting: Cardiology

## 2013-09-08 ENCOUNTER — Encounter: Payer: Self-pay | Admitting: Internal Medicine

## 2013-09-08 ENCOUNTER — Ambulatory Visit (HOSPITAL_COMMUNITY): Payer: Medicare Other | Attending: Internal Medicine | Admitting: Radiology

## 2013-09-08 ENCOUNTER — Other Ambulatory Visit (HOSPITAL_COMMUNITY): Payer: Medicare Other

## 2013-09-08 ENCOUNTER — Encounter: Payer: Self-pay | Admitting: Physician Assistant

## 2013-09-08 ENCOUNTER — Encounter (HOSPITAL_BASED_OUTPATIENT_CLINIC_OR_DEPARTMENT_OTHER): Payer: Self-pay | Admitting: Physician Assistant

## 2013-09-08 VITALS — BP 169/81 | HR 67 | Ht 72.5 in | Wt 269.8 lb

## 2013-09-08 DIAGNOSIS — I5022 Chronic systolic (congestive) heart failure: Secondary | ICD-10-CM

## 2013-09-08 DIAGNOSIS — E669 Obesity, unspecified: Secondary | ICD-10-CM | POA: Insufficient documentation

## 2013-09-08 DIAGNOSIS — J4489 Other specified chronic obstructive pulmonary disease: Secondary | ICD-10-CM | POA: Insufficient documentation

## 2013-09-08 DIAGNOSIS — I059 Rheumatic mitral valve disease, unspecified: Secondary | ICD-10-CM | POA: Insufficient documentation

## 2013-09-08 DIAGNOSIS — I2589 Other forms of chronic ischemic heart disease: Secondary | ICD-10-CM

## 2013-09-08 DIAGNOSIS — I447 Left bundle-branch block, unspecified: Secondary | ICD-10-CM | POA: Insufficient documentation

## 2013-09-08 DIAGNOSIS — E785 Hyperlipidemia, unspecified: Secondary | ICD-10-CM | POA: Insufficient documentation

## 2013-09-08 DIAGNOSIS — I509 Heart failure, unspecified: Secondary | ICD-10-CM

## 2013-09-08 DIAGNOSIS — J449 Chronic obstructive pulmonary disease, unspecified: Secondary | ICD-10-CM | POA: Insufficient documentation

## 2013-09-08 DIAGNOSIS — R0989 Other specified symptoms and signs involving the circulatory and respiratory systems: Secondary | ICD-10-CM

## 2013-09-08 DIAGNOSIS — I10 Essential (primary) hypertension: Secondary | ICD-10-CM | POA: Insufficient documentation

## 2013-09-08 DIAGNOSIS — I252 Old myocardial infarction: Secondary | ICD-10-CM | POA: Insufficient documentation

## 2013-09-08 DIAGNOSIS — I2510 Atherosclerotic heart disease of native coronary artery without angina pectoris: Secondary | ICD-10-CM | POA: Insufficient documentation

## 2013-09-08 DIAGNOSIS — R0600 Dyspnea, unspecified: Secondary | ICD-10-CM

## 2013-09-08 DIAGNOSIS — I4891 Unspecified atrial fibrillation: Secondary | ICD-10-CM | POA: Insufficient documentation

## 2013-09-08 NOTE — Progress Notes (Signed)
Echocardiogram performed with optimization. Optimization done by Gypsy Balsam RN and Dr. [name]

## 2013-09-08 NOTE — Assessment & Plan Note (Signed)
appt in echo lab today

## 2013-09-08 NOTE — Progress Notes (Signed)
Patient was inadvertently placed on my schedule, but only needs appt. With echo. Will place as erroneous encounter

## 2013-09-10 LAB — ICD DEVICE OBSERVATION
DEV-0020ICD: NEGATIVE
DEVICE MODEL ICD: 1075181
TZON-0003SLOWVT: 340 ms
TZON-0004SLOWVT: 30
TZON-0005SLOWVT: 6
TZON-0010SLOWVT: 40 ms

## 2013-09-15 ENCOUNTER — Other Ambulatory Visit: Payer: Self-pay

## 2013-09-15 ENCOUNTER — Encounter: Payer: Self-pay | Admitting: Internal Medicine

## 2013-09-15 MED ORDER — DABIGATRAN ETEXILATE MESYLATE 150 MG PO CAPS: 150.0000 mg | ORAL_CAPSULE | Freq: Two times a day (BID) | ORAL | Status: DC

## 2013-09-15 NOTE — Telephone Encounter (Signed)
Patient called requesting a Rx for Pradaxa sent to [facility]

## 2013-09-19 ENCOUNTER — Telehealth: Payer: Self-pay | Admitting: Internal Medicine

## 2013-09-19 ENCOUNTER — Ambulatory Visit (INDEPENDENT_AMBULATORY_CARE_PROVIDER_SITE_OTHER): Payer: Medicare Other | Admitting: Cardiology

## 2013-09-19 ENCOUNTER — Encounter: Payer: Self-pay | Admitting: Cardiology

## 2013-09-19 ENCOUNTER — Ambulatory Visit (INDEPENDENT_AMBULATORY_CARE_PROVIDER_SITE_OTHER): Payer: Medicare Other | Admitting: *Deleted

## 2013-09-19 ENCOUNTER — Ambulatory Visit (INDEPENDENT_AMBULATORY_CARE_PROVIDER_SITE_OTHER)
Admission: RE | Admit: 2013-09-19 | Discharge: 2013-09-19 | Disposition: A | Discharge status: Home / Self Care | Payer: Medicare Other | Source: Ambulatory Visit | Attending: Cardiology | Admitting: Cardiology

## 2013-09-19 ENCOUNTER — Encounter: Payer: Self-pay | Admitting: Internal Medicine

## 2013-09-19 VITALS — BP 140/60 | HR 70 | Resp 20 | Wt 270.0 lb

## 2013-09-19 DIAGNOSIS — Z95810 Presence of automatic (implantable) cardiac defibrillator: Secondary | ICD-10-CM

## 2013-09-19 DIAGNOSIS — E7800 Pure hypercholesterolemia, unspecified: Secondary | ICD-10-CM

## 2013-09-19 DIAGNOSIS — I714 Abdominal aortic aneurysm, without rupture: Secondary | ICD-10-CM

## 2013-09-19 DIAGNOSIS — I2510 Atherosclerotic heart disease of native coronary artery without angina pectoris: Secondary | ICD-10-CM

## 2013-09-19 DIAGNOSIS — I2589 Other forms of chronic ischemic heart disease: Secondary | ICD-10-CM

## 2013-09-19 DIAGNOSIS — R05 Cough: Secondary | ICD-10-CM

## 2013-09-19 DIAGNOSIS — R0602 Shortness of breath: Secondary | ICD-10-CM

## 2013-09-19 DIAGNOSIS — I723 Aneurysm of iliac artery: Secondary | ICD-10-CM

## 2013-09-19 DIAGNOSIS — R0609 Other forms of dyspnea: Secondary | ICD-10-CM

## 2013-09-19 DIAGNOSIS — R0600 Dyspnea, unspecified: Secondary | ICD-10-CM

## 2013-09-19 DIAGNOSIS — I4891 Unspecified atrial fibrillation: Secondary | ICD-10-CM

## 2013-09-19 DIAGNOSIS — I10 Essential (primary) hypertension: Secondary | ICD-10-CM

## 2013-09-19 LAB — CBC WITH DIFFERENTIAL/PLATELET
Basophils Absolute: 0 10*3/uL (ref 0.0–0.1)
Eosinophils Absolute: 0.7 10*3/uL (ref 0.0–0.7)
HCT: 45.4 % (ref 39.0–52.0)
MCHC: 33.5 g/dL (ref 30.0–36.0)
MCV: 94.2 fl (ref 78.0–100.0)
Monocytes Absolute: 0.7 10*3/uL (ref 0.1–1.0)
Neutro Abs: 3.9 10*3/uL (ref 1.4–7.7)
Neutrophils Relative %: 50.2 % (ref 43.0–77.0)
Platelets: 180 10*3/uL (ref 150.0–400.0)
WBC: 7.8 10*3/uL (ref 4.5–10.5)

## 2013-09-19 LAB — TSH: TSH: 0.5 u[IU]/mL (ref 0.35–5.50)

## 2013-09-19 LAB — ICD DEVICE OBSERVATION
AL AMPLITUDE: 5 mv
AL IMPEDENCE ICD: 450 Ohm
AL THRESHOLD: 0.625 V
DEV-0020ICD: NEGATIVE
RV LEAD IMPEDENCE ICD: 487.5 Ohm
RV LEAD THRESHOLD: 0.75 V
TOT-0007: 1
TOT-0008: 0
TZON-0003SLOWVT: 340 ms
TZON-0010SLOWVT: 40 ms
VENTRICULAR PACING ICD: 95 pct
VF: 0

## 2013-09-19 LAB — HEPATIC FUNCTION PANEL
ALT: 24 U/L (ref 0–53)
AST: 23 U/L (ref 0–37)
Alkaline Phosphatase: 78 U/L (ref 39–117)
Bilirubin, Direct: 0.2 mg/dL (ref 0.0–0.3)
Total Bilirubin: 1.2 mg/dL (ref 0.3–1.2)

## 2013-09-19 LAB — BASIC METABOLIC PANEL
CO2: 29 mEq/L (ref 19–32)
Calcium: 8.9 mg/dL (ref 8.4–10.5)
Chloride: 107 mEq/L (ref 96–112)
Glucose, Bld: 100 mg/dL — ABNORMAL HIGH (ref 70–99)
Sodium: 145 mEq/L (ref 135–145)

## 2013-09-19 MED ORDER — AZITHROMYCIN 250 MG PO TABS: ORAL_TABLET | ORAL | Status: DC

## 2013-09-19 NOTE — Progress Notes (Signed)
Pt c/o of constricted breathing. Woke up this morning at 1:30, took breathing treatment. Still presents w/ very noticeable (audible) breathing difficulty.  Quick-checked his ICD/CRT-D for fluid monitoring.  No abnormalities present; pt continues to take furosemide as instructed. Pt's spouse notices edema when checking his ankle--states thumb print will remain when depressed.   PVARP was too long ( ). Showed Atrial channel markers but was not responding to intrinsic P-waves---shortened PVARP to .   [name] 11/24/13 & ROV w/ Dr. [name] 08/2014

## 2013-09-19 NOTE — Assessment & Plan Note (Signed)
Continue ACE inhibitor and beta blocker. 

## 2013-09-19 NOTE — Assessment & Plan Note (Signed)
Continue statin. 

## 2013-09-19 NOTE — Assessment & Plan Note (Signed)
Followed by vascular surgery. 

## 2013-09-19 NOTE — Progress Notes (Signed)
HPI: Pleasant male for FU of CAD. He has a history of coronary artery disease, status post coronary artery bypassing graft, history of ischemic cardiomyopathy as well as COPD. His last catheterization in August of 2013 showed normal left main, occluded LAD, a small OM 2 with an ostial 95% lesion, 2 moderate size obtuse marginals were occluded. The right coronary was occluded. The LIMA to the LAD was patent. The saphenous vein graft to the diagonal had a 50% proximal stenosis. The saphenous vein graft to the obtuse marginal was patent. Saphenous vein graft to the right coronary artery had a mid 40% lesion. Ejection fraction was 35%. Patient subsequently had a biventricular ICD implanted in September of 2013. He also has a history of focal aortic dissection being managed medically. Note, he also has a left iliac artery aneurysm and mild cerebrovascular disease with history of carotid endarterectomy. He is followed by vascular surgery (Dr. [name]) for these issues. CTA Feb 2013 showed no aortic dissection or pulmonary embolus. Last echocardiogram in September 2014 showed an ejection fraction of 30-35%. The aortic root was mildly dilated. There was biatrial enlargement and mild mitral regurgitation. Patient complains of increased dyspnea for 5 days. It should be noted that he had AV optimization 2 weeks ago. He is dyspneic with exertion and there is orthopnea and mild pedal edema. He denies chest pain. No fevers or chills but he has had a significant nonproductive cough. Presented to have device checked today and added to my schedule.   Current Outpatient Prescriptions  Medication Sig Dispense Refill  . albuterol (PROVENTIL HFA;VENTOLIN HFA) 108 (90 BASE) MCG/ACT inhaler Inhale 2 puffs into the lungs every 4 (four) hours as needed for wheezing.  1 Inhaler  0  . amiodarone (PACERONE) 200 MG tablet Take 200 mg by mouth daily.      [name] amLODipine (NORVASC) 10 MG tablet TAKE 1/2 TABLET EVERY DAY  45 tablet  3    . dabigatran (PRADAXA) 150 MG CAPS capsule Take 1 capsule (150 mg total) by mouth every 12 (twelve) hours.  60 capsule  3  . furosemide (LASIX) 40 MG tablet Take 1 tablet (40 mg total) by mouth daily.  30 tablet  0  . hydrALAZINE (APRESOLINE) 50 MG tablet Take 1 tablet (50 mg total) by mouth daily.  90 tablet  3  . lisinopril (PRINIVIL,ZESTRIL) 40 MG tablet Take 1 tablet (40 mg total) by mouth daily.  90 tablet  2  . metoprolol succinate (TOPROL-XL) 25 MG 24 hr tablet Take 12.5 mg by mouth daily.      . Multiple Vitamin (MULITIVITAMIN WITH MINERALS) TABS Take 1 tablet by mouth daily.      . niacin (NIASPAN) 500 MG CR tablet Take 1,000 mg by mouth 2 (two) times daily.       . nitroGLYCERIN (NITROSTAT) 0.4 MG SL tablet Place 0.4 mg under the tongue every 5 (five) minutes as needed for chest pain.       . potassium chloride SA (KLOR-CON M20) 20 MEQ tablet Take 2 tablets (40 mEq total) by mouth 3 (three) times daily.  540 tablet  3  . pravastatin (PRAVACHOL) 80 MG tablet Take 1 tablet (80 mg total) by mouth daily.  90 tablet  0   No current facility-administered medications for this visit.     Past Medical History  Diagnosis Date  . Atrial fibrillation     s/p prior DCCV;  Amiodarone Rx.  . CAD (coronary artery disease)  s/p CABG;  cath 10/07:  LM ok, LAD occluded, AV CFX 95%, pRCA occluded; L-LAD, S-OM2/OM3, S-PDA ok  . Cerebrovascular disease     s/p prior Left CEA;  followed by Dr. [name]  . COPD (chronic obstructive pulmonary disease)   . Hyperlipidemia   . Hypertension   . Mitral insufficiency     hx of. not noted on echo 01/2012  . Nephrolithiasis   . Ischemic cardiomyopathy     echo 5/09: EF 55%, mild LAE;  Myoview 11/10: inf and apical scar with mild peri-infarct ischemia, EF 44%. Echo 01/26/12 with EF 20-25% but previously known to be 30% on echoes before last one  . Iliac artery aneurysm, left     followed by Dr. [name]  . Aortic dissection     H/O focal aortic dissection  .  LBBB (left bundle branch block)   . CHF (congestive heart failure)   . Myocardial infarction   . Renal insufficiency     01/2012    Past Surgical History  Procedure Laterality Date  . Uteroscopy    . Coronary artery bypass graft  1998  . Left cea  07/05  . Appendectomy  09/11/11  . Carotid endarterectomy  2005    left  . Biventricular defibrillator implantation  08/22/12    SJM Quadra Assura BiV ICD implanted by Dr [name]    History   Social History  . Marital Status: Married    Spouse Name: N/A    Number of Children: 3  . Years of Education: N/A   Occupational History  . RETIRED---truck driver    Social History Main Topics  . Smoking status: Former Smoker -- 1.50 packs/day for 57 years    Types: Cigarettes    Quit date: 12/11/2004  . Smokeless tobacco: Never Used  . Alcohol Use: No     Comment: 6 yrs ago   . Drug Use: No  . Sexual Activity: No   Other Topics Concern  . Not on file   Social History Narrative   Lives in [place], [place] with wife.    Caffeine Use: Does drink coffee    ROS: productive cough but no fevers or chills, hemoptysis, dysphasia, odynophagia, melena, hematochezia, dysuria, hematuria, rash, seizure activity, claudication. Remaining systems are negative.  Physical Exam: Well-developed obese in no acute distress.  Skin is warm and dry.  HEENT is normal.  Neck is supple.  Chest is clear to auscultation with normal expansion.  Cardiovascular exam is regular rate and rhythm.  Abdominal exam nontender or distended. No masses palpated. Extremities show trace edema. neuro grossly intact  ECG sinus rhythm with ventricular pacing.

## 2013-09-19 NOTE — Telephone Encounter (Signed)
Spoke with patient who states he had an adjustment made to his ICD approximately 1 week ago and that since that time he has been very SOB.  Patient states he is SOB all of the time, that it is not dependent upon activity level.  Patient denies chest discomfort or edema.  I spoke with Dennis Bast, RN who is Dr. Jenel Lucks primary nurse who advised patient that he needs to come to device clinic today.  I spoke with Weston Anna in device clinic who states patient can come to [facility]. Office as soon as he is able.  I advised patient to get ready and come to the office and that he needs someone to drive him.  Patient verbalized understanding and agreement.

## 2013-09-19 NOTE — Assessment & Plan Note (Signed)
Continue statin. Followed by vascular surgery. 

## 2013-09-19 NOTE — Patient Instructions (Signed)
INCREASE FUROSEMIDE TO 40 MG TWICE DAILY X TWO DAYS THEN DECREASE BACK TO 40 MG ONCE DAILY AND TAKE AN EXTRA 40 MG DAILY AS NEEDED FOR SHORTNESS OF BREATH OR SWELLING  A chest x-ray takes a picture of the organs and structures inside the chest, including the heart, lungs, and blood vessels. This test can show several things, including, whether the heart is enlarges; whether fluid is building up in the lungs; and whether pacemaker / defibrillator leads are still in place. AT [address]  Your physician recommends that you HAVE LAB WORK TODAY

## 2013-09-19 NOTE — Assessment & Plan Note (Signed)
Continue present blood pressure medications. 

## 2013-09-19 NOTE — Assessment & Plan Note (Signed)
Managed by electrophysiology. He may need to have reassessment of his AV optimization as temporarily his symptoms correlate.

## 2013-09-19 NOTE — Assessment & Plan Note (Addendum)
Patient is in sinus rhythm today. Continue beta blocker and pradaxa. Check hemoglobin and renal function. Given amiodarone use check chest x-ray, liver functions and TSH.

## 2013-09-19 NOTE — Assessment & Plan Note (Signed)
Patient complains of increased dyspnea for 5 days. He has orthopnea and mild pedal edema. His symptoms sound to be volume excess. Increase Lasix to 40 mg by mouth twice a day for 2 days. He will then resume 40 mg daily with an additional 40 mg as needed. Check potassium, renal function and BNP. Note his optival is normal. Not clear that he has an infectious component. However he has a significant cough during the evaluation. Continue bronchodilators. I will also give a Z-Pak today.

## 2013-09-19 NOTE — Telephone Encounter (Signed)
New message   Had difib checked 1wk ago---every since then--he cannot breath

## 2013-09-22 NOTE — Telephone Encounter (Signed)
Brought pt into clinic for OptiVol fluid check, documented on inpatient visit chart.

## 2013-09-25 ENCOUNTER — Encounter: Payer: Self-pay | Admitting: Cardiology

## 2013-09-25 ENCOUNTER — Ambulatory Visit (INDEPENDENT_AMBULATORY_CARE_PROVIDER_SITE_OTHER): Payer: Medicare Other | Admitting: Cardiology

## 2013-09-25 VITALS — BP 140/86 | HR 72 | Ht 72.0 in | Wt 275.8 lb

## 2013-09-25 DIAGNOSIS — E7800 Pure hypercholesterolemia, unspecified: Secondary | ICD-10-CM

## 2013-09-25 DIAGNOSIS — I714 Abdominal aortic aneurysm, without rupture: Secondary | ICD-10-CM

## 2013-09-25 DIAGNOSIS — I5022 Chronic systolic (congestive) heart failure: Secondary | ICD-10-CM

## 2013-09-25 DIAGNOSIS — I4891 Unspecified atrial fibrillation: Secondary | ICD-10-CM

## 2013-09-25 DIAGNOSIS — I10 Essential (primary) hypertension: Secondary | ICD-10-CM

## 2013-09-25 DIAGNOSIS — I2510 Atherosclerotic heart disease of native coronary artery without angina pectoris: Secondary | ICD-10-CM

## 2013-09-25 DIAGNOSIS — Z95810 Presence of automatic (implantable) cardiac defibrillator: Secondary | ICD-10-CM

## 2013-09-25 DIAGNOSIS — I723 Aneurysm of iliac artery: Secondary | ICD-10-CM

## 2013-09-25 DIAGNOSIS — I2589 Other forms of chronic ischemic heart disease: Secondary | ICD-10-CM

## 2013-09-25 LAB — BASIC METABOLIC PANEL
Calcium: 9 mg/dL (ref 8.4–10.5)
Chloride: 108 mEq/L (ref 96–112)
GFR: 61.08 mL/min (ref >60.00)
Potassium: 3.7 mEq/L (ref 3.5–5.1)
Sodium: 144 mEq/L (ref 135–145)

## 2013-09-25 NOTE — Assessment & Plan Note (Addendum)
Followed by vascular surgery. Continue statin. 

## 2013-09-25 NOTE — Assessment & Plan Note (Signed)
Followed by vascular surgery. 

## 2013-09-25 NOTE — Assessment & Plan Note (Signed)
Continue beta blocker and ACE inhibitor. 

## 2013-09-25 NOTE — Assessment & Plan Note (Signed)
Continued amiodarone and pradaxa.

## 2013-09-25 NOTE — Patient Instructions (Signed)
Your physician recommends that you schedule a follow-up appointment in: AS SCHEDULED  Your physician recommends that you HAVE LAB WORK TODAY

## 2013-09-25 NOTE — Assessment & Plan Note (Signed)
Management per electrophysiology. 

## 2013-09-25 NOTE — Assessment & Plan Note (Signed)
Continue statin. 

## 2013-09-25 NOTE — Assessment & Plan Note (Signed)
Continue present blood pressure medications. 

## 2013-09-25 NOTE — Progress Notes (Signed)
HPI: Pleasant male for FU of CAD and dyspnea. He has a history of coronary artery disease, status post coronary artery bypassing graft, history of ischemic cardiomyopathy as well as COPD. His last catheterization in August of 2013 showed normal left main, occluded LAD, a small OM 2 with an ostial 95% lesion, 2 moderate size obtuse marginals were occluded. The right coronary was occluded. The LIMA to the LAD was patent. The saphenous vein graft to the diagonal had a 50% proximal stenosis. The saphenous vein graft to the obtuse marginal was patent. Saphenous vein graft to the right coronary artery had a mid 40% lesion. Ejection fraction was 35%. Patient subsequently had a biventricular ICD implanted in September of 2013. He also has a history of focal aortic dissection being managed medically. Note, he also has a left iliac artery aneurysm and mild cerebrovascular disease with history of carotid endarterectomy. He is followed by vascular surgery (Dr. [name]) for these issues. Last echocardiogram in September 2014 showed an ejection fraction of 30-35%. The aortic root was mildly dilated. There was biatrial enlargement and mild mitral regurgitation. I saw the patient last week as an add on for increasing dyspnea. He had AV optimization 2 weeks prior. Chest x-ray showed COPD but no edema. Pro BNP was 138. Hemoglobin was normal. We increased his Lasix for 2 days and gave him azithromycin. Since that time, his dyspnea has improved somewhat. His cough is now productive of yellow sputum. There is no orthopnea or pedal edema. No chest pain.   Current Outpatient Prescriptions  Medication Sig Dispense Refill  . albuterol (PROVENTIL HFA;VENTOLIN HFA) 108 (90 BASE) MCG/ACT inhaler Inhale 2 puffs into the lungs every 4 (four) hours as needed for wheezing.  1 Inhaler  0  . amiodarone (PACERONE) 200 MG tablet Take 200 mg by mouth daily.      [name] amLODipine (NORVASC) 10 MG tablet TAKE 1/2 TABLET EVERY DAY  45 tablet  3    . amoxicillin-clavulanate (AUGMENTIN) 875-125 MG per tablet as directed.      [name] azithromycin (ZITHROMAX Z-PAK) 250 MG tablet Take as directed  6 each  0  . dabigatran (PRADAXA) 150 MG CAPS capsule Take 1 capsule (150 mg total) by mouth every 12 (twelve) hours.  60 capsule  3  . furosemide (LASIX) 40 MG tablet Take 1 tablet (40 mg total) by mouth daily.  30 tablet  0  . hydrALAZINE (APRESOLINE) 50 MG tablet Take 1 tablet (50 mg total) by mouth daily.  90 tablet  3  . lisinopril (PRINIVIL,ZESTRIL) 40 MG tablet Take 1 tablet (40 mg total) by mouth daily.  90 tablet  2  . loratadine (CLARITIN) 10 MG tablet Take 10 mg by mouth daily.      . metoprolol succinate (TOPROL-XL) 25 MG 24 hr tablet Take 12.5 mg by mouth daily.      . Multiple Vitamin (MULITIVITAMIN WITH MINERALS) TABS Take 1 tablet by mouth daily.      . niacin (NIASPAN) 500 MG CR tablet Take 1,000 mg by mouth 2 (two) times daily.       . nitroGLYCERIN (NITROSTAT) 0.4 MG SL tablet Place 0.4 mg under the tongue every 5 (five) minutes as needed for chest pain.       . potassium chloride SA (KLOR-CON M20) 20 MEQ tablet Take 2 tablets (40 mEq total) by mouth 3 (three) times daily.  540 tablet  3  . pravastatin (PRAVACHOL) 80 MG tablet Take 1 tablet (80  mg total) by mouth daily.  90 tablet  0  . predniSONE (DELTASONE) 20 MG tablet as directed.       No current facility-administered medications for this visit.     Past Medical History  Diagnosis Date  . Atrial fibrillation     s/p prior DCCV;  Amiodarone Rx.  . CAD (coronary artery disease)     s/p CABG;  cath 10/07:  LM ok, LAD occluded, AV CFX 95%, pRCA occluded; L-LAD, S-OM2/OM3, S-PDA ok  . Cerebrovascular disease     s/p prior Left CEA;  followed by Dr. [name]  . COPD (chronic obstructive pulmonary disease)   . Hyperlipidemia   . Hypertension   . Mitral insufficiency     hx of. not noted on echo 01/2012  . Nephrolithiasis   . Ischemic cardiomyopathy     echo 5/09: EF 55%, mild  LAE;  Myoview 11/10: inf and apical scar with mild peri-infarct ischemia, EF 44%. Echo 01/26/12 with EF 20-25% but previously known to be 30% on echoes before last one  . Iliac artery aneurysm, left     followed by Dr. [name]  . Aortic dissection     H/O focal aortic dissection  . LBBB (left bundle branch block)   . CHF (congestive heart failure)   . Myocardial infarction   . Renal insufficiency     01/2012    Past Surgical History  Procedure Laterality Date  . Uteroscopy    . Coronary artery bypass graft  1998  . Left cea  07/05  . Appendectomy  09/11/11  . Carotid endarterectomy  2005    left  . Biventricular defibrillator implantation  08/22/12    SJM Quadra Assura BiV ICD implanted by Dr [name]    History   Social History  . Marital Status: Married    Spouse Name: N/A    Number of Children: 3  . Years of Education: N/A   Occupational History  . RETIRED---truck driver    Social History Main Topics  . Smoking status: Former Smoker -- 1.50 packs/day for 57 years    Types: Cigarettes    Quit date: 12/11/2004  . Smokeless tobacco: Never Used  . Alcohol Use: No     Comment: 6 yrs ago   . Drug Use: No  . Sexual Activity: No   Other Topics Concern  . Not on file   Social History Narrative   Lives in [place], [place] with wife.    Caffeine Use: Does drink coffee    ROS: no fevers or chills, productive cough, hemoptysis, dysphasia, odynophagia, melena, hematochezia, dysuria, hematuria, rash, seizure activity, orthopnea, PND, pedal edema, claudication. Remaining systems are negative.  Physical Exam: Well-developed obese in no acute distress.  Skin is warm and dry.  HEENT is normal.  Neck is supple.  Chest is clear to auscultation with normal expansion.  Cardiovascular exam is regular rate and rhythm.  Abdominal exam nontender or distended. No masses palpated. Extremities show no edema. neuro grossly intact

## 2013-09-25 NOTE — Assessment & Plan Note (Signed)
Patient states his weight has increased. However he is also taking prednisone.his symptoms are improving. Continue Lasix twice a day. Check potassium and renal function.

## 2013-09-29 ENCOUNTER — Other Ambulatory Visit: Payer: Self-pay | Admitting: Cardiology

## 2013-10-29 ENCOUNTER — Other Ambulatory Visit: Payer: Self-pay | Admitting: Cardiology

## 2013-10-31 ENCOUNTER — Ambulatory Visit (INDEPENDENT_AMBULATORY_CARE_PROVIDER_SITE_OTHER): Payer: Medicare Other | Admitting: Cardiology

## 2013-10-31 ENCOUNTER — Encounter: Payer: Self-pay | Admitting: Cardiology

## 2013-10-31 VITALS — BP 154/74 | HR 88 | Ht 73.0 in | Wt 269.0 lb

## 2013-10-31 DIAGNOSIS — I2510 Atherosclerotic heart disease of native coronary artery without angina pectoris: Secondary | ICD-10-CM

## 2013-10-31 DIAGNOSIS — I4891 Unspecified atrial fibrillation: Secondary | ICD-10-CM

## 2013-10-31 DIAGNOSIS — I714 Abdominal aortic aneurysm, without rupture, unspecified: Secondary | ICD-10-CM

## 2013-10-31 DIAGNOSIS — E7800 Pure hypercholesterolemia, unspecified: Secondary | ICD-10-CM

## 2013-10-31 DIAGNOSIS — I723 Aneurysm of iliac artery: Secondary | ICD-10-CM

## 2013-10-31 DIAGNOSIS — I10 Essential (primary) hypertension: Secondary | ICD-10-CM

## 2013-10-31 DIAGNOSIS — I2589 Other forms of chronic ischemic heart disease: Secondary | ICD-10-CM

## 2013-10-31 DIAGNOSIS — Z95810 Presence of automatic (implantable) cardiac defibrillator: Secondary | ICD-10-CM

## 2013-10-31 DIAGNOSIS — I5022 Chronic systolic (congestive) heart failure: Secondary | ICD-10-CM

## 2013-10-31 NOTE — Assessment & Plan Note (Signed)
euvolemic on examination.continue present dose of Lasix. 

## 2013-10-31 NOTE — Progress Notes (Signed)
HPI: Pleasant male for FU of CAD. He has a history of coronary artery disease, status post coronary artery bypassing graft, history of ischemic cardiomyopathy, PAF as well as COPD. His last catheterization in August of 2013 showed normal left main, occluded LAD, a small OM 2 with an ostial 95% lesion, 2 moderate size obtuse marginals were occluded. The right coronary was occluded. The LIMA to the LAD was patent. The saphenous vein graft to the diagonal had a 50% proximal stenosis. The saphenous vein graft to the obtuse marginal was patent. Saphenous vein graft to the right coronary artery had a mid 40% lesion. Ejection fraction was 35%. Patient subsequently had a biventricular ICD implanted in September of 2013. He also has a history of focal aortic dissection being managed medically. Note, he also has a left iliac artery aneurysm and mild cerebrovascular disease with history of carotid endarterectomy. He is followed by vascular surgery (Dr. [name]) for these issues. Last echocardiogram in September 2014 showed an ejection fraction of 30-35%. The aortic root was mildly dilated. There was biatrial enlargement and mild mitral regurgitation. Patient recently seen for dyspnea and treated with increase dose of Lasix transiently as well as therapy for her COPD. Since I last saw him the patient has dyspnea with more extreme activities but not with routine activities. It is relieved with rest. It is not associated with chest pain. There is no orthopnea, PND or pedal edema. There is no syncope or palpitations. There is no exertional chest pain.    Current Outpatient Prescriptions  Medication Sig Dispense Refill  . albuterol (PROVENTIL HFA;VENTOLIN HFA) 108 (90 BASE) MCG/ACT inhaler Inhale 2 puffs into the lungs every 4 (four) hours as needed for wheezing.  1 Inhaler  0  . amiodarone (PACERONE) 200 MG tablet TAKE 1 TABLET DAILY.  90 tablet  2  . amLODipine (NORVASC) 10 MG tablet TAKE 1/2 TABLET EVERY DAY  45  tablet  3  . amoxicillin-clavulanate (AUGMENTIN) 875-125 MG per tablet as directed.      [name] azithromycin (ZITHROMAX Z-PAK) 250 MG tablet Take as directed  6 each  0  . dabigatran (PRADAXA) 150 MG CAPS capsule Take 1 capsule (150 mg total) by mouth every 12 (twelve) hours.  60 capsule  3  . furosemide (LASIX) 40 MG tablet Take 1 tablet (40 mg total) by mouth daily.  30 tablet  0  . hydrALAZINE (APRESOLINE) 50 MG tablet Take 1 tablet (50 mg total) by mouth daily.  90 tablet  3  . lisinopril (PRINIVIL,ZESTRIL) 40 MG tablet Take 1 tablet (40 mg total) by mouth daily.  90 tablet  2  . loratadine (CLARITIN) 10 MG tablet Take 10 mg by mouth daily.      . metoprolol succinate (TOPROL-XL) 25 MG 24 hr tablet Take 12.5 mg by mouth daily.      . Multiple Vitamin (MULITIVITAMIN WITH MINERALS) TABS Take 1 tablet by mouth daily.      . niacin (NIASPAN) 500 MG CR tablet Take 1,000 mg by mouth 2 (two) times daily.       . nitroGLYCERIN (NITROSTAT) 0.4 MG SL tablet Place 0.4 mg under the tongue every 5 (five) minutes as needed for chest pain.       . potassium chloride SA (KLOR-CON M20) 20 MEQ tablet Take 2 tablets (40 mEq total) by mouth 3 (three) times daily.  540 tablet  3  . pravastatin (PRAVACHOL) 80 MG tablet TAKE 1 TABLET EVERY DAY  90  tablet  1  . predniSONE (DELTASONE) 20 MG tablet as directed.       No current facility-administered medications for this visit.     Past Medical History  Diagnosis Date  . Atrial fibrillation     s/p prior DCCV;  Amiodarone Rx.  . CAD (coronary artery disease)     s/p CABG;  cath 10/07:  LM ok, LAD occluded, AV CFX 95%, pRCA occluded; L-LAD, S-OM2/OM3, S-PDA ok  . Cerebrovascular disease     s/p prior Left CEA;  followed by Dr. [name]  . COPD (chronic obstructive pulmonary disease)   . Hyperlipidemia   . Hypertension   . Mitral insufficiency     hx of. not noted on echo 01/2012  . Nephrolithiasis   . Ischemic cardiomyopathy     echo 5/09: EF 55%, mild LAE;   Myoview 11/10: inf and apical scar with mild peri-infarct ischemia, EF 44%. Echo 01/26/12 with EF 20-25% but previously known to be 30% on echoes before last one  . Iliac artery aneurysm, left     followed by Dr. [name]  . Aortic dissection     H/O focal aortic dissection  . LBBB (left bundle branch block)   . CHF (congestive heart failure)   . Myocardial infarction   . Renal insufficiency     01/2012    Past Surgical History  Procedure Laterality Date  . Uteroscopy    . Coronary artery bypass graft  1998  . Left cea  07/05  . Appendectomy  09/11/11  . Carotid endarterectomy  2005    left  . Biventricular defibrillator implantation  08/22/12    SJM Quadra Assura BiV ICD implanted by Dr [name]    History   Social History  . Marital Status: Married    Spouse Name: N/A    Number of Children: 3  . Years of Education: N/A   Occupational History  . RETIRED---truck driver    Social History Main Topics  . Smoking status: Former Smoker -- 1.50 packs/day for 57 years    Types: Cigarettes    Quit date: 12/11/2004  . Smokeless tobacco: Never Used  . Alcohol Use: No     Comment: 6 yrs ago   . Drug Use: No  . Sexual Activity: No   Other Topics Concern  . Not on file   Social History Narrative   Lives in [place], [place] with wife.    Caffeine Use: Does drink coffee    ROS: no fevers or chills, productive cough, hemoptysis, dysphasia, odynophagia, melena, hematochezia, dysuria, hematuria, rash, seizure activity, orthopnea, PND, pedal edema, claudication. Remaining systems are negative.  Physical Exam: Well-developed obese in no acute distress.  Skin is warm and dry.  HEENT is normal.  Neck is supple.  Chest with diminished BS diffusely Cardiovascular exam is regular rate and rhythm.  Abdominal exam nontender or distended. No masses palpated. Extremities show no edema. neuro grossly intact

## 2013-10-31 NOTE — Assessment & Plan Note (Signed)
Management per electrophysiology. 

## 2013-10-31 NOTE — Assessment & Plan Note (Signed)
Followed by vascular surgery. 

## 2013-10-31 NOTE — Assessment & Plan Note (Signed)
Patient remains in sinus rhythm. Continue amiodarone. Continue pradaxa.

## 2013-10-31 NOTE — Assessment & Plan Note (Signed)
Continue statin.followed by vascular surgery. 

## 2013-10-31 NOTE — Assessment & Plan Note (Signed)
Blood pressure mildly elevated. I have asked him to follow this and we will adjust medicines as needed.

## 2013-10-31 NOTE — Assessment & Plan Note (Signed)
Continue ACE inhibitor and beta blocker. 

## 2013-10-31 NOTE — Assessment & Plan Note (Signed)
Continue statin. 

## 2013-10-31 NOTE — Patient Instructions (Signed)
Your physician wants you to follow-up in: 6 MONTHS WITH DR CRENSHAW You will receive a reminder letter in the mail two months in advance. If you don't receive a letter, please call our office to schedule the follow-up appointment.  

## 2013-11-24 ENCOUNTER — Ambulatory Visit (INDEPENDENT_AMBULATORY_CARE_PROVIDER_SITE_OTHER): Payer: Medicare Other | Admitting: *Deleted

## 2013-11-24 ENCOUNTER — Encounter: Payer: Self-pay | Admitting: Internal Medicine

## 2013-11-24 DIAGNOSIS — I4891 Unspecified atrial fibrillation: Secondary | ICD-10-CM

## 2013-11-24 DIAGNOSIS — I5022 Chronic systolic (congestive) heart failure: Secondary | ICD-10-CM

## 2013-11-30 LAB — MDC_IDC_ENUM_SESS_TYPE_REMOTE
Battery Remaining Longevity: 66 mo
Brady Statistic RA Percent Paced: 82 %
Brady Statistic RV Percent Paced: 98 %
HighPow Impedance: 82 Ohm
Implantable Pulse Generator Model: 3265
Implantable Pulse Generator Serial Number: 1075181
Lead Channel Impedance Value: 460 Ohm
Lead Channel Pacing Threshold Amplitude: 0.75 V
Lead Channel Pacing Threshold Amplitude: 0.875 V
Lead Channel Pacing Threshold Pulse Width: 0.5 ms
Lead Channel Pacing Threshold Pulse Width: 0.5 ms
Lead Channel Pacing Threshold Pulse Width: 0.5 ms
Lead Channel Sensing Intrinsic Amplitude: 12 mV
Lead Channel Setting Pacing Amplitude: 1.625
Lead Channel Setting Pacing Amplitude: 2.125
Zone Setting Detection Interval: 280 ms
Zone Setting Detection Interval: 340 ms

## 2013-12-05 ENCOUNTER — Other Ambulatory Visit: Payer: Self-pay | Admitting: Cardiology

## 2013-12-18 ENCOUNTER — Encounter: Payer: Self-pay | Admitting: *Deleted

## 2014-01-05 ENCOUNTER — Emergency Department (HOSPITAL_COMMUNITY)
Admission: EM | Admit: 2014-01-05 | Discharge: 2014-01-06 | Disposition: A | Discharge status: Home / Self Care | Payer: Medicare Other | Attending: Emergency Medicine | Admitting: Emergency Medicine

## 2014-01-05 ENCOUNTER — Emergency Department (HOSPITAL_COMMUNITY): Payer: Medicare Other

## 2014-01-05 DIAGNOSIS — Z951 Presence of aortocoronary bypass graft: Secondary | ICD-10-CM | POA: Insufficient documentation

## 2014-01-05 DIAGNOSIS — I10 Essential (primary) hypertension: Secondary | ICD-10-CM | POA: Insufficient documentation

## 2014-01-05 DIAGNOSIS — J441 Chronic obstructive pulmonary disease with (acute) exacerbation: Secondary | ICD-10-CM | POA: Insufficient documentation

## 2014-01-05 DIAGNOSIS — Z87442 Personal history of urinary calculi: Secondary | ICD-10-CM | POA: Insufficient documentation

## 2014-01-05 DIAGNOSIS — Z8673 Personal history of transient ischemic attack (TIA), and cerebral infarction without residual deficits: Secondary | ICD-10-CM | POA: Insufficient documentation

## 2014-01-05 DIAGNOSIS — J4489 Other specified chronic obstructive pulmonary disease: Secondary | ICD-10-CM | POA: Insufficient documentation

## 2014-01-05 DIAGNOSIS — J159 Unspecified bacterial pneumonia: Secondary | ICD-10-CM | POA: Insufficient documentation

## 2014-01-05 DIAGNOSIS — I4460 Unspecified fascicular block: Secondary | ICD-10-CM | POA: Insufficient documentation

## 2014-01-05 DIAGNOSIS — J189 Pneumonia, unspecified organism: Secondary | ICD-10-CM

## 2014-01-05 DIAGNOSIS — Z9889 Other specified postprocedural states: Secondary | ICD-10-CM | POA: Insufficient documentation

## 2014-01-05 DIAGNOSIS — I2510 Atherosclerotic heart disease of native coronary artery without angina pectoris: Secondary | ICD-10-CM | POA: Insufficient documentation

## 2014-01-05 DIAGNOSIS — I509 Heart failure, unspecified: Secondary | ICD-10-CM | POA: Insufficient documentation

## 2014-01-05 DIAGNOSIS — Z87891 Personal history of nicotine dependence: Secondary | ICD-10-CM | POA: Insufficient documentation

## 2014-01-05 DIAGNOSIS — I4891 Unspecified atrial fibrillation: Secondary | ICD-10-CM | POA: Insufficient documentation

## 2014-01-05 DIAGNOSIS — Z79899 Other long term (current) drug therapy: Secondary | ICD-10-CM | POA: Insufficient documentation

## 2014-01-05 DIAGNOSIS — J449 Chronic obstructive pulmonary disease, unspecified: Secondary | ICD-10-CM

## 2014-01-05 DIAGNOSIS — E785 Hyperlipidemia, unspecified: Secondary | ICD-10-CM | POA: Insufficient documentation

## 2014-01-05 DIAGNOSIS — I252 Old myocardial infarction: Secondary | ICD-10-CM | POA: Insufficient documentation

## 2014-01-05 LAB — CBC WITH DIFFERENTIAL/PLATELET
BASOS PCT: 1 % (ref 0–1)
Basophils Absolute: 0.1 10*3/uL (ref 0.0–0.1)
Eosinophils Absolute: 0.4 10*3/uL (ref 0.0–0.7)
Eosinophils Relative: 4 % (ref 0–5)
HCT: 45.5 % (ref 39.0–52.0)
HEMOGLOBIN: 15.3 g/dL (ref 13.0–17.0)
Lymphocytes Relative: 29 % (ref 12–46)
Lymphs Abs: 2.4 10*3/uL (ref 0.7–4.0)
MCH: 32.3 pg (ref 26.0–34.0)
MCHC: 33.6 g/dL (ref 30.0–36.0)
MCV: 96 fL (ref 78.0–100.0)
MONOS PCT: 5 % (ref 3–12)
Monocytes Absolute: 0.4 10*3/uL (ref 0.1–1.0)
NEUTROS ABS: 5.1 10*3/uL (ref 1.7–7.7)
NEUTROS PCT: 61 % (ref 43–77)
Platelets: 146 10*3/uL — ABNORMAL LOW (ref 150–400)
RBC: 4.74 MIL/uL (ref 4.22–5.81)
RDW: 14.7 % (ref 11.5–15.5)
WBC: 8.3 10*3/uL (ref 4.0–10.5)

## 2014-01-05 LAB — BASIC METABOLIC PANEL
BUN: 13 mg/dL (ref 6–23)
CO2: 25 mEq/L (ref 19–32)
Calcium: 9 mg/dL (ref 8.4–10.5)
Chloride: 107 mEq/L (ref 96–112)
Creatinine, Ser: 1.22 mg/dL (ref 0.50–1.35)
GFR, EST AFRICAN AMERICAN: 67 mL/min — AB (ref >90)
GFR, EST NON AFRICAN AMERICAN: 58 mL/min — AB (ref >90)
Glucose, Bld: 121 mg/dL — ABNORMAL HIGH (ref 70–99)
POTASSIUM: 3.6 meq/L — AB (ref 3.7–5.3)
SODIUM: 147 meq/L (ref 137–147)

## 2014-01-05 LAB — POCT I-STAT TROPONIN I: Troponin i, poc: 0.02 ng/mL (ref 0.00–0.08)

## 2014-01-05 MED ORDER — ALBUTEROL (5 MG/ML) CONTINUOUS INHALATION SOLN
10.0000 mg/h | INHALATION_SOLUTION | Freq: Once | RESPIRATORY_TRACT | Status: AC
Start: 2014-01-05 — End: 2014-01-05
  Administered 2014-01-05: 10 mg/h via RESPIRATORY_TRACT
  Filled 2014-01-05: qty 20

## 2014-01-05 NOTE — ED Notes (Signed)
EMS-pt was seen at urgenct care for SOB and wheezing. Pt was dx'd with bilateral PNA. Pt was given 1gm of Rocephin IM, Decadron, and multiple breathing txs. Pt was noted to have wheezing, retractions, flare, and "not moving air well." 20g(R)FA. 5mg  breathing tx's x 2 given en route via EMS. EMS states pt is breathing better and moving more air then when they picked patient up from Urgent Care.

## 2014-01-05 NOTE — ED Provider Notes (Signed)
CSN: [id number]     Arrival date & time 01/05/14  2024 History   First MD Initiated Contact with Patient 01/05/14 2100     Chief Complaint  Patient presents with  . Shortness of Breath    Patient is a 73 y.o. male presenting with shortness of breath. The history is provided by the patient.  Shortness of Breath Severity:  Moderate Onset quality:  Gradual Duration:  1 day Timing:  Constant Progression:  Worsening Chronicity:  New Relieved by: nebulizer. Exacerbated by: rest. Associated symptoms: cough   Associated symptoms: no abdominal pain, no chest pain, no fever and no hemoptysis   pt presents with cough, SOB since yesterday No CP No LE edema No known fever No hemoptysis He was seen at urgent care and given nebulizers, steroids and antibiotics and sent for evaluation  Past Medical History  Diagnosis Date  . Atrial fibrillation     s/p prior DCCV;  Amiodarone Rx.  . CAD (coronary artery disease)     s/p CABG;  cath 10/07:  LM ok, LAD occluded, AV CFX 95%, pRCA occluded; L-LAD, S-OM2/OM3, S-PDA ok  . Cerebrovascular disease     s/p prior Left CEA;  followed by Dr. [name]  . COPD (chronic obstructive pulmonary disease)   . Hyperlipidemia   . Hypertension   . Mitral insufficiency     hx of. not noted on echo 01/2012  . Nephrolithiasis   . Ischemic cardiomyopathy     echo 5/09: EF 55%, mild LAE;  Myoview 11/10: inf and apical scar with mild peri-infarct ischemia, EF 44%. Echo 01/26/12 with EF 20-25% but previously known to be 30% on echoes before last one  . Iliac artery aneurysm, left     followed by Dr. [name]  . Aortic dissection     H/O focal aortic dissection  . LBBB (left bundle branch block)   . CHF (congestive heart failure)   . Myocardial infarction   . Renal insufficiency     01/2012   Past Surgical History  Procedure Laterality Date  . Uteroscopy    . Coronary artery bypass graft  1998  . Left cea  07/05  . Appendectomy  09/11/11  . Carotid endarterectomy   2005    left  . Biventricular defibrillator implantation  08/22/12    SJM Quadra Assura BiV ICD implanted by Dr [name]   Family History  Problem Relation Age of Onset  . Stroke Maternal Uncle    History  Substance Use Topics  . Smoking status: Former Smoker -- 1.50 packs/day for 57 years    Types: Cigarettes    Quit date: 12/11/2004  . Smokeless tobacco: Never Used  . Alcohol Use: No     Comment: 6 yrs ago     Review of Systems  Constitutional: Negative for fever.  Respiratory: Positive for cough and shortness of breath. Negative for hemoptysis.   Cardiovascular: Negative for chest pain.  Gastrointestinal: Negative for abdominal pain.  All other systems reviewed and are negative.    Allergies  Review of patient's allergies indicates no known allergies.  Home Medications   Current Outpatient Rx  Name  Route  Sig  Dispense  Refill  . albuterol (PROVENTIL HFA;VENTOLIN HFA) 108 (90 BASE) MCG/ACT inhaler   Inhalation   Inhale 2 puffs into the lungs every 4 (four) hours as needed for wheezing.   1 Inhaler   0   . amiodarone (PACERONE) 200 MG tablet      TAKE 1 TABLET  DAILY.   90 tablet   2   . amLODipine (NORVASC) 5 MG tablet   Oral   Take 2.5 mg by mouth daily.         . dabigatran (PRADAXA) 150 MG CAPS capsule   Oral   Take 150 mg by mouth 2 (two) times daily.         [name]. doxazosin (CARDURA) 8 MG tablet   Oral   Take 8 mg by mouth daily.         . furosemide (LASIX) 40 MG tablet   Oral   Take 1 tablet (40 mg total) by mouth daily.   30 tablet   0   . hydrALAZINE (APRESOLINE) 50 MG tablet   Oral   Take 25 mg by mouth 2 (two) times daily.         [name]. lisinopril (PRINIVIL,ZESTRIL) 40 MG tablet   Oral   Take 1 tablet (40 mg total) by mouth daily.   90 tablet   2   . meclizine (ANTIVERT) 25 MG tablet   Oral   Take 25 mg by mouth 3 (three) times daily as needed for dizziness.         . metoprolol succinate (TOPROL-XL) 25 MG 24 hr tablet    Oral   Take 12.5 mg by mouth daily.         . Multiple Vitamin (MULITIVITAMIN WITH MINERALS) TABS   Oral   Take 1 tablet by mouth daily.         . niacin (NIASPAN) 500 MG CR tablet   Oral   Take 1,000 mg by mouth 2 (two) times daily.          . nitroGLYCERIN (NITROSTAT) 0.4 MG SL tablet   Sublingual   Place 0.4 mg under the tongue every 5 (five) minutes as needed for chest pain.          . potassium chloride SA (KLOR-CON M20) 20 MEQ tablet   Oral   Take 2 tablets (40 mEq total) by mouth 3 (three) times daily.   540 tablet   3   . pravastatin (PRAVACHOL) 80 MG tablet      TAKE 1 TABLET EVERY DAY   90 tablet   1    BP 161/75  Pulse 83  Temp(Src) 98.1 F (36.7 C) (Oral)  Resp 19  SpO2 92%   Physical Exam CONSTITUTIONAL: Well developed/well nourished HEAD: Normocephalic/atraumatic EYES: EOMI/PERRL ENMT: Mucous membranes moist NECK: supple no meningeal signs SPINE:entire spine nontender CV: S1/S2 noted, no murmurs/rubs/gallops noted LUNGS: wheezing bilaterally, mild tachypnea noted.  ABDOMEN: soft, nontender, no rebound or guarding GU:no cva tenderness NEURO: Pt is awake/alert, moves all extremitiesx4 EXTREMITIES: pulses normal, full ROM, no LE edema noted SKIN: warm, color normal PSYCH: no abnormalities of mood noted  ED Course  Procedures (including critical care time) 9:30 PM Pt with wheezing and already has nebs Will start another treatment and reassess He has already received rocephin/decadron prior to arrival 12:08 AM Pt feels much improved His wheezing has nearly resolved He ambulated and denied dyspnea He is not hypoxic on RA (on my assessment RA pulse ox 94-95%) We discussed that he likely has pneumonia but is currently not septic and is improving I did offer admission for observation for continued neb treatment, but after discussing risk/benefit he would like to try home treatment first We discussed strict return precautions Labs  Review Labs Reviewed  BASIC METABOLIC PANEL - Abnormal; Notable for the following:  Potassium 3.6 (*)    Glucose, Bld 121 (*)    GFR calc non Af Amer 58 (*)    GFR calc Af Amer 67 (*)    All other components within normal limits  CBC WITH DIFFERENTIAL - Abnormal; Notable for the following:    Platelets 146 (*)    All other components within normal limits  PRO B NATRIURETIC PEPTIDE  POCT I-STAT TROPONIN I   Imaging Review Dg Chest Port 1 View  01/05/2014   CLINICAL DATA:  Shortness of breath  EXAM: PORTABLE CHEST - 1 VIEW  COMPARISON:  09/19/2013  FINDINGS: Enlarged cardiac silhouette. Status post median sternotomy and CABG. Aortic atherosclerosis and tortuosity, similar to prior. Mild interstitial prominence but Mild retrocardiac and right lung base opacities. Small effusions are not excluded. Left chest wall battery pack with unchanged lead tip positions. No pneumothorax. Limited osseous evaluation due to technique/patient body habitus.  IMPRESSION: Enlargement of the cardiac silhouette status post median sternotomy and CABG, similar to prior.  Mild interstitial prominence may reflect interstitial edema.  Mild lung base opacities may reflect atelectasis or early infiltrate.   Electronically Signed   By: Jearld Lesch M.D.   On: 01/05/2014 20:50    EKG Interpretation    Date/Time:  Monday January 05 2014 20:35:21 EST Ventricular Rate:  82 PR Interval:  147 QRS Duration: 156 QT Interval:  491 QTC Calculation: 574 R Axis:   -35 Text Interpretation:  Sinus or ectopic atrial rhythm Left bundle branch block Confirmed by Bebe Shaggy  MD, Janelle Culton ([id number]) on 01/05/2014 9:01:05 PM            MDM  No diagnosis found. Nursing notes including past medical history and social history reviewed and considered in documentation xrays reviewed and considered Labs/vital reviewed and considered     Joya Gaskins, MD 01/06/14 0010

## 2014-01-06 MED ORDER — PREDNISONE 50 MG PO TABS: ORAL_TABLET | ORAL | Status: DC

## 2014-01-06 MED ORDER — DOXYCYCLINE HYCLATE 100 MG PO CAPS: 100.0000 mg | ORAL_CAPSULE | Freq: Two times a day (BID) | ORAL | Status: DC

## 2014-01-14 ENCOUNTER — Encounter: Payer: Self-pay | Admitting: Nurse Practitioner

## 2014-01-14 ENCOUNTER — Ambulatory Visit (INDEPENDENT_AMBULATORY_CARE_PROVIDER_SITE_OTHER): Payer: Medicare Other | Admitting: Nurse Practitioner

## 2014-01-14 VITALS — BP 155/84 | HR 90 | Ht 73.0 in | Wt 273.0 lb

## 2014-01-14 DIAGNOSIS — G459 Transient cerebral ischemic attack, unspecified: Secondary | ICD-10-CM

## 2014-01-14 NOTE — Patient Instructions (Signed)
Continue Pradaxa for secondary stroke prevention and maintain strict control of hypertension with blood pressure goal below 130/90, diabetes with hemoglobin A1c goal below 6.5% and lipids with LDL cholesterol goal below 100 mg/dL.  Followup in the future in 6 months.  Repeat carotid dopplers are ordered with [facility].

## 2014-01-14 NOTE — Progress Notes (Signed)
PATIENT: [name] [name] DOB: 12/10/1941   REASON FOR VISIT: follow up for TIAs HISTORY FROM: patient  HISTORY OF PRESENT ILLNESS: [name] [name] is an 73 y.o. Caucasian Male who returns to our office for hospital admission follow up after 2 TIA episodes which lasted approximately 15 minutes each on 01/13/2013. During the TIAs patient experienced dizziness and vertical double vision. He experienced left-sided numbness and weakness. Patient was admitted as a code stroke for further evaluation and treatment. He was not a TPA candidate secondary to therapeutic INR of 2.1 on Coumadin. Patient has no complaints today. [facility] and is not experiencing any numbness, weakness, or double vision that he experienced with the TIAs. He was transitioned to Pradaxa after discharge and has had no complications.   UPDATE 07/14/13 (LL): Patient returns to the office for follow up since last visit on 04/14/13. He has no new neurovascular complaints, and says he has been doing well. He has an upcoming checkup appointment with Dr. Jens Somrenshaw, cardiology, Sept. 18 and sees Dr. Johney FrameAllred soon as well. He is still taking daily Pradaxa for history of afib. Patient denies medication side effects, with no signs of bleeding or excessive bruising. He states he does not check his BP at home unless he does not feel well. BP in office today is 144/77. He states he exercises by walking his dog in the neighborhood.   UPDATE 01/14/14 (LL):  Mr, [name] SkeensSecrest returns for TIA follow up.  He states he has been doing well, except for a recent bout of pneumonia, he went to urgent care and was referred to the ER because he was not moving any air.  His O2 sats were dropping after several albuterol treatments.  He is still taking daily Pradaxa for history of afib, is concerned about the cost.  Patient denies medication side effects, with no signs of bleeding or excessive bruising.   He states his BP is doing ok.  It is  155/84 in office today.  REVIEW OF SYSTEMS: Full 14 system review of systems performed and notable only for: no complaints.  ALLERGIES: No Known Allergies  HOME MEDICATIONS: Outpatient Prescriptions Prior to Visit  Medication Sig Dispense Refill  . albuterol (PROVENTIL HFA;VENTOLIN HFA) 108 (90 BASE) MCG/ACT inhaler Inhale 2 puffs into the lungs every 4 (four) hours as needed for wheezing.  1 Inhaler  0  . amiodarone (PACERONE) 200 MG tablet TAKE 1 TABLET DAILY.  90 tablet  2  . amLODipine (NORVASC) 5 MG tablet Take 2.5 mg by mouth daily.      . dabigatran (PRADAXA) 150 MG CAPS capsule Take 150 mg by mouth 2 (two) times daily.      [name]. doxazosin (CARDURA) 8 MG tablet Take 8 mg by mouth daily.      [name]. doxycycline (VIBRAMYCIN) 100 MG capsule Take 1 capsule (100 mg total) by mouth 2 (two) times daily.  20 capsule  0  . furosemide (LASIX) 40 MG tablet Take 1 tablet (40 mg total) by mouth daily.  30 tablet  0  . hydrALAZINE (APRESOLINE) 50 MG tablet Take 25 mg by mouth 2 (two) times daily.      [name]. lisinopril (PRINIVIL,ZESTRIL) 40 MG tablet Take 1 tablet (40 mg total) by mouth daily.  90 tablet  2  . meclizine (ANTIVERT) 25 MG tablet Take 25 mg by mouth 3 (three) times daily as needed for dizziness.      . metoprolol succinate (TOPROL-XL) 25 MG 24  hr tablet Take 12.5 mg by mouth daily.      . Multiple Vitamin (MULITIVITAMIN WITH MINERALS) TABS Take 1 tablet by mouth daily.      . niacin (NIASPAN) 500 MG CR tablet Take 1,000 mg by mouth 2 (two) times daily.       . nitroGLYCERIN (NITROSTAT) 0.4 MG SL tablet Place 0.4 mg under the tongue every 5 (five) minutes as needed for chest pain.       . potassium chloride SA (KLOR-CON M20) 20 MEQ tablet Take 2 tablets (40 mEq total) by mouth 3 (three) times daily.  540 tablet  3  . pravastatin (PRAVACHOL) 80 MG tablet TAKE 1 TABLET EVERY DAY  90 tablet  1  . predniSONE (DELTASONE) 50 MG tablet One tablet PO daily for 4 days  4 tablet  0   No  facility-administered medications prior to visit.    PHYSICAL EXAM  Filed Vitals:   01/14/14 1357  BP: 155/84  Pulse: 90  Height: 6\' 1"  (1.854 m)  Weight: 273 lb (123.832 kg)   Body mass index is 36.03 kg/(m^2).  Generalized: In no acute distress, pleasant Caucasian male Neck: Supple, no carotid bruits  Cardiac: Iregular rate rhythm, no murmur  Pulmonary: Expiratory wheezes bilaterally.  Musculoskeletal: No deformity   Neurological examination  Mentation: Alert oriented to time, place, history taking, language fluent, and causual conversation  Cranial nerve II-XII: Pupils were equal round reactive to light extraocular movements were full, visual field were full on confrontational test. facial sensation and strength were normal. hearing was intact to finger rubbing bilaterally. Uvula tongue midline. head turning and shoulder shrug and were normal and symmetric.Tongue protrusion into cheek strength was normal.  MOTOR: normal bulk and tone, full strength in the BUE, BLE, fine finger movements normal, no pronator drift  SENSORY: normal and symmetric to light touch, pinprick, temperature, vibration and proprioception  COORDINATION: finger-nose-finger, heel-to-shin bilaterally, there was no truncal ataxia  REFLEXES: Brachioradialis 2/2, biceps 2/2, triceps 2/2, patellar 1/1, Achilles 1/1.  GAIT/STATION: Rising up from seated position without assistance, normal stance, without trunk ataxia, moderate stride, good arm swing, smooth turning, able to perform tiptoe, and heel walking without difficulty. Tandem unsteady. Romberg negative.  DIAGNOSTIC DATA (LABS, IMAGING, TESTING) - I reviewed patient records, labs, notes, testing and imaging myself where available.  Lab Results  Component Value Date   WBC 8.3 01/05/2014   HGB 15.3 01/05/2014   HCT 45.5 01/05/2014   MCV 96.0 01/05/2014   PLT 146* 01/05/2014      Component Value Date/Time   NA 147 01/05/2014 2041   K 3.6* 01/05/2014 2041   CL  107 01/05/2014 2041   CO2 25 01/05/2014 2041   GLUCOSE 121* 01/05/2014 2041   BUN 13 01/05/2014 2041   CREATININE 1.22 01/05/2014 2041   CREATININE 1.36* 11/27/2011 1124   CALCIUM 9.0 01/05/2014 2041   PROT 6.6 09/19/2013 1326   ALBUMIN 4.1 09/19/2013 1326   AST 23 09/19/2013 1326   ALT 24 09/19/2013 1326   ALKPHOS 78 09/19/2013 1326   BILITOT 1.2 09/19/2013 1326   GFRNONAA 58* 01/05/2014 2041   GFRAA 67* 01/05/2014 2041   Lab Results  Component Value Date   CHOL 131 01/14/2013   HDL 42 01/14/2013   LDLCALC 60 01/14/2013   LDLDIRECT 91.8 04/28/2010   TRIG 143 01/14/2013   CHOLHDL 3.1 01/14/2013   Lab Results  Component Value Date   HGBA1C 5.6 01/14/2013   No results found for this  basename: [id number]   Lab Results  Component Value Date   TSH 0.50 09/19/2013    ASSESSMENT AND PLAN  Mr. Nard is a 73 year old right-handed Caucasian male who experienced 2 posterior circulationTIA events on January 13, 2013. Each event lasted less than 30 minutes in which the patient experienced vertical diplopia, weakness and numbness in the left arm and leg. CT head and neck from February 4 shows diffuse atherosclerotic disease but no significant carotid stenosis. Patient is doing very well with no residual effects of the TIAs. Mr. Hennesy has significant risk factors for stroke including atrial fibrillation, hypertension, hyperlipidemia, and obesity.   PLAN: Discussed anticoagulation options with patient at length and advised carefully considering the cost vs. Benefit of staying on Pradaxa or changing back to Coumadin.   He states he will give it further thought and contact his Cardiologist if he wants to change. Continue Pradaxa  for secondary stroke prevention and maintain strict control of hypertension with blood pressure goal below 130/90, diabetes with hemoglobin A1c goal below 6.5% and lipids with LDL cholesterol goal below 100 mg/dL.  Followup in the future in 6 months. Repeat carotid dopplers are  ordered with [facility].  Tawny Asal Eino Whitner, MSN, NP-C 01/14/2014, 2:00 PM [facility] [address] [place], [place] [address] [phone]  Note: This document was prepared with digital dictation and possible smart phrase technology. Any transcriptional errors that result from this process are unintentional.

## 2014-01-21 ENCOUNTER — Other Ambulatory Visit: Payer: Self-pay | Admitting: Vascular Surgery

## 2014-01-21 DIAGNOSIS — I6529 Occlusion and stenosis of unspecified carotid artery: Secondary | ICD-10-CM

## 2014-01-21 DIAGNOSIS — Z48812 Encounter for surgical aftercare following surgery on the circulatory system: Secondary | ICD-10-CM

## 2014-01-29 ENCOUNTER — Encounter: Payer: Self-pay | Admitting: Internal Medicine

## 2014-02-03 ENCOUNTER — Encounter: Payer: Self-pay | Admitting: Nurse Practitioner

## 2014-02-10 ENCOUNTER — Ambulatory Visit (INDEPENDENT_AMBULATORY_CARE_PROVIDER_SITE_OTHER): Payer: Medicare Other | Admitting: Emergency Medicine

## 2014-02-10 ENCOUNTER — Encounter: Payer: Self-pay | Admitting: Emergency Medicine

## 2014-02-10 VITALS — BP 130/84 | HR 70 | Ht 73.0 in | Wt 273.0 lb

## 2014-02-10 DIAGNOSIS — I6529 Occlusion and stenosis of unspecified carotid artery: Secondary | ICD-10-CM

## 2014-02-10 DIAGNOSIS — J449 Chronic obstructive pulmonary disease, unspecified: Secondary | ICD-10-CM

## 2014-02-10 MED ORDER — IPRATROPIUM-ALBUTEROL 0.5-2.5 (3) MG/3ML IN SOLN: 3.0000 mL | RESPIRATORY_TRACT | Status: DC | PRN

## 2014-02-10 NOTE — Patient Instructions (Signed)
Please increase your albuterol/ipratropium nebulizers to 4x a day Follow with Dr Delton Coombes in 6 months or sooner if you have any problems.

## 2014-02-10 NOTE — Assessment & Plan Note (Signed)
-   will increase duonebs to qid. Discussed possibly changing to spiriva or anoro but they are rightly concerned about cost.  - rov 6

## 2014-02-10 NOTE — Progress Notes (Signed)
  Subjective:    Patient ID: [name] [name], male    DOB: Jan 18, 1941, 73 y.o.   MRN: [id number] HPI 73 yo man, former tobacco (80+ pk-yrs), hx of CAD, HTN, A Fib, carotid dz. Was hospitalized 9-09/2011 for appendectomy, and during that hosp was found to have hypoxemia. Pulm consulted and he was suspected to have COPD + restriction due to abd compliance. He is not quite back up to usual functional capacity yet. He describes exertional SOB after walking 200 ft. No real cough. On coumadin for A Fib. He has had PFT at [facility] several yrs ago. Not on BD's at this time.   ROV 11/14/11 -- Hx Tobacco use, dyspnea, returns to eval SOB following his PFT today. Shows mild AFL, no BD response, normal volumes. He has been dieting, has lost about 20 lbs since hospitalization. His breathing is better - still with some limitations but able to do more.   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 Follows at [facility] cards for CAD, HTN, A Fib. Since last time he has had a pacer placed, followed by Dr [name]. He had some SOB that improved when the pacer was placed. He is not on scheduled BD's at this time. He sometimes has wheezing, no associated CP or tightness. He used SABA a few times but never really helped him. He has put back on about 15 lbs.   ROV 02/10/14 -- hx COPD, mild AFL. Also with CAD, HTN, A Fib s/p pacer. He returns today and reports that he was treated for an AE and CAP about 1 month ago. He had wheezing, coughing, non-productive. He was treated with corticosteroids and abx. He now feels better. He hears some wheeze. He uses SABA rarely, more often in the Summer. He is on DuoNebs bid that he started 3-4 weeks ago. He believes that it is helping him.       Objective:   Physical Exam Filed Vitals:   02/10/14 1501  BP: 130/84  Pulse: 70  Height: 6\' 1"  (1.854 m)  Weight: 273 lb (123.832 kg)  SpO2: 97%   Gen: Pleasant, obese, in no distress,  normal affect  ENT: No lesions,  mouth clear,   oropharynx clear, no postnasal drip  Neck: No JVD, no TMG, no carotid bruits  Lungs: No use of accessory muscles, no dullness to percussion, mild low-pitched wheeze on forced exp  Cardiovascular: RRR, heart sounds normal, no murmur or gallops, no peripheral edema  Musculoskeletal: No deformities, no cyanosis or clubbing  Neuro: alert, non focal  Skin: Warm, no lesions or rashes     Assessment & Plan:  COPD (chronic obstructive pulmonary disease) - will increase duonebs to qid. Discussed possibly changing to spiriva or anoro but they are rightly concerned about cost.  - rov 6

## 2014-02-13 ENCOUNTER — Telehealth: Payer: Self-pay | Admitting: Emergency Medicine

## 2014-02-13 MED ORDER — IPRATROPIUM-ALBUTEROL 0.5-2.5 (3) MG/3ML IN SOLN
3.0000 mL | RESPIRATORY_TRACT | Status: DC | PRN
Start: 2014-02-13 — End: 2014-04-24

## 2014-02-13 NOTE — Telephone Encounter (Signed)
I have attached the dx code to the prescription. Pt is aware that this has been sent to [facility].

## 2014-02-25 ENCOUNTER — Ambulatory Visit (INDEPENDENT_AMBULATORY_CARE_PROVIDER_SITE_OTHER): Payer: Medicare Other | Admitting: *Deleted

## 2014-02-25 ENCOUNTER — Encounter: Payer: Self-pay | Admitting: Internal Medicine

## 2014-02-25 DIAGNOSIS — I2589 Other forms of chronic ischemic heart disease: Secondary | ICD-10-CM

## 2014-02-25 DIAGNOSIS — I5022 Chronic systolic (congestive) heart failure: Secondary | ICD-10-CM

## 2014-02-27 LAB — MDC_IDC_ENUM_SESS_TYPE_REMOTE
Battery Remaining Longevity: 63 mo
Brady Statistic RA Percent Paced: 71 %
Brady Statistic RV Percent Paced: 93 %
HIGH POWER IMPEDANCE MEASURED VALUE: 80 Ohm
Implantable Pulse Generator Model: 3265
Lead Channel Impedance Value: 450 Ohm
Lead Channel Pacing Threshold Amplitude: 0.875 V
Lead Channel Pacing Threshold Amplitude: 1 V
Lead Channel Pacing Threshold Pulse Width: 0.5 ms
Lead Channel Pacing Threshold Pulse Width: 0.5 ms
Lead Channel Pacing Threshold Pulse Width: 0.5 ms
Lead Channel Sensing Intrinsic Amplitude: 5 mV
Lead Channel Setting Pacing Amplitude: 1.625
Lead Channel Setting Pacing Amplitude: 2 V
Lead Channel Setting Pacing Amplitude: 2.125
Lead Channel Setting Pacing Pulse Width: 0.5 ms
Lead Channel Setting Sensing Sensitivity: 0.5 mV
MDC IDC MSMT LEADCHNL RA IMPEDANCE VALUE: 450 Ohm
MDC IDC MSMT LEADCHNL RV IMPEDANCE VALUE: 450 Ohm
MDC IDC MSMT LEADCHNL RV PACING THRESHOLD AMPLITUDE: 0.75 V
MDC IDC MSMT LEADCHNL RV SENSING INTR AMPL: 11.7 mV
MDC IDC PG SERIAL: 1075181
MDC IDC SET LEADCHNL LV PACING PULSEWIDTH: 0.5 ms
Zone Setting Detection Interval: 280 ms
Zone Setting Detection Interval: 340 ms

## 2014-03-04 ENCOUNTER — Telehealth: Payer: Self-pay | Admitting: Cardiology

## 2014-03-04 NOTE — Telephone Encounter (Signed)
New message     Talk to the nurse regarding changing medications

## 2014-03-04 NOTE — Telephone Encounter (Signed)
Spoke with pt, he wanted to know about changing back to the warfarin because the pradaxa is too expensive. He was given xarelto and eliquis names to check with his insurance to see if one of those will be covered. He will let me know what he finds out. He may need to change back to warfarin if too expensive.

## 2014-03-09 ENCOUNTER — Other Ambulatory Visit: Payer: Self-pay | Admitting: *Deleted

## 2014-03-09 MED ORDER — DABIGATRAN ETEXILATE MESYLATE 150 MG PO CAPS: 150.0000 mg | ORAL_CAPSULE | Freq: Two times a day (BID) | ORAL | Status: DC

## 2014-03-09 NOTE — Progress Notes (Signed)
ICD remote with ICM 

## 2014-03-11 ENCOUNTER — Ambulatory Visit (INDEPENDENT_AMBULATORY_CARE_PROVIDER_SITE_OTHER): Payer: Medicare Other | Admitting: *Deleted

## 2014-03-11 ENCOUNTER — Encounter: Payer: Self-pay | Admitting: Internal Medicine

## 2014-03-11 ENCOUNTER — Encounter: Payer: Self-pay | Admitting: *Deleted

## 2014-03-11 DIAGNOSIS — R930 Abnormal findings on diagnostic imaging of skull and head, not elsewhere classified: Secondary | ICD-10-CM

## 2014-03-11 DIAGNOSIS — I5022 Chronic systolic (congestive) heart failure: Secondary | ICD-10-CM

## 2014-03-11 LAB — MDC_IDC_ENUM_SESS_TYPE_INCLINIC
Battery Remaining Longevity: 64.8 mo
Brady Statistic RV Percent Paced: 94 %
HIGH POWER IMPEDANCE MEASURED VALUE: 84.375
Implantable Pulse Generator Model: 3265
Implantable Pulse Generator Serial Number: 1075181
Lead Channel Impedance Value: 462.5 Ohm
Lead Channel Impedance Value: 487.5 Ohm
Lead Channel Impedance Value: 575 Ohm
Lead Channel Pacing Threshold Amplitude: 0.875 V
Lead Channel Pacing Threshold Amplitude: 0.875 V
Lead Channel Pacing Threshold Pulse Width: 0.5 ms
Lead Channel Pacing Threshold Pulse Width: 0.5 ms
Lead Channel Pacing Threshold Pulse Width: 0.5 ms
Lead Channel Sensing Intrinsic Amplitude: 12 mV
Lead Channel Sensing Intrinsic Amplitude: 5 mV
Lead Channel Setting Pacing Amplitude: 1.875
Lead Channel Setting Pacing Amplitude: 2 V
Lead Channel Setting Pacing Pulse Width: 0.5 ms
Lead Channel Setting Pacing Pulse Width: 0.5 ms
Lead Channel Setting Sensing Sensitivity: 0.5 mV
MDC IDC MSMT LEADCHNL RV PACING THRESHOLD AMPLITUDE: 0.625 V
MDC IDC SESS DTM: 20150401155047
MDC IDC SET LEADCHNL LV PACING AMPLITUDE: 2 V
MDC IDC STAT BRADY RA PERCENT PACED: 71 %
Zone Setting Detection Interval: 280 ms
Zone Setting Detection Interval: 340 ms

## 2014-03-11 NOTE — Progress Notes (Signed)
Interrogation for PMT episodes noted on [name].  V-A conduction @ .  No changes in PVARP.  UTI 130bpm.  Follow up as scheduled with [name] 06/01/14.

## 2014-03-30 ENCOUNTER — Encounter: Payer: Self-pay | Admitting: Internal Medicine

## 2014-04-15 ENCOUNTER — Other Ambulatory Visit: Payer: Self-pay | Admitting: Cardiology

## 2014-04-24 ENCOUNTER — Encounter (HOSPITAL_COMMUNITY): Payer: Self-pay | Admitting: Emergency Medicine

## 2014-04-24 ENCOUNTER — Emergency Department (HOSPITAL_COMMUNITY): Payer: Medicare Other

## 2014-04-24 ENCOUNTER — Emergency Department (HOSPITAL_COMMUNITY)
Admission: EM | Admit: 2014-04-24 | Discharge: 2014-04-25 | Disposition: A | Discharge status: Home / Self Care | Payer: Medicare Other | Attending: Emergency Medicine | Admitting: Emergency Medicine

## 2014-04-24 DIAGNOSIS — I252 Old myocardial infarction: Secondary | ICD-10-CM | POA: Insufficient documentation

## 2014-04-24 DIAGNOSIS — I2510 Atherosclerotic heart disease of native coronary artery without angina pectoris: Secondary | ICD-10-CM | POA: Insufficient documentation

## 2014-04-24 DIAGNOSIS — J4489 Other specified chronic obstructive pulmonary disease: Secondary | ICD-10-CM | POA: Insufficient documentation

## 2014-04-24 DIAGNOSIS — Z87891 Personal history of nicotine dependence: Secondary | ICD-10-CM | POA: Insufficient documentation

## 2014-04-24 DIAGNOSIS — Z951 Presence of aortocoronary bypass graft: Secondary | ICD-10-CM | POA: Insufficient documentation

## 2014-04-24 DIAGNOSIS — I509 Heart failure, unspecified: Secondary | ICD-10-CM | POA: Insufficient documentation

## 2014-04-24 DIAGNOSIS — Z8673 Personal history of transient ischemic attack (TIA), and cerebral infarction without residual deficits: Secondary | ICD-10-CM | POA: Insufficient documentation

## 2014-04-24 DIAGNOSIS — J449 Chronic obstructive pulmonary disease, unspecified: Secondary | ICD-10-CM | POA: Insufficient documentation

## 2014-04-24 DIAGNOSIS — Z79899 Other long term (current) drug therapy: Secondary | ICD-10-CM | POA: Insufficient documentation

## 2014-04-24 DIAGNOSIS — M545 Low back pain, unspecified: Secondary | ICD-10-CM | POA: Insufficient documentation

## 2014-04-24 DIAGNOSIS — E785 Hyperlipidemia, unspecified: Secondary | ICD-10-CM | POA: Insufficient documentation

## 2014-04-24 DIAGNOSIS — I4891 Unspecified atrial fibrillation: Secondary | ICD-10-CM | POA: Insufficient documentation

## 2014-04-24 DIAGNOSIS — I10 Essential (primary) hypertension: Secondary | ICD-10-CM | POA: Insufficient documentation

## 2014-04-24 DIAGNOSIS — M549 Dorsalgia, unspecified: Secondary | ICD-10-CM

## 2014-04-24 DIAGNOSIS — Z87442 Personal history of urinary calculi: Secondary | ICD-10-CM | POA: Insufficient documentation

## 2014-04-24 DIAGNOSIS — Z87448 Personal history of other diseases of urinary system: Secondary | ICD-10-CM | POA: Insufficient documentation

## 2014-04-24 LAB — URINALYSIS, ROUTINE W REFLEX MICROSCOPIC
GLUCOSE, UA: NEGATIVE mg/dL
HGB URINE DIPSTICK: NEGATIVE
KETONES UR: NEGATIVE mg/dL
Leukocytes, UA: NEGATIVE
Nitrite: NEGATIVE
PROTEIN: 30 mg/dL — AB
Specific Gravity, Urine: 1.026 (ref 1.005–1.030)
UROBILINOGEN UA: 1 mg/dL (ref 0.0–1.0)
pH: 5.5 (ref 5.0–8.0)

## 2014-04-24 LAB — COMPREHENSIVE METABOLIC PANEL
ALBUMIN: 3.7 g/dL (ref 3.5–5.2)
ALK PHOS: 90 U/L (ref 39–117)
ALT: 14 U/L (ref 0–53)
AST: 19 U/L (ref 0–37)
BILIRUBIN TOTAL: 0.3 mg/dL (ref 0.3–1.2)
BUN: 22 mg/dL (ref 6–23)
CHLORIDE: 106 meq/L (ref 96–112)
CO2: 26 mEq/L (ref 19–32)
Calcium: 9.3 mg/dL (ref 8.4–10.5)
Creatinine, Ser: 1.83 mg/dL — ABNORMAL HIGH (ref 0.50–1.35)
GFR calc Af Amer: 41 mL/min — ABNORMAL LOW (ref >90)
GFR, EST NON AFRICAN AMERICAN: 35 mL/min — AB (ref >90)
Glucose, Bld: 109 mg/dL — ABNORMAL HIGH (ref 70–99)
POTASSIUM: 3.8 meq/L (ref 3.7–5.3)
SODIUM: 146 meq/L (ref 137–147)
Total Protein: 6.5 g/dL (ref 6.0–8.3)

## 2014-04-24 LAB — PROTIME-INR
INR: 1.28 (ref 0.00–1.49)
PROTHROMBIN TIME: 15.7 s — AB (ref 11.6–15.2)

## 2014-04-24 LAB — CBC WITH DIFFERENTIAL/PLATELET
BASOS ABS: 0.1 10*3/uL (ref 0.0–0.1)
BASOS PCT: 1 % (ref 0–1)
Eosinophils Absolute: 0.5 10*3/uL (ref 0.0–0.7)
Eosinophils Relative: 7 % — ABNORMAL HIGH (ref 0–5)
HEMATOCRIT: 45.2 % (ref 39.0–52.0)
Hemoglobin: 14.8 g/dL (ref 13.0–17.0)
Lymphocytes Relative: 29 % (ref 12–46)
Lymphs Abs: 2.1 10*3/uL (ref 0.7–4.0)
MCH: 31.2 pg (ref 26.0–34.0)
MCHC: 32.7 g/dL (ref 30.0–36.0)
MCV: 95.4 fL (ref 78.0–100.0)
Monocytes Absolute: 0.5 10*3/uL (ref 0.1–1.0)
Monocytes Relative: 7 % (ref 3–12)
NEUTROS ABS: 4 10*3/uL (ref 1.7–7.7)
NEUTROS PCT: 56 % (ref 43–77)
Platelets: 165 10*3/uL (ref 150–400)
RBC: 4.74 MIL/uL (ref 4.22–5.81)
RDW: 14.8 % (ref 11.5–15.5)
WBC: 7.2 10*3/uL (ref 4.0–10.5)

## 2014-04-24 LAB — URINE MICROSCOPIC-ADD ON

## 2014-04-24 LAB — APTT: APTT: 54 s — AB (ref 24–37)

## 2014-04-24 MED ORDER — ONDANSETRON HCL 4 MG/2ML IJ SOLN
4.0000 mg | Freq: Once | INTRAMUSCULAR | Status: AC
  Administered 2014-04-24: 4 mg via INTRAVENOUS
  Filled 2014-04-24: qty 2

## 2014-04-24 MED ORDER — HYDROMORPHONE HCL PF 1 MG/ML IJ SOLN
1.0000 mg | Freq: Once | INTRAMUSCULAR | Status: AC
  Administered 2014-04-24: 1 mg via INTRAVENOUS
  Filled 2014-04-24: qty 1

## 2014-04-24 NOTE — ED Provider Notes (Signed)
CSN: [id number]     Arrival date & time 04/24/14  2019 History   First MD Initiated Contact with Patient 04/24/14 2051     Chief Complaint  Patient presents with  . Flank Pain   Patient is a 73 year old male with past medical history as below relevant for atrial for ablation on preaccident, coronary artery disease, COPD, and known abdominal aortic aneurysm who presents for back pain. The patient is diffuse lumbar spine and nonradiating. Pain is moderate in severity and aching in quality. Pain is from present for one week. He denies any trauma or inciting event, but his wife has recently broken her foot and has required patient pushing her wheelchair. He denies bowel or bladder incontinence, fevers, weakness, numbness, or abdominal pain. Patient was seen in urgent care prior to arrival today and they're in x-ray was completed. X-ray revealed aortic calcifications concerning for aneurysm and patient was instructed to come here to the emergency department for further evaluation  (Consider location/radiation/quality/duration/timing/severity/associated sxs/prior Treatment) Patient is a 73 y.o. male presenting with back pain. The history is provided by the patient and medical records. No language interpreter was used.  Back Pain Location:  Lumbar spine Quality:  Aching Radiates to:  Does not radiate Pain severity:  Moderate Pain is:  Same all the time Onset quality:  Gradual Duration:  7 days Timing:  Constant Progression:  Worsening Chronicity:  New Context: not physical stress, not recent illness and not twisting   Relieved by:  Nothing Worsened by:  Movement, standing and twisting Ineffective treatments:  Ibuprofen, NSAIDs and OTC medications Associated symptoms: abdominal pain (mild left sided)     Past Medical History  Diagnosis Date  . Atrial fibrillation     s/p prior DCCV;  Amiodarone Rx.  . CAD (coronary artery disease)     s/p CABG;  cath 10/07:  LM ok, LAD occluded, AV CFX 95%,  pRCA occluded; L-LAD, S-OM2/OM3, S-PDA ok  . Cerebrovascular disease     s/p prior Left CEA;  followed by Dr. Arbie CookeyEarly  . COPD (chronic obstructive pulmonary disease)   . Hyperlipidemia   . Hypertension   . Mitral insufficiency     hx of. not noted on echo 01/2012  . Nephrolithiasis   . Ischemic cardiomyopathy     echo 5/09: EF 55%, mild LAE;  Myoview 11/10: inf and apical scar with mild peri-infarct ischemia, EF 44%. Echo 01/26/12 with EF 20-25% but previously known to be 30% on echoes before last one  . Iliac artery aneurysm, left     followed by Dr. Arbie CookeyEarly  . Aortic dissection     H/O focal aortic dissection  . LBBB (left bundle branch block)   . CHF (congestive heart failure)   . Myocardial infarction   . Renal insufficiency     01/2012   Past Surgical History  Procedure Laterality Date  . Uteroscopy    . Coronary artery bypass graft  1998  . Left cea  07/05  . Appendectomy  09/11/11  . Carotid endarterectomy  2005    left  . Biventricular defibrillator implantation  08/22/12    SJM Quadra Assura BiV ICD implanted by Dr Johney FrameAllred   Family History  Problem Relation Age of Onset  . Stroke Maternal Uncle    History  Substance Use Topics  . Smoking status: Former Smoker -- 1.50 packs/day for 57 years    Types: Cigarettes    Quit date: 12/11/2004  . Smokeless tobacco: Never Used  . Alcohol  Use: No     Comment: 6 yrs ago     Review of Systems  Gastrointestinal: Positive for abdominal pain (mild left sided).  Musculoskeletal: Positive for back pain.  All other systems reviewed and are negative.     Allergies  Review of patient's allergies indicates no known allergies.  Home Medications   Prior to Admission medications   Medication Sig Start Date End Date Taking? Authorizing Provider  albuterol (PROVENTIL HFA;VENTOLIN HFA) 108 (90 BASE) MCG/ACT inhaler Inhale 2 puffs into the lungs every 4 (four) hours as needed for wheezing. 03/29/13   Suzi Roots, MD  amiodarone  (PACERONE) 200 MG tablet TAKE 1 TABLET DAILY. 10/29/13   Lewayne Bunting, MD  amLODipine (NORVASC) 5 MG tablet Take 2.5 mg by mouth daily.    Historical Provider, MD  dabigatran (PRADAXA) 150 MG CAPS capsule Take 1 capsule (150 mg total) by mouth 2 (two) times daily. 03/09/14   Lewayne Bunting, MD  doxazosin (CARDURA) 8 MG tablet Take 8 mg by mouth daily.    Historical Provider, MD  furosemide (LASIX) 40 MG tablet Take 1 tablet (40 mg total) by mouth daily. 08/29/13   Lewayne Bunting, MD  hydrALAZINE (APRESOLINE) 50 MG tablet Take 25 mg by mouth 2 (two) times daily. 05/12/13   Lewayne Bunting, MD  ipratropium-albuterol (DUONEB) 0.5-2.5 (3) MG/3ML SOLN Take 3 mLs by nebulization every 4 (four) hours as needed. 02/13/14   Leslye Peer, MD  lisinopril (PRINIVIL,ZESTRIL) 40 MG tablet Take 1 tablet (40 mg total) by mouth daily. 07/15/13   Lewayne Bunting, MD  metoprolol succinate (TOPROL-XL) 25 MG 24 hr tablet Take 12.5 mg by mouth daily.    Historical Provider, MD  Multiple Vitamin (MULITIVITAMIN WITH MINERALS) TABS Take 1 tablet by mouth daily.    Historical Provider, MD  niacin (NIASPAN) 500 MG CR tablet Take 1,000 mg by mouth 2 (two) times daily.     Historical Provider, MD  nitroGLYCERIN (NITROSTAT) 0.4 MG SL tablet Place 0.4 mg under the tongue every 5 (five) minutes as needed for chest pain.     Historical Provider, MD  potassium chloride SA (KLOR-CON M20) 20 MEQ tablet Take 2 tablets (40 mEq total) by mouth 3 (three) times daily. 05/12/13   Lewayne Bunting, MD  pravastatin (PRAVACHOL) 80 MG tablet TAKE 1 TABLET EVERY DAY    Lewayne Bunting, MD   BP 167/128  Pulse 73  Temp(Src) 98.2 F (36.8 C) (Oral)  Resp 24  Ht 6\' 1"  (1.854 m)  Wt 264 lb 6.4 oz (119.931 kg)  BMI 34.89 kg/m2  SpO2 94% Physical Exam  Nursing note and vitals reviewed. Constitutional: He is oriented to person, place, and time. He appears well-developed and well-nourished.  HENT:  Head: Normocephalic and atraumatic.   Right Ear: External ear normal.  Left Ear: External ear normal.  Eyes: Conjunctivae are normal. Pupils are equal, round, and reactive to light.  Neck: Normal range of motion. Neck supple.  Cardiovascular: Normal rate, regular rhythm, normal heart sounds and intact distal pulses.   Pulmonary/Chest: Effort normal and breath sounds normal. He exhibits no tenderness.  Abdominal: Soft. Bowel sounds are normal. He exhibits no distension. There is tenderness (mild LLQ). There is no rebound and no guarding.  Musculoskeletal: Normal range of motion. He exhibits tenderness (TTP over right and left paraspinal muscles of lumbar spine; pain with ext/flex/rotation of torso.  ). He exhibits no edema.  Neurological: He is alert and  oriented to person, place, and time.  Skin: Skin is warm.  Psychiatric: He has a normal mood and affect.    ED Course  Procedures (including critical care time) Labs Review Labs Reviewed  CBC WITH DIFFERENTIAL - Abnormal; Notable for the following:    Eosinophils Relative 7 (*)    All other components within normal limits  COMPREHENSIVE METABOLIC PANEL - Abnormal; Notable for the following:    Glucose, Bld 109 (*)    Creatinine, Ser 1.83 (*)    GFR calc non Af Amer 35 (*)    GFR calc Af Amer 41 (*)    All other components within normal limits  URINALYSIS, ROUTINE W REFLEX MICROSCOPIC - Abnormal; Notable for the following:    Bilirubin Urine SMALL (*)    Protein, ur 30 (*)    All other components within normal limits  PROTIME-INR - Abnormal; Notable for the following:    Prothrombin Time 15.7 (*)    All other components within normal limits  APTT - Abnormal; Notable for the following:    aPTT 54 (*)    All other components within normal limits  URINE MICROSCOPIC-ADD ON - Abnormal; Notable for the following:    Casts HYALINE CASTS (*)    All other components within normal limits  SAMPLE TO BLOOD BANK    Imaging Review US Aorta  04/24/2014   CLINICAL DATA:   Abdominal and back pain with known abdominal aortic aneurysm.  EXAM: ULTRASOUND OF ABDOMINAL AORTA  TECHNIQUE: Ultrasound examination of the abdominal aorta was performed to evaluate for abdominal aortic aneurysm.  COMPARISON:  None.  FINDINGS: Abdominal Aorta  The study is very limited by bowel gas. Proximally the abdominal aorta measures 3.1 cm AP x 3.2 cm transversely. In the mid aorta the AP dimension was seen to be 3 cm but the transverse dimension could not be obtained. The more distal portions of the abdominal aorta as well as the common iliac arteries were obscured.  Maximum AP  Diameter:  3.1 cm  Maximum TRV  Diameter:  3.2 cm  IMPRESSION: This is a very limited study. The maximal measured diameter of the visualized portion of the abdominal aorta is 3.2 cm and this is in the transverse plane in the proximal aorta. CT scanning of the abdomen and pelvis is recommended.   Electronically Signed   By: David  [place]   On: 04/24/2014 23:16     EKG Interpretation None      MDM   Final diagnoses:  Back pain    Patient presents emergency Department with back pain an x-ray done at urgent care concerning for abdominal aortic aneurysm. Patient was afebrile, no tachycardia, no hypotension was observed. Physical exam as above and remarkable for no rebound or guarding of abdomen. Given patient's known history of aneurysm it was felt that ultrasound of the aorta was warranted as his creatinine was elevated and no emergent indication for CT was present.  US shows stable AAA and no other abnormalities present.  Other labs were unremarkable including no hematuria or anemia.  Discussion with family completed in regards to US findings and they were in agreement with discharge.  Strict return precautions given including worsening pain, lightheadedness, fever, or with other concerning symptoms.  Patient already prescribed medications for MSK back pain and he was encouraged to continue with these medications and this  is likely source of back pain.      Johnney Ou, MD 04/25/14 [phone]

## 2014-04-24 NOTE — ED Notes (Signed)
Patient transported to Ultrasound 

## 2014-04-24 NOTE — Discharge Instructions (Signed)
Back Pain, Adult  Back pain is very common. The pain often gets better over time. The cause of back pain is usually not dangerous. Most people can learn to manage their back pain on their own.   HOME CARE   · Stay active. Start with short walks on flat ground if you can. Try to walk farther each day.  · Do not sit, drive, or stand in one place for more than 30 minutes. Do not stay in bed.  · Do not avoid exercise or work. Activity can help your back heal faster.  · Be careful when you bend or lift an object. Bend at your knees, keep the object close to you, and do not twist.  · Sleep on a firm mattress. Lie on your side, and bend your knees. If you lie on your back, put a pillow under your knees.  · Only take medicines as told by your doctor.  · Put ice on the injured area.  · Put ice in a plastic bag.  · Place a towel between your skin and the bag.  · Leave the ice on for 15-20 minutes, 03-04 times a day for the first 2 to 3 days. After that, you can switch between ice and heat packs.  · Ask your doctor about back exercises or massage.  · Avoid feeling anxious or stressed. Find good ways to deal with stress, such as exercise.  GET HELP RIGHT AWAY IF:   · Your pain does not go away with rest or medicine.  · Your pain does not go away in 1 week.  · You have new problems.  · You do not feel well.  · The pain spreads into your legs.  · You cannot control when you poop (bowel movement) or pee (urinate).  · Your arms or legs feel weak or lose feeling (numbness).  · You feel sick to your stomach (nauseous) or throw up (vomit).  · You have belly (abdominal) pain.  · You feel like you may pass out (faint).  MAKE SURE YOU:   · Understand these instructions.  · Will watch your condition.  · Will get help right away if you are not doing well or get worse.  Document Released: 05/15/2008 Document Revised: 02/19/2012 Document Reviewed: 04/17/2011  Exit[employer]® Patient Information ©2014 [employer].

## 2014-04-24 NOTE — ED Notes (Signed)
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 Pt states he has 8/10 pain. Pt states he took some prescriptions medication for his pain around 6pm. Pt states he doesn't have any difficulty urinated but he does have some pain.

## 2014-04-24 NOTE — ED Notes (Signed)
The pt has had rt flank pain for one week.  He saw his doctor today and had lab work and c-t scan.  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

## 2014-04-25 LAB — SAMPLE TO BLOOD BANK

## 2014-04-25 NOTE — ED Provider Notes (Signed)
This patient was seen with the resident physician, Dr. Ronald Lobo.  The documentation accurately reflects the patient's Emergency Department course.  On my exam the patient was seen in no distress, resting comfortably. With a long conversation about all imaging results, the need for additional evaluation as an outpatient, and the lack of immediate suspicion for progression / rupture of his AAA.  Gerhard Munch, MD 04/25/14 [phone]

## 2014-04-30 ENCOUNTER — Ambulatory Visit (INDEPENDENT_AMBULATORY_CARE_PROVIDER_SITE_OTHER)
Admission: RE | Admit: 2014-04-30 | Discharge: 2014-04-30 | Disposition: A | Discharge status: Home / Self Care | Payer: Medicare Other | Source: Ambulatory Visit | Attending: Cardiology | Admitting: Cardiology

## 2014-04-30 ENCOUNTER — Encounter: Payer: Self-pay | Admitting: Cardiology

## 2014-04-30 ENCOUNTER — Ambulatory Visit (INDEPENDENT_AMBULATORY_CARE_PROVIDER_SITE_OTHER): Payer: Medicare Other | Admitting: Cardiology

## 2014-04-30 VITALS — BP 148/84 | HR 62 | Ht 73.0 in | Wt 264.1 lb

## 2014-04-30 DIAGNOSIS — I2589 Other forms of chronic ischemic heart disease: Secondary | ICD-10-CM

## 2014-04-30 DIAGNOSIS — I6529 Occlusion and stenosis of unspecified carotid artery: Secondary | ICD-10-CM

## 2014-04-30 DIAGNOSIS — I714 Abdominal aortic aneurysm, without rupture, unspecified: Secondary | ICD-10-CM

## 2014-04-30 DIAGNOSIS — I5022 Chronic systolic (congestive) heart failure: Secondary | ICD-10-CM

## 2014-04-30 DIAGNOSIS — Z95810 Presence of automatic (implantable) cardiac defibrillator: Secondary | ICD-10-CM

## 2014-04-30 DIAGNOSIS — I4891 Unspecified atrial fibrillation: Secondary | ICD-10-CM

## 2014-04-30 LAB — BASIC METABOLIC PANEL
BUN: 19 mg/dL (ref 6–23)
CALCIUM: 9 mg/dL (ref 8.4–10.5)
CO2: 26 meq/L (ref 19–32)
CREATININE: 1.3 mg/dL (ref 0.4–1.5)
Chloride: 109 mEq/L (ref 96–112)
GFR: 58.26 mL/min — ABNORMAL LOW (ref >60.00)
GLUCOSE: 92 mg/dL (ref 70–99)
Potassium: 3.7 mEq/L (ref 3.5–5.1)
Sodium: 144 mEq/L (ref 135–145)

## 2014-04-30 LAB — TSH: TSH: 0.82 u[IU]/mL (ref 0.35–4.50)

## 2014-04-30 MED ORDER — AMLODIPINE BESYLATE 10 MG PO TABS: 10.0000 mg | ORAL_TABLET | Freq: Every day | ORAL | Status: DC

## 2014-04-30 NOTE — Assessment & Plan Note (Signed)
Continue statin. Followed by vascular surgery. 

## 2014-04-30 NOTE — Assessment & Plan Note (Signed)
Followed by vascular surgery. 

## 2014-04-30 NOTE — Assessment & Plan Note (Signed)
Continue statin. Not on aspirin given need for anticoagulations. 

## 2014-04-30 NOTE — Assessment & Plan Note (Signed)
Continue statin. 

## 2014-04-30 NOTE — Assessment & Plan Note (Signed)
Followed by electrophysiology. 

## 2014-04-30 NOTE — Assessment & Plan Note (Signed)
Patient remains in sinus. Continue Toprol. Continue pradaxa. Check renal function. If GFR decreased may need to change to apixaban. Continue amiodarone. Check TSH and chest x-ray. Recent liver functions normal.

## 2014-04-30 NOTE — Assessment & Plan Note (Signed)
Continue ACE inhibitor and beta blocker. 

## 2014-04-30 NOTE — Assessment & Plan Note (Signed)
Euvolemic on examination.Continue present dose of Lasix. 

## 2014-04-30 NOTE — Progress Notes (Signed)
HPI: FU CAD. He has a history of coronary artery disease, status post coronary artery bypassing graft, history of ischemic cardiomyopathy, PAF as well as COPD. His last catheterization in August of 2013 showed normal left main, occluded LAD, a small OM 2 with an ostial 95% lesion, 2 moderate size obtuse marginals were occluded. The right coronary was occluded. The LIMA to the LAD was patent. The saphenous vein graft to the diagonal had a 50% proximal stenosis. The saphenous vein graft to the obtuse marginal was patent. Saphenous vein graft to the right coronary artery had a mid 40% lesion. Ejection fraction was 35%. Patient subsequently had a biventricular ICD implanted in September of 2013. He also has a history of focal aortic dissection being managed medically. Note, he also has a left iliac artery aneurysm and mild cerebrovascular disease with history of carotid endarterectomy. He is followed by vascular surgery (Dr. [name]) for these issues. Last echocardiogram in September 2014 showed an ejection fraction of 30-35%. The aortic root was mildly dilated. There was biatrial enlargement and mild mitral regurgitation. Last seen in Nov 2014. Seen in the emergency room in may of 2015 back pain. Abdominal ultrasound showed 3.2 cm aneurysm but was technically difficult and CTA recommended. Since last seen, He has some dyspnea on exertion but no orthopnea, pedal edema, chest pain or syncope.   Current Outpatient Prescriptions  Medication Sig Dispense Refill  . albuterol (PROVENTIL HFA;VENTOLIN HFA) 108 (90 BASE) MCG/ACT inhaler Inhale 2 puffs into the lungs every 4 (four) hours as needed for wheezing.  1 Inhaler  0  . amiodarone (PACERONE) 200 MG tablet Take 200 mg by mouth daily.      [name] amLODipine (NORVASC) 10 MG tablet Take 5 mg by mouth daily.      . dabigatran (PRADAXA) 150 MG CAPS capsule Take 1 capsule (150 mg total) by mouth 2 (two) times daily.  180 capsule  0  . furosemide (LASIX) 40 MG tablet  Take 1 tablet (40 mg total) by mouth daily.  30 tablet  0  . hydrALAZINE (APRESOLINE) 50 MG tablet Take 25 mg by mouth 2 (two) times daily.      [name] ipratropium-albuterol (DUONEB) 0.5-2.5 (3) MG/3ML SOLN Take 3 mLs by nebulization every 4 (four) hours as needed (for shortness of breath).      [name] lisinopril (PRINIVIL,ZESTRIL) 40 MG tablet Take 1 tablet (40 mg total) by mouth daily.  90 tablet  2  . metoprolol succinate (TOPROL-XL) 25 MG 24 hr tablet Take 12.5 mg by mouth daily.      . Multiple Vitamin (MULITIVITAMIN WITH MINERALS) TABS Take 1 tablet by mouth daily.      . niacin (NIASPAN) 500 MG CR tablet Take 1,000 mg by mouth 2 (two) times daily.       . potassium chloride SA (KLOR-CON M20) 20 MEQ tablet Take 2 tablets (40 mEq total) by mouth 3 (three) times daily.  540 tablet  3  . pravastatin (PRAVACHOL) 80 MG tablet Take 80 mg by mouth daily.       No current facility-administered medications for this visit.     Past Medical History  Diagnosis Date  . Atrial fibrillation     s/p prior DCCV;  Amiodarone Rx.  . CAD (coronary artery disease)     s/p CABG;  cath 10/07:  LM ok, LAD occluded, AV CFX 95%, pRCA occluded; L-LAD, S-OM2/OM3, S-PDA ok  . Cerebrovascular disease     s/p prior Left  CEA;  followed by Dr. Arbie CookeyEarly  . COPD (chronic obstructive pulmonary disease)   . Hyperlipidemia   . Hypertension   . Mitral insufficiency     hx of. not noted on echo 01/2012  . Nephrolithiasis   . Ischemic cardiomyopathy     echo 5/09: EF 55%, mild LAE;  Myoview 11/10: inf and apical scar with mild peri-infarct ischemia, EF 44%. Echo 01/26/12 with EF 20-25% but previously known to be 30% on echoes before last one  . Iliac artery aneurysm, left     followed by Dr. Arbie CookeyEarly  . Aortic dissection     H/O focal aortic dissection  . LBBB (left bundle branch block)   . CHF (congestive heart failure)   . Myocardial infarction   . Renal insufficiency     01/2012    Past Surgical History  Procedure Laterality  Date  . Uteroscopy    . Coronary artery bypass graft  1998  . Left cea  07/05  . Appendectomy  09/11/11  . Carotid endarterectomy  2005    left  . Biventricular defibrillator implantation  08/22/12    SJM Quadra Assura BiV ICD implanted by Dr Johney FrameAllred    History   Social History  . Marital Status: Married    Spouse Name: N/A    Number of Children: 3  . Years of Education: N/A   Occupational History  . RETIRED---truck driver    Social History Main Topics  . Smoking status: Former Smoker -- 1.50 packs/day for 57 years    Types: Cigarettes    Quit date: 12/11/2004  . Smokeless tobacco: Never Used  . Alcohol Use: No     Comment: 6 yrs ago   . Drug Use: No  . Sexual Activity: No   Other Topics Concern  . Not on file   Social History Narrative   [place].    Caffeine Use: Does drink coffee    ROS: no fevers or chills, productive cough, hemoptysis, dysphasia, odynophagia, melena, hematochezia, dysuria, hematuria, rash, seizure activity, orthopnea, PND, pedal edema, claudication. Remaining systems are negative.  Physical Exam: Well-developed well-nourished in no acute distress.  Skin is warm and dry.  HEENT is normal.  Neck is supple.  Chest With diminished breath sounds throughout Cardiovascular exam is regular rate and rhythm.  Abdominal exam nontender or distended. No masses palpated. Extremities show no edema. neuro grossly intact  ECG Sinus with ventricular pacing.

## 2014-04-30 NOTE — Assessment & Plan Note (Signed)
Blood pressure elevated. Increase amlodipine to 10 mg daily. Follow blood pressure at home.

## 2014-04-30 NOTE — Patient Instructions (Signed)
Your physician wants you to follow-up in: 6 MONTHS WITH DR [name] You will receive a reminder letter in the mail two months in advance. If you don't receive a letter, please call our office to schedule the follow-up appointment.   Your physician recommends that you HAVE LAB WORK TODAY  A chest x-ray takes a picture of the organs and structures inside the chest, including the heart, lungs, and blood vessels. This test can show several things, including, whether the heart is enlarges; whether fluid is building up in the lungs; and whether pacemaker / defibrillator leads are still in place. AT [facility]  INCREASE AMLODIPINE TO 10 MG ONCE DAILY  ELIQUIS OR XARELTO TO REPLACE PRADAXA

## 2014-04-30 NOTE — Assessment & Plan Note (Signed)
Patient complained of back pain recently that increased with movements. This is musculoskeletal in etiology. He had an abdominal ultrasound that was technically difficult but suggested 3.2 cm aneurysm. CT recommended but would be difficult given decreased renal function. He has an appointment with vascular surgery next month. He was encouraged to followup with them. I do not think back pain is related to his aortic aneurysm.

## 2014-05-07 ENCOUNTER — Other Ambulatory Visit: Payer: Self-pay | Admitting: Cardiology

## 2014-05-25 ENCOUNTER — Encounter: Payer: Self-pay | Admitting: Family

## 2014-05-26 ENCOUNTER — Other Ambulatory Visit (HOSPITAL_COMMUNITY): Payer: Medicare Other

## 2014-05-26 ENCOUNTER — Ambulatory Visit: Payer: Medicare Other | Admitting: Neurology

## 2014-05-26 ENCOUNTER — Ambulatory Visit: Payer: Medicare Other | Admitting: Family

## 2014-05-28 ENCOUNTER — Other Ambulatory Visit: Payer: Self-pay | Admitting: Cardiology

## 2014-06-01 ENCOUNTER — Ambulatory Visit (INDEPENDENT_AMBULATORY_CARE_PROVIDER_SITE_OTHER): Payer: Medicare Other | Admitting: *Deleted

## 2014-06-01 ENCOUNTER — Encounter: Payer: Self-pay | Admitting: Internal Medicine

## 2014-06-01 DIAGNOSIS — I2589 Other forms of chronic ischemic heart disease: Secondary | ICD-10-CM

## 2014-06-01 DIAGNOSIS — I5022 Chronic systolic (congestive) heart failure: Secondary | ICD-10-CM

## 2014-06-01 NOTE — Progress Notes (Signed)
Remote ICD transmission.   

## 2014-06-04 ENCOUNTER — Encounter (HOSPITAL_COMMUNITY): Payer: Self-pay | Admitting: Emergency Medicine

## 2014-06-04 ENCOUNTER — Emergency Department (HOSPITAL_COMMUNITY): Payer: Medicare Other

## 2014-06-04 ENCOUNTER — Emergency Department (HOSPITAL_COMMUNITY)
Admission: EM | Admit: 2014-06-04 | Discharge: 2014-06-04 | Disposition: A | Discharge status: Home / Self Care | Payer: Medicare Other | Attending: Emergency Medicine | Admitting: Emergency Medicine

## 2014-06-04 DIAGNOSIS — Y939 Activity, unspecified: Secondary | ICD-10-CM | POA: Insufficient documentation

## 2014-06-04 DIAGNOSIS — J449 Chronic obstructive pulmonary disease, unspecified: Secondary | ICD-10-CM | POA: Insufficient documentation

## 2014-06-04 DIAGNOSIS — Z951 Presence of aortocoronary bypass graft: Secondary | ICD-10-CM | POA: Insufficient documentation

## 2014-06-04 DIAGNOSIS — Z79899 Other long term (current) drug therapy: Secondary | ICD-10-CM | POA: Insufficient documentation

## 2014-06-04 DIAGNOSIS — Z791 Long term (current) use of non-steroidal anti-inflammatories (NSAID): Secondary | ICD-10-CM | POA: Insufficient documentation

## 2014-06-04 DIAGNOSIS — I252 Old myocardial infarction: Secondary | ICD-10-CM | POA: Insufficient documentation

## 2014-06-04 DIAGNOSIS — I4891 Unspecified atrial fibrillation: Secondary | ICD-10-CM | POA: Insufficient documentation

## 2014-06-04 DIAGNOSIS — IMO0002 Reserved for concepts with insufficient information to code with codable children: Secondary | ICD-10-CM | POA: Insufficient documentation

## 2014-06-04 DIAGNOSIS — I509 Heart failure, unspecified: Secondary | ICD-10-CM | POA: Insufficient documentation

## 2014-06-04 DIAGNOSIS — Z7902 Long term (current) use of antithrombotics/antiplatelets: Secondary | ICD-10-CM | POA: Insufficient documentation

## 2014-06-04 DIAGNOSIS — Z87448 Personal history of other diseases of urinary system: Secondary | ICD-10-CM | POA: Insufficient documentation

## 2014-06-04 DIAGNOSIS — J4489 Other specified chronic obstructive pulmonary disease: Secondary | ICD-10-CM | POA: Insufficient documentation

## 2014-06-04 DIAGNOSIS — E785 Hyperlipidemia, unspecified: Secondary | ICD-10-CM | POA: Insufficient documentation

## 2014-06-04 DIAGNOSIS — I2510 Atherosclerotic heart disease of native coronary artery without angina pectoris: Secondary | ICD-10-CM | POA: Insufficient documentation

## 2014-06-04 DIAGNOSIS — Z87891 Personal history of nicotine dependence: Secondary | ICD-10-CM | POA: Insufficient documentation

## 2014-06-04 DIAGNOSIS — Y929 Unspecified place or not applicable: Secondary | ICD-10-CM | POA: Insufficient documentation

## 2014-06-04 DIAGNOSIS — X58XXXA Exposure to other specified factors, initial encounter: Secondary | ICD-10-CM | POA: Insufficient documentation

## 2014-06-04 DIAGNOSIS — I10 Essential (primary) hypertension: Secondary | ICD-10-CM | POA: Insufficient documentation

## 2014-06-04 DIAGNOSIS — Z87442 Personal history of urinary calculi: Secondary | ICD-10-CM | POA: Insufficient documentation

## 2014-06-04 DIAGNOSIS — Z8673 Personal history of transient ischemic attack (TIA), and cerebral infarction without residual deficits: Secondary | ICD-10-CM | POA: Insufficient documentation

## 2014-06-04 LAB — URINALYSIS, ROUTINE W REFLEX MICROSCOPIC
BILIRUBIN URINE: NEGATIVE
Glucose, UA: NEGATIVE mg/dL
Hgb urine dipstick: NEGATIVE
Ketones, ur: NEGATIVE mg/dL
NITRITE: NEGATIVE
Protein, ur: NEGATIVE mg/dL
Specific Gravity, Urine: 1.017 (ref 1.005–1.030)
UROBILINOGEN UA: 1 mg/dL (ref 0.0–1.0)
pH: 8 (ref 5.0–8.0)

## 2014-06-04 LAB — URINE MICROSCOPIC-ADD ON

## 2014-06-04 LAB — BASIC METABOLIC PANEL
BUN: 15 mg/dL (ref 6–23)
CO2: 28 mEq/L (ref 19–32)
Calcium: 9 mg/dL (ref 8.4–10.5)
Chloride: 109 mEq/L (ref 96–112)
Creatinine, Ser: 1.05 mg/dL (ref 0.50–1.35)
GFR, EST AFRICAN AMERICAN: 80 mL/min — AB (ref >90)
GFR, EST NON AFRICAN AMERICAN: 69 mL/min — AB (ref >90)
Glucose, Bld: 102 mg/dL — ABNORMAL HIGH (ref 70–99)
Potassium: 3.4 mEq/L — ABNORMAL LOW (ref 3.7–5.3)
SODIUM: 150 meq/L — AB (ref 137–147)

## 2014-06-04 LAB — CBC WITH DIFFERENTIAL/PLATELET
BASOS PCT: 1 % (ref 0–1)
Basophils Absolute: 0 10*3/uL (ref 0.0–0.1)
EOS ABS: 0.2 10*3/uL (ref 0.0–0.7)
Eosinophils Relative: 3 % (ref 0–5)
HCT: 44.4 % (ref 39.0–52.0)
Hemoglobin: 14.4 g/dL (ref 13.0–17.0)
Lymphocytes Relative: 27 % (ref 12–46)
Lymphs Abs: 1.5 10*3/uL (ref 0.7–4.0)
MCH: 30.6 pg (ref 26.0–34.0)
MCHC: 32.4 g/dL (ref 30.0–36.0)
MCV: 94.3 fL (ref 78.0–100.0)
Monocytes Absolute: 0.4 10*3/uL (ref 0.1–1.0)
Monocytes Relative: 8 % (ref 3–12)
Neutro Abs: 3.3 10*3/uL (ref 1.7–7.7)
Neutrophils Relative %: 61 % (ref 43–77)
PLATELETS: 157 10*3/uL (ref 150–400)
RBC: 4.71 MIL/uL (ref 4.22–5.81)
RDW: 14.1 % (ref 11.5–15.5)
WBC: 5.4 10*3/uL (ref 4.0–10.5)

## 2014-06-04 MED ORDER — ONDANSETRON HCL 4 MG/2ML IJ SOLN
4.0000 mg | Freq: Once | INTRAMUSCULAR | Status: AC
  Administered 2014-06-04: 4 mg via INTRAVENOUS
  Filled 2014-06-04: qty 2

## 2014-06-04 MED ORDER — NITROFURANTOIN MONOHYD MACRO 100 MG PO CAPS: 100.0000 mg | ORAL_CAPSULE | Freq: Two times a day (BID) | ORAL | Status: DC

## 2014-06-04 MED ORDER — OXYCODONE HCL 5 MG PO TABS: 5.0000 mg | ORAL_TABLET | ORAL | Status: DC | PRN

## 2014-06-04 MED ORDER — MAGNESIUM CITRATE PO SOLN: 1.0000 | Freq: Once | ORAL | Status: DC

## 2014-06-04 MED ORDER — HYDROMORPHONE HCL PF 1 MG/ML IJ SOLN
1.0000 mg | INTRAMUSCULAR | Status: AC | PRN
  Administered 2014-06-04 (×2): 1 mg via INTRAVENOUS
  Filled 2014-06-04 (×2): qty 1

## 2014-06-04 MED ORDER — DOCUSATE SODIUM 100 MG PO CAPS: 100.0000 mg | ORAL_CAPSULE | Freq: Two times a day (BID) | ORAL | Status: DC

## 2014-06-04 NOTE — ED Notes (Signed)
Pt. Reports that the lower back pain has increased and he also has not had BM for 4-5 days.

## 2014-06-04 NOTE — ED Provider Notes (Signed)
CSN: [id number]     Arrival date & time 06/04/14  4098 History   First MD Initiated Contact with Patient 06/04/14 0935     Chief Complaint  Patient presents with  . Back Pain      HPI  [name] [name] presents here with back pain. A few weeks ago at home. Diagnosed with an L3 lumbar compression fracture. Saw Dr. Thurston Hole orthopedics.  Fitted with a TLSO brace. He states that does help somewhat. He has been out of pain medicine for the last several days. Has been constipated for a week.  No fevers chills nausea vomiting diarrhea. No numbness weakness tingling or extremity symptoms.  Patient states he has been up and around more than he was instructed to do so. His wife does require some help with him for her ADLs at home. They have agreed to the family that helps at home. Upon my initial evaluation, and prior to discharge him offer them care manager consult to discuss home health care nursing. They politely declined.  Past Medical History  Diagnosis Date  . Atrial fibrillation     s/p prior DCCV;  Amiodarone Rx.  . CAD (coronary artery disease)     s/p CABG;  cath 10/07:  LM ok, LAD occluded, AV CFX 95%, pRCA occluded; L-LAD, S-OM2/OM3, S-PDA ok  . Cerebrovascular disease     s/p prior Left CEA;  followed by Dr. [name]  . COPD (chronic obstructive pulmonary disease)   . Hyperlipidemia   . Hypertension   . Mitral insufficiency     hx of. not noted on echo 01/2012  . Nephrolithiasis   . Ischemic cardiomyopathy     echo 5/09: EF 55%, mild LAE;  Myoview 11/10: inf and apical scar with mild peri-infarct ischemia, EF 44%. Echo 01/26/12 with EF 20-25% but previously known to be 30% on echoes before last one  . Iliac artery aneurysm, left     followed by Dr. [name]  . Aortic dissection     H/O focal aortic dissection  . LBBB (left bundle branch block)   . CHF (congestive heart failure)   . Myocardial infarction   . Renal insufficiency     01/2012   Past Surgical History  Procedure Laterality Date  .  Uteroscopy    . Coronary artery bypass graft  1998  . Left cea  07/05  . Appendectomy  09/11/11  . Carotid endarterectomy  2005    left  . Biventricular defibrillator implantation  08/22/12    SJM Quadra Assura BiV ICD implanted by Dr [name]   Family History  Problem Relation Age of Onset  . Stroke Maternal Uncle    History  Substance Use Topics  . Smoking status: Former Smoker -- 1.50 packs/day for 57 years    Types: Cigarettes    Quit date: 12/11/2004  . Smokeless tobacco: Never Used  . Alcohol Use: No     Comment: 6 yrs ago     Review of Systems  Constitutional: Negative for fever, chills, diaphoresis, appetite change and fatigue.  HENT: Negative for mouth sores, sore throat and trouble swallowing.   Eyes: Negative for visual disturbance.  Respiratory: Negative for cough, chest tightness, shortness of breath and wheezing.   Cardiovascular: Negative for chest pain.  Gastrointestinal: Negative for nausea, vomiting, abdominal pain, diarrhea and abdominal distention.  Endocrine: Negative for polydipsia, polyphagia and polyuria.  Genitourinary: Negative for dysuria, frequency and hematuria.  Musculoskeletal: Positive for back pain. Negative for gait problem.  Skin: Negative for  color change, pallor and rash.  Neurological: Negative for dizziness, syncope, light-headedness and headaches.  Hematological: Does not bruise/bleed easily.  Psychiatric/Behavioral: Negative for behavioral problems and confusion.      Allergies  Review of patient's allergies indicates no known allergies.  Home Medications   Prior to Admission medications   Medication Sig Start Date End Date Taking? Authorizing Provider  albuterol (PROVENTIL HFA;VENTOLIN HFA) 108 (90 BASE) MCG/ACT inhaler Inhale 2 puffs into the lungs every 4 (four) hours as needed for wheezing. 03/29/13  Yes Suzi Roots, MD  amiodarone (PACERONE) 200 MG tablet Take 200 mg by mouth daily.   Yes Historical Provider, MD   amLODipine (NORVASC) 10 MG tablet Take 1 tablet (10 mg total) by mouth daily. 04/30/14  Yes Lewayne Bunting, MD  dabigatran (PRADAXA) 150 MG CAPS capsule Take 1 capsule (150 mg total) by mouth 2 (two) times daily. 03/09/14  Yes Lewayne Bunting, MD  furosemide (LASIX) 40 MG tablet Take 1 tablet (40 mg total) by mouth daily. 08/29/13  Yes Lewayne Bunting, MD  hydrALAZINE (APRESOLINE) 50 MG tablet Take 0.5 tablets (25 mg total) by mouth 2 (two) times daily.   Yes Lewayne Bunting, MD  ipratropium-albuterol (DUONEB) 0.5-2.5 (3) MG/3ML SOLN Take 3 mLs by nebulization every 4 (four) hours as needed (for shortness of breath).   Yes Historical Provider, MD  lisinopril (PRINIVIL,ZESTRIL) 40 MG tablet Take 40 mg by mouth daily.   Yes Historical Provider, MD  metoprolol succinate (TOPROL-XL) 25 MG 24 hr tablet Take 12.5 mg by mouth daily.   Yes Historical Provider, MD  Multiple Vitamin (MULITIVITAMIN WITH MINERALS) TABS Take 1 tablet by mouth daily.   Yes Historical Provider, MD  naproxen sodium (ALEVE) 220 MG tablet Take 440 mg by mouth 2 (two) times daily as needed (for pain).   Yes Historical Provider, MD  niacin (NIASPAN) 500 MG CR tablet Take 1,000 mg by mouth 2 (two) times daily.    Yes Historical Provider, MD  potassium chloride SA (KLOR-CON M20) 20 MEQ tablet Take 2 tablets (40 mEq total) by mouth 3 (three) times daily. 05/12/13  Yes Lewayne Bunting, MD  pravastatin (PRAVACHOL) 80 MG tablet Take 80 mg by mouth daily.   Yes Historical Provider, MD  docusate sodium (COLACE) 100 MG capsule Take 1 capsule (100 mg total) by mouth every 12 (twelve) hours. 06/04/14   Rolland Porter, MD  magnesium citrate SOLN Take 296 mLs (1 Bottle total) by mouth once. 06/04/14   Rolland Porter, MD  nitrofurantoin, macrocrystal-monohydrate, (MACROBID) 100 MG capsule Take 1 capsule (100 mg total) by mouth 2 (two) times daily. 06/04/14   Rolland Porter, MD  oxyCODONE (ROXICODONE) 5 MG immediate release tablet Take 1 tablet (5 mg total) by  mouth every 4 (four) hours as needed for severe pain. 06/04/14   Rolland Porter, MD   BP 138/70  Pulse 60  Temp(Src) 97.7 F (36.5 C) (Oral)  Resp 12  Ht 6\' 1"  (1.854 m)  Wt 254 lb (115.214 kg)  BMI 33.52 kg/m2  SpO2 93% Physical Exam  Constitutional: He is oriented to person, place, and time. He appears well-developed and well-nourished. No distress.  HENT:  Head: Normocephalic.  Eyes: Conjunctivae are normal. Pupils are equal, round, and reactive to light. No scleral icterus.  Neck: Normal range of motion. Neck supple. No thyromegaly present.  Cardiovascular: Normal rate and regular rhythm.  Exam reveals no gallop and no friction rub.   No murmur heard. Pulmonary/Chest: Effort  normal and breath sounds normal. No respiratory distress. He has no wheezes. He has no rales.  Abdominal: Soft. Bowel sounds are normal. He exhibits no distension. There is no tenderness. There is no rebound.  Abdomen the abdomen is soft and benign  Musculoskeletal: Normal range of motion.  Neurological: He is alert and oriented to person, place, and time.  Normal exam to lower extremities.  No numbness or weakness  Skin: Skin is warm and dry. No rash noted.  Psychiatric: He has a normal mood and affect. His behavior is normal.    ED Course  Procedures (including critical care time) Labs Review Labs Reviewed  BASIC METABOLIC PANEL - Abnormal; Notable for the following:    Sodium 150 (*)    Potassium 3.4 (*)    Glucose, Bld 102 (*)    GFR calc non Af Amer 69 (*)    GFR calc Af Amer 80 (*)    All other components within normal limits  URINALYSIS, ROUTINE W REFLEX MICROSCOPIC - Abnormal; Notable for the following:    APPearance CLOUDY (*)    Leukocytes, UA SMALL (*)    All other components within normal limits  URINE MICROSCOPIC-ADD ON - Abnormal; Notable for the following:    Squamous Epithelial / LPF FEW (*)    Bacteria, UA FEW (*)    All other components within normal limits  CBC WITH DIFFERENTIAL     Imaging Review Ct Lumbar Spine Wo Contrast  06/04/2014   CLINICAL DATA:  Back pain.  EXAM: CT LUMBAR SPINE WITHOUT CONTRAST  TECHNIQUE: Multidetector CT imaging of the lumbar spine was performed without intravenous contrast administration. Multiplanar CT image reconstructions were also generated.  COMPARISON:  CT scan of May 08, 2014.  FINDINGS: Diffuse osteopenia is noted. No significant spondylolisthesis is noted. Stable moderate compression deformity of L3 vertebral body is noted consistent with subacute to chronic fracture. Remaining vertebral bodies appear intact. No significant disc space narrowing is noted. Posterior facet joints appear normal. No retropulsion of fracture fragments into the spinal canal is noted. No definite evidence of significant disc herniation is noted. Minimal broad-based posterior disc bulging is noted at L3-4 and L4-5. Atherosclerotic calcifications and aneurysmal dilatation of abdominal aorta are again noted.  IMPRESSION: Stable subacute to chronic moderate compression fracture of L3 vertebral body is noted.  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 Followup ultrasound in 1 year is recommended to ensure stability.   Electronically Signed   By: Roque LiasJames  Green M.D.   On: 06/04/2014 11:29     EKG Interpretation None      MDM   Final diagnoses:  Compression fracture    CT shows no signs of instability or retropulsion of the L3 compression fracture. It does show UTI. Also noted is stable appearance of known abdominal aortic aneurysm. Plan is home. Percocet for pain. Treatment of UTI with Macrobid. Magnesium citrate for his current  tract infection. Colace as daily stool softener to prevent constipation while on narcotics. Where your brace as instructed by Dr. Thurston HoleWainer. Recheck with acute changes.    Rolland PorterMark James, MD 06/04/14 1209

## 2014-06-04 NOTE — Discharge Instructions (Signed)
Where your brace as instructed by Dr. Thurston Hole.  Colace as a stool softener to prevent constipation.  Mag citrate today and tomorrow for your constipation.  Macrobid is an antibiotic to treat your urinary tract infection

## 2014-06-04 NOTE — ED Notes (Signed)
Patient states he had hurt his back 4 weeks ago.  Patient states was diagnosed with fracture of L3 previously.  Patient states he has been having pain since.  Patient states he took aleve this morning, but no relief.   Patient states he was fitted for brace, but it doesn't help.   Patient states the pain medicine ran out.

## 2014-06-08 LAB — MDC_IDC_ENUM_SESS_TYPE_REMOTE
Battery Voltage: 2.98 V
Brady Statistic AP VP Percent: 81 %
Brady Statistic AP VS Percent: 2 %
Brady Statistic AS VP Percent: 14 %
Brady Statistic AS VS Percent: 1 %
Brady Statistic RA Percent Paced: 80 %
HighPow Impedance: 79 Ohm
HighPow Impedance: 79 Ohm
Lead Channel Impedance Value: 450 Ohm
Lead Channel Impedance Value: 480 Ohm
Lead Channel Pacing Threshold Amplitude: 0.75 V
Lead Channel Pacing Threshold Amplitude: 0.875 V
Lead Channel Pacing Threshold Pulse Width: 0.5 ms
Lead Channel Pacing Threshold Pulse Width: 0.5 ms
Lead Channel Sensing Intrinsic Amplitude: 12 mV
Lead Channel Setting Pacing Amplitude: 1.75 V
Lead Channel Setting Pacing Pulse Width: 0.5 ms
Lead Channel Setting Pacing Pulse Width: 0.5 ms
MDC IDC MSMT BATTERY REMAINING LONGEVITY: 64 mo
MDC IDC MSMT BATTERY REMAINING PERCENTAGE: 75 %
MDC IDC MSMT LEADCHNL RA IMPEDANCE VALUE: 430 Ohm
MDC IDC MSMT LEADCHNL RA PACING THRESHOLD AMPLITUDE: 0.75 V
MDC IDC MSMT LEADCHNL RA PACING THRESHOLD PULSEWIDTH: 0.5 ms
MDC IDC MSMT LEADCHNL RA SENSING INTR AMPL: 4.9 mV
MDC IDC PG MODEL: 3265
MDC IDC PG SERIAL: 1075181
MDC IDC SESS DTM: 20150622060021
MDC IDC SET LEADCHNL LV PACING AMPLITUDE: 2 V
MDC IDC SET LEADCHNL RV PACING AMPLITUDE: 2 V
MDC IDC SET LEADCHNL RV SENSING SENSITIVITY: 0.5 mV
MDC IDC SET ZONE DETECTION INTERVAL: 280 ms
Zone Setting Detection Interval: 340 ms

## 2014-06-09 ENCOUNTER — Telehealth (HOSPITAL_COMMUNITY): Payer: Self-pay | Admitting: Interventional Radiology

## 2014-06-09 ENCOUNTER — Other Ambulatory Visit (HOSPITAL_COMMUNITY): Payer: Self-pay | Admitting: Sports Medicine

## 2014-06-09 DIAGNOSIS — S32039A Unspecified fracture of third lumbar vertebra, initial encounter for closed fracture: Secondary | ICD-10-CM

## 2014-06-09 DIAGNOSIS — M545 Low back pain: Secondary | ICD-10-CM

## 2014-06-09 NOTE — Telephone Encounter (Signed)
Called pt, left VM for him to call to schedule new pt consult for L3 compression fracture. JM

## 2014-06-10 ENCOUNTER — Encounter: Payer: Self-pay | Admitting: Cardiology

## 2014-06-16 ENCOUNTER — Telehealth: Payer: Self-pay | Admitting: *Deleted

## 2014-06-16 NOTE — Telephone Encounter (Signed)
Spoke with pt, he is not able to afford the pradaxa anymore. He would like to switch back to the warfarin. Per Riki Rusk, pharm md, pt will overlap pradaxa and warfarin for 3 days. He will take warfarin 7.5 mg today with pradaxa. 5 mg of warfarin tomorrow and Thursday with pradaxa. Friday he will stop the pradaxa and cont warfarin 5 mg daily. appt will be made for [facility] clinic Monday.

## 2014-06-16 NOTE — Telephone Encounter (Signed)
Spoke with pt dtr, she has a call into medicaid to see if they will be able to help the pt while he is in the donut hole. They would like for him to stay on the pradaxa if able. They are going to call back and let me know.

## 2014-06-17 ENCOUNTER — Telehealth (HOSPITAL_COMMUNITY): Payer: Self-pay | Admitting: Interventional Radiology

## 2014-06-17 NOTE — Telephone Encounter (Signed)
Called pt again to see if he wanted to schedule his consult for possible KP. Pt states that he does not wish to have this treated. I called Darl Pikes at Dr. Greig Right office and told her that pt opted out of treatment. JM

## 2014-06-22 ENCOUNTER — Other Ambulatory Visit: Payer: Self-pay | Admitting: Cardiology

## 2014-07-24 ENCOUNTER — Other Ambulatory Visit: Payer: Self-pay

## 2014-07-24 MED ORDER — PRAVASTATIN SODIUM 80 MG PO TABS: 80.0000 mg | ORAL_TABLET | Freq: Every day | ORAL | Status: DC

## 2014-08-24 ENCOUNTER — Other Ambulatory Visit: Payer: Self-pay | Admitting: Cardiology

## 2014-09-03 ENCOUNTER — Telehealth (HOSPITAL_COMMUNITY): Payer: Self-pay | Admitting: *Deleted

## 2014-09-03 ENCOUNTER — Telehealth: Payer: Self-pay | Admitting: Cardiology

## 2014-09-03 NOTE — Telephone Encounter (Signed)
Close encounter 

## 2014-09-03 NOTE — Telephone Encounter (Signed)
Pts daughter called wanting to know if Dr. [name] could order for him to have his carotids checked. He is having some issues and doesn't know if he needed to be referred back to the surgeon who did the stent.  Please advise

## 2014-09-03 NOTE — Telephone Encounter (Signed)
Spoke with pt, yesterday he was having pain in his neck with movement, no other symptoms. Explained I did not think it was related to his carotid sten. His last dopplers were 05-2013, he is past due to have repeated. Pt has been dealing with the sickness of his wife and her passing. He will give [facility] a call to see about getting dopplers scheduled.

## 2014-09-18 ENCOUNTER — Encounter: Payer: Self-pay | Admitting: *Deleted

## 2014-09-27 HISTORY — PX: POSTERIOR FUSION LUMBAR SPINE: SUR632

## 2014-09-29 ENCOUNTER — Other Ambulatory Visit (HOSPITAL_COMMUNITY): Payer: Medicare Other

## 2014-09-29 ENCOUNTER — Ambulatory Visit: Payer: Medicare Other | Admitting: Family

## 2014-10-01 ENCOUNTER — Telehealth: Payer: Self-pay | Admitting: Cardiology

## 2014-10-01 NOTE — Telephone Encounter (Signed)
LMOVM for pt to return call 

## 2014-10-02 NOTE — Telephone Encounter (Signed)
Called pt and let him know of appt scheduled for 11-02-2014 at 12:45 PM. He agreed to appt.

## 2014-10-06 ENCOUNTER — Ambulatory Visit (INDEPENDENT_AMBULATORY_CARE_PROVIDER_SITE_OTHER): Payer: Medicare Other | Admitting: Emergency Medicine

## 2014-10-06 ENCOUNTER — Encounter: Payer: Self-pay | Admitting: Emergency Medicine

## 2014-10-06 VITALS — BP 130/82 | HR 63 | Temp 97.0°F | Ht 73.0 in | Wt 253.6 lb

## 2014-10-06 DIAGNOSIS — J449 Chronic obstructive pulmonary disease, unspecified: Secondary | ICD-10-CM

## 2014-10-06 DIAGNOSIS — Z23 Encounter for immunization: Secondary | ICD-10-CM

## 2014-10-06 DIAGNOSIS — I2589 Other forms of chronic ischemic heart disease: Secondary | ICD-10-CM

## 2014-10-06 NOTE — Progress Notes (Signed)
  Subjective:    Patient ID: [name] [name], male    DOB: May 07, 1941, 73 y.o.   MRN: [id number] HPI 73 yo man, former tobacco (80+ pk-yrs), hx of CAD, HTN, A Fib, carotid dz. Was hospitalized 9-09/2011 for appendectomy, and during that hosp was found to have hypoxemia. Pulm consulted and he was suspected to have COPD + restriction due to abd compliance. He is not quite back up to usual functional capacity yet. He describes exertional SOB after walking 200 ft. No real cough. On coumadin for A Fib. He has had PFT at [facility] several yrs ago. Not on BD's at this time.   ROV 11/14/11 -- Hx Tobacco use, dyspnea, returns to eval SOB following his PFT today. Shows mild AFL, no BD response, normal volumes. He has been dieting, has lost about 20 lbs since hospitalization. His breathing is better - still with some limitations but able to do more.   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 Follows at [facility] for CAD, HTN, A Fib. Since last time he has had a pacer placed, followed by Dr [name]. He had some SOB that improved when the pacer was placed. He is not on scheduled BD's at this time. He sometimes has wheezing, no associated CP or tightness. He used SABA a few times but never really helped him. He has put back on about 15 lbs.   ROV 02/10/14 -- hx COPD, mild AFL. Also with CAD, HTN, A Fib s/p pacer. He returns today and reports that he was treated for an AE and CAP about 1 month ago. He had wheezing, coughing, non-productive. He was treated with corticosteroids and abx. He now feels better. He hears some wheeze. He uses SABA rarely, more often in the Summer. He is on DuoNebs bid that he started 3-4 weeks ago. He believes that it is helping him.   ROV 10/06/14 -- hx COPD, mild AFL. Also a hx A Fib, pacemaker. He has been doing very well.  Has not needed his SABA. He notes that his breathing improved significantly since his pacemaker. He hasn't been using any of his inhaled meds, feels quite  well.       Objective:   Physical Exam Filed Vitals:   10/06/14 1437  BP: 130/82  Pulse: 63  Temp: 97 F (36.1 C)  TempSrc: Oral  Height: 6\' 1"  (1.854 m)  Weight: 253 lb 9.6 oz (115.032 kg)  SpO2: 96%   Gen: Pleasant, obese, in no distress,  normal affect  ENT: No lesions,  mouth clear,  oropharynx clear, no postnasal drip  Neck: No JVD, no TMG, no carotid bruits  Lungs: No use of accessory muscles, no dullness to percussion, mild low-pitched wheeze on forced exp  Cardiovascular: RRR, heart sounds normal, no murmur or gallops, no peripheral edema  Musculoskeletal: No deformities, no cyanosis or clubbing  Neuro: alert, non focal  Skin: Warm, no lesions or rashes     Assessment & Plan:  COPD (chronic obstructive pulmonary disease) Please continue to have your albuterol available to use as needed Get your flu shot today. Follow with Dr Delton Coombes in 12 months or sooner if you have any problems

## 2014-10-06 NOTE — Patient Instructions (Signed)
Please continue to have your albuterol available to use as needed Get your flu shot today. Follow with Dr Byrum in 12 months or sooner if you have any problems 

## 2014-10-06 NOTE — Assessment & Plan Note (Signed)
Please continue to have your albuterol available to use as needed Get your flu shot today. Follow with Dr Delton Coombes in 12 months or sooner if you have any problems

## 2014-10-20 ENCOUNTER — Encounter: Payer: Self-pay | Admitting: Family

## 2014-10-21 ENCOUNTER — Ambulatory Visit (HOSPITAL_COMMUNITY)
Admission: RE | Admit: 2014-10-21 | Discharge: 2014-10-21 | Disposition: A | Discharge status: Home / Self Care | Payer: Medicare Other | Source: Ambulatory Visit | Attending: Family | Admitting: Family

## 2014-10-21 ENCOUNTER — Encounter: Payer: Self-pay | Admitting: Family

## 2014-10-21 ENCOUNTER — Ambulatory Visit (INDEPENDENT_AMBULATORY_CARE_PROVIDER_SITE_OTHER)
Admission: RE | Admit: 2014-10-21 | Discharge: 2014-10-21 | Disposition: A | Discharge status: Home / Self Care | Payer: Medicare Other | Source: Ambulatory Visit | Attending: Vascular Surgery | Admitting: Vascular Surgery

## 2014-10-21 ENCOUNTER — Ambulatory Visit (INDEPENDENT_AMBULATORY_CARE_PROVIDER_SITE_OTHER): Payer: Medicare Other | Admitting: Family

## 2014-10-21 VITALS — BP 153/101 | HR 66 | Resp 16 | Ht 73.0 in | Wt 246.0 lb

## 2014-10-21 DIAGNOSIS — I6523 Occlusion and stenosis of bilateral carotid arteries: Secondary | ICD-10-CM

## 2014-10-21 DIAGNOSIS — I2589 Other forms of chronic ischemic heart disease: Secondary | ICD-10-CM

## 2014-10-21 DIAGNOSIS — I714 Abdominal aortic aneurysm, without rupture, unspecified: Secondary | ICD-10-CM

## 2014-10-21 DIAGNOSIS — Z48812 Encounter for surgical aftercare following surgery on the circulatory system: Secondary | ICD-10-CM

## 2014-10-21 NOTE — Progress Notes (Signed)
[facility] [place] HISTORY AND PHYSICAL   MRN : [id number]  History of Present Illness:   [name] [name] is a 73 y.o. male patient of Dr. [name] who is status post left CEA in 2006. Patient also has a moderate AAA that is being followed with serial duplex. He returns today for follow up.  He had L1-4 back surgery for fractures in stages, last surgery was October, 2015, Dr. Clinton Sawyer; pt denies any known injury; he feels much improved from this. The patient denies abdominal pain.  Pt states he was started on Pradaxa after he had the TIA in about 2011 as manifested by dizziness, blurry vision, slurred speech, and possible tingling in his right side, all symptoms resolved, he has had no further TIA's or stroke symptoms since then. He also has a history of atrial fib.  He denies claudication symptoms with walking, denies non healing wounds.    Pt Diabetic: No Pt smoker: former smoker, quit about 2006  Pt meds include: Statin :Yes Betablocker: Yes ASA: No Other anticoagulants/antiplatelets: Pradaxa for atrial fib  Current Outpatient Prescriptions  Medication Sig Dispense Refill  . albuterol (PROVENTIL HFA;VENTOLIN HFA) 108 (90 BASE) MCG/ACT inhaler Inhale 2 puffs into the lungs every 4 (four) hours as needed for wheezing. 1 Inhaler 0  . amiodarone (PACERONE) 200 MG tablet TAKE 1 TABLET EVERY DAY 90 tablet 2  . amLODipine (NORVASC) 10 MG tablet Take 1 tablet (10 mg total) by mouth daily. 90 tablet 4  . dabigatran (PRADAXA) 150 MG CAPS capsule Take 1 capsule (150 mg total) by mouth 2 (two) times daily. 180 capsule 0  . docusate sodium (COLACE) 100 MG capsule Take 1 capsule (100 mg total) by mouth every 12 (twelve) hours. 60 capsule 0  . furosemide (LASIX) 40 MG tablet Take 40 mg by mouth daily as needed.    . hydrALAZINE (APRESOLINE) 50 MG tablet Take 0.5 tablets (25 mg total) by mouth 2 (two) times daily. 90 tablet 1  . ipratropium-albuterol (DUONEB) 0.5-2.5  (3) MG/3ML SOLN Take 3 mLs by nebulization every 4 (four) hours as needed (for shortness of breath).    [name] lisinopril (PRINIVIL,ZESTRIL) 40 MG tablet Take 40 mg by mouth daily.    . magnesium citrate SOLN Take 296 mLs (1 Bottle total) by mouth once. 195 mL 0  . metoprolol succinate (TOPROL-XL) 25 MG 24 hr tablet Take 12.5 mg by mouth daily.    . Multiple Vitamin (MULITIVITAMIN WITH MINERALS) TABS Take 1 tablet by mouth daily.    . naproxen sodium (ALEVE) 220 MG tablet Take 440 mg by mouth 2 (two) times daily as needed (for pain).    . niacin (NIASPAN) 500 MG CR tablet Take 1,000 mg by mouth 2 (two) times daily.     . potassium chloride SA (K-DUR,KLOR-CON) 20 MEQ tablet TAKE 2 TABLETS THREE TIMES DAILY 540 tablet 0  . pravastatin (PRAVACHOL) 80 MG tablet Take 1 tablet (80 mg total) by mouth daily. 90 tablet 2   No current facility-administered medications for this visit.    Past Medical History  Diagnosis Date  . Atrial fibrillation     s/p prior DCCV;  Amiodarone Rx.  . CAD (coronary artery disease)     s/p CABG;  cath 10/07:  LM ok, LAD occluded, AV CFX 95%, pRCA occluded; L-LAD, S-OM2/OM3, S-PDA ok  . Cerebrovascular disease     s/p prior Left CEA;  followed by Dr. [name]  . COPD (chronic obstructive pulmonary disease)   .  Hyperlipidemia   . Hypertension   . Mitral insufficiency     hx of. not noted on echo 01/2012  . Nephrolithiasis   . Ischemic cardiomyopathy     echo 5/09: EF 55%, mild LAE;  Myoview 11/10: inf and apical scar with mild peri-infarct ischemia, EF 44%. Echo 01/26/12 with EF 20-25% but previously known to be 30% on echoes before last one  . Iliac artery aneurysm, left     followed by Dr. [name]  . Aortic dissection     H/O focal aortic dissection  . LBBB (left bundle branch block)   . CHF (congestive heart failure)   . Myocardial infarction   . Renal insufficiency     01/2012  . Stroke     Social History History  Substance Use Topics  . Smoking status: Former  Smoker -- 1.50 packs/day for 57 years    Types: Cigarettes    Quit date: 12/11/2004  . Smokeless tobacco: Never Used  . Alcohol Use: No     Comment: 6 yrs ago     Family History Family History  Problem Relation Age of Onset  . Stroke Maternal Uncle     Surgical History Past Surgical History  Procedure Laterality Date  . Uteroscopy    . Coronary artery bypass graft  1998  . Left cea  07/05  . Appendectomy  09/11/11  . Carotid endarterectomy  2005    left  . Biventricular defibrillator implantation  08/22/12    [facility] Quadra Assura BiV ICD implanted by Dr [name]  . Spine surgery  Sep 27, 2014    Lower Back L 4  and   L 5    No Known Allergies  Current Outpatient Prescriptions  Medication Sig Dispense Refill  . albuterol (PROVENTIL HFA;VENTOLIN HFA) 108 (90 BASE) MCG/ACT inhaler Inhale 2 puffs into the lungs every 4 (four) hours as needed for wheezing. 1 Inhaler 0  . amiodarone (PACERONE) 200 MG tablet TAKE 1 TABLET EVERY DAY 90 tablet 2  . amLODipine (NORVASC) 10 MG tablet Take 1 tablet (10 mg total) by mouth daily. 90 tablet 4  . dabigatran (PRADAXA) 150 MG CAPS capsule Take 1 capsule (150 mg total) by mouth 2 (two) times daily. 180 capsule 0  . docusate sodium (COLACE) 100 MG capsule Take 1 capsule (100 mg total) by mouth every 12 (twelve) hours. 60 capsule 0  . furosemide (LASIX) 40 MG tablet Take 40 mg by mouth daily as needed.    . hydrALAZINE (APRESOLINE) 50 MG tablet Take 0.5 tablets (25 mg total) by mouth 2 (two) times daily. 90 tablet 1  . ipratropium-albuterol (DUONEB) 0.5-2.5 (3) MG/3ML SOLN Take 3 mLs by nebulization every 4 (four) hours as needed (for shortness of breath).    [name] lisinopril (PRINIVIL,ZESTRIL) 40 MG tablet Take 40 mg by mouth daily.    . magnesium citrate SOLN Take 296 mLs (1 Bottle total) by mouth once. 195 mL 0  . metoprolol succinate (TOPROL-XL) 25 MG 24 hr tablet Take 12.5 mg by mouth daily.    . Multiple Vitamin (MULITIVITAMIN WITH MINERALS) TABS  Take 1 tablet by mouth daily.    . naproxen sodium (ALEVE) 220 MG tablet Take 440 mg by mouth 2 (two) times daily as needed (for pain).    . niacin (NIASPAN) 500 MG CR tablet Take 1,000 mg by mouth 2 (two) times daily.     . potassium chloride SA (K-DUR,KLOR-CON) 20 MEQ tablet TAKE 2 TABLETS THREE TIMES DAILY 540 tablet  0  . pravastatin (PRAVACHOL) 80 MG tablet Take 1 tablet (80 mg total) by mouth daily. 90 tablet 2   No current facility-administered medications for this visit.     REVIEW OF SYSTEMS: See HPI for pertinent positives and negatives.  Physical Examination Filed Vitals:   10/21/14 1106 10/21/14 1109  BP: 152/81 153/101  Pulse: 69 66  Resp:  16  Height:  6\' 1"  (1.854 m)  Weight:  246 lb (111.585 kg)  SpO2:  96%   Body mass index is 32.46 kg/(m^2).  General:  WDWN in NAD, obese male Gait: Normal HENT: WNL Eyes: Pupils equal Pulmonary: normal non-labored breathing, without Rales, rhonchi,  wheezing Cardiac: RRR, no murmur detected  Abdomen: soft, NT, no masses palpated Skin: no rashes, ulcers noted;  no Gangrene , no cellulitis; no open wounds.   VASCULAR EXAM  Carotid Bruits Right Left   Negative Negative    Bilateral radial pulses are 2+ palpable Aorta is not palpable                         VASCULAR EXAM: Extremities without ischemic changes  without Gangrene; without open wounds;.                                                                                                          LE Pulses Right Left       FEMORAL  not palpable  not palpable        POPLITEAL  not palpable   not palpable       POSTERIOR TIBIAL  1+ palpable   not palpable        DORSALIS PEDIS      ANTERIOR TIBIAL 2+ palpable  2+ palpable      Musculoskeletal: no muscle wasting or atrophy; no peripheral edema  Neurologic: A&O X 3; Appropriate Affect ;  SENSATION: normal; MOTOR FUNCTION: 5/5 Symmetric, CN 2-12 intact Speech is fluent/normal   Non-Invasive Vascular  Imaging (10/21/2014):  CEREBROVASCULAR DUPLEX EVALUATION    INDICATION: Carotid artery stenosis    PREVIOUS INTERVENTION(S): Left carotid endarterectomy     DUPLEX EXAM:     RIGHT  LEFT  Peak Systolic Velocities (cm/s) End Diastolic Velocities (cm/s) Plaque LOCATION Peak Systolic Velocities (cm/s) End Diastolic Velocities (cm/s) Plaque  63 11  CCA PROXIMAL 70 12   41 6  CCA MID 55 9 HT  31 8 HT CCA DISTAL 28 7   80 15 HT ECA 60 9   37 13 HT ICA PROXIMAL 27 9   75 26  ICA MID 69 17   74 22  ICA DISTAL 52 15     0.90 ICA / CCA Ratio (PSV) Carotid endarterectomy  Antegrade Vertebral Flow Antegrade  152 Brachial Systolic Pressure (mmHg) 156  Triphasic Brachial Artery Waveforms Triphasic    Plaque Morphology:  HM = Homogeneous, HT = Heterogeneous, CP = Calcific Plaque, SP = Smooth Plaque, IP = Irregular Plaque     ADDITIONAL FINDINGS:     IMPRESSION: Right internal carotid  artery stenosis present of less than 40%. Left internal carotid artery is patent with history of carotid endarterectomy, no hyperplasia or hemodynamically significant stenosis present.    Compared to the previous exam:  Unchanged since previous study on 05/21/2013.      ABDOMINAL AORTA DUPLEX EVALUATION    INDICATION: Abdominal aortic aneurysm    PREVIOUS INTERVENTION(S): NA    DUPLEX EXAM:     LOCATION DIAMETER AP (cm) DIAMETER TRANSVERSE (cm) VELOCITIES (cm/sec)  Aorta Proximal 3.09 3.23 63  Aorta Mid 2.47 2.73 50  Aorta Distal 4.72 4.72 53  Right Common Iliac Artery 1.38 1.46 88  Left Common Iliac Artery 1.07 1.40 95    Previous max aortic diameter:  3.51cm x 3.35cm  4.3cm x 3.9cm Date: 05/21/2013 05/30/2012     ADDITIONAL FINDINGS:     IMPRESSION: Technically difficult study due to patient body habitus and excessive bowel gas. Abdominal aortic aneurysm present with non-occluding intramural thrombus present measuring approximately 4.72cm x 4.72cm.    Compared to the previous exam:   Increase in diameter since previous studies on 05/21/2013 and 05/30/2012.      ASSESSMENT:  [name] [name] is a 73 y.o. male who is status post left CEA in 2006. Patient also has a moderate AAA that is being followed with serial duplex.  He has had no TIA nor stroke symptoms since 2011. Carotid Duplex today reveals  less than 40% right ICA stenosis and patent left ICA  with history of carotid endarterectomy, no hyperplasia or hemodynamically significant stenosis present. Unchanged since previous study on 05/21/2013.  AAA Duplex today reveals a tcechnically difficult study due to patient body habitus and excessive bowel gas. Abdominal aortic aneurysm present with non-occluding intramural thrombus present measuring approximately 4.72cm x 4.72cm. Increase in diameter since previous studies on 05/21/2013 and 05/30/2012. See Plan.   PLAN:   Based on today's exam and non-invasive vascular lab results, the patient will follow up in 6 months with the following tests: AAA Duplex, and in 1 year for carotid Duplex. I discussed in depth with the patient the nature of atherosclerosis, and emphasized the importance of maximal medical management including strict control of blood pressure, blood glucose, and lipid levels, obtaining regular exercise, and cessation of smoking.  The patient is aware that without maximal medical management the underlying atherosclerotic disease process will progress, limiting the benefit of any interventions. Consideration for repair of AAA would be made when the size approaches 4.8 or 5.0 cm, growth > 1 cm/yr, and symptomatic status. The patient was given information about stroke prevention and what symptoms should prompt the patient to seek immediate medical care. The patient was given information about AAA including signs, symptoms, treatment,  what symptoms should prompt the patient to seek immediate medical care, and how to minimize the risk of enlargement and rupture of  aneurysms.  Thank you for allowing [place] to participate in this patient's care.  Charisse March, RN, MSN, FNP-C [facility] Office: [phone]  Clinic MD: Hart Rochester on call 10/21/2014 11:13 AM

## 2014-10-21 NOTE — Patient Instructions (Signed)
Stroke Prevention Some medical conditions and behaviors are associated with an increased chance of having a stroke. You may prevent a stroke by making healthy choices and managing medical conditions. HOW CAN I REDUCE MY RISK OF HAVING A STROKE?   Stay physically active. Get at least 30 minutes of activity on most or all days.  Do not smoke. It may also be helpful to avoid exposure to secondhand smoke.  Limit alcohol use. Moderate alcohol use is considered to be:  No more than 2 drinks per day for men.  No more than 1 drink per day for nonpregnant women.  Eat healthy foods. This involves:  Eating 5 or more servings of fruits and vegetables a day.  Making dietary changes that address high blood pressure (hypertension), high cholesterol, diabetes, or obesity.  Manage your cholesterol levels.  Making food choices that are high in fiber and low in saturated fat, trans fat, and cholesterol may control cholesterol levels.  Take any prescribed medicines to control cholesterol as directed by your health care provider.  Manage your diabetes.  Controlling your carbohydrate and sugar intake is recommended to manage diabetes.  Take any prescribed medicines to control diabetes as directed by your health care provider.  Control your hypertension.  Making food choices that are low in salt (sodium), saturated fat, trans fat, and cholesterol is recommended to manage hypertension.  Take any prescribed medicines to control hypertension as directed by your health care provider.  Maintain a healthy weight.  Reducing calorie intake and making food choices that are low in sodium, saturated fat, trans fat, and cholesterol are recommended to manage weight.  Stop drug abuse.  Avoid taking birth control pills.  Talk to your health care provider about the risks of taking birth control pills if you are over 35 years old, smoke, get migraines, or have ever had a blood clot.  Get evaluated for sleep  disorders (sleep apnea).  Talk to your health care provider about getting a sleep evaluation if you snore a lot or have excessive sleepiness.  Take medicines only as directed by your health care provider.  For some people, aspirin or blood thinners (anticoagulants) are helpful in reducing the risk of forming abnormal blood clots that can lead to stroke. If you have the irregular heart rhythm of atrial fibrillation, you should be on a blood thinner unless there is a good reason you cannot take them.  Understand all your medicine instructions.  Make sure that other conditions (such as anemia or atherosclerosis) are addressed. SEEK IMMEDIATE MEDICAL CARE IF:   You have sudden weakness or numbness of the face, arm, or leg, especially on one side of the body.  Your face or eyelid droops to one side.  You have sudden confusion.  You have trouble speaking (aphasia) or understanding.  You have sudden trouble seeing in one or both eyes.  You have sudden trouble walking.  You have dizziness.  You have a loss of balance or coordination.  You have a sudden, severe headache with no known cause.  You have new chest pain or an irregular heartbeat. Any of these symptoms may represent a serious problem that is an emergency. Do not wait to see if the symptoms will go away. Get medical help at once. Call your local emergency services (911 in [place] Do not drive yourself to the hospital. Document Released: 01/04/2005 Document Revised: 04/13/2014 Document Reviewed: 05/30/2013 ExitCare Patient Information 2015 [employer]. This information is not intended to replace advice given   to you by your health care provider. Make sure you discuss any questions you have with your health care provider.    Abdominal Aortic Aneurysm An aneurysm is a weakened or damaged part of an artery wall that bulges from the normal force of blood pumping through the body. An abdominal aortic aneurysm is an aneurysm that  occurs in the lower part of the aorta, the main artery of the body.  The major concern with an abdominal aortic aneurysm is that it can enlarge and burst (rupture) or blood can flow between the layers of the wall of the aorta through a tear (aorticdissection). Both of these conditions can cause bleeding inside the body and can be life threatening unless diagnosed and treated promptly. CAUSES  The exact cause of an abdominal aortic aneurysm is unknown. Some contributing factors are:   A hardening of the arteries caused by the buildup of fat and other substances in the lining of a blood vessel (arteriosclerosis).  Inflammation of the walls of an artery (arteritis).   Connective tissue diseases, such as Marfan syndrome.   Abdominal trauma.   An infection, such as syphilis or staphylococcus, in the wall of the aorta (infectious aortitis) caused by bacteria. RISK FACTORS  Risk factors that contribute to an abdominal aortic aneurysm may include:  Age older than 60 years.   High blood pressure (hypertension).  Male gender.  Ethnicity (white race).  Obesity.  Family history of aneurysm (first degree relatives only).  Tobacco use. PREVENTION  The following healthy lifestyle habits may help decrease your risk of abdominal aortic aneurysm:  Quitting smoking. Smoking can raise your blood pressure and cause arteriosclerosis.  Limiting or avoiding alcohol.  Keeping your blood pressure, blood sugar level, and cholesterol levels within normal limits.  Decreasing your salt intake. In somepeople, too much salt can raise blood pressure and increase your risk of abdominal aortic aneurysm.  Eating a diet low in saturated fats and cholesterol.  Increasing your fiber intake by including whole grains, vegetables, and fruits in your diet. Eating these foods may help lower blood pressure.  Maintaining a healthy weight.  Staying physically active and exercising regularly. SYMPTOMS  The  symptoms of abdominal aortic aneurysm may vary depending on the size and rate of growth of the aneurysm.Most grow slowly and do not have any symptoms. When symptoms do occur, they may include:  Pain (abdomen, side, lower back, or groin). The pain may vary in intensity. A sudden onset of severe pain may indicate that the aneurysm has ruptured.  Feeling full after eating only small amounts of food.  Nausea or vomiting or both.  Feeling a pulsating lump in the abdomen.  Feeling faint or passing out. DIAGNOSIS  Since most unruptured abdominal aortic aneurysms have no symptoms, they are often discovered during diagnostic exams for other conditions. An aneurysm may be found during the following procedures:  Ultrasonography (A one-time screening for abdominal aortic aneurysm by ultrasonography is also recommended for all men aged 65-75 years who have ever smoked).  X-ray exams.  A computed tomography (CT).  Magnetic resonance imaging (MRI).  Angiography or arteriography. TREATMENT  Treatment of an abdominal aortic aneurysm depends on the size of your aneurysm, your age, and risk factors for rupture. Medication to control blood pressure and pain may be used to manage aneurysms smaller than 6 cm. Regular monitoring for enlargement may be recommended by your caregiver if:  The aneurysm is 3-4 cm in size (an annual ultrasonography may be recommended).    The aneurysm is 4-4.5 cm in size (an ultrasonography every 6 months may be recommended).  The aneurysm is larger than 4.5 cm in size (your caregiver may ask that you be examined by a vascular surgeon). If your aneurysm is larger than 6 cm, surgical repair may be recommended. There are two main methods for repair of an aneurysm:   Endovascular repair (a minimally invasive surgery). This is done most often.  Open repair. This method is used if an endovascular repair is not possible. Document Released: 09/06/2005 Document Revised: 03/24/2013  Document Reviewed: 12/27/2012 ExitCare Patient Information 2015 [employer]. This information is not intended to replace advice given to you by your health care provider. Make sure you discuss any questions you have with your health care provider.  

## 2014-10-23 ENCOUNTER — Telehealth: Payer: Self-pay | Admitting: Cardiology

## 2014-10-23 MED ORDER — WARFARIN SODIUM 5 MG PO TABS
ORAL_TABLET | ORAL | Status: DC
Start: 2014-10-23 — End: 2014-12-25

## 2014-10-23 NOTE — Telephone Encounter (Signed)
Pt would like to talk to you about getting his Pradaxa switched please.

## 2014-10-23 NOTE — Addendum Note (Signed)
Addended by: Freddi Starr on: 10/23/2014 04:43 PM   Modules accepted: Orders, Medications

## 2014-10-23 NOTE — Telephone Encounter (Signed)
Spoke with pt, per kristin alvstd pharm md, patient instructed to take warfarin 5 mg alternating with 7.5 mg. He will take pradaxa and warfarin together x 3 days and stop the pradaxa and cont the warfarin. Follow up [facility] appt made. Patient repeated all instructions back to me.

## 2014-10-23 NOTE — Telephone Encounter (Signed)
Spoke with pt, he can not afford pradaxa and wants to switch back to warfarin. Will forward for dr [name] review

## 2014-10-23 NOTE — Telephone Encounter (Signed)
Ok to DC change from pradaxa to coumadin; follow in coumadin clinic. [name] [name]

## 2014-11-02 ENCOUNTER — Ambulatory Visit (INDEPENDENT_AMBULATORY_CARE_PROVIDER_SITE_OTHER): Payer: Medicare Other | Admitting: Internal Medicine

## 2014-11-02 ENCOUNTER — Encounter: Payer: Self-pay | Admitting: Internal Medicine

## 2014-11-02 VITALS — BP 140/80 | HR 61 | Ht 73.0 in | Wt 250.0 lb

## 2014-11-02 DIAGNOSIS — I255 Ischemic cardiomyopathy: Secondary | ICD-10-CM

## 2014-11-02 DIAGNOSIS — I5022 Chronic systolic (congestive) heart failure: Secondary | ICD-10-CM

## 2014-11-02 DIAGNOSIS — I2589 Other forms of chronic ischemic heart disease: Secondary | ICD-10-CM

## 2014-11-02 DIAGNOSIS — I480 Paroxysmal atrial fibrillation: Secondary | ICD-10-CM

## 2014-11-02 LAB — MDC_IDC_ENUM_SESS_TYPE_INCLINIC
Brady Statistic RV Percent Paced: 97 %
Date Time Interrogation Session: 20151123130246
HighPow Impedance: 78.75 Ohm
Implantable Pulse Generator Serial Number: 1075181
Lead Channel Impedance Value: 437.5 Ohm
Lead Channel Impedance Value: 462.5 Ohm
Lead Channel Pacing Threshold Amplitude: 0.625 V
Lead Channel Pacing Threshold Amplitude: 0.875 V
Lead Channel Pacing Threshold Pulse Width: 0.5 ms
Lead Channel Pacing Threshold Pulse Width: 0.5 ms
Lead Channel Pacing Threshold Pulse Width: 0.5 ms
Lead Channel Sensing Intrinsic Amplitude: 5 mV
Lead Channel Setting Pacing Amplitude: 1.875
Lead Channel Setting Pacing Amplitude: 2 V
Lead Channel Setting Pacing Amplitude: 2 V
Lead Channel Setting Pacing Pulse Width: 0.5 ms
Lead Channel Setting Sensing Sensitivity: 0.5 mV
MDC IDC MSMT BATTERY REMAINING LONGEVITY: 57.6 mo
MDC IDC MSMT LEADCHNL LV IMPEDANCE VALUE: 437.5 Ohm
MDC IDC MSMT LEADCHNL LV PACING THRESHOLD AMPLITUDE: 0.75 V
MDC IDC MSMT LEADCHNL RV SENSING INTR AMPL: 11.7 mV
MDC IDC PG MODEL: 3265
MDC IDC SET LEADCHNL RV PACING PULSEWIDTH: 0.5 ms
MDC IDC STAT BRADY RA PERCENT PACED: 82 %
Zone Setting Detection Interval: 280 ms
Zone Setting Detection Interval: 340 ms

## 2014-11-02 NOTE — Patient Instructions (Signed)
Your physician wants you to follow-up in: 12 months with Dr. [name] You will receive a reminder letter in the mail two months in advance. If you don't receive a letter, please call our office to schedule the follow-up appointment.  Remote monitoring is used to monitor your Pacemaker of ICD from home. This monitoring reduces the number of office visits required to check your device to one time per year. It allows us to keep an eye on the functioning of your device to ensure it is working properly. You are scheduled for a device check from home on 02/03/15. You may send your transmission at any time that day. If you have a wireless device, the transmission will be sent automatically. After your physician reviews your transmission, you will receive a postcard with your next transmission date.    

## 2014-11-02 NOTE — Progress Notes (Signed)
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  Since his last visit, the patient reports doing very well.  His wife died in 26-Aug-2023 and he continues to grieve.  Today, he denies symptoms of palpitations, chest pain,  lower extremity edema, dizziness, presyncope, syncope, or ICD shocks.  The patient is otherwise without complaint today.   Past Medical History  Diagnosis Date  . Atrial fibrillation     s/p prior DCCV;  Amiodarone Rx.  . CAD (coronary artery disease)     s/p CABG;  cath 10/07:  LM ok, LAD occluded, AV CFX 95%, pRCA occluded; L-LAD, S-OM2/OM3, S-PDA ok  . Cerebrovascular disease     s/p prior Left CEA;  followed by Dr. [name]  . COPD (chronic obstructive pulmonary disease)   . Hyperlipidemia   . Hypertension   . Mitral insufficiency     hx of. not noted on echo 01/2012  . Nephrolithiasis   . Ischemic cardiomyopathy     echo 5/09: EF 55%, mild LAE;  Myoview 11/10: inf and apical scar with mild peri-infarct ischemia, EF 44%. Echo 01/26/12 with EF 20-25% but previously known to be 30% on echoes before last one  . Iliac artery aneurysm, left     followed by Dr. [name]  . Aortic dissection     H/O focal aortic dissection  . LBBB (left bundle branch block)   . CHF (congestive heart failure)   . Myocardial infarction   . Renal insufficiency     01/2012  . Stroke    Past Surgical History  Procedure Laterality Date  . Uteroscopy    . Coronary artery bypass graft  1998  . Left cea  07/05  . Appendectomy  09/11/11  . Carotid endarterectomy  2005    left  . Biventricular defibrillator implantation  08/22/12    SJM Quadra Assura BiV ICD implanted by Dr [name]  . Spine surgery  Sep 27, 2014    Lower Back L 4  and   L 5    Current Outpatient Prescriptions  Medication Sig Dispense Refill  . albuterol (PROVENTIL HFA;VENTOLIN HFA) 108 (90 BASE) MCG/ACT inhaler Inhale 2 puffs  into the lungs every 4 (four) hours as needed for wheezing. 1 Inhaler 0  . amiodarone (PACERONE) 200 MG tablet TAKE 1 TABLET EVERY DAY (Patient taking differently: TAKE 1 TABLET BY MOUTH EVERY DAY) 90 tablet 2  . amLODipine (NORVASC) 10 MG tablet Take 1 tablet (10 mg total) by mouth daily. 90 tablet 4  . docusate sodium (COLACE) 100 MG capsule Take 1 capsule (100 mg total) by mouth every 12 (twelve) hours. 60 capsule 0  . furosemide (LASIX) 40 MG tablet Take 40 mg by mouth daily as needed for fluid or edema.     . hydrALAZINE (APRESOLINE) 50 MG tablet Take 0.5 tablets (25 mg total) by mouth 2 (two) times daily. 90 tablet 1  . ipratropium-albuterol (DUONEB) 0.5-2.5 (3) MG/3ML SOLN Take 3 mLs by nebulization every 4 (four) hours as needed (for shortness of breath).    [name] lisinopril (PRINIVIL,ZESTRIL) 40 MG tablet Take 40 mg by mouth daily.    . magnesium citrate SOLN Take 296 mLs (1 Bottle total) by mouth once. 195 mL 0  . metoprolol succinate (TOPROL-XL) 25 MG 24 hr tablet Take 12.5 mg by mouth daily.    . Multiple Vitamin (MULITIVITAMIN WITH MINERALS) TABS Take 1 tablet by mouth  daily.    . naproxen sodium (ALEVE) 220 MG tablet Take 440 mg by mouth 2 (two) times daily as needed (for pain).    . niacin (NIASPAN) 500 MG CR tablet Take 1,000 mg by mouth 2 (two) times daily.     . potassium chloride SA (K-DUR,KLOR-CON) 20 MEQ tablet TAKE 2 TABLETS THREE TIMES DAILY (Patient taking differently: TAKE 2 TABLETS BY MOUTH THREE TIMES DAILY) 540 tablet 0  . pravastatin (PRAVACHOL) 80 MG tablet Take 1 tablet (80 mg total) by mouth daily. 90 tablet 2  . warfarin (COUMADIN) 5 MG tablet Take as directed (Patient taking differently: Take as directed by the coumadin clinic) 90 tablet 3   No current facility-administered medications for this visit.    Physical Exam: Filed Vitals:   11/02/14 1239  BP: 140/80  Pulse: 61  Height: 6\' 1"  (1.854 m)  Weight: 250 lb (113.399 kg)    GEN- The patient is well  appearing, alert and oriented x 3 today.   Head- normocephalic, atraumatic Eyes-  Sclera clear, conjunctiva pink Ears- hearing intact Oropharynx- clear Lungs- Clear to ausculation bilaterally, normal work of breathing Chest- ICD pocket is well healed Heart- Regular rate and rhythm, no murmurs, rubs or gallops, PMI not laterally displaced GI- soft, NT, ND, + BS Extremities- no clubbing, cyanosis, or edema  ICD interrogation- reviewed in detail today,  See PACEART report  Assessment and Plan:  1.  Chronic systolic dysfunction euvolemic today  He is >95% BiV paced Stable on an appropriate medical regimen Normal ICD function See Pace Art report No changes today  2. Afib Maintaining sinus rhythm with amiodarone May be able to decrease amiodarone to 100mg  daily eventually.  Dr Jens Somrenshaw to follow LFTs/TFTs Continue long term anticoagulation  3. HTN Stable No change required today  [name] Return to see me in 1year

## 2014-11-03 ENCOUNTER — Encounter: Payer: Self-pay | Admitting: Internal Medicine

## 2014-11-09 ENCOUNTER — Ambulatory Visit (INDEPENDENT_AMBULATORY_CARE_PROVIDER_SITE_OTHER): Payer: Medicare Other | Admitting: Pharmacist

## 2014-11-09 ENCOUNTER — Other Ambulatory Visit: Payer: Self-pay | Admitting: Cardiology

## 2014-11-09 DIAGNOSIS — Z7901 Long term (current) use of anticoagulants: Secondary | ICD-10-CM

## 2014-11-09 DIAGNOSIS — I6340 Cerebral infarction due to embolism of unspecified cerebral artery: Secondary | ICD-10-CM

## 2014-11-09 DIAGNOSIS — I480 Paroxysmal atrial fibrillation: Secondary | ICD-10-CM

## 2014-11-09 LAB — POCT INR: INR: 1.6

## 2014-11-16 ENCOUNTER — Ambulatory Visit (INDEPENDENT_AMBULATORY_CARE_PROVIDER_SITE_OTHER): Payer: Medicare Other

## 2014-11-16 DIAGNOSIS — I4891 Unspecified atrial fibrillation: Secondary | ICD-10-CM

## 2014-11-16 DIAGNOSIS — I6340 Cerebral infarction due to embolism of unspecified cerebral artery: Secondary | ICD-10-CM

## 2014-11-16 DIAGNOSIS — Z7901 Long term (current) use of anticoagulants: Secondary | ICD-10-CM

## 2014-11-16 DIAGNOSIS — I480 Paroxysmal atrial fibrillation: Secondary | ICD-10-CM

## 2014-11-16 LAB — POCT INR: INR: 2.4

## 2014-11-19 ENCOUNTER — Encounter (HOSPITAL_COMMUNITY): Payer: Self-pay | Admitting: Internal Medicine

## 2014-11-30 ENCOUNTER — Ambulatory Visit (INDEPENDENT_AMBULATORY_CARE_PROVIDER_SITE_OTHER): Payer: Medicare Other | Admitting: Pharmacist

## 2014-11-30 DIAGNOSIS — I480 Paroxysmal atrial fibrillation: Secondary | ICD-10-CM

## 2014-11-30 DIAGNOSIS — I6340 Cerebral infarction due to embolism of unspecified cerebral artery: Secondary | ICD-10-CM

## 2014-11-30 DIAGNOSIS — I4891 Unspecified atrial fibrillation: Secondary | ICD-10-CM

## 2014-11-30 DIAGNOSIS — Z7901 Long term (current) use of anticoagulants: Secondary | ICD-10-CM

## 2014-11-30 LAB — POCT INR: INR: 1.4

## 2014-12-10 ENCOUNTER — Ambulatory Visit (INDEPENDENT_AMBULATORY_CARE_PROVIDER_SITE_OTHER): Payer: Medicare Other | Admitting: Pharmacist

## 2014-12-10 DIAGNOSIS — I4891 Unspecified atrial fibrillation: Secondary | ICD-10-CM

## 2014-12-10 DIAGNOSIS — I480 Paroxysmal atrial fibrillation: Secondary | ICD-10-CM

## 2014-12-10 DIAGNOSIS — Z7901 Long term (current) use of anticoagulants: Secondary | ICD-10-CM

## 2014-12-10 DIAGNOSIS — I6340 Cerebral infarction due to embolism of unspecified cerebral artery: Secondary | ICD-10-CM

## 2014-12-10 LAB — POCT INR: INR: 1.7

## 2014-12-11 ENCOUNTER — Other Ambulatory Visit: Payer: Self-pay | Admitting: Cardiology

## 2014-12-14 NOTE — Progress Notes (Signed)
HPI: FU CAD. He has a history of coronary artery disease, status post coronary artery bypassing graft, history of ischemic cardiomyopathy, PAF as well as COPD. His last catheterization in August of 2013 showed normal left main, occluded LAD, a small OM 2 with an ostial 95% lesion, 2 moderate size obtuse marginals were occluded. The right coronary was occluded. The LIMA to the LAD was patent. The saphenous vein graft to the diagonal had a 50% proximal stenosis. The saphenous vein graft to the obtuse marginal was patent. Saphenous vein graft to the right coronary artery had a mid 40% lesion. Ejection fraction was 35%. Patient subsequently had a biventricular ICD implanted in September of 2013. He also has a history of focal aortic dissection being managed medically. Note, he also has a left iliac artery aneurysm and mild cerebrovascular disease with history of carotid endarterectomy. He is followed by vascular surgery (Dr. [name]) for these issues. Last echocardiogram in September 2014 showed an ejection fraction of 30-35%. The aortic root was mildly dilated. There was biatrial enlargement and mild mitral regurgitation. Since last seen, he has dyspnea on exertion which is unchanged. No orthopnea, PND, pedal edema or syncope. He has had an occasional chest pain for 1-2 seconds but no exertional symptoms.  Current Outpatient Prescriptions  Medication Sig Dispense Refill  . albuterol (PROVENTIL HFA;VENTOLIN HFA) 108 (90 BASE) MCG/ACT inhaler Inhale 2 puffs into the lungs every 4 (four) hours as needed for wheezing. 1 Inhaler 0  . amiodarone (PACERONE) 200 MG tablet TAKE 1 TABLET EVERY DAY (Patient taking differently: TAKE 1 TABLET BY MOUTH EVERY DAY) 90 tablet 2  . amLODipine (NORVASC) 10 MG tablet Take 1 tablet (10 mg total) by mouth daily. 90 tablet 4  . docusate sodium (COLACE) 100 MG capsule Take 1 capsule (100 mg total) by mouth every 12 (twelve) hours. 60 capsule 0  . furosemide (LASIX) 40 MG  tablet Take 40 mg by mouth daily as needed for fluid or edema.     . hydrALAZINE (APRESOLINE) 50 MG tablet Take 0.5 tablets (25 mg total) by mouth 2 (two) times daily. 90 tablet 1  . ipratropium-albuterol (DUONEB) 0.5-2.5 (3) MG/3ML SOLN Take 3 mLs by nebulization every 4 (four) hours as needed (for shortness of breath).    [name] lisinopril (PRINIVIL,ZESTRIL) 40 MG tablet Take 40 mg by mouth daily.    . magnesium citrate SOLN Take 296 mLs (1 Bottle total) by mouth once. 195 mL 0  . metoprolol succinate (TOPROL-XL) 25 MG 24 hr tablet TAKE 1/2 TABLET (12.5MG ) EVERY DAY 45 tablet 3  . Multiple Vitamin (MULITIVITAMIN WITH MINERALS) TABS Take 1 tablet by mouth daily.    . naproxen sodium (ALEVE) 220 MG tablet Take 440 mg by mouth 2 (two) times daily as needed (for pain).    . niacin (NIASPAN) 500 MG CR tablet Take 1,000 mg by mouth 2 (two) times daily.     . potassium chloride SA (K-DUR,KLOR-CON) 20 MEQ tablet TAKE 2 TABLETS THREE TIMES DAILY 540 tablet 0  . pravastatin (PRAVACHOL) 80 MG tablet Take 1 tablet (80 mg total) by mouth daily. 90 tablet 2  . warfarin (COUMADIN) 5 MG tablet Take as directed (Patient taking differently: Take as directed by the coumadin clinic) 90 tablet 3   No current facility-administered medications for this visit.     Past Medical History  Diagnosis Date  . Atrial fibrillation     s/p prior DCCV;  Amiodarone Rx.  . CAD (coronary  artery disease)     s/p CABG;  cath 10/07:  LM ok, LAD occluded, AV CFX 95%, pRCA occluded; L-LAD, S-OM2/OM3, S-PDA ok  . Cerebrovascular disease     s/p prior Left CEA;  followed by Dr. [name]  . COPD (chronic obstructive pulmonary disease)   . Hyperlipidemia   . Hypertension   . Mitral insufficiency     hx of. not noted on echo 01/2012  . Nephrolithiasis   . Ischemic cardiomyopathy     echo 5/09: EF 55%, mild LAE;  Myoview 11/10: inf and apical scar with mild peri-infarct ischemia, EF 44%. Echo 01/26/12 with EF 20-25% but previously known to  be 30% on echoes before last one  . Iliac artery aneurysm, left     followed by Dr. [name]  . Aortic dissection     H/O focal aortic dissection  . LBBB (left bundle branch block)   . CHF (congestive heart failure)   . Myocardial infarction   . Renal insufficiency     01/2012  . Stroke     Past Surgical History  Procedure Laterality Date  . Uteroscopy    . Coronary artery bypass graft  1998  . Left cea  07/05  . Appendectomy  09/11/11  . Carotid endarterectomy  2005    left  . Biventricular defibrillator implantation  08/22/12    SJM Quadra Assura BiV ICD implanted by Dr [name]  . Spine surgery  Sep 27, 2014    Lower Back L 4  and   L 5  . Bi-ventricular implantable cardioverter defibrillator N/A 08/22/2012    Procedure: BI-VENTRICULAR IMPLANTABLE CARDIOVERTER DEFIBRILLATOR  (CRT-D);  Surgeon: [name], MD;  Location: [facility] CATH LAB;  Service: Cardiovascular;  Laterality: N/A;    History   Social History  . Marital Status: Married    Spouse Name: N/A    Number of Children: 3  . Years of Education: N/A   Occupational History  . RETIRED---truck driver    Social History Main Topics  . Smoking status: Former Smoker -- 1.50 packs/day for 57 years    Types: Cigarettes    Quit date: 12/11/2004  . Smokeless tobacco: Never Used  . Alcohol Use: No     Comment: 6 yrs ago   . Drug Use: No  . Sexual Activity: No   Other Topics Concern  . Not on file   Social History Narrative   Lives in [place], [place] with wife.    Caffeine Use: Does drink coffee    ROS: no fevers or chills, productive cough, hemoptysis, dysphasia, odynophagia, melena, hematochezia, dysuria, hematuria, rash, seizure activity, orthopnea, PND, pedal edema, claudication. Remaining systems are negative.  Physical Exam: Well-developed obese in no acute distress.  Skin is warm and dry.  HEENT is normal.  Neck is supple.  Chest is clear to auscultation with normal expansion.  Cardiovascular exam is regular  rate and rhythm.  Abdominal exam nontender or distended. No masses palpated. Extremities show no edema. neuro grossly intact  Electrocardiogram shows atrioventricular pacing.

## 2014-12-15 ENCOUNTER — Ambulatory Visit (INDEPENDENT_AMBULATORY_CARE_PROVIDER_SITE_OTHER): Payer: Medicare Other | Admitting: Cardiology

## 2014-12-15 ENCOUNTER — Ambulatory Visit (INDEPENDENT_AMBULATORY_CARE_PROVIDER_SITE_OTHER): Payer: Medicare Other

## 2014-12-15 ENCOUNTER — Encounter: Payer: Self-pay | Admitting: Cardiology

## 2014-12-15 DIAGNOSIS — I4891 Unspecified atrial fibrillation: Secondary | ICD-10-CM

## 2014-12-15 DIAGNOSIS — Z95810 Presence of automatic (implantable) cardiac defibrillator: Secondary | ICD-10-CM

## 2014-12-15 DIAGNOSIS — I480 Paroxysmal atrial fibrillation: Secondary | ICD-10-CM

## 2014-12-15 DIAGNOSIS — I10 Essential (primary) hypertension: Secondary | ICD-10-CM

## 2014-12-15 DIAGNOSIS — E7800 Pure hypercholesterolemia, unspecified: Secondary | ICD-10-CM

## 2014-12-15 DIAGNOSIS — I714 Abdominal aortic aneurysm, without rupture, unspecified: Secondary | ICD-10-CM

## 2014-12-15 DIAGNOSIS — I2510 Atherosclerotic heart disease of native coronary artery without angina pectoris: Secondary | ICD-10-CM

## 2014-12-15 DIAGNOSIS — I6340 Cerebral infarction due to embolism of unspecified cerebral artery: Secondary | ICD-10-CM

## 2014-12-15 DIAGNOSIS — Z7901 Long term (current) use of anticoagulants: Secondary | ICD-10-CM

## 2014-12-15 DIAGNOSIS — I5022 Chronic systolic (congestive) heart failure: Secondary | ICD-10-CM

## 2014-12-15 DIAGNOSIS — I255 Ischemic cardiomyopathy: Secondary | ICD-10-CM

## 2014-12-15 LAB — POCT INR: INR: 2.3

## 2014-12-15 MED ORDER — HYDRALAZINE HCL 50 MG PO TABS: 25.0000 mg | ORAL_TABLET | Freq: Three times a day (TID) | ORAL | Status: DC

## 2014-12-15 MED ORDER — METOPROLOL SUCCINATE ER 25 MG PO TB24: 25.0000 mg | ORAL_TABLET | Freq: Every day | ORAL | Status: DC

## 2014-12-15 NOTE — Assessment & Plan Note (Signed)
Continue statin. 

## 2014-12-15 NOTE — Assessment & Plan Note (Signed)
Blood pressure elevated.increase hydralazine to 25 mg by mouth 3 times a day. Follow blood pressure at home and advance medications as needed.

## 2014-12-15 NOTE — Assessment & Plan Note (Signed)
Continue statin. Followed by vascular surgery. 

## 2014-12-15 NOTE — Assessment & Plan Note (Signed)
euvolemic on examination. Continue present dose of Lasix. Check potassium and renal function. 

## 2014-12-15 NOTE — Assessment & Plan Note (Signed)
Continue ACE inhibitor and beta blocker. Increase Toprol to 25 mg daily.

## 2014-12-15 NOTE — Assessment & Plan Note (Addendum)
Continue beta blocker and Coumadin. Continue amiodarone. Check TSH, liver functions, hemoglobin and chest x-ray.

## 2014-12-15 NOTE — Assessment & Plan Note (Signed)
Followed by vascular surgery. 

## 2014-12-15 NOTE — Patient Instructions (Signed)
Your physician wants you to follow-up in: 6 MONTHS WITH DR [name] You will receive a reminder letter in the mail two months in advance. If you don't receive a letter, please call our office to schedule the follow-up appointment.   A chest x-ray takes a picture of the organs and structures inside the chest, including the heart, lungs, and blood vessels. This test can show several things, including, whether the heart is enlarges; whether fluid is building up in the lungs; and whether pacemaker / defibrillator leads are still in place. [address]=[place] [facility]=GO TO THE BASEMENT  Your physician recommends that you return for lab work WITH CHEST XRAY  INCREASE METOPROLOL TO 25 MG ONCE DAILY  INCREASE HYDRALAZINE TO 25 MG THREE TIMES DAILY

## 2014-12-15 NOTE — Assessment & Plan Note (Signed)
Followed by electrophysiology. 

## 2014-12-16 ENCOUNTER — Other Ambulatory Visit (INDEPENDENT_AMBULATORY_CARE_PROVIDER_SITE_OTHER): Payer: Medicare Other

## 2014-12-16 ENCOUNTER — Ambulatory Visit (INDEPENDENT_AMBULATORY_CARE_PROVIDER_SITE_OTHER)
Admission: RE | Admit: 2014-12-16 | Discharge: 2014-12-16 | Disposition: A | Discharge status: Home / Self Care | Payer: Medicare Other | Source: Ambulatory Visit | Attending: Cardiology | Admitting: Cardiology

## 2014-12-16 DIAGNOSIS — I4891 Unspecified atrial fibrillation: Secondary | ICD-10-CM

## 2014-12-16 LAB — BASIC METABOLIC PANEL
BUN: 19 mg/dL (ref 6–23)
CALCIUM: 8.9 mg/dL (ref 8.4–10.5)
CO2: 27 mEq/L (ref 19–32)
CREATININE: 1.12 mg/dL (ref 0.50–1.35)
Chloride: 107 mEq/L (ref 96–112)
GLUCOSE: 128 mg/dL — AB (ref 70–99)
Potassium: 3.7 mEq/L (ref 3.5–5.3)
Sodium: 144 mEq/L (ref 135–145)

## 2014-12-16 LAB — HEPATIC FUNCTION PANEL
ALBUMIN: 4.1 g/dL (ref 3.5–5.2)
ALK PHOS: 101 U/L (ref 39–117)
ALT: 17 U/L (ref 0–53)
AST: 18 U/L (ref 0–37)
BILIRUBIN TOTAL: 0.7 mg/dL (ref 0.2–1.2)
Bilirubin, Direct: 0 mg/dL (ref 0.0–0.3)
Total Protein: 7.1 g/dL (ref 6.0–8.3)

## 2014-12-16 LAB — CBC
HEMATOCRIT: 48.2 % (ref 39.0–52.0)
Hemoglobin: 15.9 g/dL (ref 13.0–17.0)
MCHC: 32.9 g/dL (ref 30.0–36.0)
MCV: 91.9 fl (ref 78.0–100.0)
PLATELETS: 228 10*3/uL (ref 150.0–400.0)
RBC: 5.25 Mil/uL (ref 4.22–5.81)
RDW: 15.2 % (ref 11.5–15.5)
WBC: 8.6 10*3/uL (ref 4.0–10.5)

## 2014-12-18 ENCOUNTER — Encounter: Payer: Self-pay | Admitting: Cardiology

## 2014-12-18 NOTE — Telephone Encounter (Signed)
Pt says that he is retuning Debra's call  Thanks

## 2014-12-18 NOTE — Telephone Encounter (Signed)
This encounter was created in error - please disregard.

## 2014-12-23 ENCOUNTER — Emergency Department (HOSPITAL_COMMUNITY): Payer: Medicare Other

## 2014-12-23 ENCOUNTER — Inpatient Hospital Stay (HOSPITAL_COMMUNITY)
Admission: EM | Admit: 2014-12-23 | Discharge: 2014-12-25 | DRG: 065 | Disposition: A | Discharge status: Home / Self Care | Payer: Medicare Other | Attending: Internal Medicine | Admitting: Internal Medicine

## 2014-12-23 ENCOUNTER — Encounter (HOSPITAL_COMMUNITY): Payer: Self-pay | Admitting: Emergency Medicine

## 2014-12-23 DIAGNOSIS — I340 Nonrheumatic mitral (valve) insufficiency: Secondary | ICD-10-CM | POA: Diagnosis present

## 2014-12-23 DIAGNOSIS — Z8673 Personal history of transient ischemic attack (TIA), and cerebral infarction without residual deficits: Secondary | ICD-10-CM | POA: Diagnosis not present

## 2014-12-23 DIAGNOSIS — Z794 Long term (current) use of insulin: Secondary | ICD-10-CM

## 2014-12-23 DIAGNOSIS — E780 Pure hypercholesterolemia: Secondary | ICD-10-CM | POA: Diagnosis present

## 2014-12-23 DIAGNOSIS — R531 Weakness: Secondary | ICD-10-CM | POA: Diagnosis not present

## 2014-12-23 DIAGNOSIS — I10 Essential (primary) hypertension: Secondary | ICD-10-CM | POA: Diagnosis present

## 2014-12-23 DIAGNOSIS — Z7901 Long term (current) use of anticoagulants: Secondary | ICD-10-CM

## 2014-12-23 DIAGNOSIS — I255 Ischemic cardiomyopathy: Secondary | ICD-10-CM | POA: Diagnosis present

## 2014-12-23 DIAGNOSIS — I2510 Atherosclerotic heart disease of native coronary artery without angina pectoris: Secondary | ICD-10-CM | POA: Diagnosis present

## 2014-12-23 DIAGNOSIS — E785 Hyperlipidemia, unspecified: Secondary | ICD-10-CM | POA: Diagnosis present

## 2014-12-23 DIAGNOSIS — E119 Type 2 diabetes mellitus without complications: Secondary | ICD-10-CM | POA: Diagnosis present

## 2014-12-23 DIAGNOSIS — J449 Chronic obstructive pulmonary disease, unspecified: Secondary | ICD-10-CM | POA: Diagnosis present

## 2014-12-23 DIAGNOSIS — G8191 Hemiplegia, unspecified affecting right dominant side: Secondary | ICD-10-CM | POA: Diagnosis present

## 2014-12-23 DIAGNOSIS — I4821 Permanent atrial fibrillation: Secondary | ICD-10-CM | POA: Diagnosis present

## 2014-12-23 DIAGNOSIS — I639 Cerebral infarction, unspecified: Principal | ICD-10-CM | POA: Diagnosis present

## 2014-12-23 DIAGNOSIS — I252 Old myocardial infarction: Secondary | ICD-10-CM | POA: Diagnosis not present

## 2014-12-23 DIAGNOSIS — I998 Other disorder of circulatory system: Secondary | ICD-10-CM

## 2014-12-23 DIAGNOSIS — I714 Abdominal aortic aneurysm, without rupture: Secondary | ICD-10-CM | POA: Diagnosis present

## 2014-12-23 DIAGNOSIS — I999 Unspecified disorder of circulatory system: Secondary | ICD-10-CM

## 2014-12-23 DIAGNOSIS — Z87442 Personal history of urinary calculi: Secondary | ICD-10-CM

## 2014-12-23 DIAGNOSIS — Z87891 Personal history of nicotine dependence: Secondary | ICD-10-CM | POA: Diagnosis not present

## 2014-12-23 DIAGNOSIS — Z951 Presence of aortocoronary bypass graft: Secondary | ICD-10-CM | POA: Diagnosis not present

## 2014-12-23 DIAGNOSIS — I5022 Chronic systolic (congestive) heart failure: Secondary | ICD-10-CM | POA: Diagnosis present

## 2014-12-23 DIAGNOSIS — R42 Dizziness and giddiness: Secondary | ICD-10-CM

## 2014-12-23 DIAGNOSIS — Z95810 Presence of automatic (implantable) cardiac defibrillator: Secondary | ICD-10-CM

## 2014-12-23 DIAGNOSIS — I723 Aneurysm of iliac artery: Secondary | ICD-10-CM | POA: Diagnosis present

## 2014-12-23 DIAGNOSIS — N289 Disorder of kidney and ureter, unspecified: Secondary | ICD-10-CM | POA: Diagnosis present

## 2014-12-23 DIAGNOSIS — I4891 Unspecified atrial fibrillation: Secondary | ICD-10-CM | POA: Diagnosis present

## 2014-12-23 LAB — CBC WITH DIFFERENTIAL/PLATELET
BASOS ABS: 0.1 10*3/uL (ref 0.0–0.1)
Basophils Relative: 1 % (ref 0–1)
Eosinophils Absolute: 0.2 10*3/uL (ref 0.0–0.7)
Eosinophils Relative: 3 % (ref 0–5)
HCT: 46 % (ref 39.0–52.0)
Hemoglobin: 15.4 g/dL (ref 13.0–17.0)
LYMPHS ABS: 3 10*3/uL (ref 0.7–4.0)
LYMPHS PCT: 37 % (ref 12–46)
MCH: 30.5 pg (ref 26.0–34.0)
MCHC: 33.5 g/dL (ref 30.0–36.0)
MCV: 91.1 fL (ref 78.0–100.0)
Monocytes Absolute: 0.8 10*3/uL (ref 0.1–1.0)
Monocytes Relative: 10 % (ref 3–12)
NEUTROS ABS: 4.1 10*3/uL (ref 1.7–7.7)
NEUTROS PCT: 49 % (ref 43–77)
PLATELETS: 171 10*3/uL (ref 150–400)
RBC: 5.05 MIL/uL (ref 4.22–5.81)
RDW: 14.7 % (ref 11.5–15.5)
WBC: 8.2 10*3/uL (ref 4.0–10.5)

## 2014-12-23 LAB — COMPREHENSIVE METABOLIC PANEL
ALT: 16 U/L (ref 0–53)
AST: 21 U/L (ref 0–37)
Albumin: 3.8 g/dL (ref 3.5–5.2)
Alkaline Phosphatase: 96 U/L (ref 39–117)
Anion gap: 16 — ABNORMAL HIGH (ref 5–15)
BUN: 18 mg/dL (ref 6–23)
CALCIUM: 9.1 mg/dL (ref 8.4–10.5)
CHLORIDE: 107 meq/L (ref 96–112)
CO2: 22 mmol/L (ref 19–32)
Creatinine, Ser: 1.24 mg/dL (ref 0.50–1.35)
GFR, EST AFRICAN AMERICAN: 65 mL/min — AB (ref >90)
GFR, EST NON AFRICAN AMERICAN: 56 mL/min — AB (ref >90)
GLUCOSE: 99 mg/dL (ref 70–99)
Potassium: 3.5 mmol/L (ref 3.5–5.1)
SODIUM: 145 mmol/L (ref 135–145)
Total Bilirubin: 0.4 mg/dL (ref 0.3–1.2)
Total Protein: 6.2 g/dL (ref 6.0–8.3)

## 2014-12-23 LAB — PROTIME-INR
INR: 1.95 — AB (ref 0.00–1.49)
Prothrombin Time: 22.4 seconds — ABNORMAL HIGH (ref 11.6–15.2)

## 2014-12-23 LAB — I-STAT TROPONIN, ED: TROPONIN I, POC: 0 ng/mL (ref 0.00–0.08)

## 2014-12-23 LAB — BRAIN NATRIURETIC PEPTIDE: B Natriuretic Peptide: 126.1 pg/mL — ABNORMAL HIGH (ref 0.0–100.0)

## 2014-12-23 MED ORDER — MECLIZINE HCL 25 MG PO TABS
25.0000 mg | ORAL_TABLET | Freq: Once | ORAL | Status: AC
Start: 2014-12-23 — End: 2014-12-23
  Administered 2014-12-23: 25 mg via ORAL
  Filled 2014-12-23: qty 1

## 2014-12-23 NOTE — ED Notes (Signed)
PT to ED via [facility] with c/o hypertension.  St's he took his B/P at home and it was 195/100.  Pt st's he takes meds for same and has not had any change in medications.  Pt alert and oriented x's 3.  Pt denies any chest pain

## 2014-12-23 NOTE — ED Notes (Signed)
Pt denies any pain or discomfort at this time.

## 2014-12-23 NOTE — Consult Note (Signed)
Neurology Consultation Reason for Consult: ataxia Referring Physician: Preston Fleeting, D  CC: "swimmy headed"  History is obtained from:patient, family  HPI: [name] [name] is a 74 y.o. male with a history of hypertension, atrial fibrillation currently on warfarin therapy. He presents with weakness and unsteadiness. He states that he has had some "dizziness" for about a week, but that night the got much worse and he has now noticed right-sided discoordination as well. He presented to the emergency room tonight, given that he was having difficulty walking and noticed that his blood pressure was in the 190s.   LKW: One week ago tpa given?: no, outside of window    ROS: A 14 point ROS was performed and is negative except as noted in the HPI.    Past Medical History  Diagnosis Date  . Atrial fibrillation     s/p prior DCCV;  Amiodarone Rx.  . CAD (coronary artery disease)     s/p CABG;  cath 10/07:  LM ok, LAD occluded, AV CFX 95%, pRCA occluded; L-LAD, S-OM2/OM3, S-PDA ok  . Cerebrovascular disease     s/p prior Left CEA;  followed by Dr. [name]  . COPD (chronic obstructive pulmonary disease)   . Hyperlipidemia   . Hypertension   . Mitral insufficiency     hx of. not noted on echo 01/2012  . Nephrolithiasis   . Ischemic cardiomyopathy     echo 5/09: EF 55%, mild LAE;  Myoview 11/10: inf and apical scar with mild peri-infarct ischemia, EF 44%. Echo 01/26/12 with EF 20-25% but previously known to be 30% on echoes before last one  . Iliac artery aneurysm, left     followed by Dr. [name]  . Aortic dissection     H/O focal aortic dissection  . LBBB (left bundle branch block)   . CHF (congestive heart failure)   . Myocardial infarction   . Renal insufficiency     01/2012  . Stroke     Family History: Uncle-stroke  Social History: Tob: Denies  Exam: Current vital signs: BP 168/89 mmHg  Pulse 65  Temp(Src) 98 F (36.7 C) (Oral)  Resp 20  Ht  (1.854 m)  Wt 112.492 kg (248  lb)  BMI 32.73 kg/m2  SpO2 95% Vital signs in last 24 hours: Temp:  [98 F (36.7 C)] 98 F (36.7 C) (01/13 1915) Pulse Rate:  [60-69] 65 (01/13 2130) Resp:  [11-20] 20 (01/13 2130) BP: (141-176)/(68-108) 168/89 mmHg (01/13 2130) SpO2:  [93 %-98 %] 95 % (01/13 2130) Weight:  [112.492 kg (248 lb)] 112.492 kg (248 lb) (01/13 1759)   Physical Exam  Constitutional: Appears well-developed and well-nourished.  Psych: Affect appropriate to situation Eyes: No scleral injection HENT: No OP obstrucion Head: Normocephalic.  Cardiovascular: Normal rate and regular rhythm.  Respiratory: Effort normal and breath sounds normal to anterior ascultation GI: Soft.  No distension. There is no tenderness.  Skin: WDI  Neuro: Mental Status: Patient is awake, alert, oriented to person, place, month, year, and situation. Patient is able to give a clear and coherent history. No signs of aphasia or neglect Cranial Nerves: II: Visual Fields are full. Pupils are equal, round, and reactive to light.   III,IV, VI: EOMI without ptosis or diploplia.  V: Facial sensation is diminished on the right VII: Facial movement is symmetric.  VIII: hearing is intact to voice X: Uvula elevates symmetrically XI: Shoulder shrug is symmetric. XII: tongue is midline without atrophy or fasciculations.  Motor: Tone is  normal. Bulk is normal. 5/5 strength was present on the left side, he has very mild 5 minus/5 strength in the right arm and 4+/5 strength in the right leg. Sensory: Sensation is diminished throughout the right side Deep Tendon Reflexes: 2+ and symmetric in the biceps and patellae.  Cerebellar: HKS are intact bilaterally. He does have ataxia with passpointing of the right arm. Intact finger-nose-finger on the left    I have reviewed labs in epic and the results pertinent to this consultation are: INR 1.95  I have reviewed the images obtained: CT head-unremarkable   Impression: 74 year old male with  likely small infarct on the left. Though his INR is borderline, this could be a thrombotic stroke due to hypercholesterolemia and hypertension as well. I would favor holding his Coumadin tonight until tomorrow when a repeat CT could demonstrate that there has not been a moderate size infarct that would necessitate holding his anticoagulation  Recommendations: 1. HgbA1c, fasting lipid panel 2. Repeat CT head tomorrow with CT angiogram of the head and neck 3. Frequent neuro checks 4. Echocardiogram 5. Carotid dopplers are not needed given a CT angiogram will be done 6. Prophylactic therapy-Aspirin - dose 325mg  PO or 300mg  PR for tonight, and likely restart anticoagulation soon if infarct is small. 7. Risk factor modification 8. Telemetry monitoring 9. PT consult, OT consult, Speech consult 10. nothing by mouth until stroke swallow screen has been passed    Ritta Slot, MD [facility] [phone]  If 7pm- 7am, please page neurology on call as listed in AMION.

## 2014-12-23 NOTE — ED Provider Notes (Signed)
74 year old male has had dizziness for almost 2 weeks. He describes his family headed feeling and he is off balance. 5 days ago, he fell while attending a sporting event. Today, disoriented feeling was much worse and he noticed that he is very off balance with tendency to fall to the right. He denies headache and her weakness but he has noticed decreased sensation in his right foot. On exam, there is quite a bruits. Lungs are clear, heart regular rate and rhythm. He has clear past-pointing and ataxia with finger to nose testing on the right but not on the left. On Romberg testing, he falls to the right. Findings are strongly suspicious of posterior circulation stroke. He will need admission for further evaluation. Unfortunately, he cannot have MRI scan because of implanted pacemaker/defibrillator.  I saw and evaluated the patient, reviewed the resident's note and I agree with the findings and plan.   Date: 12/23/2014@19 :46:14  Rate: 67  Rhythm: Electronic ventricular pacemaker  QRS Axis: left  Intervals: normal  ST/T Wave abnormalities: normal  Conduction Disutrbances:none  Narrative Interpretation: Electronic ventricular pacemaker. When compared with ECG of 12/15/2014, no significant changes are seen.  Old EKG Reviewed: unchanged    Dione Booze, MD 12/24/14 [phone]

## 2014-12-23 NOTE — ED Provider Notes (Signed)
CSN: [id number]     Arrival date & time 12/23/14  1756 History   First MD Initiated Contact with Patient 12/23/14 1917     Chief Complaint  Patient presents with  . Hypertension     (Consider location/radiation/quality/duration/timing/severity/associated sxs/prior Treatment) Patient is a 74 y.o. male presenting with dizziness.  Dizziness Quality:  Head spinning and vertigo ("swimmy headed") Severity:  Moderate Onset quality:  Gradual Duration: unclear onset, initially said 430PM, then said over last week and worsening today. Timing:  Constant Progression:  Worsening Chronicity:  New Context: head movement   Relieved by:  Nothing Worsened by:  Movement and turning head Ineffective treatments:  None tried Associated symptoms: no blood in stool, no chest pain, no diarrhea, no headaches, no nausea, no shortness of breath, no syncope, no vision changes and no vomiting   Risk factors: hx of stroke (hx of TIA per pt)     Past Medical History  Diagnosis Date  . Atrial fibrillation     s/p prior DCCV;  Amiodarone Rx.  . CAD (coronary artery disease)     s/p CABG;  cath 10/07:  LM ok, LAD occluded, AV CFX 95%, pRCA occluded; L-LAD, S-OM2/OM3, S-PDA ok  . Cerebrovascular disease     s/p prior Left CEA;  followed by Dr. [name]  . COPD (chronic obstructive pulmonary disease)   . Hyperlipidemia   . Hypertension   . Mitral insufficiency     hx of. not noted on echo 01/2012  . Nephrolithiasis   . Ischemic cardiomyopathy     echo 5/09: EF 55%, mild LAE;  Myoview 11/10: inf and apical scar with mild peri-infarct ischemia, EF 44%. Echo 01/26/12 with EF 20-25% but previously known to be 30% on echoes before last one  . Iliac artery aneurysm, left     followed by Dr. [name]  . Aortic dissection     H/O focal aortic dissection  . LBBB (left bundle branch block)   . CHF (congestive heart failure)   . Myocardial infarction   . Renal insufficiency     01/2012  . Stroke    Past Surgical  History  Procedure Laterality Date  . Uteroscopy    . Coronary artery bypass graft  1998  . Left cea  07/05  . Appendectomy  09/11/11  . Carotid endarterectomy  2005    left  . Biventricular defibrillator implantation  08/22/12    SJM Quadra Assura BiV ICD implanted by Dr [name]  . Spine surgery  Sep 27, 2014    Lower Back L 4  and   L 5  . Bi-ventricular implantable cardioverter defibrillator N/A 08/22/2012    Procedure: BI-VENTRICULAR IMPLANTABLE CARDIOVERTER DEFIBRILLATOR  (CRT-D);  Surgeon: [name], MD;  Location: [facility] CATH LAB;  Service: Cardiovascular;  Laterality: N/A;   Family History  Problem Relation Age of Onset  . Stroke Maternal Uncle    History  Substance Use Topics  . Smoking status: Former Smoker -- 1.50 packs/day for 57 years    Types: Cigarettes    Quit date: 12/11/2004  . Smokeless tobacco: Never Used  . Alcohol Use: No     Comment: 6 yrs ago     Review of Systems  Constitutional: Negative for fever, activity change, appetite change and fatigue.  HENT: Positive for congestion. Negative for sore throat.   Eyes: Negative for visual disturbance.  Respiratory: Negative for shortness of breath.   Cardiovascular: Negative for chest pain, leg swelling and syncope.  Gastrointestinal: Negative for  nausea, vomiting, abdominal pain, diarrhea, constipation and blood in stool.  Genitourinary: Negative for difficulty urinating.  Musculoskeletal: Positive for gait problem (however has not tried to walk since began feeling this way). Negative for back pain and neck stiffness.  Skin: Negative for rash.  Neurological: Positive for dizziness. Negative for syncope, facial asymmetry, speech difficulty, weakness (denies weakness initially, on reexamination reports he has had difficulty pushing gas pedal for past week), numbness (reports intermittent tingling in his right fingers "for years") and headaches.  Psychiatric/Behavioral:       Reports lost wife a few months ago       Allergies  Review of patient's allergies indicates no known allergies.  Home Medications   Prior to Admission medications   Medication Sig Start Date End Date Taking? Authorizing Provider  albuterol (PROVENTIL HFA;VENTOLIN HFA) 108 (90 BASE) MCG/ACT inhaler Inhale 2 puffs into the lungs every 4 (four) hours as needed for wheezing. 03/29/13  Yes Suzi Roots, MD  amiodarone (PACERONE) 200 MG tablet TAKE 1 TABLET EVERY DAY Patient taking differently: TAKE 1 TABLET BY MOUTH EVERY DAY 08/24/14  Yes Lewayne Bunting, MD  amLODipine (NORVASC) 10 MG tablet Take 1 tablet (10 mg total) by mouth daily. 04/30/14  Yes Lewayne Bunting, MD  docusate sodium (COLACE) 100 MG capsule Take 1 capsule (100 mg total) by mouth every 12 (twelve) hours. 06/04/14  Yes Rolland Porter, MD  furosemide (LASIX) 40 MG tablet Take 40 mg by mouth daily as needed for fluid or edema.  08/29/13  Yes Lewayne Bunting, MD  hydrALAZINE (APRESOLINE) 50 MG tablet Take 0.5 tablets (25 mg total) by mouth 3 (three) times daily. 12/15/14  Yes Lewayne Bunting, MD  ipratropium-albuterol (DUONEB) 0.5-2.5 (3) MG/3ML SOLN Take 3 mLs by nebulization every 4 (four) hours as needed (for shortness of breath).   Yes Historical Provider, MD  lisinopril (PRINIVIL,ZESTRIL) 40 MG tablet Take 40 mg by mouth daily.   Yes Historical Provider, MD  magnesium citrate SOLN Take 296 mLs (1 Bottle total) by mouth once. 06/04/14  Yes Rolland Porter, MD  metoprolol succinate (TOPROL-XL) 25 MG 24 hr tablet Take 1 tablet (25 mg total) by mouth daily. Patient taking differently: Take 12.5 mg by mouth daily.  12/15/14  Yes Lewayne Bunting, MD  Multiple Vitamin (MULITIVITAMIN WITH MINERALS) TABS Take 1 tablet by mouth daily.   Yes Historical Provider, MD  naproxen sodium (ALEVE) 220 MG tablet Take 440 mg by mouth 2 (two) times daily as needed (for pain).   Yes Historical Provider, MD  niacin (NIASPAN) 500 MG CR tablet Take 1,000 mg by mouth 2 (two) times daily.    Yes  Historical Provider, MD  potassium chloride SA (K-DUR,KLOR-CON) 20 MEQ tablet TAKE 2 TABLETS THREE TIMES DAILY 11/11/14  Yes Lewayne Bunting, MD  pravastatin (PRAVACHOL) 80 MG tablet Take 1 tablet (80 mg total) by mouth daily. 07/24/14  Yes Lewayne Bunting, MD  warfarin (COUMADIN) 5 MG tablet Take as directed Patient taking differently: Take 5-7.5 mg by mouth daily at 6 PM. Patient takes 5 mg on Tuesday and Saturday and other days patient takes 7.5 mg 10/23/14  Yes Lewayne Bunting, MD   BP 168/89 mmHg  Pulse 65  Temp(Src) 98 F (36.7 C) (Oral)  Resp 20  Ht 6\' 1"  (1.854 m)  Wt 248 lb (112.492 kg)  BMI 32.73 kg/m2  SpO2 95% Physical Exam  Constitutional: He is oriented to person, place, and time. He appears  well-developed and well-nourished. No distress.  HENT:  Head: Normocephalic and atraumatic.  Eyes: Conjunctivae and EOM are normal. Pupils are equal, round, and reactive to light.  Head impulse testing with pt still able to focus on nose No vertical skew Small amount of nystagmus when looking in both directions  Neck: Normal range of motion.  Cardiovascular: Normal rate, regular rhythm, normal heart sounds and intact distal pulses.  Exam reveals no gallop and no friction rub.   No murmur heard. Pulmonary/Chest: Effort normal and breath sounds normal. No respiratory distress. He has no wheezes. He has no rales.  Abdominal: Soft. He exhibits no distension. There is no tenderness. There is no guarding.  Musculoskeletal: He exhibits no edema.  Neurological: He is alert and oriented to person, place, and time. He has normal strength. No cranial nerve deficit or sensory deficit. Coordination (past pointing on finger to nose with right hand, rapid finger movements slow on right hand) and gait abnormal. GCS eye subscore is 4. GCS verbal subscore is 5. GCS motor subscore is 6.  Very mild right UE and LE weakness in comparison to left, appears to need more encouragement  Skin: Skin is warm and  dry. No rash noted. He is not diaphoretic.  Nursing note and vitals reviewed.   ED Course  Procedures (including critical care time) Labs Review Labs Reviewed  COMPREHENSIVE METABOLIC PANEL - Abnormal; Notable for the following:    GFR calc non Af Amer 56 (*)    GFR calc Af Amer 65 (*)    Anion gap 16 (*)    All other components within normal limits  PROTIME-INR - Abnormal; Notable for the following:    Prothrombin Time 22.4 (*)    INR 1.95 (*)    All other components within normal limits  BRAIN NATRIURETIC PEPTIDE - Abnormal; Notable for the following:    B Natriuretic Peptide 126.1 (*)    All other components within normal limits  CBC WITH DIFFERENTIAL  I-STAT TROPOININ, ED    Imaging Review Ct Head Wo Contrast  12/23/2014   CLINICAL DATA:  Dizziness with right-sided weakness. Initial encounter.  EXAM: CT HEAD WITHOUT CONTRAST  TECHNIQUE: Contiguous axial images were obtained from the base of the skull through the vertex without intravenous contrast.  COMPARISON:  Head CT 01/14/2013 and 01/13/2013.  FINDINGS: There is no evidence of acute intracranial hemorrhage, mass lesion, brain edema or extra-axial fluid collection. The ventricles and subarachnoid spaces are appropriately sized for age. There is no CT evidence of acute cortical infarction. There are stable chronic small vessel ischemic changes within the periventricular white matter. Intracranial vascular calcifications are noted.  There is interval improved aeration of the paranasal sinuses with some residual mucosal thickening, greatest in the left frontal sinus. There are no air-fluid levels. The mastoids and middle ears are clear. The calvarium is intact.  IMPRESSION: No acute intracranial findings. Stable chronic small vessel ischemic changes with interval improved paranasal sinus inflammation.   Electronically Signed   By: Roxy Horseman M.D.   On: 12/23/2014 21:54     EKG Interpretation None      MDM   Final diagnoses:   Dizziness    74 year old male with a history of atrial fibrillation on Coumadin, hypertension, hypercholesterolemia, CAD, ischemic cardiomyopathy, pacemaker defibrillator, AAA, iliac artery aneurysm presents with concern for "swimmy headedness."  Patient reports dizziness throughout the week however worsening today at 4:30 PM. [employer] pacemaker defibrillator was interrogated and showed no sign of arrhythmia.  Patient had normal CBC, normal electrolytes and normal i-STAT troponin. Denies infectious symptoms.  Symptoms are more described as vertigo than lightheadedness.  Neurologic exam was significant for pass pointing on the right, and poor coordination of the right hand, mild right sided weakness. Neurology was consulted for concern of stroke and feel that his history and exam is consistent with this. His INR is nearly therapeutic at 1.95. CT head showed no acute intracranial abnormality. He will be admitted to the hospitalist service for further stroke evaluation.   [name], MD 12/23/14 2357  Dione Booze, MD 12/24/14 [phone]

## 2014-12-24 ENCOUNTER — Inpatient Hospital Stay (HOSPITAL_COMMUNITY): Payer: Medicare Other

## 2014-12-24 ENCOUNTER — Encounter (HOSPITAL_COMMUNITY): Payer: Self-pay | Admitting: Internal Medicine

## 2014-12-24 DIAGNOSIS — Z7901 Long term (current) use of anticoagulants: Secondary | ICD-10-CM

## 2014-12-24 DIAGNOSIS — I480 Paroxysmal atrial fibrillation: Secondary | ICD-10-CM

## 2014-12-24 DIAGNOSIS — I5022 Chronic systolic (congestive) heart failure: Secondary | ICD-10-CM

## 2014-12-24 DIAGNOSIS — I6340 Cerebral infarction due to embolism of unspecified cerebral artery: Secondary | ICD-10-CM

## 2014-12-24 DIAGNOSIS — N289 Disorder of kidney and ureter, unspecified: Secondary | ICD-10-CM

## 2014-12-24 DIAGNOSIS — I4891 Unspecified atrial fibrillation: Secondary | ICD-10-CM

## 2014-12-24 DIAGNOSIS — R42 Dizziness and giddiness: Secondary | ICD-10-CM

## 2014-12-24 DIAGNOSIS — I255 Ischemic cardiomyopathy: Secondary | ICD-10-CM

## 2014-12-24 DIAGNOSIS — Z95810 Presence of automatic (implantable) cardiac defibrillator: Secondary | ICD-10-CM

## 2014-12-24 DIAGNOSIS — I10 Essential (primary) hypertension: Secondary | ICD-10-CM

## 2014-12-24 DIAGNOSIS — I714 Abdominal aortic aneurysm, without rupture: Secondary | ICD-10-CM

## 2014-12-24 LAB — LIPID PANEL
CHOL/HDL RATIO: 3.8 ratio
Cholesterol: 144 mg/dL (ref 0–200)
HDL: 38 mg/dL — ABNORMAL LOW (ref >39)
LDL CALC: 75 mg/dL (ref 0–99)
TRIGLYCERIDES: 154 mg/dL — AB (ref <150)
VLDL: 31 mg/dL (ref 0–40)

## 2014-12-24 LAB — HEMOGLOBIN A1C
HEMOGLOBIN A1C: 5.5 % (ref <5.7)
Mean Plasma Glucose: 111 mg/dL (ref <117)

## 2014-12-24 LAB — PROTIME-INR
INR: 1.82 — AB (ref 0.00–1.49)
PROTHROMBIN TIME: 21.3 s — AB (ref 11.6–15.2)

## 2014-12-24 MED ORDER — STROKE: EARLY STAGES OF RECOVERY BOOK
Freq: Once | Status: AC
  Administered 2014-12-24: 06:00:00

## 2014-12-24 MED ORDER — IOHEXOL 350 MG/ML SOLN
50.0000 mL | Freq: Once | INTRAVENOUS | Status: AC | PRN
  Administered 2014-12-24: 50 mL via INTRAVENOUS

## 2014-12-24 MED ORDER — WARFARIN SODIUM 5 MG PO TABS: 5.0000 mg | ORAL_TABLET | Freq: Every day | ORAL | Status: DC

## 2014-12-24 MED ORDER — WARFARIN SODIUM 5 MG PO TABS: 5.0000 mg | ORAL_TABLET | ORAL | Status: DC

## 2014-12-24 MED ORDER — AMIODARONE HCL 200 MG PO TABS
200.0000 mg | ORAL_TABLET | Freq: Every day | ORAL | Status: DC
  Administered 2014-12-24 – 2014-12-25 (×2): 200 mg via ORAL
  Filled 2014-12-24 (×2): qty 1

## 2014-12-24 MED ORDER — POTASSIUM CHLORIDE CRYS ER 20 MEQ PO TBCR
20.0000 meq | EXTENDED_RELEASE_TABLET | Freq: Every day | ORAL | Status: DC
  Administered 2014-12-24 – 2014-12-25 (×2): 20 meq via ORAL
  Filled 2014-12-24 (×2): qty 1

## 2014-12-24 MED ORDER — SENNOSIDES-DOCUSATE SODIUM 8.6-50 MG PO TABS
1.0000 | ORAL_TABLET | Freq: Every evening | ORAL | Status: DC | PRN
Start: 2014-12-24 — End: 2014-12-25

## 2014-12-24 MED ORDER — WARFARIN SODIUM 5 MG PO TABS
10.0000 mg | ORAL_TABLET | Freq: Once | ORAL | Status: AC
  Administered 2014-12-24: 10 mg via ORAL
  Filled 2014-12-24: qty 2

## 2014-12-24 MED ORDER — FUROSEMIDE 40 MG PO TABS: 40.0000 mg | ORAL_TABLET | Freq: Every day | ORAL | Status: DC | PRN

## 2014-12-24 MED ORDER — DOCUSATE SODIUM 100 MG PO CAPS
100.0000 mg | ORAL_CAPSULE | Freq: Two times a day (BID) | ORAL | Status: DC
  Administered 2014-12-24 – 2014-12-25 (×3): 100 mg via ORAL
  Filled 2014-12-24 (×3): qty 1

## 2014-12-24 MED ORDER — HYDRALAZINE HCL 25 MG PO TABS
25.0000 mg | ORAL_TABLET | Freq: Three times a day (TID) | ORAL | Status: DC
  Administered 2014-12-24 – 2014-12-25 (×5): 25 mg via ORAL
  Filled 2014-12-24 (×5): qty 1

## 2014-12-24 MED ORDER — WARFARIN - PHARMACIST DOSING INPATIENT
Freq: Every day | Status: DC
  Administered 2014-12-24: 18:00:00

## 2014-12-24 MED ORDER — HYDRALAZINE HCL 20 MG/ML IJ SOLN: 10.0000 mg | Freq: Four times a day (QID) | INTRAMUSCULAR | Status: DC | PRN

## 2014-12-24 MED ORDER — HYDRALAZINE HCL 20 MG/ML IJ SOLN: 5.0000 mg | Freq: Four times a day (QID) | INTRAMUSCULAR | Status: DC | PRN

## 2014-12-24 MED ORDER — WARFARIN SODIUM 7.5 MG PO TABS: 7.5000 mg | ORAL_TABLET | ORAL | Status: DC

## 2014-12-24 MED ORDER — AMLODIPINE BESYLATE 10 MG PO TABS
10.0000 mg | ORAL_TABLET | Freq: Every day | ORAL | Status: DC
  Administered 2014-12-24 – 2014-12-25 (×2): 10 mg via ORAL
  Filled 2014-12-24 (×2): qty 1

## 2014-12-24 MED ORDER — LISINOPRIL 20 MG PO TABS
40.0000 mg | ORAL_TABLET | Freq: Every day | ORAL | Status: DC
  Administered 2014-12-24 – 2014-12-25 (×2): 40 mg via ORAL
  Filled 2014-12-24 (×2): qty 2

## 2014-12-24 MED ORDER — METOPROLOL SUCCINATE ER 25 MG PO TB24
12.5000 mg | ORAL_TABLET | Freq: Every day | ORAL | Status: DC
  Administered 2014-12-24 – 2014-12-25 (×2): 12.5 mg via ORAL
  Filled 2014-12-24 (×2): qty 1

## 2014-12-24 MED ORDER — PRAVASTATIN SODIUM 40 MG PO TABS
80.0000 mg | ORAL_TABLET | Freq: Every day | ORAL | Status: DC
  Administered 2014-12-24 – 2014-12-25 (×2): 80 mg via ORAL
  Filled 2014-12-24 (×2): qty 2

## 2014-12-24 NOTE — Progress Notes (Signed)
Pt's IV was removed.  No bleeding observed. Catheter still intact. Attempted to restart IV but was unsuccessful. IV team consult order placed for CT procedure today.

## 2014-12-24 NOTE — Discharge Instructions (Addendum)
Follow with [name] Rossetti, MD in 5-7 days  Please get a complete blood count and chemistry panel checked by your Primary MD at your next visit, and again as instructed by your Primary MD. Please get your medications reviewed and adjusted by your Primary MD.  Please request your Primary MD to go over all Hospital Tests and Procedure/Radiological results at the follow up, please get all Hospital records sent to your Prim MD by signing hospital release before you go home.  If you had Pneumonia of Lung problems at the Hospital: Please get a 2 view Chest X ray done in 6-8 weeks after hospital discharge or sooner if instructed by your Primary MD.  If you have Congestive Heart Failure: Please call your Cardiologist or Primary MD anytime you have any of the following symptoms:  1) 3 pound weight gain in 24 hours or 5 pounds in 1 week  2) shortness of breath, with or without a dry hacking cough  3) swelling in the hands, feet or stomach  4) if you have to sleep on extra pillows at night in order to breathe  Follow cardiac low salt diet and 1.5 lit/day fluid restriction.  If you have diabetes Accuchecks 4 times/day, Once in AM empty stomach and then before each meal. Log in all results and show them to your primary doctor at your next visit. If any glucose reading is under 80 or above 300 call your primary MD immediately.  If you have Seizure/Convulsions/Epilepsy: Please do not drive, operate heavy machinery, participate in activities at heights or participate in high speed sports until you have seen by Primary MD or a Neurologist and advised to do so again.  If you had Gastrointestinal Bleeding: Please ask your Primary MD to check a complete blood count within one week of discharge or at your next visit. Your endoscopic/colonoscopic biopsies that are pending at the time of discharge, will also need to followed by your Primary MD.  Get Medicines reviewed and adjusted. Please take all your  medications with you for your next visit with your Primary MD  Please request your Primary MD to go over all hospital tests and procedure/radiological results at the follow up, please ask your Primary MD to get all Hospital records sent to his/her office.  If you experience worsening of your admission symptoms, develop shortness of breath, life threatening emergency, suicidal or homicidal thoughts you must seek medical attention immediately by calling 911 or calling your MD immediately  if symptoms less severe.  You must read complete instructions/literature along with all the possible adverse reactions/side effects for all the Medicines you take and that have been prescribed to you. Take any new Medicines after you have completely understood and accpet all the possible adverse reactions/side effects.   Do not drive or operate heavy machinery when taking Pain medications.   Do not take more than prescribed Pain, Sleep and Anxiety Medications  Special Instructions: If you have smoked or chewed Tobacco  in the last 2 yrs please stop smoking, stop any regular Alcohol  and or any Recreational drug use.  Wear Seat belts while driving.  Please note You were cared for by a hospitalist during your hospital stay. If you have any questions about your discharge medications or the care you received while you were in the hospital after you are discharged, you can call the unit and asked to speak with the hospitalist on call if the hospitalist that took care of you is not available. Once you  are discharged, your primary care physician will handle any further medical issues. Please note that NO REFILLS for any discharge medications will be authorized once you are discharged, as it is imperative that you return to your primary care physician (or establish a relationship with a primary care physician if you do not have one) for your aftercare needs so that they can reassess your need for medications and monitor your  lab values.  You can reach the hospitalist office at phone [phone] or fax [phone]   If you do not have a primary care physician, you can call [phone] for a physician referral.  Activity: As tolerated with Full fall precautions use walker/cane & assistance as needed  Diet: heart healthy  Disposition Home

## 2014-12-24 NOTE — H&P (Signed)
[facility] Admission History and Physical       [name] [name] ZOX:[id number] DOB: 1940/12/13 DOA: 12/23/2014  Referring physician: EDP PCP: [name], MD  Specialists:   Chief Complaint: Right Sided Weakness and Dizziness  HPI: [name] [name] is a 74 y.o. male  With a history of CAD S/P AICD/Pacemaker, Ischemic Cardiomyopathy /Systolic CHF with an EF of 35%,  Atrial Fibrillation on Coumadin Rx, HTN, CRI , and an AAA which is under surveillance who presents to the ED with complaints of dizziness and right sided weakness x 1 week.  He denies any headache, chest pain, or syncope.   He reports that he has been listing to this right .    His daughter reports that he has had some difficulty with his speech.  In the ED a CT scan of the head which was negative for acute findings.    He was seen by Neurology Dr. Amada Jupiter and was referred for further evaluation.     Review of Systems:  Constitutional: No Weight Loss, No Weight Gain, Night Sweats, Fevers, Chills, Dizziness, Fatigue, or Generalized Weakness HEENT: No Headaches, Difficulty Swallowing,Tooth/Dental Problems,Sore Throat,  No Sneezing, Rhinitis, Ear Ache, Nasal Congestion, or Post Nasal Drip,  Cardio-vascular:  No Chest pain, Orthopnea, PND, Edema in Lower Extremities, Anasarca, Dizziness, Palpitations  Resp: No Dyspnea, No DOE, No Productive Cough, No Non-Productive Cough, No Hemoptysis, No Wheezing.    GI: No Heartburn, Indigestion, Abdominal Pain, Nausea, Vomiting, Diarrhea, Hematemesis, Hematochezia, Melena, Change in Bowel Habits,  Loss of Appetite  GU: No Dysuria, Change in Color of Urine, No Urgency or Frequency, No Flank pain.  Musculoskeletal: No Joint Pain or Swelling, No Decreased Range of Motion, No Back Pain.  Neurologic: No Syncope, No Seizures, +Right Sided Weakness, Paresthesia, No Vision Disturbance or Loss, No Diplopia, No Vertigo, +Difficulty Walking,  Skin: No Rash or Lesions. Psych: No Change in  Mood or Affect, No Depression or Anxiety, No Memory loss, No Confusion, or Hallucinations   Past Medical History  Diagnosis Date  . Atrial fibrillation     s/p prior DCCV;  Amiodarone Rx.  . CAD (coronary artery disease)     s/p CABG;  cath 10/07:  LM ok, LAD occluded, AV CFX 95%, pRCA occluded; L-LAD, S-OM2/OM3, S-PDA ok  . Cerebrovascular disease     s/p prior Left CEA;  followed by Dr. [name]  . COPD (chronic obstructive pulmonary disease)   . Hyperlipidemia   . Hypertension   . Mitral insufficiency     hx of. not noted on echo 01/2012  . Nephrolithiasis   . Ischemic cardiomyopathy     echo 5/09: EF 55%, mild LAE;  Myoview 11/10: inf and apical scar with mild peri-infarct ischemia, EF 44%. Echo 01/26/12 with EF 20-25% but previously known to be 30% on echoes before last one  . Iliac artery aneurysm, left     followed by Dr. [name]  . Aortic dissection     H/O focal aortic dissection  . LBBB (left bundle branch block)   . CHF (congestive heart failure)   . Myocardial infarction   . Renal insufficiency     01/2012  . Stroke       Past Surgical History  Procedure Laterality Date  . Uteroscopy    . Coronary artery bypass graft  1998  . Left cea  07/05  . Appendectomy  09/11/11  . Carotid endarterectomy  2005    left  . Biventricular defibrillator implantation  08/22/12  SJM Quadra Assura BiV ICD implanted by Dr [name]  . Spine surgery  Sep 27, 2014    Lower Back L 4  and   L 5  . Bi-ventricular implantable cardioverter defibrillator N/A 08/22/2012    Procedure: BI-VENTRICULAR IMPLANTABLE CARDIOVERTER DEFIBRILLATOR  (CRT-D);  Surgeon: [name], MD;  Location: [facility] CATH LAB;  Service: Cardiovascular;  Laterality: N/A;       Prior to Admission medications   Medication Sig Start Date End Date Taking? Authorizing Provider  albuterol (PROVENTIL HFA;VENTOLIN HFA) 108 (90 BASE) MCG/ACT inhaler Inhale 2 puffs into the lungs every 4 (four) hours as needed for wheezing. 03/29/13   Yes Suzi Roots, MD  amiodarone (PACERONE) 200 MG tablet TAKE 1 TABLET EVERY DAY Patient taking differently: TAKE 1 TABLET BY MOUTH EVERY DAY 08/24/14  Yes Lewayne Bunting, MD  amLODipine (NORVASC) 10 MG tablet Take 1 tablet (10 mg total) by mouth daily. 04/30/14  Yes Lewayne Bunting, MD  docusate sodium (COLACE) 100 MG capsule Take 1 capsule (100 mg total) by mouth every 12 (twelve) hours. 06/04/14  Yes Rolland Porter, MD  furosemide (LASIX) 40 MG tablet Take 40 mg by mouth daily as needed for fluid or edema.  08/29/13  Yes Lewayne Bunting, MD  hydrALAZINE (APRESOLINE) 50 MG tablet Take 0.5 tablets (25 mg total) by mouth 3 (three) times daily. 12/15/14  Yes Lewayne Bunting, MD  ipratropium-albuterol (DUONEB) 0.5-2.5 (3) MG/3ML SOLN Take 3 mLs by nebulization every 4 (four) hours as needed (for shortness of breath).   Yes Historical Provider, MD  lisinopril (PRINIVIL,ZESTRIL) 40 MG tablet Take 40 mg by mouth daily.   Yes Historical Provider, MD  magnesium citrate SOLN Take 296 mLs (1 Bottle total) by mouth once. 06/04/14  Yes Rolland Porter, MD  metoprolol succinate (TOPROL-XL) 25 MG 24 hr tablet Take 1 tablet (25 mg total) by mouth daily. Patient taking differently: Take 12.5 mg by mouth daily.  12/15/14  Yes Lewayne Bunting, MD  Multiple Vitamin (MULITIVITAMIN WITH MINERALS) TABS Take 1 tablet by mouth daily.   Yes Historical Provider, MD  naproxen sodium (ALEVE) 220 MG tablet Take 440 mg by mouth 2 (two) times daily as needed (for pain).   Yes Historical Provider, MD  niacin (NIASPAN) 500 MG CR tablet Take 1,000 mg by mouth 2 (two) times daily.    Yes Historical Provider, MD  potassium chloride SA (K-DUR,KLOR-CON) 20 MEQ tablet TAKE 2 TABLETS THREE TIMES DAILY 11/11/14  Yes Lewayne Bunting, MD  pravastatin (PRAVACHOL) 80 MG tablet Take 1 tablet (80 mg total) by mouth daily. 07/24/14  Yes Lewayne Bunting, MD  warfarin (COUMADIN) 5 MG tablet Take as directed Patient taking differently: Take 5-7.5 mg by  mouth daily at 6 PM. Patient takes 5 mg on Tuesday and Saturday and other days patient takes 7.5 mg 10/23/14  Yes Lewayne Bunting, MD      No Known Allergies   Social History:  reports that he quit smoking about 10 years ago. His smoking use included Cigarettes. He has a 85.5 pack-year smoking history. He has never used smokeless tobacco. He reports that he does not drink alcohol or use illicit drugs.     Family History  Problem Relation Age of Onset  . Stroke Maternal Uncle        Physical Exam:  GEN:  Pleasant Elderly Obese  74 y.o. Caucasain male examined  and in no acute distress; cooperative with exam Filed Vitals:  12/23/14 2315 12/24/14 0000 12/24/14 0034 12/24/14 0100  BP: 152/74 175/70 136/99   Pulse: 63 65 66   Temp:   98.7 F (37.1 C) 98.7 F (37.1 C)  TempSrc:   Oral   Resp: 11 9 18    Height:      Weight:      SpO2: 94% 95% 97%    Blood pressure 136/99, pulse 66, temperature 98.7 F (37.1 C), temperature source Oral, resp. rate 18, height 6\' 1"  (1.854 m), weight 112.492 kg (248 lb), SpO2 97 %. PSYCH: He is alert and oriented x4; does not appear anxious does not appear depressed; affect is normal HEENT: Normocephalic and Atraumatic, Mucous membranes pink; PERRLA; EOM intact; Fundi:  Benign;  No scleral icterus, Nares: Patent, Oropharynx: Clear,    Neck:  FROM, No Cervical Lymphadenopathy nor Thyromegaly or Carotid Bruit; No JVD; Breasts:: Not examined CHEST WALL: No tenderness CHEST: Normal respiration, clear to auscultation bilaterally HEART: Regular rate and rhythm; no murmurs rubs or gallops BACK: No kyphosis or scoliosis; No CVA tenderness ABDOMEN: Positive Bowel Sounds, Obese, Soft Non-Tender; No Masses, No Organomegaly, No Pannus; No Intertriginous candida. Rectal Exam: Not done EXTREMITIES: No Cyanosis, Clubbing, or Edema; No Ulcerations. Genitalia: not examined PULSES: 2+ and symmetric SKIN: Normal hydration no rash or ulceration CNS:   Mental  Status:  Alert, Oriented, Thought Content Appropriate. Speech Fluent without evidence of Aphasia. Able to follow 3 step commands without difficulty.  In No obvious pain.   Cranial Nerves:  II: Discs flat bilaterally; Visual fields Intact, Pupils equal and reactive.    III,IV, VI: Extra-ocular motions intact bilaterally    V,VII: smile symmetric, facial light touch sensation normal bilaterally    VIII: hearing intact or decreasesd bilaterally    IX,X: gag reflex present    XI: bilateral shoulder shrug    XII: midline tongue extension   Motor:  Right:  Upper extremity 4/5     Left:  Upper extremity 5/5     Right:  Lower extremity 4/5    Left:  Lower extremity 5/5     Tone and Bulk:  normal tone throughout; no atrophy noted   Sensory:  Pinprick and light touch intact throughout, bilaterally   Deep Tendon Reflexes: 2+ and symmetric throughout   Plantars/ Babinski:  Right: equivocal       Left: equivocal     Cerebellar:  Finger to nose without difficulty.   Gait: deferred   Vascular: pulses palpable throughout    Labs on Admission:  Basic Metabolic Panel:  Recent Labs Lab 12/23/14 1948  NA 145  K 3.5  CL 107  CO2 22  GLUCOSE 99  BUN 18  CREATININE 1.24  CALCIUM 9.1   Liver Function Tests:  Recent Labs Lab 12/23/14 1948  AST 21  ALT 16  ALKPHOS 96  BILITOT 0.4  PROT 6.2  ALBUMIN 3.8   No results for input(s): LIPASE, AMYLASE in the last 168 hours. No results for input(s): AMMONIA in the last 168 hours. CBC:  Recent Labs Lab 12/23/14 1948  WBC 8.2  NEUTROABS 4.1  HGB 15.4  HCT 46.0  MCV 91.1  PLT 171   Cardiac Enzymes: No results for input(s): CKTOTAL, CKMB, CKMBINDEX, TROPONINI in the last 168 hours.  BNP (last 3 results) No results for input(s): PROBNP in the last 8760 hours. CBG: No results for input(s): GLUCAP in the last 168 hours.  Radiological Exams on Admission: Ct Head Wo Contrast  12/23/2014   CLINICAL DATA:  Dizziness with right-sided  weakness. Initial encounter.  EXAM: CT HEAD WITHOUT CONTRAST  TECHNIQUE: Contiguous axial images were obtained from the base of the skull through the vertex without intravenous contrast.  COMPARISON:  Head CT 01/14/2013 and 01/13/2013.  FINDINGS: There is no evidence of acute intracranial hemorrhage, mass lesion, brain edema or extra-axial fluid collection. The ventricles and subarachnoid spaces are appropriately sized for age. There is no CT evidence of acute cortical infarction. There are stable chronic small vessel ischemic changes within the periventricular white matter. Intracranial vascular calcifications are noted.  There is interval improved aeration of the paranasal sinuses with some residual mucosal thickening, greatest in the left frontal sinus. There are no air-fluid levels. The mastoids and middle ears are clear. The calvarium is intact.  IMPRESSION: No acute intracranial findings. Stable chronic small vessel ischemic changes with interval improved paranasal sinus inflammation.   Electronically Signed   By: Roxy Horseman M.D.   On: 12/23/2014 21:54     EKG: Independently reviewed.    Assessment/Plan:   74 y.o. male with  Principal Problem:   1.   CVA (cerebral infarction)/Stroke-   Telemetry Monitoring   Neurology Dr Amada Jupiter saw in ED   Neuro Checks   Repeat CT scan and CTA of Head and Neck in AM    (Pt can not have an MRI due to his AICD./Pacer)   ASA Rx   2D ECHO in AM   Check Fasting Lipid Panel and HbA1c in AM   PT/OT/Speech Therapy evaluations in AM  Active Problems:      2.    HYPERTENSION   Continue Hydralazine, Lasix, Metoprolol, Amlodipine, and     3.    Cardiomyopathy, ischemic     4.    Atrial fibrillation   Continue Amiodarone, and Metoprolol, and Coumadin Rx     5.    Long term current use of anticoagulant   Pharmacy adjustement of Coumadin rx   Monitor PT/INR daily    6.    Abdominal aortic aneurysm   Under Surveillance     7.    Chronic systolic  heart failure   On Lasix, Metoprolol, Lisinopril Rx   Monitor strict  I/Os     8.    Automatic implantable cardioverter-defibrillator in situ   stable     9.    Renal insufficiency   Monitor Trend of BUN/Cr     10.  DVT Prophylaxis   On Coumadin Rx    Code Status:  FULL CODE     Family Communication:   Daughter at Bedside Disposition Plan:      Inpatient Telemetry Unit   Time spent:  32 Minutes  Ron Parker [facility] Pager [phone]   If 7AM -7PM Please Contact the Day Rounding Team MD for [facility]  If 7PM-7AM, Please Contact Night-Floor Coverage  [email or web address] Password [email or web address] 12/24/2014, 5:06 AM

## 2014-12-24 NOTE — Progress Notes (Signed)
ANTICOAGULATION CONSULT NOTE - Follow-up  Pharmacy Consult for warfarin Indication: atrial fibrillation  No Known Allergies  Patient Measurements: Height: 6\' 1"  (185.4 cm) Weight: 248 lb (112.492 kg) IBW/kg (Calculated) : 79.9  Vital Signs: Temp: 98.9 F (37.2 C) (01/14 0608) Temp Source: Oral (01/14 0034) BP: 163/77 mmHg (01/14 0608) Pulse Rate: 60 (01/14 0608)  Labs:  Recent Labs  12/23/14 1948 12/24/14 0714  HGB 15.4  --   HCT 46.0  --   PLT 171  --   LABPROT 22.4* 21.3*  INR 1.95* 1.82*  CREATININE 1.24  --     Estimated Creatinine Clearance: 70.8 mL/min (by C-G formula based on Cr of 1.24).  Medications:  Prescriptions prior to admission  Medication Sig Dispense Refill Last Dose  . albuterol (PROVENTIL HFA;VENTOLIN HFA) 108 (90 BASE) MCG/ACT inhaler Inhale 2 puffs into the lungs every 4 (four) hours as needed for wheezing. 1 Inhaler 0 Past Week at Unknown time  . amiodarone (PACERONE) 200 MG tablet TAKE 1 TABLET EVERY DAY (Patient taking differently: TAKE 1 TABLET BY MOUTH EVERY DAY) 90 tablet 2 12/22/2014 at Unknown time  . amLODipine (NORVASC) 10 MG tablet Take 1 tablet (10 mg total) by mouth daily. 90 tablet 4 12/23/2014 at Unknown time  . docusate sodium (COLACE) 100 MG capsule Take 1 capsule (100 mg total) by mouth every 12 (twelve) hours. 60 capsule 0 12/23/2014 at Unknown time  . furosemide (LASIX) 40 MG tablet Take 40 mg by mouth daily as needed for fluid or edema.    12/23/2014 at Unknown time  . hydrALAZINE (APRESOLINE) 50 MG tablet Take 0.5 tablets (25 mg total) by mouth 3 (three) times daily. 135 tablet 3 12/23/2014 at 7p  . ipratropium-albuterol (DUONEB) 0.5-2.5 (3) MG/3ML SOLN Take 3 mLs by nebulization every 4 (four) hours as needed (for shortness of breath).   12/23/2014 at Unknown time  . lisinopril (PRINIVIL,ZESTRIL) 40 MG tablet Take 40 mg by mouth daily.   12/23/2014 at Unknown time  . magnesium citrate SOLN Take 296 mLs (1 Bottle total) by mouth once.  195 mL 0 12/23/2014 at Unknown time  . metoprolol succinate (TOPROL-XL) 25 MG 24 hr tablet Take 1 tablet (25 mg total) by mouth daily. (Patient taking differently: Take 12.5 mg by mouth daily. ) 45 tablet 3 12/23/2014 at 8a  . Multiple Vitamin (MULITIVITAMIN WITH MINERALS) TABS Take 1 tablet by mouth daily.   12/23/2014 at Unknown time  . naproxen sodium (ALEVE) 220 MG tablet Take 440 mg by mouth 2 (two) times daily as needed (for pain).   12/23/2014 at Unknown time  . niacin (NIASPAN) 500 MG CR tablet Take 1,000 mg by mouth 2 (two) times daily.    12/23/2014 at Unknown time  . potassium chloride SA (K-DUR,KLOR-CON) 20 MEQ tablet TAKE 2 TABLETS THREE TIMES DAILY 540 tablet 0 12/23/2014 at Unknown time  . pravastatin (PRAVACHOL) 80 MG tablet Take 1 tablet (80 mg total) by mouth daily. 90 tablet 2 12/23/2014 at Unknown time  . warfarin (COUMADIN) 5 MG tablet Take as directed (Patient taking differently: Take 5-7.5 mg by mouth daily at 6 PM. Patient takes 5 mg on Tuesday and Saturday and other days patient takes 7.5 mg) 90 tablet 3 12/23/2014   Assessment: 73 yom presented to the hospital with weakness and unsteadiness. He is on chronic coumadin for hx of afib. INR is low at  1.82, CBC is WNL as of 1/13. No bleeding noted.   Goal of Therapy:  INR  2-3   Plan:  1. Warfarin  PO x 1 tonight 2. F/u AM INR  Lysle Pearl, PharmD, BCPS Pager # [phone] 12/24/2014 8:22 AM

## 2014-12-24 NOTE — Progress Notes (Signed)
CARE MANAGEMENT NOTE 12/24/2014  Patient:  [name] [name], [name] [name]   Account Number:  [id number]  Date Initiated:  12/24/2014  Documentation initiated by:  Jiles Crocker  Subjective/Objective Assessment:   ADMITTED FOR STROKE WORKUP     Action/Plan:   CM FOLLOWING FOR DCP   Anticipated DC Date:  12/28/2014   Anticipated DC Plan:  AWAITING FOR PT/OT EVALS FOR DISPOSITION NEEDS     DC Planning Services  CM consult             Status of service:  In process, will continue to follow Medicare Important Message given?   (If response is "NO", the following Medicare IM given date fields will be blank)  Per UR Regulation:  Reviewed for med. necessity/level of care/duration of stay  Comments:  1/14/2016Abelino [name] RN,BSN,MHA [phone]

## 2014-12-24 NOTE — Progress Notes (Signed)
STROKE TEAM PROGRESS NOTE   HISTORY [name] [name] is a 74 y.o. male with a history of hypertension, atrial fibrillation currently on warfarin therapy. He presents with weakness and unsteadiness. He states that he has had some "dizziness" for about a week, but that night the got much worse and he has now noticed right-sided discoordination as well. He presented to the emergency room tonight, given that he was having difficulty walking and noticed that his blood pressure was in the 190s. He was LKW One week ago. Patient was not administered TPA secondary to delay in arrival. He was admitted for further evaluation and treatment.   SUBJECTIVE (INTERVAL HISTORY) His ?daughter is at the bedside.  Overall he feels his condition is stable. She reports he has been depressed since the death of his wife. He presented with dizziness, nausea, vertigo and gait ataxia with right sided paresthesias for several days.   OBJECTIVE Temp:  [98 F (36.7 C)-100 F (37.8 C)] 98.6 F (37 C) (01/14 0945) Pulse Rate:  [60-70] 63 (01/14 0945) Cardiac Rhythm:  [-] A-V Sequential paced (01/14 0028) Resp:  [9-20] 18 (01/14 0945) BP: (136-185)/(60-108) 180/78 mmHg (01/14 0945) SpO2:  [93 %-98 %] 97 % (01/14 0945) Weight:  [112.492 kg (248 lb)] 112.492 kg (248 lb) (01/13 1759)  No results for input(s): GLUCAP in the last 168 hours.  Recent Labs Lab 12/23/14 1948  NA 145  K 3.5  CL 107  CO2 22  GLUCOSE 99  BUN 18  CREATININE 1.24  CALCIUM 9.1    Recent Labs Lab 12/23/14 1948  AST 21  ALT 16  ALKPHOS 96  BILITOT 0.4  PROT 6.2  ALBUMIN 3.8    Recent Labs Lab 12/23/14 1948  WBC 8.2  NEUTROABS 4.1  HGB 15.4  HCT 46.0  MCV 91.1  PLT 171   No results for input(s): CKTOTAL, CKMB, CKMBINDEX, TROPONINI in the last 168 hours.  Recent Labs  12/23/14 1948 12/24/14 0714  LABPROT 22.4* 21.3*  INR 1.95* 1.82*   No results for input(s): COLORURINE, LABSPEC, PHURINE, GLUCOSEU, HGBUR, BILIRUBINUR,  KETONESUR, PROTEINUR, UROBILINOGEN, NITRITE, LEUKOCYTESUR in the last 72 hours.  Invalid input(s): APPERANCEUR     Component Value Date/Time   CHOL 144 12/24/2014 0333   TRIG 154* 12/24/2014 0333   HDL 38* 12/24/2014 0333   CHOLHDL 3.8 12/24/2014 0333   VLDL 31 12/24/2014 0333   LDLCALC 75 12/24/2014 0333   Lab Results  Component Value Date   HGBA1C 5.6 01/14/2013      Component Value Date/Time   LABOPIA NONE DETECTED 01/14/2013 0417   COCAINSCRNUR NONE DETECTED 01/14/2013 0417   LABBENZ NONE DETECTED 01/14/2013 0417   AMPHETMU NONE DETECTED 01/14/2013 0417   THCU NONE DETECTED 01/14/2013 0417   LABBARB NONE DETECTED 01/14/2013 0417    No results for input(s): ETH in the last 168 hours.  Ct Head Wo Contrast  12/23/2014   CLINICAL DATA:  Dizziness with right-sided weakness. Initial encounter.  EXAM: CT HEAD WITHOUT CONTRAST  TECHNIQUE: Contiguous axial images were obtained from the base of the skull through the vertex without intravenous contrast.  COMPARISON:  Head CT 01/14/2013 and 01/13/2013.  FINDINGS: There is no evidence of acute intracranial hemorrhage, mass lesion, brain edema or extra-axial fluid collection. The ventricles and subarachnoid spaces are appropriately sized for age. There is no CT evidence of acute cortical infarction. There are stable chronic small vessel ischemic changes within the periventricular white matter. Intracranial vascular calcifications are noted.  There  is interval improved aeration of the paranasal sinuses with some residual mucosal thickening, greatest in the left frontal sinus. There are no air-fluid levels. The mastoids and middle ears are clear. The calvarium is intact.  IMPRESSION: No acute intracranial findings. Stable chronic small vessel ischemic changes with interval improved paranasal sinus inflammation.   Electronically Signed   By: Roxy Horseman M.D.   On: 12/23/2014 21:54     PHYSICAL EXAM Pleasant obese Caucasian male not in  distress.Awake alert. Afebrile. Head is nontraumatic. Neck is supple without bruit. Hearing is normal. Cardiac exam no murmur or gallop. Lungs are clear to auscultation. Distal pulses are well felt. Neurological Exam : Awake alert oriented 3 with normal speech and language function. No aphasia or apraxia dysarthria. Fundi were not visualized. Vision acuity and fields appear adequate. Face is symmetric without weakness. Tongue is midline. Motor system exam revealed no upper or lower extremity drift but mild weakness of right grip and intrinsic hand muscles. Orbits left over right upper extremity. Subjective diminished touch and pinprick sensation on the right hemibody including forehead. Deep tendon reflexes are 2+ symmetric. Coordination is slow but accurate. Plantars are downgoing. Gait was not tested. ASSESSMENT/PLAN [name] is a 74 y.o. male with history of CAD S/P AICD/Pacemaker, Ischemic Cardiomyopathy /Systolic CHF with an EF of 35%, Atrial Fibrillation on Coumadin Rx, HTN, CRI , and an AAA which is under surveillance who presents to the ED with complaints of dizziness and right sided weakness x 1 week. He did not receive IV t-PA  due to delay in arrival.   Probable posterior circulaton Stroke, workup underway  Resultant  Dizziness and right hemiparesis/sensory loss improving  MRI  / MRA  ICD  CT angio head and neck pending  Carotid Doppler  Done Nov 2015 - R ICA < 40%, L IVA patent s/ hx CEA  2D Echo  pending   Warfarin for VTE prophylaxis  Diet Heart thin liquids  warfarin prior to admission, now on warfarin  Patient counseled to be compliant with his antithrombotic medications  Ongoing aggressive stroke risk factor management  Therapy recommendations:  pending   Disposition:  pending   Atrial Fibrillation  Home meds:  Coumadin, continued in the hospital  INR 1.95 on admission   Hypertension  Permissive hypertension (OK if < 220/120) but gradually  normalize in 5-7 days  Hyperlipidemia  Home meds:  pravachol 80 , resumed in hospital  LDL 75, goal < 70  Continue statin at discharge  Diabetes  HgbA1c pending goal < 7.0  Other Stroke Risk Factors  Advanced age  Former smoker, quit 10 years ago  Obesity, Body mass index is 32.73 kg/(m^2).   Hx stroke/TIA  Family hx stroke (uncle)  Coronary artery disease - CABG, MII  S/p L CEA by Dr. [name]  Other Active Problems  Iliac artery aneurysm followed by Dr. [name]  Ischemic cardiomyopathy  Chronic systolic heart failure  Renal insufficiency  Other Pertinent History  Back surgery with resultant loss on sensation R leg   Hospital day # 1  Rhoderick Moody [facility] See Amion for Pager information 12/24/2014 11:57 AM  I have personally examined this patient, reviewed notes, independently viewed imaging studies, participated in medical decision making and plan of care. I have made any additions or clarifications directly to the above note. Agree with note above. I think has a small brainstem/posterior circulation infarct not visualized on the CT scan. Unable to obtain MRI and hence  we'll repeat CT scan. Continue stroke workup. Therapy consults. Discussed with patient and daughter and answered questions.  Delia Heady, MD Medical Director [facility] Pager: [phone] 12/24/2014 12:52 PM    To contact Stroke Continuity provider, please refer to [email or web address]. After hours, contact General Neurology

## 2014-12-24 NOTE — Progress Notes (Signed)
PROGRESS NOTE  [name] [name] ZOX:[id number] DOB: [birth year] DOA: 12/23/2014 PCP: [name] Rossetti, MD  HPI: [name] [name] is a 74 y.o. male With a history of CAD S/P AICD/Pacemaker, Ischemic Cardiomyopathy /Systolic CHF with an EF of 35%, Atrial Fibrillation on Coumadin Rx, HTN, CRI , and an AAA which is under surveillance who presents to the ED with complaints of dizziness and right sided weakness x 1 week. He denies any headache, chest pain, or syncope. He reports that he has been listing to this right . His daughter reports that he has had some difficulty with his speech. In the ED a CT scan of the head which was negative for acute findings. He was seen by Neurology Dr. Amada Jupiter and was referred for further evaluation.   Subjective / 24 H Interval events He feels well this morning, still weak on right  Assessment/Plan: Principal Problem:   CVA (cerebral infarction) Active Problems:   HYPERTENSION, BENIGN   Cardiomyopathy, ischemic   Atrial fibrillation   Long term current use of anticoagulant   Abdominal aortic aneurysm   Chronic systolic heart failure   Automatic implantable cardioverter-defibrillator in situ   Stroke   Renal insufficiency   CVA (cerebral infarction)/Stroke - CT with contrast pending, cannot obtain MRI - neurology following - 2D echo, carotid dopplers, lipid panel, A1C  HTN - continue home medications  Cardiomyopathy, ischemic  Atrial fibrillation - Continue Amiodarone, and Metoprolol, and Coumadin Rx  Long term current use of anticoagulant - coumadin per pharmacy   Abdominal aortic aneurysm - monitored as an outpatient  Chronic systolic heart failure - On Lasix, Metoprolol, Lisinopril Rx - Monitor strict I/Os  Atomatic implantable cardioverter-defibrillator in situ  Renal insufficiency - monitor, Cr at baseline  COPD - stable, no wheezing    Diet: Diet Heart Fluids: none DVT Prophylaxis: Coumadin  Code Status: Full  Code Family Communication: d/w family bedside  Disposition Plan: remain inpatient  Consultants:  Neurology   Procedures:  None    Antibiotics  Anti-infectives    None       Studies  Ct Head Wo Contrast  12/23/2014   CLINICAL DATA:  Dizziness with right-sided weakness. Initial encounter.  EXAM: CT HEAD WITHOUT CONTRAST  TECHNIQUE: Contiguous axial images were obtained from the base of the skull through the vertex without intravenous contrast.  COMPARISON:  Head CT 01/14/2013 and 01/13/2013.  FINDINGS: There is no evidence of acute intracranial hemorrhage, mass lesion, brain edema or extra-axial fluid collection. The ventricles and subarachnoid spaces are appropriately sized for age. There is no CT evidence of acute cortical infarction. There are stable chronic small vessel ischemic changes within the periventricular white matter. Intracranial vascular calcifications are noted.  There is interval improved aeration of the paranasal sinuses with some residual mucosal thickening, greatest in the left frontal sinus. There are no air-fluid levels. The mastoids and middle ears are clear. The calvarium is intact.  IMPRESSION: No acute intracranial findings. Stable chronic small vessel ischemic changes with interval improved paranasal sinus inflammation.   Electronically Signed   By: Roxy Horseman M.D.   On: 12/23/2014 21:54    Objective  Filed Vitals:   12/24/14 0100 12/24/14 0230 12/24/14 0430 12/24/14 0608  BP:  142/60 185/92 163/77  Pulse:  67 70 60  Temp: 98.7 F (37.1 C) 98.6 F (37 C) 100 F (37.8 C) 98.9 F (37.2 C)  TempSrc:      Resp:      Height:  Weight:      SpO2:  97% 93% 94%    Intake/Output Summary (Last 24 hours) at 12/24/14 0847 Last data filed at 12/24/14 0700  Gross per 24 hour  Intake    240 ml  Output    400 ml  Net   -160 ml   Filed Weights   12/23/14 1759  Weight: 112.492 kg (248 lb)    Exam:  General:  NAD  HEENT: no scleral  icterus  Cardiovascular: RRR without murmurs  Respiratory: CTA biL, decreased breath sounds overall, no wheezing  Abdomen: soft, non tender  MSK/Extremities: no clubbing/cyanosis  Skin: no rashes  Neuro: 5-/5 on right, 5/5 on left   Data Reviewed: Basic Metabolic Panel:  Recent Labs Lab 12/23/14 1948  NA 145  K 3.5  CL 107  CO2 22  GLUCOSE 99  BUN 18  CREATININE 1.24  CALCIUM 9.1   Liver Function Tests:  Recent Labs Lab 12/23/14 1948  AST 21  ALT 16  ALKPHOS 96  BILITOT 0.4  PROT 6.2  ALBUMIN 3.8   CBC:  Recent Labs Lab 12/23/14 1948  WBC 8.2  NEUTROABS 4.1  HGB 15.4  HCT 46.0  MCV 91.1  PLT 171   Scheduled Meds: . amiodarone  200 mg Oral Daily  . amLODipine  10 mg Oral Daily  . docusate sodium  100 mg Oral BID  . hydrALAZINE  25 mg Oral 3 times per day  . lisinopril  40 mg Oral Daily  . metoprolol succinate  12.5 mg Oral Daily  . potassium chloride SA  20 mEq Oral Daily  . pravastatin  80 mg Oral Daily  . warfarin  10 mg Oral ONCE-1800  . Warfarin - Pharmacist Dosing Inpatient   Does not apply q1800   Continuous Infusions:   Pamella Pert, MD [facility] Pager [phone]. If 7 PM - 7 AM, please contact night-coverage at [email or web address], password [facility] 12/24/2014, 8:47 AM  LOS: 1 day

## 2014-12-24 NOTE — Progress Notes (Signed)
ANTICOAGULATION CONSULT NOTE - Initial Consult  Pharmacy Consult for warfarin Indication: atrial fibrillation  No Known Allergies  Patient Measurements: Height:  (185.4 cm) Weight: 248 lb (112.492 kg) IBW/kg (Calculated) : 79.9 Heparin Dosing Weight:   Vital Signs: Temp: 98.7 F (37.1 C) (01/14 0034) Temp Source: Oral (01/14 0034) BP: 136/99 mmHg (01/14 0034) Pulse Rate: 66 (01/14 0034)  Labs:  Recent Labs  12/23/14 1948  HGB 15.4  HCT 46.0  PLT 171  LABPROT 22.4*  INR 1.95*  CREATININE 1.24    Estimated Creatinine Clearance: 70.8 mL/min (by C-G formula based on Cr of 1.24).   Medical History: Past Medical History  Diagnosis Date  . Atrial fibrillation     s/p prior DCCV;  Amiodarone Rx.  . CAD (coronary artery disease)     s/p CABG;  cath 10/07:  LM ok, LAD occluded, AV CFX 95%, pRCA occluded; L-LAD, S-OM2/OM3, S-PDA ok  . Cerebrovascular disease     s/p prior Left CEA;  followed by Dr. [name]  . COPD (chronic obstructive pulmonary disease)   . Hyperlipidemia   . Hypertension   . Mitral insufficiency     hx of. not noted on echo 01/2012  . Nephrolithiasis   . Ischemic cardiomyopathy     echo 5/09: EF 55%, mild LAE;  Myoview 11/10: inf and apical scar with mild peri-infarct ischemia, EF 44%. Echo 01/26/12 with EF 20-25% but previously known to be 30% on echoes before last one  . Iliac artery aneurysm, left     followed by Dr. [name]  . Aortic dissection     H/O focal aortic dissection  . LBBB (left bundle branch block)   . CHF (congestive heart failure)   . Myocardial infarction   . Renal insufficiency     01/2012  . Stroke     Medications:  Prescriptions prior to admission  Medication Sig Dispense Refill Last Dose  . albuterol (PROVENTIL HFA;VENTOLIN HFA) 108 (90 BASE) MCG/ACT inhaler Inhale 2 puffs into the lungs every 4 (four) hours as needed for wheezing. 1 Inhaler 0 Past Week at Unknown time  . amiodarone (PACERONE) 200 MG tablet TAKE 1 TABLET  EVERY DAY (Patient taking differently: TAKE 1 TABLET BY MOUTH EVERY DAY) 90 tablet 2 12/22/2014 at Unknown time  . amLODipine (NORVASC) 10 MG tablet Take 1 tablet (10 mg total) by mouth daily. 90 tablet 4 12/23/2014 at Unknown time  . docusate sodium (COLACE) 100 MG capsule Take 1 capsule (100 mg total) by mouth every 12 (twelve) hours. 60 capsule 0 12/23/2014 at Unknown time  . furosemide (LASIX) 40 MG tablet Take 40 mg by mouth daily as needed for fluid or edema.    12/23/2014 at Unknown time  . hydrALAZINE (APRESOLINE) 50 MG tablet Take 0.5 tablets (25 mg total) by mouth 3 (three) times daily. 135 tablet 3 12/23/2014 at 7p  . ipratropium-albuterol (DUONEB) 0.5-2.5 (3) MG/3ML SOLN Take 3 mLs by nebulization every 4 (four) hours as needed (for shortness of breath).   12/23/2014 at Unknown time  . lisinopril (PRINIVIL,ZESTRIL) 40 MG tablet Take 40 mg by mouth daily.   12/23/2014 at Unknown time  . magnesium citrate SOLN Take 296 mLs (1 Bottle total) by mouth once. 195 mL 0 12/23/2014 at Unknown time  . metoprolol succinate (TOPROL-XL) 25 MG 24 hr tablet Take 1 tablet (25 mg total) by mouth daily. (Patient taking differently: Take 12.5 mg by mouth daily. ) 45 tablet 3 12/23/2014 at 8a  . Multiple  Vitamin (MULITIVITAMIN WITH MINERALS) TABS Take 1 tablet by mouth daily.   12/23/2014 at Unknown time  . naproxen sodium (ALEVE) 220 MG tablet Take 440 mg by mouth 2 (two) times daily as needed (for pain).   12/23/2014 at Unknown time  . niacin (NIASPAN) 500 MG CR tablet Take 1,000 mg by mouth 2 (two) times daily.    12/23/2014 at Unknown time  . potassium chloride SA (K-DUR,KLOR-CON) 20 MEQ tablet TAKE 2 TABLETS THREE TIMES DAILY 540 tablet 0 12/23/2014 at Unknown time  . pravastatin (PRAVACHOL) 80 MG tablet Take 1 tablet (80 mg total) by mouth daily. 90 tablet 2 12/23/2014 at Unknown time  . warfarin (COUMADIN) 5 MG tablet Take as directed (Patient taking differently: Take 5-7.5 mg by mouth daily at 6 PM. Patient takes 5  mg on Tuesday and Saturday and other days patient takes 7.5 mg) 90 tablet 3 12/23/2014    Assessment: 74 yo presented with unsteadiness, weakness  and bp in 190's. INR 1.95 Head CT is unremarkable per neurology. Coumadin to continue. Goal of Therapy:  INR 2-3 Monitor platelets by anticoagulation protocol: Yes   Plan:  Cont home dose of coumadin 7.5 daily excepht tues and sat 5mg  Daily INR  Janice Coffin 12/24/2014,1:00 AM

## 2014-12-25 ENCOUNTER — Ambulatory Visit: Payer: Self-pay | Admitting: Cardiovascular Disease

## 2014-12-25 DIAGNOSIS — I6340 Cerebral infarction due to embolism of unspecified cerebral artery: Secondary | ICD-10-CM

## 2014-12-25 DIAGNOSIS — I4891 Unspecified atrial fibrillation: Secondary | ICD-10-CM

## 2014-12-25 DIAGNOSIS — Z7901 Long term (current) use of anticoagulants: Secondary | ICD-10-CM

## 2014-12-25 DIAGNOSIS — I517 Cardiomegaly: Secondary | ICD-10-CM

## 2014-12-25 LAB — CBC
HEMATOCRIT: 45.1 % (ref 39.0–52.0)
HEMOGLOBIN: 14.7 g/dL (ref 13.0–17.0)
MCH: 30 pg (ref 26.0–34.0)
MCHC: 32.6 g/dL (ref 30.0–36.0)
MCV: 92 fL (ref 78.0–100.0)
Platelets: 150 10*3/uL (ref 150–400)
RBC: 4.9 MIL/uL (ref 4.22–5.81)
RDW: 14.5 % (ref 11.5–15.5)
WBC: 6.7 10*3/uL (ref 4.0–10.5)

## 2014-12-25 LAB — BASIC METABOLIC PANEL
Anion gap: 8 (ref 5–15)
BUN: 13 mg/dL (ref 6–23)
CO2: 29 mmol/L (ref 19–32)
Calcium: 8.3 mg/dL — ABNORMAL LOW (ref 8.4–10.5)
Chloride: 107 mEq/L (ref 96–112)
Creatinine, Ser: 1.15 mg/dL (ref 0.50–1.35)
GFR calc Af Amer: 72 mL/min — ABNORMAL LOW (ref >90)
GFR calc non Af Amer: 62 mL/min — ABNORMAL LOW (ref >90)
Glucose, Bld: 95 mg/dL (ref 70–99)
Potassium: 3.4 mmol/L — ABNORMAL LOW (ref 3.5–5.1)
SODIUM: 144 mmol/L (ref 135–145)

## 2014-12-25 LAB — PROTIME-INR
INR: 1.51 — AB (ref 0.00–1.49)
PROTHROMBIN TIME: 18.3 s — AB (ref 11.6–15.2)

## 2014-12-25 MED ORDER — WARFARIN SODIUM 5 MG PO TABS
10.0000 mg | ORAL_TABLET | Freq: Once | ORAL | Status: AC
  Administered 2014-12-25: 10 mg via ORAL
  Filled 2014-12-25: qty 2

## 2014-12-25 MED ORDER — DABIGATRAN ETEXILATE MESYLATE 150 MG PO CAPS: 150.0000 mg | ORAL_CAPSULE | Freq: Two times a day (BID) | ORAL | Status: DC

## 2014-12-25 NOTE — Progress Notes (Signed)
SLP Cancellation Note  Patient Details Name: [name] [name] MRN: [id number] DOB: 10/22/1941   Cancelled treatment:        Attempted to see pt for speech/language eval.  Pt preparing for d/c and reports, "My speech is fine.  I talk like I always did."  Family member concurs.   Maryjo Rochester T 12/25/2014, 4:09 PM

## 2014-12-25 NOTE — Progress Notes (Signed)
STROKE TEAM PROGRESS NOTE   HISTORY [name] [name] is a 74 y.o. male with a history of hypertension, atrial fibrillation currently on warfarin therapy. He presents with weakness and unsteadiness. He states that he has had some "dizziness" for about a week, but that night the got much worse and he has now noticed right-sided discoordination as well. He presented to the emergency room tonight, given that he was having difficulty walking and noticed that his blood pressure was in the 190s. He was LKW One week ago. Patient was not administered TPA secondary to delay in arrival. He was admitted for further evaluation and treatment.   SUBJECTIVE (INTERVAL HISTORY) The patient's daughter is at the bedside. Anticoagulation discussed. The patient states he was on Pradaxa in the past but was unable to afford this medication. The patient's daughter felt that the patient should be changed to Pradaxa and the patient agreed that he would switch from Coumadin.   OBJECTIVE Temp:  [98.4 F (36.9 C)-98.6 F (37 C)] 98.6 F (37 C) (01/15 0533) Pulse Rate:  [63-70] 70 (01/15 0533) Cardiac Rhythm:  [-] A-V Sequential paced (01/15 0804) Resp:  [16-18] 16 (01/15 0533) BP: (135-180)/(67-99) 151/75 mmHg (01/15 0533) SpO2:  [92 %-99 %] 92 % (01/15 0533)  No results for input(s): GLUCAP in the last 168 hours.  Recent Labs Lab 12/23/14 1948 12/25/14 0648  NA 145 144  K 3.5 3.4*  CL 107 107  CO2 22 29  GLUCOSE 99 95  BUN 18 13  CREATININE 1.24 1.15  CALCIUM 9.1 8.3*    Recent Labs Lab 12/23/14 1948  AST 21  ALT 16  ALKPHOS 96  BILITOT 0.4  PROT 6.2  ALBUMIN 3.8    Recent Labs Lab 12/23/14 1948 12/25/14 0648  WBC 8.2 6.7  NEUTROABS 4.1  --   HGB 15.4 14.7  HCT 46.0 45.1  MCV 91.1 92.0  PLT 171 150   No results for input(s): CKTOTAL, CKMB, CKMBINDEX, TROPONINI in the last 168 hours.  Recent Labs  12/23/14 1948 12/24/14 0714 12/25/14 0648  LABPROT 22.4* 21.3* 18.3*  INR 1.95*  1.82* 1.51*   No results for input(s): COLORURINE, LABSPEC, PHURINE, GLUCOSEU, HGBUR, BILIRUBINUR, KETONESUR, PROTEINUR, UROBILINOGEN, NITRITE, LEUKOCYTESUR in the last 72 hours.  Invalid input(s): APPERANCEUR     Component Value Date/Time   CHOL 144 12/24/2014 0333   TRIG 154* 12/24/2014 0333   HDL 38* 12/24/2014 0333   CHOLHDL 3.8 12/24/2014 0333   VLDL 31 12/24/2014 0333   LDLCALC 75 12/24/2014 0333   Lab Results  Component Value Date   HGBA1C 5.5 12/24/2014      Component Value Date/Time   LABOPIA NONE DETECTED 01/14/2013 0417   COCAINSCRNUR NONE DETECTED 01/14/2013 0417   LABBENZ NONE DETECTED 01/14/2013 0417   AMPHETMU NONE DETECTED 01/14/2013 0417   THCU NONE DETECTED 01/14/2013 0417   LABBARB NONE DETECTED 01/14/2013 0417    No results for input(s): ETH in the last 168 hours.  Ct Angio Head W/cm &/or Wo Cm 12/25/2014  CT HEAD Brain: Diffuse prominence of the CSF containing spaces is consistentwith generalized cerebral atrophy.  Patchy and confluent hypodensity within the periventricular and deep white matter both cerebral hemispheres again seen, most compatible with chronic small vessel ischemic disease.  No definite new or evolving cortical or large vessel territory infarct.  Hypodensity within the mesial right temporal lobe slightly more prominent on axial sequence, likely to slice selection. No intracranial hemorrhage.  CTA NECK IMPRESSION:   1.  No hemodynamically significant stenosis or vascular occlusion identified within the neck.  2. Moderate calcified plaque at the right carotid bifurcation and proximal right ICA with associated mild stenosis of 20-30% by NASCET criteria.  3. Sequelae of prior left carotid endarterectomy. The left ICA is widely patent.   CTA BRAIN IMPRESSION:   1. No proximal branch occlusion identified within the intracranial circulation.  2. Moderate calcified atheromatous disease within the cavernous ICAs with associated stenosis of up to  50% by NASCET criteria.  3. Mild atherosclerotic plaque within the distal vertebral arteries bilaterally without significant stenosis.     Ct Head Wo Contrast 12/23/2014    No acute intracranial findings. Stable chronic small vessel ischemic changes with interval improved paranasal sinus inflammation.   2-D echocardiogram 12/25/2014    Study Conclusions - Left ventricle: The cavity size was severely dilated. Wall thickness was increased in a pattern of mild LVH. Systolic function was mildly to moderately reduced. The estimated ejection fraction was in the range of 40% to 45%. Diffuse hypokinesis. There is akinesis of the inferior myocardium. Doppler parameters are consistent with abnormal left ventricular relaxation (grade 1 diastolic dysfunction). - Left atrium: The atrium was moderately dilated. Impressions: - Inferior akinesis with mild to moderately reduced LV function; grade 1 diastolic dysfunction; if clinically indicated, TEE would have better sensitivity for source of embolus.    PHYSICAL EXAM Pleasant obese Caucasian male not in distress.Awake alert. Afebrile. Head is nontraumatic. Neck is supple without bruit. Hearing is normal. Cardiac exam no murmur or gallop. Lungs are clear to auscultation. Distal pulses are well felt. Neurological Exam : Awake alert oriented 3 with normal speech and language function. No aphasia or apraxia dysarthria. Fundi were not visualized. Vision acuity and fields appear adequate. Face is symmetric without weakness. Tongue is midline. Motor system exam revealed no upper or lower extremity drift but mild weakness of right grip and intrinsic hand muscles. Orbits left over right upper extremity. Subjective diminished touch and pinprick sensation on the right hemibody including forehead. Deep tendon reflexes are 2+ symmetric. Coordination is slow but accurate. Plantars are downgoing. Gait was not tested.    ASSESSMENT/PLAN [name] is a 74 y.o. male with history of CAD S/P AICD/Pacemaker, Ischemic Cardiomyopathy /Systolic CHF with an EF of 35%, Atrial Fibrillation on Coumadin Rx, HTN, CRI , and an AAA which is under surveillance who presents to the ED with complaints of dizziness and right sided weakness x 1 week. He did not receive IV t-PA  due to delay in arrival.   Probable posterior circulaton Stroke, workup underway  Resultant  Dizziness and right hemiparesis/sensory loss improving  MRI  / MRA  ICD  CT angio head and neck - unremarkable as above.  Carotid Doppler  Done Nov 2015 - R ICA < 40%, L IVA patent s/ hx CEA  2D Echo  see above  Warfarin for VTE prophylaxis  Diet Heart thin liquids  warfarin prior to admission, now on warfarin  Patient counseled to be compliant with his antithrombotic medications  Ongoing aggressive stroke risk factor management  Therapy recommendations:  pending   Disposition:  pending   Atrial Fibrillation  Home meds:  Coumadin, continued in the hospital  INR 1.95 on admission INR  1.51 Friday (today)  Hypertension  Permissive hypertension (OK if < 220/120) but gradually normalize in 5-7 days  Hyperlipidemia  Home meds:  pravachol 80 , resumed in hospital  LDL 75, goal < 70  Continue statin  at discharge  Diabetes  HgbA1c 5.5 goal < 7.0  Other Stroke Risk Factors  Advanced age  Former smoker, quit 10 years ago  Obesity, Body mass index is 32.73 kg/(m^2).   Hx stroke/TIA  Family hx stroke (uncle)  Coronary artery disease - CABG, MII  S/p L CEA by Dr. [name]  Other Active Problems  Iliac artery aneurysm followed by Dr. [name]  Ischemic cardiomyopathy  Chronic systolic heart failure  Renal insufficiency  Other Pertinent History  Back surgery with resultant loss on sensation R leg    Plan  Change warfarin to Pradaxa  Await therapist's evaluations  Stroke team will sign off. Please call if we can be of further  service.  Follow-up with Dr. Kathaleen Grinder day # 2  Shannan Harper, DAVID Wallace [facility] See Amion for Pager information 12/25/2014 9:39 AM  I have personally examined this patient, reviewed notes, independently viewed imaging studies, participated in medical decision making and plan of care. I have made any additions or clarifications directly to the above note. Agree with note above. I think has a small brainstem/posterior circulation infarct not visualized on the CT scan.  Dc home later today. Discussed with patient and daughter and answered questions. Delia Heady, MD    To contact Stroke Continuity provider, please refer to [email or web address]. After hours, contact General Neurology

## 2014-12-25 NOTE — Evaluation (Signed)
Physical Therapy Evaluation/Discharge.  Patient Details Name: [name] [name] MRN: [id number] DOB: 09-07-1941 Today's Date: 12/25/2014   History of Present Illness  Pt is a 74 y.o. Male admitted 12/23/14 RUE weakness and dizziness x1 week. PMH: CAD s/p AICD/Pacemaker, ischemic cardiomyopathy, A-fib on Coumadin, HTN, CRI, and AAA. CT was negative for acute abnormality.   Clinical Impression  The only deficit noted on eval is some mild difficulty with right hand coordination.  LEs are WNL for strength, sensation, and coordination. He had one small LOB not requiring any external assist when preforming a very fast 180 degree turn, otherwise he is functionally intact without signs of right leg buckling during gait and stairs. PT is very close to his baseline and will not require f/u PT at this time.  PT to sign off.     Follow Up Recommendations No PT follow up    Equipment Recommendations  None recommended by PT    Recommendations for Other Services   NA    Precautions / Restrictions Precautions Precautions: None Restrictions Weight Bearing Restrictions: No      Mobility  Bed Mobility Overal bed mobility: Independent                Transfers Overall transfer level: Independent Equipment used: None Transfers: Sit to/from Stand Sit to Stand: Supervision         General transfer comment: Supervision for safety. No c/o dizziness.   Ambulation/Gait Ambulation/Gait assistance: Supervision Ambulation Distance (Feet): 250 Feet Assistive device: None Gait Pattern/deviations: Step-through pattern;Staggering left;Staggering right   Gait velocity interpretation: at or above normal speed for age/gender General Gait Details: pt did well with level surface gait, good speed, no obvious LOB. He only had one stagger with turning 180 degrees during gait.   Stairs Stairs: Yes Stairs assistance: Modified independent (Device/Increase time) Stair Management: One rail  Right;Alternating pattern;Forwards Number of Stairs: 9 General stair comments: Pt using railing for stability, but no signs of instability in right leg with reciprocal pattern      Modified Rankin (Stroke Patients Only) Modified Rankin (Stroke Patients Only) Pre-Morbid Rankin Score: No symptoms Modified Rankin: Slight disability           Pertinent Vitals/Pain Pain Assessment: No/denies pain    Home Living Family/patient expects to be discharged to:: Private residence Living Arrangements: Alone Available Help at Discharge: Family;Available PRN/intermittently Type of Home: Mobile home Home Access: Stairs to enter Entrance Stairs-Rails: Can reach both Entrance Stairs-Number of Steps: 2 Home Layout: One level Home Equipment: Walker - 2 wheels;Grab bars - tub/shower      Prior Function Level of Independence: Independent               Hand Dominance   Dominant Hand: Right    Extremity/Trunk Assessment   Upper Extremity Assessment: Defer to OT evaluation           Lower Extremity Assessment: Overall WFL for tasks assessed (strength, coordination, and sensation all WNL)      Cervical / Trunk Assessment: Normal  Communication   Communication: No difficulties  Cognition Arousal/Alertness: Awake/alert Behavior During Therapy: WFL for tasks assessed/performed Overall Cognitive Status: Within Functional Limits for tasks assessed                      General Comments General comments (skin integrity, edema, etc.): Pt with increased DOE during gait.  He reports this is his baseline due to h/o COPD. Reviewed stroke signs and symptoms with pt as  well as importance of getting to ED quickly if he has any signs and symptoms of a stroke.           Assessment/Plan    PT Assessment Patent does not need any further PT services  PT Diagnosis Difficulty walking;Abnormality of gait;Hemiplegia dominant side         PT Goals (Current goals can be found in  the Care Plan section) Acute Rehab PT Goals Patient Stated Goal: to go home today PT Goal Formulation: All assessment and education complete, DC therapy               End of Session Equipment Utilized During Treatment: Gait belt Activity Tolerance: Patient tolerated treatment well Patient left: in bed;with family/visitor present;Other (comment) (seated EOB) Nurse Communication: Mobility status         Time: 4081-4481 PT Time Calculation (min) (ACUTE ONLY): 15 min   Charges:   PT Evaluation $Initial PT Evaluation Tier I: 1 Procedure          Maryclare Nydam B. Nawaal Alling, PT, DPT [id number]   12/25/2014, 3:49 PM

## 2014-12-25 NOTE — Progress Notes (Signed)
ANTICOAGULATION CONSULT NOTE - Follow-up  Pharmacy Consult for warfarin Indication: atrial fibrillation  No Known Allergies  Patient Measurements: Height: 6\' 1"  (185.4 cm) Weight: 248 lb (112.492 kg) IBW/kg (Calculated) : 79.9  Vital Signs: Temp: 98.6 F (37 C) (01/15 0533) Temp Source: Oral (01/15 0533) BP: 151/75 mmHg (01/15 0533) Pulse Rate: 70 (01/15 0533)  Labs:  Recent Labs  12/23/14 1948 12/24/14 0714 12/25/14 0648  HGB 15.4  --  14.7  HCT 46.0  --  45.1  PLT 171  --  150  LABPROT 22.4* 21.3* 18.3*  INR 1.95* 1.82* 1.51*  CREATININE 1.24  --   --     Estimated Creatinine Clearance: 70.8 mL/min (by C-G formula based on Cr of 1.24).  Medications:  Prescriptions prior to admission  Medication Sig Dispense Refill Last Dose  . albuterol (PROVENTIL HFA;VENTOLIN HFA) 108 (90 BASE) MCG/ACT inhaler Inhale 2 puffs into the lungs every 4 (four) hours as needed for wheezing. 1 Inhaler 0 Past Week at Unknown time  . amiodarone (PACERONE) 200 MG tablet TAKE 1 TABLET EVERY DAY (Patient taking differently: TAKE 1 TABLET BY MOUTH EVERY DAY) 90 tablet 2 12/22/2014 at Unknown time  . amLODipine (NORVASC) 10 MG tablet Take 1 tablet (10 mg total) by mouth daily. 90 tablet 4 12/23/2014 at Unknown time  . docusate sodium (COLACE) 100 MG capsule Take 1 capsule (100 mg total) by mouth every 12 (twelve) hours. 60 capsule 0 12/23/2014 at Unknown time  . furosemide (LASIX) 40 MG tablet Take 40 mg by mouth daily as needed for fluid or edema.    12/23/2014 at Unknown time  . hydrALAZINE (APRESOLINE) 50 MG tablet Take 0.5 tablets (25 mg total) by mouth 3 (three) times daily. 135 tablet 3 12/23/2014 at 7p  . ipratropium-albuterol (DUONEB) 0.5-2.5 (3) MG/3ML SOLN Take 3 mLs by nebulization every 4 (four) hours as needed (for shortness of breath).   12/23/2014 at Unknown time  . lisinopril (PRINIVIL,ZESTRIL) 40 MG tablet Take 40 mg by mouth daily.   12/23/2014 at Unknown time  . magnesium citrate SOLN  Take 296 mLs (1 Bottle total) by mouth once. 195 mL 0 12/23/2014 at Unknown time  . metoprolol succinate (TOPROL-XL) 25 MG 24 hr tablet Take 1 tablet (25 mg total) by mouth daily. (Patient taking differently: Take 12.5 mg by mouth daily. ) 45 tablet 3 12/23/2014 at 8a  . Multiple Vitamin (MULITIVITAMIN WITH MINERALS) TABS Take 1 tablet by mouth daily.   12/23/2014 at Unknown time  . naproxen sodium (ALEVE) 220 MG tablet Take 440 mg by mouth 2 (two) times daily as needed (for pain).   12/23/2014 at Unknown time  . niacin (NIASPAN) 500 MG CR tablet Take 1,000 mg by mouth 2 (two) times daily.    12/23/2014 at Unknown time  . potassium chloride SA (K-DUR,KLOR-CON) 20 MEQ tablet TAKE 2 TABLETS THREE TIMES DAILY 540 tablet 0 12/23/2014 at Unknown time  . pravastatin (PRAVACHOL) 80 MG tablet Take 1 tablet (80 mg total) by mouth daily. 90 tablet 2 12/23/2014 at Unknown time  . warfarin (COUMADIN) 5 MG tablet Take as directed (Patient taking differently: Take 5-7.5 mg by mouth daily at 6 PM. Patient takes 5 mg on Tuesday and Saturday and other days patient takes 7.5 mg) 90 tablet 3 12/23/2014   Assessment: 73 yom presented to the hospital with weakness and unsteadiness. He is on chronic coumadin for hx of afib. INR continues to decrease and it is now down to 1.51. Unclear  why INR is falling since he has not missed any doses. No bleeding noted.   Goal of Therapy:  INR 2-3   Plan:  1. Repeat Warfarin  PO x 1 today - will give at noon to better assess effect on AM INR 2. F/u AM INR 3. Consider lovenox while INR is low  Lysle Pearl, PharmD, BCPS Pager # [phone] 12/25/2014 8:10 AM

## 2014-12-25 NOTE — Progress Notes (Signed)
Patient is being d/c. D/c instruction given and patient verbalized understanding. Condition stable

## 2014-12-25 NOTE — Progress Notes (Signed)
Nutrition Brief Note  Patient identified due to Malnutrition Screening Tool (MST)  Wt Readings from Last 10 Encounters:  12/23/14 248 lb (112.492 kg)  12/15/14 249 lb 11.2 oz (113.263 kg)  11/02/14 250 lb (113.399 kg)  10/21/14 246 lb (111.585 kg)  10/06/14 253 lb 9.6 oz (115.032 kg)  06/04/14 254 lb (115.214 kg)  04/30/14 264 lb 1.6 oz (119.795 kg)  04/24/14 264 lb 6.4 oz (119.931 kg)  02/10/14 273 lb (123.832 kg)  01/14/14 273 lb (123.832 kg)   Pt presented with weakness and unsteadiness and dizziness for about a week. Entered emergency room 1/14 for difficulty walking and high blood pressure. Pt reports varied appetite, and recent weight loss without trying (10% weight loss from last February). Pt lives alone at home.  Pt daughter was in room upon assessment. Pt is currently on Heart Healthy diet and reported percentage meals consumed was 90% and is eating well. He reports that appetite overall is good, but sometimes skips meals. Discussed healthy snack options with pt. Performed physical exam and no fat or muscle depletion was present.  Labs and medications reviewed. No further interventions required at this time.   Cristela Felt, MS Dietetic Intern Pager: [phone]

## 2014-12-25 NOTE — Progress Notes (Signed)
  Echocardiogram 2D Echocardiogram has been performed.  Arvil Chaco 12/25/2014, 10:36 AM

## 2014-12-25 NOTE — Evaluation (Signed)
Occupational Therapy Evaluation Patient Details Name: [name] [name] MRN: [id number] DOB: June 24, 1941 Today's Date: 12/25/2014    History of Present Illness Pt is a 74 y.o. Male admitted 12/23/14 RUE weakness and dizziness x1 week. PMH: CAD s/p AICD/Pacemaker, ischemic cardiomyopathy, A-fib on Coumadin, HTN, CRI, and AAA. CT was negative for acute abnormality.    Clinical Impression   PTA pt lived at home and was independent with ADLs. Pt currently requires supervision due to RLE weakness and slight give during ambulation. Pt is eager to get home, however recommend that PT see prior to d/c for high level balance activities. No further acute OT needs.     Follow Up Recommendations  No OT follow up;Supervision/Assistance - 24 hour    Equipment Recommendations  None recommended by OT    Recommendations for Other Services       Precautions / Restrictions Precautions Precautions: Fall Restrictions Weight Bearing Restrictions: No      Mobility Bed Mobility Overal bed mobility: Modified Independent                Transfers Overall transfer level: Needs assistance Equipment used: None Transfers: Sit to/from Stand Sit to Stand: Supervision         General transfer comment: Supervision for safety. No c/o dizziness.          ADL Overall ADL's : Needs assistance/impaired                                       General ADL Comments: Pt overall supervision for ADLs due to RLE weakness and slight give when walking. Pt reports spinal injections x2 since September. Educated pt on signs and symptoms of stroke as well as fall prevention and energy conservation.      Vision  Pt reports no change from baseline. No apparent deficits.                    Perception Perception Perception Tested?: No   Praxis Praxis Praxis tested?: Within functional limits    Pertinent Vitals/Pain Pain Assessment: No/denies pain     Hand Dominance Right    Extremity/Trunk Assessment Upper Extremity Assessment Upper Extremity Assessment: Overall WFL for tasks assessed   Lower Extremity Assessment Lower Extremity Assessment: Overall WFL for tasks assessed   Cervical / Trunk Assessment Cervical / Trunk Assessment: Normal   Communication Communication Communication: No difficulties   Cognition Arousal/Alertness: Awake/alert Behavior During Therapy: WFL for tasks assessed/performed Overall Cognitive Status: Within Functional Limits for tasks assessed                                Home Living Family/patient expects to be discharged to:: Private residence Living Arrangements: Alone Available Help at Discharge: Family;Available PRN/intermittently Type of Home: Mobile home Home Access: Stairs to enter Entrance Stairs-Number of Steps: 2 Entrance Stairs-Rails: Can reach both Home Layout: One level     Bathroom Shower/Tub: Chief Strategy Officer: Standard     Home Equipment: Environmental consultant - 2 wheels;Grab bars - tub/shower          Prior Functioning/Environment Level of Independence: Independent             OT Diagnosis: Generalized weakness    End of Session Equipment Utilized During Treatment: Gait belt  Activity Tolerance: Patient tolerated treatment well Patient left: in  bed;with call bell/phone within reach;with bed alarm set;with family/visitor present   Time: 4098-1191 OT Time Calculation (min): 16 min Charges:  OT General Charges $OT Visit: 1 Procedure OT Evaluation $Initial OT Evaluation Tier I: 1 Procedure OT Treatments $Self Care/Home Management : 8-22 mins G-Codes:    Nena Jordan M 01-24-15, 3:01 PM  Carney Living, OTR/L Occupational Therapist [phone] (pager)

## 2014-12-25 NOTE — Discharge Summary (Addendum)
Physician Discharge Summary  [name] [name]:[id number] DOB: Oct 30, 1941 DOA: 12/23/2014  PCP: [name], MD  Admit date: 12/23/2014 Discharge date: 12/25/2014  Time spent: 45 minutes  Recommendations for Outpatient Follow-up:  1. Follow up with PCP in 1-2 weeks 2. Follow up with Cardiology as scheduled 3. Follow up with Dr. Pearlean Brownie in 1 month   Discharge Diagnoses:  Principal Problem:   CVA (cerebral infarction) Active Problems:   HYPERTENSION, BENIGN   Cardiomyopathy, ischemic   Atrial fibrillation   Long term current use of anticoagulant   Abdominal aortic aneurysm   Chronic systolic heart failure   Automatic implantable cardioverter-defibrillator in situ   Stroke   Renal insufficiency  Discharge Condition: stable  Diet recommendation: heart healthy  Filed Weights   12/23/14 1759  Weight: 112.492 kg (248 lb)   History of present illness:  [name] [name] is a 74 y.o. male With a history of CAD S/P AICD/Pacemaker, Ischemic Cardiomyopathy /Systolic CHF with an EF of 35%, Atrial Fibrillation on Coumadin Rx, HTN, CRI , and an AAA which is under surveillance who presents to the ED with complaints of dizziness and right sided weakness x 1 week. He denies any headache, chest pain, or syncope. He reports that he has been listing to this right . His daughter reports that he has had some difficulty with his speech. In the ED a CT scan of the head which was negative for acute findings. He was seen by Neurology Dr. Amada Jupiter and was referred for further evaluation.   Hospital Course:  Patient was admitted to the neurology floor with concern for CVA. We cannot obtain an MRI since patient has ICD in place, he underwent an CT with contrast which was negative for definite new or evolving cortical or large vessel territory infarct. Neurology was consulted and have followed patient while hospitalized. He also underwent a CTA of the neck which was negative for  hemodynamically significant stenosis or vascular occlusion. Part of the workup patient underwent a 2-D echo which showed an ejection fraction of 40-45%, improved since his last echo in 2014. He also has a history of atrial fibrillation, was on Coumadin for many years, at one point he was changed to Pradaxa which she took for a couple years however turned back to Coumadin few weeks ago due to financial reasons. His INR has been sub therapeutic on admission and while hospitalized, discussing with patient as well as neurology, we decided to resume patient's Pradaxa as it would offer a steadier anticoagulation profile. Patient also has a history of ischemic cardiomyopathy, systolic heart failure with ICD, A fib, has a history of COPD and renal insufficiency, these were stable during this hospitalization, patient without any chest pain or breathing difficulties, able to ambulate and work well with the physical therapy without any supplemental oxygen, chest pain or dyspnea, he was discharged home in stable condition, no other medication changes were made to his chronic regimen, he needs to follow up closely with his outpatient PCP, cardiologist as well as neurologist.  Procedures:  2D echo   Consultations:  Neurology  Discharge Exam: Filed Vitals:   12/24/14 2136 12/25/14 0533 12/25/14 1049 12/25/14 1409  BP: 142/67 151/75 145/70 129/57  Pulse: 70 70 57 66  Temp: 98.4 F (36.9 C) 98.6 F (37 C) 97.8 F (36.6 C) 97.5 F (36.4 C)  TempSrc: Oral Oral Oral Oral  Resp: Height:      Weight:      SpO2:  96% 92% 96% 96%    General: NAD Cardiovascular: RRR Respiratory: CTA biL  Discharge Instructions  Discharge Instructions    Ambulatory referral to Neurology    Complete by:  As directed   Dr. Pearlean Brownie requests follow up for this patient in 2 months.            Medication List    STOP taking these medications        warfarin 5 MG tablet  Commonly known as:  COUMADIN        TAKE these medications        albuterol 108 (90 BASE) MCG/ACT inhaler  Commonly known as:  PROVENTIL HFA;VENTOLIN HFA  Inhale 2 puffs into the lungs every 4 (four) hours as needed for wheezing.     ALEVE 220 MG tablet  Generic drug:  naproxen sodium  Take 440 mg by mouth 2 (two) times daily as needed (for pain).     amiodarone 200 MG tablet  Commonly known as:  PACERONE  TAKE 1 TABLET EVERY DAY     amLODipine 10 MG tablet  Commonly known as:  NORVASC  Take 1 tablet (10 mg total) by mouth daily.     dabigatran 150 MG Caps capsule  Commonly known as:  PRADAXA  Take 1 capsule (150 mg total) by mouth 2 (two) times daily.     docusate sodium 100 MG capsule  Commonly known as:  COLACE  Take 1 capsule (100 mg total) by mouth every 12 (twelve) hours.     furosemide 40 MG tablet  Commonly known as:  LASIX  Take 40 mg by mouth daily as needed for fluid or edema.     hydrALAZINE 50 MG tablet  Commonly known as:  APRESOLINE  Take 0.5 tablets (25 mg total) by mouth 3 (three) times daily.     ipratropium-albuterol 0.5-2.5 (3) MG/3ML Soln  Commonly known as:  DUONEB  Take 3 mLs by nebulization every 4 (four) hours as needed (for shortness of breath).     lisinopril 40 MG tablet  Commonly known as:  PRINIVIL,ZESTRIL  Take 40 mg by mouth daily.     magnesium citrate Soln  Take 296 mLs (1 Bottle total) by mouth once.     metoprolol succinate 25 MG 24 hr tablet  Commonly known as:  TOPROL-XL  Take 1 tablet (25 mg total) by mouth daily.     multivitamin with minerals Tabs tablet  Take 1 tablet by mouth daily.     niacin 500 MG CR tablet  Commonly known as:  NIASPAN  Take 1,000 mg by mouth 2 (two) times daily.     potassium chloride SA 20 MEQ tablet  Commonly known as:  K-DUR,KLOR-CON  TAKE 2 TABLETS THREE TIMES DAILY     pravastatin 80 MG tablet  Commonly known as:  PRAVACHOL  Take 1 tablet (80 mg total) by mouth daily.           Follow-up Information    Follow up  with GRISSO,GREG, MD. Schedule an appointment as soon as possible for a visit in 2 weeks.   Specialty:  Internal Medicine   Contact information:   [address] [place] [address] [phone]       Follow up with SETHI,PRAMOD, MD. Schedule an appointment as soon as possible for a visit in 1 month.   Specialties:  Neurology, Radiology   Contact information:   [address] [place] [address] [phone]  Follow up with Olga Millers, MD. Schedule an appointment as soon as possible for a visit in 2 weeks.   Specialty:  Cardiology   Contact information:   [address] [place] [address] [phone]       The results of significant diagnostics from this hospitalization (including imaging, microbiology, ancillary and laboratory) are listed below for reference.    Significant Diagnostic Studies: Ct Angio Head W/cm &/or Wo Cm  12/25/2014   CLINICAL DATA:  Initial valuation for weakness and unsteadiness with vertigo. Right-sided paresthesias.  EXAM: CT ANGIOGRAPHY HEAD AND NECK  TECHNIQUE: Multidetector CT imaging of the head and neck was performed using the standard protocol during bolus administration of intravenous contrast. Multiplanar CT image reconstructions and MIPs were obtained to evaluate the vascular anatomy. Carotid stenosis measurements (when applicable) are obtained utilizing NASCET criteria, using the distal internal carotid diameter as the denominator.  CONTRAST:  75mL OMNIPAQUE IOHEXOL 350 MG/ML SOLN  COMPARISON:  Prior noncontrast head CT  FINDINGS: CT HEAD  Brain: Diffuse prominence of the CSF containing spaces is consistent with generalized cerebral atrophy. Patchy and confluent hypodensity within the periventricular and deep white matter both cerebral hemispheres again seen, most compatible with chronic small vessel ischemic disease. No definite new or evolving cortical or large vessel territory infarct. Hypodensity within the  mesial right temporal lobe slightly more prominent on axial sequence, likely to slice selection. No intracranial hemorrhage.  Calvarium and skull base: No acute abnormality seen within calvarium and skullbase. Scalp soft tissues within normal limits.  Paranasal sinuses: Scattered paranasal sinus disease stable from prior.  Orbits: No acute abnormality seen about the orbits.  CTA NECK  Aortic arch: Aortic arch is of normal caliber in without aneurysm. Moderate atheromatous plaque present within the aortic arch and at the origin of the great vessels. No high-grade stenosis seen at the origin of the great vessels. Left-sided pacemaker partially visualized. Visualized subclavian arteries well opacified bilaterally.  Right carotid system: Calcified atheromatous disease present at the origin of the right common carotid artery without hemodynamically significant stenosis. The right common carotid artery is well opacified to the carotid bifurcation. There is calcified plaque about mild scattered plaque present within the distal right ICA is well without significant stenosis.  Left carotid system: Left common carotid artery tortuous but widely patent to the carotid bifurcation. Postoperative changes from prior left carotid endarterectomy present. The left ICA at the endarterectomy site is mildly tortuous but widely patent. Distally, the left ICA is well opacified to the level of the skullbase, although there is mild calcified plaque just prior to the ICA entering the petrous bone.  Vertebral arteries: Both vertebral arteries arise from the subclavian arteries. Proximal left vertebral artery is tortuous. Prominent calcifications present about the origin of the right vertebral artery. The vertebral arteries are well opacified to the vertebrobasilar junction without significant stenosis or occlusion.  Emphysematous changes noted within the visualized lungs. No soft tissue abnormality within the neck. No adenopathy. Thyroid  gland within normal limits.  Reversal of the normal cervical lordosis with moderate degenerative disc disease at C5-6, C6-7, C7-T1. Sternotomy wires partially visualized. No worrisome lytic or blastic osseous lesions.  CTA HEAD  Anterior circulation: Scattered calcified plaque present within the petrous segments bilaterally without significant stenosis. More heavy atherosclerotic calcifications present within the cavernous ICAs with associated stenosis of approximately 50% by NASCET criteria bilaterally. Supra clinoid segments within normal limits. A1 segments well opacified bilaterally. Anterior communicating artery normal. Anterior cerebral  arteries within normal limits.  No proximal M1 branch occlusion no significant stenosis within the proximal middle cerebral arteries. Distal branch atherosclerotic irregularity present within the MCA branches bilaterally.  Posterior circulation: Vertebral arteries are codominant. Moderate atheromatous plaque present within the mid vertebral arteries bilaterally without hemodynamically significant stenosis. Posterior inferior cerebral arteries well opacified. Vertebrobasilar junction and basilar artery within normal limits. Superior cerebellar arteries well opacified. Posterior cerebral arteries well opacified to their distal aspects without proximal branch occlusion or significant stenosis. Mild multi focal atherosclerotic irregularity present within the posterior cerebral arteries bilaterally. Prominent posterior communicating arteries present bilaterally.  Venous sinuses: No abnormality identified within the venous sinuses.  Anatomic variants: No aneurysm or vascular malformation.  Delayed phase: No abnormal enhancement on delayed sequence.  IMPRESSION: CTA NECK IMPRESSION:  1. No hemodynamically significant stenosis or vascular occlusion identified within the neck. 2. Moderate calcified plaque at the right carotid bifurcation and proximal right ICA with associated mild  stenosis of 20-30% by NASCET criteria. 3. Sequelae of prior left carotid endarterectomy. The left ICA is widely patent.  CTA BRAIN IMPRESSION:  1. No proximal branch occlusion identified within the intracranial circulation. 2. Moderate calcified atheromatous disease within the cavernous ICAs with associated stenosis of up to 50% by NASCET criteria. 3. Mild atherosclerotic plaque within the distal vertebral arteries bilaterally without significant stenosis.   Electronically Signed   By: Rise Mu M.D.   On: 12/25/2014 00:08   Dg Chest 2 View  12/16/2014   CLINICAL DATA:  Amiodarone surveillance, atrial fibrillation  EXAM: CHEST  2 VIEW  COMPARISON:  04/30/2014  FINDINGS: Cardiomediastinal silhouette is stable. Again noted status post CABG in enhanced sternotomy. Three leads cardiac pacemaker is unchanged in position. No acute infiltrate or pulmonary edema. Hyperinflation again noted. Bony thorax is unremarkable.  IMPRESSION: No active disease. Hyperinflation again noted. Stable 3 leads cardiac pacemaker position. Status post CABG.   Electronically Signed   By: Natasha Mead M.D.   On: 12/16/2014 16:24   Ct Head Wo Contrast  12/23/2014   CLINICAL DATA:  Dizziness with right-sided weakness. Initial encounter.  EXAM: CT HEAD WITHOUT CONTRAST  TECHNIQUE: Contiguous axial images were obtained from the base of the skull through the vertex without intravenous contrast.  COMPARISON:  Head CT 01/14/2013 and 01/13/2013.  FINDINGS: There is no evidence of acute intracranial hemorrhage, mass lesion, brain edema or extra-axial fluid collection. The ventricles and subarachnoid spaces are appropriately sized for age. There is no CT evidence of acute cortical infarction. There are stable chronic small vessel ischemic changes within the periventricular white matter. Intracranial vascular calcifications are noted.  There is interval improved aeration of the paranasal sinuses with some residual mucosal thickening, greatest  in the left frontal sinus. There are no air-fluid levels. The mastoids and middle ears are clear. The calvarium is intact.  IMPRESSION: No acute intracranial findings. Stable chronic small vessel ischemic changes with interval improved paranasal sinus inflammation.   Electronically Signed   By: Roxy Horseman M.D.   On: 12/23/2014 21:54   Ct Angio Neck W/cm &/or Wo/cm  12/25/2014   CLINICAL DATA:  Initial valuation for weakness and unsteadiness with vertigo. Right-sided paresthesias.  EXAM: CT ANGIOGRAPHY HEAD AND NECK  TECHNIQUE: Multidetector CT imaging of the head and neck was performed using the standard protocol during bolus administration of intravenous contrast. Multiplanar CT image reconstructions and MIPs were obtained to evaluate the vascular anatomy. Carotid stenosis measurements (when applicable) are obtained utilizing NASCET criteria, using the  distal internal carotid diameter as the denominator.  CONTRAST:  50mL OMNIPAQUE IOHEXOL 350 MG/ML SOLN  COMPARISON:  Prior noncontrast head CT  FINDINGS: CT HEAD  Brain: Diffuse prominence of the CSF containing spaces is consistent with generalized cerebral atrophy. Patchy and confluent hypodensity within the periventricular and deep white matter both cerebral hemispheres again seen, most compatible with chronic small vessel ischemic disease. No definite new or evolving cortical or large vessel territory infarct. Hypodensity within the mesial right temporal lobe slightly more prominent on axial sequence, likely to slice selection. No intracranial hemorrhage.  Calvarium and skull base: No acute abnormality seen within calvarium and skullbase. Scalp soft tissues within normal limits.  Paranasal sinuses: Scattered paranasal sinus disease stable from prior.  Orbits: No acute abnormality seen about the orbits.  CTA NECK  Aortic arch: Aortic arch is of normal caliber in without aneurysm. Moderate atheromatous plaque present within the aortic arch and at the origin of  the great vessels. No high-grade stenosis seen at the origin of the great vessels. Left-sided pacemaker partially visualized. Visualized subclavian arteries well opacified bilaterally.  Right carotid system: Calcified atheromatous disease present at the origin of the right common carotid artery without hemodynamically significant stenosis. The right common carotid artery is well opacified to the carotid bifurcation. There is calcified plaque about mild scattered plaque present within the distal right ICA is well without significant stenosis.  Left carotid system: Left common carotid artery tortuous but widely patent to the carotid bifurcation. Postoperative changes from prior left carotid endarterectomy present. The left ICA at the endarterectomy site is mildly tortuous but widely patent. Distally, the left ICA is well opacified to the level of the skullbase, although there is mild calcified plaque just prior to the ICA entering the petrous bone.  Vertebral arteries: Both vertebral arteries arise from the subclavian arteries. Proximal left vertebral artery is tortuous. Prominent calcifications present about the origin of the right vertebral artery. The vertebral arteries are well opacified to the vertebrobasilar junction without significant stenosis or occlusion.  Emphysematous changes noted within the visualized lungs. No soft tissue abnormality within the neck. No adenopathy. Thyroid gland within normal limits.  Reversal of the normal cervical lordosis with moderate degenerative disc disease at C5-6, C6-7, C7-T1. Sternotomy wires partially visualized. No worrisome lytic or blastic osseous lesions.  CTA HEAD  Anterior circulation: Scattered calcified plaque present within the petrous segments bilaterally without significant stenosis. More heavy atherosclerotic calcifications present within the cavernous ICAs with associated stenosis of approximately 50% by NASCET criteria bilaterally. Supra clinoid segments within  normal limits. A1 segments well opacified bilaterally. Anterior communicating artery normal. Anterior cerebral arteries within normal limits.  No proximal M1 branch occlusion no significant stenosis within the proximal middle cerebral arteries. Distal branch atherosclerotic irregularity present within the MCA branches bilaterally.  Posterior circulation: Vertebral arteries are codominant. Moderate atheromatous plaque present within the mid vertebral arteries bilaterally without hemodynamically significant stenosis. Posterior inferior cerebral arteries well opacified. Vertebrobasilar junction and basilar artery within normal limits. Superior cerebellar arteries well opacified. Posterior cerebral arteries well opacified to their distal aspects without proximal branch occlusion or significant stenosis. Mild multi focal atherosclerotic irregularity present within the posterior cerebral arteries bilaterally. Prominent posterior communicating arteries present bilaterally.  Venous sinuses: No abnormality identified within the venous sinuses.  Anatomic variants: No aneurysm or vascular malformation.  Delayed phase: No abnormal enhancement on delayed sequence.  IMPRESSION: CTA NECK IMPRESSION:  1. No hemodynamically significant stenosis or vascular occlusion identified within the neck.  2. Moderate calcified plaque at the right carotid bifurcation and proximal right ICA with associated mild stenosis of 20-30% by NASCET criteria. 3. Sequelae of prior left carotid endarterectomy. The left ICA is widely patent.  CTA BRAIN IMPRESSION:  1. No proximal branch occlusion identified within the intracranial circulation. 2. Moderate calcified atheromatous disease within the cavernous ICAs with associated stenosis of up to 50% by NASCET criteria. 3. Mild atherosclerotic plaque within the distal vertebral arteries bilaterally without significant stenosis.   Electronically Signed   By: Rise Mu M.D.   On: 12/25/2014 00:08     Labs: Basic Metabolic Panel:  Recent Labs Lab 12/23/14 1948 12/25/14 0648  NA 145 144  K 3.5 3.4*  CL 107 107  CO2 22 29  GLUCOSE 99 95  BUN 18 13  CREATININE 1.24 1.15  CALCIUM 9.1 8.3*   Liver Function Tests:  Recent Labs Lab 12/23/14 1948  AST 21  ALT 16  ALKPHOS 96  BILITOT 0.4  PROT 6.2  ALBUMIN 3.8   CBC:  Recent Labs Lab 12/23/14 1948 12/25/14 0648  WBC 8.2 6.7  NEUTROABS 4.1  --   HGB 15.4 14.7  HCT 46.0 45.1  MCV 91.1 92.0  PLT 171 150   Signed:  GHERGHE, COSTIN  [facility] 12/25/2014, 7:03 PM

## 2014-12-29 ENCOUNTER — Other Ambulatory Visit: Payer: Self-pay | Admitting: Cardiology

## 2014-12-29 NOTE — Telephone Encounter (Signed)
Rx refill sent to patient pharmacy   

## 2015-01-09 NOTE — Progress Notes (Signed)
HPI: FU CAD. He has a history of coronary artery disease, status post coronary artery bypassing graft, history of ischemic cardiomyopathy, PAF as well as COPD. His last catheterization in August of 2013 showed normal left main, occluded LAD, a small OM 2 with an ostial 95% lesion, 2 moderate size obtuse marginals were occluded. The right coronary was occluded. The LIMA to the LAD was patent. The saphenous vein graft to the diagonal had a 50% proximal stenosis. The saphenous vein graft to the obtuse marginal was patent. Saphenous vein graft to the right coronary artery had a mid 40% lesion. Ejection fraction was 35%. Patient subsequently had a biventricular ICD implanted in September of 2013. He also has a history of focal aortic dissection being managed medically. Note, he also has a left iliac artery aneurysm and mild cerebrovascular disease with history of carotid endarterectomy. He is followed by vascular surgery (Dr. [name]) for these issues. Last echocardiogram in Jan 2016 showed EF 40-45, mild LVH, grade 1 diastolic dysfunction and moderate LAE. Had recent CVA; coumadin changed to pradaxa. Since last seen, he has dyspnea on exertion which is unchanged. No orthopnea, PND, pedal edema or syncope. He has had an occasional chest pain for 1-2 seconds but no exertional symptoms.  Current Outpatient Prescriptions  Medication Sig Dispense Refill  . albuterol (PROVENTIL HFA;VENTOLIN HFA) 108 (90 BASE) MCG/ACT inhaler Inhale 2 puffs into the lungs every 4 (four) hours as needed for wheezing. 1 Inhaler 0  . amiodarone (PACERONE) 200 MG tablet TAKE 1 TABLET EVERY DAY (Patient taking differently: TAKE 1 TABLET BY MOUTH EVERY DAY) 90 tablet 2  . amLODipine (NORVASC) 10 MG tablet Take 1 tablet (10 mg total) by mouth daily. 90 tablet 4  . dabigatran (PRADAXA) 150 MG CAPS capsule Take 1 capsule (150 mg total) by mouth 2 (two) times daily. 180 capsule 1  . docusate sodium (COLACE) 100 MG capsule Take 1 capsule  (100 mg total) by mouth every 12 (twelve) hours. 60 capsule 0  . furosemide (LASIX) 40 MG tablet Take 40 mg by mouth daily as needed for fluid or edema.     . hydrALAZINE (APRESOLINE) 50 MG tablet Take 0.5 tablets (25 mg total) by mouth 3 (three) times daily. 135 tablet 3  . ipratropium-albuterol (DUONEB) 0.5-2.5 (3) MG/3ML SOLN Take 3 mLs by nebulization every 4 (four) hours as needed (for shortness of breath).    [name] lisinopril (PRINIVIL,ZESTRIL) 40 MG tablet TAKE 1 TABLET EVERY DAY 90 tablet 3  . magnesium citrate SOLN Take 296 mLs (1 Bottle total) by mouth once. 195 mL 0  . metoprolol succinate (TOPROL-XL) 25 MG 24 hr tablet Take 1 tablet (25 mg total) by mouth daily. (Patient taking differently: Take 12.5 mg by mouth daily. ) 45 tablet 3  . Multiple Vitamin (MULITIVITAMIN WITH MINERALS) TABS Take 1 tablet by mouth daily.    . naproxen sodium (ALEVE) 220 MG tablet Take 440 mg by mouth 2 (two) times daily as needed (for pain).    . niacin (NIASPAN) 500 MG CR tablet Take 1,000 mg by mouth 2 (two) times daily.     . potassium chloride SA (K-DUR,KLOR-CON) 20 MEQ tablet TAKE 2 TABLETS THREE TIMES DAILY 540 tablet 0  . pravastatin (PRAVACHOL) 80 MG tablet Take 1 tablet (80 mg total) by mouth daily. 90 tablet 2   No current facility-administered medications for this visit.     Past Medical History  Diagnosis Date  . Atrial fibrillation  s/p prior DCCV;  Amiodarone Rx.  . CAD (coronary artery disease)     s/p CABG;  cath 10/07:  LM ok, LAD occluded, AV CFX 95%, pRCA occluded; L-LAD, S-OM2/OM3, S-PDA ok  . Cerebrovascular disease     s/p prior Left CEA;  followed by Dr. [name]  . COPD (chronic obstructive pulmonary disease)   . Hyperlipidemia   . Hypertension   . Mitral insufficiency     hx of. not noted on echo 01/2012  . Nephrolithiasis   . Ischemic cardiomyopathy     echo 5/09: EF 55%, mild LAE;  Myoview 11/10: inf and apical scar with mild peri-infarct ischemia, EF 44%. Echo 01/26/12  with EF 20-25% but previously known to be 30% on echoes before last one  . Iliac artery aneurysm, left     followed by Dr. [name]  . Aortic dissection     H/O focal aortic dissection  . LBBB (left bundle branch block)   . CHF (congestive heart failure)   . Myocardial infarction   . Renal insufficiency     01/2012  . Stroke     Past Surgical History  Procedure Laterality Date  . Uteroscopy    . Coronary artery bypass graft  1998  . Left cea  07/05  . Appendectomy  09/11/11  . Carotid endarterectomy  2005    left  . Biventricular defibrillator implantation  08/22/12    SJM Quadra Assura BiV ICD implanted by Dr [name]  . Spine surgery  Sep 27, 2014    Lower Back L 4  and   L 5  . Bi-ventricular implantable cardioverter defibrillator N/A 08/22/2012    Procedure: BI-VENTRICULAR IMPLANTABLE CARDIOVERTER DEFIBRILLATOR  (CRT-D);  Surgeon: [name], MD;  Location: [facility] CATH LAB;  Service: Cardiovascular;  Laterality: N/A;    History   Social History  . Marital Status: Married    Spouse Name: N/A    Number of Children: 3  . Years of Education: N/A   Occupational History  . RETIRED---truck driver    Social History Main Topics  . Smoking status: Former Smoker -- 1.50 packs/day for 57 years    Types: Cigarettes    Quit date: 12/11/2004  . Smokeless tobacco: Never Used  . Alcohol Use: No     Comment: 6 yrs ago   . Drug Use: No  . Sexual Activity: No   Other Topics Concern  . Not on file   Social History Narrative   Lives in [place], [place] with wife.    Caffeine Use: Does drink coffee    ROS: no fevers or chills, productive cough, hemoptysis, dysphasia, odynophagia, melena, hematochezia, dysuria, hematuria, rash, seizure activity, orthopnea, PND, pedal edema, claudication. Remaining systems are negative.  Physical Exam: Well-developed well-nourished in no acute distress.  Skin is warm and dry.  HEENT is normal.  Neck is supple.  Chest is clear to auscultation with  normal expansion.  Cardiovascular exam is regular rate and rhythm.  Abdominal exam nontender or distended. No masses palpated. Extremities show no edema. neuro grossly intact  ECG     This encounter was created in error - please disregard.

## 2015-01-10 ENCOUNTER — Encounter (HOSPITAL_COMMUNITY): Payer: Self-pay | Admitting: Family Medicine

## 2015-01-10 ENCOUNTER — Observation Stay (HOSPITAL_COMMUNITY)
Admission: EM | Admit: 2015-01-10 | Discharge: 2015-01-12 | Disposition: A | Discharge status: Home / Self Care | Payer: Medicare Other | Attending: Internal Medicine | Admitting: Internal Medicine

## 2015-01-10 ENCOUNTER — Emergency Department (HOSPITAL_COMMUNITY): Payer: Medicare Other

## 2015-01-10 DIAGNOSIS — I252 Old myocardial infarction: Secondary | ICD-10-CM | POA: Insufficient documentation

## 2015-01-10 DIAGNOSIS — R4701 Aphasia: Secondary | ICD-10-CM | POA: Insufficient documentation

## 2015-01-10 DIAGNOSIS — I4891 Unspecified atrial fibrillation: Secondary | ICD-10-CM | POA: Insufficient documentation

## 2015-01-10 DIAGNOSIS — Z8673 Personal history of transient ischemic attack (TIA), and cerebral infarction without residual deficits: Secondary | ICD-10-CM | POA: Insufficient documentation

## 2015-01-10 DIAGNOSIS — E669 Obesity, unspecified: Secondary | ICD-10-CM | POA: Insufficient documentation

## 2015-01-10 DIAGNOSIS — I723 Aneurysm of iliac artery: Secondary | ICD-10-CM | POA: Insufficient documentation

## 2015-01-10 DIAGNOSIS — I5022 Chronic systolic (congestive) heart failure: Secondary | ICD-10-CM | POA: Insufficient documentation

## 2015-01-10 DIAGNOSIS — E780 Pure hypercholesterolemia: Secondary | ICD-10-CM | POA: Diagnosis not present

## 2015-01-10 DIAGNOSIS — I639 Cerebral infarction, unspecified: Secondary | ICD-10-CM | POA: Diagnosis not present

## 2015-01-10 DIAGNOSIS — R29810 Facial weakness: Secondary | ICD-10-CM | POA: Insufficient documentation

## 2015-01-10 DIAGNOSIS — N289 Disorder of kidney and ureter, unspecified: Secondary | ICD-10-CM | POA: Diagnosis not present

## 2015-01-10 DIAGNOSIS — I714 Abdominal aortic aneurysm, without rupture: Secondary | ICD-10-CM | POA: Insufficient documentation

## 2015-01-10 DIAGNOSIS — R42 Dizziness and giddiness: Secondary | ICD-10-CM | POA: Insufficient documentation

## 2015-01-10 DIAGNOSIS — E785 Hyperlipidemia, unspecified: Secondary | ICD-10-CM | POA: Diagnosis not present

## 2015-01-10 DIAGNOSIS — Z95810 Presence of automatic (implantable) cardiac defibrillator: Secondary | ICD-10-CM | POA: Diagnosis not present

## 2015-01-10 DIAGNOSIS — Z6832 Body mass index (BMI) 32.0-32.9, adult: Secondary | ICD-10-CM | POA: Insufficient documentation

## 2015-01-10 DIAGNOSIS — H8110 Benign paroxysmal vertigo, unspecified ear: Secondary | ICD-10-CM | POA: Diagnosis not present

## 2015-01-10 DIAGNOSIS — I10 Essential (primary) hypertension: Secondary | ICD-10-CM | POA: Insufficient documentation

## 2015-01-10 DIAGNOSIS — R0602 Shortness of breath: Secondary | ICD-10-CM

## 2015-01-10 DIAGNOSIS — Z87891 Personal history of nicotine dependence: Secondary | ICD-10-CM | POA: Insufficient documentation

## 2015-01-10 DIAGNOSIS — F329 Major depressive disorder, single episode, unspecified: Secondary | ICD-10-CM | POA: Diagnosis not present

## 2015-01-10 DIAGNOSIS — Z951 Presence of aortocoronary bypass graft: Secondary | ICD-10-CM | POA: Diagnosis not present

## 2015-01-10 DIAGNOSIS — J449 Chronic obstructive pulmonary disease, unspecified: Secondary | ICD-10-CM | POA: Diagnosis not present

## 2015-01-10 DIAGNOSIS — R202 Paresthesia of skin: Secondary | ICD-10-CM | POA: Insufficient documentation

## 2015-01-10 DIAGNOSIS — I2510 Atherosclerotic heart disease of native coronary artery without angina pectoris: Secondary | ICD-10-CM | POA: Insufficient documentation

## 2015-01-10 DIAGNOSIS — F32A Depression, unspecified: Secondary | ICD-10-CM | POA: Insufficient documentation

## 2015-01-10 DIAGNOSIS — I69351 Hemiplegia and hemiparesis following cerebral infarction affecting right dominant side: Secondary | ICD-10-CM | POA: Insufficient documentation

## 2015-01-10 DIAGNOSIS — Z7901 Long term (current) use of anticoagulants: Secondary | ICD-10-CM | POA: Diagnosis not present

## 2015-01-10 DIAGNOSIS — I255 Ischemic cardiomyopathy: Secondary | ICD-10-CM | POA: Diagnosis not present

## 2015-01-10 LAB — URINE MICROSCOPIC-ADD ON

## 2015-01-10 LAB — URINALYSIS, ROUTINE W REFLEX MICROSCOPIC
BILIRUBIN URINE: NEGATIVE
GLUCOSE, UA: NEGATIVE mg/dL
HGB URINE DIPSTICK: NEGATIVE
KETONES UR: NEGATIVE mg/dL
Leukocytes, UA: NEGATIVE
NITRITE: NEGATIVE
Protein, ur: 100 mg/dL — AB
Specific Gravity, Urine: 1.019 (ref 1.005–1.030)
Urobilinogen, UA: 1 mg/dL (ref 0.0–1.0)
pH: 7 (ref 5.0–8.0)

## 2015-01-10 LAB — DIFFERENTIAL
Basophils Absolute: 0 10*3/uL (ref 0.0–0.1)
Basophils Relative: 1 % (ref 0–1)
Eosinophils Absolute: 0.3 10*3/uL (ref 0.0–0.7)
Eosinophils Relative: 5 % (ref 0–5)
LYMPHS ABS: 1.5 10*3/uL (ref 0.7–4.0)
LYMPHS PCT: 25 % (ref 12–46)
Monocytes Absolute: 0.5 10*3/uL (ref 0.1–1.0)
Monocytes Relative: 8 % (ref 3–12)
Neutro Abs: 3.7 10*3/uL (ref 1.7–7.7)
Neutrophils Relative %: 61 % (ref 43–77)

## 2015-01-10 LAB — I-STAT CHEM 8, ED
BUN: 12 mg/dL (ref 6–23)
CALCIUM ION: 1.15 mmol/L (ref 1.13–1.30)
CHLORIDE: 107 mmol/L (ref 96–112)
Creatinine, Ser: 1 mg/dL (ref 0.50–1.35)
Glucose, Bld: 97 mg/dL (ref 70–99)
HEMATOCRIT: 48 % (ref 39.0–52.0)
Hemoglobin: 16.3 g/dL (ref 13.0–17.0)
Potassium: 3.7 mmol/L (ref 3.5–5.1)
SODIUM: 146 mmol/L — AB (ref 135–145)
TCO2: 25 mmol/L (ref 0–100)

## 2015-01-10 LAB — CBC
HCT: 45.6 % (ref 39.0–52.0)
HEMOGLOBIN: 14.8 g/dL (ref 13.0–17.0)
MCH: 30.1 pg (ref 26.0–34.0)
MCHC: 32.5 g/dL (ref 30.0–36.0)
MCV: 92.7 fL (ref 78.0–100.0)
Platelets: 149 10*3/uL — ABNORMAL LOW (ref 150–400)
RBC: 4.92 MIL/uL (ref 4.22–5.81)
RDW: 14.7 % (ref 11.5–15.5)
WBC: 6 10*3/uL (ref 4.0–10.5)

## 2015-01-10 LAB — PROTIME-INR
INR: 1.32 (ref 0.00–1.49)
Prothrombin Time: 16.5 seconds — ABNORMAL HIGH (ref 11.6–15.2)

## 2015-01-10 LAB — COMPREHENSIVE METABOLIC PANEL
ALT: 13 U/L (ref 0–53)
ANION GAP: 9 (ref 5–15)
AST: 18 U/L (ref 0–37)
Albumin: 3.5 g/dL (ref 3.5–5.2)
Alkaline Phosphatase: 86 U/L (ref 39–117)
BUN: 12 mg/dL (ref 6–23)
CHLORIDE: 111 mmol/L (ref 96–112)
CO2: 25 mmol/L (ref 19–32)
CREATININE: 1.03 mg/dL (ref 0.50–1.35)
Calcium: 8.7 mg/dL (ref 8.4–10.5)
GFR, EST AFRICAN AMERICAN: 82 mL/min — AB (ref >90)
GFR, EST NON AFRICAN AMERICAN: 71 mL/min — AB (ref >90)
Glucose, Bld: 104 mg/dL — ABNORMAL HIGH (ref 70–99)
Potassium: 3.8 mmol/L (ref 3.5–5.1)
SODIUM: 145 mmol/L (ref 135–145)
TOTAL PROTEIN: 6 g/dL (ref 6.0–8.3)
Total Bilirubin: 0.9 mg/dL (ref 0.3–1.2)

## 2015-01-10 LAB — CBG MONITORING, ED: Glucose-Capillary: 94 mg/dL (ref 70–99)

## 2015-01-10 LAB — I-STAT TROPONIN, ED: TROPONIN I, POC: 0.01 ng/mL (ref 0.00–0.08)

## 2015-01-10 LAB — APTT: aPTT: 49 seconds — ABNORMAL HIGH (ref 24–37)

## 2015-01-10 MED ORDER — SODIUM CHLORIDE 0.9 % IJ SOLN: 3.0000 mL | INTRAMUSCULAR | Status: DC | PRN

## 2015-01-10 MED ORDER — ALBUTEROL SULFATE (2.5 MG/3ML) 0.083% IN NEBU: 5.0000 mg | INHALATION_SOLUTION | RESPIRATORY_TRACT | Status: DC | PRN

## 2015-01-10 MED ORDER — STROKE: EARLY STAGES OF RECOVERY BOOK: Freq: Once | Status: DC

## 2015-01-10 MED ORDER — IPRATROPIUM-ALBUTEROL 0.5-2.5 (3) MG/3ML IN SOLN
3.0000 mL | RESPIRATORY_TRACT | Status: DC | PRN
  Administered 2015-01-10 – 2015-01-11 (×2): 3 mL via RESPIRATORY_TRACT
  Filled 2015-01-10 (×2): qty 3

## 2015-01-10 MED ORDER — LISINOPRIL 20 MG PO TABS
40.0000 mg | ORAL_TABLET | Freq: Every day | ORAL | Status: DC
Start: 2015-01-11 — End: 2015-01-12
  Administered 2015-01-11 – 2015-01-12 (×2): 40 mg via ORAL
  Filled 2015-01-10 (×2): qty 2

## 2015-01-10 MED ORDER — METOPROLOL SUCCINATE ER 25 MG PO TB24
12.5000 mg | ORAL_TABLET | Freq: Every day | ORAL | Status: DC
  Administered 2015-01-11 – 2015-01-12 (×2): 12.5 mg via ORAL
  Filled 2015-01-10 (×2): qty 1

## 2015-01-10 MED ORDER — ACETAMINOPHEN 325 MG PO TABS: 650.0000 mg | ORAL_TABLET | ORAL | Status: DC | PRN

## 2015-01-10 MED ORDER — SODIUM CHLORIDE 0.9 % IV SOLN: 250.0000 mL | INTRAVENOUS | Status: DC | PRN

## 2015-01-10 MED ORDER — AMIODARONE HCL 200 MG PO TABS
200.0000 mg | ORAL_TABLET | Freq: Every day | ORAL | Status: DC
  Administered 2015-01-11 – 2015-01-12 (×2): 200 mg via ORAL
  Filled 2015-01-10 (×2): qty 1

## 2015-01-10 MED ORDER — IPRATROPIUM BROMIDE 0.02 % IN SOLN
0.5000 mg | Freq: Once | RESPIRATORY_TRACT | Status: AC
  Administered 2015-01-10: 0.5 mg via RESPIRATORY_TRACT
  Filled 2015-01-10: qty 2.5

## 2015-01-10 MED ORDER — POTASSIUM CHLORIDE CRYS ER 20 MEQ PO TBCR
20.0000 meq | EXTENDED_RELEASE_TABLET | Freq: Every day | ORAL | Status: DC
  Administered 2015-01-11 – 2015-01-12 (×2): 20 meq via ORAL
  Filled 2015-01-10 (×2): qty 1

## 2015-01-10 MED ORDER — SODIUM CHLORIDE 0.9 % IJ SOLN
3.0000 mL | Freq: Two times a day (BID) | INTRAMUSCULAR | Status: DC
  Administered 2015-01-10 – 2015-01-12 (×4): 3 mL via INTRAVENOUS

## 2015-01-10 MED ORDER — FUROSEMIDE 40 MG PO TABS: 40.0000 mg | ORAL_TABLET | Freq: Every day | ORAL | Status: DC | PRN

## 2015-01-10 MED ORDER — HYDRALAZINE HCL 25 MG PO TABS
25.0000 mg | ORAL_TABLET | Freq: Three times a day (TID) | ORAL | Status: DC
  Administered 2015-01-11 – 2015-01-12 (×4): 25 mg via ORAL
  Filled 2015-01-10 (×4): qty 1

## 2015-01-10 MED ORDER — PRAVASTATIN SODIUM 40 MG PO TABS
80.0000 mg | ORAL_TABLET | Freq: Every day | ORAL | Status: DC
  Administered 2015-01-11 – 2015-01-12 (×2): 80 mg via ORAL
  Filled 2015-01-10 (×2): qty 2

## 2015-01-10 MED ORDER — NIACIN ER (ANTIHYPERLIPIDEMIC) 500 MG PO TBCR
1000.0000 mg | EXTENDED_RELEASE_TABLET | Freq: Every day | ORAL | Status: DC
  Administered 2015-01-10 – 2015-01-11 (×2): 1000 mg via ORAL
  Filled 2015-01-10 (×3): qty 2

## 2015-01-10 MED ORDER — ACETAMINOPHEN 650 MG RE SUPP: 650.0000 mg | RECTAL | Status: DC | PRN

## 2015-01-10 MED ORDER — AMLODIPINE BESYLATE 10 MG PO TABS
10.0000 mg | ORAL_TABLET | Freq: Every day | ORAL | Status: DC
Start: 2015-01-11 — End: 2015-01-12
  Administered 2015-01-11 – 2015-01-12 (×2): 10 mg via ORAL
  Filled 2015-01-10 (×2): qty 1

## 2015-01-10 MED ORDER — ALBUTEROL SULFATE (2.5 MG/3ML) 0.083% IN NEBU
5.0000 mg | INHALATION_SOLUTION | Freq: Once | RESPIRATORY_TRACT | Status: AC
  Administered 2015-01-10: 5 mg via RESPIRATORY_TRACT
  Filled 2015-01-10: qty 6

## 2015-01-10 MED ORDER — ONDANSETRON HCL 4 MG/2ML IJ SOLN
4.0000 mg | Freq: Four times a day (QID) | INTRAMUSCULAR | Status: DC | PRN
  Administered 2015-01-10: 4 mg via INTRAVENOUS
  Filled 2015-01-10: qty 2

## 2015-01-10 MED ORDER — ADULT MULTIVITAMIN W/MINERALS CH
1.0000 | ORAL_TABLET | Freq: Every day | ORAL | Status: DC
  Administered 2015-01-11 – 2015-01-12 (×2): 1 via ORAL
  Filled 2015-01-10 (×2): qty 1

## 2015-01-10 MED ORDER — DABIGATRAN ETEXILATE MESYLATE 150 MG PO CAPS
150.0000 mg | ORAL_CAPSULE | Freq: Two times a day (BID) | ORAL | Status: DC
  Administered 2015-01-10 – 2015-01-12 (×4): 150 mg via ORAL
  Filled 2015-01-10 (×5): qty 1

## 2015-01-10 NOTE — H&P (Signed)
PATIENT DETAILS Name: [name] [name] Age: 74 y.o. Sex: male Date of Birth: Feb 16, 1941 Admit Date: 01/10/2015 ZOX:WRUEAV,WUJW, MD   CHIEF COMPLAINT:  Right-sided weakness since waking up this morning  HPI: [name] [name] is a 74 y.o. male with a Past Medical History of history of CVA, history of A. fib on anticoagulation, history of chronic systolic heart failure, history of CAD, history of AICD/pacemaker implantation, history of AAA under surveillance who presents today with the above noted complaint. Patient was just recently discharged from this facility on 12/25/14 after he was hospitalized for right-sided weakness. He was initially on Coumadin and change to Pradaxa on discharge. He presents today, as he woke up earlier this morning with more right-sided weakness in his usual baseline. Per patient, he noted he was "staggering" when he walked to his bathroom this morning. He subsequently had 2 episodes of vomiting. He continued to have staggering gait, and he noticed that he was being pulled to the right. He thinks that his usual right-sided weakness has worsened much more than his recent baseline. He subsequently was brought to the emergency room, repeat CT scan of the head was negative for acute abnormality, I was asked to admit this patient for further evaluation and treatment   ALLERGIES:  No Known Allergies  PAST MEDICAL HISTORY: Past Medical History  Diagnosis Date  . Atrial fibrillation     s/p prior DCCV;  Amiodarone Rx.  . CAD (coronary artery disease)     s/p CABG;  cath 10/07:  LM ok, LAD occluded, AV CFX 95%, pRCA occluded; L-LAD, S-OM2/OM3, S-PDA ok  . Cerebrovascular disease     s/p prior Left CEA;  followed by Dr. [name]  . COPD (chronic obstructive pulmonary disease)   . Hyperlipidemia   . Hypertension   . Mitral insufficiency     hx of. not noted on echo 01/2012  . Nephrolithiasis   . Ischemic cardiomyopathy     echo 5/09: EF 55%, mild LAE;  Myoview  11/10: inf and apical scar with mild peri-infarct ischemia, EF 44%. Echo 01/26/12 with EF 20-25% but previously known to be 30% on echoes before last one  . Iliac artery aneurysm, left     followed by Dr. [name]  . Aortic dissection     H/O focal aortic dissection  . LBBB (left bundle branch block)   . CHF (congestive heart failure)   . Myocardial infarction   . Renal insufficiency     01/2012  . Stroke     PAST SURGICAL HISTORY: Past Surgical History  Procedure Laterality Date  . Uteroscopy    . Coronary artery bypass graft  1998  . Left cea  07/05  . Appendectomy  09/11/11  . Carotid endarterectomy  2005    left  . Biventricular defibrillator implantation  08/22/12    SJM Quadra Assura BiV ICD implanted by Dr [name]  . Spine surgery  Sep 27, 2014    Lower Back L 4  and   L 5  . Bi-ventricular implantable cardioverter defibrillator N/A 08/22/2012    Procedure: BI-VENTRICULAR IMPLANTABLE CARDIOVERTER DEFIBRILLATOR  (CRT-D);  Surgeon: [name], MD;  Location: [facility] CATH LAB;  Service: Cardiovascular;  Laterality: N/A;    MEDICATIONS AT HOME: Prior to Admission medications   Medication Sig Start Date End Date Taking? Authorizing Provider  amiodarone (PACERONE) 200 MG tablet TAKE 1 TABLET EVERY DAY Patient taking differently: TAKE 1 TABLET BY MOUTH EVERY DAY 08/24/14  Yes Lewayne Bunting, MD  amLODipine (NORVASC) 10 MG tablet Take 1 tablet (10 mg total) by mouth daily. 04/30/14  Yes Lewayne Bunting, MD  dabigatran (PRADAXA) 150 MG CAPS capsule Take 1 capsule (150 mg total) by mouth 2 (two) times daily. 12/25/14  Yes Costin Otelia Sergeant, MD  docusate sodium (COLACE) 100 MG capsule Take 1 capsule (100 mg total) by mouth every 12 (twelve) hours. 06/04/14  Yes Rolland Porter, MD  furosemide (LASIX) 40 MG tablet Take 40 mg by mouth daily as needed for fluid or edema.  08/29/13  Yes Lewayne Bunting, MD  hydrALAZINE (APRESOLINE) 50 MG tablet Take 0.5 tablets (25 mg total) by mouth 3 (three) times  daily. 12/15/14  Yes Lewayne Bunting, MD  ipratropium-albuterol (DUONEB) 0.5-2.5 (3) MG/3ML SOLN Take 3 mLs by nebulization every 4 (four) hours as needed (for shortness of breath).   Yes Historical Provider, MD  lisinopril (PRINIVIL,ZESTRIL) 40 MG tablet TAKE 1 TABLET EVERY DAY 12/29/14  Yes Lewayne Bunting, MD  metoprolol succinate (TOPROL-XL) 25 MG 24 hr tablet Take 1 tablet (25 mg total) by mouth daily. Patient taking differently: Take 12.5 mg by mouth daily.  12/15/14  Yes Lewayne Bunting, MD  Multiple Vitamin (MULITIVITAMIN WITH MINERALS) TABS Take 1 tablet by mouth daily.   Yes Historical Provider, MD  naproxen sodium (ALEVE) 220 MG tablet Take 440 mg by mouth 2 (two) times daily as needed (for pain).   Yes Historical Provider, MD  niacin (NIASPAN) 500 MG CR tablet Take 1,000 mg by mouth at bedtime.    Yes Historical Provider, MD  potassium chloride SA (K-DUR,KLOR-CON) 20 MEQ tablet TAKE 2 TABLETS THREE TIMES DAILY 11/11/14  Yes Lewayne Bunting, MD  pravastatin (PRAVACHOL) 80 MG tablet Take 1 tablet (80 mg total) by mouth daily. 07/24/14  Yes Lewayne Bunting, MD  albuterol (PROVENTIL HFA;VENTOLIN HFA) 108 (90 BASE) MCG/ACT inhaler Inhale 2 puffs into the lungs every 4 (four) hours as needed for wheezing. 03/29/13   Suzi Roots, MD    FAMILY HISTORY: Family History  Problem Relation Age of Onset  . Stroke Maternal Uncle     SOCIAL HISTORY:  reports that he quit smoking about 10 years ago. His smoking use included Cigarettes. He has a 85.5 pack-year smoking history. He has never used smokeless tobacco. He reports that he does not drink alcohol or use illicit drugs.  REVIEW OF SYSTEMS:  Constitutional:   No  weight loss, night sweats,  Fevers, chills, fatigue.  HEENT:    No headaches, Difficulty swallowing,Tooth/dental problems,Sore throat,   Cardio-vascular: No chest pain,  Orthopnea, PND, swelling in lower extremities, anasarca  GI:  No heartburn, indigestion, abdominal pain,  nausea, vomiting, diarrhea, change in  bowel habits, loss of appetite  Resp: No shortness of breath with exertion or at rest.  No excess mucus, no productive cough, No non-productive cough,    Skin:  no rash or lesions.  GU:  no dysuria, change in color of urine, no urgency or frequency.  No flank pain.  Musculoskeletal: No joint pain or swelling.  No decreased range of motion.  No back pain.  Psych: No change in mood or affect. No depression or anxiety.  No memory loss.   PHYSICAL EXAM: Blood pressure 153/64, pulse 68, temperature 98 F (36.7 C), temperature source Oral, resp. rate 12, SpO2 95 %.  General appearance :Awake, alert, not in any distress. Speech Clear. Not toxic Looking HEENT: Atraumatic and Normocephalic,  pupils equally reactive to light and accomodation Neck: supple, no JVD. No cervical lymphadenopathy.  Chest:Good air entry bilaterally, no added sounds  CVS: S1 S2 regular, no murmurs.  Abdomen: Bowel sounds present, Non tender and not distended with no gaurding, rigidity or rebound. Extremities: B/L Lower Ext shows no edema, both legs are warm to touch Neurology: Right lower extremity 3/5, right upper extremity 4/5, left upper/left lower extremity 5/5 Skin:No Rash Wounds:N/A  LABS ON ADMISSION:   Recent Labs  01/10/15 1010 01/10/15 1047  NA 145 146*  K 3.8 3.7  CL 111 107  CO2 25  --   GLUCOSE 104* 97  BUN 12 12  CREATININE 1.03 1.00  CALCIUM 8.7  --     Recent Labs  01/10/15 1010  AST 18  ALT 13  ALKPHOS 86  BILITOT 0.9  PROT 6.0  ALBUMIN 3.5   No results for input(s): LIPASE, AMYLASE in the last 72 hours.  Recent Labs  01/10/15 1010 01/10/15 1047  WBC 6.0  --   NEUTROABS 3.7  --   HGB 14.8 16.3  HCT 45.6 48.0  MCV 92.7  --   PLT 149*  --    No results for input(s): CKTOTAL, CKMB, CKMBINDEX, TROPONINI in the last 72 hours. No results for input(s): DDIMER in the last 72 hours. Invalid input(s): POCBNP   RADIOLOGIC STUDIES  ON ADMISSION: Dg Chest 2 View  01/10/2015   CLINICAL DATA:  Patient states that since last night he has been having right sided numbness with pains and dizziness, unable to stand for exam due to weakness  EXAM: CHEST  2 VIEW  COMPARISON:  12/16/2014  FINDINGS: Changes from cardiac surgery are stable. Cardiac silhouette is mildly enlarged. No mediastinal or hilar masses or convincing adenopathy.  Lungs are mildly hyperexpanded. No lung consolidation or edema. No pleural effusion or pneumothorax.  Left anterior chest wall biventricular cardioverter-defibrillator appear stable  Bony thorax is demineralized but grossly intact.  IMPRESSION: No acute cardiopulmonary disease.   Electronically Signed   By: Amie Portland M.D.   On: 01/10/2015 12:08   Ct Head Wo Contrast  01/10/2015   CLINICAL DATA:  Right sided extremity weakness, Right facial droop, and dizziness upon waking this morning at 0400am. Pt states he felt fine when he went to bed last night Hx: A-fib, CAD, HTN, CHF, MI, CVA  EXAM: CT HEAD WITHOUT CONTRAST  TECHNIQUE: Contiguous axial images were obtained from the base of the skull through the vertex without intravenous contrast.  COMPARISON:  12/24/2014  FINDINGS: Ventricles are normal in configuration. There is ventricular and sulcal enlargement reflecting age related volume loss. No hydrocephalus.  No parenchymal masses or mass effect.  There is no evidence of a cortical infarct.  Patchy areas of white matter hypoattenuation noted consistent with mild chronic microvascular ischemic change.  There are no extra-axial masses or abnormal fluid collections.  There is no intracranial hemorrhage.  There is sinus disease. Left frontal sinus is opacified. There is mucosal thickening with some dependent fluid in multiple ethmoid air cells. Dependent fluid and mucosal thickening is noted in the left maxillary sinus. There is polypoid mucosal thickening in the right maxillary sinus and a mucous retention cyst in the  right sphenoid sinus.  Clear mastoid air cells.  IMPRESSION: 1. No acute intracranial abnormalities. 2. Age related volume loss and mild chronic microvascular ischemic change. 3. Significant sinus disease as detailed above.   Electronically Signed   By: Renard Hamper.D.  On: 01/10/2015 10:27     EKG: Independently reviewed. Paced rhythm  ASSESSMENT AND PLAN: Present on Admission:  . Suspected acute CVA: Admit to neuro telemetry. Patient recently had an extensive workup therefore we will not repeat further radiological studies, however we will repeat a CT scan of the head tomorrow morning I will continue Pradaxa for now, and await further recommendations from neurology.   [name] COPD (chronic obstructive pulmonary disease): Lungs are clear, continue as needed bronchodilators   . HYPERTENSION, BENIGN: I will allow some permissive hypertension today, resume in 2 hypertensive state tomorrow. Follow BP and adjust/titrate medications accordingly.  [name] HYPERCHOLESTEROLEMIA, PURE: Continue with statin. since recently had a lipid panel, will not reorder   . Automatic implantable cardioverter-defibrillator in situ: Monitor in telemetry   . Chronic systolic heart failure: Clinically compensated. Monitor closely.  Further plan will depend as patient's clinical course evolves and further radiologic and laboratory data become available. Patient will be monitored closely.  Above noted plan was discussed with patientdaughters, they were in agreement.   DVT Prophylaxis: On Pradaxa  Code Status: Full Code  Disposition Plan: Home when stable  Total time spent for admission equals 45 minutes.  [facility] Pager [phone]  If 7PM-7AM, please contact night-coverage [email or web address] Password [email or web address] 01/10/2015, 1:23 PM

## 2015-01-10 NOTE — ED Notes (Signed)
Patient transported to X-ray 

## 2015-01-10 NOTE — ED Notes (Signed)
Patient transported to CT 

## 2015-01-10 NOTE — Consult Note (Signed)
Referring Physician: Dr [name] [name]    Chief Complaint: Increased right-sided weakness  HPI: [name] [name] is a 74 y.o. male previously admitted to [facility] from 12/24/2014 to 12/25/2014 for evaluation of dizziness and right hemiparesis with hemisensory loss.  The patient has an implantable defibrillator so he could not have an MRI. Head CT, as well as CT angiogram of the head and neck did not reveal any significant findings. Other history for this patient includes atrial fibrillation on Pradaxa, coronary artery disease, previous left carotid endarterectomy, AAA, COPD, hyperlipidemia, hypertension, ischemic cardiomyopathy, and previous stroke. He is on Pradaxa for atrial fibrillation and denies missing any doses. Prior to his previous stroke he was on warfarin.  Today the patient was readmitted to [facility] for evaluation of right hemiparesis noted after awakening this morning. A CT of the head today did not show any acute abnormalities.He is on Pradaxa for atrial fibrillation and denies missing any doses. Prior to his previous stroke he was on warfarin.  The patient reports that he woke up at 4 AM and did not feel well. He went to the bathroom and vomited, felt unsteady on his feet, so he went back to bed. He got up again at 7 AM and vomited again. He checked his blood pressure - systolic was 225. He does not remember diastolic. He noted unsteadiness and right-sided weakness. He took his a.m. meds. His blood pressure remained elevated. He noted numbness and tingling on the right side. He was brought to [facility] emergency department as a code stroke and admitted. He has an appointment to see his cardiologist Dr. Jens [name] tomorrow morning. I sent a page to the physician assistant informing them of the patient's admission.  Date last known well: Jan 09, 2015 Time last known well: Unable to determine tPA Given: No: Late presentation with unknown time of onset.  Past Medical  History  Diagnosis Date  . Atrial fibrillation     s/p prior DCCV;  Amiodarone Rx.  . CAD (coronary artery disease)     s/p CABG;  cath 10/07:  LM ok, LAD occluded, AV CFX 95%, pRCA occluded; L-LAD, S-OM2/OM3, S-PDA ok  . Cerebrovascular disease     s/p prior Left CEA;  followed by Dr. Arbie [name]  . COPD (chronic obstructive pulmonary disease)   . Hyperlipidemia   . Hypertension   . Mitral insufficiency     hx of. not noted on echo 01/2012  . Nephrolithiasis   . Ischemic cardiomyopathy     echo 5/09: EF 55%, mild LAE;  Myoview 11/10: inf and apical scar with mild peri-infarct ischemia, EF 44%. Echo 01/26/12 with EF 20-25% but previously known to be 30% on echoes before last one  . Iliac artery aneurysm, left     followed by Dr. Arbie [name]  . Aortic dissection     H/O focal aortic dissection  . LBBB (left bundle branch block)   . CHF (congestive heart failure)   . Myocardial infarction   . Renal insufficiency     01/2012  . Stroke     Past Surgical History  Procedure Laterality Date  . Uteroscopy    . Coronary artery bypass graft  1998  . Left cea  07/05  . Appendectomy  09/11/11  . Carotid endarterectomy  2005    left  . Biventricular defibrillator implantation  08/22/12    SJM Quadra Assura BiV ICD implanted by Dr [name] [name]  . Spine surgery  Sep 27, 2014  Lower Back L 4  and   L 5  . Bi-ventricular implantable cardioverter defibrillator N/A 08/22/2012    Procedure: BI-VENTRICULAR IMPLANTABLE CARDIOVERTER DEFIBRILLATOR  (CRT-D);  Surgeon: [name], MD;  Location: [facility]ATH [facility];  Service: Cardiovascular;  Laterality: N/A;    Family History  Problem Relation Age of Onset  . Stroke Maternal Uncle    Social History:  reports that he quit smoking about 10 years ago. His smoking use included Cigarettes. He has a 85.5 pack-year smoking history. He has never used smokeless tobacco. He reports that he does not drink alcohol or use illicit drugs.  Allergies: No Known  Allergies  Medications:  Scheduled: .  stroke: mapping our early stages of recovery book   Does not apply Once  . amiodarone  200 mg Oral Daily  . [START ON 01/11/2015] amLODipine  10 mg Oral Daily  . dabigatran  150 mg Oral BID  . [START ON 01/11/2015] hydrALAZINE  25 mg Oral TID  . [START ON 01/11/2015] lisinopril  40 mg Oral Daily  . [START ON 01/11/2015] metoprolol succinate  12.5 mg Oral Daily  . [START ON 01/11/2015] multivitamin with minerals  1 tablet Oral Daily  . niacin  1,000 mg Oral QHS  . [START ON 01/11/2015] potassium chloride SA  20 mEq Oral Daily  . pravastatin  80 mg Oral Daily  . sodium chloride  3 mL Intravenous Q12H    ROS: History obtained from the patient and family  General ROS: negative for - chills, fatigue, fever, night sweats, weight gain or weight loss. Positive for leaning to the right well ambulating. Psychological ROS: negative for - behavioral disorder, hallucinations, memory difficulties, mood swings or suicidal ideation Ophthalmic ROS: negative for -  double vision, eye pain or loss of vision. Positive for right eye twitching with blurred vision of the right eye. ENT ROS: negative for - epistaxis, nasal discharge, oral lesions, sore throat, tinnitus or vertigo Allergy and Immunology ROS: negative for - hives or itchy/watery eyes Hematological and Lymphatic ROS: negative for - bleeding problems, bruising or swollen lymph nodes Endocrine ROS: negative for - galactorrhea, hair pattern changes, polydipsia/polyuria or temperature intolerance Respiratory ROS: negative for - cough, hemoptysis, or wheezing. Positive for chronic dyspnea. Cardiovascular ROS: negative for - chest pain, dyspnea on exertion, edema or irregular heartbeat Gastrointestinal ROS: negative for - abdominal pain, diarrhea, hematemesis, nausea/vomiting or stool incontinence Genito-Urinary ROS: negative for - dysuria, hematuria, incontinence or urinary frequency/urgency Musculoskeletal ROS: negative  for - joint swelling or muscular weakness Neurological ROS: as noted in HPI Dermatological ROS: negative for rash and skin lesion changes   Physical Examination: Blood pressure 137/65, pulse 68, temperature 98.3 F (36.8 C), temperature source Oral, resp. rate 18, SpO2 97 %.  Mental Status: Alert, oriented, thought content appropriate.  Speech fluent without evidence of aphasia.  Able to follow 3 step commands without difficulty. Cranial Nerves: II: Discs not visualized; Visual fields grossly normal, pupils equal, round, reactive to light and accommodation III,IV, VI: ptosis not present, extra-ocular motions intact bilaterally V,VII: Mild lower right facial droop, facial light touch sensation decreased on the right. VIII: hearing normal bilaterally IX,X: gag reflex present XI: bilateral shoulder shrug XII: midline tongue extension Motor: Right : Upper extremity   4/5    Left:     Upper extremity   5/5  Lower extremity   4/5     Lower extremity   5/5 Tone and bulk:normal tone throughout; no atrophy noted Sensory: Decreased sensation  to light touch in both the right upper and lower extremities. Deep Tendon Reflexes: 2+ and symmetric throughout Plantars: Right: downgoing   Left: downgoing Cerebellar: Finger to nose testing with bilateral ataxia right greater than left. Rapid alternating movements performed with difficulty with the right upper extremity. Unable to perform heel to knee with the right leg. Left leg normal Gait: Deferred CV: Pedal pulses weak to absent bilaterally.  Laboratory Studies:  Basic Metabolic Panel:  Recent Labs Lab 01/10/15 1010 01/10/15 1047  NA 145 146*  K 3.8 3.7  CL 111 107  CO2 25  --   GLUCOSE 104* 97  BUN 12 12  CREATININE 1.03 1.00  CALCIUM 8.7  --     Liver Function Tests:  Recent Labs Lab 01/10/15 1010  AST 18  ALT 13  ALKPHOS 86  BILITOT 0.9  PROT 6.0  ALBUMIN 3.5   No results for input(s): LIPASE, AMYLASE in the last 168  hours. No results for input(s): AMMONIA in the last 168 hours.  CBC:  Recent Labs Lab 01/10/15 1010 01/10/15 1047  WBC 6.0  --   NEUTROABS 3.7  --   HGB 14.8 16.3  HCT 45.6 48.0  MCV 92.7  --   PLT 149*  --     Cardiac Enzymes: No results for input(s): CKTOTAL, CKMB, CKMBINDEX, TROPONINI in the last 168 hours.  BNP: Invalid input(s): POCBNP  CBG:  Recent Labs Lab 01/10/15 0940  GLUCAP 94    Microbiology: Results for orders placed or performed during the hospital encounter of 08/22/12  Surgical pcr screen     Status: None   Collection Time: 08/22/12  6:48 AM  Result Value Ref [name] Status   MRSA, PCR NEGATIVE NEGATIVE Final   Staphylococcus aureus NEGATIVE NEGATIVE Final    Comment:        The Xpert SA Assay (FDA approved for NASAL specimens in patients over 47 years of age), is one component of a comprehensive surveillance program.  Test performance has been validated by [employer] for patients greater than or equal to 37 year old. It is not intended to diagnose infection nor to guide or monitor treatment.    Coagulation Studies:  Recent Labs  01/10/15 1010  LABPROT 16.5*  INR 1.32    Urinalysis:  Recent Labs Lab 01/10/15 1354  COLORURINE YELLOW  LABSPEC 1.019  PHURINE 7.0  GLUCOSEU NEGATIVE  HGBUR NEGATIVE  BILIRUBINUR NEGATIVE  KETONESUR NEGATIVE  PROTEINUR 100*  UROBILINOGEN 1.0  NITRITE NEGATIVE  LEUKOCYTESUR NEGATIVE    Lipid Panel:    Component Value Date/Time   CHOL 144 12/24/2014 0333   TRIG 154* 12/24/2014 0333   HDL 38* 12/24/2014 0333   CHOLHDL 3.8 12/24/2014 0333   VLDL 31 12/24/2014 0333   LDLCALC 75 12/24/2014 0333    HgbA1C:  Lab Results  Component Value Date   HGBA1C 5.5 12/24/2014    Urine Drug Screen:     Component Value Date/Time   LABOPIA NONE DETECTED 01/14/2013 0417   COCAINSCRNUR NONE DETECTED 01/14/2013 0417   LABBENZ NONE DETECTED 01/14/2013 0417   AMPHETMU NONE DETECTED 01/14/2013 0417    THCU NONE DETECTED 01/14/2013 0417   LABBARB NONE DETECTED 01/14/2013 0417    Alcohol Level: No results for input(s): ETH in the last 168 hours.  Other results: EKG: Paced rhythm rate 62 bpm  Imaging:  Dg Chest 2 View 01/10/2015    No acute cardiopulmonary disease.     Ct Head Wo Contrast 01/10/2015  1. No acute intracranial abnormalities.  2. Age related volume loss and mild chronic microvascular ischemic change.  3. Significant sinus disease as detailed above.       Assessment: 74 y.o. male with recurrent unsteadiness, nausea vomiting, right hemiparesis, and right hemisensory deficits similar to what he experienced with a stroke earlier this month. He is on Pradaxa for atrial fibrillation and denies missing any doses. Prior to his previous stroke he was on warfarin. Dr. Jens [name] is the patient's cardiologist.  Stroke Risk Factors - atrial fibrillation, family history, hyperlipidemia, hypertension and coronary artery disease and previous stroke  Plan: 1. HgbA1c, fasting lipid panel 2. MRI, MRA - not possible secondary to ICD/pacemaker 3. PT consult, OT consult, Speech consult 6. Prophylactic therapy-Pradaxa 7. NPO until RN stroke swallow screen 8. Telemetry monitoring 9. Frequent neuro checks     Hassel Neth [facility] Pager [phone] 01/10/2015, 4:59 PM   Patient seen and evaluated. Agree with above assessment and plan. In brief, patient is a 73y/o gentleman with multiple risk factors admitted with right sided motor and sensory symptoms similar to prior stroke earlier this month. Will admit for further monitoring and workup.

## 2015-01-10 NOTE — ED Notes (Signed)
Patient returned from CT

## 2015-01-10 NOTE — ED Provider Notes (Addendum)
CSN: [id number]     Arrival date & time 01/10/15  4098 History   First MD Initiated Contact with Patient 01/10/15 [phone]     Chief Complaint  Patient presents with  . Weakness     (Consider location/radiation/quality/duration/timing/severity/associated sxs/prior Treatment) Patient is a 74 y.o. male presenting with weakness. The history is provided by the patient.  Weakness Associated symptoms include shortness of breath. Pertinent negatives include no abdominal pain and no headaches.  pt with hx copd, cva, ischemic cm, c/o onset dizziness, nv, right sided weakness, and unsteadiness of gait at approximately 4 am. States felt normal about 1030 last pm, but then got up at 4 am, had episode nv, felt as if 'staggered to bathroom', leaning to right, ?right weakness.  +dizziness. Denies ear pain, hearing loss or tinnitus. No room spinning sensation.  States he awoke later w episode nv, checked bp and it was high, tossed and turned all night, and when got up this morning felt no better. Pt unsure of color of emesis, stating he did not turn on the light. No abd pain. No diarrhea or constipation. No abd distension. No known ill contacts or bad food ingestion. Also states feels his copd is acting up, recent non prod cough, and has felt congested/tight in chest. States compliant w normal meds. No headaches. No fever or chills.  Pt denies episodic cp or exertional cp, but states that w cough has felt tight in chest.      Past Medical History  Diagnosis Date  . Atrial fibrillation     s/p prior DCCV;  Amiodarone Rx.  . CAD (coronary artery disease)     s/p CABG;  cath 10/07:  LM ok, LAD occluded, AV CFX 95%, pRCA occluded; L-LAD, S-OM2/OM3, S-PDA ok  . Cerebrovascular disease     s/p prior Left CEA;  followed by Dr. [name]  . COPD (chronic obstructive pulmonary disease)   . Hyperlipidemia   . Hypertension   . Mitral insufficiency     hx of. not noted on echo 01/2012  . Nephrolithiasis   . Ischemic  cardiomyopathy     echo 5/09: EF 55%, mild LAE;  Myoview 11/10: inf and apical scar with mild peri-infarct ischemia, EF 44%. Echo 01/26/12 with EF 20-25% but previously known to be 30% on echoes before last one  . Iliac artery aneurysm, left     followed by Dr. [name]  . Aortic dissection     H/O focal aortic dissection  . LBBB (left bundle branch block)   . CHF (congestive heart failure)   . Myocardial infarction   . Renal insufficiency     01/2012  . Stroke    Past Surgical History  Procedure Laterality Date  . Uteroscopy    . Coronary artery bypass graft  1998  . Left cea  07/05  . Appendectomy  09/11/11  . Carotid endarterectomy  2005    left  . Biventricular defibrillator implantation  08/22/12    SJM Quadra Assura BiV ICD implanted by Dr [name]  . Spine surgery  Sep 27, 2014    Lower Back L 4  and   L 5  . Bi-ventricular implantable cardioverter defibrillator N/A 08/22/2012    Procedure: BI-VENTRICULAR IMPLANTABLE CARDIOVERTER DEFIBRILLATOR  (CRT-D);  Surgeon: [name], MD;  Location: [facility] CATH LAB;  Service: Cardiovascular;  Laterality: N/A;   Family History  Problem Relation Age of Onset  . Stroke Maternal Uncle    History  Substance Use Topics  .  Smoking status: Former Smoker -- 1.50 packs/day for 57 years    Types: Cigarettes    Quit date: 12/11/2004  . Smokeless tobacco: Never Used  . Alcohol Use: No     Comment: 6 yrs ago     Review of Systems  Constitutional: Negative for fever and chills.  HENT: Negative for rhinorrhea and sore throat.   Eyes: Negative for redness.  Respiratory: Positive for cough and shortness of breath.   Cardiovascular: Negative for palpitations and leg swelling.  Gastrointestinal: Positive for nausea and vomiting. Negative for abdominal pain, diarrhea, constipation and abdominal distention.  Genitourinary: Negative for dysuria and flank pain.  Musculoskeletal: Negative for back pain and neck pain.  Skin: Negative for rash.   Neurological: Positive for weakness. Negative for syncope and headaches.  Hematological: Does not bruise/bleed easily.  Psychiatric/Behavioral: Negative for confusion.      Allergies  Review of patient's allergies indicates no known allergies.  Home Medications   Prior to Admission medications   Medication Sig Start Date End Date Taking? Authorizing Provider  albuterol (PROVENTIL HFA;VENTOLIN HFA) 108 (90 BASE) MCG/ACT inhaler Inhale 2 puffs into the lungs every 4 (four) hours as needed for wheezing. 03/29/13   Suzi Roots, MD  amiodarone (PACERONE) 200 MG tablet TAKE 1 TABLET EVERY DAY Patient taking differently: TAKE 1 TABLET BY MOUTH EVERY DAY 08/24/14   Lewayne Bunting, MD  amLODipine (NORVASC) 10 MG tablet Take 1 tablet (10 mg total) by mouth daily. 04/30/14   Lewayne Bunting, MD  dabigatran (PRADAXA) 150 MG CAPS capsule Take 1 capsule (150 mg total) by mouth 2 (two) times daily. 12/25/14   Costin Otelia Sergeant, MD  docusate sodium (COLACE) 100 MG capsule Take 1 capsule (100 mg total) by mouth every 12 (twelve) hours. 06/04/14   Rolland Porter, MD  furosemide (LASIX) 40 MG tablet Take 40 mg by mouth daily as needed for fluid or edema.  08/29/13   Lewayne Bunting, MD  hydrALAZINE (APRESOLINE) 50 MG tablet Take 0.5 tablets (25 mg total) by mouth 3 (three) times daily. 12/15/14   Lewayne Bunting, MD  ipratropium-albuterol (DUONEB) 0.5-2.5 (3) MG/3ML SOLN Take 3 mLs by nebulization every 4 (four) hours as needed (for shortness of breath).    Historical Provider, MD  lisinopril (PRINIVIL,ZESTRIL) 40 MG tablet TAKE 1 TABLET EVERY DAY 12/29/14   Lewayne Bunting, MD  magnesium citrate SOLN Take 296 mLs (1 Bottle total) by mouth once. 06/04/14   Rolland Porter, MD  metoprolol succinate (TOPROL-XL) 25 MG 24 hr tablet Take 1 tablet (25 mg total) by mouth daily. Patient taking differently: Take 12.5 mg by mouth daily.  12/15/14   Lewayne Bunting, MD  Multiple Vitamin (MULITIVITAMIN WITH MINERALS) TABS Take 1  tablet by mouth daily.    Historical Provider, MD  naproxen sodium (ALEVE) 220 MG tablet Take 440 mg by mouth 2 (two) times daily as needed (for pain).    Historical Provider, MD  niacin (NIASPAN) 500 MG CR tablet Take 1,000 mg by mouth 2 (two) times daily.     Historical Provider, MD  potassium chloride SA (K-DUR,KLOR-CON) 20 MEQ tablet TAKE 2 TABLETS THREE TIMES DAILY 11/11/14   Lewayne Bunting, MD  pravastatin (PRAVACHOL) 80 MG tablet Take 1 tablet (80 mg total) by mouth daily. 07/24/14   Lewayne Bunting, MD   BP 159/74 mmHg  Pulse 70  Temp(Src) 98 F (36.7 C) (Oral)  Resp 18  SpO2 97% Physical Exam  Constitutional: He is oriented to person, place, and time. He appears well-developed and well-nourished. No distress.  HENT:  Mouth/Throat: Oropharynx is clear and moist.  Eyes: Conjunctivae are normal. Pupils are equal, round, and reactive to light.  Neck: Neck supple. No tracheal deviation present.  No bruit  Cardiovascular: Normal rate, regular rhythm, normal heart sounds and intact distal pulses.   Pulmonary/Chest: Effort normal and breath sounds normal. No accessory muscle usage. No respiratory distress. He exhibits no tenderness.  Abdominal: Soft. Bowel sounds are normal. He exhibits no distension and no mass. There is no tenderness. There is no rebound and no guarding.  obese  Genitourinary:  No cva tenderness  Musculoskeletal: Normal range of motion. He exhibits no edema or tenderness.  Neurological: He is alert and oriented to person, place, and time. No cranial nerve deficit.  Motor intact bil, stre 5/5. sens grossly intact. No pronator drift.  No nystagmus. Pt w very unsteady gait, recurrently leaning/falling towards right.   Skin: Skin is warm and dry. He is not diaphoretic.  Psychiatric: He has a normal mood and affect.  Nursing note and vitals reviewed.   ED Course  Procedures (including critical care time) Labs Review   Results for orders placed or performed during  the hospital encounter of 01/10/15  Protime-INR  Result Value Ref Range   Prothrombin Time 16.5 (H) 11.6 - 15.2 seconds   INR 1.32 0.00 - 1.49  APTT  Result Value Ref Range   aPTT 49 (H) 24 - 37 seconds  CBC  Result Value Ref Range   WBC 6.0 4.0 - 10.5 K/uL   RBC 4.92 4.22 - 5.81 MIL/uL   Hemoglobin 14.8 13.0 - 17.0 g/dL   HCT 16.1 09.6 - 04.5 %   MCV 92.7 78.0 - 100.0 fL   MCH 30.1 26.0 - 34.0 pg   MCHC 32.5 30.0 - 36.0 g/dL   RDW 40.9 81.1 - 91.4 %   Platelets 149 (L) 150 - 400 K/uL  Differential  Result Value Ref Range   Neutrophils Relative % 61 43 - 77 %   Neutro Abs 3.7 1.7 - 7.7 K/uL   Lymphocytes Relative 25 12 - 46 %   Lymphs Abs 1.5 0.7 - 4.0 K/uL   Monocytes Relative 8 3 - 12 %   Monocytes Absolute 0.5 0.1 - 1.0 K/uL   Eosinophils Relative 5 0 - 5 %   Eosinophils Absolute 0.3 0.0 - 0.7 K/uL   Basophils Relative 1 0 - 1 %   Basophils Absolute 0.0 0.0 - 0.1 K/uL  Comprehensive metabolic panel  Result Value Ref Range   Sodium 145 135 - 145 mmol/L   Potassium 3.8 3.5 - 5.1 mmol/L   Chloride 111 96 - 112 mmol/L   CO2 25 19 - 32 mmol/L   Glucose, Bld 104 (H) 70 - 99 mg/dL   BUN 12 6 - 23 mg/dL   Creatinine, Ser 7.82 0.50 - 1.35 mg/dL   Calcium 8.7 8.4 - 95.6 mg/dL   Total Protein 6.0 6.0 - 8.3 g/dL   Albumin 3.5 3.5 - 5.2 g/dL   AST 18 0 - 37 U/L   ALT 13 0 - 53 U/L   Alkaline Phosphatase 86 39 - 117 U/L   Total Bilirubin 0.9 0.3 - 1.2 mg/dL   GFR calc non Af Amer 71 (L) >90 mL/min   GFR calc Af Amer 82 (L) >90 mL/min   Anion gap 9 5 - 15  I-Stat Chem 8, ED  Result Value Ref Range   Sodium 146 (H) 135 - 145 mmol/L   Potassium 3.7 3.5 - 5.1 mmol/L   Chloride 107 96 - 112 mmol/L   BUN 12 6 - 23 mg/dL   Creatinine, Ser 4.09 0.50 - 1.35 mg/dL   Glucose, Bld 97 70 - 99 mg/dL   Calcium, Ion 8.11 9.14 - 1.30 mmol/L   TCO2 25 0 - 100 mmol/L   Hemoglobin 16.3 13.0 - 17.0 g/dL   HCT 78.2 95.6 - 21.3 %  I-Stat Troponin, ED (not at [facility])  Result Value Ref Range    Troponin i, poc 0.01 0.00 - 0.08 ng/mL   Comment 3          CBG monitoring, ED  Result Value Ref Range   Glucose-Capillary 94 70 - 99 mg/dL    Dg Chest 2 View  0/86/5784   CLINICAL DATA:  Patient states that since last night he has been having right sided numbness with pains and dizziness, unable to stand for exam due to weakness  EXAM: CHEST  2 VIEW  COMPARISON:  12/16/2014  FINDINGS: Changes from cardiac surgery are stable. Cardiac silhouette is mildly enlarged. No mediastinal or hilar masses or convincing adenopathy.  Lungs are mildly hyperexpanded. No lung consolidation or edema. No pleural effusion or pneumothorax.  Left anterior chest wall biventricular cardioverter-defibrillator appear stable  Bony thorax is demineralized but grossly intact.  IMPRESSION: No acute cardiopulmonary disease.   Electronically Signed   By: Amie Portland M.D.   On: 01/10/2015 12:08   Ct Head Wo Contrast  01/10/2015   CLINICAL DATA:  Right sided extremity weakness, Right facial droop, and dizziness upon waking this morning at 0400am. Pt states he felt fine when he went to bed last night Hx: A-fib, CAD, HTN, CHF, MI, CVA  EXAM: CT HEAD WITHOUT CONTRAST  TECHNIQUE: Contiguous axial images were obtained from the base of the skull through the vertex without intravenous contrast.  COMPARISON:  12/24/2014  FINDINGS: Ventricles are normal in configuration. There is ventricular and sulcal enlargement reflecting age related volume loss. No hydrocephalus.  No parenchymal masses or mass effect.  There is no evidence of a cortical infarct.  Patchy areas of white matter hypoattenuation noted consistent with mild chronic microvascular ischemic change.  There are no extra-axial masses or abnormal fluid collections.  There is no intracranial hemorrhage.  There is sinus disease. Left frontal sinus is opacified. There is mucosal thickening with some dependent fluid in multiple ethmoid air cells. Dependent fluid and mucosal thickening is  noted in the left maxillary sinus. There is polypoid mucosal thickening in the right maxillary sinus and a mucous retention cyst in the right sphenoid sinus.  Clear mastoid air cells.  IMPRESSION: 1. No acute intracranial abnormalities. 2. Age related volume loss and mild chronic microvascular ischemic change. 3. Significant sinus disease as detailed above.   Electronically Signed   By: Amie Portland M.D.   On: 01/10/2015 10:27        EKG Interpretation   Date/Time:  Sunday January 10 2015 09:28:39 EST Ventricular Rate:  62 PR Interval:  208 QRS Duration: 128 QT Interval:  475 QTC Calculation: 482 R Axis:   -21 Text Interpretation:  Atrial-sensed ventricular-paced rhythm No  significant change since last tracing Confirmed by Denton Lank  MD, Caryn Bee  ([id number]) on 01/10/2015 9:40:19 AM      MDM   Iv ns. Continuous pulse ox and monitor. o2 [place]. Labs. Cxr.  Albuterol and atrovent neb.  Reviewed nursing notes and prior charts for additional history.   Recheck no wheezing. Breathing comfortable.  No change in neuro exam from prior.  Ct neg acute. Unable to get MR given pacer.   Discussed pt with Dr Kirkpatrick/neurology - he will see/consult.  Likely given very recent complete cva workup will not need much in terms of additional testing. However, given new gait disturbance/falling to right, will need admit, obs, PT.  Med service contacted for admission.        Suzi Roots, MD 01/10/15 [phone]

## 2015-01-10 NOTE — ED Notes (Signed)
Patient unable to give urine sample at this time

## 2015-01-10 NOTE — ED Notes (Addendum)
Pt presents from home with c/o right sided extremity weakness, right sided facial droop, nausea and dizziness that began at 0400 this morning.  Pt reports he spoke with his daughter and went to bed last night around 2230 and he felt fine at this time. Pt is A&Ox4 appears mildly diaphoretic. Pt received 4mg  IV Zofran from EMS

## 2015-01-11 ENCOUNTER — Observation Stay (HOSPITAL_COMMUNITY): Payer: Medicare Other

## 2015-01-11 ENCOUNTER — Encounter (HOSPITAL_COMMUNITY): Payer: Self-pay

## 2015-01-11 ENCOUNTER — Encounter: Payer: Medicare Other | Admitting: Cardiology

## 2015-01-11 DIAGNOSIS — F32A Depression, unspecified: Secondary | ICD-10-CM | POA: Insufficient documentation

## 2015-01-11 DIAGNOSIS — H8110 Benign paroxysmal vertigo, unspecified ear: Secondary | ICD-10-CM | POA: Insufficient documentation

## 2015-01-11 DIAGNOSIS — Z95810 Presence of automatic (implantable) cardiac defibrillator: Secondary | ICD-10-CM

## 2015-01-11 DIAGNOSIS — I639 Cerebral infarction, unspecified: Secondary | ICD-10-CM | POA: Diagnosis not present

## 2015-01-11 DIAGNOSIS — F329 Major depressive disorder, single episode, unspecified: Secondary | ICD-10-CM | POA: Insufficient documentation

## 2015-01-11 MED ORDER — ALBUTEROL SULFATE (2.5 MG/3ML) 0.083% IN NEBU
2.5000 mg | INHALATION_SOLUTION | RESPIRATORY_TRACT | Status: DC | PRN
  Administered 2015-01-12: 2.5 mg via RESPIRATORY_TRACT
  Filled 2015-01-11: qty 3

## 2015-01-11 MED ORDER — CITALOPRAM HYDROBROMIDE 10 MG PO TABS
10.0000 mg | ORAL_TABLET | Freq: Every day | ORAL | Status: DC
  Administered 2015-01-12: 10 mg via ORAL
  Filled 2015-01-11: qty 1

## 2015-01-11 MED ORDER — IPRATROPIUM-ALBUTEROL 0.5-2.5 (3) MG/3ML IN SOLN
3.0000 mL | Freq: Three times a day (TID) | RESPIRATORY_TRACT | Status: DC
  Administered 2015-01-11 – 2015-01-12 (×2): 3 mL via RESPIRATORY_TRACT
  Filled 2015-01-11 (×3): qty 3

## 2015-01-11 NOTE — Evaluation (Signed)
Physical Therapy Evaluation Patient Details Name: [name] [name] MRN: [id number] DOB: Dec 07, 1941 Today's Date: 01/11/2015   History of Present Illness  Pt is a 74 y.o. Male admitted 01/10/15 Rt sided weakness, dizziness, and vomiting. CT was negative for acute abnormality.CT was negative for acute abnormality.CT head negative for acute changes (1/31 and repeat 2/1) PMH: Recent hospital admission (12/23/14) with similar complaints; CAD s/p AICD/Pacemaker, ischemic cardiomyopathy (EF 20-25%), A-fib on Coumadin, HTN, CRI, and AAA.  Clinical Impression  Pt admitted with above diagnosis. Pt currently with functional limitations due to the deficits listed below (see PT Problem List). Pt a high fall risk without use of a device with Rt sided decr strength and coordination. Pt very motivated to improve his balance and safety. Pt will benefit from skilled PT to increase their independence and safety with mobility to allow discharge to the venue listed below.       Follow Up Recommendations CIR;Supervision/Assistance - 24 hour    Equipment Recommendations  Rolling walker with 5" wheels    Recommendations for Other Services OT consult     Precautions / Restrictions Precautions Precautions: Fall Restrictions Weight Bearing Restrictions: No      Mobility  Bed Mobility Overal bed mobility: Modified Independent             General bed mobility comments: incr time and effort  Transfers Overall transfer level: Needs assistance Equipment used: Rolling walker (2 wheeled) Transfers: Sit to/from Stand Sit to Stand: Min guard         General transfer comment: vc for safe technique and safe use of DME; pt favors using LLE more than RLE  Ambulation/Gait Ambulation/Gait assistance: Min assist;Mod assist Ambulation Distance (Feet): 150 Feet Assistive device: None;Rolling walker (2 wheeled) Gait Pattern/deviations: Step-through pattern;Decreased stance time - right;Decreased dorsiflexion -  right;Decreased weight shift to right;Staggering right;Drifts right/left   Gait velocity interpretation: Below normal speed for age/gender General Gait Details: drifts consistently to his Rt, with occasional stagger to his Rt; without device up to mod assist to recover; educated in use of RW with min assist to maintain balance (especially during turning)  Stairs            Wheelchair Mobility    Modified Rankin (Stroke Patients Only) Modified Rankin (Stroke Patients Only) Pre-Morbid Rankin Score: No symptoms Modified Rankin: Moderately severe disability     Balance Overall balance assessment: Needs assistance;History of Falls Sitting-balance support: No upper extremity supported;Feet supported Sitting balance-Leahy Scale: Good     Standing balance support: No upper extremity supported Standing balance-Leahy Scale: Poor Standing balance comment: weight shifted to his Rt slightly                             Pertinent Vitals/Pain Pain Assessment: No/denies pain    Home Living Family/patient expects to be discharged to:: Private residence Living Arrangements: Alone Available Help at Discharge: Family;Available 24 hours/day (multiple family members can arrange 24/7) Type of Home: Mobile home Home Access: Stairs to enter Entrance Stairs-Rails: Can reach both Entrance Stairs-Number of Steps: 2 Home Layout: One level Home Equipment: Walker - 2 wheels;Grab bars - tub/shower;Wheelchair - manual;Cane - single point      Prior Function Level of Independence: Independent         Comments: reports he fell x1 since last admission; missed reaching for a chair as walking (furniture walking due to imbalance; denied dizziness)     Hand Dominance   Dominant  Hand: Right    Extremity/Trunk Assessment   Upper Extremity Assessment: Defer to OT evaluation           Lower Extremity Assessment: RLE deficits/detail RLE Deficits / Details: AROM WFL except lacking  DF (-20; passively full ROM); DF 3-/5   Decr coordination with heel to shin and RAM (toe tapping) Decr proprioception at ankle; intact at knee  Cervical / Trunk Assessment: Normal  Communication   Communication: No difficulties  Cognition Arousal/Alertness: Awake/alert Behavior During Therapy: WFL for tasks assessed/performed Overall Cognitive Status: Within Functional Limits for tasks assessed                      General Comments General comments (skin integrity, edema, etc.): Male and male family members present and agreed with ability to provide 24/7 assist with help of several family members    Exercises        Assessment/Plan    PT Assessment Patient needs continued PT services  PT Diagnosis Difficulty walking;Abnormality of gait;Hemiplegia dominant side   PT Problem List Decreased strength;Decreased balance;Decreased mobility;Decreased coordination;Decreased knowledge of use of DME;Decreased safety awareness;Decreased knowledge of precautions;Impaired sensation  PT Treatment Interventions DME instruction;Gait training;Stair training;Functional mobility training;Therapeutic activities;Balance training;Neuromuscular re-education;Patient/family education   PT Goals (Current goals can be found in the Care Plan section) Acute Rehab PT Goals Patient Stated Goal: wants to be safe on his feet before he goes home Time For Goal Achievement: 01/18/15 Potential to Achieve Goals: Good    Frequency Min 4X/week   Barriers to discharge Decreased caregiver support      Co-evaluation               End of Session Equipment Utilized During Treatment: Gait belt Activity Tolerance: Patient tolerated treatment well Patient left: with family/visitor present;in chair;with call bell/phone within reach;with chair alarm set Nurse Communication: Mobility status    Functional Assessment Tool Used: clinical judgement Functional Limitation: Mobility: Walking and moving  around Mobility: Walking and Moving Around Current Status ([id number]): At least 20 percent but less than 40 percent impaired, limited or restricted Mobility: Walking and Moving Around Goal Status [phone]): At least 1 percent but less than 20 percent impaired, limited or restricted    Time: 1028-1110 PT Time Calculation (min) (ACUTE ONLY): 42 min   Charges:   PT Evaluation $Initial PT Evaluation Tier I: 1 Procedure PT Treatments $Gait Training: 23-37 mins   PT G Codes:   PT G-Codes **NOT FOR INPATIENT CLASS** Functional Assessment Tool Used: clinical judgement Functional Limitation: Mobility: Walking and moving around Mobility: Walking and Moving Around Current Status (O1308): At least 20 percent but less than 40 percent impaired, limited or restricted Mobility: Walking and Moving Around Goal Status [phone]): At least 1 percent but less than 20 percent impaired, limited or restricted    Gerri Acre 01/11/2015, 11:32 AM Pager [phone]

## 2015-01-11 NOTE — Progress Notes (Signed)
PROGRESS NOTE  [name] [name] ZOX:[id number] DOB: 1941-01-29 DOA: 01/10/2015 PCP: [name], MD  HPI: 74 y.o. male with a Past Medical History of history of CVA, history of A. fib on anticoagulation, history of chronic systolic heart failure, history of CAD, history of AICD/pacemaker implantation, history of AAA under surveillance who presented 1/31 with right sided weakness.   Subjective / 24 H Interval events No complaints this morning, continues to be weak Did well following discharge and was at baseline for the past 2 weeks until this happened Reports compliance with his anticoagulation  Assessment/Plan: Principal Problem:   CVA (cerebral infarction) Active Problems:   HYPERCHOLESTEROLEMIA, PURE   HYPERTENSION, BENIGN   Cardiomyopathy, ischemic   COPD (chronic obstructive pulmonary disease)   Chronic systolic heart failure   Automatic implantable cardioverter-defibrillator in situ   Acute CVA - recent workup, will not repeat - continue Pradaxa - Neuro following - repeat CT scan without changes  HTN - continue home medications  Cardiomyopathy, ischemic  Atrial fibrillation - Continue Amiodarone, Metoprolol - continue Pradaxa  Abdominal aortic aneurysm - monitored as an outpatient  Chronic systolic heart failure - euvolemic  Atomatic implantable cardioverter-defibrillator in situ  Renal insufficiency - monitor, Cr at baseline  COPD - stable, no wheezing   Diet: Diet Heart Fluids: none  DVT Prophylaxis: Pradaxa  Code Status: Full Code Family Communication: d/w daughter bedside  Disposition Plan: dispo pending  Consultants:  Neurology   Procedures:  None    Antibiotics None    Studies  Dg Chest 2 View  01/10/2015   CLINICAL DATA:  Patient states that since last night he has been having right sided numbness with pains and dizziness, unable to stand for exam due to weakness  EXAM: CHEST  2 VIEW  COMPARISON:  12/16/2014  FINDINGS: Changes  from cardiac surgery are stable. Cardiac silhouette is mildly enlarged. No mediastinal or hilar masses or convincing adenopathy.  Lungs are mildly hyperexpanded. No lung consolidation or edema. No pleural effusion or pneumothorax.  Left anterior chest wall biventricular cardioverter-defibrillator appear stable  Bony thorax is demineralized but grossly intact.  IMPRESSION: No acute cardiopulmonary disease.   Electronically Signed   By: Amie Portland M.D.   On: 01/10/2015 12:08   Ct Head Wo Contrast  01/11/2015   CLINICAL DATA:  Right-sided weakness.  Prior CVA.  EXAM: CT HEAD WITHOUT CONTRAST  TECHNIQUE: Contiguous axial images were obtained from the base of the skull through the vertex without contrast.  COMPARISON:  01/10/2015  FINDINGS: Stable patchy low-density throughout the white matter, particularly in the parietal subcortical white matter. No evidence for acute hemorrhage, mass lesion, midline shift, hydrocephalus or large infarct. There is extensive mucosal disease throughout the paranasal sinuses. This is most prominent in the left maxillary sinus and left frontal sinus. Large polyp or retention cyst in the anterior right sphenoid sinus. No acute bone abnormality.  IMPRESSION: No acute intracranial abnormality.  White matter changes likely represent chronic small vessel ischemic disease.  Diffuse paranasal sinus disease.   Electronically Signed   By: Richarda Overlie M.D.   On: 01/11/2015 07:59   Ct Head Wo Contrast  01/10/2015   CLINICAL DATA:  Right sided extremity weakness, Right facial droop, and dizziness upon waking this morning at 0400am. Pt states he felt fine when he went to bed last night Hx: A-fib, CAD, HTN, CHF, MI, CVA  EXAM: CT HEAD WITHOUT CONTRAST  TECHNIQUE: Contiguous axial images were obtained from the base of the skull  through the vertex without intravenous contrast.  COMPARISON:  12/24/2014  FINDINGS: Ventricles are normal in configuration. There is ventricular and sulcal enlargement  reflecting age related volume loss. No hydrocephalus.  No parenchymal masses or mass effect.  There is no evidence of a cortical infarct.  Patchy areas of white matter hypoattenuation noted consistent with mild chronic microvascular ischemic change.  There are no extra-axial masses or abnormal fluid collections.  There is no intracranial hemorrhage.  There is sinus disease. Left frontal sinus is opacified. There is mucosal thickening with some dependent fluid in multiple ethmoid air cells. Dependent fluid and mucosal thickening is noted in the left maxillary sinus. There is polypoid mucosal thickening in the right maxillary sinus and a mucous retention cyst in the right sphenoid sinus.  Clear mastoid air cells.  IMPRESSION: 1. No acute intracranial abnormalities. 2. Age related volume loss and mild chronic microvascular ischemic change. 3. Significant sinus disease as detailed above.   Electronically Signed   By: Amie Portland M.D.   On: 01/10/2015 10:27    Objective  Filed Vitals:   01/11/15 0124 01/11/15 0330 01/11/15 0538 01/11/15 1039  BP: 133/61 141/70 139/71 158/68  Pulse: 72 59 60 65  Temp: 98 F (36.7 C) 98.2 F (36.8 C) 98.1 F (36.7 C) 98.4 F (36.9 C)  TempSrc:    Oral  Resp: 18 18 16 20   Height:      Weight:      SpO2: 96% 95% 95% 94%   No intake or output data in the 24 hours ending 01/11/15 1138 Filed Weights   01/10/15 2325  Weight: 110 kg (242 lb 8.1 oz)   Exam:  General:  NAD  HEENT: no scleral icterus  Cardiovascular: RRR  Respiratory: CTA biL  Abdomen: soft, non tender  MSK/Extremities: no clubbing/cyanosis  Skin: no rashes  Neuro: 4/5 on right, 5/5 on left  Data Reviewed: Basic Metabolic Panel:  Recent Labs Lab 01/10/15 1010 01/10/15 1047  NA 145 146*  K 3.8 3.7  CL 111 107  CO2 25  --   GLUCOSE 104* 97  BUN 12 12  CREATININE 1.03 1.00  CALCIUM 8.7  --    Liver Function Tests:  Recent Labs Lab 01/10/15 1010  AST 18  ALT 13  ALKPHOS  86  BILITOT 0.9  PROT 6.0  ALBUMIN 3.5   CBC:  Recent Labs Lab 01/10/15 1010 01/10/15 1047  WBC 6.0  --   NEUTROABS 3.7  --   HGB 14.8 16.3  HCT 45.6 48.0  MCV 92.7  --   PLT 149*  --    CBG:  Recent Labs Lab 01/10/15 0940  GLUCAP 94   Scheduled Meds: .  stroke: mapping our early stages of recovery book   Does not apply Once  . amiodarone  200 mg Oral Daily  . amLODipine  10 mg Oral Daily  . dabigatran  150 mg Oral BID  . hydrALAZINE  25 mg Oral TID  . lisinopril  40 mg Oral Daily  . metoprolol succinate  12.5 mg Oral Daily  . multivitamin with minerals  1 tablet Oral Daily  . niacin  1,000 mg Oral QHS  . potassium chloride SA  20 mEq Oral Daily  . pravastatin  80 mg Oral Daily  . sodium chloride  3 mL Intravenous Q12H   Continuous Infusions:   Pamella Pert, MD [facility] Pager [phone]. If 7 PM - 7 AM, please contact night-coverage at [email or web address], password [facility] 01/11/2015,  11:38 AM  LOS: 1 day

## 2015-01-11 NOTE — Progress Notes (Signed)
Physical medicine and rehabilitation consult requested chart reviewed. Patient is currently under observation status with workup thus far for CVA being unremarkable. Patient currently does not meet medical necessity for inpatient rehabilitation services. Will await further imaging and recommendations by neurology. Please reconsult if needed

## 2015-01-11 NOTE — Discharge Instructions (Addendum)
Information on my medicine - Pradaxa (dabigatran)  This medication education was reviewed with me or my healthcare representative as part of my discharge preparation.  The pharmacist that spoke with me during my hospital stay was:  Herby Abraham, [facility]  Why was Pradaxa prescribed for you? Pradaxa was prescribed for you to reduce the risk of forming blood clots that cause a stroke if you have a medical condition called atrial fibrillation (a type of irregular heartbeat).    What do you Need to know about PradAXa? Take your Pradaxa TWICE DAILY - one capsule in the morning and one tablet in the evening with or without food.  It would be best to take the doses about the same time each day.  The capsules should not be broken, chewed or opened - they must be swallowed whole.  Do not store Pradaxa in other medication containers - once the bottle is opened the Pradaxa should be used within FOUR months; throw away any capsules that havent been by that time.  Take Pradaxa exactly as prescribed by your doctor.  DO NOT stop taking Pradaxa without talking to the doctor who prescribed the medication.  Stopping without other stroke prevention medication to take the place of Pradaxa may increase your risk of developing a clot that causes a stroke.  Refill your prescription before you run out.  After discharge, you should have regular check-up appointments with your healthcare provider that is prescribing your Pradaxa.  In the future your dose may need to be changed if your kidney function or weight changes by a significant amount.  What do you do if you miss a dose? If you miss a dose, take it as soon as you remember on the same day.  If your next dose is less than 6 hours away, skip the missed dose.  Do not take two doses of PRADAXA at the same time.  Important Safety Information A possible side effect of Pradaxa is bleeding. You should call your healthcare provider right away if you experience any  of the following: ? Bleeding from an injury or your nose that does not stop. ? Unusual colored urine (red or dark brown) or unusual colored stools (red or black). ? Unusual bruising for unknown reasons. ? A serious fall or if you hit your head (even if there is no bleeding).  Some medicines may interact with Pradaxa and might increase your risk of bleeding or clotting while on Pradaxa. To help avoid this, consult your healthcare provider or pharmacist prior to using any new prescription or non-prescription medications, including herbals, vitamins, non-steroidal anti-inflammatory drugs (NSAIDs) and supplements.  This website has more information on Pradaxa (dabigatran): [email or web address].com   Follow with [name], MD in 5-7 days  Please get a complete blood count and chemistry panel checked by your Primary MD at your next visit, and again as instructed by your Primary MD. Please get your medications reviewed and adjusted by your Primary MD.  Please request your Primary MD to go over all Hospital Tests and Procedure/Radiological results at the follow up, please get all Hospital records sent to your Prim MD by signing hospital release before you go home.  If you had Pneumonia of Lung problems at the Hospital: Please get a 2 view Chest X ray done in 6-8 weeks after hospital discharge or sooner if instructed by your Primary MD.  If you have Congestive Heart Failure: Please call your Cardiologist or Primary MD anytime you have any of the following symptoms:  1) 3 pound weight gain in 24 hours or 5 pounds in 1 week  2) shortness of breath, with or without a dry hacking cough  3) swelling in the hands, feet or stomach  4) if you have to sleep on extra pillows at night in order to breathe  Follow cardiac low salt diet and 1.5 lit/day fluid restriction.  If you have diabetes Accuchecks 4 times/day, Once in AM empty stomach and then before each meal. Log in all results and show them to  your primary doctor at your next visit. If any glucose reading is under 80 or above 300 call your primary MD immediately.  If you have Seizure/Convulsions/Epilepsy: Please do not drive, operate heavy machinery, participate in activities at heights or participate in high speed sports until you have seen by Primary MD or a Neurologist and advised to do so again.  If you had Gastrointestinal Bleeding: Please ask your Primary MD to check a complete blood count within one week of discharge or at your next visit. Your endoscopic/colonoscopic biopsies that are pending at the time of discharge, will also need to followed by your Primary MD.  Get Medicines reviewed and adjusted. Please take all your medications with you for your next visit with your Primary MD  Please request your Primary MD to go over all hospital tests and procedure/radiological results at the follow up, please ask your Primary MD to get all Hospital records sent to his/her office.  If you experience worsening of your admission symptoms, develop shortness of breath, life threatening emergency, suicidal or homicidal thoughts you must seek medical attention immediately by calling 911 or calling your MD immediately  if symptoms less severe.  You must read complete instructions/literature along with all the possible adverse reactions/side effects for all the Medicines you take and that have been prescribed to you. Take any new Medicines after you have completely understood and accpet all the possible adverse reactions/side effects.   Do not drive or operate heavy machinery when taking Pain medications.   Do not take more than prescribed Pain, Sleep and Anxiety Medications  Special Instructions: If you have smoked or chewed Tobacco  in the last 2 yrs please stop smoking, stop any regular Alcohol  and or any Recreational drug use.  Wear Seat belts while driving.  Please note You were cared for by a hospitalist during your hospital  stay. If you have any questions about your discharge medications or the care you received while you were in the hospital after you are discharged, you can call the unit and asked to speak with the hospitalist on call if the hospitalist that took care of you is not available. Once you are discharged, your primary care physician will handle any further medical issues. Please note that NO REFILLS for any discharge medications will be authorized once you are discharged, as it is imperative that you return to your primary care physician (or establish a relationship with a primary care physician if you do not have one) for your aftercare needs so that they can reassess your need for medications and monitor your lab values.  You can reach the hospitalist office at phone [phone] or fax [phone]   If you do not have a primary care physician, you can call [phone] for a physician referral.  Activity: As tolerated with Full fall precautions use walker/cane & assistance as needed  Diet: heart healthy  Disposition Home

## 2015-01-11 NOTE — Progress Notes (Addendum)
STROKE TEAM PROGRESS NOTE   HISTORY [name] [name] is a 74 y.o. male previously admitted to [facility] from 12/24/2014 to 12/25/2014 for evaluation of dizziness and right hemiparesis with hemisensory loss. The patient has an implantable defibrillator so he could not have an MRI. Head CT, as well as CT angiogram of the head and neck did not reveal any significant findings. Other history for this patient includes atrial fibrillation on Pradaxa, coronary artery disease, previous left carotid endarterectomy, AAA, COPD, hyperlipidemia, hypertension, ischemic cardiomyopathy, and previous stroke. He is on Pradaxa for atrial fibrillation and denies missing any doses. Prior to his previous stroke he was on warfarin.  Today 01/10/2015 the patient was readmitted to [facility] for evaluation of right hemiparesis noted after awakening this morning. A CT of the head today did not show any acute abnormalities.He is on Pradaxa for atrial fibrillation and denies missing any doses. Prior to his previous stroke he was on warfarin.  The patient reports that he woke up at 4 AM and did not feel well. He went to the bathroom and vomited, felt unsteady on his feet, so he went back to bed. He got up again at 7 AM and vomited again. He checked his blood pressure - systolic was 225. He does not remember diastolic. He noted unsteadiness and right-sided weakness. He took his a.m. meds. His blood pressure remained elevated. He noted numbness and tingling on the right side. He was brought to [facility] emergency department as a code stroke and admitted. He has an appointment to see his cardiologist Dr. [name] tomorrow morning. I sent a page to the physician assistant informing them of the patient's admission.  He was last known well Jan 09, 2015, Unable to determine time.  Patient was not administered TPA secondary to delay in arrival. He was admitted for further evaluation and treatment.   SUBJECTIVE  (INTERVAL HISTORY) His wife died in 08-26-2015. His family is at the bedside.  PA from IP rehab also present.   Pt had TIA x 2 in 01/2013 for dizziness and left sided numbness. On coumadin and INR 2.1, changed to pradaxa. However, pradaxa changed back to coumadin due to cost. 12/24/14 he was here again for dizziness and right sided numbness, stroke work up negative. INR 1.95. This time, he was admitted again due to dizziness and right numbness and vomited x 2, unsteady gait, BP was high during episodes. CTA head and neck anterior athero, no LVO, CT repeat no stroke. LDL 75 and A1C 5.5. BP after admission in good control. He was inconsistent about dizziness, vertigo vs. Lightheadedness.  Family report pt goes to the [employer] daily for grief of her wife. Overall he feels his condition is stable for now. He does not deny depression - when asked about treatment, he says "you are the doctor, i am here to get better". Patient went to primary MD on Friday who recommended rehab. Pt refused.   OBJECTIVE Temp:  [98 F (36.7 C)-98.3 F (36.8 C)] 98.1 F (36.7 C) (02/01 0538) Pulse Rate:  [59-72] 60 (02/01 0538) Cardiac Rhythm:  [-] A-V Sequential paced (01/31 2000) Resp:  [12-20] 16 (02/01 0538) BP: (133-156)/(61-72) 139/71 mmHg (02/01 0538) SpO2:  [93 %-100 %] 95 % (02/01 0538) Weight:  [110 kg (242 lb 8.1 oz)] 110 kg (242 lb 8.1 oz) (01/31 2325)   Recent Labs Lab 01/10/15 0940  GLUCAP 94    Recent Labs Lab 01/10/15 1010 01/10/15 1047  NA 145 146*  K 3.8 3.7  CL 111 107  CO2 25  --   GLUCOSE 104* 97  BUN 12 12  CREATININE 1.03 1.00  CALCIUM 8.7  --     Recent Labs Lab 01/10/15 1010  AST 18  ALT 13  ALKPHOS 86  BILITOT 0.9  PROT 6.0  ALBUMIN 3.5    Recent Labs Lab 01/10/15 1010 01/10/15 1047  WBC 6.0  --   NEUTROABS 3.7  --   HGB 14.8 16.3  HCT 45.6 48.0  MCV 92.7  --   PLT 149*  --    No results for input(s): CKTOTAL, CKMB, CKMBINDEX, TROPONINI in the last 168  hours.  Recent Labs  01/10/15 1010  LABPROT 16.5*  INR 1.32    Recent Labs  01/10/15 1354  COLORURINE YELLOW  LABSPEC 1.019  PHURINE 7.0  GLUCOSEU NEGATIVE  HGBUR NEGATIVE  BILIRUBINUR NEGATIVE  KETONESUR NEGATIVE  PROTEINUR 100*  UROBILINOGEN 1.0  NITRITE NEGATIVE  LEUKOCYTESUR NEGATIVE       Component Value Date/Time   CHOL 144 12/24/2014 0333   TRIG 154* 12/24/2014 0333   HDL 38* 12/24/2014 0333   CHOLHDL 3.8 12/24/2014 0333   VLDL 31 12/24/2014 0333   LDLCALC 75 12/24/2014 0333   Lab Results  Component Value Date   HGBA1C 5.5 12/24/2014      Component Value Date/Time   LABOPIA NONE DETECTED 01/14/2013 0417   COCAINSCRNUR NONE DETECTED 01/14/2013 0417   LABBENZ NONE DETECTED 01/14/2013 0417   AMPHETMU NONE DETECTED 01/14/2013 0417   THCU NONE DETECTED 01/14/2013 0417   LABBARB NONE DETECTED 01/14/2013 0417    No results for input(s): ETH in the last 168 hours.  Dg Chest 2 View 01/10/2015    No acute cardiopulmonary disease.     Ct Head Wo Contrast 01/11/2015    No acute intracranial abnormality.  White matter changes likely represent chronic small vessel ischemic disease.  Diffuse paranasal sinus disease.    01/10/2015    1. No acute intracranial abnormalities. 2. Age related volume loss and mild chronic microvascular ischemic change. 3. Significant sinus disease  CTA NECK  12/25/2014  1. No hemodynamically significant stenosis or vascular occlusion identified within the neck.  2. Moderate calcified plaque at the right carotid bifurcation and proximal right ICA with associated mild stenosis of 20-30% by NASCET criteria.  3. Sequelae of prior left carotid endarterectomy. The left ICA is widely patent.   CTA BRAIN 12/25/2014  1. No proximal branch occlusion identified within the intracranial circulation.  2. Moderate calcified atheromatous disease within the cavernous ICAs with associated stenosis of up to 50% by NASCET criteria.  3. Mild  atherosclerotic plaque within the distal vertebral arteries bilaterally without significant stenosis.   Carotid Doppler Done Nov 2015 - R ICA < 40%, L ICA patent s/p hx CEA  2-D echocardiogram  12/25/2014 - Left ventricle: The cavity size was severely dilated. Wallthickness was increased in a pattern of mild LVH. Systolicfunction was mildly to moderately reduced. The estimated ejectionfraction was in the range of 40% to 45%. Diffuse hypokinesis.There is akinesis of the inferior myocardium. Doppler parametersare consistent with abnormal left ventricular relaxation (grade 1diastolic dysfunction). - Left atrium: The atrium was moderately dilated. Impressions: - Inferior akinesis with mild to moderately reduced LV function;grade 1 diastolic dysfunction; if clinically indicated, TEE wouldhave better sensitivity for source of embolus.   PHYSICAL EXAM  Temp:  [97.7 F (36.5 C)-98.6 F (37 C)] 98.1 F (36.7 C) (02/01  2122) Pulse Rate:  [59-72] 72 (02/01 2122) Resp:  [16-20] 20 (02/01 2122) BP: (132-158)/(61-76) 143/64 mmHg (02/01 2122) SpO2:  [93 %-96 %] 96 % (02/01 2122) Weight:  [242 lb 8.1 oz (110 kg)] 242 lb 8.1 oz (110 kg) (01/31 2325)  General - Well nourished, well developed, in no apparent distress.  Ophthalmologic - fundi not visualized due to eye movement.  Cardiovascular - Regular rate and rhythm with no murmur.  Mental Status -  Level of arousal and orientation to time, place, and person were intact. Language including expression, naming, repetition, comprehension was assessed and found intact.  Cranial Nerves II - XII - II - Visual field intact OU. III, IV, VI - Extraocular movements intact. V - Facial sensation subjectively decreased on the right but he also felt decreased frontal vibration on the left. VII - Facial movement intact bilaterally. VIII - Hearing & vestibular intact bilaterally. X - Palate elevates symmetrically. XI - Chin turning & shoulder shrug  intact bilaterally. XII - Tongue protrusion intact.  Motor Strength - The patient's strength testing showed significant right side give-away weakness but pronator drift was absent.  Bulk was normal and fasciculations were absent.   Motor Tone - Muscle tone was assessed at the neck and appendages and was normal.  Reflexes - The patient's reflexes were normal in all extremities and he had no pathological reflexes.  Sensory - Light touch, temperature/pinprick subjective decreased on the right were assessed and were normal.    Coordination - The patient had normal movements in right with no ataxia or dysmetria.  Tremor was present on the left hand but consistent with physiological tremor.  Gait and Station - unsteady on walking but able to hold by himself without assistance.  Dix-Hallpike testing -  No nystagmus seen but pt report reproducible for his episodes.  ASSESSMENT/PLAN Mr. LAMORRIS KNOBLOCK is a 74 y.o. male with history of atrial fibrillation on Pradaxa, coronary artery disease, prior left CEA, AAA, COPD, hyperlipidemia, hypertension, ischemic cardiomyopathy and previous stroke  presenting with dizziness and right hemiparesis with hemisensory loss, dizziness and vomiting. He did not receive IV t-PA due to delay in arrival.   ? BPPV - No acute stroke.  Resultant  Right arm tingling/numbness/weakness - subjective and give away weakness  Gilberto Better test ? positive  Initial CT and repeat CT without stroke  Carotid Doppler  No significant stenosis   2D Echo  No source of embolus   pradaxafor VTE prophylaxis  Diet Heart thin liquids  pradaxa (dabigatran) prior to admission, now on pradaxa (dabigatran)  Stress and depression likely associated with neuro symptoms. Recommend depression treatment.  Therapy recommendations:  CIR  Recommend BPPV maneuver by PT/OT  Recommend vestibular evaluation by PT  Disposition:  CIR consult pending (pt currently obs status and precludes  IP consult)  Depression  Prolonged grief period  Clinically depression  Recommend depression treatment as per primary team  Started on celexa  No SI or HI  Atrial Fibrillation  Home meds:  pradaxa, continued in the hospital  Rate controlled - on metoprolol  Rhythm control - on amiodarone   Hypertension  Stable  Home meds - lasix, hydralazine  Resumed on admission  Close monitoring  Hyperlipidemia  Home meds:  Pravachol 80, resumed in hospital  LDL 75, goal < 70  Continue statin at discharge  Other Stroke Risk Factors  Advanced age  Former Cigarette smoker, quit smoking 10 years ago  Obesity, Body mass index is 32 kg/(m^2).  Hx stroke/TIA  Family hx stroke (maternal uncle)  History left CEA (Dr. [name])  Ischemic cardiomyopathy  Coronary artery disease Status post CABG, MI  Other Active Problems  Iliac artery aneurysm followed by Dr. [name]  Ischemic cardiomyopathy  Chronic systolic heart failure  Renal insufficiency  Hx COPD  Biventricular defibrillator (Dr. [name])  Other Pertinent History  Back surgery with resultant loss on sensation R leg  Hospital day # 1  Rhoderick Moody [facility] See Amion for Pager information 01/11/2015 12:03 PM   I, the attending vascular neurologist, have personally obtained a history, examined the patient, evaluated laboratory data, individually viewed imaging studies and agree with radiology interpretations. I also obtained additional history from pt's daughter and son at bedside. I also discussed with Dr. Wyonia Hough regarding his care plan. Together with the NP/PA, we formulated the assessment and plan of care which reflects our mutual decision.  I have made any additions or clarifications directly to the above note and agree with the findings and plan as currently documented.   74 yo M with multiple stroke risk factors admitted for recurrent episode of dizziness and right side numbness with  vomiting. Acute stroke work up negative. On pradaxa for afib. Exam showed functional component. Pt has a long time stress and depression due to loss of wife in 07/2014 and significant depression needing treatment. Started on celexa. Exam also showed possible BPPV, recommend PT education for BPPV maneuver training.   Marvel Plan, MD PhD Stroke Neurology 01/11/2015 10:40 PM       To contact Stroke Continuity provider, please refer to [email or web address]. After hours, contact General Neurology

## 2015-01-12 DIAGNOSIS — I639 Cerebral infarction, unspecified: Secondary | ICD-10-CM | POA: Diagnosis not present

## 2015-01-12 DIAGNOSIS — H8113 Benign paroxysmal vertigo, bilateral: Secondary | ICD-10-CM

## 2015-01-12 MED ORDER — CITALOPRAM HYDROBROMIDE 10 MG PO TABS: 10.0000 mg | ORAL_TABLET | Freq: Every day | ORAL | Status: DC

## 2015-01-12 NOTE — Evaluation (Signed)
Speech Language Pathology Evaluation Patient Details Name: [name] [name] MRN: [id number] DOB: 14-Mar-1941 Today's Date: 01/12/2015 Time: 5852-7782 SLP Time Calculation (min) (ACUTE ONLY): 15 min  Problem List:  Patient Active Problem List   Diagnosis Date Noted  . BPPV (benign paroxysmal positional vertigo)   . Depression   . CVA (cerebral infarction) 12/24/2014  . Renal insufficiency 12/24/2014  . Stroke 12/23/2014  . Cerebral embolism with cerebral infarction 01/13/2013  . Automatic implantable cardioverter-defibrillator in situ 08/26/2012  . Chronic systolic heart failure 06/28/2012  . Dizziness 06/24/2012  . Claudication 04/15/2012  . Dyspnea 01/26/2012  . Abdominal aortic aneurysm 11/30/2011  . COPD (chronic obstructive pulmonary disease) 07/24/2011  . Long term current use of anticoagulant 02/21/2011  . CORONARY ATHEROSCLEROSIS NATIVE CORONARY ARTERY 10/12/2009  . ILIAC ARTERY ANEURYSM 10/12/2009  . COMPUTERIZED TOMOGRAPHY, CHEST, ABNORMAL 10/12/2009  . HYPERCHOLESTEROLEMIA, PURE 10/18/2008  . HYPERTENSION, BENIGN 10/18/2008  . Cardiomyopathy, ischemic 10/18/2008  . MITRAL STENOSIS/ INSUFFICIENCY, NON-RHEUMATIC 10/18/2008  . Atrial fibrillation 10/18/2008  . CAROTID ARTERY STENOSIS, WITHOUT INFARCTION 10/18/2008   Past Medical History:  Past Medical History  Diagnosis Date  . Atrial fibrillation     s/p prior DCCV;  Amiodarone Rx.  . CAD (coronary artery disease)     s/p CABG;  cath 10/07:  LM ok, LAD occluded, AV CFX 95%, pRCA occluded; L-LAD, S-OM2/OM3, S-PDA ok  . Cerebrovascular disease     s/p prior Left CEA;  followed by Dr. [name]  . COPD (chronic obstructive pulmonary disease)   . Hyperlipidemia   . Hypertension   . Mitral insufficiency     hx of. not noted on echo 01/2012  . Nephrolithiasis   . Ischemic cardiomyopathy     echo 5/09: EF 55%, mild LAE;  Myoview 11/10: inf and apical scar with mild peri-infarct ischemia, EF 44%. Echo 01/26/12 with EF  20-25% but previously known to be 30% on echoes before last one  . Iliac artery aneurysm, left     followed by Dr. [name]  . Aortic dissection     H/O focal aortic dissection  . LBBB (left bundle branch block)   . CHF (congestive heart failure)   . Myocardial infarction   . Renal insufficiency     01/2012  . Stroke    Past Surgical History:  Past Surgical History  Procedure Laterality Date  . Uteroscopy    . Coronary artery bypass graft  1998  . Left cea  07/05  . Appendectomy  09/11/11  . Carotid endarterectomy  2005    left  . Biventricular defibrillator implantation  08/22/12    SJM Quadra Assura BiV ICD implanted by Dr [name]  . Spine surgery  Sep 27, 2014    Lower Back L 4  and   L 5  . Bi-ventricular implantable cardioverter defibrillator N/A 08/22/2012    Procedure: BI-VENTRICULAR IMPLANTABLE CARDIOVERTER DEFIBRILLATOR  (CRT-D);  Surgeon: [name], MD;  Location: [facility] CATH LAB;  Service: Cardiovascular;  Laterality: N/A;   HPI:  74 y.o. male with a Past Medical History of history of CVA, history of A. fib, chronic systolic heart failure, CAD,  AICD/pacemaker implantation, AAA admitted with right sided weakness. Just discharged 12/25/14 for right-sided weakness. CT no acute intracranial abnormality.   Assessment / Plan / Recommendation Clinical Impression  Pt.'s cognitive abilites are functional for return to home. He reports he notifies daughter of bills and she pays online. Pt described a functional system for taking daily meds. Required 2/4 cues on  4 word recall indicating min difficulty with retrieval of information. Pt states uses calendar and phone call for prospective memory. Encouraged him to continue to write important information in a visible location. No further ST recommended.    SLP Assessment  Patient does not need any further Speech Lanaguage Pathology Services    Follow Up Recommendations  None    Frequency and Duration        Pertinent Vitals/Pain Pain  Assessment: No/denies pain   SLP Goals     SLP Evaluation Prior Functioning  Cognitive/Linguistic Baseline: Within functional limits Type of Home: Mobile home  Lives With: Alone Available Help at Discharge: Family;Available 24 hours/day Vocation: Retired   IT consultant  Overall Cognitive Status: Within Functional Limits for tasks assessed Arousal/Alertness: Awake/alert Orientation Level: Oriented X4 Attention: Sustained Sustained Attention: Appears intact Memory:  (needed cues to recall 2 words from 4 word recall) Awareness: Appears intact Problem Solving: Appears intact Safety/Judgment:  (OT reports mild difficulties)    Comprehension  Auditory Comprehension Overall Auditory Comprehension: Appears within functional limits for tasks assessed Visual Recognition/Discrimination Discrimination: Not tested Reading Comprehension Reading Status: Within funtional limits    Expression Expression Primary Mode of Expression: Verbal Verbal Expression Overall Verbal Expression: Appears within functional limits for tasks assessed Written Expression Dominant Hand: Right Written Expression: Within Functional Limits   Oral / Motor Oral Motor/Sensory Function Overall Oral Motor/Sensory Function: Impaired Labial ROM: Reduced right Labial Symmetry: Abnormal symmetry right Labial Strength: Within Functional Limits Labial Sensation: Reduced Lingual ROM: Within Functional Limits Lingual Symmetry: Within Functional Limits Lingual Strength: Within Functional Limits Facial ROM: Within Functional Limits Facial Symmetry: Within Functional Limits Facial Strength: Within Functional Limits Facial Sensation: Reduced Mandible: Within Functional Limits Motor Speech Overall Motor Speech: Appears within functional limits for tasks assessed Motor Planning: Witnin functional limits   GO Functional Assessment Tool Used: skilled clinical judgement Functional Limitations: Memory Memory Current Status  (Z6109): At least 20 percent but less than 40 percent impaired, limited or restricted Memory Goal Status (U0454): At least 20 percent but less than 40 percent impaired, limited or restricted Memory Discharge Status [phone]): At least 20 percent but less than 40 percent impaired, limited or restricted   Royce Macadamia 01/12/2015, 9:11 AM  Breck Coons Lonell Face.Ed [employer] [phone]

## 2015-01-12 NOTE — Progress Notes (Signed)
UR completed 

## 2015-01-12 NOTE — Progress Notes (Signed)
Discharge instructions and prescription given to pt. Pt verbally acknowledged understanding. Pt to be transported home per private car by family member. Pt has both upper and lower dentures in place in mouth.  Pt to be transported to lobby per wheelchair by nurse tech. Will monitor   [name] [name] I 01/12/2015 3:57 PM

## 2015-01-12 NOTE — Progress Notes (Deleted)
PROGRESS NOTE  [name] [name]:[id number] DOB: 1941-07-22 DOA: 01/10/2015 PCP: [name], MD  HPI: 74 y.o. male with a Past Medical History of history of CVA, history of A. fib on anticoagulation, history of chronic systolic heart failure, history of CAD, history of AICD/pacemaker implantation, history of AAA under surveillance who presented 1/31 with right sided weakness.   Subjective / 24 H Interval events Weakness better  Assessment/Plan: Principal Problem:   CVA (cerebral infarction) Active Problems:   HYPERCHOLESTEROLEMIA, PURE   HYPERTENSION, BENIGN   Cardiomyopathy, ischemic   COPD (chronic obstructive pulmonary disease)   Chronic systolic heart failure   Automatic implantable cardioverter-defibrillator in situ   BPPV (benign paroxysmal positional vertigo)   Depression   Acute CVA / right sided weakness / dizziness - recent workup, will not repeat - continue Pradaxa - Neuro following, per neuro exam not consistent with CVA, component of vertigo, some of the symptoms related to stress and depression - PT vestibular evaluation pending  Depression - patient's wife passed last August, endorses depression - agree with Celexa as started per Neurology  HTN - continue home medications  Cardiomyopathy, ischemic  Atrial fibrillation - Continue Amiodarone, Metoprolol - continue Pradaxa  Abdominal aortic aneurysm - monitored as an outpatient  Chronic systolic heart failure - euvolemic  Atomatic implantable cardioverter-defibrillator in situ  Renal insufficiency - monitor, Cr at baseline  COPD - stable, no wheezing   Diet: Diet Heart Fluids: none  DVT Prophylaxis: Pradaxa  Code Status: Full Code Family Communication: d/w daughter bedside  Disposition Plan: home tomorrow  Consultants:  Neurology   Procedures:  None    Antibiotics None    Studies  Ct Head Wo Contrast  01/11/2015   CLINICAL DATA:  Right-sided weakness.  Prior CVA.  EXAM: CT  HEAD WITHOUT CONTRAST  TECHNIQUE: Contiguous axial images were obtained from the base of the skull through the vertex without contrast.  COMPARISON:  01/10/2015  FINDINGS: Stable patchy low-density throughout the white matter, particularly in the parietal subcortical white matter. No evidence for acute hemorrhage, mass lesion, midline shift, hydrocephalus or large infarct. There is extensive mucosal disease throughout the paranasal sinuses. This is most prominent in the left maxillary sinus and left frontal sinus. Large polyp or retention cyst in the anterior right sphenoid sinus. No acute bone abnormality.  IMPRESSION: No acute intracranial abnormality.  White matter changes likely represent chronic small vessel ischemic disease.  Diffuse paranasal sinus disease.   Electronically Signed   By: Richarda Overlie M.D.   On: 01/11/2015 07:59    Objective  Filed Vitals:   01/12/15 0544 01/12/15 0948 01/12/15 0951 01/12/15 1258  BP: 128/46 151/69  152/62  Pulse: 63 73  62  Temp: 98.4 F (36.9 C) 97.8 F (36.6 C)  97.7 F (36.5 C)  TempSrc: Oral Oral  Oral  Resp: 20 20  20   Height:      Weight:      SpO2: 93% 95% 96% 94%    Intake/Output Summary (Last 24 hours) at 01/12/15 1323 Last data filed at 01/12/15 1316  Gross per 24 hour  Intake      3 ml  Output    825 ml  Net   -822 ml   Filed Weights   01/10/15 2325  Weight: 110 kg (242 lb 8.1 oz)   Exam:  General:  NAD  HEENT: no scleral icterus  Cardiovascular: RRR  Respiratory: CTA biL  Abdomen: soft, non tender  MSK/Extremities: no clubbing/cyanosis  Skin:  no rashes  Neuro: 4/5 on right, 5/5 on left  Data Reviewed: Basic Metabolic Panel:  Recent Labs Lab 01/10/15 1010 01/10/15 1047  NA 145 146*  K 3.8 3.7  CL 111 107  CO2 25  --   GLUCOSE 104* 97  BUN 12 12  CREATININE 1.03 1.00  CALCIUM 8.7  --    Liver Function Tests:  Recent Labs Lab 01/10/15 1010  AST 18  ALT 13  ALKPHOS 86  BILITOT 0.9  PROT 6.0    ALBUMIN 3.5   CBC:  Recent Labs Lab 01/10/15 1010 01/10/15 1047  WBC 6.0  --   NEUTROABS 3.7  --   HGB 14.8 16.3  HCT 45.6 48.0  MCV 92.7  --   PLT 149*  --    CBG:  Recent Labs Lab 01/10/15 0940  GLUCAP 94   Scheduled Meds: .  stroke: mapping our early stages of recovery book   Does not apply Once  . amiodarone  200 mg Oral Daily  . amLODipine  10 mg Oral Daily  . citalopram  10 mg Oral Daily  . dabigatran  150 mg Oral BID  . hydrALAZINE  25 mg Oral TID  . ipratropium-albuterol  3 mL Nebulization TID  . lisinopril  40 mg Oral Daily  . metoprolol succinate  12.5 mg Oral Daily  . multivitamin with minerals  1 tablet Oral Daily  . niacin  1,000 mg Oral QHS  . potassium chloride SA  20 mEq Oral Daily  . pravastatin  80 mg Oral Daily  . sodium chloride  3 mL Intravenous Q12H   Continuous Infusions:   Pamella Pert, MD [facility] Pager [phone]. If 7 PM - 7 AM, please contact night-coverage at [email or web address], password [facility] 01/12/2015, 1:23 PM  LOS: 2 days

## 2015-01-12 NOTE — Progress Notes (Signed)
STROKE TEAM PROGRESS NOTE   HISTORY [name] [name] is a 74 y.o. male previously admitted to [facility] from 12/24/2014 to 12/25/2014 for evaluation of dizziness and right hemiparesis with hemisensory loss. The patient has an implantable defibrillator so he could not have an MRI. Head CT, as well as CT angiogram of the head and neck did not reveal any significant findings. Other history for this patient includes atrial fibrillation on Pradaxa, coronary artery disease, previous left carotid endarterectomy, AAA, COPD, hyperlipidemia, hypertension, ischemic cardiomyopathy, and previous stroke. He is on Pradaxa for atrial fibrillation and denies missing any doses. Prior to his previous stroke he was on warfarin.  Today 01/10/2015 the patient was readmitted to [facility] for evaluation of right hemiparesis noted after awakening this morning. A CT of the head today did not show any acute abnormalities.He is on Pradaxa for atrial fibrillation and denies missing any doses. Prior to his previous stroke he was on warfarin.  The patient reports that he woke up at 4 AM and did not feel well. He went to the bathroom and vomited, felt unsteady on his feet, so he went back to bed. He got up again at 7 AM and vomited again. He checked his blood pressure - systolic was 225. He does not remember diastolic. He noted unsteadiness and right-sided weakness. He took his a.m. meds. His blood pressure remained elevated. He noted numbness and tingling on the right side. He was brought to [facility] emergency department as a code stroke and admitted. He has an appointment to see his cardiologist Dr. [name] tomorrow morning. I sent a page to the physician assistant informing them of the patient's admission.  He was last known well Jan 09, 2015, Unable to determine time.  Patient was not administered TPA secondary to delay in arrival. He was admitted for further evaluation and treatment.   SUBJECTIVE  (INTERVAL HISTORY) His daughter is at the bedside. He is waiting to work with PT. He hopes for discharge today. Patient had recent depression, anxiety, worris about AAA rupture, extensive grief period, although other causes for his symptoms leading for this admission. The long discussion with him and his daughter, they expressed an understanding.    OBJECTIVE Temp:  [97.7 F (36.5 C)-98.6 F (37 C)] 97.8 F (36.6 C) (02/02 0948) Pulse Rate:  [59-73] 73 (02/02 0948) Cardiac Rhythm:  [-] A-V Sequential paced (02/02 0759) Resp:  [20] 20 (02/02 0948) BP: (117-158)/(45-76) 151/69 mmHg (02/02 0948) SpO2:  [93 %-97 %] 96 % (02/02 0951)   Recent Labs Lab 01/10/15 0940  GLUCAP 94    Recent Labs Lab 01/10/15 1010 01/10/15 1047  NA 145 146*  K 3.8 3.7  CL 111 107  CO2 25  --   GLUCOSE 104* 97  BUN 12 12  CREATININE 1.03 1.00  CALCIUM 8.7  --     Recent Labs Lab 01/10/15 1010  AST 18  ALT 13  ALKPHOS 86  BILITOT 0.9  PROT 6.0  ALBUMIN 3.5    Recent Labs Lab 01/10/15 1010 01/10/15 1047  WBC 6.0  --   NEUTROABS 3.7  --   HGB 14.8 16.3  HCT 45.6 48.0  MCV 92.7  --   PLT 149*  --    No results for input(s): CKTOTAL, CKMB, CKMBINDEX, TROPONINI in the last 168 hours.  Recent Labs  01/10/15 1010  LABPROT 16.5*  INR 1.32    Recent Labs  01/10/15 1354  COLORURINE YELLOW  LABSPEC  1.019  PHURINE 7.0  GLUCOSEU NEGATIVE  HGBUR NEGATIVE  BILIRUBINUR NEGATIVE  KETONESUR NEGATIVE  PROTEINUR 100*  UROBILINOGEN 1.0  NITRITE NEGATIVE  LEUKOCYTESUR NEGATIVE       Component Value Date/Time   CHOL 144 12/24/2014 0333   TRIG 154* 12/24/2014 0333   HDL 38* 12/24/2014 0333   CHOLHDL 3.8 12/24/2014 0333   VLDL 31 12/24/2014 0333   LDLCALC 75 12/24/2014 0333   Lab Results  Component Value Date   HGBA1C 5.5 12/24/2014      Component Value Date/Time   LABOPIA NONE DETECTED 01/14/2013 0417   COCAINSCRNUR NONE DETECTED 01/14/2013 0417   LABBENZ NONE DETECTED  01/14/2013 0417   AMPHETMU NONE DETECTED 01/14/2013 0417   THCU NONE DETECTED 01/14/2013 0417   LABBARB NONE DETECTED 01/14/2013 0417    No results for input(s): ETH in the last 168 hours.  Dg Chest 2 View 01/10/2015    No acute cardiopulmonary disease.     Ct Head Wo Contrast 01/11/2015    No acute intracranial abnormality.  White matter changes likely represent chronic small vessel ischemic disease.  Diffuse paranasal sinus disease.    01/10/2015    1. No acute intracranial abnormalities. 2. Age related volume loss and mild chronic microvascular ischemic change. 3. Significant sinus disease  CTA NECK  12/25/2014  1. No hemodynamically significant stenosis or vascular occlusion identified within the neck.  2. Moderate calcified plaque at the right carotid bifurcation and proximal right ICA with associated mild stenosis of 20-30% by NASCET criteria.  3. Sequelae of prior left carotid endarterectomy. The left ICA is widely patent.   CTA BRAIN 12/25/2014  1. No proximal branch occlusion identified within the intracranial circulation.  2. Moderate calcified atheromatous disease within the cavernous ICAs with associated stenosis of up to 50% by NASCET criteria.  3. Mild atherosclerotic plaque within the distal vertebral arteries bilaterally without significant stenosis.   Carotid Doppler Done Nov 2015 - R ICA < 40%, L ICA patent s/p hx CEA  2-D echocardiogram  12/25/2014 - Left ventricle: The cavity size was severely dilated. Wallthickness was increased in a pattern of mild LVH. Systolicfunction was mildly to moderately reduced. The estimated ejectionfraction was in the range of 40% to 45%. Diffuse hypokinesis.There is akinesis of the inferior myocardium. Doppler parametersare consistent with abnormal left ventricular relaxation (grade 1diastolic dysfunction). - Left atrium: The atrium was moderately dilated. Impressions: - Inferior akinesis with mild to moderately reduced LV  function;grade 1 diastolic dysfunction; if clinically indicated, TEE wouldhave better sensitivity for source of embolus.   PHYSICAL EXAM  Temp:  [97.7 F (36.5 C)-98.6 F (37 C)] 97.8 F (36.6 C) (02/02 0948) Pulse Rate:  [59-73] 73 (02/02 0948) Resp:  [20] 20 (02/02 0948) BP: (117-158)/(45-76) 151/69 mmHg (02/02 0948) SpO2:  [93 %-97 %] 96 % (02/02 0951)  General - Well nourished, well developed, in no apparent distress.  Ophthalmologic - fundi not visualized due to eye movement.  Cardiovascular - Regular rate and rhythm with no murmur.  Mental Status -  Level of arousal and orientation to time, place, and person were intact. Language including expression, naming, repetition, comprehension was assessed and found intact.  Cranial Nerves II - XII - II - Visual field intact OU. III, IV, VI - Extraocular movements intact. V - Facial sensation subjectively decreased on the right but he also felt decreased frontal vibration on the left. VII - Facial movement intact bilaterally. VIII - Hearing & vestibular intact bilaterally. X - Palate  elevates symmetrically. XI - Chin turning & shoulder shrug intact bilaterally. XII - Tongue protrusion intact.  Motor Strength - The patient's strength testing showed mild right side give-away weakness but pronator drift was absent.  Bulk was normal and fasciculations were absent.   Motor Tone - Muscle tone was assessed at the neck and appendages and was normal.  Reflexes - The patient's reflexes were normal in all extremities and he had no pathological reflexes.  Sensory - Light touch, temperature/pinprick subjective decreased on the right were assessed and were normal.    Coordination - The patient had normal movements in right with no ataxia or dysmetria.  Mild tremor was present on the left hand.  Gait and Station - not tested due to safety concerns.  Dix-Hallpike testing -  No nystagmus seen but pt report reproducible for his  episodes.  ASSESSMENT/PLAN Mr. SAMI FROH is a 75 y.o. male with history of atrial fibrillation on Pradaxa, coronary artery disease, prior left CEA, AAA, COPD, hyperlipidemia, hypertension, ischemic cardiomyopathy and previous stroke  presenting with dizziness and right hemiparesis with hemisensory loss, dizziness and vomiting. He did not receive IV t-PA due to delay in arrival.   ? BPPV - No acute stroke.  Resultant  Right arm tingling/numbness/weakness - subjective and give away weakness  Gilberto Better test ? positive  Initial CT and repeat CT without stroke  Carotid Doppler  No significant stenosis   2D Echo  No source of embolus   pradaxafor VTE prophylaxis  Diet Heart thin liquids  pradaxa (dabigatran) prior to admission, now on pradaxa (dabigatran)  Stress and depression likely associated with neuro symptoms. Recommend depression treatment.  Therapy recommendations:  CIR  Recommend BPPV maneuver by PT/OT - ordered yesterday  Recommend vestibular evaluation by PT - ordered yesterday  Disposition:  Pending, may go home  Depression  Prolonged grief period  Clinically depression  Recommend depression treatment as per primary team  Started on celexa yesterday  No SI or HI  Atrial Fibrillation  Home meds:  pradaxa, continued in the hospital  Rate controlled - on metoprolol  Rhythm control - on amiodarone   Hypertension  Stable  Home meds - lasix, hydralazine  Resumed on admission  Close monitoring  Hyperlipidemia  Home meds:  Pravachol 80, resumed in hospital  LDL 75, goal < 70  Continue statin at discharge  Other Stroke Risk Factors  Advanced age  Former Cigarette smoker, quit smoking 10 years ago  Obesity, Body mass index is 32 kg/(m^2).   Hx stroke/TIA  Family hx stroke (maternal uncle)  History left CEA (Dr. 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 [name])  Other Pertinent History  Back surgery with resultant loss on sensation R leg  NO FURTHER STROKE WORKUP INDICATED  Ongoing risk factor control by Primary Care Physician  Stroke Service will sign off. Please call should any needs arise.  Follow up with neurology as needed  Hospital day # 2  [facility] See Amion for Pager information 01/12/2015 9:54 AM   I, the attending vascular neurologist, have personally obtained a history, examined the patient, evaluated laboratory data, individually viewed imaging studies and agree with radiology interpretations. I also obtained additional history from pt's daughter at bedside. I also discussed with Dr.  Gerghe regarding his care plan. Together with the NP/PA, we formulated the assessment and plan of care which reflects our mutual decision.  I have made any additions or clarifications directly to the above note and agree with the findings and plan as currently documented.   74 yo M with multiple stroke risk factors admitted for recurrent episode of dizziness and right side numbness with vomiting. Acute stroke work up negative. On pradaxa for afib. Exam showed functional component. Pt has a long time stress, anxiety and depression due to loss of wife in 07/2014. Started on celexa, recommend PT education for BPPV maneuver training. He had mild right-sided giveaway weakness, stable for discharge from neurology standpoint.  Neurology will sign off. Please call with questions. Thanks for the consult.  Marvel Plan, MD PhD Stroke Neurology 01/12/2015 12:39 PM       To contact Stroke Continuity provider, please refer to [email or web address]. After hours, contact General Neurology

## 2015-01-12 NOTE — Progress Notes (Signed)
Talked to patient about Outpatient physical therapy; patient is agreeable to go to the [facility] for Vestibular PT; orders/ clinical information faxed to the center; the rehab center will contact the patient at home for a start up date and time of therapyAlexis [name] [phone]

## 2015-01-12 NOTE — Evaluation (Signed)
Occupational Therapy Evaluation Patient Details Name: ARSEN MANGIONE MRN: [id number] DOB: 12/26/1940 Today's Date: 01/12/2015    History of Present Illness Pt is a 74 y.o. Male admitted 01/10/15 Rt sided weakness, dizziness, and vomiting. CT was negative for acute abnormality.CT was negative for acute abnormality.CT head negative for acute changes (1/31 and repeat 2/1) PMH: Recent hospital admission (12/23/14) with similar complaints; CAD s/p AICD/Pacemaker, ischemic cardiomyopathy (EF 20-25%), A-fib on Coumadin, HTN, CRI, and AAA.   Clinical Impression   Patient independent PTA. Patient currently functioning at an overall supervision>steady assist level. Patient will benefit from acute OT to increase overall independence in the areas of ADLs, functional mobility, overall safety in order to safely discharge to CIR vs home. IF unable to discharge to CIR, recommend HHOT with 24/7 supervision/assistance at this time.     Follow Up Recommendations  CIR;Supervision/Assistance - 24 hour    Equipment Recommendations  None recommended by OT    Recommendations for Other Services  Vestibular evaluation      Precautions / Restrictions Precautions Precautions: Fall Restrictions Weight Bearing Restrictions: No      Mobility Bed Mobility Overal bed mobility: Modified Independent             General bed mobility comments: incr time and effort  Transfers Overall transfer level: Needs assistance Equipment used: Rolling walker (2 wheeled) Transfers: Sit to/from Stand Sit to Stand: Min guard         General transfer comment: cues required for safe and effective technique     Balance Overall balance assessment: History of Falls;Needs assistance Sitting-balance support: No upper extremity supported;Feet supported Sitting balance-Leahy Scale: Good     Standing balance support: Bilateral upper extremity supported Standing balance-Leahy Scale: Fair     ADL Overall ADL's : Needs  assistance/impaired     General ADL Comments: Patient overall supervision>steady assist for ADLs. Patient with ataxia like movements during functional tasks and functional ambulation/mobility. Educated patient on importance of 2 hands >RW during mobility and no furniture walking. Patient continues to need education on RW safety and overall safety throughout the home. Administerred red foam to build up items to increase independence with use of RUE.      Vision  Patient with complaints of blurring of vision in right eye since last admission. To be further tested in functional context.    Perception Perception Perception Tested?: No   Praxis Praxis Praxis tested?: Within functional limits    Pertinent Vitals/Pain Pain Assessment: No/denies pain     Hand Dominance Right   Extremity/Trunk Assessment Upper Extremity Assessment Upper Extremity Assessment: RUE deficits/detail RUE Deficits / Details: Decreased strength throughout RUE, Shoulder grossly=3+/5, elbow flexion=4/5, elbow extension=2+/5, grip=3/5 RUE Coordination: decreased gross motor   Lower Extremity Assessment Lower Extremity Assessment: Defer to PT evaluation   Cervical / Trunk Assessment Cervical / Trunk Assessment: Normal   Communication Communication Communication: No difficulties   Cognition Arousal/Alertness: Awake/alert Behavior During Therapy: WFL for tasks assessed/performed Overall Cognitive Status: Within Functional Limits for tasks assessed             Home Living Family/patient expects to be discharged to:: Private residence Living Arrangements: Other (Comment) (pt reports step daughter lives with him, but she's never there) Available Help at Discharge: Family;Available 24 hours/day (pt and daughter report that something can be arranged) Type of Home: Mobile home Home Access: Stairs to enter Entrance Stairs-Number of Steps: 2 Entrance Stairs-Rails: Can reach both Home Layout: One level  Bathroom Shower/Tub: Corporate investment banker: Standard     Home Equipment: Environmental consultant - 2 wheels;Grab bars - tub/shower;Wheelchair - manual;Cane - single point;Other (comment);Bedside commode;Tub bench (hip kit)          Prior Functioning/Environment Level of Independence: Independent        Comments: reports he fell x1 since last admission; missed reaching for a chair as walking (furniture walking due to imbalance; denied dizziness)    OT Diagnosis: Generalized weakness;Ataxia   OT Problem List: Decreased strength;Decreased activity tolerance;Impaired balance (sitting and/or standing);Decreased coordination;Decreased safety awareness;Decreased knowledge of use of DME or AE;Decreased knowledge of precautions   OT Treatment/Interventions: Self-care/ADL training;Therapeutic exercise;Energy conservation;DME and/or AE instruction;Therapeutic activities;Patient/family education;Balance training    OT Goals(Current goals can be found in the care plan section) Acute Rehab OT Goals Patient Stated Goal: go to rehab ADL Goals Pt Will Perform Eating: with modified independence;with adaptive utensils;sitting Pt Will Perform Grooming: with modified independence;with adaptive equipment;standing Pt Will Transfer to Toilet: with modified independence;ambulating;bedside commode Pt Will Perform Tub/Shower Transfer: with modified independence;tub bench;ambulating;rolling walker Pt/caregiver will Perform Home Exercise Program: Right Upper extremity;Independently;With written HEP provided;Increased strength  OT Frequency: Min 2X/week   Barriers to D/C: Decreased caregiver support          End of Session Equipment Utilized During Treatment: Gait belt;Rolling walker  Activity Tolerance: Patient tolerated treatment well Patient left: in bed;with call bell/phone within reach;with family/visitor present;Other (comment) (SLP entering room)   Time: [phone] OT Time Calculation  (min): 37 min Charges:  OT General Charges $OT Visit: 1 Procedure OT Evaluation $Initial OT Evaluation Tier I: 1 Procedure OT Treatments $Self Care/Home Management : 8-22 mins G-Codes: OT G-codes **NOT FOR INPATIENT CLASS** Functional Limitation: Self care Self Care Current Status (B3403): At least 20 percent but less than 40 percent impaired, limited or restricted Self Care Goal Status (J0964): At least 1 percent but less than 20 percent impaired, limited or restricted  Danay Mckellar , MS, OTR/L, CLT Pager: [phone]  01/12/2015, 9:01 AM

## 2015-01-12 NOTE — Progress Notes (Signed)
Physical Therapy Treatment Patient Details Name: [name] [name] MRN: [id number] DOB: 07/09/1941 Today's Date: 01/12/2015    History of Present Illness Pt is a 74 y.o. Male admitted 01/10/15 Rt sided weakness, dizziness, and vomiting. CT was negative for acute abnormality.CT was negative for acute abnormality.CT head negative for acute changes (1/31 and repeat 2/1). 2/1 MD requested vestibular evaluation PMH: Recent hospital admission (12/23/14) with similar complaints; CAD s/p AICD/Pacemaker, ischemic cardiomyopathy (EF 20-25%), A-fib on Coumadin, HTN, CRI, and AAA.    PT Comments    Patient assessed for BPPV. Supine head roll to the right with ?vertigo-like symptoms.  Hallpike-Dix did create vertigo symptoms (testing Rt posterior canal), however symptoms lasted nearly 2 minutes (and ? If they had truly subsided when pt said they had). Completed Epley maneuver for Rt posterior canal BPPV. Difficult to appreciate nystagmus with all testing (although pt describing vertigo symptoms during testing). Pt has had symptoms for many months and has likely compensated and no longer shows nystagmus because he can fixate his eyes. He will need further Outpatient Vestibular Rehab where Frenzel lenses can be used during testing. Question if pt has multi-canal involvement vs cupulolithiasis.  Pt and daughter educated on peripheral vestibular system and potential cause for his symptoms. They are agreeable to follow-up with OPPT.    Follow Up Recommendations  Supervision/Assistance - 24 hour;Outpatient PT (24/7 initially at least)     Equipment Recommendations  Rolling walker with 5" wheels    Recommendations for Other Services       Precautions / Restrictions Precautions Precautions: Fall    Mobility  Bed Mobility Overal bed mobility: Modified Independent             General bed mobility comments: incr time and effort; required assist to perform rolling, etc during vestibular assessment and  treatment  Transfers Overall transfer level: Needs assistance Equipment used: Rolling walker (2 wheeled) Transfers: Sit to/from Stand Sit to Stand: Min guard         General transfer comment: vc for safe technique x1; on repetition x2, no cues;  guarding due to vestibular symptoms, no LOB  Ambulation/Gait Ambulation/Gait assistance: Min assist Ambulation Distance (Feet): 150 Feet Assistive device: Rolling walker (2 wheeled) Gait Pattern/deviations: Step-through pattern;Staggering right     General Gait Details: with RW pt able to maintain straight path, however states he still feels pull to his Rt; one stagger to his right when turning Rt and looking Rt required min assist to prevent fall   Stairs            Wheelchair Mobility    Modified Rankin (Stroke Patients Only) Modified Rankin (Stroke Patients Only) Pre-Morbid Rankin Score: No symptoms Modified Rankin: Moderately severe disability     Balance     Sitting balance-Leahy Scale: Good       Standing balance-Leahy Scale: Poor                      Cognition Arousal/Alertness: Awake/alert Behavior During Therapy: WFL for tasks assessed/performed Overall Cognitive Status: Within Functional Limits for tasks assessed                      Exercises      General Comments General comments (skin integrity, edema, etc.): daughter present      Pertinent Vitals/Pain Pain Assessment: No/denies pain    Home Living  Prior Function            PT Goals (current goals can now be found in the care plan section) Acute Rehab PT Goals Patient Stated Goal: wants to be safe on his feet before he goes home Time For Goal Achievement: 01/18/15 Potential to Achieve Goals: Good Progress towards PT goals: Progressing toward goals    Frequency  Min 4X/week    PT Plan Discharge plan needs to be updated    Co-evaluation             End of Session Equipment  Utilized During Treatment: Gait belt Activity Tolerance: Patient tolerated treatment well Patient left: with family/visitor present;in chair;with call bell/phone within reach;with chair alarm set     Time: 4098-1191 PT Time Calculation (min) (ACUTE ONLY): 47 min  Charges:  $Gait Training: 8-22 mins $Therapeutic Activity: 8-22 mins $Canalith Rep Proc: 8-22 mins                    G Codes:      [name] [name] 01/16/2015, 2:55 PM Pager [phone]

## 2015-01-12 NOTE — Progress Notes (Signed)
Pt transported out of unit per wheelchair by Vania Rea. Nurse tech. No acute distress noted.   [name] [name] I 01/12/2015 4:35 PM

## 2015-01-12 NOTE — Discharge Summary (Signed)
Physician Discharge Summary  [name] [name] FVO:[id number] DOB: 1941/03/05 DOA: 01/10/2015  PCP: [name] Rossetti, MD  Admit date: 01/10/2015 Discharge date: 01/12/2015  Time spent: 45 minutes  Recommendations for Outpatient Follow-up:  1. Follow up with Dr. Shary Decamp in 1-2 weeks 2. Follow up with Dr. [name] in 1-2 weeks 3. Follow up with Dr. Roda Shutters in Stroke clinic  Discharge Diagnoses:  Principal Problem:   CVA (cerebral infarction) Active Problems:   HYPERCHOLESTEROLEMIA, PURE   HYPERTENSION, BENIGN   Cardiomyopathy, ischemic   COPD (chronic obstructive pulmonary disease)   Chronic systolic heart failure   Automatic implantable cardioverter-defibrillator in situ   BPPV (benign paroxysmal positional vertigo)   Depression  Discharge Condition: stable  Diet recommendation: heart healthy  Filed Weights   01/10/15 2325  Weight: 110 kg (242 lb 8.1 oz)    History of present illness:  [name] [name] is a 74 y.o. male with a Past Medical History of history of CVA, history of A. fib on anticoagulation, history of chronic systolic heart failure, history of CAD, history of AICD/pacemaker implantation, history of AAA under surveillance who presents today with Right-sided weakness since waking up this morning. Patient was just recently discharged from this facility on 12/25/14 after he was hospitalized for right-sided weakness. He was initially on Coumadin and change to Pradaxa on discharge. He presents today, as he woke up earlier this morning with more right-sided weakness in his usual baseline. Per patient, he noted he was "staggering" when he walked to his bathroom this morning. He subsequently had 2 episodes of vomiting. He continued to have staggering gait, and he noticed that he was being pulled to the right. He thinks that his usual right-sided weakness has worsened much more than his recent baseline. He subsequently was brought to the emergency room, repeat CT scan of the head was  negative for acute abnormality, I was asked to admit this patient for further evaluation and treatment  Hospital Course:  Acute CVA / right sided weakness / dizziness - recent workup, will not repeat - continue Pradaxa - Neuro following, per neuro exam not consistent with CVA, component of vertigo, some of the symptoms related to stress and depression - PT vestibular evaluated and recommended outpatient PT, arranged with help of case manager Depression - patient's wife passed last August, endorses depression - started Celexa HTN - continue home medications Cardiomyopathy, ischemic Atrial fibrillation - Continue Amiodarone, Metoprolol - continue Pradaxa Abdominal aortic aneurysm - monitored as an outpatient Chronic systolic heart failure - euvolemic Atomatic implantable cardioverter-defibrillator in situ Renal insufficiency - monitor, Cr at baseline COPD - stable, no wheezing  Procedures:  None    Consultations:  Neurology   Discharge Exam: Filed Vitals:   01/12/15 0544 01/12/15 0948 01/12/15 0951 01/12/15 1258  BP: 128/46 151/69  152/62  Pulse: 63 73  62  Temp: 98.4 F (36.9 C) 97.8 F (36.6 C)  97.7 F (36.5 C)  TempSrc: Oral Oral  Oral  Resp: 20 20  20   Height:      Weight:      SpO2: 93% 95% 96% 94%    General: NAD Cardiovascular: RRR Respiratory: CTA biL  Discharge Instructions     Medication List    TAKE these medications        albuterol 108 (90 BASE) MCG/ACT inhaler  Commonly known as:  PROVENTIL HFA;VENTOLIN HFA  Inhale 2 puffs into the lungs every 4 (four) hours as needed for wheezing.     ALEVE 220 MG  tablet  Generic drug:  naproxen sodium  Take 440 mg by mouth 2 (two) times daily as needed (for pain).     amiodarone 200 MG tablet  Commonly known as:  PACERONE  TAKE 1 TABLET EVERY DAY     amLODipine 10 MG tablet  Commonly known as:  NORVASC  Take 1 tablet (10 mg total) by mouth daily.     citalopram 10 MG tablet  Commonly known as:   CELEXA  Take 1 tablet (10 mg total) by mouth daily.     dabigatran 150 MG Caps capsule  Commonly known as:  PRADAXA  Take 1 capsule (150 mg total) by mouth 2 (two) times daily.     docusate sodium 100 MG capsule  Commonly known as:  COLACE  Take 1 capsule (100 mg total) by mouth every 12 (twelve) hours.     furosemide 40 MG tablet  Commonly known as:  LASIX  Take 40 mg by mouth daily as needed for fluid or edema.     hydrALAZINE 50 MG tablet  Commonly known as:  APRESOLINE  Take 0.5 tablets (25 mg total) by mouth 3 (three) times daily.     ipratropium-albuterol 0.5-2.5 (3) MG/3ML Soln  Commonly known as:  DUONEB  Take 3 mLs by nebulization every 4 (four) hours as needed (for shortness of breath).     lisinopril 40 MG tablet  Commonly known as:  PRINIVIL,ZESTRIL  TAKE 1 TABLET EVERY DAY     metoprolol succinate 25 MG 24 hr tablet  Commonly known as:  TOPROL-XL  Take 1 tablet (25 mg total) by mouth daily.     multivitamin with minerals Tabs tablet  Take 1 tablet by mouth daily.     niacin 500 MG CR tablet  Commonly known as:  NIASPAN  Take 1,000 mg by mouth at bedtime.     potassium chloride SA 20 MEQ tablet  Commonly known as:  K-DUR,KLOR-CON  TAKE 2 TABLETS THREE TIMES DAILY     pravastatin 80 MG tablet  Commonly known as:  PRAVACHOL  Take 1 tablet (80 mg total) by mouth daily.           Follow-up Information    Follow up with [facility].   Specialty:  Rehabilitation   Why:  they will call you at home for a start up date and time for your physical therapy   Contact information:   [address] [id number] mc [place] [address] [phone]      Follow up with [name] Rossetti, MD. Schedule an appointment as soon as possible for a visit in 2 weeks.   Specialty:  Internal Medicine   Contact information:   [address] [place] [address] [phone]       Follow up with Xu,Jindong, MD.  Schedule an appointment as soon as possible for a visit in 1 month.   Specialty:  Neurology   Contact information:   [address] [place] [address] [phone]       The results of significant diagnostics from this hospitalization (including imaging, microbiology, ancillary and laboratory) are listed below for reference.    Significant Diagnostic Studies: Ct Angio Head W/cm &/or Wo Cm  12/25/2014   CLINICAL DATA:  Initial valuation for weakness and unsteadiness with vertigo. Right-sided paresthesias.  EXAM: CT ANGIOGRAPHY HEAD AND NECK  TECHNIQUE: Multidetector CT imaging of the head and neck was performed using the standard protocol during bolus administration of intravenous  contrast. Multiplanar CT image reconstructions and MIPs were obtained to evaluate the vascular anatomy. Carotid stenosis measurements (when applicable) are obtained utilizing NASCET criteria, using the distal internal carotid diameter as the denominator.  CONTRAST:  50mL OMNIPAQUE IOHEXOL 350 MG/ML SOLN  COMPARISON:  Prior noncontrast head CT  FINDINGS: CT HEAD  Brain: Diffuse prominence of the CSF containing spaces is consistent with generalized cerebral atrophy. Patchy and confluent hypodensity within the periventricular and deep white matter both cerebral hemispheres again seen, most compatible with chronic small vessel ischemic disease. No definite new or evolving cortical or large vessel territory infarct. Hypodensity within the mesial right temporal lobe slightly more prominent on axial sequence, likely to slice selection. No intracranial hemorrhage.  Calvarium and skull base: No acute abnormality seen within calvarium and skullbase. Scalp soft tissues within normal limits.  Paranasal sinuses: Scattered paranasal sinus disease stable from prior.  Orbits: No acute abnormality seen about the orbits.  CTA NECK  Aortic arch: Aortic arch is of normal caliber in without aneurysm. Moderate atheromatous plaque  present within the aortic arch and at the origin of the great vessels. No high-grade stenosis seen at the origin of the great vessels. Left-sided pacemaker partially visualized. Visualized subclavian arteries well opacified bilaterally.  Right carotid system: Calcified atheromatous disease present at the origin of the right common carotid artery without hemodynamically significant stenosis. The right common carotid artery is well opacified to the carotid bifurcation. There is calcified plaque about mild scattered plaque present within the distal right ICA is well without significant stenosis.  Left carotid system: Left common carotid artery tortuous but widely patent to the carotid bifurcation. Postoperative changes from prior left carotid endarterectomy present. The left ICA at the endarterectomy site is mildly tortuous but widely patent. Distally, the left ICA is well opacified to the level of the skullbase, although there is mild calcified plaque just prior to the ICA entering the petrous bone.  Vertebral arteries: Both vertebral arteries arise from the subclavian arteries. Proximal left vertebral artery is tortuous. Prominent calcifications present about the origin of the right vertebral artery. The vertebral arteries are well opacified to the vertebrobasilar junction without significant stenosis or occlusion.  Emphysematous changes noted within the visualized lungs. No soft tissue abnormality within the neck. No adenopathy. Thyroid gland within normal limits.  Reversal of the normal cervical lordosis with moderate degenerative disc disease at C5-6, C6-7, C7-T1. Sternotomy wires partially visualized. No worrisome lytic or blastic osseous lesions.  CTA HEAD  Anterior circulation: Scattered calcified plaque present within the petrous segments bilaterally without significant stenosis. More heavy atherosclerotic calcifications present within the cavernous ICAs with associated stenosis of approximately 50% by NASCET  criteria bilaterally. Supra clinoid segments within normal limits. A1 segments well opacified bilaterally. Anterior communicating artery normal. Anterior cerebral arteries within normal limits.  No proximal M1 branch occlusion no significant stenosis within the proximal middle cerebral arteries. Distal branch atherosclerotic irregularity present within the MCA branches bilaterally.  Posterior circulation: Vertebral arteries are codominant. Moderate atheromatous plaque present within the mid vertebral arteries bilaterally without hemodynamically significant stenosis. Posterior inferior cerebral arteries well opacified. Vertebrobasilar junction and basilar artery within normal limits. Superior cerebellar arteries well opacified. Posterior cerebral arteries well opacified to their distal aspects without proximal branch occlusion or significant stenosis. Mild multi focal atherosclerotic irregularity present within the posterior cerebral arteries bilaterally. Prominent posterior communicating arteries present bilaterally.  Venous sinuses: No abnormality identified within the venous sinuses.  Anatomic variants: No aneurysm or vascular malformation.  Delayed phase: No abnormal enhancement on delayed sequence.  IMPRESSION: CTA NECK IMPRESSION:  1. No hemodynamically significant stenosis or vascular occlusion identified within the neck. 2. Moderate calcified plaque at the right carotid bifurcation and proximal right ICA with associated mild stenosis of 20-30% by NASCET criteria. 3. Sequelae of prior left carotid endarterectomy. The left ICA is widely patent.  CTA BRAIN IMPRESSION:  1. No proximal branch occlusion identified within the intracranial circulation. 2. Moderate calcified atheromatous disease within the cavernous ICAs with associated stenosis of up to 50% by NASCET criteria. 3. Mild atherosclerotic plaque within the distal vertebral arteries bilaterally without significant stenosis.   Electronically Signed   By:  Rise Mu M.D.   On: 12/25/2014 00:08   Dg Chest 2 View  01/10/2015   CLINICAL DATA:  Patient states that since last night he has been having right sided numbness with pains and dizziness, unable to stand for exam due to weakness  EXAM: CHEST  2 VIEW  COMPARISON:  12/16/2014  FINDINGS: Changes from cardiac surgery are stable. Cardiac silhouette is mildly enlarged. No mediastinal or hilar masses or convincing adenopathy.  Lungs are mildly hyperexpanded. No lung consolidation or edema. No pleural effusion or pneumothorax.  Left anterior chest wall biventricular cardioverter-defibrillator appear stable  Bony thorax is demineralized but grossly intact.  IMPRESSION: No acute cardiopulmonary disease.   Electronically Signed   By: Amie Portland M.D.   On: 01/10/2015 12:08   Dg Chest 2 View  12/16/2014   CLINICAL DATA:  Amiodarone surveillance, atrial fibrillation  EXAM: CHEST  2 VIEW  COMPARISON:  04/30/2014  FINDINGS: Cardiomediastinal silhouette is stable. Again noted status post CABG in enhanced sternotomy. Three leads cardiac pacemaker is unchanged in position. No acute infiltrate or pulmonary edema. Hyperinflation again noted. Bony thorax is unremarkable.  IMPRESSION: No active disease. Hyperinflation again noted. Stable 3 leads cardiac pacemaker position. Status post CABG.   Electronically Signed   By: Natasha Mead M.D.   On: 12/16/2014 16:24   Ct Head Wo Contrast  01/11/2015   CLINICAL DATA:  Right-sided weakness.  Prior CVA.  EXAM: CT HEAD WITHOUT CONTRAST  TECHNIQUE: Contiguous axial images were obtained from the base of the skull through the vertex without contrast.  COMPARISON:  01/10/2015  FINDINGS: Stable patchy low-density throughout the white matter, particularly in the parietal subcortical white matter. No evidence for acute hemorrhage, mass lesion, midline shift, hydrocephalus or large infarct. There is extensive mucosal disease throughout the paranasal sinuses. This is most prominent in  the left maxillary sinus and left frontal sinus. Large polyp or retention cyst in the anterior right sphenoid sinus. No acute bone abnormality.  IMPRESSION: No acute intracranial abnormality.  White matter changes likely represent chronic small vessel ischemic disease.  Diffuse paranasal sinus disease.   Electronically Signed   By: Richarda Overlie M.D.   On: 01/11/2015 07:59   Ct Head Wo Contrast  01/10/2015   CLINICAL DATA:  Right sided extremity weakness, Right facial droop, and dizziness upon waking this morning at 0400am. Pt states he felt fine when he went to bed last night Hx: A-fib, CAD, HTN, CHF, MI, CVA  EXAM: CT HEAD WITHOUT CONTRAST  TECHNIQUE: Contiguous axial images were obtained from the base of the skull through the vertex without intravenous contrast.  COMPARISON:  12/24/2014  FINDINGS: Ventricles are normal in configuration. There is ventricular and sulcal enlargement reflecting age related volume loss. No hydrocephalus.  No parenchymal masses or mass effect.  There is  no evidence of a cortical infarct.  Patchy areas of white matter hypoattenuation noted consistent with mild chronic microvascular ischemic change.  There are no extra-axial masses or abnormal fluid collections.  There is no intracranial hemorrhage.  There is sinus disease. Left frontal sinus is opacified. There is mucosal thickening with some dependent fluid in multiple ethmoid air cells. Dependent fluid and mucosal thickening is noted in the left maxillary sinus. There is polypoid mucosal thickening in the right maxillary sinus and a mucous retention cyst in the right sphenoid sinus.  Clear mastoid air cells.  IMPRESSION: 1. No acute intracranial abnormalities. 2. Age related volume loss and mild chronic microvascular ischemic change. 3. Significant sinus disease as detailed above.   Electronically Signed   By: Amie Portland M.D.   On: 01/10/2015 10:27   Ct Head Wo Contrast  12/23/2014   CLINICAL DATA:  Dizziness with right-sided  weakness. Initial encounter.  EXAM: CT HEAD WITHOUT CONTRAST  TECHNIQUE: Contiguous axial images were obtained from the base of the skull through the vertex without intravenous contrast.  COMPARISON:  Head CT 01/14/2013 and 01/13/2013.  FINDINGS: There is no evidence of acute intracranial hemorrhage, mass lesion, brain edema or extra-axial fluid collection. The ventricles and subarachnoid spaces are appropriately sized for age. There is no CT evidence of acute cortical infarction. There are stable chronic small vessel ischemic changes within the periventricular white matter. Intracranial vascular calcifications are noted.  There is interval improved aeration of the paranasal sinuses with some residual mucosal thickening, greatest in the left frontal sinus. There are no air-fluid levels. The mastoids and middle ears are clear. The calvarium is intact.  IMPRESSION: No acute intracranial findings. Stable chronic small vessel ischemic changes with interval improved paranasal sinus inflammation.   Electronically Signed   By: Roxy Horseman M.D.   On: 12/23/2014 21:54   Ct Angio Neck W/cm &/or Wo/cm  12/25/2014   CLINICAL DATA:  Initial valuation for weakness and unsteadiness with vertigo. Right-sided paresthesias.  EXAM: CT ANGIOGRAPHY HEAD AND NECK  TECHNIQUE: Multidetector CT imaging of the head and neck was performed using the standard protocol during bolus administration of intravenous contrast. Multiplanar CT image reconstructions and MIPs were obtained to evaluate the vascular anatomy. Carotid stenosis measurements (when applicable) are obtained utilizing NASCET criteria, using the distal internal carotid diameter as the denominator.  CONTRAST:  50mL OMNIPAQUE IOHEXOL 350 MG/ML SOLN  COMPARISON:  Prior noncontrast head CT  FINDINGS: CT HEAD  Brain: Diffuse prominence of the CSF containing spaces is consistent with generalized cerebral atrophy. Patchy and confluent hypodensity within the periventricular and deep  white matter both cerebral hemispheres again seen, most compatible with chronic small vessel ischemic disease. No definite new or evolving cortical or large vessel territory infarct. Hypodensity within the mesial right temporal lobe slightly more prominent on axial sequence, likely to slice selection. No intracranial hemorrhage.  Calvarium and skull base: No acute abnormality seen within calvarium and skullbase. Scalp soft tissues within normal limits.  Paranasal sinuses: Scattered paranasal sinus disease stable from prior.  Orbits: No acute abnormality seen about the orbits.  CTA NECK  Aortic arch: Aortic arch is of normal caliber in without aneurysm. Moderate atheromatous plaque present within the aortic arch and at the origin of the great vessels. No high-grade stenosis seen at the origin of the great vessels. Left-sided pacemaker partially visualized. Visualized subclavian arteries well opacified bilaterally.  Right carotid system: Calcified atheromatous disease present at the origin of the right common carotid  artery without hemodynamically significant stenosis. The right common carotid artery is well opacified to the carotid bifurcation. There is calcified plaque about mild scattered plaque present within the distal right ICA is well without significant stenosis.  Left carotid system: Left common carotid artery tortuous but widely patent to the carotid bifurcation. Postoperative changes from prior left carotid endarterectomy present. The left ICA at the endarterectomy site is mildly tortuous but widely patent. Distally, the left ICA is well opacified to the level of the skullbase, although there is mild calcified plaque just prior to the ICA entering the petrous bone.  Vertebral arteries: Both vertebral arteries arise from the subclavian arteries. Proximal left vertebral artery is tortuous. Prominent calcifications present about the origin of the right vertebral artery. The vertebral arteries are well  opacified to the vertebrobasilar junction without significant stenosis or occlusion.  Emphysematous changes noted within the visualized lungs. No soft tissue abnormality within the neck. No adenopathy. Thyroid gland within normal limits.  Reversal of the normal cervical lordosis with moderate degenerative disc disease at C5-6, C6-7, C7-T1. Sternotomy wires partially visualized. No worrisome lytic or blastic osseous lesions.  CTA HEAD  Anterior circulation: Scattered calcified plaque present within the petrous segments bilaterally without significant stenosis. More heavy atherosclerotic calcifications present within the cavernous ICAs with associated stenosis of approximately 50% by NASCET criteria bilaterally. Supra clinoid segments within normal limits. A1 segments well opacified bilaterally. Anterior communicating artery normal. Anterior cerebral arteries within normal limits.  No proximal M1 branch occlusion no significant stenosis within the proximal middle cerebral arteries. Distal branch atherosclerotic irregularity present within the MCA branches bilaterally.  Posterior circulation: Vertebral arteries are codominant. Moderate atheromatous plaque present within the mid vertebral arteries bilaterally without hemodynamically significant stenosis. Posterior inferior cerebral arteries well opacified. Vertebrobasilar junction and basilar artery within normal limits. Superior cerebellar arteries well opacified. Posterior cerebral arteries well opacified to their distal aspects without proximal branch occlusion or significant stenosis. Mild multi focal atherosclerotic irregularity present within the posterior cerebral arteries bilaterally. Prominent posterior communicating arteries present bilaterally.  Venous sinuses: No abnormality identified within the venous sinuses.  Anatomic variants: No aneurysm or vascular malformation.  Delayed phase: No abnormal enhancement on delayed sequence.  IMPRESSION: CTA NECK  IMPRESSION:  1. No hemodynamically significant stenosis or vascular occlusion identified within the neck. 2. Moderate calcified plaque at the right carotid bifurcation and proximal right ICA with associated mild stenosis of 20-30% by NASCET criteria. 3. Sequelae of prior left carotid endarterectomy. The left ICA is widely patent.  CTA BRAIN IMPRESSION:  1. No proximal branch occlusion identified within the intracranial circulation. 2. Moderate calcified atheromatous disease within the cavernous ICAs with associated stenosis of up to 50% by NASCET criteria. 3. Mild atherosclerotic plaque within the distal vertebral arteries bilaterally without significant stenosis.   Electronically Signed   By: Rise Mu M.D.   On: 12/25/2014 00:08   Labs: Basic Metabolic Panel:  Recent Labs Lab 01/10/15 1010 01/10/15 1047  NA 145 146*  K 3.8 3.7  CL 111 107  CO2 25  --   GLUCOSE 104* 97  BUN 12 12  CREATININE 1.03 1.00  CALCIUM 8.7  --    Liver Function Tests:  Recent Labs Lab 01/10/15 1010  AST 18  ALT 13  ALKPHOS 86  BILITOT 0.9  PROT 6.0  ALBUMIN 3.5   CBC:  Recent Labs Lab 01/10/15 1010 01/10/15 1047  WBC 6.0  --   NEUTROABS 3.7  --   HGB 14.8  16.3  HCT 45.6 48.0  MCV 92.7  --   PLT 149*  --    BNP: BNP (last 3 results)  Recent Labs  12/23/14 1948  BNP 126.1*    CBG:  Recent Labs Lab 01/10/15 0940  GLUCAP 94   Signed:  Amiaya Mcneeley  [facility] 01/12/2015, 4:44 PM

## 2015-01-22 ENCOUNTER — Ambulatory Visit: Payer: Medicare Other | Admitting: Rehabilitative and Restorative Service Providers"

## 2015-01-26 ENCOUNTER — Ambulatory Visit: Payer: Medicare Other | Attending: Internal Medicine | Admitting: Rehabilitative and Restorative Service Providers"

## 2015-01-26 VITALS — BP 138/68

## 2015-01-26 DIAGNOSIS — J449 Chronic obstructive pulmonary disease, unspecified: Secondary | ICD-10-CM | POA: Diagnosis not present

## 2015-01-26 DIAGNOSIS — I5022 Chronic systolic (congestive) heart failure: Secondary | ICD-10-CM | POA: Diagnosis not present

## 2015-01-26 DIAGNOSIS — I639 Cerebral infarction, unspecified: Secondary | ICD-10-CM | POA: Diagnosis not present

## 2015-01-26 DIAGNOSIS — H8110 Benign paroxysmal vertigo, unspecified ear: Secondary | ICD-10-CM | POA: Insufficient documentation

## 2015-01-26 DIAGNOSIS — R269 Unspecified abnormalities of gait and mobility: Secondary | ICD-10-CM | POA: Diagnosis not present

## 2015-01-26 DIAGNOSIS — I10 Essential (primary) hypertension: Secondary | ICD-10-CM | POA: Diagnosis not present

## 2015-01-26 DIAGNOSIS — Z9181 History of falling: Secondary | ICD-10-CM | POA: Diagnosis not present

## 2015-01-27 NOTE — Therapy (Signed)
[facility] [address] [place], [place], [address] Phone: [phone]   Fax:  [phone]  Physical Therapy Evaluation  Patient Details  Name: [name] [name] MRN: [id number] Date of Birth: November 10, 1941 Referring Provider:  [name], MD  Encounter Date: 01/26/2015      PT End of Session - 01/26/15 1436    Visit Number 1   Number of Visits 16   Date for PT Re-Evaluation 03/27/15   PT Start Time 1150   PT Stop Time 1235   PT Time Calculation (min) 45 min   Activity Tolerance Patient tolerated treatment well   Behavior During Therapy [facility] for tasks assessed/performed      Past Medical History  Diagnosis Date  . Atrial fibrillation     s/p prior DCCV;  Amiodarone Rx.  . CAD (coronary artery disease)     s/p CABG;  cath 10/07:  LM ok, LAD occluded, AV CFX 95%, pRCA occluded; L-LAD, S-OM2/OM3, S-PDA ok  . Cerebrovascular disease     s/p prior Left CEA;  followed by Dr. [name]  . COPD (chronic obstructive pulmonary disease)   . Hyperlipidemia   . Hypertension   . Mitral insufficiency     hx of. not noted on echo 01/2012  . Nephrolithiasis   . Ischemic cardiomyopathy     echo 5/09: EF 55%, mild LAE;  Myoview 11/10: inf and apical scar with mild peri-infarct ischemia, EF 44%. Echo 01/26/12 with EF 20-25% but previously known to be 30% on echoes before last one  . Iliac artery aneurysm, left     followed by Dr. [name]  . Aortic dissection     H/O focal aortic dissection  . LBBB (left bundle branch block)   . CHF (congestive heart failure)   . Myocardial infarction   . Renal insufficiency     01/2012  . Stroke     Past Surgical History  Procedure Laterality Date  . Uteroscopy    . Coronary artery bypass graft  1998  . Left cea  07/05  . Appendectomy  09/11/11  . Carotid endarterectomy  2005    left  . Biventricular defibrillator implantation  08/22/12    SJM Quadra Assura BiV ICD implanted by Dr  [name]  . Spine surgery  Sep 27, 2014    Lower Back L 4  and   L 5  . Bi-ventricular implantable cardioverter defibrillator N/A 08/22/2012    Procedure: BI-VENTRICULAR IMPLANTABLE CARDIOVERTER DEFIBRILLATOR  (CRT-D);  Surgeon: [name], MD;  Location: [facility] C[facility] LAB;  Service: Cardiovascular;  Laterality: N/A;    BP 138/68 mmHg  Visit Diagnosis:  No diagnosis found.      Subjective Assessment - 01/26/15 1154    Symptoms The patient had a h/o CVA with most recent CVA in 12/2014.  He returned to the ED in 01/2015 due to waking up with room spinning sensation with nausea/vomitting, room spinning.  He was diagnosed with BPPV.  He reports noting dizziness with getting up quickly, rolling in bed, and also intermittently with sitting still.  Dizziness currently rated 5/10.     Patient Stated Goals Reduce dizziness, move better.   Currently in Pain? No/denies          [facility] PT Assessment - 01/26/15 1159    Assessment   Medical Diagnosis vertigo and stroke   Onset Date --  12/2014 and 01/2015   Precautions   Precautions Fall;ICD/Pacemaker   Balance Screen   Has the  patient fallen in the past 6 months Yes   How many times? --  4, with most recent being last week with bending forward    Has the patient had a decrease in activity level because of a fear of falling?  Yes   Is the patient reluctant to leave their home because of a fear of falling?  No   Home Environment   Living Enviornment Private residence   Type of Home House   Home Access Stairs to enter   Entrance Stairs-Number of Steps --  3   Entrance Stairs-Rails Can reach both   Home Layout One level   Prior Function   Level of Independence --  depression since wife passed away in 07-22-14  Vocation Retired   Observation/Other Assessments   Focus on Therapeutic Outcomes (FOTO)  functional status survey=46%   Other Surveys  --  DHI=46%   Coordination   Heel Shin Test --  Unable to do with the right LE to left knee    Ambulation/Gait   Ambulation/Gait Yes   Ambulation/Gait Assistance 4: Min assist   Ambulation/Gait Assistance Details --  Pt veers to right side during ambulation   Ambulation Distance (Feet) --  75 ft.   Assistive device --  no device, has RW, BSC, shower chair   Ambulation Surface Level   Gait velocity 2.29 ft/sec            Vestibular Assessment - 01/26/15 1157    Type of Dizziness Spinning   Frequency of Dizziness --  daily   Duration of Dizziness --  can stay up to hours   Aggravating Factors Turning head quickly  notes veering with ambulation   Relieving Factors Head stationary   Occulomotor Alignment Normal   Spontaneous Absent   Gaze-induced Absent   Smooth Pursuits Intact  blurring in R gaze between 1-2 o'clock position per report   Saccades Slow   VOR 1 Head Only (x 1 viewing) --  blurred vision with R head turn (L gaze), provokes 6/10 dizziness in 5 reps   Dix-Hallpike Dix-Hallpike Right;Dix-Hallpike Left   Sidelying Test Sidelying Right;Sidelying Left   Horizontal Canal Testing Horizontal Canal Right;Horizontal Canal Left   Dix-Hallpike Right Duration --  >60 seconds described as "hard to focus" sensation   Dix-Hallpike Right Symptoms No nystagmus   Dix-Hallpike Left Duration Noted subjective symptoms 60 seconds describes a "hard to focus" sensation   Dix-Hallpike Left Symptoms No nystagmus   Sidelying Right Duration Noted subjective symtpoms approximately 60 seconds of "hard to focus" sensation   Sidelying Right Symptoms --  funny feeling in head, no nystagmus viewed   Sidelying Left Duration --  6/10 "swimmyheaded" sensation   Sidelying Left Symptoms --  No nystagmus, but dizziness worse with return to sit 7/10   Horizontal Canal Right Symptoms Normal   Horizontal Canal Left Symptoms Normal           PT Short Term Goals - 01/27/15 2141    PT SHORT TERM GOAL #1   Title The patient will return demo HEP for balance, mobility and habituation  independently.  Target date 02/24/2015   Time 4   Period Weeks   PT SHORT TERM GOAL #2   Title The patient will improve Berg score by > or equal to 6 points (to assess next visit).  Target date 02/24/2015   Time 4   Period Weeks   PT SHORT TERM GOAL #3   Title The patient will improve  gait speed from  2.29 ft/sec up to 2.6 ft/sec to demo improving functional ambulation.   Target date 02/24/2015   Time 4   Period Weeks   PT SHORT TERM GOAL #4   Title The patient will ambulate x 200 ft independently on level surfaces without loss of balance.  Target date 02/24/2015   Time 4   Period Weeks   PT SHORT TERM GOAL #5   Title The patient will move sit<>bilateral sidelying with dizziness < or equal to 3/10 (baseline 7/10).  Target date 02/24/2015.   Time 4   Period Weeks            PT Long Term Goals - 02/20/15 04-25-2145    PT LONG TERM GOAL #1   Title Verbalize understanding of fall prevention strategies.  Target date 03/29/2015   Time 8   Period Weeks   PT LONG TERM GOAL #2   Title The patient will improve Berg score by > or equal to 10 points from baseline.  Target date 03/29/2015   Time 8   Period Weeks   PT LONG TERM GOAL #3   Title The patient will improve gait speed from 2.29 ft/sec to > or equal to 3.0 ft/sec to demo increasing functioanl mobility.  Target date 03/29/2015   Time 8   Period Weeks   PT LONG TERM GOAL #4   Title The patient will negotiate level community surfaces without loss of balance x 800 ft nonstop.  Target date 03/29/2015   Time 8   Period Weeks   PT LONG TERM GOAL #5   Title Reduce DHI from 46% to < or equal to 28% to demo decreased subjective perception of vertigo.  Target date 03/29/2015   Time 8   Period Weeks         Plan - 02/20/2015 04/25/2152    Clinical Impression Statement The patient is a 74 yo male presenting to outpatient rehab s/p CVA and recent diagnosis with BPPV.  At today's evaluation, he does not demonstrate nystagmus with positional testing, but does  experience general dizziness with most positional changes.  He has imbalance noted during ambulation and impaired mobility with multiple falls in the past few weeks.  PT to focus on patient safety, balance, mobility and will treat vertigo as indicated.   Pt will benefit from skilled therapeutic intervention in order to improve on the following deficits Abnormal gait;Decreased balance;Difficulty walking;Postural dysfunction;Decreased safety awareness;Decreased mobility;Other (comment)  dizziness   Rehab Potential Good   PT Frequency 2x / week   PT Duration 8 weeks   PT Treatment/Interventions Therapeutic activities;Patient/family education;Therapeutic exercise;Gait training;Balance training;Stair training;Neuromuscular re-education;Functional mobility training;Other (comment)  vestibular rehab   PT Next Visit Plan Complete Berg, Gait training; add HEP: standing balance, habituation as indicated for motion tolerance   Consulted and Agree with Plan of Care Patient           PT Education - 01/26/15 1435    Education provided Yes   Education Details Recommended patient f/u with MD re: driving (he reports he plans to return to driving) due to dizziness, visual changes.   Person(s) Educated Patient   Methods Explanation   Comprehension Verbalized understanding            G-Codes - 02-20-15 2157-04-25    Functional Assessment Tool Used Gait speed=2.29 ft/sec, min A with gait with R veering, 4 falls in past 2 months   Functional Limitation Mobility: Walking and moving around   Mobility: Walking  and Moving Around Current Status [phone]) At least 40 percent but less than 60 percent impaired, limited or restricted   Mobility: Walking and Moving Around Goal Status [phone]) At least 20 percent but less than 40 percent impaired, limited or restricted       Problem List Patient Active Problem List   Diagnosis Date Noted  . BPPV (benign paroxysmal positional vertigo)   . Depression   . CVA (cerebral  infarction) 12/24/2014  . Renal insufficiency 12/24/2014  . Stroke 12/23/2014  . Cerebral embolism with cerebral infarction 01/13/2013  . Automatic implantable cardioverter-defibrillator in situ 08/26/2012  . Chronic systolic heart failure 06/28/2012  . Dizziness 06/24/2012  . Claudication 04/15/2012  . Dyspnea 01/26/2012  . Abdominal aortic aneurysm 11/30/2011  . COPD (chronic obstructive pulmonary disease) 07/24/2011  . Long term current use of anticoagulant 02/21/2011  . CORONARY ATHEROSCLEROSIS NATIVE CORONARY ARTERY 10/12/2009  . ILIAC ARTERY ANEURYSM 10/12/2009  . COMPUTERIZED TOMOGRAPHY, CHEST, ABNORMAL 10/12/2009  . HYPERCHOLESTEROLEMIA, PURE 10/18/2008  . HYPERTENSION, BENIGN 10/18/2008  . Cardiomyopathy, ischemic 10/18/2008  . MITRAL STENOSIS/ INSUFFICIENCY, NON-RHEUMATIC 10/18/2008  . Atrial fibrillation 10/18/2008  . CAROTID ARTERY STENOSIS, WITHOUT INFARCTION 10/18/2008    Thank you for the referral of this patient.   Fauna Neuner, PT 01/27/2015, 2:57 PM  [facility] [address] [place], [place], [address] Phone: [phone]   Fax:  [phone]

## 2015-02-01 ENCOUNTER — Encounter: Payer: Self-pay | Admitting: Physical Therapy

## 2015-02-01 ENCOUNTER — Ambulatory Visit: Payer: Medicare Other | Admitting: Physical Therapy

## 2015-02-01 DIAGNOSIS — H8110 Benign paroxysmal vertigo, unspecified ear: Secondary | ICD-10-CM | POA: Diagnosis not present

## 2015-02-01 DIAGNOSIS — R269 Unspecified abnormalities of gait and mobility: Secondary | ICD-10-CM

## 2015-02-01 NOTE — Patient Instructions (Addendum)
Gaze Stabilization: Tip Card 1.Target must remain in focus, not blurry, and appear stationary while head is in motion. 2.Perform exercises with small head movements (45 to either side of midline). 3.Increase speed of head motion so long as target is in focus. 4.If you wear eyeglasses, be sure you can see target through lens (therapist will give specific instructions for bifocal / progressive lenses). 5.These exercises may provoke dizziness or nausea. Work through these symptoms. If too dizzy, slow head movement slightly. Rest between each exercise. 6.Exercises demand concentration; avoid distractions. 7.For safety, perform standing exercises close to a counter, wall, corner, or next to someone.  Copyright  VHI. All rights reserved.  Gaze Stabilization: Standing Feet Apart   Feet shoulder width apart, keeping eyes on target on wall __6-8__ feet away, tilt head down 15-30 and move head side to side for _60___ seconds. Repeat while moving head up and down for __60__ seconds. Do __3-5__ sessions per day. Repeat using target on pattern background.  Copyright  VHI. All rights reserved.  Gaze Stabilization: Tip Card 1.Target must remain in focus, not blurry, and appear stationary while head is in motion. 2.Perform exercises with small head movements (45 to either side of midline). 3.Increase speed of head motion so long as target is in focus. 4.If you wear eyeglasses, be sure you can see target through lens (therapist will give specific instructions for bifocal / progressive lenses). 5.These exercises may provoke dizziness or nausea. Work through these symptoms. If too dizzy, slow head movement slightly. Rest between each exercise. 6.Exercises demand concentration; avoid distractions. 7.For safety, perform standing exercises close to a counter, wall, corner, or next to someone.  Copyright  VHI. All rights reserved.  Feet Apart, Head Motion - Eyes Closed   With eyes closed and feet  shoulder width apart, move head slowly, up and down. Repeat __2__ times per session. Do __2__ sessions per day.  Copyright  VHI. All rights reserved.  SINGLE LIMB STANCE   Stance: single leg on floor. Raise leg. Hold _10__ seconds. Repeat with other leg. __1 reps per set, 1__ sets per day, __7_ days per week  Copyright  VHI. All rights reserved.

## 2015-02-01 NOTE — Therapy (Signed)
[facility] [address] [place], [place], [address] Phone: [phone]   Fax:  [phone]  Physical Therapy Treatment  Patient Details  Name: [name] [name] MRN: [id number] Date of Birth: May 14, 1941 Referring Provider:  [name], MD  Encounter Date: 02/01/2015      PT End of Session - 02/01/15 1644    Visit Number 2  G2   Number of Visits 16   Date for PT Re-Evaluation 03/27/15   PT Start Time 0802   PT Stop Time 0848   PT Time Calculation (min) 46 min      Past Medical History  Diagnosis Date  . Atrial fibrillation     s/p prior DCCV;  Amiodarone Rx.  . CAD (coronary artery disease)     s/p CABG;  cath 10/07:  LM ok, LAD occluded, AV CFX 95%, pRCA occluded; L-LAD, S-OM2/OM3, S-PDA ok  . Cerebrovascular disease     s/p prior Left CEA;  followed by Dr. [name]  . COPD (chronic obstructive pulmonary disease)   . Hyperlipidemia   . Hypertension   . Mitral insufficiency     hx of. not noted on echo 01/2012  . Nephrolithiasis   . Ischemic cardiomyopathy     echo 5/09: EF 55%, mild LAE;  Myoview 11/10: inf and apical scar with mild peri-infarct ischemia, EF 44%. Echo 01/26/12 with EF 20-25% but previously known to be 30% on echoes before last one  . Iliac artery aneurysm, left     followed by Dr. [name]  . Aortic dissection     H/O focal aortic dissection  . LBBB (left bundle branch block)   . CHF (congestive heart failure)   . Myocardial infarction   . Renal insufficiency     01/2012  . Stroke     Past Surgical History  Procedure Laterality Date  . Uteroscopy    . Coronary artery bypass graft  1998  . Left cea  07/05  . Appendectomy  09/11/11  . Carotid endarterectomy  2005    left  . Biventricular defibrillator implantation  08/22/12    SJM Quadra Assura BiV ICD implanted by Dr [name]  . Spine surgery  Sep 27, 2014    Lower Back L 4  and   L 5  . Bi-ventricular implantable cardioverter  defibrillator N/A 08/22/2012    Procedure: BI-VENTRICULAR IMPLANTABLE CARDIOVERTER DEFIBRILLATOR  (CRT-D);  Surgeon: [name], MD;  Location: [facility] CATH LAB;  Service: Cardiovascular;  Laterality: N/A;    There were no vitals taken for this visit.  Visit Diagnosis:  Abnormality of gait      Subjective Assessment - 02/01/15 1635    Symptoms Pt. rates dizziness 5-6/10; states it is not true room spinning vertigo but more "swimmy-headedness" sensation   Patient Stated Goals Reduce dizziness, move better.   Currently in Pain? No/denies                    [facility] Adult PT Treatment/Exercise - 02/01/15 0812    Berg Balance Test   Sit to Stand Able to stand without using hands and stabilize independently   Standing Unsupported Able to stand safely 2 minutes   Sitting with Back Unsupported but Feet Supported on Floor or Stool Able to sit safely and securely 2 minutes   Stand to Sit Sits safely with minimal use of hands   Transfers Able to transfer safely, minor use of hands   Standing Unsupported with Eyes Closed  Able to stand 10 seconds with supervision   Standing Ubsupported with Feet Together Able to place feet together independently and stand for 1 minute with supervision   From Standing, Reach Forward with Outstretched Arm Can reach confidently >25 cm (10")   From Standing Position, Pick up Object from Floor Able to pick up shoe, needs supervision   From Standing Position, Turn to Look Behind Over each Shoulder Looks behind from both sides and weight shifts well   Turn 360 Degrees Needs close supervision or verbal cueing   Standing Unsupported, Alternately Place Feet on Step/Stool Able to stand independently and safely and complete 8 steps in 20 seconds  14.4 secs   Standing Unsupported, One Foot in Front Able to plae foot ahead of the other independently and hold 30 seconds   Standing on One Leg Tries to lift leg/unable to hold 3 seconds but remains standing independently    Total Score 46     Neuro Re-ed:  Dynamic visual acuity test - 2 line difference but pt. Reported incr. Vertigo after testing completed and demonstrated Some unsteadiness with static standing Pt. Performed gaze stabilization exercise in standing -horizontal x approx. 15 secs and vertical approx,. 10 secs  Performed standing balance exercise in corner with EC - with no head turns and with EO with head turns x 5 reps  Performed single limb stance exercise with UE support for 10 secs  At counter - instructed in these exercises for HEP          PT Education - 02/01/15 1643    Education provided Yes   Education Details standing on floor with EC and with EO with head turns; also single limb stance   Person(s) Educated Patient   Methods Explanation;Demonstration;Handout   Comprehension Verbalized understanding;Returned demonstration          PT Short Term Goals - 01/27/15 2141    PT SHORT TERM GOAL #1   Title The patient will return demo HEP for balance, mobility and habituation independently.  Target date 02/24/2015   Time 4   Period Weeks   PT SHORT TERM GOAL #2   Title The patient will improve Berg score by > or equal to 6 points (to assess next visit).  Target date 02/24/2015   Time 4   Period Weeks   PT SHORT TERM GOAL #3   Title The patient will improve gait speed from  2.29 ft/sec up to 2.6 ft/sec to demo improving functional ambulation.   Target date 02/24/2015   Time 4   Period Weeks   PT SHORT TERM GOAL #4   Title The patient will ambulate x 200 ft independently on level surfaces without loss of balance.  Target date 02/24/2015   Time 4   Period Weeks   PT SHORT TERM GOAL #5   Title The patient will move sit<>bilateral sidelying with dizziness < or equal to 3/10 (baseline 7/10).  Target date 02/24/2015.   Time 4   Period Weeks           PT Long Term Goals - 01/27/15 2146    PT LONG TERM GOAL #1   Title Verbalize understanding of fall prevention strategies.  Target  date 03/29/2015   Time 8   Period Weeks   PT LONG TERM GOAL #2   Title The patient will improve Berg score by > or equal to 10 points from baseline.  Target date 03/29/2015   Time 8   Period Weeks   PT  LONG TERM GOAL #3   Title The patient will improve gait speed from 2.29 ft/sec to > or equal to 3.0 ft/sec to demo increasing functioanl mobility.  Target date 03/29/2015   Time 8   Period Weeks   PT LONG TERM GOAL #4   Title The patient will negotiate level community surfaces without loss of balance x 800 ft nonstop.  Target date 03/29/2015   Time 8   Period Weeks   PT LONG TERM GOAL #5   Title Reduce DHI from 46% to < or equal to 28% to demo decreased subjective perception of vertigo.  Target date 03/29/2015   Time 8   Period Weeks               Plan - 02/01/15 1645    Clinical Impression Statement Pt. has vertigo symptoms consistent with central vertigo etiology; DVA tested WNL's, however, testing provoked vertigo and pt. had slight unsteadiness when test completed; balance problems appear to be greater with dynamic standing than with static standing         Pt will benefit from skilled therapeutic intervention in order to improve on the following deficits Abnormal gait;Decreased balance;Difficulty walking;Postural dysfunction;Decreased safety awareness;Decreased mobility;Other (comment)   Rehab Potential Good   PT Frequency 2x / week   PT Duration 8 weeks   PT Treatment/Interventions Therapeutic activities;Patient/family education;Therapeutic exercise;Gait training;Balance training;Stair training;Neuromuscular re-education;Functional mobility training;Other (comment)   PT Next Visit Plan check HEP and progress as able: do SOT?; gait with dynamic balance activities   PT Home Exercise Plan standing on floor with EC and with EO with head turns; single limb stance   Consulted and Agree with Plan of Care Patient        Problem List Patient Active Problem List   Diagnosis Date  Noted  . BPPV (benign paroxysmal positional vertigo)   . Depression   . CVA (cerebral infarction) 12/24/2014  . Renal insufficiency 12/24/2014  . Stroke 12/23/2014  . Cerebral embolism with cerebral infarction 01/13/2013  . Automatic implantable cardioverter-defibrillator in situ 08/26/2012  . Chronic systolic heart failure 06/28/2012  . Dizziness 06/24/2012  . Claudication 04/15/2012  . Dyspnea 01/26/2012  . Abdominal aortic aneurysm 11/30/2011  . COPD (chronic obstructive pulmonary disease) 07/24/2011  . Long term current use of anticoagulant 02/21/2011  . CORONARY ATHEROSCLEROSIS NATIVE CORONARY ARTERY 10/12/2009  . ILIAC ARTERY ANEURYSM 10/12/2009  . COMPUTERIZED TOMOGRAPHY, CHEST, ABNORMAL 10/12/2009  . HYPERCHOLESTEROLEMIA, PURE 10/18/2008  . HYPERTENSION, BENIGN 10/18/2008  . Cardiomyopathy, ischemic 10/18/2008  . MITRAL STENOSIS/ INSUFFICIENCY, NON-RHEUMATIC 10/18/2008  . Atrial fibrillation 10/18/2008  . CAROTID ARTERY STENOSIS, WITHOUT INFARCTION 10/18/2008    Charell Faulk, Donavan Burnet, PT 02/01/2015, 4:53 PM  [facility] [address] [place], [place], [address] Phone: [phone]   Fax:  [phone]

## 2015-02-03 ENCOUNTER — Ambulatory Visit (INDEPENDENT_AMBULATORY_CARE_PROVIDER_SITE_OTHER): Payer: Medicare Other | Admitting: *Deleted

## 2015-02-03 DIAGNOSIS — I255 Ischemic cardiomyopathy: Secondary | ICD-10-CM

## 2015-02-03 DIAGNOSIS — I5022 Chronic systolic (congestive) heart failure: Secondary | ICD-10-CM

## 2015-02-03 NOTE — Progress Notes (Signed)
Remote ICD transmission.   

## 2015-02-04 LAB — MDC_IDC_ENUM_SESS_TYPE_REMOTE
Battery Remaining Longevity: 57 mo
Battery Remaining Percentage: 66 %
Battery Voltage: 2.96 V
Brady Statistic AP VP Percent: 81 %
Brady Statistic AP VS Percent: 1 %
Brady Statistic AS VP Percent: 17 %
Brady Statistic RA Percent Paced: 82 %
Brady Statistic RV Percent Paced: 98 %
HIGH POWER IMPEDANCE MEASURED VALUE: 84 Ohm
HighPow Impedance: 84 Ohm
Implantable Pulse Generator Model: 3265
Lead Channel Impedance Value: 430 Ohm
Lead Channel Impedance Value: 490 Ohm
Lead Channel Pacing Threshold Amplitude: 0.625 V
Lead Channel Pacing Threshold Amplitude: 0.875 V
Lead Channel Pacing Threshold Amplitude: 0.875 V
Lead Channel Pacing Threshold Pulse Width: 0.5 ms
Lead Channel Pacing Threshold Pulse Width: 0.5 ms
Lead Channel Sensing Intrinsic Amplitude: 12 mV
Lead Channel Sensing Intrinsic Amplitude: 5 mV
Lead Channel Setting Pacing Amplitude: 1.875
Lead Channel Setting Pacing Amplitude: 2 V
Lead Channel Setting Pacing Pulse Width: 0.5 ms
Lead Channel Setting Sensing Sensitivity: 0.5 mV
MDC IDC MSMT LEADCHNL RA IMPEDANCE VALUE: 460 Ohm
MDC IDC MSMT LEADCHNL RA PACING THRESHOLD PULSEWIDTH: 0.5 ms
MDC IDC PG SERIAL: 1075181
MDC IDC SESS DTM: 20160224070024
MDC IDC SET LEADCHNL RV PACING AMPLITUDE: 2 V
MDC IDC SET LEADCHNL RV PACING PULSEWIDTH: 0.5 ms
MDC IDC STAT BRADY AS VS PERCENT: 1 %
Zone Setting Detection Interval: 280 ms
Zone Setting Detection Interval: 340 ms

## 2015-02-04 NOTE — Progress Notes (Signed)
HPI: FU CAD. He has a history of coronary artery disease, status post coronary artery bypassing graft, history of ischemic cardiomyopathy, PAF as well as COPD. His last catheterization in August of 2013 showed normal left main, occluded LAD, a small OM 2 with an ostial 95% lesion, 2 moderate size obtuse marginals were occluded. The right coronary was occluded. The LIMA to the LAD was patent. The saphenous vein graft to the diagonal had a 50% proximal stenosis. The saphenous vein graft to the obtuse marginal was patent. Saphenous vein graft to the right coronary artery had a mid 40% lesion. Ejection fraction was 35%. Patient subsequently had a biventricular ICD implanted in September of 2013. He also has a history of focal aortic dissection being managed medically. Note, he also has a left iliac artery aneurysm and mild cerebrovascular disease with history of carotid endarterectomy. He is followed by vascular surgery (Dr. [name]) for these issues. Had CVA January 2016. Last echocardiogram in January 2016 showed an ejection fraction of 40-45%.there was grade 1 diastolic dysfunction and moderate left atrial enlargement. Recurrent event February 2016. Since he was last seen, he denies dyspnea, chest pain, palpitations or syncope.  Current Outpatient Prescriptions  Medication Sig Dispense Refill  . albuterol (PROVENTIL HFA;VENTOLIN HFA) 108 (90 BASE) MCG/ACT inhaler Inhale 2 puffs into the lungs every 4 (four) hours as needed for wheezing. 1 Inhaler 0  . amiodarone (PACERONE) 200 MG tablet TAKE 1 TABLET EVERY DAY (Patient taking differently: TAKE 1 TABLET BY MOUTH EVERY DAY) 90 tablet 2  . amLODipine (NORVASC) 10 MG tablet Take 1 tablet (10 mg total) by mouth daily. 90 tablet 4  . citalopram (CELEXA) 10 MG tablet Take 1 tablet (10 mg total) by mouth daily. 30 tablet 1  . dabigatran (PRADAXA) 150 MG CAPS capsule Take 1 capsule (150 mg total) by mouth 2 (two) times daily. 180 capsule 1  . docusate sodium  (COLACE) 100 MG capsule Take 1 capsule (100 mg total) by mouth every 12 (twelve) hours. 60 capsule 0  . furosemide (LASIX) 40 MG tablet Take 40 mg by mouth daily as needed for fluid or edema.     . hydrALAZINE (APRESOLINE) 50 MG tablet Take 0.5 tablets (25 mg total) by mouth 3 (three) times daily. 135 tablet 3  . ipratropium-albuterol (DUONEB) 0.5-2.5 (3) MG/3ML SOLN Take 3 mLs by nebulization every 4 (four) hours as needed (for shortness of breath).    [name] lisinopril (PRINIVIL,ZESTRIL) 40 MG tablet TAKE 1 TABLET EVERY DAY 90 tablet 3  . metoprolol succinate (TOPROL-XL) 25 MG 24 hr tablet Take 1 tablet (25 mg total) by mouth daily. (Patient taking differently: Take 12.5 mg by mouth daily. ) 45 tablet 3  . Multiple Vitamin (MULITIVITAMIN WITH MINERALS) TABS Take 1 tablet by mouth daily.    . naproxen sodium (ALEVE) 220 MG tablet Take 440 mg by mouth 2 (two) times daily as needed (for pain).    . niacin (NIASPAN) 500 MG CR tablet Take 1,000 mg by mouth at bedtime.     . potassium chloride SA (K-DUR,KLOR-CON) 20 MEQ tablet TAKE 2 TABLETS THREE TIMES DAILY 540 tablet 0  . pravastatin (PRAVACHOL) 80 MG tablet Take 1 tablet (80 mg total) by mouth daily. 90 tablet 2   No current facility-administered medications for this visit.     Past Medical History  Diagnosis Date  . Atrial fibrillation     s/p prior DCCV;  Amiodarone Rx.  . CAD (coronary artery disease)  s/p CABG;  cath 10/07:  LM ok, LAD occluded, AV CFX 95%, pRCA occluded; L-LAD, S-OM2/OM3, S-PDA ok  . Cerebrovascular disease     s/p prior Left CEA;  followed by Dr. [name]  . COPD (chronic obstructive pulmonary disease)   . Hyperlipidemia   . Hypertension   . Mitral insufficiency     hx of. not noted on echo 01/2012  . Nephrolithiasis   . Ischemic cardiomyopathy     echo 5/09: EF 55%, mild LAE;  Myoview 11/10: inf and apical scar with mild peri-infarct ischemia, EF 44%. Echo 01/26/12 with EF 20-25% but previously known to be 30% on  echoes before last one  . Iliac artery aneurysm, left     followed by Dr. [name]  . Aortic dissection     H/O focal aortic dissection  . LBBB (left bundle branch block)   . CHF (congestive heart failure)   . Myocardial infarction   . Renal insufficiency     01/2012  . Stroke   . CVA (cerebral infarction)     Past Surgical History  Procedure Laterality Date  . Uteroscopy    . Coronary artery bypass graft  1998  . Left cea  07/05  . Appendectomy  09/11/11  . Carotid endarterectomy  2005    left  . Biventricular defibrillator implantation  08/22/12    SJM Quadra Assura BiV ICD implanted by Dr [name]  . Spine surgery  Sep 27, 2014    Lower Back L 4  and   L 5  . Bi-ventricular implantable cardioverter defibrillator N/A 08/22/2012    Procedure: BI-VENTRICULAR IMPLANTABLE CARDIOVERTER DEFIBRILLATOR  (CRT-D);  Surgeon: [name], MD;  Location: [facility] CATH LAB;  Service: Cardiovascular;  Laterality: N/A;    History   Social History  . Marital Status: Married    Spouse Name: N/A  . Number of Children: 3  . Years of Education: N/A   Occupational History  . RETIRED---truck driver    Social History Main Topics  . Smoking status: Former Smoker -- 1.50 packs/day for 57 years    Types: Cigarettes    Quit date: 12/11/2004  . Smokeless tobacco: Never Used  . Alcohol Use: No     Comment: 6 yrs ago   . Drug Use: No  . Sexual Activity: No   Other Topics Concern  . Not on file   Social History Narrative   Lives in [place], [place] with wife.    Caffeine Use: Does drink coffee    ROS: no fevers or chills, productive cough, hemoptysis, dysphasia, odynophagia, melena, hematochezia, dysuria, hematuria, rash, seizure activity, orthopnea, PND, pedal edema, claudication. Remaining systems are negative.  Physical Exam: Well-developed well-nourished in no acute distress.  Skin is warm and dry.  HEENT is normal.  Neck is supple.  Chest is clear to auscultation with normal expansion.    Cardiovascular exam is regular rate and rhythm.  Abdominal exam nontender or distended. No masses palpated. Extremities show no edema. neuro grossly intact

## 2015-02-05 ENCOUNTER — Ambulatory Visit (INDEPENDENT_AMBULATORY_CARE_PROVIDER_SITE_OTHER): Payer: Medicare Other | Admitting: Cardiology

## 2015-02-05 ENCOUNTER — Encounter: Payer: Self-pay | Admitting: Cardiology

## 2015-02-05 VITALS — BP 130/78 | HR 68 | Ht 73.0 in | Wt 243.1 lb

## 2015-02-05 DIAGNOSIS — I6340 Cerebral infarction due to embolism of unspecified cerebral artery: Secondary | ICD-10-CM

## 2015-02-05 DIAGNOSIS — I10 Essential (primary) hypertension: Secondary | ICD-10-CM

## 2015-02-05 DIAGNOSIS — I2510 Atherosclerotic heart disease of native coronary artery without angina pectoris: Secondary | ICD-10-CM

## 2015-02-05 DIAGNOSIS — I255 Ischemic cardiomyopathy: Secondary | ICD-10-CM

## 2015-02-05 DIAGNOSIS — I480 Paroxysmal atrial fibrillation: Secondary | ICD-10-CM

## 2015-02-05 DIAGNOSIS — I5022 Chronic systolic (congestive) heart failure: Secondary | ICD-10-CM

## 2015-02-05 DIAGNOSIS — I723 Aneurysm of iliac artery: Secondary | ICD-10-CM

## 2015-02-05 DIAGNOSIS — Z95810 Presence of automatic (implantable) cardiac defibrillator: Secondary | ICD-10-CM

## 2015-02-05 LAB — BASIC METABOLIC PANEL WITH GFR
BUN: 18 mg/dL (ref 6–23)
CO2: 23 mEq/L (ref 19–32)
Calcium: 8.9 mg/dL (ref 8.4–10.5)
Chloride: 106 mEq/L (ref 96–112)
Creat: 1.24 mg/dL (ref 0.50–1.35)
GFR, EST NON AFRICAN AMERICAN: 58 mL/min — AB
GFR, Est African American: 67 mL/min
GLUCOSE: 93 mg/dL (ref 70–99)
Potassium: 4.2 mEq/L (ref 3.5–5.3)
SODIUM: 143 meq/L (ref 135–145)

## 2015-02-05 LAB — TSH: TSH: 0.62 u[IU]/mL (ref 0.350–4.500)

## 2015-02-05 LAB — CBC
HCT: 48.4 % (ref 39.0–52.0)
HEMOGLOBIN: 16.4 g/dL (ref 13.0–17.0)
MCH: 31 pg (ref 26.0–34.0)
MCHC: 33.9 g/dL (ref 30.0–36.0)
MCV: 91.5 fL (ref 78.0–100.0)
MPV: 10.1 fL (ref 8.6–12.4)
PLATELETS: 179 10*3/uL (ref 150–400)
RBC: 5.29 MIL/uL (ref 4.22–5.81)
RDW: 14.7 % (ref 11.5–15.5)
WBC: 11.2 10*3/uL — ABNORMAL HIGH (ref 4.0–10.5)

## 2015-02-05 NOTE — Assessment & Plan Note (Signed)
Continue statin. 

## 2015-02-05 NOTE — Assessment & Plan Note (Signed)
Followed by electrophysiology. 

## 2015-02-05 NOTE — Assessment & Plan Note (Signed)
Followed by vascular surgery. Continue statin. 

## 2015-02-05 NOTE — Assessment & Plan Note (Signed)
Continue present blood pressure medications. 

## 2015-02-05 NOTE — Assessment & Plan Note (Signed)
Followed by vascular surgery. 

## 2015-02-05 NOTE — Assessment & Plan Note (Signed)
Continue ARB and beta blocker. 

## 2015-02-05 NOTE — Assessment & Plan Note (Signed)
Continue amiodarone. Recent liver functions and chest x-ray noted. Check TSH. Continue pradaxa. Check hemoglobin and renal function.

## 2015-02-05 NOTE — Patient Instructions (Signed)
Your physician wants you to follow-up in: 6 MONTHS WITH DR CRENSHAW You will receive a reminder letter in the mail two months in advance. If you don't receive a letter, please call our office to schedule the follow-up appointment.   Your physician recommends that you HAVE LAB WORK TODAY 

## 2015-02-05 NOTE — Assessment & Plan Note (Signed)
Continue present dose of diuretics. Check potassium and renal function. 

## 2015-02-08 ENCOUNTER — Encounter: Payer: Self-pay | Admitting: Cardiology

## 2015-02-09 ENCOUNTER — Encounter: Payer: Self-pay | Admitting: Physical Therapy

## 2015-02-09 ENCOUNTER — Ambulatory Visit: Payer: Medicare Other | Attending: Internal Medicine | Admitting: Physical Therapy

## 2015-02-09 DIAGNOSIS — I340 Nonrheumatic mitral (valve) insufficiency: Secondary | ICD-10-CM | POA: Diagnosis not present

## 2015-02-09 DIAGNOSIS — J449 Chronic obstructive pulmonary disease, unspecified: Secondary | ICD-10-CM | POA: Diagnosis not present

## 2015-02-09 DIAGNOSIS — I639 Cerebral infarction, unspecified: Secondary | ICD-10-CM | POA: Insufficient documentation

## 2015-02-09 DIAGNOSIS — R269 Unspecified abnormalities of gait and mobility: Secondary | ICD-10-CM

## 2015-02-09 DIAGNOSIS — H8110 Benign paroxysmal vertigo, unspecified ear: Secondary | ICD-10-CM | POA: Insufficient documentation

## 2015-02-09 DIAGNOSIS — Z95810 Presence of automatic (implantable) cardiac defibrillator: Secondary | ICD-10-CM | POA: Insufficient documentation

## 2015-02-09 DIAGNOSIS — E780 Pure hypercholesterolemia: Secondary | ICD-10-CM | POA: Insufficient documentation

## 2015-02-09 DIAGNOSIS — I10 Essential (primary) hypertension: Secondary | ICD-10-CM | POA: Diagnosis not present

## 2015-02-09 DIAGNOSIS — N289 Disorder of kidney and ureter, unspecified: Secondary | ICD-10-CM | POA: Diagnosis not present

## 2015-02-09 DIAGNOSIS — I2510 Atherosclerotic heart disease of native coronary artery without angina pectoris: Secondary | ICD-10-CM | POA: Insufficient documentation

## 2015-02-09 DIAGNOSIS — Z951 Presence of aortocoronary bypass graft: Secondary | ICD-10-CM | POA: Insufficient documentation

## 2015-02-09 DIAGNOSIS — I4891 Unspecified atrial fibrillation: Secondary | ICD-10-CM | POA: Insufficient documentation

## 2015-02-09 DIAGNOSIS — I5022 Chronic systolic (congestive) heart failure: Secondary | ICD-10-CM | POA: Diagnosis not present

## 2015-02-09 NOTE — Therapy (Signed)
[facility] [address] [place], [place], [address] Phone: [phone]   Fax:  [phone]  Physical Therapy Treatment  Patient Details  Name: [name] [name] MRN: [id number] Date of Birth: 22-Nov-1941 Referring Provider:  [name], MD  Encounter Date: 02/09/2015      PT End of Session - 02/09/15 1712    Visit Number 3  G3   Number of Visits 16   Date for PT Re-Evaluation 03/27/15   Authorization Type Medicare   PT Start Time 0931   PT Stop Time 1015   PT Time Calculation (min) 44 min      Past Medical History  Diagnosis Date  . Atrial fibrillation     s/p prior DCCV;  Amiodarone Rx.  . CAD (coronary artery disease)     s/p CABG;  cath 10/07:  LM ok, LAD occluded, AV CFX 95%, pRCA occluded; L-LAD, S-OM2/OM3, S-PDA ok  . Cerebrovascular disease     s/p prior Left CEA;  followed by Dr. [name]  . COPD (chronic obstructive pulmonary disease)   . Hyperlipidemia   . Hypertension   . Mitral insufficiency     hx of. not noted on echo 01/2012  . Nephrolithiasis   . Ischemic cardiomyopathy     echo 5/09: EF 55%, mild LAE;  Myoview 11/10: inf and apical scar with mild peri-infarct ischemia, EF 44%. Echo 01/26/12 with EF 20-25% but previously known to be 30% on echoes before last one  . Iliac artery aneurysm, left     followed by Dr. [name]  . Aortic dissection     H/O focal aortic dissection  . LBBB (left bundle branch block)   . CHF (congestive heart failure)   . Myocardial infarction   . Renal insufficiency     01/2012  . Stroke   . CVA (cerebral infarction)     Past Surgical History  Procedure Laterality Date  . Uteroscopy    . Coronary artery bypass graft  1998  . Left cea  07/05  . Appendectomy  09/11/11  . Carotid endarterectomy  2005    left  . Biventricular defibrillator implantation  08/22/12    SJM Quadra Assura BiV ICD implanted by Dr [name]  . Spine surgery  Sep 27, 2014    Lower Back L  4  and   L 5  . Bi-ventricular implantable cardioverter defibrillator N/A 08/22/2012    Procedure: BI-VENTRICULAR IMPLANTABLE CARDIOVERTER DEFIBRILLATOR  (CRT-D);  Surgeon: [name], MD;  Location: [facility] CATH LAB;  Service: Cardiovascular;  Laterality: N/A;    There were no vitals taken for this visit.  Visit Diagnosis:  Abnormality of gait      Subjective Assessment - 02/09/15 1657    Symptoms Pt. states vertigo is much improved from what it was initially; states he went to beach last weekend and did fine with mobility; states he has just a little bit of vertigo but not very much   Patient Stated Goals Reduce dizziness, move better.   Currently in Pain? No/denies                    [facility] Adult PT Treatment/Exercise - 02/09/15 1704    Dynamic Gait Index   Gait with Horizontal Head Turns Normal   Gait with Vertical Head Turns Mild Impairment   Gait and Pivot Turn Mild Impairment   High Level Balance   High Level Balance Activities Sudden stops;Head turns;Backward walking;Side stepping   High  Level Balance Comments pt. reported minimal dizziness with balance activities      NeuroRe-ed:  Vestibular/balance exercises - standing on foam in corner with EO and EC with feet apart and feet together  with CGA:  Touching walls with opposite UE's x 5 reps each; marching on incline/decline with head turns and with  EC with min to occasional mod assist:  Crossovers front and back on blue mat with min assist to improve single limb stance Rockerboard with EO and EC with UE support prn with min assist inside bars; also with head turns Dynamic visual acuity test 3 line difference today but no unsteadiness after testing stopped and ptl reports vertigo is much less Intensity than it was when completed in previous PT session Sit to stand on mat with alternate pivot turns to R and L sides x 5 reps; turning 180 degrees x 2 reps on mat   Pt. Reported some mild increased vertigo at end of  session but states he is doing very well and feels he is almost ready for D/C         PT Education - 02/09/15 1711    Education provided Yes   Person(s) Educated Patient   Methods Explanation   Comprehension Verbalized understanding          PT Short Term Goals - 01/27/15 2141    PT SHORT TERM GOAL #1   Title The patient will return demo HEP for balance, mobility and habituation independently.  Target date 02/24/2015   Time 4   Period Weeks   PT SHORT TERM GOAL #2   Title The patient will improve Berg score by > or equal to 6 points (to assess next visit).  Target date 02/24/2015   Time 4   Period Weeks   PT SHORT TERM GOAL #3   Title The patient will improve gait speed from  2.29 ft/sec up to 2.6 ft/sec to demo improving functional ambulation.   Target date 02/24/2015   Time 4   Period Weeks   PT SHORT TERM GOAL #4   Title The patient will ambulate x 200 ft independently on level surfaces without loss of balance.  Target date 02/24/2015   Time 4   Period Weeks   PT SHORT TERM GOAL #5   Title The patient will move sit<>bilateral sidelying with dizziness < or equal to 3/10 (baseline 7/10).  Target date 02/24/2015.   Time 4   Period Weeks           PT Long Term Goals - 01/27/15 2146    PT LONG TERM GOAL #1   Title Verbalize understanding of fall prevention strategies.  Target date 03/29/2015   Time 8   Period Weeks   PT LONG TERM GOAL #2   Title The patient will improve Berg score by > or equal to 10 points from baseline.  Target date 03/29/2015   Time 8   Period Weeks   PT LONG TERM GOAL #3   Title The patient will improve gait speed from 2.29 ft/sec to > or equal to 3.0 ft/sec to demo increasing functioanl mobility.  Target date 03/29/2015   Time 8   Period Weeks   PT LONG TERM GOAL #4   Title The patient will negotiate level community surfaces without loss of balance x 800 ft nonstop.  Target date 03/29/2015   Time 8   Period Weeks   PT LONG TERM GOAL #5   Title  Reduce DHI from 46% to <  or equal to 28% to demo decreased subjective perception of vertigo.  Target date 03/29/2015   Time 8   Period Weeks               Plan - 02/09/15 1713    Clinical Impression Statement Pt. had 3 line difference today with DVA testing (last week only a 2 line difference) but pt. reported much less vertigo intensity after testing completed and no unsteadiness noted after testing completed;    pt.'s balance much improved today and  pt. appears to be progressing much quicker than anticipated      Pt will benefit from skilled therapeutic intervention in order to improve on the following deficits Abnormal gait;Decreased balance;Difficulty walking;Postural dysfunction;Decreased safety awareness;Decreased mobility;Other (comment)   Rehab Potential Good   PT Frequency 2x / week   PT Duration 8 weeks   PT Treatment/Interventions Therapeutic activities;Patient/family education;Therapeutic exercise;Gait training;Balance training;Stair training;Neuromuscular re-education;Functional mobility training;Other (comment)   PT Next Visit Plan continue with vestibular/balance exercises   PT Home Exercise Plan standing on floor with EC and with EO with head turns; single limb stance   Consulted and Agree with Plan of Care Patient        Problem List Patient Active Problem List   Diagnosis Date Noted  . BPPV (benign paroxysmal positional vertigo)   . Depression   . CVA (cerebral infarction) 12/24/2014  . Renal insufficiency 12/24/2014  . Stroke 12/23/2014  . Cerebral embolism with cerebral infarction 01/13/2013  . Automatic implantable cardioverter-defibrillator in situ 08/26/2012  . Chronic systolic heart failure 06/28/2012  . Dizziness 06/24/2012  . Claudication 04/15/2012  . Dyspnea 01/26/2012  . Abdominal aortic aneurysm 11/30/2011  . COPD (chronic obstructive pulmonary disease) 07/24/2011  . Long term current use of anticoagulant 02/21/2011  . CORONARY  ATHEROSCLEROSIS NATIVE CORONARY ARTERY 10/12/2009  . ILIAC ARTERY ANEURYSM 10/12/2009  . COMPUTERIZED TOMOGRAPHY, CHEST, ABNORMAL 10/12/2009  . HYPERCHOLESTEROLEMIA, PURE 10/18/2008  . HYPERTENSION, BENIGN 10/18/2008  . Cardiomyopathy, ischemic 10/18/2008  . MITRAL STENOSIS/ INSUFFICIENCY, NON-RHEUMATIC 10/18/2008  . Atrial fibrillation 10/18/2008  . CAROTID ARTERY STENOSIS, WITHOUT INFARCTION 10/18/2008    Jaleeyah Munce, Donavan Burnet, PT 02/09/2015, 5:21 PM  [facility] [address] [place], [place], [address] Phone: [phone]   Fax:  [phone]

## 2015-02-12 ENCOUNTER — Ambulatory Visit: Payer: Medicare Other | Admitting: Rehabilitative and Restorative Service Providers"

## 2015-02-12 ENCOUNTER — Encounter: Payer: Self-pay | Admitting: Rehabilitative and Restorative Service Providers"

## 2015-02-12 ENCOUNTER — Telehealth: Payer: Self-pay | Admitting: Cardiology

## 2015-02-12 DIAGNOSIS — R269 Unspecified abnormalities of gait and mobility: Secondary | ICD-10-CM

## 2015-02-12 DIAGNOSIS — H8110 Benign paroxysmal vertigo, unspecified ear: Secondary | ICD-10-CM | POA: Diagnosis not present

## 2015-02-12 NOTE — Therapy (Signed)
[facility] [address] [place], [place], [address] Phone: [phone]   Fax:  [phone]  Patient Details  Name: [name] [name] MRN: [id number] Date of Birth: Apr 18, 1941 Referring Provider:  No ref. provider found  Encounter Date: 02/12/2015 PHYSICAL THERAPY DISCHARGE SUMMARY  Visits from Start of Care: 4  Current functional level related to goals / functional outcomes:     PT Long Term Goals - 02/12/15 0856    PT LONG TERM GOAL #1   Title Verbalize understanding of fall prevention strategies.  Target date 03/29/2015   Baseline provided information on 02/12/2015   Time 8   Period Weeks   Status Achieved   PT LONG TERM GOAL #2   Title The patient will improve Berg score by > or equal to 10 points from baseline.  Target date 03/29/2015   Baseline Pt's baseline was 46/56, and he improved to 52/56. Tandem and single limb stance still challenging.   Time 8   Period Weeks   Status Deferred   PT LONG TERM GOAL #3   Title The patient will improve gait speed from 2.29 ft/sec to > or equal to 3.0 ft/sec to demo increasing functioanl mobility.  Target date 03/29/2015   Time 8   Period Weeks   Status Achieved   PT LONG TERM GOAL #4   Title The patient will negotiate level community surfaces without loss of balance x 800 ft nonstop.  Target date 03/29/2015   Baseline Pt reports going to beach with family and negotiating beach and pier without assistance or loss of balance.   Time 8   Period Weeks   Status Achieved   PT LONG TERM GOAL #5   Title Reduce DHI from 46% to < or equal to 28% to demo decreased subjective perception of vertigo.  Target date 03/29/2015   Baseline Pt improved from 46% down to 10%.   Time 8   Period Weeks   Status Achieved        Remaining deficits: Patient reports return to prior level of function.   Education / Equipment: HEP, home safety, fall prevention.  Plan: Patient agrees to  discharge.  Patient goals were met. Patient is being discharged due to meeting the stated rehab goals.  ?????    Thank you for the referral of this patient.   [place], [place] 02/12/2015, 9:24 AM  [facility] [address] [place], [place], [address] Phone: [phone]   Fax:  [phone]

## 2015-02-12 NOTE — Therapy (Signed)
[facility] [address] [place], [place], [address] Phone: [phone]   Fax:  [phone]  Physical Therapy Treatment  Patient Details  Name: [name] [name] MRN: [id number] Date of Birth: 05-31-1941 Referring Provider:  Raina Mina., MD  Encounter Date: 02/12/2015      PT End of Session - 02/12/15 0907    Visit Number 4  G4   Number of Visits 16   Date for PT Re-Evaluation 03/27/15   Authorization Type Medicare   PT Start Time 0845   PT Stop Time 0910   PT Time Calculation (min) 25 min   Activity Tolerance Patient tolerated treatment well   Behavior During Therapy [facility] for tasks assessed/performed      Past Medical History  Diagnosis Date  . Atrial fibrillation     s/p prior DCCV;  Amiodarone Rx.  . CAD (coronary artery disease)     s/p CABG;  cath 10/07:  LM ok, LAD occluded, AV CFX 95%, pRCA occluded; L-LAD, S-OM2/OM3, S-PDA ok  . Cerebrovascular disease     s/p prior Left CEA;  followed by Dr. Donnetta Hutching  . COPD (chronic obstructive pulmonary disease)   . Hyperlipidemia   . Hypertension   . Mitral insufficiency     hx of. not noted on echo 01/2012  . Nephrolithiasis   . Ischemic cardiomyopathy     echo 5/09: EF 55%, mild LAE;  Myoview 11/10: inf and apical scar with mild peri-infarct ischemia, EF 44%. Echo 01/26/12 with EF 20-25% but previously known to be 30% on echoes before last one  . Iliac artery aneurysm, left     followed by Dr. Donnetta Hutching  . Aortic dissection     H/O focal aortic dissection  . LBBB (left bundle branch block)   . CHF (congestive heart failure)   . Myocardial infarction   . Renal insufficiency     01/2012  . Stroke   . CVA (cerebral infarction)     Past Surgical History  Procedure Laterality Date  . Uteroscopy    . Coronary artery bypass graft  1998  . Left cea  07/05  . Appendectomy  09/11/11  . Carotid endarterectomy  2005    left  . Biventricular defibrillator  implantation  08/22/12    SJM Quadra Assura BiV ICD implanted by Dr Rayann Heman  . Spine surgery  Sep 27, 2014    Lower Back L 4  and   L 5  . Bi-ventricular implantable cardioverter defibrillator N/A 08/22/2012    Procedure: BI-VENTRICULAR IMPLANTABLE CARDIOVERTER DEFIBRILLATOR  (CRT-D);  Surgeon: Thompson Grayer, MD;  Location: [facility] CATH LAB;  Service: Cardiovascular;  Laterality: N/A;    There were no vitals taken for this visit.  Visit Diagnosis:  Abnormality of gait      Subjective Assessment - 02/12/15 0846    Symptoms The patient reports he is doing much better.  He is a little swimmyheaded this morning due to not eating when taking his medications.   Currently in Pain? No/denies     NEUROMUSCULAR RE-EDUCATION:      [facility] PT Assessment - 02/12/15 0848    Ambulation/Gait   Ambulation/Gait Yes   Ambulation/Gait Assistance 7: Independent   Ambulation Distance (Feet) 200 Feet   Gait velocity 3.68 ft/sec   Standardized Balance Assessment   Standardized Balance Assessment Berg Balance Test   Berg Balance Test   Sit to Stand Able to stand without using hands and stabilize independently   Standing Unsupported  Able to stand safely 2 minutes   Sitting with Back Unsupported but Feet Supported on Floor or Stool Able to sit safely and securely 2 minutes   Stand to Sit Sits safely with minimal use of hands   Transfers Able to transfer safely, minor use of hands   Standing Unsupported with Eyes Closed Able to stand 10 seconds safely   Standing Ubsupported with Feet Together Able to place feet together independently and stand 1 minute safely   From Standing, Reach Forward with Outstretched Arm Can reach confidently >25 cm (10")   From Standing Position, Pick up Object from Floor Able to pick up shoe safely and easily   From Standing Position, Turn to Look Behind Over each Shoulder Looks behind from both sides and weight shifts well   Turn 360 Degrees Able to turn 360 degrees safely in 4 seconds  or less   Standing Unsupported, Alternately Place Feet on Step/Stool Able to stand independently and safely and complete 8 steps in 20 seconds   Standing Unsupported, One Foot in Front Able to take small step independently and hold 30 seconds   Standing on One Leg Able to lift leg independently and hold equal to or more than 3 seconds   Total Score 52      SELF CARE/HOME MANAGEMENT: Fall prevention handout and home safety information provided.          PT Education - 02/12/15 0906    Education provided Yes   Education Details Home safety/ fall prevention; recommendations to continue HEP program after discharge (especially tandem + single leg stance)   Person(s) Educated Patient   Methods Explanation;Handout   Comprehension Verbalized understanding          PT Short Term Goals - 02/12/15 0846    PT SHORT TERM GOAL #1   Title The patient will return demo HEP for balance, mobility and habituation independently.  Target date 02/24/2015   Baseline met on 02/12/2015   Time 4   Period Weeks   Status Achieved   PT SHORT TERM GOAL #2   Title The patient will improve Berg score by > or equal to 6 points (to assess next visit).  Target date 02/24/2015   Baseline Improved from 46/56 up to 52/56 on 02/12/2015.   Time 4   Period Weeks   Status Achieved   PT SHORT TERM GOAL #3   Title The patient will improve gait speed from  2.29 ft/sec up to 2.6 ft/sec to demo improving functional ambulation.   Target date 02/24/2015   Baseline Improved from 2.29 ft/sec up to 3.68 ft/sec on 02/12/2015.   Time 4   Period Weeks   Status Achieved   PT SHORT TERM GOAL #4   Title The patient will ambulate x 200 ft independently on level surfaces without loss of balance.  Target date 02/24/2015   Baseline met on 02/12/2015   Time 4   Period Weeks   Status Achieved   PT SHORT TERM GOAL #5   Title The patient will move sit<>bilateral sidelying with dizziness < or equal to 3/10 (baseline 7/10).  Target date  02/24/2015.   Baseline Met on 02/12/2015 with 0/10 dizziness.   Time 4   Period Weeks   Status Achieved           PT Long Term Goals - 02/12/15 1483    PT LONG TERM GOAL #1   Title Verbalize understanding of fall prevention strategies.  Target date 03/29/2015  Baseline provided information on 02/24/2015   Time 8   Period Weeks   Status Achieved   PT LONG TERM GOAL #2   Title The patient will improve Berg score by > or equal to 10 points from baseline.  Target date 03/29/2015   Baseline Pt's baseline was 46/56, and he improved to 52/56. Tandem and single limb stance still challenging.   Time 8   Period Weeks   Status Deferred   PT LONG TERM GOAL #3   Title The patient will improve gait speed from 2.29 ft/sec to > or equal to 3.0 ft/sec to demo increasing functioanl mobility.  Target date 03/29/2015   Time 8   Period Weeks   Status Achieved   PT LONG TERM GOAL #4   Title The patient will negotiate level community surfaces without loss of balance x 800 ft nonstop.  Target date 03/29/2015   Baseline Pt reports going to beach with family and negotiating beach and pier without assistance or loss of balance.   Time 8   Period Weeks   Status Achieved   PT LONG TERM GOAL #5   Title Reduce DHI from 46% to < or equal to 28% to demo decreased subjective perception of vertigo.  Target date 03/29/2015   Baseline Pt improved from 46% down to 10%.   Time 8   Period Weeks   Status Achieved               Plan - 02/24/2015 0908    Clinical Impression Statement The patient met all STGs and LTGs.  Patient feels he has returned to prior level of function and is willing to continue HEP post d/c.   PT Next Visit Plan Discharge due to meeting all STGs and LTGs.   Consulted and Agree with Plan of Care Patient          G-Codes - 02-24-2015 0908    Functional Assessment Tool Used Gait speed 3.68 ft/sec, indep with ambulation indoors/outdoor surfaces.   Functional Limitation Mobility: Walking and  moving around   Mobility: Walking and Moving Around Goal Status [phone]) At least 20 percent but less than 40 percent impaired, limited or restricted   Mobility: Walking and Moving Around Discharge Status [phone]) At least 1 percent but less than 20 percent impaired, limited or restricted      Problem List Patient Active Problem List   Diagnosis Date Noted  . BPPV (benign paroxysmal positional vertigo)   . Depression   . CVA (cerebral infarction) 12/24/2014  . Renal insufficiency 12/24/2014  . Stroke 12/23/2014  . Cerebral embolism with cerebral infarction 01/13/2013  . Automatic implantable cardioverter-defibrillator in situ 08/26/2012  . Chronic systolic heart failure 47/08/6282  . Dizziness 06/24/2012  . Claudication 04/15/2012  . Dyspnea 01/26/2012  . Abdominal aortic aneurysm 11/30/2011  . COPD (chronic obstructive pulmonary disease) 07/24/2011  . Long term current use of anticoagulant 02/21/2011  . CORONARY ATHEROSCLEROSIS NATIVE CORONARY ARTERY 10/12/2009  . ILIAC ARTERY ANEURYSM 10/12/2009  . COMPUTERIZED TOMOGRAPHY, CHEST, ABNORMAL 10/12/2009  . HYPERCHOLESTEROLEMIA, PURE 10/18/2008  . HYPERTENSION, BENIGN 10/18/2008  . Cardiomyopathy, ischemic 10/18/2008  . MITRAL STENOSIS/ INSUFFICIENCY, NON-RHEUMATIC 10/18/2008  . Atrial fibrillation 10/18/2008  . CAROTID ARTERY STENOSIS, WITHOUT INFARCTION 10/18/2008    Ryley Teater, PT Feb 24, 2015, 9:19 AM  [facility] [address] [place], [place], [address] Phone: [phone]   Fax:  [phone]

## 2015-02-12 NOTE — Telephone Encounter (Signed)
-----   Message from [name], MD sent at 02/07/2015  8:10 PM EST ----- Please enroll in [facility] device clinic with [facility] and schedule

## 2015-02-12 NOTE — Patient Instructions (Signed)

## 2015-02-12 NOTE — Telephone Encounter (Signed)
Pt agreed to [facility] clinic. 1st transmission 03-15-15

## 2015-02-15 ENCOUNTER — Encounter: Payer: Self-pay | Admitting: Internal Medicine

## 2015-02-16 ENCOUNTER — Ambulatory Visit: Payer: Medicare Other | Admitting: Rehabilitative and Restorative Service Providers"

## 2015-02-18 ENCOUNTER — Encounter: Payer: Medicare Other | Admitting: Rehabilitative and Restorative Service Providers"

## 2015-02-23 ENCOUNTER — Encounter: Payer: Medicare Other | Admitting: Rehabilitative and Restorative Service Providers"

## 2015-02-25 ENCOUNTER — Encounter: Payer: Medicare Other | Admitting: Rehabilitative and Restorative Service Providers"

## 2015-03-01 ENCOUNTER — Encounter: Payer: Self-pay | Admitting: Neurology

## 2015-03-01 ENCOUNTER — Ambulatory Visit (INDEPENDENT_AMBULATORY_CARE_PROVIDER_SITE_OTHER): Payer: Medicare Other | Admitting: Neurology

## 2015-03-01 VITALS — BP 133/80 | HR 72 | Ht 73.0 in | Wt 243.8 lb

## 2015-03-01 DIAGNOSIS — I6340 Cerebral infarction due to embolism of unspecified cerebral artery: Secondary | ICD-10-CM | POA: Diagnosis not present

## 2015-03-01 DIAGNOSIS — F329 Major depressive disorder, single episode, unspecified: Secondary | ICD-10-CM

## 2015-03-01 DIAGNOSIS — H8110 Benign paroxysmal vertigo, unspecified ear: Secondary | ICD-10-CM

## 2015-03-01 DIAGNOSIS — I480 Paroxysmal atrial fibrillation: Secondary | ICD-10-CM | POA: Diagnosis not present

## 2015-03-01 DIAGNOSIS — E785 Hyperlipidemia, unspecified: Secondary | ICD-10-CM | POA: Diagnosis not present

## 2015-03-01 DIAGNOSIS — I10 Essential (primary) hypertension: Secondary | ICD-10-CM | POA: Diagnosis not present

## 2015-03-01 DIAGNOSIS — F32A Depression, unspecified: Secondary | ICD-10-CM

## 2015-03-01 NOTE — Progress Notes (Signed)
STROKE NEUROLOGY FOLLOW UP NOTE  NAME: [name] [name] DOB: Jun 01, 1941  REASON FOR VISIT: stroke follow up HISTORY FROM: Patient and chart  Today we had the pleasure of seeing [name] [name] in follow-up at our Neurology Clinic. Pt was accompanied by no one.   History Summary [name] is a 74 y.o. male with history of atrial fibrillation on Pradaxa, CAD, prior left CEA, AAA, COPD, hyperlipidemia, hypertension, ischemic cardiomyopathy s/p pacemaker and previous stroke was admitted on 01/10/2015 for dizziness and right hemiparesis with hemisensory loss and vomiting. MRI not able to perform due to pacemaker. Initial CT and repeat CT did not show acute abnormalities. Stroke workup including CTA head and neck, carotid Doppler and 2-D echo unremarkable. Neuro exam found to have possible positive Dix-Hallpike and giveaway weakness on the right side. By further questioning, patient has suffered depression since his wife passed away. He was put on Celexa, and referred to outpatient BPPV maneuver. He was discharged in good condition with continuation of his Pradaxa and The statin for stroke prevention.  Interval History During the interval time, the patient has been doing well.  He stated that he had worked with PT OT twice a week for maneuver training and vestibular rehabilitation, and he is feeling great. His blood pressure 133/80 in clinic today. He has no weakness or numbness or any neurological deficit. His Celexa has been discontinued by his own. He stated that his depression much better, and he recently met a lady who also recently lost her husband, and they have good relationship each other.  REVIEW OF SYSTEMS: Full 14 system review of systems performed and notable only for those listed below and in HPI above, all others are negative:  Constitutional:   Cardiovascular:  Ear/Nose/Throat:   Skin:  Eyes:   Respiratory:  Cough, wheezing, shortness of breath Gastroitestinal:     Genitourinary:  Hematology/Lymphatic:  Bruise/bleed easily Endocrine:  Musculoskeletal:   Allergy/Immunology:   Neurological:   Psychiatric:  Sleep:   The following represents the patient's updated allergies and side effects list: No Known Allergies  The neurologically relevant items on the patient's problem list were reviewed on today's visit.  Neurologic Examination  A problem focused neurological exam (12 or more points of the single system neurologic examination, vital signs counts as 1 point, cranial nerves count for 8 points) was performed.  Blood pressure 133/80, pulse 72, height _0  (1.854 m), weight 243 lb 12.8 oz (110.587 kg).  General - Well nourished, well developed, in no apparent distress.  Ophthalmologic - fundi not visualized due to small pupils.  Cardiovascular - Regular rate and rhythm, no irregular heart rate heard.  Mental Status -  Level of arousal and orientation to time, place, and person were intact. Language including expression, naming, repetition, comprehension was assessed and found intact.  Cranial Nerves II - XII - II - Visual field intact OU. III, IV, VI - Extraocular movements intact. V - Facial sensation intact bilaterally. VII - Facial movement intact bilaterally. VIII - Hearing & vestibular intact bilaterally. X - Palate elevates symmetrically. XI - Chin turning & shoulder shrug intact bilaterally. XII - Tongue protrusion intact.  Motor Strength - The patient's strength was normal in all extremities and pronator drift was absent.  Bulk was normal and fasciculations were absent.   Motor Tone - Muscle tone was assessed at the neck and appendages and was normal.  Reflexes - The patient's reflexes were 1+ in all extremities and he had  no pathological reflexes.  Sensory - Light touch, temperature/pinprick were assessed and were normal.    Coordination - The patient had normal movements in the hands and feet with no ataxia or dysmetria.   Action and positional tremor were noted on both hands, R>L.  Gait and Station - broad-based, mildly slow and cautious gait, no stooped posturing.  Data reviewed: I personally reviewed the images and agree with the radiology interpretations.  Dg Chest 2 View 01/10/2015 No acute cardiopulmonary disease.   Ct Head Wo Contrast 01/11/2015 No acute intracranial abnormality. White matter changes likely represent chronic small vessel ischemic disease. Diffuse paranasal sinus disease.  01/10/2015 1. No acute intracranial abnormalities. 2. Age related volume loss and mild chronic microvascular ischemic change. 3. Significant sinus disease  CTA NECK 12/25/2014  1. No hemodynamically significant stenosis or vascular occlusion identified within the neck.  2. Moderate calcified plaque at the right carotid bifurcation and proximal right ICA with associated mild stenosis of 20-30% by NASCET criteria.  3. Sequelae of prior left carotid endarterectomy. The left ICA is widely patent.   CTA BRAIN 12/25/2014  1. No proximal branch occlusion identified within the intracranial circulation.  2. Moderate calcified atheromatous disease within the cavernous ICAs with associated stenosis of up to 50% by NASCET criteria.  3. Mild atherosclerotic plaque within the distal vertebral arteries bilaterally without significant stenosis.   Carotid Doppler 10/2014 - R ICA < 40%, L ICA patent s/p hx CEA  2-D echocardiogram 12/25/2014 - Left ventricle: The cavity size was severely dilated. Wallthickness was increased in a pattern of mild LVH. Systolicfunction was mildly to moderately reduced. The estimated ejectionfraction was in the range of 40% to 45%. Diffuse hypokinesis.There is akinesis of the inferior myocardium. Doppler parametersare consistent with abnormal left ventricular relaxation (grade 1diastolic dysfunction). - Left atrium: The atrium was moderately dilated. Impressions: - Inferior  akinesis with mild to moderately reduced LV function;grade 1 diastolic dysfunction; if clinically indicated, TEE wouldhave better sensitivity for source of embolus.  Component     Latest Ref Rng 12/24/2014 02/05/2015  Cholesterol     0 - 200 mg/dL 144   Triglycerides     <150 mg/dL 154 (H)   HDL     >39 mg/dL 38 (L)   Total CHOL/HDL Ratio      3.8   VLDL     0 - 40 mg/dL 31   LDL (calc)     0 - 99 mg/dL 75   Hemoglobin A1C     <5.7 % 5.5   Mean Plasma Glucose     <117 mg/dL 111   TSH     0.350 - 4.500 uIU/mL  0.620    Assessment: As you may recall, he is a 74 y.o. Caucasian male with PMH of afib on Pradaxa, CAD, prior left CEA, AAA, COPD, HLD, HTN, ischemic cardiomyopathy s/p pacemaker and previous strokewas admitted on 01/10/2015 for dizziness and right hemiparesis with hemisensory loss and vomiting. MRI not able to perform due to pacemaker. Initial CT and repeat CT did not show acute abnormalities. Stroke workup including CTA head and neck, carotid Doppler and 2-D echo unremarkable. Neuro exam found to have possible positive Dix-Hallpike and give-away weakness on the right side. By further questioning, patient suffered depression since his wife passed away. He was put on Celexa, and referred to outpatient BPPV maneuver. He was discharged in good condition with continuation of his Pradaxa and statin for stroke prevention. Since discharge, he was working with PT OT  for BPPV maneuver and vestibular rehabilitation, and he is doing fairly well. His depression also much better and currently off Celexa. Will continue Pradaxa and statin for stroke prevention, follow up with PT OT.  Plan:  - Continue Pradaxa and pravastatin for stroke prevention - Continue to follow up with cardiology for paroxysmal A. fib and pacemaker management - Follow up with your primary care physician for stroke risk factor modification. Recommend maintain blood pressure goal <130/80, diabetes with hemoglobin A1c goal  below 6.5% and lipids with LDL cholesterol goal below 70 mg/dL.  - Check BP at home - Follow-up with PT/OT for BPPV maneuver and vestibular rehabilitation - RTC in 4 months  No orders of the defined types were placed in this encounter.    No orders of the defined types were placed in this encounter.    Patient Instructions  - continue pradaxa and pravachol for stroke prevention - continue to follow up with cardiology for intermittent afib and pacemaker management - continue PT/OT for BPPV maneuver and vertigo - check BP at home - avoid fall - stay positive - Follow up with your primary care physician for stroke risk factor modification. Recommend maintain blood pressure goal <130/80, diabetes with hemoglobin A1c goal below 6.5% and lipids with LDL cholesterol goal below 70 mg/dL.  - follow up in 4 months.   Rosalin Hawking, MD PhD [facility] [address] [place], [place] [address] [phone]

## 2015-03-01 NOTE — Patient Instructions (Addendum)
-   continue pradaxa and pravachol for stroke prevention - continue to follow up with cardiology for intermittent afib and pacemaker management - continue PT/OT for BPPV maneuver and vertigo - check BP at home - avoid fall - stay positive - Follow up with your primary care physician for stroke risk factor modification. Recommend maintain blood pressure goal <130/80, diabetes with hemoglobin A1c goal below 6.5% and lipids with LDL cholesterol goal below 70 mg/dL.  - follow up in 4 months.

## 2015-03-02 ENCOUNTER — Encounter: Payer: Medicare Other | Admitting: Rehabilitative and Restorative Service Providers"

## 2015-03-04 ENCOUNTER — Inpatient Hospital Stay (HOSPITAL_COMMUNITY)
Admission: EM | Admit: 2015-03-04 | Discharge: 2015-03-06 | DRG: 190 | Disposition: A | Discharge status: Home / Self Care | Payer: Medicare Other | Attending: Internal Medicine | Admitting: Internal Medicine

## 2015-03-04 ENCOUNTER — Encounter (HOSPITAL_COMMUNITY): Payer: Self-pay | Admitting: Emergency Medicine

## 2015-03-04 ENCOUNTER — Emergency Department (HOSPITAL_COMMUNITY): Payer: Medicare Other

## 2015-03-04 DIAGNOSIS — J9601 Acute respiratory failure with hypoxia: Secondary | ICD-10-CM

## 2015-03-04 DIAGNOSIS — E785 Hyperlipidemia, unspecified: Secondary | ICD-10-CM | POA: Diagnosis present

## 2015-03-04 DIAGNOSIS — I2510 Atherosclerotic heart disease of native coronary artery without angina pectoris: Secondary | ICD-10-CM

## 2015-03-04 DIAGNOSIS — I25810 Atherosclerosis of coronary artery bypass graft(s) without angina pectoris: Secondary | ICD-10-CM | POA: Diagnosis present

## 2015-03-04 DIAGNOSIS — N182 Chronic kidney disease, stage 2 (mild): Secondary | ICD-10-CM | POA: Diagnosis present

## 2015-03-04 DIAGNOSIS — I340 Nonrheumatic mitral (valve) insufficiency: Secondary | ICD-10-CM | POA: Diagnosis present

## 2015-03-04 DIAGNOSIS — J9621 Acute and chronic respiratory failure with hypoxia: Secondary | ICD-10-CM | POA: Diagnosis present

## 2015-03-04 DIAGNOSIS — I723 Aneurysm of iliac artery: Secondary | ICD-10-CM | POA: Diagnosis present

## 2015-03-04 DIAGNOSIS — I714 Abdominal aortic aneurysm, without rupture, unspecified: Secondary | ICD-10-CM

## 2015-03-04 DIAGNOSIS — N183 Chronic kidney disease, stage 3 unspecified: Secondary | ICD-10-CM | POA: Diagnosis present

## 2015-03-04 DIAGNOSIS — I255 Ischemic cardiomyopathy: Secondary | ICD-10-CM

## 2015-03-04 DIAGNOSIS — Z95810 Presence of automatic (implantable) cardiac defibrillator: Secondary | ICD-10-CM | POA: Diagnosis present

## 2015-03-04 DIAGNOSIS — J449 Chronic obstructive pulmonary disease, unspecified: Secondary | ICD-10-CM | POA: Diagnosis present

## 2015-03-04 DIAGNOSIS — I447 Left bundle-branch block, unspecified: Secondary | ICD-10-CM | POA: Diagnosis present

## 2015-03-04 DIAGNOSIS — I252 Old myocardial infarction: Secondary | ICD-10-CM | POA: Diagnosis not present

## 2015-03-04 DIAGNOSIS — I10 Essential (primary) hypertension: Secondary | ICD-10-CM | POA: Diagnosis not present

## 2015-03-04 DIAGNOSIS — I5022 Chronic systolic (congestive) heart failure: Secondary | ICD-10-CM

## 2015-03-04 DIAGNOSIS — N289 Disorder of kidney and ureter, unspecified: Secondary | ICD-10-CM | POA: Diagnosis present

## 2015-03-04 DIAGNOSIS — I5042 Chronic combined systolic (congestive) and diastolic (congestive) heart failure: Secondary | ICD-10-CM | POA: Diagnosis present

## 2015-03-04 DIAGNOSIS — I480 Paroxysmal atrial fibrillation: Secondary | ICD-10-CM

## 2015-03-04 DIAGNOSIS — Z7901 Long term (current) use of anticoagulants: Secondary | ICD-10-CM | POA: Diagnosis not present

## 2015-03-04 DIAGNOSIS — E7800 Pure hypercholesterolemia, unspecified: Secondary | ICD-10-CM

## 2015-03-04 DIAGNOSIS — Z951 Presence of aortocoronary bypass graft: Secondary | ICD-10-CM

## 2015-03-04 DIAGNOSIS — I129 Hypertensive chronic kidney disease with stage 1 through stage 4 chronic kidney disease, or unspecified chronic kidney disease: Secondary | ICD-10-CM | POA: Diagnosis present

## 2015-03-04 DIAGNOSIS — J441 Chronic obstructive pulmonary disease with (acute) exacerbation: Secondary | ICD-10-CM | POA: Diagnosis present

## 2015-03-04 DIAGNOSIS — I4821 Permanent atrial fibrillation: Secondary | ICD-10-CM | POA: Diagnosis present

## 2015-03-04 DIAGNOSIS — Z8673 Personal history of transient ischemic attack (TIA), and cerebral infarction without residual deficits: Secondary | ICD-10-CM | POA: Diagnosis not present

## 2015-03-04 DIAGNOSIS — I4891 Unspecified atrial fibrillation: Secondary | ICD-10-CM | POA: Diagnosis present

## 2015-03-04 DIAGNOSIS — R0602 Shortness of breath: Secondary | ICD-10-CM | POA: Diagnosis not present

## 2015-03-04 LAB — COMPREHENSIVE METABOLIC PANEL
ALK PHOS: 76 U/L (ref 39–117)
ALT: 14 U/L (ref 0–53)
AST: 19 U/L (ref 0–37)
Albumin: 3.6 g/dL (ref 3.5–5.2)
Anion gap: 9 (ref 5–15)
BILIRUBIN TOTAL: 0.6 mg/dL (ref 0.3–1.2)
BUN: 19 mg/dL (ref 6–23)
CHLORIDE: 114 mmol/L — AB (ref 96–112)
CO2: 21 mmol/L (ref 19–32)
Calcium: 8.7 mg/dL (ref 8.4–10.5)
Creatinine, Ser: 1.32 mg/dL (ref 0.50–1.35)
GFR calc non Af Amer: 52 mL/min — ABNORMAL LOW (ref >90)
GFR, EST AFRICAN AMERICAN: 61 mL/min — AB (ref >90)
GLUCOSE: 110 mg/dL — AB (ref 70–99)
Potassium: 3.8 mmol/L (ref 3.5–5.1)
Sodium: 144 mmol/L (ref 135–145)
Total Protein: 6 g/dL (ref 6.0–8.3)

## 2015-03-04 LAB — I-STAT ARTERIAL BLOOD GAS, ED
Acid-base deficit: 6 mmol/L — ABNORMAL HIGH (ref 0.0–2.0)
BICARBONATE: 19.3 meq/L — AB (ref 20.0–24.0)
O2 SAT: 85 %
Patient temperature: 98.6
TCO2: 20 mmol/L (ref 0–100)
pCO2 arterial: 35.6 mmHg (ref 35.0–45.0)
pH, Arterial: 7.342 — ABNORMAL LOW (ref 7.350–7.450)
pO2, Arterial: 53 mmHg — ABNORMAL LOW (ref 80.0–100.0)

## 2015-03-04 LAB — CBC WITH DIFFERENTIAL/PLATELET
Basophils Absolute: 0.1 10*3/uL (ref 0.0–0.1)
Basophils Relative: 1 % (ref 0–1)
Eosinophils Absolute: 0.5 10*3/uL (ref 0.0–0.7)
Eosinophils Relative: 5 % (ref 0–5)
HCT: 47.5 % (ref 39.0–52.0)
Hemoglobin: 15.2 g/dL (ref 13.0–17.0)
Lymphocytes Relative: 39 % (ref 12–46)
Lymphs Abs: 3.3 10*3/uL (ref 0.7–4.0)
MCH: 30.2 pg (ref 26.0–34.0)
MCHC: 32 g/dL (ref 30.0–36.0)
MCV: 94.4 fL (ref 78.0–100.0)
Monocytes Absolute: 0.6 10*3/uL (ref 0.1–1.0)
Monocytes Relative: 7 % (ref 3–12)
NEUTROS ABS: 4 10*3/uL (ref 1.7–7.7)
NEUTROS PCT: 48 % (ref 43–77)
PLATELETS: 165 10*3/uL (ref 150–400)
RBC: 5.03 MIL/uL (ref 4.22–5.81)
RDW: 14.7 % (ref 11.5–15.5)
WBC: 8.5 10*3/uL (ref 4.0–10.5)

## 2015-03-04 LAB — INFLUENZA PANEL BY PCR (TYPE A & B)
H1N1 flu by pcr: NOT DETECTED
INFLAPCR: NEGATIVE
Influenza B By PCR: NEGATIVE

## 2015-03-04 LAB — I-STAT TROPONIN, ED: Troponin i, poc: 0.01 ng/mL (ref 0.00–0.08)

## 2015-03-04 LAB — BRAIN NATRIURETIC PEPTIDE: B NATRIURETIC PEPTIDE 5: 122.9 pg/mL — AB (ref 0.0–100.0)

## 2015-03-04 LAB — MRSA PCR SCREENING: MRSA by PCR: NEGATIVE

## 2015-03-04 MED ORDER — LEVOFLOXACIN IN D5W 750 MG/150ML IV SOLN
750.0000 mg | Freq: Once | INTRAVENOUS | Status: AC
  Administered 2015-03-04: 750 mg via INTRAVENOUS
  Filled 2015-03-04: qty 150

## 2015-03-04 MED ORDER — AMLODIPINE BESYLATE 10 MG PO TABS
10.0000 mg | ORAL_TABLET | Freq: Every day | ORAL | Status: DC
  Administered 2015-03-04 – 2015-03-06 (×3): 10 mg via ORAL
  Filled 2015-03-04 (×3): qty 1

## 2015-03-04 MED ORDER — DM-GUAIFENESIN ER 30-600 MG PO TB12
1.0000 | ORAL_TABLET | Freq: Two times a day (BID) | ORAL | Status: DC
  Administered 2015-03-04 – 2015-03-06 (×5): 1 via ORAL
  Filled 2015-03-04 (×7): qty 1

## 2015-03-04 MED ORDER — SODIUM CHLORIDE 0.9 % IV BOLUS (SEPSIS)
1000.0000 mL | Freq: Once | INTRAVENOUS | Status: AC
  Administered 2015-03-04: 1000 mL via INTRAVENOUS

## 2015-03-04 MED ORDER — METOPROLOL SUCCINATE 12.5 MG HALF TABLET
12.5000 mg | ORAL_TABLET | Freq: Every day | ORAL | Status: DC
  Administered 2015-03-04 – 2015-03-05 (×2): 12.5 mg via ORAL
  Filled 2015-03-04 (×2): qty 1

## 2015-03-04 MED ORDER — AZITHROMYCIN 250 MG PO TABS: 500.0000 mg | ORAL_TABLET | Freq: Every day | ORAL | Status: DC

## 2015-03-04 MED ORDER — FUROSEMIDE 20 MG PO TABS
40.0000 mg | ORAL_TABLET | Freq: Once | ORAL | Status: AC
  Administered 2015-03-04: 40 mg via ORAL
  Filled 2015-03-04: qty 2

## 2015-03-04 MED ORDER — IPRATROPIUM-ALBUTEROL 0.5-2.5 (3) MG/3ML IN SOLN
3.0000 mL | RESPIRATORY_TRACT | Status: DC
  Administered 2015-03-04 – 2015-03-06 (×13): 3 mL via RESPIRATORY_TRACT
  Filled 2015-03-04 (×11): qty 3

## 2015-03-04 MED ORDER — DOCUSATE SODIUM 100 MG PO CAPS
100.0000 mg | ORAL_CAPSULE | Freq: Two times a day (BID) | ORAL | Status: DC
  Administered 2015-03-04 – 2015-03-06 (×5): 100 mg via ORAL
  Filled 2015-03-04 (×7): qty 1

## 2015-03-04 MED ORDER — LEVOFLOXACIN 500 MG PO TABS
500.0000 mg | ORAL_TABLET | Freq: Every day | ORAL | Status: DC
  Administered 2015-03-05 – 2015-03-06 (×2): 500 mg via ORAL
  Filled 2015-03-04 (×2): qty 1

## 2015-03-04 MED ORDER — DABIGATRAN ETEXILATE MESYLATE 150 MG PO CAPS
150.0000 mg | ORAL_CAPSULE | Freq: Two times a day (BID) | ORAL | Status: DC
  Administered 2015-03-04 – 2015-03-06 (×5): 150 mg via ORAL
  Filled 2015-03-04 (×7): qty 1

## 2015-03-04 MED ORDER — HYDRALAZINE HCL 25 MG PO TABS
25.0000 mg | ORAL_TABLET | Freq: Three times a day (TID) | ORAL | Status: DC
  Administered 2015-03-04 – 2015-03-06 (×7): 25 mg via ORAL
  Filled 2015-03-04 (×11): qty 1

## 2015-03-04 MED ORDER — NIACIN ER (ANTIHYPERLIPIDEMIC) 500 MG PO TBCR
1000.0000 mg | EXTENDED_RELEASE_TABLET | Freq: Every day | ORAL | Status: DC
  Administered 2015-03-05 (×2): 1000 mg via ORAL
  Filled 2015-03-04 (×4): qty 2

## 2015-03-04 MED ORDER — ALBUTEROL (5 MG/ML) CONTINUOUS INHALATION SOLN
10.0000 mg/h | INHALATION_SOLUTION | RESPIRATORY_TRACT | Status: DC
  Administered 2015-03-04: 10 mg/h via RESPIRATORY_TRACT
  Filled 2015-03-04: qty 20

## 2015-03-04 MED ORDER — IPRATROPIUM BROMIDE 0.02 % IN SOLN
0.5000 mg | Freq: Once | RESPIRATORY_TRACT | Status: AC
  Administered 2015-03-04: 0.5 mg via RESPIRATORY_TRACT
  Filled 2015-03-04: qty 2.5

## 2015-03-04 MED ORDER — ALBUTEROL SULFATE (2.5 MG/3ML) 0.083% IN NEBU: 2.5000 mg | INHALATION_SOLUTION | RESPIRATORY_TRACT | Status: DC | PRN

## 2015-03-04 MED ORDER — ADULT MULTIVITAMIN W/MINERALS CH
1.0000 | ORAL_TABLET | Freq: Every day | ORAL | Status: DC
  Administered 2015-03-04 – 2015-03-06 (×3): 1 via ORAL
  Filled 2015-03-04 (×3): qty 1

## 2015-03-04 MED ORDER — LISINOPRIL 40 MG PO TABS
40.0000 mg | ORAL_TABLET | Freq: Every day | ORAL | Status: DC
  Administered 2015-03-04 – 2015-03-06 (×3): 40 mg via ORAL
  Filled 2015-03-04 (×3): qty 1

## 2015-03-04 MED ORDER — PRAVASTATIN SODIUM 80 MG PO TABS
80.0000 mg | ORAL_TABLET | Freq: Every day | ORAL | Status: DC
  Administered 2015-03-04 – 2015-03-05 (×2): 80 mg via ORAL
  Filled 2015-03-04 (×3): qty 1

## 2015-03-04 MED ORDER — AZITHROMYCIN 250 MG PO TABS: 250.0000 mg | ORAL_TABLET | Freq: Every day | ORAL | Status: DC

## 2015-03-04 MED ORDER — METHYLPREDNISOLONE SODIUM SUCC 125 MG IJ SOLR
60.0000 mg | Freq: Three times a day (TID) | INTRAMUSCULAR | Status: DC
  Administered 2015-03-04 – 2015-03-06 (×6): 60 mg via INTRAVENOUS
  Filled 2015-03-04: qty 2
  Filled 2015-03-04 (×7): qty 0.96
  Filled 2015-03-04: qty 2
  Filled 2015-03-04: qty 0.96

## 2015-03-04 MED ORDER — AMIODARONE HCL 200 MG PO TABS
200.0000 mg | ORAL_TABLET | Freq: Every day | ORAL | Status: DC
  Administered 2015-03-04 – 2015-03-06 (×3): 200 mg via ORAL
  Filled 2015-03-04 (×3): qty 1

## 2015-03-04 MED ORDER — MAGNESIUM SULFATE 2 GM/50ML IV SOLN
2.0000 g | Freq: Once | INTRAVENOUS | Status: AC
  Administered 2015-03-04: 2 g via INTRAVENOUS
  Filled 2015-03-04: qty 50

## 2015-03-04 NOTE — Progress Notes (Signed)
Patient Demographics  [name] [name], is a 74 y.o. male, DOB - 20-Jul-1941, AVW:[id number]  Admit date - 03/04/2015   Admitting Physician Lorretta Harp, MD  Outpatient Primary MD for the patient is [name], MD  LOS - 0   Chief Complaint  Patient presents with  . Shortness of Breath        Subjective:   [name] [name] today has, No headache, No chest pain, No abdominal pain - No Nausea, No new weakness tingling or numbness, ++ Cough & SOB.    Assessment & Plan    1. Acute on chronic hypoxic respiratory failure due to COPD exacerbation. Will be kept in the hospital, IV steroids will be continued along with empiric Levaquin, chest x-ray nonacute, and tinea scheduled and as needed nebulizer treatments. Continue oxygen supplementation and monitor. Follow influenza panel, legionella and strep pneumococcal antigen.   2. 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 Currently compensated from this standpoint, no evidence of fluid overload, continue ACE inhibitor, low-dose beta blocker and as needed Lasix. Monitor weight daily.   3. Proximal atrial fibrillation. CHA2DS2-VASc Score is 6 - on amiodarone, beta blocker along with Pradaxa which will be continued. No acute issues.   4. CAD with Chronic left bundle branch block. Stable. Continue beta blocker and Pradaxa for secondary prevention along with statin.   5. Dyslipidemia. On statin continue   6. Essential hypertension. Continue Norvasc, hydralazine and low-dose beta blocker.    7.'s tree of stroke. On Pradaxa and statin for secondary prevention.      Code Status: Full  Family Communication: None  Disposition Plan: Home   Procedures None   Consults   None   Medications  Scheduled Meds: . amiodarone  200  mg Oral Daily  . amLODipine  10 mg Oral Daily  . dabigatran  150 mg Oral BID  . dextromethorphan-guaiFENesin  1 tablet Oral BID  . docusate sodium  100 mg Oral Q12H  . hydrALAZINE  25 mg Oral 3 times per day  . ipratropium-albuterol  3 mL Nebulization Q4H  . [START ON 03/05/2015] levofloxacin  500 mg Oral Daily  . lisinopril  40 mg Oral Daily  . methylPREDNISolone (SOLU-MEDROL) injection  60 mg Intravenous 3 times per day  . metoprolol succinate  12.5 mg Oral Daily  . multivitamin with minerals  1 tablet Oral Daily  . niacin  1,000 mg Oral QHS  . pravastatin  80 mg Oral q1800   Continuous Infusions:  PRN Meds:.albuterol  DVT Prophylaxis  Pradexa  Lab Results  Component Value Date   PLT 165 03/04/2015    Antibiotics     Anti-infectives    Start     Dose/Rate Route Frequency Ordered Stop   03/05/15 1000  azithromycin (ZITHROMAX) tablet 250 mg  Status:  Discontinued     250 mg Oral Daily 03/04/15 0559 03/04/15 0646   03/05/15 1000  levofloxacin (LEVAQUIN) tablet 500 mg     500 mg Oral Daily 03/04/15 0646     03/04/15 1000  azithromycin (ZITHROMAX) tablet 500 mg  Status:  Discontinued     500 mg Oral Daily 03/04/15 0559 03/04/15 0646   03/04/15 0445  levofloxacin (LEVAQUIN) IVPB 750 mg     750  mg 100 mL/hr over 90 Minutes Intravenous  Once 03/04/15 0433 03/04/15 0646          Objective:   Filed Vitals:   03/04/15 0721 03/04/15 0745 03/04/15 0815 03/04/15 0830  BP:  125/56 126/54 147/61  Pulse: 92 81 92 88  Temp:      TempSrc:      Resp: 21 23 27 19   Height:      Weight:      SpO2: 99% 93% 92% 92%    Wt Readings from Last 3 Encounters:  03/04/15 109.317 kg (241 lb)  03/01/15 110.587 kg (243 lb 12.8 oz)  02/05/15 110.269 kg (243 lb 1.6 oz)     Intake/Output Summary (Last 24 hours) at 03/04/15 0852 Last data filed at 03/04/15 0646  Gross per 24 hour  Intake    150 ml  Output      0 ml  Net    150 ml     Physical Exam  Awake Alert, Oriented X 3, No  new F.N deficits, Normal affect [place].AT,PERRAL Supple Neck,No JVD, No cervical lymphadenopathy appriciated.  Symmetrical Chest wall movement, Mod air movement bilaterally, bilateral wheezing RRR,No Gallops,Rubs or new Murmurs, No Parasternal Heave +ve B.Sounds, Abd Soft, No tenderness, No organomegaly appriciated, No rebound - guarding or rigidity. No Cyanosis, Clubbing or edema, No new Rash or bruise      Data Review   Micro Results No results found for this or any previous visit (from the past 240 hour(s)).  Radiology Reports Dg Chest Port 1 View  03/04/2015   CLINICAL DATA:  Shortness of breath for 1 day.  EXAM: PORTABLE CHEST - 1 VIEW  COMPARISON:  Frontal and lateral views 01/10/2015  FINDINGS: Dual lead left-sided pacemaker remains in place. Patient is post median sternotomy and CABG. There is stable cardiomegaly. Increased bibasilar atelectasis. Mild hyperinflation again seen. Mild prominence of central pulmonary vasculature without overt edema. No large pleural effusion or pneumothorax. No acute osseous abnormalities.  IMPRESSION: Increased bibasilar atelectasis.  Stable mild cardiomegaly.   Electronically Signed   By: Rubye Oaks M.D.   On: 03/04/2015 04:24     CBC  Recent Labs Lab 03/04/15 0402  WBC 8.5  HGB 15.2  HCT 47.5  PLT 165  MCV 94.4  MCH 30.2  MCHC 32.0  RDW 14.7  LYMPHSABS 3.3  MONOABS 0.6  EOSABS 0.5  BASOSABS 0.1    Chemistries   Recent Labs Lab 03/04/15 0402  NA 144  K 3.8  CL 114*  CO2 21  GLUCOSE 110*  BUN 19  CREATININE 1.32  CALCIUM 8.7  AST 19  ALT 14  ALKPHOS 76  BILITOT 0.6   ------------------------------------------------------------------------------------------------------------------ estimated creatinine clearance is 65.6 mL/min (by C-G formula based on Cr of 1.32). ------------------------------------------------------------------------------------------------------------------ No results for input(s): HGBA1C in the  last 72 hours. ------------------------------------------------------------------------------------------------------------------ No results for input(s): CHOL, HDL, LDLCALC, TRIG, CHOLHDL, LDLDIRECT in the last 72 hours. ------------------------------------------------------------------------------------------------------------------ No results for input(s): TSH, T4TOTAL, T3FREE, THYROIDAB in the last 72 hours.  Invalid input(s): FREET3 ------------------------------------------------------------------------------------------------------------------ No results for input(s): VITAMINB12, FOLATE, FERRITIN, TIBC, IRON, RETICCTPCT in the last 72 hours.  Coagulation profile No results for input(s): INR, PROTIME in the last 168 hours.  No results for input(s): DDIMER in the last 72 hours.  Cardiac Enzymes No results for input(s): CKMB, TROPONINI, MYOGLOBIN in the last 168 hours.  Invalid input(s): CK ------------------------------------------------------------------------------------------------------------------ Invalid input(s): POCBNP     Time Spent in minutes  35   Tyke Outman K  M.D on 03/04/2015 at 8:52 AM  Between 7am to 7pm - Pager - [phone]  After 7pm go to [email or web address] - password [facility]  [facility]   Office  [phone]

## 2015-03-04 NOTE — Care Management Note (Unsigned)
    Page 1 of 1   03/04/2015     3:56:24 PM CARE MANAGEMENT NOTE 03/04/2015  Patient:  [name] [name], [name] [name]   Account Number:  [id number]  Date Initiated:  03/04/2015  Documentation initiated by:  COLE,ANGELA  Subjective/Objective Assessment:   PTA from home with daughter admitted with worsening SOB.     Action/Plan:   Return to home when medically stable. CM to f/u with d/c needs.   Anticipated DC Date:     Anticipated DC Plan:        DC Planning Services  CM consult      Choice offered to / List presented to:             Status of service:   Medicare Important Message given?  NA - LOS <3 / Initial given by admissions (If response is "NO", the following Medicare IM given date fields will be blank) Date Medicare IM given:   Medicare IM given by:   Date Additional Medicare IM given:   Additional Medicare IM given by:    Discharge Disposition:    Per UR Regulation:  Reviewed for med. necessity/level of care/duration of stay  If discussed at Long Length of Stay Meetings, dates discussed:    Comments:

## 2015-03-04 NOTE — ED Provider Notes (Signed)
CSN: [id number]     Arrival date & time 03/04/15  0346 History   First MD Initiated Contact with Patient 03/04/15 [phone]     Chief Complaint  Patient presents with  . Shortness of Breath     (Consider location/radiation/quality/duration/timing/severity/associated sxs/prior Treatment) HPI [name] [name] is a 74yo male with PMH of COPD, a fib, CVA, HTN, HLD, aortic dissection presenting today with SOB.  He states this has been going on for several days but it has been worsening.  He admits to productive cough as well, but it has been unchanged. He denies fevers or change in his sputum.   He does not have pain anywhere. He called EMS, who gave the patient solumedrol, duoneb and 5mg  albuterol for treatment.  He states his breathing has improved but not at baseline currently.  He has no further complaints.    Past Medical History  Diagnosis Date  . Atrial fibrillation     s/p prior DCCV;  Amiodarone Rx.  . CAD (coronary artery disease)     s/p CABG;  cath 10/07:  LM ok, LAD occluded, AV CFX 95%, pRCA occluded; L-LAD, S-OM2/OM3, S-PDA ok  . Cerebrovascular disease     s/p prior Left CEA;  followed by Dr. [name]  . COPD (chronic obstructive pulmonary disease)   . Hyperlipidemia   . Hypertension   . Mitral insufficiency     hx of. not noted on echo 01/2012  . Nephrolithiasis   . Ischemic cardiomyopathy     echo 5/09: EF 55%, mild LAE;  Myoview 11/10: inf and apical scar with mild peri-infarct ischemia, EF 44%. Echo 01/26/12 with EF 20-25% but previously known to be 30% on echoes before last one  . Iliac artery aneurysm, left     followed by Dr. [name]  . Aortic dissection     H/O focal aortic dissection  . LBBB (left bundle branch block)   . CHF (congestive heart failure)   . Myocardial infarction   . Renal insufficiency     01/2012  . Stroke   . CVA (cerebral infarction)    Past Surgical History  Procedure Laterality Date  . Uteroscopy    . Coronary artery bypass graft  1998  . Left cea   07/05  . Appendectomy  09/11/11  . Carotid endarterectomy  2005    left  . Biventricular defibrillator implantation  08/22/12    SJM Quadra Assura BiV ICD implanted by Dr [name]  . Spine surgery  Sep 27, 2014    Lower Back L 4  and   L 5  . Bi-ventricular implantable cardioverter defibrillator N/A 08/22/2012    Procedure: BI-VENTRICULAR IMPLANTABLE CARDIOVERTER DEFIBRILLATOR  (CRT-D);  Surgeon: [name], MD;  Location: [facility] CATH LAB;  Service: Cardiovascular;  Laterality: N/A;   Family History  Problem Relation Age of Onset  . Stroke Maternal Uncle    History  Substance Use Topics  . Smoking status: Former Smoker -- 1.50 packs/day for 57 years    Types: Cigarettes    Quit date: 12/11/2004  . Smokeless tobacco: Never Used  . Alcohol Use: No     Comment: 6 yrs ago     Review of Systems    Allergies  Review of patient's allergies indicates no known allergies.  Home Medications   Prior to Admission medications   Medication Sig Start Date End Date Taking? Authorizing Provider  albuterol (PROVENTIL HFA;VENTOLIN HFA) 108 (90 BASE) MCG/ACT inhaler Inhale 2 puffs into the lungs every 4 (four)  hours as needed for wheezing. 03/29/13   Cathren Laine, MD  amiodarone (PACERONE) 200 MG tablet TAKE 1 TABLET EVERY DAY Patient taking differently: TAKE 1 TABLET BY MOUTH EVERY DAY 08/24/14   Lewayne Bunting, MD  amLODipine (NORVASC) 10 MG tablet Take 1 tablet (10 mg total) by mouth daily. 04/30/14   Lewayne Bunting, MD  dabigatran (PRADAXA) 150 MG CAPS capsule Take 1 capsule (150 mg total) by mouth 2 (two) times daily. 12/25/14   Costin Otelia Sergeant, MD  docusate sodium (COLACE) 100 MG capsule Take 1 capsule (100 mg total) by mouth every 12 (twelve) hours. 06/04/14   Rolland Porter, MD  furosemide (LASIX) 40 MG tablet Take 40 mg by mouth daily as needed for fluid or edema.  08/29/13   Lewayne Bunting, MD  hydrALAZINE (APRESOLINE) 50 MG tablet Take 0.5 tablets (25 mg total) by mouth 3 (three) times daily.  12/15/14   Lewayne Bunting, MD  ipratropium-albuterol (DUONEB) 0.5-2.5 (3) MG/3ML SOLN Take 3 mLs by nebulization every 4 (four) hours as needed (for shortness of breath).    Historical Provider, MD  lisinopril (PRINIVIL,ZESTRIL) 40 MG tablet TAKE 1 TABLET EVERY DAY 12/29/14   Lewayne Bunting, MD  metoprolol succinate (TOPROL-XL) 25 MG 24 hr tablet Take 1 tablet (25 mg total) by mouth daily. Patient taking differently: Take 12.5 mg by mouth daily.  12/15/14   Lewayne Bunting, MD  Multiple Vitamin (MULITIVITAMIN WITH MINERALS) TABS Take 1 tablet by mouth daily.    Historical Provider, MD  niacin (NIASPAN) 500 MG CR tablet Take 1,000 mg by mouth at bedtime.     Historical Provider, MD  potassium chloride SA (K-DUR,KLOR-CON) 20 MEQ tablet TAKE 2 TABLETS THREE TIMES DAILY 11/11/14   Lewayne Bunting, MD  pravastatin (PRAVACHOL) 80 MG tablet Take 1 tablet (80 mg total) by mouth daily. 07/24/14   Lewayne Bunting, MD   Pulse 79  Temp(Src) 98.3 F (36.8 C) (Oral)  Resp 16  Ht  (1.854 m)  Wt 241 lb (109.317 kg)  BMI 31.80 kg/m2  SpO2 92% Physical Exam  Constitutional: He is oriented to person, place, and time. Vital signs are normal. He appears well-developed and well-nourished.  Non-toxic appearance. He does not appear ill. He appears distressed.  HENT:  Head: Normocephalic and atraumatic.  Nose: Nose normal.  Mouth/Throat: Oropharynx is clear and moist. No oropharyngeal exudate.  Eyes: Conjunctivae and EOM are normal. Pupils are equal, round, and reactive to light. No scleral icterus.  Neck: Normal range of motion. Neck supple. No tracheal deviation, no edema, no erythema and normal range of motion present. No thyroid mass and no thyromegaly present.  Cardiovascular: Normal rate, regular rhythm, S1 normal, S2 normal, normal heart sounds, intact distal pulses and normal pulses.  Exam reveals no gallop and no friction rub.   No murmur heard. Pulses:      Radial pulses are 2+ on the right side,  and 2+ on the left side.       Dorsalis pedis pulses are 2+ on the right side, and 2+ on the left side.  Pulmonary/Chest: He is in respiratory distress. He has wheezes. He has no rhonchi. He has no rales.  Tachypnea, inc wob, + use of accessory muscles  Abdominal: Soft. Normal appearance and bowel sounds are normal. He exhibits no distension, no ascites and no mass. There is no hepatosplenomegaly. There is no tenderness. There is no rebound, no guarding and no CVA tenderness.  Musculoskeletal: Normal range of motion. He exhibits no edema or tenderness.  Lymphadenopathy:    He has no cervical adenopathy.  Neurological: He is alert and oriented to person, place, and time. He has normal strength. No cranial nerve deficit or sensory deficit. He exhibits normal muscle tone.  Skin: Skin is warm, dry and intact. No petechiae and no rash noted. He is not diaphoretic. No erythema. No pallor.  Psychiatric: He has a normal mood and affect. His behavior is normal. Judgment normal.  Nursing note and vitals reviewed.   ED Course  Procedures (including critical care time) Labs Review Labs Reviewed  CULTURE, BLOOD (ROUTINE X 2)  CULTURE, BLOOD (ROUTINE X 2)  CBC WITH DIFFERENTIAL/PLATELET  COMPREHENSIVE METABOLIC PANEL  BRAIN NATRIURETIC PEPTIDE  I-STAT TROPOININ, ED    Imaging Review Dg Chest Port 1 View  03/04/2015   CLINICAL DATA:  Shortness of breath for 1 day.  EXAM: PORTABLE CHEST - 1 VIEW  COMPARISON:  Frontal and lateral views 01/10/2015  FINDINGS: Dual lead left-sided pacemaker remains in place. Patient is post median sternotomy and CABG. There is stable cardiomegaly. Increased bibasilar atelectasis. Mild hyperinflation again seen. Mild prominence of central pulmonary vasculature without overt edema. No large pleural effusion or pneumothorax. No acute osseous abnormalities.  IMPRESSION: Increased bibasilar atelectasis.  Stable mild cardiomegaly.   Electronically Signed   By: Rubye Oaks  M.D.   On: 03/04/2015 04:24     EKG Interpretation   Date/Time:  Thursday March 04 2015 03:56:35 EDT Ventricular Rate:  71 PR Interval:  182 QRS Duration: 135 QT Interval:  471 QTC Calculation: 512 R Axis:   -33 Text Interpretation:  Sinus rhythm Left bundle branch block No significant  change since last tracing Confirmed by Erroll Luna [phone]) on  03/04/2015 4:33:07 AM      MDM   Final diagnoses:  None    Patient presents for worsening shortness of breath after several days.  He has resp distress and wheezing on exam.  Will give another duoneb treatment and hr long continuous treatment.  He was given magnesium as well.  Continues to be hypoxic on RA requiring admission.  Patient normally does not wear [place] at home.  CXR is negative for pneumonia.  Patient given levoquin for COPD exacerbation.  He continues to be 90-94% on 5L .  Patient will require admission for continued treatment.  CRITICAL CARE Performed by: Tomasita Crumble   Total critical care time: resp distress  Critical care time was exclusive of separately billable procedures and treating other patients.  Critical care was necessary to treat or prevent imminent or life-threatening deterioration.  Critical care was time spent personally by me on the following activities: development of treatment plan with patient and/or surrogate as well as nursing, discussions with consultants, evaluation of patient's response to treatment, examination of patient, obtaining history from patient or surrogate, ordering and performing treatments and interventions, ordering and review of laboratory studies, ordering and review of radiographic studies, pulse oximetry and re-evaluation of patient's condition.     Tomasita Crumble, MD 03/04/15 [phone]

## 2015-03-04 NOTE — ED Notes (Signed)
MD at bedside. 

## 2015-03-04 NOTE — Progress Notes (Signed)
Called report to [facility] on 5W

## 2015-03-04 NOTE — ED Notes (Signed)
Pt is from home, hx of COPD. Pt reports he normally gives himself a breathing tx/s every day, pt reports he gave himself one before he went to bed. Pt woke up at 0230 with extreme SOB. Hx of a stroke two weeks ago, hospitalized and discharged with PT. Also hx of open heart surgery, pt has a pacemaker. EMS adm 125 solu-medrol, 1 duoneb and an additonal 5 mg of albuterol.

## 2015-03-04 NOTE — ED Notes (Signed)
Dr. oni at the bedside.  

## 2015-03-04 NOTE — Evaluation (Signed)
Physical Therapy Evaluation Patient Details Name: [name] [name] MRN: [id number] DOB: February 20, 1941 Today's Date: 03/04/2015   History of Present Illness  Pt adm with acute on chronic hypoxic respiratory failure due to COPD exacerbation. PMH - BPPV, HTN, A-fib, CAD, AICD, CVA (recent)  Clinical Impression  Pt admitted with above diagnosis. Pt currently with functional limitations due to the deficits listed below (see PT Problem List).  Pt will benefit from skilled PT to increase their independence and safety with mobility to allow discharge to the venue listed below.  Pt very motivated and expect he will make good progress.     Follow Up Recommendations Outpatient PT (possibly return to OPPT)    Equipment Recommendations  None recommended by PT    Recommendations for Other Services       Precautions / Restrictions Precautions Precautions: Fall      Mobility  Bed Mobility Overal bed mobility: Modified Independent                Transfers Overall transfer level: Needs assistance Equipment used: None Transfers: Sit to/from Stand Sit to Stand: Min assist         General transfer comment: Assist for balance  Ambulation/Gait Ambulation/Gait assistance: Min assist Ambulation Distance (Feet): 175 Feet Assistive device: None Gait Pattern/deviations: Step-through pattern;Staggering left;Staggering right   Gait velocity interpretation: Below normal speed for age/gender General Gait Details: Pt unsteady and with loss of balance requiring assist to prevent fall.  Stairs            Wheelchair Mobility    Modified Rankin (Stroke Patients Only)       Balance Overall balance assessment: Needs assistance Sitting-balance support: No upper extremity supported;Feet supported Sitting balance-Leahy Scale: Normal     Standing balance support: Single extremity supported Standing balance-Leahy Scale: Poor Standing balance comment: support of upper extremity for  balance.                             Pertinent Vitals/Pain Pain Assessment: No/denies pain    Home Living Family/patient expects to be discharged to:: Private residence Living Arrangements: Other relatives (stepdaughter but she isn't there much) Available Help at Discharge: Family;Available PRN/intermittently Type of Home: Mobile home Home Access: Stairs to enter Entrance Stairs-Rails: Can reach both Entrance Stairs-Number of Steps: 2 Home Layout: One level Home Equipment: Walker - 2 wheels;Grab bars - tub/shower;Wheelchair - manual;Cane - single point;Other (comment);Bedside commode;Tub bench      Prior Function Level of Independence: Independent         Comments: Used walker upon initially returning home after last hospitalization but able to go without assistive device after OPPT     Hand Dominance   Dominant Hand: Right    Extremity/Trunk Assessment   Upper Extremity Assessment: Defer to OT evaluation           Lower Extremity Assessment: Generalized weakness         Communication   Communication: No difficulties  Cognition Arousal/Alertness: Awake/alert Behavior During Therapy: WFL for tasks assessed/performed Overall Cognitive Status: Within Functional Limits for tasks assessed                      General Comments      Exercises        Assessment/Plan    PT Assessment Patient needs continued PT services  PT Diagnosis Difficulty walking;Generalized weakness   PT Problem List Decreased strength;Decreased activity tolerance;Decreased balance;Decreased  mobility  PT Treatment Interventions DME instruction;Gait training;Functional mobility training;Therapeutic activities;Therapeutic exercise;Patient/family education;Balance training   PT Goals (Current goals can be found in the Care Plan section) Acute Rehab PT Goals Patient Stated Goal: Return home and to prior level of function PT Goal Formulation: With patient Time For Goal  Achievement: 03/11/15 Potential to Achieve Goals: Good    Frequency Min 3X/week   Barriers to discharge        Co-evaluation               End of Session Equipment Utilized During Treatment: Oxygen Activity Tolerance: Patient limited by fatigue Patient left: in bed;with call bell/phone within reach Nurse Communication: Mobility status         Time: 6314-9702 PT Time Calculation (min) (ACUTE ONLY): 19 min   Charges:   PT Evaluation $Initial PT Evaluation Tier I: 1 Procedure     PT G Codes:        Emanuele Mcwhirter 03-Apr-2015, 4:13 PM  [facility] PT [phone]

## 2015-03-04 NOTE — Progress Notes (Signed)
UR COMPLETED  

## 2015-03-04 NOTE — ED Notes (Signed)
RT called and informed of hour long breathing tx ordered.

## 2015-03-04 NOTE — H&P (Addendum)
[facility] History and Physical  [name] [name] UJW:[id number] DOB: 05-04-1941 DOA: 03/04/2015  Referring physician: ED physician PCP: [name], MD  Specialists:   Chief Complaint: Worsening shortness of breath and productive cough  HPI: [name] [name] is a 74 y.o. male with past medical history of HTN, hyperlipidemia, COPD, atrial fibrillation on Pradaxa, history of stroke, aortic dissection, combined diastolic and systolic congestive heart failure, AICD placement, AAA, left bundle blockage, mitral valve disorder, coronary artery disease, who presents with worsening shortness of breath.  Patient reports that he has worsening shortness of breath in the past several days. He has productive cough, but did not pay attention about the color of sputum. He has sick contacts with his stepdaughter who had cold recently. Patient does not have fever, chills, chest pain. He has CHF and is taking lasix prn. His body weight has been stable. Patient dose not have abdominal pain, diarrhea, constipation, dysuria, urgency, frequency, hematuria, skin rashes. No unilateral weakness, numbness or tingling sensations. No vision change or hearing loss.  In ED, patient was found to have negative chest x-ray for acute abnormalities. Troponin negative, BNP 122.9, WBC 8.5, electrolytes okay, temperature normal. Patient is admitted to inpatient for further evaluation and treatment.  Review of Systems: As presented in the history of presenting illness, rest negative.  Where does patient live?  At home Can patient participate in ADLs? some  Allergy: No Known Allergies  Past Medical History  Diagnosis Date  . Atrial fibrillation     s/p prior DCCV;  Amiodarone Rx.  . CAD (coronary artery disease)     s/p CABG;  cath 10/07:  LM ok, LAD occluded, AV CFX 95%, pRCA occluded; L-LAD, S-OM2/OM3, S-PDA ok  . Cerebrovascular disease     s/p prior Left CEA;  followed by Dr. [name]  . COPD (chronic obstructive  pulmonary disease)   . Hyperlipidemia   . Hypertension   . Mitral insufficiency     hx of. not noted on echo 01/2012  . Nephrolithiasis   . Ischemic cardiomyopathy     echo 5/09: EF 55%, mild LAE;  Myoview 11/10: inf and apical scar with mild peri-infarct ischemia, EF 44%. Echo 01/26/12 with EF 20-25% but previously known to be 30% on echoes before last one  . Iliac artery aneurysm, left     followed by Dr. [name]  . Aortic dissection     H/O focal aortic dissection  . LBBB (left bundle branch block)   . CHF (congestive heart failure)   . Myocardial infarction   . Renal insufficiency     01/2012  . Stroke   . CVA (cerebral infarction)     Past Surgical History  Procedure Laterality Date  . Uteroscopy    . Coronary artery bypass graft  1998  . Left cea  07/05  . Appendectomy  09/11/11  . Carotid endarterectomy  2005    left  . Biventricular defibrillator implantation  08/22/12    SJM Quadra Assura BiV ICD implanted by Dr [name]  . Spine surgery  Sep 27, 2014    Lower Back L 4  and   L 5  . Bi-ventricular implantable cardioverter defibrillator N/A 08/22/2012    Procedure: BI-VENTRICULAR IMPLANTABLE CARDIOVERTER DEFIBRILLATOR  (CRT-D);  Surgeon: [name], MD;  Location: [facility] CATH LAB;  Service: Cardiovascular;  Laterality: N/A;    Social History:  reports that he quit smoking about 10 years ago. His smoking use included Cigarettes. He has a 85.5 pack-year smoking history.  He has never used smokeless tobacco. He reports that he does not drink alcohol or use illicit drugs.  Family History:  Family History  Problem Relation Age of Onset  . Stroke Maternal Uncle      Prior to Admission medications   Medication Sig Start Date End Date Taking? Authorizing Provider  albuterol (PROVENTIL HFA;VENTOLIN HFA) 108 (90 BASE) MCG/ACT inhaler Inhale 2 puffs into the lungs every 4 (four) hours as needed for wheezing. 03/29/13   Cathren Laine, MD  amiodarone (PACERONE) 200 MG tablet TAKE 1  TABLET EVERY DAY Patient taking differently: TAKE 1 TABLET BY MOUTH EVERY DAY 08/24/14   Lewayne Bunting, MD  amLODipine (NORVASC) 10 MG tablet Take 1 tablet (10 mg total) by mouth daily. 04/30/14   Lewayne Bunting, MD  dabigatran (PRADAXA) 150 MG CAPS capsule Take 1 capsule (150 mg total) by mouth 2 (two) times daily. 12/25/14   Costin Otelia Sergeant, MD  docusate sodium (COLACE) 100 MG capsule Take 1 capsule (100 mg total) by mouth every 12 (twelve) hours. 06/04/14   Rolland Porter, MD  furosemide (LASIX) 40 MG tablet Take 40 mg by mouth daily as needed for fluid or edema.  08/29/13   Lewayne Bunting, MD  hydrALAZINE (APRESOLINE) 50 MG tablet Take 0.5 tablets (25 mg total) by mouth 3 (three) times daily. 12/15/14   Lewayne Bunting, MD  ipratropium-albuterol (DUONEB) 0.5-2.5 (3) MG/3ML SOLN Take 3 mLs by nebulization every 4 (four) hours as needed (for shortness of breath).    Historical Provider, MD  lisinopril (PRINIVIL,ZESTRIL) 40 MG tablet TAKE 1 TABLET EVERY DAY 12/29/14   Lewayne Bunting, MD  metoprolol succinate (TOPROL-XL) 25 MG 24 hr tablet Take 1 tablet (25 mg total) by mouth daily. Patient taking differently: Take 12.5 mg by mouth daily.  12/15/14   Lewayne Bunting, MD  Multiple Vitamin (MULITIVITAMIN WITH MINERALS) TABS Take 1 tablet by mouth daily.    Historical Provider, MD  niacin (NIASPAN) 500 MG CR tablet Take 1,000 mg by mouth at bedtime.     Historical Provider, MD  potassium chloride SA (K-DUR,KLOR-CON) 20 MEQ tablet TAKE 2 TABLETS THREE TIMES DAILY 11/11/14   Lewayne Bunting, MD  pravastatin (PRAVACHOL) 80 MG tablet Take 1 tablet (80 mg total) by mouth daily. 07/24/14   Lewayne Bunting, MD    Physical Exam: Filed Vitals:   03/04/15 0445 03/04/15 0500 03/04/15 0530 03/04/15 0545  BP: 168/70 141/65 131/54 130/50  Pulse: 71 78 81 80  Temp:      TempSrc:      Resp: Height:      Weight:      SpO2: 95% 95% 91% 92%   General: In moderate  acute distress HEENT:        Eyes: PERRL, EOMI, no scleral icterus       ENT: No discharge from the ears and nose, no pharynx injection, no tonsillar enlargement.        Neck: No JVD, no bruit, no mass felt. Cardiac: S1/S2, RRR, No murmurs, No gallops or rubs Pulm: Diffused rhonchi and wheezing bilaterally Abd: Soft, nondistended, nontender, no rebound pain, no organomegaly, BS present Ext: trace leg edema bilaterally. 2+DP/PT pulse bilaterally Musculoskeletal: No joint deformities, erythema, or stiffness, ROM full Skin: No rashes.  Neuro: Alert and oriented X3, cranial nerves II-XII grossly intact, muscle strength 5/5 in all extremeties, sensation to light touch intact. Psych: Patient is not psychotic, no suicidal or  hemocidal ideation.  Labs on Admission:  Basic Metabolic Panel:  Recent Labs Lab 03/04/15 0402  NA 144  K 3.8  CL 114*  CO2 21  GLUCOSE 110*  BUN 19  CREATININE 1.32  CALCIUM 8.7   Liver Function Tests:  Recent Labs Lab 03/04/15 0402  AST 19  ALT 14  ALKPHOS 76  BILITOT 0.6  PROT 6.0  ALBUMIN 3.6   No results for input(s): LIPASE, AMYLASE in the last 168 hours. No results for input(s): AMMONIA in the last 168 hours. CBC:  Recent Labs Lab 03/04/15 0402  WBC 8.5  NEUTROABS 4.0  HGB 15.2  HCT 47.5  MCV 94.4  PLT 165   Cardiac Enzymes: No results for input(s): CKTOTAL, CKMB, CKMBINDEX, TROPONINI in the last 168 hours.  BNP (last 3 results)  Recent Labs  12/23/14 1948 03/04/15 0402  BNP 126.1* 122.9*    ProBNP (last 3 results) No results for input(s): PROBNP in the last 8760 hours.  CBG: No results for input(s): GLUCAP in the last 168 hours.  Radiological Exams on Admission: Dg Chest Port 1 View  03/04/2015   CLINICAL DATA:  Shortness of breath for 1 day.  EXAM: PORTABLE CHEST - 1 VIEW  COMPARISON:  Frontal and lateral views 01/10/2015  FINDINGS: Dual lead left-sided pacemaker remains in place. Patient is post median sternotomy and CABG. There is stable  cardiomegaly. Increased bibasilar atelectasis. Mild hyperinflation again seen. Mild prominence of central pulmonary vasculature without overt edema. No large pleural effusion or pneumothorax. No acute osseous abnormalities.  IMPRESSION: Increased bibasilar atelectasis.  Stable mild cardiomegaly.   Electronically Signed   By: Rubye Oaks M.D.   On: 03/04/2015 04:24    EKG: Independently reviewed. QTc=512, old left bundle blockage  Assessment/Plan Principal Problem:   COPD exacerbation Active Problems:   HYPERCHOLESTEROLEMIA, PURE   HYPERTENSION, BENIGN   CORONARY ATHEROSCLEROSIS NATIVE CORONARY ARTERY   Atrial fibrillation   ILIAC ARTERY ANEURYSM   Long term current use of anticoagulant   Abdominal aortic aneurysm   Chronic systolic heart failure   Automatic implantable cardioverter-defibrillator in situ   Stroke   HLD (hyperlipidemia)   Acute respiratory failure with hypoxia   CKD (chronic kidney disease), stage II  Acute respiratory failure and COPD exacerbation: Patient's SOB is most likely caused by COPD exacerbation given productive cough, and diffuse wheezing and rhonchi. Chest x-ray has no infiltration. Patient is on Ranexa for Afib, making the pulmonary embolism less likely. CHF seems to be compensated. -will admit patient to telemetry bed  -Nebulizers: scheduled Duoneb and prn albuterol -Solu-Medrol 60 mg IV q8h -Levaquin for 5 days (patient received 1 dose of IV Levaquin, followed by oral starting tomorrow)  -Mucinex for cough  -Blood and sputum culture, respiratory viral panel -Urine legionella and S. pneumococcal antigen -Follow up blood culture x2, sputum culture, respiratory virus panel, Flu pcr  Combined systolic and diastolic congestive heart failure: 2-D echo 12/25/14 showed EF 40-45% with grade 1 diastolic dysfunction. Patient is on Lasix when necessary. CHF is compensated on admission. BNP 122.9. Patient has only trace amount of leg edema. -Lasix prn, lasix 40  mg x 1 -Continue metoprolol  PAF: CHA2DS2-VASc Score is 6, need oral anticoagulation. Patient is on Pradaxa at home. No bleeding tendency. Heart rate is well controlled. -Continue amiodarone, metoprolol -Continue Pradaxa  Hypertension:  -continue amlodipine, hydralazine, lisinopril, metoprolol - On Lasix when necessary  Hyperlipidemia: LDL was a 75 on 12/24/14 -Continue pravastatin  CKD-II: Creatinine stable. -Follow-up renal  function at [facility]  CAD: No testing. Troponin negative. -Control blood pressure -continue treatment for hyperlipidemia as above  History of stroke: No new issues. --Control blood pressure -continue treatment for hyperlipidemia as above -Patient is on Pradaxa   DVT ppx: SCD  Code Status: Full code Family Communication: None at bed side.  Disposition Plan: Admit to inpatient   Date of Service 03/04/2015    Lorretta Harp [facility] Pager [phone]  If 7PM-7AM, please contact night-coverage [email or web address] Password [facility] 03/04/2015, 6:36 AM

## 2015-03-04 NOTE — ED Notes (Signed)
Admitting MD at bedside.

## 2015-03-05 LAB — COMPREHENSIVE METABOLIC PANEL
ALT: 13 U/L (ref 0–53)
ANION GAP: 7 (ref 5–15)
AST: 18 U/L (ref 0–37)
Albumin: 3.3 g/dL — ABNORMAL LOW (ref 3.5–5.2)
Alkaline Phosphatase: 64 U/L (ref 39–117)
BILIRUBIN TOTAL: 0.5 mg/dL (ref 0.3–1.2)
BUN: 24 mg/dL — ABNORMAL HIGH (ref 6–23)
CHLORIDE: 111 mmol/L (ref 96–112)
CO2: 23 mmol/L (ref 19–32)
CREATININE: 1.3 mg/dL (ref 0.50–1.35)
Calcium: 8.5 mg/dL (ref 8.4–10.5)
GFR calc non Af Amer: 53 mL/min — ABNORMAL LOW (ref >90)
GFR, EST AFRICAN AMERICAN: 62 mL/min — AB (ref >90)
GLUCOSE: 152 mg/dL — AB (ref 70–99)
Potassium: 3.7 mmol/L (ref 3.5–5.1)
Sodium: 141 mmol/L (ref 135–145)
Total Protein: 5.9 g/dL — ABNORMAL LOW (ref 6.0–8.3)

## 2015-03-05 LAB — CBC WITH DIFFERENTIAL/PLATELET
Basophils Absolute: 0 10*3/uL (ref 0.0–0.1)
Basophils Relative: 0 % (ref 0–1)
EOS ABS: 0 10*3/uL (ref 0.0–0.7)
Eosinophils Relative: 0 % (ref 0–5)
HCT: 42.7 % (ref 39.0–52.0)
Hemoglobin: 13.8 g/dL (ref 13.0–17.0)
LYMPHS ABS: 0.9 10*3/uL (ref 0.7–4.0)
LYMPHS PCT: 8 % — AB (ref 12–46)
MCH: 30.6 pg (ref 26.0–34.0)
MCHC: 32.3 g/dL (ref 30.0–36.0)
MCV: 94.7 fL (ref 78.0–100.0)
Monocytes Absolute: 0.3 10*3/uL (ref 0.1–1.0)
Monocytes Relative: 3 % (ref 3–12)
NEUTROS PCT: 89 % — AB (ref 43–77)
Neutro Abs: 10.2 10*3/uL — ABNORMAL HIGH (ref 1.7–7.7)
Platelets: 156 10*3/uL (ref 150–400)
RBC: 4.51 MIL/uL (ref 4.22–5.81)
RDW: 14.9 % (ref 11.5–15.5)
WBC: 11.4 10*3/uL — AB (ref 4.0–10.5)

## 2015-03-05 LAB — STREP PNEUMONIAE URINARY ANTIGEN: STREP PNEUMO URINARY ANTIGEN: NEGATIVE

## 2015-03-05 MED ORDER — MAGNESIUM SULFATE IN D5W 10-5 MG/ML-% IV SOLN
1.0000 g | Freq: Once | INTRAVENOUS | Status: AC
  Administered 2015-03-05: 1 g via INTRAVENOUS
  Filled 2015-03-05: qty 100

## 2015-03-05 MED ORDER — POTASSIUM CHLORIDE CRYS ER 20 MEQ PO TBCR
40.0000 meq | EXTENDED_RELEASE_TABLET | Freq: Once | ORAL | Status: AC
  Administered 2015-03-05: 40 meq via ORAL
  Filled 2015-03-05: qty 2

## 2015-03-05 MED ORDER — METOPROLOL SUCCINATE ER 25 MG PO TB24
25.0000 mg | ORAL_TABLET | Freq: Every day | ORAL | Status: DC
  Administered 2015-03-06: 25 mg via ORAL
  Filled 2015-03-05: qty 1

## 2015-03-05 NOTE — Evaluation (Signed)
Occupational Therapy Evaluation Patient Details Name: ARGIL HANDLEY MRN: [id number] DOB: 09-01-1941 Today's Date: 03/18/15    History of Present Illness Pt adm with acute on chronic hypoxic respiratory failure due to COPD exacerbation. PMH - BPPV, HTN, A-fib, CAD, AICD, CVA (recent)   Clinical Impression   PTA pt lived at home and was independent with ADLs. Pt currently required Mod I for ADLs with increased time and is limited by cardiopulmonary status. Pt has stepdaughter at home to assist with ADLs and has no further acute OT needs.     Follow Up Recommendations  No OT follow up    Equipment Recommendations  None recommended by OT    Recommendations for Other Services       Precautions / Restrictions Precautions Precautions: Fall Restrictions Weight Bearing Restrictions: No      Mobility Bed Mobility Overal bed mobility: Modified Independent                Transfers Overall transfer level: Modified independent   Transfers: Sit to/from Stand Sit to Stand: Modified independent (Device/Increase time)                   ADL Overall ADL's : Modified independent                                       General ADL Comments: Increased time for ADLs and pt was able to reach Bil feet for LB ADLs. Has stepdaughter at home to assist with IADLs. Fall prevention education completed.      Vision Vision Assessment?: No apparent visual deficits          Pertinent Vitals/Pain Pain Assessment: No/denies pain     Hand Dominance Right   Extremity/Trunk Assessment Upper Extremity Assessment Upper Extremity Assessment: Overall WFL for tasks assessed   Lower Extremity Assessment Lower Extremity Assessment: Defer to PT evaluation   Cervical / Trunk Assessment Cervical / Trunk Assessment: Normal   Communication Communication Communication: No difficulties   Cognition Arousal/Alertness: Awake/alert Behavior During Therapy: WFL for  tasks assessed/performed Overall Cognitive Status: Within Functional Limits for tasks assessed                                Home Living Family/patient expects to be discharged to:: Private residence Living Arrangements: Other (Comment) (stepdaughter) Available Help at Discharge: Family;Available PRN/intermittently Type of Home: Mobile home Home Access: Stairs to enter Entrance Stairs-Number of Steps: 2 Entrance Stairs-Rails: Can reach both Home Layout: One level     Bathroom Shower/Tub: Tub/shower unit;Curtain   Firefighter: Standard     Home Equipment: Environmental consultant - 2 wheels;Grab bars - tub/shower;Wheelchair - manual;Cane - single point;Other (comment);Bedside commode;Tub bench          Prior Functioning/Environment Level of Independence: Independent        Comments: Used walker upon initially returning home after last hospitalization but able to go without assistive device after OPPT    OT Diagnosis: Generalized weakness;Acute pain    End of Session  Activity Tolerance: Patient tolerated treatment well Patient left: Other (comment);with call bell/phone within reach (sitting EOB)   Time: 5397-6734 OT Time Calculation (min): 13 min Charges:  OT General Charges $OT Visit: 1 Procedure OT Evaluation $Initial OT Evaluation Tier I: 1 Procedure G-Codes:    Rae Lips 2015/03/18, 4:22 PM  Cyndie Chime, OTR/L Occupational Therapist [phone] (pager)

## 2015-03-05 NOTE — Progress Notes (Signed)
Physical Therapy Treatment Patient Details Name: [name] [name] MRN: [id number] DOB: June 18, 1941 Today's Date: 03/05/2015    History of Present Illness Pt adm with acute on chronic hypoxic respiratory failure due to COPD exacerbation. PMH - BPPV, HTN, A-fib, CAD, AICD, CVA (recent)    PT Comments    Progressing steadily with mobility.  Noticeable SOB with exertion, but quick to recover with rest.  See oxygen saturation values.  Follow Up Recommendations  Outpatient PT     Equipment Recommendations  None recommended by PT    Recommendations for Other Services       Precautions / Restrictions Precautions Precautions: Fall Restrictions Weight Bearing Restrictions: No    Mobility  Bed Mobility Overal bed mobility: Modified Independent                Transfers Overall transfer level: Modified independent   Transfers: Sit to/from Stand Sit to Stand: Modified independent (Device/Increase time)            Ambulation/Gait Ambulation/Gait assistance: Supervision Ambulation Distance (Feet): 400 Feet Assistive device: None Gait Pattern/deviations: Step-through pattern Gait velocity: prefers slower, but pt can speed up as needed to cuing   General Gait Details: generally steady gait   Stairs Stairs: Yes Stairs assistance: Min guard Stair Management: One rail Right;Alternating pattern;Forwards Number of Stairs: 4 General stair comments: Rushed too much to appear steady on steps, but used rail and needed no assist  Wheelchair Mobility    Modified Rankin (Stroke Patients Only)       Balance Overall balance assessment: Needs assistance   Sitting balance-Leahy Scale: Normal       Standing balance-Leahy Scale: Good               High level balance activites: Backward walking;Direction changes;Turns;Head turns High Level Balance Comments: some mild unsteadiness with backing up, scanning behind and abrupt changes, but no LOB.    Cognition  Arousal/Alertness: Awake/alert Behavior During Therapy: WFL for tasks assessed/performed Overall Cognitive Status: Within Functional Limits for tasks assessed                      Exercises      General Comments General comments (skin integrity, edema, etc.): Sats at rest 92% on RA, HR 76 bpm.  With ambulation sats dropped to 88%  (with resp mask on) and EHR 114.  Similar numbers with walking and stairs at 91% on RA and EHR at 115 bpm.  At rest end of session on RA 91/92% with HR in 90's bpm.  No values charted on oxygen.      Pertinent Vitals/Pain Pain Assessment: No/denies pain    Home Living                      Prior Function            PT Goals (current goals can now be found in the care plan section) Acute Rehab PT Goals Patient Stated Goal: Return home and to prior level of function PT Goal Formulation: With patient Time For Goal Achievement: 03/11/15 Potential to Achieve Goals: Good Progress towards PT goals: Progressing toward goals    Frequency  Min 3X/week    PT Plan Current plan remains appropriate    Co-evaluation             End of Session   Activity Tolerance: Patient limited by fatigue Patient left: in bed;with call bell/phone within reach     Time: 4098-1191  PT Time Calculation (min) (ACUTE ONLY): 18 min  Charges:  $Gait Training: 8-22 mins                    G Codes:      Aleka Twitty, Eliseo Gum 03/05/2015, 1:43 PM  03/05/2015  Mapleton Bing, PT [phone]  (pager)

## 2015-03-05 NOTE — Progress Notes (Addendum)
Patient Demographics  [name] [name], is a 74 y.o. male, DOB - [birth year], ZOX:[id number]  Admit date - 03/04/2015   Admitting Physician Lorretta Harp, MD  Outpatient Primary MD for the patient is [name], MD  LOS - 1   Chief Complaint  Patient presents with  . Shortness of Breath        Subjective:   [name] [name] today has, No headache, No chest pain, No abdominal pain - No Nausea, No new weakness tingling or numbness, Improved Cough & SOB.    Assessment & Plan    1. Acute on chronic hypoxic respiratory failure due to COPD exacerbation. Much improved today on IV steroids H will be continued along with empiric Levaquin, chest x-ray nonacute, continue scheduled and as needed nebulizer treatments. Continue oxygen supplementation and monitor. -ve influenza panel and strep pneumococcal antigen. Pending legionella antigen   2. 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 Currently compensated from this standpoint, no evidence of fluid overload, continue ACE inhibitor, low-dose beta blocker and as needed Lasix. Monitor weight daily.   3. Proximal atrial fibrillation. CHA2DS2-VASc Score is 6 - on amiodarone, beta blocker along with Pradaxa which will be continued. No acute issues.   4. CAD with Chronic left bundle branch block. Stable. Continue beta blocker and Pradaxa for secondary prevention along with statin.   5. Dyslipidemia. On statin continue   6. Essential hypertension. Continue Norvasc, hydralazine and low-dose beta blocker.    7.Hx of stroke. On Pradaxa and statin for secondary prevention.    8. 5.45pm - patient had asymptomatic 6 beat run of VT, EF 45%, B blocker increased, 1gm IV Mag and Kdur (k borderline), tele monitor.       Code Status:  Full  Family Communication: None  Disposition Plan: Home   Procedures None   Consults   None   Medications  Scheduled Meds: . amiodarone  200 mg Oral Daily  . amLODipine  10 mg Oral Daily  . dabigatran  150 mg Oral BID  . dextromethorphan-guaiFENesin  1 tablet Oral BID  . docusate sodium  100 mg Oral Q12H  . hydrALAZINE  25 mg Oral 3 times per day  . ipratropium-albuterol  3 mL Nebulization Q4H  . levofloxacin  500 mg Oral Daily  . lisinopril  40 mg Oral Daily  . methylPREDNISolone (SOLU-MEDROL) injection  60 mg Intravenous 3 times per day  . metoprolol succinate  12.5 mg Oral Daily  . multivitamin with minerals  1 tablet Oral Daily  . niacin  1,000 mg Oral QHS  . pravastatin  80 mg Oral q1800   Continuous Infusions:  PRN Meds:.albuterol  DVT Prophylaxis  Pradexa  Lab Results  Component Value Date   PLT 156 03/05/2015    Antibiotics     Anti-infectives    Start     Dose/Rate Route Frequency Ordered Stop   03/05/15 1000  azithromycin (ZITHROMAX) tablet 250 mg  Status:  Discontinued     250 mg Oral Daily 03/04/15 0559 03/04/15 0646   03/05/15 1000  levofloxacin (LEVAQUIN) tablet 500 mg     500 mg Oral Daily 03/04/15 0646     03/04/15 1000  azithromycin (ZITHROMAX) tablet 500 mg  Status:  Discontinued  500 mg Oral Daily 03/04/15 0559 03/04/15 0646   03/04/15 0445  levofloxacin (LEVAQUIN) IVPB 750 mg     750 mg 100 mL/hr over 90 Minutes Intravenous  Once 03/04/15 0433 03/04/15 0646          Objective:   Filed Vitals:   03/04/15 1655 03/05/15 0005 03/05/15 0511 03/05/15 0916  BP:  118/64 130/51   Pulse:   76   Temp: 98.9 F (37.2 C)  97.9 F (36.6 C)   TempSrc: Oral  Oral   Resp:  20 18   Height:      Weight:      SpO2:   94% 93%    Wt Readings from Last 3 Encounters:  03/04/15 101.5 kg (223 lb 12.3 oz)  03/01/15 110.587 kg (243 lb 12.8 oz)  02/05/15 110.269 kg (243 lb 1.6 oz)     Intake/Output Summary (Last 24 hours) at 03/05/15  1035 Last data filed at 03/05/15 0548  Gross per 24 hour  Intake    238 ml  Output    450 ml  Net   -212 ml     Physical Exam  Awake Alert, Oriented X 3, No new F.N deficits, Normal affect [place].AT,PERRAL Supple Neck,No JVD, No cervical lymphadenopathy appriciated.  Symmetrical Chest wall movement, Mod air movement bilaterally, minimal bilateral wheezing RRR,No Gallops,Rubs or new Murmurs, No Parasternal Heave +ve B.Sounds, Abd Soft, No tenderness, No organomegaly appriciated, No rebound - guarding or rigidity. No Cyanosis, Clubbing or edema, No new Rash or bruise      Data Review   Micro Results Recent Results (from the past 240 hour(s))  Culture, blood (routine x 2)     Status: None (Preliminary result)   Collection Time: 03/04/15  4:02 AM  Result Value Ref Range Status   Specimen Description BLOOD LEFT ANTECUBITAL  Final   Special Requests BOTTLES DRAWN AEROBIC AND ANAEROBIC 5CC EACH  Final   Culture   Final           BLOOD CULTURE RECEIVED NO GROWTH TO DATE CULTURE WILL BE HELD FOR 5 DAYS BEFORE ISSUING A FINAL NEGATIVE REPORT Performed at [facility]    Report Status PENDING  Incomplete  Culture, blood (routine x 2)     Status: None (Preliminary result)   Collection Time: 03/04/15  4:12 AM  Result Value Ref Range Status   Specimen Description BLOOD RIGHT HAND  Final   Special Requests BOTTLES DRAWN AEROBIC AND ANAEROBIC 5CC EACH  Final   Culture   Final           BLOOD CULTURE RECEIVED NO GROWTH TO DATE CULTURE WILL BE HELD FOR 5 DAYS BEFORE ISSUING A FINAL NEGATIVE REPORT Performed at [facility]    Report Status PENDING  Incomplete  MRSA PCR Screening     Status: None   Collection Time: 03/04/15  9:27 AM  Result Value Ref Range Status   MRSA by PCR NEGATIVE NEGATIVE Final    Comment:        The GeneXpert MRSA Assay (FDA approved for NASAL specimens only), is one component of a comprehensive MRSA colonization surveillance program. It is  not intended to diagnose MRSA infection nor to guide or monitor treatment for MRSA infections.     Radiology Reports Dg Chest Port 1 View  03/04/2015   CLINICAL DATA:  Shortness of breath for 1 day.  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 There is stable cardiomegaly. Increased bibasilar atelectasis. Mild hyperinflation again seen. Mild prominence of central pulmonary vasculature without overt edema. No large pleural effusion or pneumothorax. No acute osseous abnormalities.  IMPRESSION: Increased bibasilar atelectasis.  Stable mild cardiomegaly.   Electronically Signed   By: Rubye Oaks M.D.   On: 03/04/2015 04:24     CBC  Recent Labs Lab 03/04/15 0402 03/05/15 0804  WBC 8.5 11.4*  HGB 15.2 13.8  HCT 47.5 42.7  PLT 165 156  MCV 94.4 94.7  MCH 30.2 30.6  MCHC 32.0 32.3  RDW 14.7 14.9  LYMPHSABS 3.3 0.9  MONOABS 0.6 0.3  EOSABS 0.5 0.0  BASOSABS 0.1 0.0    Chemistries   Recent Labs Lab 03/04/15 0402 03/05/15 0804  NA 144 141  K 3.8 3.7  CL 114* 111  CO2 21 23  GLUCOSE 110* 152*  BUN 19 24*  CREATININE 1.32 1.30  CALCIUM 8.7 8.5  AST 19 18  ALT 14 13  ALKPHOS 76 64  BILITOT 0.6 0.5   ------------------------------------------------------------------------------------------------------------------ estimated creatinine clearance is 64.3 mL/min (by C-G formula based on Cr of 1.3). ------------------------------------------------------------------------------------------------------------------ No results for input(s): HGBA1C in the last 72 hours. ------------------------------------------------------------------------------------------------------------------ No results for input(s): CHOL, HDL, LDLCALC, TRIG, CHOLHDL, LDLDIRECT in the last 72  hours. ------------------------------------------------------------------------------------------------------------------ No results for input(s): TSH, T4TOTAL, T3FREE, THYROIDAB in the last 72 hours.  Invalid input(s): FREET3 ------------------------------------------------------------------------------------------------------------------ No results for input(s): VITAMINB12, FOLATE, FERRITIN, TIBC, IRON, RETICCTPCT in the last 72 hours.  Coagulation profile No results for input(s): INR, PROTIME in the last 168 hours.  No results for input(s): DDIMER in the last 72 hours.  Cardiac Enzymes No results for input(s): CKMB, TROPONINI, MYOGLOBIN in the last 168 hours.  Invalid input(s): CK ------------------------------------------------------------------------------------------------------------------ Invalid input(s): POCBNP     Time Spent in minutes  35   Mandisa Persinger K M.D on 03/05/2015 at 10:35 AM  Between 7am to 7pm - Pager - [phone]  After 7pm go to [email or web address] - password [facility]  [facility]   Office  [phone]

## 2015-03-05 NOTE — Progress Notes (Signed)
Pt had 6 beat run of vtach - MD aware

## 2015-03-05 NOTE — Progress Notes (Addendum)
SATURATION QUALIFICATIONS: (This note is used to comply with regulatory documentation for home oxygen  Patient Saturations on Room Air at Rest = 92%  Patient Saturations on Room Air while Ambulating = 88%,   EHR 114, quick recovery to 91% HR 90's bpm  Patient Saturations while ambulating/stair climbing= 91%  EHR 115, less than 1 min recoverty  Please briefly explain why patient needs home oxygen: Pt hopes to wean off need for oxygen when ambulating by d/c, at this point may need supplement O2 for activity.  Oxygen not available during activity to check SpO2 during ambulation with oxygen. 03/05/2015  [place] Bing, PT [phone]  (pager)

## 2015-03-06 LAB — RESPIRATORY VIRUS PANEL
Adenovirus: NEGATIVE
INFLUENZA B 1: NEGATIVE
Influenza A: NEGATIVE
METAPNEUMOVIRUS: NEGATIVE
Parainfluenza 1: NEGATIVE
Parainfluenza 2: NEGATIVE
Parainfluenza 3: NEGATIVE
Respiratory Syncytial Virus A: NEGATIVE
Respiratory Syncytial Virus B: NEGATIVE
Rhinovirus: NEGATIVE

## 2015-03-06 LAB — BASIC METABOLIC PANEL
Anion gap: 3 — ABNORMAL LOW (ref 5–15)
BUN: 30 mg/dL — ABNORMAL HIGH (ref 6–23)
CHLORIDE: 110 mmol/L (ref 96–112)
CO2: 27 mmol/L (ref 19–32)
Calcium: 8.4 mg/dL (ref 8.4–10.5)
Creatinine, Ser: 1.27 mg/dL (ref 0.50–1.35)
GFR, EST AFRICAN AMERICAN: 63 mL/min — AB (ref >90)
GFR, EST NON AFRICAN AMERICAN: 55 mL/min — AB (ref >90)
Glucose, Bld: 184 mg/dL — ABNORMAL HIGH (ref 70–99)
POTASSIUM: 4 mmol/L (ref 3.5–5.1)
Sodium: 140 mmol/L (ref 135–145)

## 2015-03-06 LAB — MAGNESIUM: Magnesium: 2.4 mg/dL (ref 1.5–2.5)

## 2015-03-06 MED ORDER — PREDNISONE 5 MG PO TABS: ORAL_TABLET | ORAL | Status: DC

## 2015-03-06 MED ORDER — LEVOFLOXACIN 500 MG PO TABS: 500.0000 mg | ORAL_TABLET | Freq: Every day | ORAL | Status: DC

## 2015-03-06 MED ORDER — METOPROLOL SUCCINATE ER 25 MG PO TB24: ORAL_TABLET | ORAL | Status: DC

## 2015-03-06 NOTE — Discharge Summary (Signed)
[name] [name], is a 74 y.o. male  DOB 07/13/1941  MRN [id number].  Admission date:  03/04/2015  Admitting Physician  Lorretta Harp, MD  Discharge Date:  03/06/2015   Primary MD  [name], MD  Recommendations for primary care physician for things to follow:   Check CBC, BMP and a 2 view chest x-ray in one week.   Admission Diagnosis  COPD exacerbation [J44.1]   Discharge Diagnosis  COPD exacerbation [J44.1]    Principal Problem:   COPD exacerbation Active Problems:   HYPERCHOLESTEROLEMIA, PURE   HYPERTENSION, BENIGN   CORONARY ATHEROSCLEROSIS NATIVE CORONARY ARTERY   Atrial fibrillation   ILIAC ARTERY ANEURYSM   Long term current use of anticoagulant   Abdominal aortic aneurysm   Chronic systolic heart failure   Automatic implantable cardioverter-defibrillator in situ   Stroke   HLD (hyperlipidemia)   Acute respiratory failure with hypoxia   CKD (chronic kidney disease), stage II      Past Medical History  Diagnosis Date  . Atrial fibrillation     s/p prior DCCV;  Amiodarone Rx.  . CAD (coronary artery disease)     s/p CABG;  cath 10/07:  LM ok, LAD occluded, AV CFX 95%, pRCA occluded; L-LAD, S-OM2/OM3, S-PDA ok  . Cerebrovascular disease     s/p prior Left CEA;  followed by Dr. [name]  . COPD (chronic obstructive pulmonary disease)   . Hyperlipidemia   . Hypertension   . Mitral insufficiency     hx of. not noted on echo 01/2012  . Nephrolithiasis   . Ischemic cardiomyopathy     echo 5/09: EF 55%, mild LAE;  Myoview 11/10: inf and apical scar with mild peri-infarct ischemia, EF 44%. Echo 01/26/12 with EF 20-25% but previously known to be 30% on echoes before last one  . Iliac artery aneurysm, left     followed by Dr. [name]  . Aortic dissection     H/O focal aortic dissection  . LBBB (left  bundle branch block)   . CHF (congestive heart failure)   . Myocardial infarction   . Renal insufficiency     01/2012  . Stroke   . CVA (cerebral infarction)     Past Surgical History  Procedure Laterality Date  . Uteroscopy    . Coronary artery bypass graft  1998  . Left cea  07/05  . Appendectomy  09/11/11  . Carotid endarterectomy  2005    left  . Biventricular defibrillator implantation  08/22/12    SJM Quadra Assura BiV ICD implanted by Dr [name]  . Spine surgery  Sep 27, 2014    Lower Back L 4  and   L 5  . Bi-ventricular implantable cardioverter defibrillator N/A 08/22/2012    Procedure: BI-VENTRICULAR IMPLANTABLE CARDIOVERTER DEFIBRILLATOR  (CRT-D);  Surgeon: [name], MD;  Location: [facility] CATH LAB;  Service: Cardiovascular;  Laterality: N/A;       History of present illness and  Hospital Course:     Kindly see H&P for history of  present illness and admission details, please review complete Labs, Consult reports and Test reports for all details in brief  HPI  from the history and physical done on the day of admission  [name] [name] is a 74 y.o. male with past medical history of HTN, hyperlipidemia, COPD, atrial fibrillation on Pradaxa, history of stroke, aortic dissection, combined diastolic and systolic congestive heart failure, AICD placement, AAA, left bundle blockage, mitral valve disorder, coronary artery disease, who presents with worsening shortness of breath.   Patient reports that he has worsening shortness of breath in the past several days. He has productive cough, but did not pay attention about the color of sputum. He has sick contacts with his stepdaughter who had cold recently. Patient does not have fever, chills, chest pain. He has CHF and is taking lasix prn. His body weight has been stable. Patient dose not have abdominal pain, diarrhea, constipation, dysuria, urgency, frequency, hematuria, skin rashes. No unilateral weakness, numbness or tingling  sensations. No vision change or hearing loss.    In ED, patient was found to have negative chest x-ray for acute abnormalities. Troponin negative, BNP 122.9, WBC 8.5, electrolytes okay, temperature normal. Patient is admitted to inpatient for further evaluation and treatment.    Hospital Course    1. Acute on chronic hypoxic respiratory failure due to COPD exacerbation. Much improved today after IV steroids along with empiric Levaquin, chest x-ray nonacute,  -ve influenza panel and strep pneumococcal antigen and legionella antigen. Will be placed on steroid taper along with 5 more days of oral Levaquin. Completely symptom-free without any wheezing and shortness of breath at this time.   2. 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 Currently compensated from this standpoint, no evidence of fluid overload, continue ACE inhibitor, low-dose beta blocker and as needed Lasix. Has AICD. Outpatient follow-up with primary cardiologist Dr. [name] post discharge.   3. Proximal atrial fibrillation. CHA2DS2-VASc Score is 6 - on amiodarone, beta blocker along with Pradaxa which will be continued. No acute issues.   4. CAD with Chronic left bundle branch block. Stable. Continue beta blocker and Pradaxa for secondary prevention along with statin.   5. Dyslipidemia. On statin continue   6. Essential hypertension. Continue Norvasc, hydralazine and low-dose beta blocker.    7.Hx of stroke. On Pradaxa and statin for secondary prevention.    8. On 03/06/2015 5.45pm - patient had asymptomatic 6 beat run of VT, EF 45%, B blocker increased on 12-1/2-25 mg daily he was taking half a pill a day, he has AICD. We will follow with Dr. [name]. Discussed with cardiologist on-call doctor Darliss Ridgel.    Discharge Condition: Stable   Follow UP  Follow-up Information    Follow up with GRISSO,GREG, MD. Schedule an appointment as soon as possible for a visit in 1 week.   Specialty:   Internal Medicine   Contact information:   [address] [place] [address] [phone]       Follow up with Olga Millers, MD. Schedule an appointment as soon as possible for a visit in 1 week.   Specialty:  Cardiology   Contact information:   [address] [place] [address] [phone]         Discharge Instructions  and  Discharge Medications     Discharge Instructions    Diet - low sodium heart healthy    Complete by:  As directed      Discharge instructions    Complete  by:  As directed   Follow with Primary MD [name], MD in 7 days   Get CBC, CMP, 2 view Chest X ray checked  by Primary MD next visit.    Activity: As tolerated with Full fall precautions use walker/cane & assistance as needed   Disposition Home     Diet: Heart Healthy    For Heart failure patients - Check your Weight same time everyday, if you gain over 2 pounds, or you develop in leg swelling, experience more shortness of breath or chest pain, call your Primary MD immediately. Follow Cardiac Low Salt Diet and 1.5 lit/day fluid restriction.   On your next visit with your primary care physician please Get Medicines reviewed and adjusted.   Please request your Prim.MD to go over all Hospital Tests and Procedure/Radiological results at the follow up, please get all Hospital records sent to your Prim MD by signing hospital release before you go home.   If you experience worsening of your admission symptoms, develop shortness of breath, life threatening emergency, suicidal or homicidal thoughts you must seek medical attention immediately by calling 911 or calling your MD immediately  if symptoms less severe.  You Must read complete instructions/literature along with all the possible adverse reactions/side effects for all the Medicines you take and that have been prescribed to you. Take any new Medicines after you have completely understood and accpet all the possible adverse  reactions/side effects.   Do not drive, operating heavy machinery, perform activities at heights, swimming or participation in water activities or provide baby sitting services if your were admitted for syncope or siezures until you have seen by Primary MD or a Neurologist and advised to do so again.  Do not drive when taking Pain medications.    Do not take more than prescribed Pain, Sleep and Anxiety Medications  Special Instructions: If you have smoked or chewed Tobacco  in the last 2 yrs please stop smoking, stop any regular Alcohol  and or any Recreational drug use.  Wear Seat belts while driving.   Please note  You were cared for by a hospitalist during your hospital stay. If you have any questions about your discharge medications or the care you received while you were in the hospital after you are discharged, you can call the unit and asked to speak with the hospitalist on call if the hospitalist that took care of you is not available. Once you are discharged, your primary care physician will handle any further medical issues. Please note that NO REFILLS for any discharge medications will be authorized once you are discharged, as it is imperative that you return to your primary care physician (or establish a relationship with a primary care physician if you do not have one) for your aftercare needs so that they can reassess your need for medications and monitor your lab values.     Increase activity slowly    Complete by:  As directed             Medication List    TAKE these medications        albuterol 108 (90 BASE) MCG/ACT inhaler  Commonly known as:  PROVENTIL HFA;VENTOLIN HFA  Inhale 2 puffs into the lungs every 4 (four) hours as needed for wheezing.     amiodarone 200 MG tablet  Commonly known as:  PACERONE  TAKE 1 TABLET EVERY DAY     amLODipine 10 MG tablet  Commonly known as:  NORVASC  Take 1  tablet (10 mg total) by mouth daily.     dabigatran 150 MG Caps  capsule  Commonly known as:  PRADAXA  Take 1 capsule (150 mg total) by mouth 2 (two) times daily.     docusate sodium 100 MG capsule  Commonly known as:  COLACE  Take 1 capsule (100 mg total) by mouth every 12 (twelve) hours.     furosemide 40 MG tablet  Commonly known as:  LASIX  Take 40 mg by mouth daily as needed for fluid or edema.     hydrALAZINE 50 MG tablet  Commonly known as:  APRESOLINE  Take 0.5 tablets (25 mg total) by mouth 3 (three) times daily.     ibuprofen 200 MG tablet  Commonly known as:  ADVIL,MOTRIN  Take 400 mg by mouth every 6 (six) hours as needed for mild pain or moderate pain.     ipratropium-albuterol 0.5-2.5 (3) MG/3ML Soln  Commonly known as:  DUONEB  Take 3 mLs by nebulization every 4 (four) hours as needed (for shortness of breath).     levofloxacin 500 MG tablet  Commonly known as:  LEVAQUIN  Take 1 tablet (500 mg total) by mouth daily.     lisinopril 40 MG tablet  Commonly known as:  PRINIVIL,ZESTRIL  TAKE 1 TABLET EVERY DAY     metoprolol succinate 25 MG 24 hr tablet  Commonly known as:  TOPROL-XL  Take 1 tablet (25 mg total) by mouth daily.     multivitamin with minerals Tabs tablet  Take 1 tablet by mouth daily.     niacin 500 MG CR tablet  Commonly known as:  NIASPAN  Take 1,000 mg by mouth at bedtime.     potassium chloride SA 20 MEQ tablet  Commonly known as:  K-DUR,KLOR-CON  TAKE 2 TABLETS THREE TIMES DAILY     pravastatin 80 MG tablet  Commonly known as:  PRAVACHOL  Take 1 tablet (80 mg total) by mouth daily.     predniSONE 5 MG tablet  Commonly known as:  DELTASONE  Label  & dispense according to the schedule below. 10 Pills PO for 3 days then, 8 Pills PO for 3 days, 6 Pills PO for 3 days, 4 Pills PO for 3 days, 2 Pills PO for 3 days, 1 Pills PO for 3 days, 1/2 Pill  PO for 3 days then STOP. Total 95 pills.          Diet and Activity recommendation: See Discharge Instructions above   Consults obtained -  cardiologist Dr. Darliss Ridgel over the phone   Major procedures and Radiology Reports - PLEASE review detailed and final reports for all details, in brief -   TTE  - Left ventricle: The cavity size was severely dilated. Wall thickness was increased in a pattern of mild LVH. Systolic function was mildly to moderately reduced. The estimated ejection fraction was in the range of 40% to 45%. Diffuse hypokinesis. There is akinesis of the inferior myocardium. Doppler parameters are consistent with abnormal left ventricular relaxation (grade 1diastolic dysfunction). - Left atrium: The atrium was moderately dilated.   Dg Chest Port 1 View  03/04/2015   CLINICAL DATA:  Shortness of breath for 1 day.  EXAM: PORTABLE CHEST - 1 VIEW  COMPARISON:  Frontal and lateral views 01/10/2015  FINDINGS: Dual lead left-sided pacemaker remains in place. Patient is post median sternotomy and CABG. There is stable cardiomegaly. Increased bibasilar atelectasis. Mild hyperinflation again seen. Mild prominence of central pulmonary vasculature without  overt edema. No large pleural effusion or pneumothorax. No acute osseous abnormalities.  IMPRESSION: Increased bibasilar atelectasis.  Stable mild cardiomegaly.   Electronically Signed   By: Rubye Oaks M.D.   On: 03/04/2015 04:24    Micro Results      Recent Results (from the past 240 hour(s))  Culture, blood (routine x 2)     Status: None (Preliminary result)   Collection Time: 03/04/15  4:02 AM  Result Value Ref Range Status   Specimen Description BLOOD LEFT ANTECUBITAL  Final   Special Requests BOTTLES DRAWN AEROBIC AND ANAEROBIC 5CC EACH  Final   Culture   Final           BLOOD CULTURE RECEIVED NO GROWTH TO DATE CULTURE WILL BE HELD FOR 5 DAYS BEFORE ISSUING A FINAL NEGATIVE REPORT Performed at [facility]    Report Status PENDING  Incomplete  Culture, blood (routine x 2)     Status: None (Preliminary result)   Collection Time: 03/04/15  4:12 AM    Result Value Ref Range Status   Specimen Description BLOOD RIGHT HAND  Final   Special Requests BOTTLES DRAWN AEROBIC AND ANAEROBIC 5CC EACH  Final   Culture   Final           BLOOD CULTURE RECEIVED NO GROWTH TO DATE CULTURE WILL BE HELD FOR 5 DAYS BEFORE ISSUING A FINAL NEGATIVE REPORT Performed at [facility]    Report Status PENDING  Incomplete  MRSA PCR Screening     Status: None   Collection Time: 03/04/15  9:27 AM  Result Value Ref Range Status   MRSA by PCR NEGATIVE NEGATIVE Final    Comment:        The GeneXpert MRSA Assay (FDA approved for NASAL specimens only), is one component of a comprehensive MRSA colonization surveillance program. It is not intended to diagnose MRSA infection nor to guide or monitor treatment for MRSA infections.        Today   Subjective:   Talha Iser today has no headache,no chest abdominal pain,no new weakness tingling or numbness, feels much better wants to go home today.    Objective:   Blood pressure 137/58, pulse 70, temperature 98.1 F (36.7 C), temperature source Oral, resp. rate 18, height 6\' 1"  (1.854 m), weight 101.5 kg (223 lb 12.3 oz), SpO2 94 %.   Intake/Output Summary (Last 24 hours) at 03/06/15 1038 Last data filed at 03/06/15 0537  Gross per 24 hour  Intake    998 ml  Output    200 ml  Net    798 ml    Exam Awake Alert, Oriented x 3, No new F.N deficits, Normal affect [place].AT,PERRAL Supple Neck,No JVD, No cervical lymphadenopathy appriciated.  Symmetrical Chest wall movement, Good air movement bilaterally, CTAB RRR,No Gallops,Rubs or new Murmurs, No Parasternal Heave +ve B.Sounds, Abd Soft, Non tender, No organomegaly appriciated, No rebound -guarding or rigidity. No Cyanosis, Clubbing or edema, No new Rash or bruise  Data Review   CBC w Diff: Lab Results  Component Value Date   WBC 11.4* 03/05/2015   HGB 13.8 03/05/2015   HCT 42.7 03/05/2015   PLT 156 03/05/2015   LYMPHOPCT 8* 03/05/2015    MONOPCT 3 03/05/2015   EOSPCT 0 03/05/2015   BASOPCT 0 03/05/2015    CMP: Lab Results  Component Value Date   NA 140 03/06/2015   K 4.0 03/06/2015   CL 110 03/06/2015   CO2 27 03/06/2015  BUN 30* 03/06/2015   CREATININE 1.27 03/06/2015   CREATININE 1.24 02/05/2015   PROT 5.9* 03/05/2015   ALBUMIN 3.3* 03/05/2015   BILITOT 0.5 03/05/2015   ALKPHOS 64 03/05/2015   AST 18 03/05/2015   ALT 13 03/05/2015  .   Total Time in preparing paper work, data evaluation and todays exam - 35 minutes  Leroy Sea M.D on 03/06/2015 at 10:38 AM  [facility]   Office  [phone]

## 2015-03-06 NOTE — Discharge Instructions (Signed)
Follow with Primary MD [name], MD in 7 days   Get CBC, CMP, 2 view Chest X ray checked  by Primary MD next visit.    Activity: As tolerated with Full fall precautions use walker/cane & assistance as needed   Disposition Home     Diet: Heart Healthy    For Heart failure patients - Check your Weight same time everyday, if you gain over 2 pounds, or you develop in leg swelling, experience more shortness of breath or chest pain, call your Primary MD immediately. Follow Cardiac Low Salt Diet and 1.5 lit/day fluid restriction.   On your next visit with your primary care physician please Get Medicines reviewed and adjusted.   Please request your Prim.MD to go over all Hospital Tests and Procedure/Radiological results at the follow up, please get all Hospital records sent to your Prim MD by signing hospital release before you go home.   If you experience worsening of your admission symptoms, develop shortness of breath, life threatening emergency, suicidal or homicidal thoughts you must seek medical attention immediately by calling 911 or calling your MD immediately  if symptoms less severe.  You Must read complete instructions/literature along with all the possible adverse reactions/side effects for all the Medicines you take and that have been prescribed to you. Take any new Medicines after you have completely understood and accpet all the possible adverse reactions/side effects.   Do not drive, operating heavy machinery, perform activities at heights, swimming or participation in water activities or provide baby sitting services if your were admitted for syncope or siezures until you have seen by Primary MD or a Neurologist and advised to do so again.  Do not drive when taking Pain medications.    Do not take more than prescribed Pain, Sleep and Anxiety Medications  Special Instructions: If you have smoked or chewed Tobacco  in the last 2 yrs please stop smoking, stop any regular  Alcohol  and or any Recreational drug use.  Wear Seat belts while driving.   Please note  You were cared for by a hospitalist during your hospital stay. If you have any questions about your discharge medications or the care you received while you were in the hospital after you are discharged, you can call the unit and asked to speak with the hospitalist on call if the hospitalist that took care of you is not available. Once you are discharged, your primary care physician will handle any further medical issues. Please note that NO REFILLS for any discharge medications will be authorized once you are discharged, as it is imperative that you return to your primary care physician (or establish a relationship with a primary care physician if you do not have one) for your aftercare needs so that they can reassess your need for medications and monitor your lab values.

## 2015-03-08 ENCOUNTER — Telehealth: Payer: Self-pay | Admitting: Cardiology

## 2015-03-08 LAB — LEGIONELLA ANTIGEN, URINE

## 2015-03-08 MED ORDER — DABIGATRAN ETEXILATE MESYLATE 150 MG PO CAPS: 150.0000 mg | ORAL_CAPSULE | Freq: Two times a day (BID) | ORAL | Status: DC

## 2015-03-08 NOTE — Telephone Encounter (Signed)
°  1. Which medications need to be refilled? Pradaxa  2. Which pharmacy is medication to be sent to?[facility] pharmacy  3. Do they need a 30 day or 90 day supply? Not specified   4. Would they like a call back once the medication has been sent to the pharmacy? yes

## 2015-03-08 NOTE — Telephone Encounter (Signed)
Spoke with pt, refill sent to the pharmacy. 

## 2015-03-10 ENCOUNTER — Telehealth: Payer: Self-pay | Admitting: *Deleted

## 2015-03-10 LAB — CULTURE, BLOOD (ROUTINE X 2)
CULTURE: NO GROWTH
CULTURE: NO GROWTH

## 2015-03-10 MED ORDER — FUROSEMIDE 40 MG PO TABS: 40.0000 mg | ORAL_TABLET | Freq: Every day | ORAL | Status: DC

## 2015-03-10 NOTE — Telephone Encounter (Signed)
Spoke with pt, he was told he had pneumonia, COPD and heart failure in the hosp. Currently he reports his breathing is better than previous. He denies orthopnea or edema. He reports SOB occ with walking in the home and at times while talking on the phone but it is not consistent. His weight is 243 lb, he is not sure of his dry weight. He currently takes his furosemide every other day, encouraged pt to take daily for the next couple days. Will forward for dr [name] review

## 2015-03-10 NOTE — Telephone Encounter (Signed)
Agree [name] [name]  

## 2015-03-10 NOTE — Telephone Encounter (Signed)
Spoke with pt, Aware of dr Ludwig Clarks recommendations.  He is seeing his PCP tomorrow and he will call if they feel he needs to be seen.

## 2015-03-15 ENCOUNTER — Ambulatory Visit (INDEPENDENT_AMBULATORY_CARE_PROVIDER_SITE_OTHER): Payer: Medicare Other | Admitting: *Deleted

## 2015-03-15 DIAGNOSIS — Z95810 Presence of automatic (implantable) cardiac defibrillator: Secondary | ICD-10-CM

## 2015-03-15 DIAGNOSIS — I5022 Chronic systolic (congestive) heart failure: Secondary | ICD-10-CM

## 2015-03-16 ENCOUNTER — Telehealth: Payer: Self-pay | Admitting: Cardiology

## 2015-03-16 NOTE — Telephone Encounter (Signed)
Spoke with pt, [employer] to send paperwork for tier exception on pradaxa. Samples of pradaxa given.

## 2015-03-16 NOTE — Telephone Encounter (Signed)
Received a call from patient he stated he wanted to speak to Stanton Kidney about pradaxa.Stated she already knows about this.Advised Stanton Kidney is in clinic this afternoon, will send message to her.

## 2015-03-18 ENCOUNTER — Encounter: Payer: Self-pay | Admitting: *Deleted

## 2015-03-18 NOTE — Progress Notes (Signed)
EPIC Encounter for ICM Monitoring  Patient Name: TYMEEK [name] is a 74 y.o. male Date: 03/18/2015 Primary Care Physican: [name], MD Primary Cardiologist: [name] Electrophysiologist: Stephens Shire Weight: 238 lbs  Bi-V pacing: 98%       In the past month, have you:  1. Gained more than 2 pounds in a day or more than 5 pounds in a week? no  2. Had changes in your medications (with verification of current medications)? no  3. Had more shortness of breath than is usual for you? no  4. Limited your activity because of shortness of breath? no  5. Not been able to sleep because of shortness of breath? no  6. Had increased swelling in your feet or ankles? no  7. Had symptoms of dehydration (dizziness, dry mouth, increased thirst, decreased urine output) no  8. Had changes in sodium restriction? no  9. Been compliant with medication? Yes   ICM trend:   Follow-up plan: ICM clinic phone appointment: 05/06/15. The patient's impedence was below baseline from ~ 3/24-3/28. He was hospitalized from 3/24-3/26 for SOB and a productive cough. Discharge diagnosis was a COPD exacerbation. He followed up with his PCP today and no changes were made at that visit.   Copy of note sent to patient's primary care physician, primary cardiologist, and device following physician.  Sherri Rad, RN, BSN 03/18/2015 4:31 PM

## 2015-03-22 ENCOUNTER — Encounter: Payer: Self-pay | Admitting: *Deleted

## 2015-03-25 ENCOUNTER — Telehealth: Payer: Self-pay | Admitting: Cardiology

## 2015-03-25 NOTE — Telephone Encounter (Signed)
[name] [name] called in stating that the appeal for the pt's Pradaxa was received and will be processed in the next 72 hours to 7 days.   Thanks

## 2015-03-26 NOTE — Telephone Encounter (Signed)
He wants you to know that the Pradaxa was approved.

## 2015-04-12 ENCOUNTER — Other Ambulatory Visit: Payer: Self-pay | Admitting: Cardiology

## 2015-04-16 ENCOUNTER — Encounter: Payer: Self-pay | Admitting: Family

## 2015-04-20 ENCOUNTER — Ambulatory Visit (INDEPENDENT_AMBULATORY_CARE_PROVIDER_SITE_OTHER): Payer: Medicare Other | Admitting: Family

## 2015-04-20 ENCOUNTER — Encounter: Payer: Self-pay | Admitting: Family

## 2015-04-20 ENCOUNTER — Ambulatory Visit (HOSPITAL_COMMUNITY)
Admission: RE | Admit: 2015-04-20 | Discharge: 2015-04-20 | Disposition: A | Discharge status: Home / Self Care | Payer: Medicare Other | Source: Ambulatory Visit | Attending: Family | Admitting: Family

## 2015-04-20 ENCOUNTER — Ambulatory Visit (INDEPENDENT_AMBULATORY_CARE_PROVIDER_SITE_OTHER)
Admission: RE | Admit: 2015-04-20 | Discharge: 2015-04-20 | Disposition: A | Discharge status: Home / Self Care | Payer: Medicare Other | Source: Ambulatory Visit | Attending: Family | Admitting: Family

## 2015-04-20 VITALS — BP 138/83 | HR 66 | Resp 16 | Ht 73.0 in | Wt 238.0 lb

## 2015-04-20 DIAGNOSIS — Z9889 Other specified postprocedural states: Secondary | ICD-10-CM | POA: Diagnosis not present

## 2015-04-20 DIAGNOSIS — I6523 Occlusion and stenosis of bilateral carotid arteries: Secondary | ICD-10-CM | POA: Insufficient documentation

## 2015-04-20 DIAGNOSIS — I6340 Cerebral infarction due to embolism of unspecified cerebral artery: Secondary | ICD-10-CM

## 2015-04-20 DIAGNOSIS — I714 Abdominal aortic aneurysm, without rupture, unspecified: Secondary | ICD-10-CM

## 2015-04-20 LAB — VAS US AAA DUPLEX
ABAODAP: 4.52 cm
ABAOPAP: 2.53 cm
ABLILTRN: 1.44 cm
ABRILAP: 1.34 cm
Abdominal dist aorta trans: 4.58 cm
Abdominal lt com iliac AP: 1.47 cm
Abdominal lt com iliac vel: 148 cm/s
Abdominal mid aorta AP: 2.78 cm
Abdominal mid aorta trans: 2.84 cm
Abdominal mid aorta vel: 43 cm/s
Abdominal prox aorta trans: 2.59 cm
Abdominal rt com iliac trans: 1.34 cm
Abdominal rt com iliac vel: 119 cm/s

## 2015-04-20 NOTE — Progress Notes (Signed)
[facility] [place] HISTORY AND PHYSICAL   MRN : [id number]  History of Present Illness:   [name] [name] is a 74 y.o. male patient of Dr. [name] who is status post left CEA in 2006. Patient also has a moderate AAA that is being followed with serial duplex. He returns today for follow up of his AAA.  He had L1-4 back surgery for fractures in stages, last surgery was October, 2015, Dr. Clinton Sawyer. The patient denies abdominal pain. He fell on his back 5 days ago,has back pain from this, evaluated at [place], will see his ortho, Dr. Clinton Sawyer.  Pt states he was started on Pradaxa after he had the TIA in about 2011 as manifested by dizziness, blurry vision, slurred speech, and possible tingling in his right side, all symptoms resolved, he had another TIA in early and late January 2016 as manifested by right hemiparesis, had physical therapy, no residual hemiparesis. He denies tingling, numbness, numbness, cold feeling in hands He also has a history of atrial fib.  He denies claudication symptoms with walking, denies non healing wounds.   Pt Diabetic: No Pt smoker: former smoker, quit about 2006  Pt meds include: Statin :Yes Betablocker: Yes ASA: No Other anticoagulants/antiplatelets: Pradaxa for atrial fib    Current Outpatient Prescriptions  Medication Sig Dispense Refill  . albuterol (PROVENTIL HFA;VENTOLIN HFA) 108 (90 BASE) MCG/ACT inhaler Inhale 2 puffs into the lungs every 4 (four) hours as needed for wheezing. 1 Inhaler 0  . amiodarone (PACERONE) 200 MG tablet TAKE 1 TABLET EVERY DAY (Patient taking differently: TAKE 1 TABLET BY MOUTH EVERY DAY) 90 tablet 2  . amLODipine (NORVASC) 10 MG tablet Take 1 tablet (10 mg total) by mouth daily. 90 tablet 4  . dabigatran (PRADAXA) 150 MG CAPS capsule Take 1 capsule (150 mg total) by mouth 2 (two) times daily. 180 capsule 3  . furosemide (LASIX) 40 MG tablet Take 1 tablet (40 mg total) by mouth  daily. 30 tablet 12  . hydrALAZINE (APRESOLINE) 50 MG tablet Take 0.5 tablets (25 mg total) by mouth 3 (three) times daily. 135 tablet 3  . ibuprofen (ADVIL,MOTRIN) 200 MG tablet Take 400 mg by mouth every 6 (six) hours as needed for mild pain or moderate pain.    [name] ipratropium-albuterol (DUONEB) 0.5-2.5 (3) MG/3ML SOLN Take 3 mLs by nebulization every 4 (four) hours as needed (for shortness of breath).    [name] levofloxacin (LEVAQUIN) 500 MG tablet Take 1 tablet (500 mg total) by mouth daily. 4 tablet 0  . lisinopril (PRINIVIL,ZESTRIL) 40 MG tablet TAKE 1 TABLET EVERY DAY 90 tablet 3  . Multiple Vitamin (MULITIVITAMIN WITH MINERALS) TABS Take 1 tablet by mouth daily.    . niacin (NIASPAN) 500 MG CR tablet Take 1,000 mg by mouth at bedtime.     . potassium chloride SA (K-DUR,KLOR-CON) 20 MEQ tablet TAKE 2 TABLETS THREE TIMES DAILY 540 tablet 0  . pravastatin (PRAVACHOL) 80 MG tablet Take 1 tablet (80 mg total) by mouth daily. 90 tablet 2  . docusate sodium (COLACE) 100 MG capsule Take 1 capsule (100 mg total) by mouth every 12 (twelve) hours. (Patient not taking: Reported on 04/20/2015) 60 capsule 0  . metoprolol succinate (TOPROL-XL) 25 MG 24 hr tablet Take 1 pill daily (Patient not taking: Reported on 04/20/2015) 45 tablet 3  . predniSONE (DELTASONE) 5 MG tablet Label  & dispense according to the schedule below. 10 Pills PO for 3 days then, 8 Pills  PO for 3 days, 6 Pills PO for 3 days, 4 Pills PO for 3 days, 2 Pills PO for 3 days, 1 Pills PO for 3 days, 1/2 Pill  PO for 3 days then STOP. Total 95 pills. (Patient not taking: Reported on 04/20/2015) 95 tablet 0   No current facility-administered medications for this visit.    Past Medical History  Diagnosis Date  . Atrial fibrillation     s/p prior DCCV;  Amiodarone Rx.  . CAD (coronary artery disease)     s/p CABG;  cath 10/07:  LM ok, LAD occluded, AV CFX 95%, pRCA occluded; L-LAD, S-OM2/OM3, S-PDA ok  . Cerebrovascular disease     s/p prior Left  CEA;  followed by Dr. [name]  . COPD (chronic obstructive pulmonary disease)   . Hyperlipidemia   . Hypertension   . Mitral insufficiency     hx of. not noted on echo 01/2012  . Nephrolithiasis   . Ischemic cardiomyopathy     echo 5/09: EF 55%, mild LAE;  Myoview 11/10: inf and apical scar with mild peri-infarct ischemia, EF 44%. Echo 01/26/12 with EF 20-25% but previously known to be 30% on echoes before last one  . Iliac artery aneurysm, left     followed by Dr. [name]  . Aortic dissection     H/O focal aortic dissection  . LBBB (left bundle branch block)   . CHF (congestive heart failure)   . Myocardial infarction   . Renal insufficiency     01/2012  . Stroke   . CVA (cerebral infarction)   . DVT (deep venous thrombosis)   . Fall from slipping on wet surface Apr 15, 2015    Fx 3 [place]    Social History History  Substance Use Topics  . Smoking status: Former Smoker -- 1.50 packs/day for 57 years    Types: Cigarettes    Quit date: 12/11/2004  . Smokeless tobacco: Never Used  . Alcohol Use: No     Comment: 6 yrs ago     Family History Family History  Problem Relation Age of Onset  . Stroke Maternal Uncle     Surgical History Past Surgical History  Procedure Laterality Date  . Uteroscopy    . Coronary artery bypass graft  1998  . Left cea  07/05  . Appendectomy  09/11/11  . Biventricular defibrillator implantation  08/22/12    SJM Quadra Assura BiV ICD implanted by Dr [name]  . Spine surgery  Sep 27, 2014    Lower Back L 4  and   L 5  . Bi-ventricular implantable cardioverter defibrillator N/A 08/22/2012    Procedure: BI-VENTRICULAR IMPLANTABLE CARDIOVERTER DEFIBRILLATOR  (CRT-D);  Surgeon: [name], MD;  Location: [facility] CATH LAB;  Service: Cardiovascular;  Laterality: N/A;  . Carotid endarterectomy  July  2005    left    No Known Allergies  Current Outpatient Prescriptions  Medication Sig Dispense Refill  . albuterol (PROVENTIL HFA;VENTOLIN HFA) 108 (90  BASE) MCG/ACT inhaler Inhale 2 puffs into the lungs every 4 (four) hours as needed for wheezing. 1 Inhaler 0  . amiodarone (PACERONE) 200 MG tablet TAKE 1 TABLET EVERY DAY (Patient taking differently: TAKE 1 TABLET BY MOUTH EVERY DAY) 90 tablet 2  . amLODipine (NORVASC) 10 MG tablet Take 1 tablet (10 mg total) by mouth daily. 90 tablet 4  . dabigatran (PRADAXA) 150 MG CAPS capsule Take 1 capsule (150 mg total) by mouth 2 (two) times daily. 180 capsule 3  .  furosemide (LASIX) 40 MG tablet Take 1 tablet (40 mg total) by mouth daily. 30 tablet 12  . hydrALAZINE (APRESOLINE) 50 MG tablet Take 0.5 tablets (25 mg total) by mouth 3 (three) times daily. 135 tablet 3  . ibuprofen (ADVIL,MOTRIN) 200 MG tablet Take 400 mg by mouth every 6 (six) hours as needed for mild pain or moderate pain.    [name] ipratropium-albuterol (DUONEB) 0.5-2.5 (3) MG/3ML SOLN Take 3 mLs by nebulization every 4 (four) hours as needed (for shortness of breath).    [name] levofloxacin (LEVAQUIN) 500 MG tablet Take 1 tablet (500 mg total) by mouth daily. 4 tablet 0  . lisinopril (PRINIVIL,ZESTRIL) 40 MG tablet TAKE 1 TABLET EVERY DAY 90 tablet 3  . Multiple Vitamin (MULITIVITAMIN WITH MINERALS) TABS Take 1 tablet by mouth daily.    . niacin (NIASPAN) 500 MG CR tablet Take 1,000 mg by mouth at bedtime.     . potassium chloride SA (K-DUR,KLOR-CON) 20 MEQ tablet TAKE 2 TABLETS THREE TIMES DAILY 540 tablet 0  . pravastatin (PRAVACHOL) 80 MG tablet Take 1 tablet (80 mg total) by mouth daily. 90 tablet 2  . docusate sodium (COLACE) 100 MG capsule Take 1 capsule (100 mg total) by mouth every 12 (twelve) hours. (Patient not taking: Reported on 04/20/2015) 60 capsule 0  . metoprolol succinate (TOPROL-XL) 25 MG 24 hr tablet Take 1 pill daily (Patient not taking: Reported on 04/20/2015) 45 tablet 3  . predniSONE (DELTASONE) 5 MG tablet Label  & dispense according to the schedule below. 10 Pills PO for 3 days then, 8 Pills PO for 3 days, 6 Pills PO for 3  days, 4 Pills PO for 3 days, 2 Pills PO for 3 days, 1 Pills PO for 3 days, 1/2 Pill  PO for 3 days then STOP. Total 95 pills. (Patient not taking: Reported on 04/20/2015) 95 tablet 0   No current facility-administered medications for this visit.     REVIEW OF SYSTEMS: See HPI for pertinent positives and negatives.  Physical Examination Filed Vitals:   04/20/15 1038 04/20/15 1041  BP: 128/76 138/83  Pulse: 61 66  Resp:  16  Height:   (1.854 m)  Weight:  238 lb (107.956 kg)  SpO2:  95%   Body mass index is 31.41 kg/(m^2).  General: WDWN in NAD, obese male Gait: Normal HENT: WNL Eyes: Pupils equal Pulmonary: normal non-labored breathing, without Rales, rhonchi, wheezing Cardiac: RRR, no murmur detected, pacemaker/defibillator subcutaneous left upper chest  Abdomen: soft, NT, no masses palpated Skin: no rashes, ulcers noted; no Gangrene , no cellulitis; no open wounds.   VASCULAR EXAM  Carotid Bruits Right Left   Negative Negative   radial pulses: faintly palpable right, 1+ palpable right brachial; left radial is 1+ palpable Aorta is not palpable   VASCULAR EXAM: Extremities without ischemic changes  without Gangrene; without open wounds.     LE Pulses Right Left   FEMORAL not palpable not palpable    POPLITEAL not palpable  not palpable   POSTERIOR TIBIAL 1+ palpable  not palpable    DORSALIS PEDIS  ANTERIOR TIBIAL 2+ palpable  2+ palpable      Musculoskeletal: no muscle wasting or atrophy; no peripheral edema Neurologic: A&O X 3; Appropriate Affect ;  SENSATION: normal; MOTOR FUNCTION: 5/5 Symmetric, CN 2-12 intact Speech is fluent/normal          Non-Invasive Vascular Imaging (04/20/2015):  ABDOMINAL AORTA DUPLEX  EVALUATION    INDICATION: Evaluation of abdominal aorta.  PREVIOUS INTERVENTION(S):     DUPLEX EXAM:     LOCATION DIAMETER AP (cm) DIAMETER TRANSVERSE (cm) VELOCITIES (cm/sec)  Aorta Proximal 2.53 2.59 Not Visualized   Aorta Mid 2.78 2.84 43  Aorta Distal 4.52 4.58 Not Visualized   Right Common Iliac Artery 1.34 1.34 119  Left Common Iliac Artery 1.47 1.44 148    Previous max aortic diameter:  4.72 x 4.72 Date: 10/21/2014  ADDITIONAL FINDINGS:     IMPRESSION: Patent abdominal aortic aneurysm measuring approximately 4.52 x 4.58 cm in diameter with non-occluding intramural thrombus present.     Compared to the previous exam:  No significant change in comparison to the last exam on 10/21/2014.      ASSESSMENT:  MAYER VONDRAK is a 74 y.o. male who is status post left CEA in 2006. Patient also has a moderate AAA that is being followed with serial duplex. He returns today for follow up of his AAA. He had a TIA in 2011 and January 2016. He has a history of atrial fib, takes Pradaxa.  Today's AAA Duplex suggests a patent abdominal aortic aneurysm measuring approximately 4.52 x 4.58 cm in diameter with non-occluding intramural thrombus present. No significant change in comparison to the last exam on 10/21/2014.  Discussed pt concerns with Dr. [name] re elective repair now vs waiting; AAA is too small to consider repair at this point; will get CTA abd/pelvis in 6 months and pt will follow up with Dr. [name].  PLAN:   Based on today's exam and non-invasive vascular lab results, the patient will follow up in 6 months with the following tests:  CTA abd/pelvis and cartoid Duplex which is already scheduled, follow up with Dr. [name]. I discussed in depth with the patient the nature of atherosclerosis, and emphasized the importance of maximal medical management including strict control of blood pressure, blood glucose, and lipid levels, obtaining regular exercise, and cessation of smoking.   The patient is aware that without maximal medical management the underlying atherosclerotic disease process will progress, limiting the benefit of any interventions. Consideration for repair of AAA would be made when the size is 5.5 cm, growth > 1 cm/yr, and symptomatic status. The patient was given information about stroke prevention and what symptoms should prompt the patient to seek immediate medical care. The patient was given information about AAA including signs, symptoms, treatment,  what symptoms should prompt the patient to seek immediate medical care, and how to minimize the risk of enlargement and rupture of aneurysms.  Thank you for allowing [place] to participate in this patient's care.  Charisse March, RN, MSN, FNP-C [facility] Office: [phone]  Clinic MD: Early  04/20/2015 10:58 AM

## 2015-04-20 NOTE — Patient Instructions (Signed)
Stroke Prevention Some medical conditions and behaviors are associated with an increased chance of having a stroke. You may prevent a stroke by making healthy choices and managing medical conditions. HOW CAN I REDUCE MY RISK OF HAVING A STROKE?   Stay physically active. Get at least 30 minutes of activity on most or all days.  Do not smoke. It may also be helpful to avoid exposure to secondhand smoke.  Limit alcohol use. Moderate alcohol use is considered to be:  No more than 2 drinks per day for men.  No more than 1 drink per day for nonpregnant women.  Eat healthy foods. This involves:  Eating 5 or more servings of fruits and vegetables a day.  Making dietary changes that address high blood pressure (hypertension), high cholesterol, diabetes, or obesity.  Manage your cholesterol levels.  Making food choices that are high in fiber and low in saturated fat, trans fat, and cholesterol may control cholesterol levels.  Take any prescribed medicines to control cholesterol as directed by your health care provider.  Manage your diabetes.  Controlling your carbohydrate and sugar intake is recommended to manage diabetes.  Take any prescribed medicines to control diabetes as directed by your health care provider.  Control your hypertension.  Making food choices that are low in salt (sodium), saturated fat, trans fat, and cholesterol is recommended to manage hypertension.  Take any prescribed medicines to control hypertension as directed by your health care provider.  Maintain a healthy weight.  Reducing calorie intake and making food choices that are low in sodium, saturated fat, trans fat, and cholesterol are recommended to manage weight.  Stop drug abuse.  Avoid taking birth control pills.  Talk to your health care provider about the risks of taking birth control pills if you are over 35 years old, smoke, get migraines, or have ever had a blood clot.  Get evaluated for sleep  disorders (sleep apnea).  Talk to your health care provider about getting a sleep evaluation if you snore a lot or have excessive sleepiness.  Take medicines only as directed by your health care provider.  For some people, aspirin or blood thinners (anticoagulants) are helpful in reducing the risk of forming abnormal blood clots that can lead to stroke. If you have the irregular heart rhythm of atrial fibrillation, you should be on a blood thinner unless there is a good reason you cannot take them.  Understand all your medicine instructions.  Make sure that other conditions (such as anemia or atherosclerosis) are addressed. SEEK IMMEDIATE MEDICAL CARE IF:   You have sudden weakness or numbness of the face, arm, or leg, especially on one side of the body.  Your face or eyelid droops to one side.  You have sudden confusion.  You have trouble speaking (aphasia) or understanding.  You have sudden trouble seeing in one or both eyes.  You have sudden trouble walking.  You have dizziness.  You have a loss of balance or coordination.  You have a sudden, severe headache with no known cause.  You have new chest pain or an irregular heartbeat. Any of these symptoms may represent a serious problem that is an emergency. Do not wait to see if the symptoms will go away. Get medical help at once. Call your local emergency services (911 in [place] Do not drive yourself to the hospital. Document Released: 01/04/2005 Document Revised: 04/13/2014 Document Reviewed: 05/30/2013 ExitCare Patient Information 2015 [employer]. This information is not intended to replace advice given   to you by your health care provider. Make sure you discuss any questions you have with your health care provider.    Abdominal Aortic Aneurysm An aneurysm is a weakened or damaged part of an artery wall that bulges from the normal force of blood pumping through the body. An abdominal aortic aneurysm is an aneurysm that  occurs in the lower part of the aorta, the main artery of the body.  The major concern with an abdominal aortic aneurysm is that it can enlarge and burst (rupture) or blood can flow between the layers of the wall of the aorta through a tear (aorticdissection). Both of these conditions can cause bleeding inside the body and can be life threatening unless diagnosed and treated promptly. CAUSES  The exact cause of an abdominal aortic aneurysm is unknown. Some contributing factors are:   A hardening of the arteries caused by the buildup of fat and other substances in the lining of a blood vessel (arteriosclerosis).  Inflammation of the walls of an artery (arteritis).   Connective tissue diseases, such as Marfan syndrome.   Abdominal trauma.   An infection, such as syphilis or staphylococcus, in the wall of the aorta (infectious aortitis) caused by bacteria. RISK FACTORS  Risk factors that contribute to an abdominal aortic aneurysm may include:  Age older than 60 years.   High blood pressure (hypertension).  Male gender.  Ethnicity (white race).  Obesity.  Family history of aneurysm (first degree relatives only).  Tobacco use. PREVENTION  The following healthy lifestyle habits may help decrease your risk of abdominal aortic aneurysm:  Quitting smoking. Smoking can raise your blood pressure and cause arteriosclerosis.  Limiting or avoiding alcohol.  Keeping your blood pressure, blood sugar level, and cholesterol levels within normal limits.  Decreasing your salt intake. In somepeople, too much salt can raise blood pressure and increase your risk of abdominal aortic aneurysm.  Eating a diet low in saturated fats and cholesterol.  Increasing your fiber intake by including whole grains, vegetables, and fruits in your diet. Eating these foods may help lower blood pressure.  Maintaining a healthy weight.  Staying physically active and exercising regularly. SYMPTOMS  The  symptoms of abdominal aortic aneurysm may vary depending on the size and rate of growth of the aneurysm.Most grow slowly and do not have any symptoms. When symptoms do occur, they may include:  Pain (abdomen, side, lower back, or groin). The pain may vary in intensity. A sudden onset of severe pain may indicate that the aneurysm has ruptured.  Feeling full after eating only small amounts of food.  Nausea or vomiting or both.  Feeling a pulsating lump in the abdomen.  Feeling faint or passing out. DIAGNOSIS  Since most unruptured abdominal aortic aneurysms have no symptoms, they are often discovered during diagnostic exams for other conditions. An aneurysm may be found during the following procedures:  Ultrasonography (A one-time screening for abdominal aortic aneurysm by ultrasonography is also recommended for all men aged 65-75 years who have ever smoked).  X-ray exams.  A computed tomography (CT).  Magnetic resonance imaging (MRI).  Angiography or arteriography. TREATMENT  Treatment of an abdominal aortic aneurysm depends on the size of your aneurysm, your age, and risk factors for rupture. Medication to control blood pressure and pain may be used to manage aneurysms smaller than 6 cm. Regular monitoring for enlargement may be recommended by your caregiver if:  The aneurysm is 3-4 cm in size (an annual ultrasonography may be recommended).    The aneurysm is 4-4.5 cm in size (an ultrasonography every 6 months may be recommended).  The aneurysm is larger than 4.5 cm in size (your caregiver may ask that you be examined by a vascular surgeon). If your aneurysm is larger than 6 cm, surgical repair may be recommended. There are two main methods for repair of an aneurysm:   Endovascular repair (a minimally invasive surgery). This is done most often.  Open repair. This method is used if an endovascular repair is not possible. Document Released: 09/06/2005 Document Revised: 03/24/2013  Document Reviewed: 12/27/2012 ExitCare Patient Information 2015 [employer]. This information is not intended to replace advice given to you by your health care provider. Make sure you discuss any questions you have with your health care provider.  

## 2015-04-21 ENCOUNTER — Telehealth (HOSPITAL_COMMUNITY): Payer: Self-pay | Admitting: Family

## 2015-04-21 NOTE — Telephone Encounter (Signed)
Carotid u/s scheduled

## 2015-04-21 NOTE — Telephone Encounter (Signed)
-----   Message from Carma Lair Nickel, NP sent at 04/20/2015 10:25 PM EDT ----- Regarding: also due for carotid Duplex in 6 months [place],   the patient will follow up in 6 months with the following tests:  CTA abd/pelvis for AAA evaluation and cartoid Duplex which is already scheduled, follow up with Dr. [name].  Pt is also due for carotid Duplex in 6 months which will be a year from the last carotid Duplex.  Looking at his future appointments it appears that the CTA abd/pelvis is there, but not the carotid Duplex which he also needs, see Dr. [name] afterward. Thank you, Rosalita Chessman

## 2015-04-26 NOTE — Addendum Note (Signed)
Addended by: Melodye Ped C on: 04/26/2015 01:30 PM   Modules accepted: Orders

## 2015-05-09 ENCOUNTER — Encounter: Payer: Self-pay | Admitting: Internal Medicine

## 2015-05-11 ENCOUNTER — Ambulatory Visit (INDEPENDENT_AMBULATORY_CARE_PROVIDER_SITE_OTHER): Payer: Medicare Other | Admitting: *Deleted

## 2015-05-11 DIAGNOSIS — Z95810 Presence of automatic (implantable) cardiac defibrillator: Secondary | ICD-10-CM | POA: Diagnosis not present

## 2015-05-11 DIAGNOSIS — I255 Ischemic cardiomyopathy: Secondary | ICD-10-CM

## 2015-05-11 DIAGNOSIS — I5022 Chronic systolic (congestive) heart failure: Secondary | ICD-10-CM

## 2015-05-11 NOTE — Progress Notes (Signed)
Remote ICD transmission.   

## 2015-05-13 ENCOUNTER — Encounter: Payer: Self-pay | Admitting: *Deleted

## 2015-05-13 NOTE — Progress Notes (Signed)
EPIC Encounter for ICM Monitoring  Patient Name: [name] [name] is a 74 y.o. male Date: 05/13/2015 Primary Care Physican: [name], MD Primary Cardiologist: [name] Electrophysiologist: [name] Dry Weight: 238 lb   Bi-V pacing: 98%      In the past month, have you:  1. Gained more than 2 pounds in a day or more than 5 pounds in a week? Uncertain- he does not weigh on a regular basis.  2. Had changes in your medications (with verification of current medications)? no  3. Had more shortness of breath than is usual for you? no  4. Limited your activity because of shortness of breath? no  5. Not been able to sleep because of shortness of breath? no  6. Had increased swelling in your feet or ankles? no  7. Had symptoms of dehydration (dizziness, dry mouth, increased thirst, decreased urine output) no  8. Had changes in sodium restriction? no  9. Been compliant with medication? Yes   ICM trend:   Follow-up plan: ICM clinic phone appointment: 06/17/15. No changes made today.  Copy of note sent to patient's primary care physician, primary cardiologist, and device following physician.  Sherri Rad, RN, BSN 05/13/2015 12:49 PM

## 2015-05-13 NOTE — Addendum Note (Signed)
Addended by: Sherri Rad C on: 05/13/2015 12:52 PM   Modules accepted: Level of Service

## 2015-05-17 LAB — CUP PACEART REMOTE DEVICE CHECK
Battery Remaining Longevity: 55 mo
Battery Remaining Percentage: 63 %
Battery Voltage: 2.95 V
Brady Statistic AP VS Percent: 1 %
Date Time Interrogation Session: 20160530032117
HIGH POWER IMPEDANCE MEASURED VALUE: 84 Ohm
HighPow Impedance: 84 Ohm
Lead Channel Impedance Value: 460 Ohm
Lead Channel Impedance Value: 460 Ohm
Lead Channel Impedance Value: 480 Ohm
Lead Channel Pacing Threshold Amplitude: 0.75 V
Lead Channel Pacing Threshold Amplitude: 0.75 V
Lead Channel Pacing Threshold Amplitude: 1 V
Lead Channel Pacing Threshold Pulse Width: 0.5 ms
Lead Channel Pacing Threshold Pulse Width: 0.5 ms
Lead Channel Sensing Intrinsic Amplitude: 11.7 mV
Lead Channel Setting Pacing Amplitude: 2 V
Lead Channel Setting Pacing Amplitude: 2 V
Lead Channel Setting Pacing Pulse Width: 0.5 ms
Lead Channel Setting Pacing Pulse Width: 0.5 ms
Lead Channel Setting Sensing Sensitivity: 0.5 mV
MDC IDC MSMT LEADCHNL LV PACING THRESHOLD PULSEWIDTH: 0.5 ms
MDC IDC MSMT LEADCHNL RA SENSING INTR AMPL: 5 mV
MDC IDC SET LEADCHNL RA PACING AMPLITUDE: 1.75 V
MDC IDC SET ZONE DETECTION INTERVAL: 280 ms
MDC IDC STAT BRADY AP VP PERCENT: 80 %
MDC IDC STAT BRADY AS VP PERCENT: 17 %
MDC IDC STAT BRADY AS VS PERCENT: 1.4 %
MDC IDC STAT BRADY RA PERCENT PACED: 81 %
Pulse Gen Model: 3265
Pulse Gen Serial Number: 1075181
Zone Setting Detection Interval: 340 ms

## 2015-05-31 ENCOUNTER — Encounter: Payer: Self-pay | Admitting: Cardiology

## 2015-06-14 ENCOUNTER — Other Ambulatory Visit: Payer: Self-pay | Admitting: Cardiology

## 2015-06-15 ENCOUNTER — Other Ambulatory Visit: Payer: Self-pay

## 2015-06-15 MED ORDER — PRAVASTATIN SODIUM 80 MG PO TABS: 80.0000 mg | ORAL_TABLET | Freq: Every day | ORAL | Status: DC

## 2015-06-17 ENCOUNTER — Ambulatory Visit (INDEPENDENT_AMBULATORY_CARE_PROVIDER_SITE_OTHER): Payer: Medicare Other | Admitting: *Deleted

## 2015-06-17 DIAGNOSIS — Z95810 Presence of automatic (implantable) cardiac defibrillator: Secondary | ICD-10-CM

## 2015-06-17 DIAGNOSIS — I5022 Chronic systolic (congestive) heart failure: Secondary | ICD-10-CM | POA: Diagnosis not present

## 2015-06-18 ENCOUNTER — Encounter: Payer: Self-pay | Admitting: *Deleted

## 2015-06-18 NOTE — Progress Notes (Signed)
EPIC Encounter for ICM Monitoring  Patient Name: [name] [name] is a 74 y.o. male Date: 06/18/2015 Primary Care Physican: [name], MD Primary Cardiologist: [name] Electrophysiologist: [name] Dry Weight: 238 lbs  Bi-V pacing: 98%       In the past month, have you:  1. Gained more than 2 pounds in a day or more than 5 pounds in a week? no  2. Had changes in your medications (with verification of current medications)? no  3. Had more shortness of breath than is usual for you? no  4. Limited your activity because of shortness of breath? no  5. Not been able to sleep because of shortness of breath? no  6. Had increased swelling in your feet or ankles? no  7. Had symptoms of dehydration (dizziness, dry mouth, increased thirst, decreased urine output) no  8. Had changes in sodium restriction? no  9. Been compliant with medication? Yes   ICM trend:    Follow-up plan: ICM clinic phone appointment: 08/10/15. No changes made today.  Copy of note sent to patient's primary care physician, primary cardiologist, and device following physician.  Sherri Rad, RN, BSN 06/18/2015 1:45 PM

## 2015-07-05 ENCOUNTER — Encounter: Payer: Self-pay | Admitting: Neurology

## 2015-07-05 ENCOUNTER — Ambulatory Visit (INDEPENDENT_AMBULATORY_CARE_PROVIDER_SITE_OTHER): Payer: Medicare Other | Admitting: Neurology

## 2015-07-05 VITALS — BP 145/76 | HR 59 | Ht 73.0 in | Wt 244.4 lb

## 2015-07-05 DIAGNOSIS — F329 Major depressive disorder, single episode, unspecified: Secondary | ICD-10-CM | POA: Diagnosis not present

## 2015-07-05 DIAGNOSIS — I480 Paroxysmal atrial fibrillation: Secondary | ICD-10-CM | POA: Diagnosis not present

## 2015-07-05 DIAGNOSIS — I10 Essential (primary) hypertension: Secondary | ICD-10-CM | POA: Diagnosis not present

## 2015-07-05 DIAGNOSIS — H8110 Benign paroxysmal vertigo, unspecified ear: Secondary | ICD-10-CM

## 2015-07-05 DIAGNOSIS — E785 Hyperlipidemia, unspecified: Secondary | ICD-10-CM

## 2015-07-05 DIAGNOSIS — I6340 Cerebral infarction due to embolism of unspecified cerebral artery: Secondary | ICD-10-CM | POA: Diagnosis not present

## 2015-07-05 DIAGNOSIS — F32A Depression, unspecified: Secondary | ICD-10-CM

## 2015-07-05 NOTE — Patient Instructions (Addendum)
-   continue pradaxa and pravachol for stroke prevention - continue to follow up with cardiology for intermittent afib and pacemaker management - BPPV maneuver if there is recurrent vertigo - check BP at home and record and bring to PCP or cardiology for medication adjustment if needed - Follow up with your primary care physician for stroke risk factor modification. Recommend maintain blood pressure goal <140/80, diabetes with hemoglobin A1c goal below 6.5% and lipids with LDL cholesterol goal below 70 mg/dL.  - follow up in one year.

## 2015-07-05 NOTE — Progress Notes (Signed)
STROKE NEUROLOGY FOLLOW UP NOTE  NAME: [name] [name] DOB: [birth year]  REASON FOR VISIT: stroke follow up HISTORY FROM: Patient and chart  Today we had the pleasure of seeing [name] [name] in follow-up at our Neurology Clinic. Pt was accompanied by no one.   History Summary [name] is a 74 y.o. male with history of atrial fibrillation on Pradaxa, CAD, prior left CEA, AAA, COPD, hyperlipidemia, hypertension, ischemic cardiomyopathy s/p pacemaker and previous stroke was admitted on 01/10/2015 for dizziness and right hemiparesis with hemisensory loss and vomiting. MRI not able to perform due to pacemaker. Initial CT and repeat CT did not show acute abnormalities. Stroke workup including CTA head and neck, carotid Doppler and 2-D echo unremarkable. Neuro exam found to have possible positive Dix-Hallpike and giveaway weakness on the right side. By further questioning, patient has suffered depression since his wife passed away. He was put on Celexa, and referred to outpatient BPPV maneuver. He was discharged in good condition with continuation of his Pradaxa and The statin for stroke prevention.  03/01/15 follow up - the patient has been doing well.  He stated that he had worked with PT OT twice a week for maneuver training and vestibular rehabilitation, and he is feeling great. His blood pressure 133/80 in clinic today. He has no weakness or numbness or any neurological deficit. His Celexa has been discontinued by his own. He stated that his depression much better, and he recently met a lady who also recently lost her husband, and they have good relationship each other.  Interval History During the interval time, the pt has been doing well. He is not depressed anymore and he feel good. He has stopped celexa and currently not on depression meds. His BP at home around 140-150 and today in clinic 145/79. He is doing BPPV maneuver at home. Still on pradaxa and no  complications.  REVIEW OF SYSTEMS: Full 14 system review of systems performed and notable only for those listed below and in HPI above, all others are negative:  Constitutional:   Cardiovascular:  Ear/Nose/Throat:   Skin:  Eyes:   Respiratory:  wheezing Gastroitestinal:   Genitourinary:  Hematology/Lymphatic:   Endocrine:  Musculoskeletal:   Allergy/Immunology:   Neurological:   Psychiatric:  Sleep:   The following represents the patient's updated allergies and side effects list: No Known Allergies  The neurologically relevant items on the patient's problem list were reviewed on today's visit.  Neurologic Examination  A problem focused neurological exam (12 or more points of the single system neurologic examination, vital signs counts as 1 point, cranial nerves count for 8 points) was performed.  Blood pressure 145/76, pulse 59, height _0  (1.854 m), weight 244 lb 6.4 oz (110.859 kg).  General - Well nourished, well developed, in no apparent distress.  Ophthalmologic - fundi not visualized due to small pupils.  Cardiovascular - Regular rate and rhythm, no irregular heart rate heard.  Mental Status -  Level of arousal and orientation to time, place, and person were intact. Language including expression, naming, repetition, comprehension was assessed and found intact.  Cranial Nerves II - XII - II - Visual field intact OU. III, IV, VI - Extraocular movements intact. V - Facial sensation intact bilaterally. VII - Facial movement intact bilaterally. VIII - Hearing & vestibular intact bilaterally. X - Palate elevates symmetrically. XI - Chin turning & shoulder shrug intact bilaterally. XII - Tongue protrusion intact.  Motor Strength - The patient's strength was  normal in all extremities and pronator drift was absent.  Bulk was normal and fasciculations were absent.   Motor Tone - Muscle tone was assessed at the neck and appendages and was normal.  Reflexes - The  patient's reflexes were 1+ in all extremities and he had no pathological reflexes.  Sensory - Light touch, temperature/pinprick were assessed and were normal.    Coordination - The patient had normal movements in the hands and feet with no ataxia or dysmetria. No tremor is present.  Gait and Station - broad-based, mildly slow and cautious gait, no stooped posturing.  Data reviewed: I personally reviewed the images and agree with the radiology interpretations.  Dg Chest 2 View 01/10/2015 No acute cardiopulmonary disease.   Ct Head Wo Contrast 01/11/2015 No acute intracranial abnormality. White matter changes likely represent chronic small vessel ischemic disease. Diffuse paranasal sinus disease.  01/10/2015 1. No acute intracranial abnormalities. 2. Age related volume loss and mild chronic microvascular ischemic change. 3. Significant sinus disease  CTA NECK 12/25/2014  1. No hemodynamically significant stenosis or vascular occlusion identified within the neck.  2. Moderate calcified plaque at the right carotid bifurcation and proximal right ICA with associated mild stenosis of 20-30% by NASCET criteria.  3. Sequelae of prior left carotid endarterectomy. The left ICA is widely patent.   CTA BRAIN 12/25/2014  1. No proximal branch occlusion identified within the intracranial circulation.  2. Moderate calcified atheromatous disease within the cavernous ICAs with associated stenosis of up to 50% by NASCET criteria.  3. Mild atherosclerotic plaque within the distal vertebral arteries bilaterally without significant stenosis.   Carotid Doppler 10/2014 - R ICA < 40%, L ICA patent s/p hx CEA  2-D echocardiogram 12/25/2014 - Left ventricle: The cavity size was severely dilated. Wallthickness was increased in a pattern of mild LVH. Systolicfunction was mildly to moderately reduced. The estimated ejectionfraction was in the range of 40% to 45%. Diffuse hypokinesis.There  is akinesis of the inferior myocardium. Doppler parametersare consistent with abnormal left ventricular relaxation (grade 1diastolic dysfunction). - Left atrium: The atrium was moderately dilated. Impressions: - Inferior akinesis with mild to moderately reduced LV function;grade 1 diastolic dysfunction; if clinically indicated, TEE wouldhave better sensitivity for source of embolus.  Component     Latest Ref Rng 12/24/2014 02/05/2015  Cholesterol     0 - 200 mg/dL 144   Triglycerides     <150 mg/dL 154 (H)   HDL     >39 mg/dL 38 (L)   Total CHOL/HDL Ratio      3.8   VLDL     0 - 40 mg/dL 31   LDL (calc)     0 - 99 mg/dL 75   Hemoglobin A1C     <5.7 % 5.5   Mean Plasma Glucose     <117 mg/dL 111   TSH     0.350 - 4.500 uIU/mL  0.620    Assessment: As you may recall, he is a 74 y.o. Caucasian male with PMH of afib on Pradaxa, CAD, prior left CEA, AAA, COPD, HLD, HTN, ischemic cardiomyopathy s/p pacemaker and previous strokewas admitted on 01/10/2015 for dizziness and right hemiparesis with hemisensory loss and vomiting. MRI not able to perform due to pacemaker. Initial CT and repeat CT did not show acute abnormalities. Stroke workup including CTA head and neck, carotid Doppler and 2-D echo unremarkable. Neuro exam found to have possible positive Dix-Hallpike and give-away weakness on the right side. By further questioning, patient suffered  depression since his wife passed away. He was put on Celexa, and referred to outpatient BPPV maneuver. He was discharged in good condition with continuation of his Pradaxa and statin for stroke prevention. Since discharge, he was working with PT OT for BPPV maneuver and vestibular rehabilitation. His depression also much better and currently off Celexa. Still Pradaxa and statin for stroke prevention. BP mildly higher than the goal.   Plan:  - continue pradaxa and pravachol for stroke prevention - continue to follow up with cardiology for  intermittent afib and pacemaker management - BPPV maneuver if there is recurrent vertigo - check BP at home and record and bring to PCP or cardiology for medication adjustment if needed - Follow up with your primary care physician for stroke risk factor modification. Recommend maintain blood pressure goal <140/80, diabetes with hemoglobin A1c goal below 6.5% and lipids with LDL cholesterol goal below 70 mg/dL.  - RTC in 1 year.  No orders of the defined types were placed in this encounter.    No orders of the defined types were placed in this encounter.    Patient Instructions  - continue pradaxa and pravachol for stroke prevention - continue to follow up with cardiology for intermittent afib and pacemaker management - BPPV maneuver if there is recurrent vertigo - check BP at home and record and bring to PCP or cardiology for medication adjustment if needed - Follow up with your primary care physician for stroke risk factor modification. Recommend maintain blood pressure goal <140/80, diabetes with hemoglobin A1c goal below 6.5% and lipids with LDL cholesterol goal below 70 mg/dL.  - follow up in one year.    Rosalin Hawking, MD PhD [facility] [address], [place], [place] [address] [phone]

## 2015-08-02 ENCOUNTER — Other Ambulatory Visit: Payer: Self-pay | Admitting: Cardiology

## 2015-08-02 NOTE — Telephone Encounter (Signed)
Rx(s) sent to pharmacy electronically.  

## 2015-08-10 ENCOUNTER — Ambulatory Visit (INDEPENDENT_AMBULATORY_CARE_PROVIDER_SITE_OTHER): Payer: Medicare Other | Admitting: *Deleted

## 2015-08-10 ENCOUNTER — Encounter: Payer: Self-pay | Admitting: Internal Medicine

## 2015-08-10 DIAGNOSIS — I5022 Chronic systolic (congestive) heart failure: Secondary | ICD-10-CM

## 2015-08-10 DIAGNOSIS — I255 Ischemic cardiomyopathy: Secondary | ICD-10-CM | POA: Diagnosis not present

## 2015-08-10 DIAGNOSIS — Z95810 Presence of automatic (implantable) cardiac defibrillator: Secondary | ICD-10-CM

## 2015-08-10 NOTE — Progress Notes (Signed)
EPIC Encounter for ICM Monitoring  Patient Name: [name] [name] is a 74 y.o. male Date: 08/10/2015 Primary Care Physican: [name], MD Primary Cardiologist: [name] Electrophysiologist: [name] Dry Weight: 244 lbs at last office visit 07/05/2015       Bi-V Pacing 98%  In the past month, have you:  1. Gained more than 2 pounds in a day or more than 5 pounds in a week? no  2. Had changes in your medications (with verification of current medications)? no  3. Had more shortness of breath than is usual for you? no  4. Limited your activity because of shortness of breath? no  5. Not been able to sleep because of shortness of breath? no  6. Had increased swelling in your feet or ankles? no  7. Had symptoms of dehydration (dizziness, dry mouth, increased thirst, decreased urine output) no  8. Had changes in sodium restriction? no  9. Been compliant with medication? Yes   ICM trend:   Follow-up plan: ICM clinic phone appointment 09/10/2015.  [employer] transmission revealed impedance below baseline ~08/04/2015 to 08/08/2015 and member denied any changes in the last week.  He does not weigh himself and education given on daily weights and importance of monitoring for fluid retention.   Education given on HF symptoms and to call office should any occur.   Answered questions on how device can monitor fluids.  He stated he has a little shortness of breath when he walks.   Copy of note sent to patient's primary care physician, primary cardiologist, and device following physician.  Karie Soda, RN, CCM 08/10/2015 1:24 PM

## 2015-08-10 NOTE — Progress Notes (Signed)
Remote ICD transmission.   

## 2015-08-22 LAB — CUP PACEART REMOTE DEVICE CHECK
Battery Remaining Longevity: 52 mo
Battery Voltage: 2.95 V
Brady Statistic AP VP Percent: 84 %
Brady Statistic AS VS Percent: 1.1 %
Date Time Interrogation Session: 20160830060017
HIGH POWER IMPEDANCE MEASURED VALUE: 81 Ohm
HIGH POWER IMPEDANCE MEASURED VALUE: 81 Ohm
Lead Channel Impedance Value: 480 Ohm
Lead Channel Pacing Threshold Amplitude: 0.625 V
Lead Channel Pacing Threshold Amplitude: 0.75 V
Lead Channel Pacing Threshold Amplitude: 0.75 V
Lead Channel Pacing Threshold Pulse Width: 0.5 ms
Lead Channel Sensing Intrinsic Amplitude: 12 mV
Lead Channel Sensing Intrinsic Amplitude: 5 mV
Lead Channel Setting Pacing Amplitude: 1.75 V
Lead Channel Setting Pacing Amplitude: 2 V
Lead Channel Setting Sensing Sensitivity: 0.5 mV
MDC IDC MSMT BATTERY REMAINING PERCENTAGE: 60 %
MDC IDC MSMT LEADCHNL LV IMPEDANCE VALUE: 510 Ohm
MDC IDC MSMT LEADCHNL LV PACING THRESHOLD PULSEWIDTH: 0.5 ms
MDC IDC MSMT LEADCHNL RA IMPEDANCE VALUE: 460 Ohm
MDC IDC MSMT LEADCHNL RA PACING THRESHOLD PULSEWIDTH: 0.5 ms
MDC IDC SET LEADCHNL LV PACING AMPLITUDE: 2 V
MDC IDC SET LEADCHNL LV PACING PULSEWIDTH: 0.5 ms
MDC IDC SET LEADCHNL RV PACING PULSEWIDTH: 0.5 ms
MDC IDC STAT BRADY AP VS PERCENT: 1 %
MDC IDC STAT BRADY AS VP PERCENT: 14 %
MDC IDC STAT BRADY RA PERCENT PACED: 85 %
Pulse Gen Model: 3265
Pulse Gen Serial Number: 1075181
Zone Setting Detection Interval: 280 ms
Zone Setting Detection Interval: 340 ms

## 2015-08-30 ENCOUNTER — Encounter: Payer: Self-pay | Admitting: *Deleted

## 2015-08-31 ENCOUNTER — Encounter: Payer: Self-pay | Admitting: Cardiology

## 2015-09-07 ENCOUNTER — Encounter: Payer: Self-pay | Admitting: Cardiology

## 2015-09-07 NOTE — Telephone Encounter (Signed)
Called out to patient - informed him Stanton Kidney is out of office today, could not find any recent results, etc - was there something specific he was calling about?  He states he doesn't know, just had a missed call from our office yesterday. Informed him i would route to  [place], she can return call later in the week. Pt agreeable to this plan.

## 2015-09-07 NOTE — Telephone Encounter (Signed)
New message      Pt says he is returning a call to [employer]

## 2015-09-08 NOTE — Telephone Encounter (Signed)
This encounter was created in error - please disregard.

## 2015-09-09 ENCOUNTER — Emergency Department (HOSPITAL_COMMUNITY)
Admission: EM | Admit: 2015-09-09 | Discharge: 2015-09-09 | Disposition: A | Discharge status: Home / Self Care | Payer: Medicare Other | Attending: Emergency Medicine | Admitting: Emergency Medicine

## 2015-09-09 ENCOUNTER — Encounter (HOSPITAL_COMMUNITY): Payer: Self-pay | Admitting: Emergency Medicine

## 2015-09-09 ENCOUNTER — Emergency Department (HOSPITAL_COMMUNITY): Payer: Medicare Other

## 2015-09-09 ENCOUNTER — Other Ambulatory Visit: Payer: Self-pay | Admitting: Neurology

## 2015-09-09 DIAGNOSIS — Z79899 Other long term (current) drug therapy: Secondary | ICD-10-CM | POA: Diagnosis not present

## 2015-09-09 DIAGNOSIS — G5601 Carpal tunnel syndrome, right upper limb: Secondary | ICD-10-CM

## 2015-09-09 DIAGNOSIS — Z87891 Personal history of nicotine dependence: Secondary | ICD-10-CM | POA: Insufficient documentation

## 2015-09-09 DIAGNOSIS — Z87448 Personal history of other diseases of urinary system: Secondary | ICD-10-CM | POA: Diagnosis not present

## 2015-09-09 DIAGNOSIS — I2510 Atherosclerotic heart disease of native coronary artery without angina pectoris: Secondary | ICD-10-CM | POA: Insufficient documentation

## 2015-09-09 DIAGNOSIS — I509 Heart failure, unspecified: Secondary | ICD-10-CM | POA: Insufficient documentation

## 2015-09-09 DIAGNOSIS — E785 Hyperlipidemia, unspecified: Secondary | ICD-10-CM | POA: Insufficient documentation

## 2015-09-09 DIAGNOSIS — I252 Old myocardial infarction: Secondary | ICD-10-CM | POA: Diagnosis not present

## 2015-09-09 DIAGNOSIS — I4891 Unspecified atrial fibrillation: Secondary | ICD-10-CM | POA: Insufficient documentation

## 2015-09-09 DIAGNOSIS — Z86718 Personal history of other venous thrombosis and embolism: Secondary | ICD-10-CM | POA: Insufficient documentation

## 2015-09-09 DIAGNOSIS — J449 Chronic obstructive pulmonary disease, unspecified: Secondary | ICD-10-CM | POA: Diagnosis not present

## 2015-09-09 DIAGNOSIS — I10 Essential (primary) hypertension: Secondary | ICD-10-CM | POA: Diagnosis not present

## 2015-09-09 DIAGNOSIS — Z9181 History of falling: Secondary | ICD-10-CM | POA: Insufficient documentation

## 2015-09-09 DIAGNOSIS — Z8673 Personal history of transient ischemic attack (TIA), and cerebral infarction without residual deficits: Secondary | ICD-10-CM | POA: Insufficient documentation

## 2015-09-09 DIAGNOSIS — G5600 Carpal tunnel syndrome, unspecified upper limb: Secondary | ICD-10-CM | POA: Insufficient documentation

## 2015-09-09 DIAGNOSIS — R200 Anesthesia of skin: Secondary | ICD-10-CM | POA: Diagnosis not present

## 2015-09-09 LAB — COMPREHENSIVE METABOLIC PANEL
ALBUMIN: 3.2 g/dL — AB (ref 3.5–5.0)
ALT: 11 U/L — ABNORMAL LOW (ref 17–63)
ANION GAP: 9 (ref 5–15)
AST: 18 U/L (ref 15–41)
Alkaline Phosphatase: 84 U/L (ref 38–126)
BILIRUBIN TOTAL: 0.9 mg/dL (ref 0.3–1.2)
BUN: 11 mg/dL (ref 6–20)
CHLORIDE: 107 mmol/L (ref 101–111)
CO2: 28 mmol/L (ref 22–32)
Calcium: 8.7 mg/dL — ABNORMAL LOW (ref 8.9–10.3)
Creatinine, Ser: 1.11 mg/dL (ref 0.61–1.24)
GFR calc Af Amer: >60 mL/min (ref >60)
GFR calc non Af Amer: >60 mL/min (ref >60)
GLUCOSE: 98 mg/dL (ref 65–99)
POTASSIUM: 3 mmol/L — AB (ref 3.5–5.1)
Sodium: 144 mmol/L (ref 135–145)
TOTAL PROTEIN: 6.3 g/dL — AB (ref 6.5–8.1)

## 2015-09-09 LAB — URINALYSIS, ROUTINE W REFLEX MICROSCOPIC
BILIRUBIN URINE: NEGATIVE
Glucose, UA: NEGATIVE mg/dL
Hgb urine dipstick: NEGATIVE
KETONES UR: NEGATIVE mg/dL
LEUKOCYTES UA: NEGATIVE
NITRITE: NEGATIVE
Protein, ur: 30 mg/dL — AB
SPECIFIC GRAVITY, URINE: 1.012 (ref 1.005–1.030)
UROBILINOGEN UA: 1 mg/dL (ref 0.0–1.0)
pH: 7 (ref 5.0–8.0)

## 2015-09-09 LAB — CBC WITH DIFFERENTIAL/PLATELET
BASOS ABS: 0.1 10*3/uL (ref 0.0–0.1)
BASOS PCT: 1 %
EOS ABS: 0.3 10*3/uL (ref 0.0–0.7)
EOS PCT: 4 %
HEMATOCRIT: 46.8 % (ref 39.0–52.0)
Hemoglobin: 15.2 g/dL (ref 13.0–17.0)
Lymphocytes Relative: 27 %
Lymphs Abs: 1.9 10*3/uL (ref 0.7–4.0)
MCH: 29.5 pg (ref 26.0–34.0)
MCHC: 32.5 g/dL (ref 30.0–36.0)
MCV: 90.9 fL (ref 78.0–100.0)
MONO ABS: 0.6 10*3/uL (ref 0.1–1.0)
MONOS PCT: 8 %
NEUTROS ABS: 4.3 10*3/uL (ref 1.7–7.7)
Neutrophils Relative %: 60 %
PLATELETS: 179 10*3/uL (ref 150–400)
RBC: 5.15 MIL/uL (ref 4.22–5.81)
RDW: 14 % (ref 11.5–15.5)
WBC: 7.1 10*3/uL (ref 4.0–10.5)

## 2015-09-09 LAB — TROPONIN I

## 2015-09-09 LAB — URINE MICROSCOPIC-ADD ON

## 2015-09-09 MED ORDER — HYDRALAZINE HCL 20 MG/ML IJ SOLN
10.0000 mg | Freq: Once | INTRAMUSCULAR | Status: AC
  Administered 2015-09-09: 10 mg via INTRAVENOUS
  Filled 2015-09-09: qty 1

## 2015-09-09 NOTE — ED Provider Notes (Signed)
CSN: [id number]     Arrival date & time 09/09/15  1111 History   First MD Initiated Contact with Patient 09/09/15 1115     Chief Complaint  Patient presents with  . Hypertension  . Numbness  . Stroke Symptoms     (Consider location/radiation/quality/duration/timing/severity/associated sxs/prior Treatment) HPI Comments: Patient presents to the emergency Department with multiple complaints. Patient reports that he did not feel well last night. He reports that he has been experiencing symptoms of being "swimmy headed". He thinks he might have had a fever last night. He continues to feel poorly this morning. He has taken his blood pressure multiple times and he has been around 210/110. He has not been experiencing any chest pain or shortness of breath associated with the symptoms. He does, however, report that since yesterday morning has been experiencing numbness and tingling on the right side of his body. He has not noticed any weakness or clumsiness.  Patient is a 74 y.o. male presenting with hypertension.  Hypertension    Past Medical History  Diagnosis Date  . Atrial fibrillation     s/p prior DCCV;  Amiodarone Rx.  . CAD (coronary artery disease)     s/p CABG;  cath 10/07:  LM ok, LAD occluded, AV CFX 95%, pRCA occluded; L-LAD, S-OM2/OM3, S-PDA ok  . Cerebrovascular disease     s/p prior Left CEA;  followed by Dr. [name]  . COPD (chronic obstructive pulmonary disease)   . Hyperlipidemia   . Hypertension   . Mitral insufficiency     hx of. not noted on echo 01/2012  . Nephrolithiasis   . Ischemic cardiomyopathy     echo 5/09: EF 55%, mild LAE;  Myoview 11/10: inf and apical scar with mild peri-infarct ischemia, EF 44%. Echo 01/26/12 with EF 20-25% but previously known to be 30% on echoes before last one  . Iliac artery aneurysm, left     followed by Dr. [name]  . Aortic dissection     H/O focal aortic dissection  . LBBB (left bundle branch block)   . CHF (congestive heart  failure)   . Myocardial infarction   . Renal insufficiency     01/2012  . Stroke   . CVA (cerebral infarction)   . DVT (deep venous thrombosis)   . Fall from slipping on wet surface Apr 15, 2015    Fx 3 Valma Cava   Past Surgical History  Procedure Laterality Date  . Uteroscopy    . Coronary artery bypass graft  1998  . Left cea  07/05  . Appendectomy  09/11/11  . Biventricular defibrillator implantation  08/22/12    SJM Quadra Assura BiV ICD implanted by Dr [name]  . Spine surgery  Sep 27, 2014    Lower Back L 4  and   L 5  . Bi-ventricular implantable cardioverter defibrillator N/A 08/22/2012    Procedure: BI-VENTRICULAR IMPLANTABLE CARDIOVERTER DEFIBRILLATOR  (CRT-D);  Surgeon: [name], MD;  Location: [facility] CATH LAB;  Service: Cardiovascular;  Laterality: N/A;  . Carotid endarterectomy  July  2005    left   Family History  Problem Relation Age of Onset  . Stroke Maternal Uncle    Social History  Substance Use Topics  . Smoking status: Former Smoker -- 1.50 packs/day for 57 years    Types: Cigarettes    Quit date: 12/11/2004  . Smokeless tobacco: Never Used  . Alcohol Use: No     Comment: 6 yrs ago     Review  of Systems  Constitutional: Positive for fatigue.  Neurological: Positive for dizziness and numbness.  All other systems reviewed and are negative.     Allergies  Review of patient's allergies indicates no known allergies.  Home Medications   Prior to Admission medications   Medication Sig Start Date End Date Taking? Authorizing Provider  albuterol (PROVENTIL HFA;VENTOLIN HFA) 108 (90 BASE) MCG/ACT inhaler Inhale 2 puffs into the lungs every 4 (four) hours as needed for wheezing. 03/29/13   Cathren Laine, MD  amiodarone (PACERONE) 200 MG tablet TAKE 1 TABLET EVERY DAY 08/02/15   Lewayne Bunting, MD  amLODipine (NORVASC) 10 MG tablet TAKE 1 TABLET EVERY DAY 08/02/15   Lewayne Bunting, MD  dabigatran (PRADAXA) 150 MG CAPS capsule Take 1 capsule (150 mg total)  by mouth 2 (two) times daily. 03/08/15   Lewayne Bunting, MD  docusate sodium (COLACE) 100 MG capsule Take 1 capsule (100 mg total) by mouth every 12 (twelve) hours. 06/04/14   Rolland Porter, MD  furosemide (LASIX) 40 MG tablet Take 1 tablet (40 mg total) by mouth daily. 03/10/15   Lewayne Bunting, MD  hydrALAZINE (APRESOLINE) 50 MG tablet Take 0.5 tablets (25 mg total) by mouth 3 (three) times daily. 12/15/14   Lewayne Bunting, MD  ibuprofen (ADVIL,MOTRIN) 200 MG tablet Take 400 mg by mouth every 6 (six) hours as needed for mild pain or moderate pain.    Historical Provider, MD  ipratropium-albuterol (DUONEB) 0.5-2.5 (3) MG/3ML SOLN Take 3 mLs by nebulization every 4 (four) hours as needed (for shortness of breath).    Historical Provider, MD  levofloxacin (LEVAQUIN) 500 MG tablet Take 1 tablet (500 mg total) by mouth daily. Patient not taking: Reported on 07/05/2015 03/06/15   Leroy Sea, MD  lisinopril (PRINIVIL,ZESTRIL) 40 MG tablet TAKE 1 TABLET EVERY DAY 12/29/14   Lewayne Bunting, MD  metoprolol succinate (TOPROL-XL) 25 MG 24 hr tablet Take 1 pill daily 03/06/15   Leroy Sea, MD  Multiple Vitamin (MULITIVITAMIN WITH MINERALS) TABS Take 1 tablet by mouth daily.    Historical Provider, MD  niacin (NIASPAN) 500 MG CR tablet Take 1,000 mg by mouth at bedtime.     Historical Provider, MD  potassium chloride SA (K-DUR,KLOR-CON) 20 MEQ tablet TAKE 2 TABLETS THREE TIMES DAILY 04/13/15   Lewayne Bunting, MD  pravastatin (PRAVACHOL) 80 MG tablet Take 1 tablet (80 mg total) by mouth daily. 06/15/15   Lewayne Bunting, MD  predniSONE (DELTASONE) 5 MG tablet Label  & dispense according to the schedule below. 10 Pills PO for 3 days then, 8 Pills PO for 3 days, 6 Pills PO for 3 days, 4 Pills PO for 3 days, 2 Pills PO for 3 days, 1 Pills PO for 3 days, 1/2 Pill  PO for 3 days then STOP. Total 95 pills. Patient not taking: Reported on 04/20/2015 03/06/15   Leroy Sea, MD   SpO2 96% Physical Exam   Constitutional: He is oriented to person, place, and time. He appears well-developed and well-nourished. No distress.  HENT:  Head: Normocephalic and atraumatic.  Right Ear: Hearing normal.  Left Ear: Hearing normal.  Nose: Nose normal.  Mouth/Throat: Oropharynx is clear and moist and mucous membranes are normal.  Eyes: Conjunctivae and EOM are normal. Pupils are equal, round, and reactive to light.  Neck: Normal range of motion. Neck supple.  Cardiovascular: Regular rhythm, S1 normal and S2 normal.  Exam reveals no gallop and no  friction rub.   No murmur heard. Pulmonary/Chest: Effort normal and breath sounds normal. No respiratory distress. He exhibits no tenderness.  Abdominal: Soft. Normal appearance and bowel sounds are normal. There is no hepatosplenomegaly. There is no tenderness. There is no rebound, no guarding, no tenderness at McBurney's point and negative Murphy's sign. No hernia.  Musculoskeletal: Normal range of motion.  Neurological: He is alert and oriented to person, place, and time. He has normal strength. No cranial nerve deficit or sensory deficit. Coordination normal. GCS eye subscore is 4. GCS verbal subscore is 5. GCS motor subscore is 6.  Skin: Skin is warm, dry and intact. No rash noted. No cyanosis.  Psychiatric: He has a normal mood and affect. His speech is normal and behavior is normal. Thought content normal.  Nursing note and vitals reviewed.   ED Course  Procedures (including critical care time) Labs Review Labs Reviewed  CBC WITH DIFFERENTIAL/PLATELET  COMPREHENSIVE METABOLIC PANEL  TROPONIN I  URINALYSIS, ROUTINE W REFLEX MICROSCOPIC (NOT AT [facility])    Imaging Review No results found. I have personally reviewed and evaluated these images and lab results as part of my medical decision-making.   EKG Interpretation   Date/Time:  Thursday September 09 2015 11:12:42 EDT Ventricular Rate:  66 PR Interval:  50 QRS Duration: 141 QT Interval:   516 QTC Calculation: 541 R Axis:   -41 Text Interpretation:  A-V dual-paced rhythm with some inhibition No  further analysis attempted due to paced rhythm Confirmed by POLLINA  MD,  CHRISTOPHER ([id number]) on 09/09/2015 11:25:47 AM      MDM   Final diagnoses:  None   hypertension  Patient presented to the ER for evaluation of elevated blood pressure. He has been experiencing "swimmy headed" symptoms. He did not have any focal neurologic deficit. Patient did report that he was having some numbness and tingling, paresthesia-like symptoms on the right side. This appears to be mostly in the right arm. CT head was negative. Neurology has seen the patient and feels that he is experiencing carpal tunnel. No further workup needed from a neurology standpoint, can follow-up at [facility] neuro for possible nerve conduction studies.  Patient's blood pressure is improving with treatment. Workup otherwise was normal other than interstitial edema. He does take Lasix. He will increase his Lasix to double dose for the next 3 days, then follow-up with primary doctor in the office.    Gilda Crease, MD 09/09/15 [phone]

## 2015-09-09 NOTE — ED Notes (Signed)
Dr.Pollina no longer wishes to have pacemaker integrated. [facility] contacted spoke with [name] [name] who is going to cancel request for [name] [name] to come integrate pacemaker.

## 2015-09-09 NOTE — Discharge Instructions (Signed)
Take double dose of Lasix (furosemide) for the next 3 days. Follow-up with your doctor in the office for recheck of blood pressure Monday.  DASH Eating Plan DASH stands for "Dietary Approaches to Stop Hypertension." The DASH eating plan is a healthy eating plan that has been shown to reduce high blood pressure (hypertension). Additional health benefits may include reducing the risk of type 2 diabetes mellitus, heart disease, and stroke. The DASH eating plan may also help with weight loss. WHAT DO I NEED TO KNOW ABOUT THE DASH EATING PLAN? For the DASH eating plan, you will follow these general guidelines:  Choose foods with a percent daily value for sodium of less than 5% (as listed on the food label).  Use salt-free seasonings or herbs instead of table salt or sea salt.  Check with your health care provider or pharmacist before using salt substitutes.  Eat lower-sodium products, often labeled as "lower sodium" or "no salt added."  Eat fresh foods.  Eat more vegetables, fruits, and low-fat dairy products.  Choose whole grains. Look for the word "whole" as the first word in the ingredient list.  Choose fish and skinless chicken or [place] more often than red meat. Limit fish, poultry, and meat to 6 oz (170 g) each day.  Limit sweets, desserts, sugars, and sugary drinks.  Choose heart-healthy fats.  Limit cheese to 1 oz (28 g) per day.  Eat more home-cooked food and less restaurant, buffet, and fast food.  Limit fried foods.  Cook foods using methods other than frying.  Limit canned vegetables. If you do use them, rinse them well to decrease the sodium.  When eating at a restaurant, ask that your food be prepared with less salt, or no salt if possible. WHAT FOODS CAN I EAT? Seek help from a dietitian for individual calorie needs. Grains Whole grain or whole wheat bread. Brown rice. Whole grain or whole wheat pasta. Quinoa, bulgur, and whole grain cereals. Low-sodium cereals.  Corn or whole wheat flour tortillas. Whole grain cornbread. Whole grain crackers. Low-sodium crackers. Vegetables Fresh or frozen vegetables (raw, steamed, roasted, or grilled). Low-sodium or reduced-sodium tomato and vegetable juices. Low-sodium or reduced-sodium tomato sauce and paste. Low-sodium or reduced-sodium canned vegetables.  Fruits All fresh, canned (in natural juice), or frozen fruits. Meat and Other Protein Products Ground beef (85% or leaner), grass-fed beef, or beef trimmed of fat. Skinless chicken or [place]. Ground chicken or [place]. Pork trimmed of fat. All fish and seafood. Eggs. Dried beans, peas, or lentils. Unsalted nuts and seeds. Unsalted canned beans. Dairy Low-fat dairy products, such as skim or 1% milk, 2% or reduced-fat cheeses, low-fat ricotta or cottage cheese, or plain low-fat yogurt. Low-sodium or reduced-sodium cheeses. Fats and Oils Tub margarines without trans fats. Light or reduced-fat mayonnaise and salad dressings (reduced sodium). Avocado. Safflower, olive, or canola oils. Natural peanut or almond butter. Other Unsalted popcorn and pretzels. The items listed above may not be a complete list of recommended foods or beverages. Contact your dietitian for more options. WHAT FOODS ARE NOT RECOMMENDED? Grains White bread. White pasta. White rice. Refined cornbread. Bagels and croissants. Crackers that contain trans fat. Vegetables Creamed or fried vegetables. Vegetables in a cheese sauce. Regular canned vegetables. Regular canned tomato sauce and paste. Regular tomato and vegetable juices. Fruits Dried fruits. Canned fruit in light or heavy syrup. Fruit juice. Meat and Other Protein Products Fatty cuts of meat. Ribs, chicken wings, bacon, sausage, bologna, salami, chitterlings, fatback, hot dogs, bratwurst, and packaged  luncheon meats. Salted nuts and seeds. Canned beans with salt. Dairy Whole or 2% milk, cream, half-and-half, and cream cheese. Whole-fat or  sweetened yogurt. Full-fat cheeses or blue cheese. Nondairy creamers and whipped toppings. Processed cheese, cheese spreads, or cheese curds. Condiments Onion and garlic salt, seasoned salt, table salt, and sea salt. Canned and packaged gravies. Worcestershire sauce. Tartar sauce. Barbecue sauce. Teriyaki sauce. Soy sauce, including reduced sodium. Steak sauce. Fish sauce. Oyster sauce. Cocktail sauce. Horseradish. Ketchup and mustard. Meat flavorings and tenderizers. Bouillon cubes. Hot sauce. Tabasco sauce. Marinades. Taco seasonings. Relishes. Fats and Oils Butter, stick margarine, lard, shortening, ghee, and bacon fat. Coconut, palm kernel, or palm oils. Regular salad dressings. Other Pickles and olives. Salted popcorn and pretzels. The items listed above may not be a complete list of foods and beverages to avoid. Contact your dietitian for more information. WHERE CAN I FIND MORE INFORMATION? National Heart, Lung, and Blood Institute: [employer] Document Released: 11/16/2011 Document Revised: 04/13/2014 Document Reviewed: 10/01/2013 [facility] Patient Information 2015 [place], [place]. This information is not intended to replace advice given to you by your health care provider. Make sure you discuss any questions you have with your health care provider.  Hypertension Hypertension, commonly called high blood pressure, is when the force of blood pumping through your arteries is too strong. Your arteries are the blood vessels that carry blood from your heart throughout your body. A blood pressure reading consists of a higher number over a lower number, such as 110/72. The higher number (systolic) is the pressure inside your arteries when your heart pumps. The lower number (diastolic) is the pressure inside your arteries when your heart relaxes. Ideally you want your blood pressure below 120/80. Hypertension forces your heart to work harder to pump blood. Your  arteries may become narrow or stiff. Having hypertension puts you at risk for heart disease, stroke, and other problems.  RISK FACTORS Some risk factors for high blood pressure are controllable. Others are not.  Risk factors you cannot control include:   Race. You may be at higher risk if you are African American.  Age. Risk increases with age.  Gender. Men are at higher risk than women before age 60 years. After age 59, women are at higher risk than men. Risk factors you can control include:  Not getting enough exercise or physical activity.  Being overweight.  Getting too much fat, sugar, calories, or salt in your diet.  Drinking too much alcohol. SIGNS AND SYMPTOMS Hypertension does not usually cause signs or symptoms. Extremely high blood pressure (hypertensive crisis) may cause headache, anxiety, shortness of breath, and nosebleed. DIAGNOSIS  To check if you have hypertension, your health care provider will measure your blood pressure while you are seated, with your arm held at the level of your heart. It should be measured at least twice using the same arm. Certain conditions can cause a difference in blood pressure between your right and left arms. A blood pressure reading that is higher than normal on one occasion does not mean that you need treatment. If one blood pressure reading is high, ask your health care provider about having it checked again. TREATMENT  Treating high blood pressure includes making lifestyle changes and possibly taking medicine. Living a healthy lifestyle can help lower high blood pressure. You may need to change some of your habits. Lifestyle changes may include:  Following the DASH diet. This diet is high in fruits, vegetables, and whole grains. It is low in salt, red  meat, and added sugars.  Getting at least 2 hours of brisk physical activity every week.  Losing weight if necessary.  Not smoking.  Limiting alcoholic beverages.  Learning ways to  reduce stress. If lifestyle changes are not enough to get your blood pressure under control, your health care provider may prescribe medicine. You may need to take more than one. Work closely with your health care provider to understand the risks and benefits. HOME CARE INSTRUCTIONS  Have your blood pressure rechecked as directed by your health care provider.   Take medicines only as directed by your health care provider. Follow the directions carefully. Blood pressure medicines must be taken as prescribed. The medicine does not work as well when you skip doses. Skipping doses also puts you at risk for problems.   Do not smoke.   Monitor your blood pressure at home as directed by your health care provider. SEEK MEDICAL CARE IF:   You think you are having a reaction to medicines taken.  You have recurrent headaches or feel dizzy.  You have swelling in your ankles.  You have trouble with your vision. SEEK IMMEDIATE MEDICAL CARE IF:  You develop a severe headache or confusion.  You have unusual weakness, numbness, or feel faint.  You have severe chest or abdominal pain.  You vomit repeatedly.  You have trouble breathing. MAKE SURE YOU:   Understand these instructions.  Will watch your condition.  Will get help right away if you are not doing well or get worse. Document Released: 11/27/2005 Document Revised: 04/13/2014 Document Reviewed: 09/19/2013 [facility] Patient Information 2015 [place], [place]. This information is not intended to replace advice given to you by your health care provider. Make sure you discuss any questions you have with your health care provider.

## 2015-09-09 NOTE — Consult Note (Signed)
NEURO HOSPITALIST CONSULT NOTE   Referring physician: Pollina   Reason for Consult: right hand tingling  HPI:                                                                                                                                          [name] [name] is an 74 y.o. male with previous left CEA, known Afib on AC who has been to ED multiple times for right hand and arm tingling.  He was last here this past January.  At that time a full stroke work up was done including: CTA neck showing no significant stenosis and CTA head showing 50% stenosis in bilateral cavernous ICA, Echo showed EF 40-45% no PFO, CT head showing no acute stroke and he is on Pradaxa. Patient returns to ED today due to elevated BP with SBP in the 200'2.  Currently 184/95.  At present time he states he has had "on and off tingling in his right hand and mainly in the thumb to ring finger.  IT comes and goes and he has had this for multiple months.  He has had multiple CT's of head for this reason all which have ben negative. Currently he haas no tingling.   Past Medical History  Diagnosis Date  . Atrial fibrillation     s/p prior DCCV;  Amiodarone Rx.  . CAD (coronary artery disease)     s/p CABG;  cath 10/07:  LM ok, LAD occluded, AV CFX 95%, pRCA occluded; L-LAD, S-OM2/OM3, S-PDA ok  . Cerebrovascular disease     s/p prior Left CEA;  followed by Dr. [name]  . COPD (chronic obstructive pulmonary disease)   . Hyperlipidemia   . Hypertension   . Mitral insufficiency     hx of. not noted on echo 01/2012  . Nephrolithiasis   . Ischemic cardiomyopathy     echo 5/09: EF 55%, mild LAE;  Myoview 11/10: inf and apical scar with mild peri-infarct ischemia, EF 44%. Echo 01/26/12 with EF 20-25% but previously known to be 30% on echoes before last one  . Iliac artery aneurysm, left     followed by Dr. [name]  . Aortic dissection     H/O focal aortic dissection  . LBBB (left bundle branch block)   . CHF  (congestive heart failure)   . Myocardial infarction   . Renal insufficiency     01/2012  . Stroke   . CVA (cerebral infarction)   . DVT (deep venous thrombosis)   . Fall from slipping on wet surface Apr 15, 2015    Fx 3 Valma Cava    Past Surgical History  Procedure Laterality Date  . Uteroscopy    . Coronary artery bypass graft  1998  . Left cea  07/05  .  Appendectomy  09/11/11  . Biventricular defibrillator implantation  08/22/12    SJM Quadra Assura BiV ICD implanted by Dr [name]  . Spine surgery  Sep 27, 2014    Lower Back L 4  and   L 5  . Bi-ventricular implantable cardioverter defibrillator N/A 08/22/2012    Procedure: BI-VENTRICULAR IMPLANTABLE CARDIOVERTER DEFIBRILLATOR  (CRT-D);  Surgeon: [name], MD;  Location: [facility] CATH LAB;  Service: Cardiovascular;  Laterality: N/A;  . Carotid endarterectomy  July  2005    left    Family History  Problem Relation Age of Onset  . Stroke Maternal Uncle      Social History:  reports that he quit smoking about 10 years ago. His smoking use included Cigarettes. He has a 85.5 pack-year smoking history. He has never used smokeless tobacco. He reports that he does not drink alcohol or use illicit drugs.  No Known Allergies  MEDICATIONS:                                                                                                                     No current facility-administered medications for this encounter.   Current Outpatient Prescriptions  Medication Sig Dispense Refill  . amiodarone (PACERONE) 200 MG tablet TAKE 1 TABLET EVERY DAY 90 tablet 2  . amLODipine (NORVASC) 10 MG tablet TAKE 1 TABLET EVERY DAY 90 tablet 2  . dabigatran (PRADAXA) 150 MG CAPS capsule Take 1 capsule (150 mg total) by mouth 2 (two) times daily. 180 capsule 3  . docusate sodium (COLACE) 100 MG capsule Take 1 capsule (100 mg total) by mouth every 12 (twelve) hours. 60 capsule 0  . furosemide (LASIX) 40 MG tablet Take 1 tablet (40 mg total) by mouth  daily. (Patient taking differently: Take 40 mg by mouth daily as needed. ) 30 tablet 12  . hydrALAZINE (APRESOLINE) 50 MG tablet Take 0.5 tablets (25 mg total) by mouth 3 (three) times daily. 135 tablet 3  . lisinopril (PRINIVIL,ZESTRIL) 40 MG tablet TAKE 1 TABLET EVERY DAY 90 tablet 3  . metoprolol succinate (TOPROL-XL) 25 MG 24 hr tablet Take 1 pill daily (Patient taking differently: Take 12.5 mg by mouth daily. ) 45 tablet 3  . Multiple Vitamin (MULITIVITAMIN WITH MINERALS) TABS Take 1 tablet by mouth daily.    . niacin (NIASPAN) 500 MG CR tablet Take 1,000 mg by mouth 2 (two) times daily.     . potassium chloride SA (K-DUR,KLOR-CON) 20 MEQ tablet TAKE 2 TABLETS THREE TIMES DAILY 540 tablet 0  . pravastatin (PRAVACHOL) 80 MG tablet Take 1 tablet (80 mg total) by mouth daily. 90 tablet 2  . albuterol (PROVENTIL HFA;VENTOLIN HFA) 108 (90 BASE) MCG/ACT inhaler Inhale 2 puffs into the lungs every 4 (four) hours as needed for wheezing. 1 Inhaler 0  . ibuprofen (ADVIL,MOTRIN) 200 MG tablet Take 400 mg by mouth every 6 (six) hours as needed for mild pain or moderate pain.    [name] ipratropium-albuterol (DUONEB) 0.5-2.5 (3) MG/3ML SOLN Take 3  mLs by nebulization every 4 (four) hours as needed (for shortness of breath).    [name] levofloxacin (LEVAQUIN) 500 MG tablet Take 1 tablet (500 mg total) by mouth daily. (Patient not taking: Reported on 07/05/2015) 4 tablet 0  . predniSONE (DELTASONE) 5 MG tablet Label  & dispense according to the schedule below. 10 Pills PO for 3 days then, 8 Pills PO for 3 days, 6 Pills PO for 3 days, 4 Pills PO for 3 days, 2 Pills PO for 3 days, 1 Pills PO for 3 days, 1/2 Pill  PO for 3 days then STOP. Total 95 pills. (Patient not taking: Reported on 04/20/2015) 95 tablet 0      ROS:                                                                                                                                       History obtained from the patient  General ROS: negative for - chills,  fatigue, fever, night sweats, weight gain or weight loss Psychological ROS: negative for - behavioral disorder, hallucinations, memory difficulties, mood swings or suicidal ideation Ophthalmic ROS: negative for - blurry vision, double vision, eye pain or loss of vision ENT ROS: negative for - epistaxis, nasal discharge, oral lesions, sore throat, tinnitus or vertigo Allergy and Immunology ROS: negative for - hives or itchy/watery eyes Hematological and Lymphatic ROS: negative for - bleeding problems, bruising or swollen lymph nodes Endocrine ROS: negative for - galactorrhea, hair pattern changes, polydipsia/polyuria or temperature intolerance Respiratory ROS: negative for - cough, hemoptysis, shortness of breath or wheezing Cardiovascular ROS: negative for - chest pain, dyspnea on exertion, edema or irregular heartbeat Gastrointestinal ROS: negative for - abdominal pain, diarrhea, hematemesis, nausea/vomiting or stool incontinence Genito-Urinary ROS: negative for - dysuria, hematuria, incontinence or urinary frequency/urgency Musculoskeletal ROS: negative for - joint swelling or muscular weakness Neurological ROS: as noted in HPI Dermatological ROS: negative for rash and skin lesion changes   Blood pressure 184/95, pulse 64, temperature 98.5 F (36.9 C), temperature source Oral, resp. rate 15, height 6\' 1"  (1.854 m), weight 110.224 kg (243 lb), SpO2 97 %.   Neurologic Examination:                                                                                                      HEENT-  Normocephalic, no lesions, without obvious abnormality.  Normal external eye and conjunctiva.  Normal TM's bilaterally.  Normal auditory canals and external ears. Normal external nose, mucus membranes  and septum.  Normal pharynx. Cardiovascular- irregularly irregular rhythm, pulses palpable throughout   Lungs- chest clear, no wheezing, rales, normal symmetric air entry Abdomen- normal findings: bowel sounds  normal Extremities- no edema Lymph-no adenopathy palpable Musculoskeletal-no joint tenderness, deformity or swelling Skin-warm and dry, no hyperpigmentation, vitiligo, or suspicious lesions  Neurological Examination Mental Status: Alert, oriented, thought content appropriate.  Speech fluent without evidence of aphasia.  Able to follow 3 step commands without difficulty. Cranial Nerves: II: Discs flat bilaterally; Visual fields grossly normal, pupils equal, round, reactive to light and accommodation III,IV, VI: ptosis not present, extra-ocular motions intact bilaterally V,VII: smile symmetric, facial light touch sensation normal bilaterally VIII: hearing normal bilaterally IX,X: uvula rises symmetrically XI: bilateral shoulder shrug XII: midline tongue extension Motor: Right : Upper extremity   5/5    Left:     Upper extremity   5/5  Lower extremity   5/5     Lower extremity   5/5 APB weakness and thenar eminence wasting on the right hand.  Tone and bulk:normal tone throughout; no atrophy noted Sensory: Pinprick and light touch intact throughout, bilaterally with decreased sensation from mid calf to feet and positive Tinnel sign on the right hand.  Deep Tendon Reflexes: 2+ and symmetric throughout and no AJ Plantars: Right: downgoing   Left: downgoing Cerebellar: normal finger-to-nose and normal heel-to-shin test Gait: not tested due to multiple leads.       Lab Results: Basic Metabolic Panel:  Recent Labs Lab 09/09/15 1242  NA 144  K 3.0*  CL 107  CO2 28  GLUCOSE 98  BUN 11  CREATININE 1.11  CALCIUM 8.7*    Liver Function Tests:  Recent Labs Lab 09/09/15 1242  AST 18  ALT 11*  ALKPHOS 84  BILITOT 0.9  PROT 6.3*  ALBUMIN 3.2*   No results for input(s): LIPASE, AMYLASE in the last 168 hours. No results for input(s): AMMONIA in the last 168 hours.  CBC:  Recent Labs Lab 09/09/15 1242  WBC 7.1  NEUTROABS 4.3  HGB 15.2  HCT 46.8  MCV 90.9  PLT 179     Cardiac Enzymes:  Recent Labs Lab 09/09/15 1242  TROPONINI <0.03    Lipid Panel: No results for input(s): CHOL, TRIG, HDL, CHOLHDL, VLDL, LDLCALC in the last 168 hours.  CBG: No results for input(s): GLUCAP in the last 168 hours.  Microbiology: Results for orders placed or performed during the hospital encounter of 03/04/15  Culture, blood (routine x 2)     Status: None   Collection Time: 03/04/15  4:02 AM  Result Value Ref Range Status   Specimen Description BLOOD LEFT ANTECUBITAL  Final   Special Requests BOTTLES DRAWN AEROBIC AND ANAEROBIC 5CC EACH  Final   Culture   Final    NO GROWTH 5 DAYS Performed at [facility]    Report Status 03/10/2015 FINAL  Final  Culture, blood (routine x 2)     Status: None   Collection Time: 03/04/15  4:12 AM  Result Value Ref Range Status   Specimen Description BLOOD RIGHT HAND  Final   Special Requests BOTTLES DRAWN AEROBIC AND ANAEROBIC 5CC EACH  Final   Culture   Final    NO GROWTH 5 DAYS Performed at [facility]    Report Status 03/10/2015 FINAL  Final  Respiratory virus panel     Status: None   Collection Time: 03/04/15  8:34 AM  Result Value Ref Range Status   Respiratory Syncytial  Virus A Negative Negative Final   Respiratory Syncytial Virus B Negative Negative Final   Influenza A Negative Negative Final   Influenza B Negative Negative Final   Parainfluenza 1 Negative Negative Final   Parainfluenza 2 Negative Negative Final   Parainfluenza 3 Negative Negative Final   Metapneumovirus Negative Negative Final   Rhinovirus Negative Negative Final   Adenovirus Negative Negative Final    Comment: (NOTE) Performed At: [facility] [address] [place], [place] [id number] Mila Homer MD [id number]   MRSA PCR Screening     Status: None   Collection Time: 03/04/15  9:27 AM  Result Value Ref Range Status   MRSA by PCR NEGATIVE NEGATIVE Final    Comment:        The GeneXpert MRSA Assay  (FDA approved for NASAL specimens only), is one component of a comprehensive MRSA colonization surveillance program. It is not intended to diagnose MRSA infection nor to guide or monitor treatment for MRSA infections.     Coagulation Studies: No results for input(s): LABPROT, INR in the last 72 hours.  Imaging: Dg Chest 2 View  09/09/2015   CLINICAL DATA:  Headache, dizziness, hypertensive  EXAM: CHEST  2 VIEW  COMPARISON:  03/04/2015  FINDINGS: Evidence of CABG. Mild cardiomegaly noted with central vascular congestion but no overt alveolar edema. A few interstitial Kerley B-lines are noted at the lung bases. No focal opacity or consolidation. No pleural effusion. Left AICD in place. Mild flattening of the hemidiaphragms may suggest emphysema.  IMPRESSION: Mild cardiomegaly with interstitial pulmonary edema.   Electronically Signed   By: Christiana Pellant M.D.   On: 09/09/2015 12:04   Ct Head Wo Contrast  09/09/2015   CLINICAL DATA:  Right-sided weakness since yesterday morning.  EXAM: CT HEAD WITHOUT CONTRAST  TECHNIQUE: Contiguous axial images were obtained from the base of the skull through the vertex without intravenous contrast.  COMPARISON:  01/11/2015  FINDINGS: Stable age related cerebral atrophy, ventriculomegaly and periventricular white matter disease. No extra-axial fluid collections are identified. No CT findings for acute hemispheric infarction or intracranial hemorrhage. No mass lesions. The brainstem and cerebellum are normal.  No acute bony findings. Chronic paranasal sinus disease. The mastoid air cells and middle ear cavities are clear. Stable vascular calcifications.  IMPRESSION: 1. Stable age related cerebral atrophy, ventriculomegaly and periventricular white matter disease. 2. No CT findings for acute hemispheric infarction or intracranial hemorrhage. 3. Chronic pan sinus disease.   Electronically Signed   By: Rudie Meyer M.D.   On: 09/09/2015 12:00       Assessment  and plan per attending neurologist  Felicie Morn PA-C [facility] [phone]  09/09/2015, 1:53 PM   Assessment/Plan: 74 yo M With intermittent right hand numbness, thenar wasting, apbd weakness, +tinnel's sign most consistent with carpal tunnel syndrome. He will need an EMG to confirm diagnosis but this can be done as an outpatient.  1) EMG as outpatient 2) Wrist splint.  3) f/u as previously scheduled with neurology.   Ritta Slot, MD [facility] [phone]  If 7pm- 7am, please page neurology on call as listed in [facility].

## 2015-09-09 NOTE — ED Notes (Signed)
To ED via [employer], from home, pt states "just not feeling right" -- weakness and tingling started yesterday morning, on right side. "just no energy." also states had a fever last night, cough productive for thick sputum. COugh started 2 days ago-- pt has had pneumonia in past.

## 2015-09-09 NOTE — ED Notes (Signed)
IV not charted, but 20 G removed from pt's right forearm (appears to have been placed by EMS)

## 2015-09-10 ENCOUNTER — Telehealth: Payer: Self-pay | Admitting: Cardiology

## 2015-09-10 ENCOUNTER — Ambulatory Visit (INDEPENDENT_AMBULATORY_CARE_PROVIDER_SITE_OTHER): Payer: Medicare Other

## 2015-09-10 DIAGNOSIS — Z95810 Presence of automatic (implantable) cardiac defibrillator: Secondary | ICD-10-CM

## 2015-09-10 DIAGNOSIS — I5022 Chronic systolic (congestive) heart failure: Secondary | ICD-10-CM

## 2015-09-10 NOTE — Telephone Encounter (Signed)
Spoke with pt and reminded pt of remote transmission that is due today. Pt verbalized understanding.   

## 2015-09-13 NOTE — Progress Notes (Signed)
EPIC Encounter for ICM Monitoring  Patient Name: [name] [name] is a 74 y.o. male Date: 09/13/2015 Primary Care Physican: [name], MD Primary Cardiologist: [name] Electrophysiologist: [name] Dry Weight: 241 (taken a week ago) lb       In the past month, have you:  1. Gained more than 2 pounds in a day or more than 5 pounds in a week? No, advised to weigh daily and education given to call if he gains > 2 lbs in a day or 5 pounds in a week.   2. Had changes in your medications (with verification of current medications)? no  3. Had more shortness of breath than is usual for you? no  4. Limited your activity because of shortness of breath? no  5. Not been able to sleep because of shortness of breath? no  6. Had increased swelling in your feet or ankles? no  7. Had symptoms of dehydration (dizziness, dry mouth, increased thirst, decreased urine output) no  8. Had changes in sodium restriction? no  9. Been compliant with medication? Yes   ICM trend:   Follow-up plan: ICM clinic phone appointment on 10/14/2015.  CorVue Thoracic impedance above baseline and patient has taken Lasix 40 mg x 3 days as instructed by ER Physician on 09/09/2015.  He normally takes Lasix as needed.  Patient denied any dehydration symptoms and encouraged to drink moderate amount of fluid.  He is asymptomatic at this time.  He was seen in ER for increase in BP, numbness and tingling on right side.  BP today at home has been 170/?.  He was instructed to follow up with Dr Shary Decamp, PCP and advised to discuss elevated BP with Dr Shary Decamp.    No changes today.          Patient reference and education sent on Low Sodium Eating Plan, Dash Diet and Managing Your High Blood Pressure.  Copy of note sent to patient's primary care physician, primary cardiologist, and device following physician.  Karie Soda, RN, CCM 09/13/2015 10:44 AM

## 2015-10-12 ENCOUNTER — Encounter: Payer: Medicare Other | Admitting: Neurology

## 2015-10-14 ENCOUNTER — Ambulatory Visit (INDEPENDENT_AMBULATORY_CARE_PROVIDER_SITE_OTHER): Payer: Medicare Other

## 2015-10-14 DIAGNOSIS — I5022 Chronic systolic (congestive) heart failure: Secondary | ICD-10-CM | POA: Diagnosis not present

## 2015-10-14 DIAGNOSIS — Z95810 Presence of automatic (implantable) cardiac defibrillator: Secondary | ICD-10-CM | POA: Diagnosis not present

## 2015-10-14 NOTE — Progress Notes (Signed)
EPIC Encounter for ICM Monitoring  Patient Name: [name] [name] is a 74 y.o. male Date: 10/14/2015 Primary Care Physican: [name], MD Primary Cardiologist: [name] Electrophysiologist: [name] Dry Weight: 238 lbs  Bi-V Pacing 97%       In the past month, have you:  1. Gained more than 2 pounds in a day or more than 5 pounds in a week? no  2. Had changes in your medications (with verification of current medications)? no  3. Had more shortness of breath than is usual for you? no  4. Limited your activity because of shortness of breath? no  5. Not been able to sleep because of shortness of breath? no  6. Had increased swelling in your feet or ankles? no  7. Had symptoms of dehydration (dizziness, dry mouth, increased thirst, decreased urine output) no  8. Had changes in sodium restriction? no  9. Been compliant with medication? Yes   ICM trend:  10/14/2015    Follow-up plan: ICM clinic phone appointment on 11/15/2015.  Corvue impedance trending below baseline 09/28/2015 to 10/06/2015 and 10/08/2015 to 10/09/2015 and he denied any HF symptoms. He denied any changes in diet or fluid intake during that time.  Impedance above baseline ~10/12/2015 to transmission date 10/14/2015.  He stated he has not been drinking as much fluids as usual in the last 2 days.  He stated he is feeling fine right now.  He has not taken any prn Furosemide in last month.  Weight is stable. BP has improved in the last month, 140/70.  No changes today.   Copy of note sent to patient's primary care physician, primary cardiologist, and device following physician.  Karie Soda, RN, CCM 10/14/2015 9:17 AM

## 2015-10-19 ENCOUNTER — Ambulatory Visit
Admission: RE | Admit: 2015-10-19 | Discharge: 2015-10-19 | Disposition: A | Discharge status: Home / Self Care | Payer: Medicare Other | Source: Ambulatory Visit | Attending: Family | Admitting: Family

## 2015-10-19 DIAGNOSIS — I714 Abdominal aortic aneurysm, without rupture, unspecified: Secondary | ICD-10-CM

## 2015-10-19 MED ORDER — IOPAMIDOL (ISOVUE-370) INJECTION 76%
75.0000 mL | Freq: Once | INTRAVENOUS | Status: AC | PRN
  Administered 2015-10-19: 75 mL via INTRAVENOUS

## 2015-10-21 ENCOUNTER — Encounter: Payer: Self-pay | Admitting: Vascular Surgery

## 2015-10-24 ENCOUNTER — Emergency Department (HOSPITAL_COMMUNITY)
Admission: EM | Admit: 2015-10-24 | Discharge: 2015-10-24 | Disposition: A | Discharge status: Home / Self Care | Payer: Medicare Other | Attending: Emergency Medicine | Admitting: Emergency Medicine

## 2015-10-24 ENCOUNTER — Encounter (HOSPITAL_COMMUNITY): Payer: Self-pay

## 2015-10-24 ENCOUNTER — Emergency Department (HOSPITAL_COMMUNITY): Payer: Medicare Other

## 2015-10-24 DIAGNOSIS — I2510 Atherosclerotic heart disease of native coronary artery without angina pectoris: Secondary | ICD-10-CM | POA: Insufficient documentation

## 2015-10-24 DIAGNOSIS — I252 Old myocardial infarction: Secondary | ICD-10-CM | POA: Insufficient documentation

## 2015-10-24 DIAGNOSIS — I509 Heart failure, unspecified: Secondary | ICD-10-CM | POA: Insufficient documentation

## 2015-10-24 DIAGNOSIS — Z79899 Other long term (current) drug therapy: Secondary | ICD-10-CM | POA: Insufficient documentation

## 2015-10-24 DIAGNOSIS — E785 Hyperlipidemia, unspecified: Secondary | ICD-10-CM | POA: Diagnosis not present

## 2015-10-24 DIAGNOSIS — Z86718 Personal history of other venous thrombosis and embolism: Secondary | ICD-10-CM | POA: Diagnosis not present

## 2015-10-24 DIAGNOSIS — Z8673 Personal history of transient ischemic attack (TIA), and cerebral infarction without residual deficits: Secondary | ICD-10-CM | POA: Insufficient documentation

## 2015-10-24 DIAGNOSIS — R1030 Lower abdominal pain, unspecified: Secondary | ICD-10-CM | POA: Diagnosis present

## 2015-10-24 DIAGNOSIS — I10 Essential (primary) hypertension: Secondary | ICD-10-CM | POA: Diagnosis not present

## 2015-10-24 DIAGNOSIS — J449 Chronic obstructive pulmonary disease, unspecified: Secondary | ICD-10-CM | POA: Insufficient documentation

## 2015-10-24 DIAGNOSIS — N50812 Left testicular pain: Secondary | ICD-10-CM

## 2015-10-24 DIAGNOSIS — Z87891 Personal history of nicotine dependence: Secondary | ICD-10-CM | POA: Diagnosis not present

## 2015-10-24 DIAGNOSIS — N453 Epididymo-orchitis: Secondary | ICD-10-CM | POA: Diagnosis not present

## 2015-10-24 LAB — URINALYSIS, ROUTINE W REFLEX MICROSCOPIC
GLUCOSE, UA: 100 mg/dL — AB
Hgb urine dipstick: NEGATIVE
KETONES UR: 15 mg/dL — AB
LEUKOCYTES UA: NEGATIVE
Nitrite: NEGATIVE
Protein, ur: 30 mg/dL — AB
SPECIFIC GRAVITY, URINE: 1.023 (ref 1.005–1.030)
UROBILINOGEN UA: 1 mg/dL (ref 0.0–1.0)
pH: 6 (ref 5.0–8.0)

## 2015-10-24 LAB — CBC WITH DIFFERENTIAL/PLATELET
BASOS PCT: 1 %
Basophils Absolute: 0 10*3/uL (ref 0.0–0.1)
Eosinophils Absolute: 0.2 10*3/uL (ref 0.0–0.7)
Eosinophils Relative: 3 %
HEMATOCRIT: 45.9 % (ref 39.0–52.0)
HEMOGLOBIN: 14.9 g/dL (ref 13.0–17.0)
LYMPHS PCT: 32 %
Lymphs Abs: 2.1 10*3/uL (ref 0.7–4.0)
MCH: 29.2 pg (ref 26.0–34.0)
MCHC: 32.5 g/dL (ref 30.0–36.0)
MCV: 89.8 fL (ref 78.0–100.0)
MONOS PCT: 7 %
Monocytes Absolute: 0.4 10*3/uL (ref 0.1–1.0)
NEUTROS ABS: 3.7 10*3/uL (ref 1.7–7.7)
NEUTROS PCT: 57 %
Platelets: 160 10*3/uL (ref 150–400)
RBC: 5.11 MIL/uL (ref 4.22–5.81)
RDW: 14.3 % (ref 11.5–15.5)
WBC: 6.5 10*3/uL (ref 4.0–10.5)

## 2015-10-24 LAB — URINE MICROSCOPIC-ADD ON

## 2015-10-24 LAB — BASIC METABOLIC PANEL
ANION GAP: 10 (ref 5–15)
BUN: 11 mg/dL (ref 6–20)
CHLORIDE: 106 mmol/L (ref 101–111)
CO2: 24 mmol/L (ref 22–32)
CREATININE: 1.16 mg/dL (ref 0.61–1.24)
Calcium: 8.3 mg/dL — ABNORMAL LOW (ref 8.9–10.3)
GFR calc non Af Amer: >60 mL/min (ref >60)
Glucose, Bld: 96 mg/dL (ref 65–99)
Potassium: 2.6 mmol/L — CL (ref 3.5–5.1)
Sodium: 140 mmol/L (ref 135–145)

## 2015-10-24 MED ORDER — HYDROCODONE-ACETAMINOPHEN 5-325 MG PO TABS
1.0000 | ORAL_TABLET | Freq: Once | ORAL | Status: AC
  Administered 2015-10-24: 1 via ORAL
  Filled 2015-10-24: qty 1

## 2015-10-24 MED ORDER — HYDROCODONE-ACETAMINOPHEN 5-325 MG PO TABS: 1.0000 | ORAL_TABLET | Freq: Four times a day (QID) | ORAL | Status: DC | PRN

## 2015-10-24 MED ORDER — LEVOFLOXACIN 500 MG PO TABS
500.0000 mg | ORAL_TABLET | Freq: Once | ORAL | Status: AC
  Administered 2015-10-24: 500 mg via ORAL
  Filled 2015-10-24: qty 1

## 2015-10-24 MED ORDER — LEVOFLOXACIN 500 MG PO TABS: 500.0000 mg | ORAL_TABLET | Freq: Every day | ORAL | Status: DC

## 2015-10-24 MED ORDER — POTASSIUM CHLORIDE CRYS ER 20 MEQ PO TBCR
60.0000 meq | EXTENDED_RELEASE_TABLET | Freq: Once | ORAL | Status: AC
  Administered 2015-10-24: 60 meq via ORAL
  Filled 2015-10-24: qty 3

## 2015-10-24 NOTE — ED Notes (Signed)
Per EMS: Pt complaining of R groin pain. 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 Increased pain last few days. 6/10 pain today. Pt reports having a scan on 11/8, has not yet heard from results. BP 170/110 enroute.

## 2015-10-24 NOTE — Discharge Instructions (Signed)
You have infection of the area around your testes. Take the meds provided.  Return to the Er if the pain is worse.   Orchitis Orchitis is swelling (inflammation) of a testicle caused by infection. Testicles are the male organs that produce sperm. The testicles are held in a fleshy sac (scrotum) located behind the penis. Orchitis usually affects only one testicle, but it can occur in both. The condition can develop suddenly. Orchitis can be caused by many different kinds of bacteria and viruses. CAUSES Orchitis can be caused by either a bacterial or viral infection. Bacterial Infections  These often occur along with an infection of the coiled tube that collects sperm and sits on top of the testicle (epididymis).  In men who are not sexually active, bacterial orchitis usually starts as a urinary tract infection and spreads to the testicle.  In sexually active men, sexually transmitted infections are the most common cause of bacterial orchitis. These can include:  Gonorrhea.  Chlamydia. Viral Infections  Mumps is still the most common cause of viral orchitis, though mumps is now rare in many areas because of vaccination.  Other viruses that can cause orchitis include:  The chickenpox virus (varicella-zoster virus).  The virus that causes mononucleosis (Epstein-Barr virus). RISK FACTORS Boys and men who have not been vaccinated against mumps are at risk for mumps orchitis. Risk factors for bacterial orchitis include:  Frequent urinary tract infections.  High-risk sexual behaviors.  Having a sexual partner with a sexually transmitted infection.  Having had urinary tract surgery.  Using a tube passed through the penis to drain urine (Foley catheter).  An enlarged prostate gland. SIGNS AND SYMPTOMS The most common symptoms of orchitis are swelling and pain in the scrotum. Other signs and symptoms may include:  Feeling generally sick (malaise).  Fever and chills.  Painful  urination.  Painful ejaculation.  Blood or discharge from the penis.  Nausea.  Headache.  Fatigue. DIAGNOSIS Your health care provider may suspect orchitis if you have a painful, swollen testicle along with other signs and symptoms of the condition. A physical exam will be done. Tests may also be done to help your health care provider make a diagnosis. These may include:  A blood test to check for signs of infection.  A urine test to check for a urinary tract infection.  Using a swab to collect a fluid sample from the tip of the penis to test for sexually transmitted infections.  Taking an image of the testicle using sound waves and a computer (testicular ultrasound). TREATMENT Treatment of orchitis depends on the cause. For orchitis caused by a bacterial infection, your health care provider will most likely prescribe antibiotic medicines. Bacterial infections usually clear up within a few days. Both viral infections and bacterial infections may be treated with:  Bed rest.  Anti-inflammatory medicines.  Pain medicines.  Elevating the scrotum and applying ice. HOME CARE INSTRUCTIONS  Rest as directed by your health care provider.  Take medicines only as directed by your health care provider.  If you were prescribed an antibiotic medicine, finish it all even if you start to feel better.  Elevate your scrotum and apply ice as directed:  Put ice in a plastic bag.  Place a small towel or pillow between your legs.  Rest your scrotum on the pillow or towel.  Place another towel between your skin and the plastic bag.  Leave the ice on for 20 minutes, 2-3 times a day. SEEK MEDICAL CARE IF:  You have a fever.  Pain and swelling have not gotten better after 3 days. SEEK IMMEDIATE MEDICAL CARE IF:  Your pain is getting worse.  The swelling in your testicle gets worse.   This information is not intended to replace advice given to you by your health care provider. Make  sure you discuss any questions you have with your health care provider.   Document Released: 11/24/2000 Document Revised: 12/18/2014 Document Reviewed: 04/16/2014 Elsevier Interactive Patient Education [employer]

## 2015-10-24 NOTE — ED Provider Notes (Signed)
CSN: [id number]     Arrival date & time 10/24/15  1731 History   First MD Initiated Contact with Patient 10/24/15 1843     Chief Complaint  Patient presents with  . Groin Pain     (Consider location/radiation/quality/duration/timing/severity/associated sxs/prior Treatment) HPI Comments: Pt with hx of AAA comes in with groin pain. Groin plain present for the past 2 weeks, but is getting worse, so he came to the ER. Pt denies any uti like sx, penile discharge, trauma, STD hx or risk. Pt has no back pain or abd pain. He has throbbing pain that starts in the inguinal region and radiates to his groin. No n/v/f/c. No mass.   ROS 10 Systems reviewed and are negative for acute change except as noted in the HPI.     The history is provided by the patient.    Past Medical History  Diagnosis Date  . Atrial fibrillation (HCC)     s/p prior DCCV;  Amiodarone Rx.  . CAD (coronary artery disease)     s/p CABG;  cath 10/07:  LM ok, LAD occluded, AV CFX 95%, pRCA occluded; L-LAD, S-OM2/OM3, S-PDA ok  . Cerebrovascular disease     s/p prior Left CEA;  followed by Dr. [name]  . COPD (chronic obstructive pulmonary disease) (HCC)   . Hyperlipidemia   . Hypertension   . Mitral insufficiency     hx of. not noted on echo 01/2012  . Nephrolithiasis   . Ischemic cardiomyopathy     echo 5/09: EF 55%, mild LAE;  Myoview 11/10: inf and apical scar with mild peri-infarct ischemia, EF 44%. Echo 01/26/12 with EF 20-25% but previously known to be 30% on echoes before last one  . Iliac artery aneurysm, left (HCC)     followed by Dr. [name]  . Aortic dissection (HCC)     H/O focal aortic dissection  . LBBB (left bundle branch block)   . CHF (congestive heart failure) (HCC)   . Myocardial infarction (HCC)   . Renal insufficiency     01/2012  . Stroke (HCC)   . CVA (cerebral infarction)   . DVT (deep venous thrombosis) (HCC)   . Fall from slipping on wet surface Apr 15, 2015    Fx 3 Valma Cava   Past  Surgical History  Procedure Laterality Date  . Uteroscopy    . Coronary artery bypass graft  1998  . Left cea  07/05  . Appendectomy  09/11/11  . Biventricular defibrillator implantation  08/22/12    SJM Quadra Assura BiV ICD implanted by Dr [name]  . Spine surgery  Sep 27, 2014    Lower Back L 4  and   L 5  . Bi-ventricular implantable cardioverter defibrillator N/A 08/22/2012    Procedure: BI-VENTRICULAR IMPLANTABLE CARDIOVERTER DEFIBRILLATOR  (CRT-D);  Surgeon: [name], MD;  Location: [facility] CATH LAB;  Service: Cardiovascular;  Laterality: N/A;  . Carotid endarterectomy  July  2005    left   Family History  Problem Relation Age of Onset  . Stroke Maternal Uncle    Social History  Substance Use Topics  . Smoking status: Former Smoker -- 1.50 packs/day for 57 years    Types: Cigarettes    Quit date: 12/11/2004  . Smokeless tobacco: Never Used  . Alcohol Use: No     Comment: 6 yrs ago     Review of Systems    Allergies  Review of patient's allergies indicates no known allergies.  Home Medications  Prior to Admission medications   Medication Sig Start Date End Date Taking? Authorizing Provider  albuterol (PROVENTIL HFA;VENTOLIN HFA) 108 (90 BASE) MCG/ACT inhaler Inhale 2 puffs into the lungs every 4 (four) hours as needed for wheezing. 03/29/13  Yes Cathren Laine, MD  amiodarone (PACERONE) 200 MG tablet TAKE 1 TABLET EVERY DAY 08/02/15  Yes Lewayne Bunting, MD  amLODipine (NORVASC) 10 MG tablet TAKE 1 TABLET EVERY DAY 08/02/15  Yes Lewayne Bunting, MD  dabigatran (PRADAXA) 150 MG CAPS capsule Take 1 capsule (150 mg total) by mouth 2 (two) times daily. 03/08/15  Yes Lewayne Bunting, MD  docusate sodium (COLACE) 100 MG capsule Take 1 capsule (100 mg total) by mouth every 12 (twelve) hours. 06/04/14  Yes Rolland Porter, MD  furosemide (LASIX) 40 MG tablet Take 1 tablet (40 mg total) by mouth daily. Patient taking differently: Take 40 mg by mouth daily as needed.  03/10/15  Yes Lewayne Bunting, MD  hydrALAZINE (APRESOLINE) 50 MG tablet Take 0.5 tablets (25 mg total) by mouth 3 (three) times daily. 12/15/14  Yes Lewayne Bunting, MD  ibuprofen (ADVIL,MOTRIN) 200 MG tablet Take 400 mg by mouth every 6 (six) hours as needed for mild pain or moderate pain.   Yes Historical Provider, MD  ipratropium-albuterol (DUONEB) 0.5-2.5 (3) MG/3ML SOLN Take 3 mLs by nebulization every 4 (four) hours as needed (for shortness of breath).   Yes Historical Provider, MD  lisinopril (PRINIVIL,ZESTRIL) 40 MG tablet TAKE 1 TABLET EVERY DAY 12/29/14  Yes Lewayne Bunting, MD  metoprolol succinate (TOPROL-XL) 25 MG 24 hr tablet Take 1 pill daily Patient taking differently: Take 12.5 mg by mouth daily.  03/06/15  Yes Leroy Sea, MD  Multiple Vitamin (MULITIVITAMIN WITH MINERALS) TABS Take 1 tablet by mouth daily.   Yes Historical Provider, MD  niacin (NIASPAN) 500 MG CR tablet Take 1,000 mg by mouth 2 (two) times daily.    Yes Historical Provider, MD  potassium chloride SA (K-DUR,KLOR-CON) 20 MEQ tablet TAKE 2 TABLETS THREE TIMES DAILY 04/13/15  Yes Lewayne Bunting, MD  pravastatin (PRAVACHOL) 80 MG tablet Take 1 tablet (80 mg total) by mouth daily. 06/15/15  Yes Lewayne Bunting, MD  HYDROcodone-acetaminophen (NORCO/VICODIN) 5-325 MG tablet Take 1 tablet by mouth every 6 (six) hours as needed. 10/24/15   Derwood Kaplan, MD  levofloxacin (LEVAQUIN) 500 MG tablet Take 1 tablet (500 mg total) by mouth daily. 10/24/15   Derwood Kaplan, MD  predniSONE (DELTASONE) 5 MG tablet Label  & dispense according to the schedule below. 10 Pills PO for 3 days then, 8 Pills PO for 3 days, 6 Pills PO for 3 days, 4 Pills PO for 3 days, 2 Pills PO for 3 days, 1 Pills PO for 3 days, 1/2 Pill  PO for 3 days then STOP. Total 95 pills. Patient not taking: Reported on 04/20/2015 03/06/15   Leroy Sea, MD   BP 171/105 mmHg  Pulse 74  Temp(Src) 98.1 F (36.7 C) (Oral)  Resp 11  SpO2 95% Physical Exam  Constitutional: He  is oriented to person, place, and time. He appears well-developed.  HENT:  Head: Atraumatic.  Neck: Neck supple.  Cardiovascular: Normal rate.   Pulmonary/Chest: Effort normal.  Abdominal: Soft. He exhibits no distension and no mass. There is tenderness. There is no guarding.  i didn't feel a palpable mass. Pt has no hernia.   Genitourinary:  GU exam reveals right testes being tender to palpation, no  rash, no mass, no edema.  Neurological: He is alert and oriented to person, place, and time.  Skin: Skin is warm.  Nursing note and vitals reviewed.   ED Course  Procedures (including critical care time) Labs Review Labs Reviewed  BASIC METABOLIC PANEL - Abnormal; Notable for the following:    Potassium 2.6 (*)    Calcium 8.3 (*)    All other components within normal limits  URINALYSIS, ROUTINE W REFLEX MICROSCOPIC (NOT AT [facility]) - Abnormal; Notable for the following:    Color, Urine AMBER (*)    Glucose, UA 100 (*)    Bilirubin Urine SMALL (*)    Ketones, ur 15 (*)    Protein, ur 30 (*)    All other components within normal limits  URINE MICROSCOPIC-ADD ON - Abnormal; Notable for the following:    Casts HYALINE CASTS (*)    All other components within normal limits  URINE CULTURE  CBC WITH DIFFERENTIAL/PLATELET    Imaging Review US Scrotum  10/24/2015  CLINICAL DATA:  Left scrotal tenderness. EXAM: SCROTAL ULTRASOUND DOPPLER ULTRASOUND OF THE TESTICLES TECHNIQUE: Complete ultrasound examination of the testicles, epididymis, and other scrotal structures was performed. Color and spectral Doppler ultrasound were also utilized to evaluate blood flow to the testicles. COMPARISON:  10/19/2015 CT scan of the pelvis FINDINGS: Pulsed Doppler interrogation of both testes demonstrates normal low resistance arterial and venous waveforms bilaterally. Minimal hyperemia of the right scrotum with respect to the left but a well-defined arterial and venous flows on the left. Measurements: 3.8 by  2.8 by 3.0 cm. No mass or microlithiasis visualized. Left testicle Measurements: 4.1 by 2.8 by 2.3 cm. No mass or microlithiasis visualized. Right epididymis:  Normal in size and appearance. Left epididymis: Epididymal cyst or spermatocele measuring 1.4 cm in long axis. Hydrocele:  Absent Varicocele:  Absent IMPRESSION: 1. No findings of torsion. Questionable hyperemia in the right testicle compared to the left, but the right side is not symptomatic and accordingly I doubt that this is a manifestation of orchitis. There is also no significant abnormal echogenicity in either testicle. There is normal arterial and venous waveforms in both testicles. 2. Epididymal cyst or spermatocele in the left epididymis, 1.4 cm in long axis. Electronically Signed   By: Gaylyn Rong M.D.   On: 10/24/2015 20:44   [place] Art/ven Flow Abd Pelv Doppler  10/24/2015  CLINICAL DATA:  Left scrotal tenderness. EXAM: SCROTAL ULTRASOUND DOPPLER ULTRASOUND OF THE TESTICLES TECHNIQUE: Complete ultrasound examination of the testicles, epididymis, and other scrotal structures was performed. Color and spectral Doppler ultrasound were also utilized to evaluate blood flow to the testicles. COMPARISON:  10/19/2015 CT scan of the pelvis FINDINGS: Pulsed Doppler interrogation of both testes demonstrates normal low resistance arterial and venous waveforms bilaterally. Minimal hyperemia of the right scrotum with respect to the left but a well-defined arterial and venous flows on the left. Measurements: 3.8 by 2.8 by 3.0 cm. No mass or microlithiasis visualized. Left testicle Measurements: 4.1 by 2.8 by 2.3 cm. No mass or microlithiasis visualized. Right epididymis:  Normal in size and appearance. Left epididymis: Epididymal cyst or spermatocele measuring 1.4 cm in long axis. Hydrocele:  Absent Varicocele:  Absent IMPRESSION: 1. No findings of torsion. Questionable hyperemia in the right testicle compared to the left, but the right side is not  symptomatic and accordingly I doubt that this is a manifestation of orchitis. There is also no significant abnormal echogenicity in either testicle. There is normal arterial and venous  waveforms in both testicles. 2. Epididymal cyst or spermatocele in the left epididymis, 1.4 cm in long axis. Electronically Signed   By: Gaylyn Rong M.D.   On: 10/24/2015 20:44   I have personally reviewed and evaluated these images and lab results as part of my medical decision-making.   EKG Interpretation None      MDM   Final diagnoses:  Epididymo-orchitis without abscess    Pt with subacute scrotal pain and right lower quadrant pain. Has AAA hx - but really the pain is not consistent with torsion. Also, his pain has been present for 2 weeks and he had a CT angio abd just few days back - and it was neg for acute changes. US scrotum ordered - and concerns for epididymitis. Will tx with levo. PCP f/u given.     Derwood Kaplan, MD 10/24/15 [id number]

## 2015-10-25 ENCOUNTER — Other Ambulatory Visit: Payer: Self-pay | Admitting: *Deleted

## 2015-10-25 DIAGNOSIS — Z48812 Encounter for surgical aftercare following surgery on the circulatory system: Secondary | ICD-10-CM

## 2015-10-25 DIAGNOSIS — I6523 Occlusion and stenosis of bilateral carotid arteries: Secondary | ICD-10-CM

## 2015-10-26 ENCOUNTER — Ambulatory Visit (INDEPENDENT_AMBULATORY_CARE_PROVIDER_SITE_OTHER): Payer: Medicare Other | Admitting: Vascular Surgery

## 2015-10-26 ENCOUNTER — Ambulatory Visit (HOSPITAL_COMMUNITY)
Admission: RE | Admit: 2015-10-26 | Discharge: 2015-10-26 | Disposition: A | Discharge status: Home / Self Care | Payer: Medicare Other | Source: Ambulatory Visit | Attending: Vascular Surgery | Admitting: Vascular Surgery

## 2015-10-26 ENCOUNTER — Encounter: Payer: Self-pay | Admitting: Vascular Surgery

## 2015-10-26 VITALS — BP 158/100 | HR 77 | Ht 73.0 in | Wt 231.8 lb

## 2015-10-26 DIAGNOSIS — I6340 Cerebral infarction due to embolism of unspecified cerebral artery: Secondary | ICD-10-CM | POA: Diagnosis not present

## 2015-10-26 DIAGNOSIS — Z9889 Other specified postprocedural states: Secondary | ICD-10-CM | POA: Diagnosis not present

## 2015-10-26 DIAGNOSIS — I714 Abdominal aortic aneurysm, without rupture, unspecified: Secondary | ICD-10-CM

## 2015-10-26 DIAGNOSIS — I6523 Occlusion and stenosis of bilateral carotid arteries: Secondary | ICD-10-CM | POA: Diagnosis not present

## 2015-10-26 DIAGNOSIS — Z48812 Encounter for surgical aftercare following surgery on the circulatory system: Secondary | ICD-10-CM

## 2015-10-26 NOTE — Progress Notes (Signed)
Vascular and Vein Specialist of [facility]  Patient name: [name] [name] MRN: [id number] DOB: June 20, 1941 Sex: male  REASON FOR VISIT: Follow-up abdominal aortic aneurysm  HPI: [name] [name] is a 74 y.o. male gentleman seen today for discussion of infrarenal abdominal aortic aneurysm. He is here today with his daughter. He has no symptoms referable to his aneurysm. He is quite concerned and anxious regarding this diagnosis. His brother-in-law died probably from a ruptured aneurysm while in his presence. He reports this is a constant concern regarding his risk for rupture. He has been relatively stable from a cardiac standpoint. He does have known COPD as well. He is status post left carotid endarterectomy 10 years ago and remained stable from this standpoint  Past Medical History  Diagnosis Date  . Atrial fibrillation (HCC)     s/p prior DCCV;  Amiodarone Rx.  . CAD (coronary artery disease)     s/p CABG;  cath 10/07:  LM ok, LAD occluded, AV CFX 95%, pRCA occluded; L-LAD, S-OM2/OM3, S-PDA ok  . Cerebrovascular disease     s/p prior Left CEA;  followed by Dr. [name]  . COPD (chronic obstructive pulmonary disease) (HCC)   . Hyperlipidemia   . Hypertension   . Mitral insufficiency     hx of. not noted on echo 01/2012  . Nephrolithiasis   . Ischemic cardiomyopathy     echo 5/09: EF 55%, mild LAE;  Myoview 11/10: inf and apical scar with mild peri-infarct ischemia, EF 44%. Echo 01/26/12 with EF 20-25% but previously known to be 30% on echoes before last one  . Iliac artery aneurysm, left (HCC)     followed by Dr. [name]  . Aortic dissection (HCC)     H/O focal aortic dissection  . LBBB (left bundle branch block)   . CHF (congestive heart failure) (HCC)   . Myocardial infarction (HCC)   . Renal insufficiency     01/2012  . Stroke (HCC)   . CVA (cerebral infarction)   . DVT (deep venous thrombosis) (HCC)   . Fall from slipping on wet surface Apr 15, 2015    Fx 3 Vertabrea     Family History  Problem Relation Age of Onset  . Stroke Maternal Uncle     SOCIAL HISTORY: Social History  Substance Use Topics  . Smoking status: Former Smoker -- 1.50 packs/day for 57 years    Types: Cigarettes    Quit date: 12/11/2004  . Smokeless tobacco: Never Used  . Alcohol Use: No     Comment: 6 yrs ago     No Known Allergies  Current Outpatient Prescriptions  Medication Sig Dispense Refill  . albuterol (PROVENTIL HFA;VENTOLIN HFA) 108 (90 BASE) MCG/ACT inhaler Inhale 2 puffs into the lungs every 4 (four) hours as needed for wheezing. 1 Inhaler 0  . amiodarone (PACERONE) 200 MG tablet TAKE 1 TABLET EVERY DAY 90 tablet 2  . amLODipine (NORVASC) 10 MG tablet TAKE 1 TABLET EVERY DAY 90 tablet 2  . dabigatran (PRADAXA) 150 MG CAPS capsule Take 1 capsule (150 mg total) by mouth 2 (two) times daily. 180 capsule 3  . docusate sodium (COLACE) 100 MG capsule Take 1 capsule (100 mg total) by mouth every 12 (twelve) hours. 60 capsule 0  . furosemide (LASIX) 40 MG tablet Take 1 tablet (40 mg total) by mouth daily. (Patient taking differently: Take 40 mg by mouth daily as needed. ) 30 tablet 12  . hydrALAZINE (APRESOLINE) 50 MG tablet Take 0.5  tablets (25 mg total) by mouth 3 (three) times daily. 135 tablet 3  . HYDROcodone-acetaminophen (NORCO/VICODIN) 5-325 MG tablet Take 1 tablet by mouth every 6 (six) hours as needed. 10 tablet 0  . ibuprofen (ADVIL,MOTRIN) 200 MG tablet Take 400 mg by mouth every 6 (six) hours as needed for mild pain or moderate pain.    [name] ipratropium-albuterol (DUONEB) 0.5-2.5 (3) MG/3ML SOLN Take 3 mLs by nebulization every 4 (four) hours as needed (for shortness of breath).    [name] levofloxacin (LEVAQUIN) 500 MG tablet Take 1 tablet (500 mg total) by mouth daily. 10 tablet 0  . lisinopril (PRINIVIL,ZESTRIL) 40 MG tablet TAKE 1 TABLET EVERY DAY 90 tablet 3  . metoprolol succinate (TOPROL-XL) 25 MG 24 hr tablet Take 1 pill daily (Patient taking differently: Take  12.5 mg by mouth daily. ) 45 tablet 3  . Multiple Vitamin (MULITIVITAMIN WITH MINERALS) TABS Take 1 tablet by mouth daily.    . niacin (NIASPAN) 500 MG CR tablet Take 1,000 mg by mouth 2 (two) times daily.     . potassium chloride SA (K-DUR,KLOR-CON) 20 MEQ tablet TAKE 2 TABLETS THREE TIMES DAILY 540 tablet 0  . pravastatin (PRAVACHOL) 80 MG tablet Take 1 tablet (80 mg total) by mouth daily. 90 tablet 2  . predniSONE (DELTASONE) 5 MG tablet Label  & dispense according to the schedule below. 10 Pills PO for 3 days then, 8 Pills PO for 3 days, 6 Pills PO for 3 days, 4 Pills PO for 3 days, 2 Pills PO for 3 days, 1 Pills PO for 3 days, 1/2 Pill  PO for 3 days then STOP. Total 95 pills. 95 tablet 0   No current facility-administered medications for this visit.    REVIEW OF SYSTEMS:   denotes positive finding,  denotes negative finding Cardiac  Comments:  Chest pain or chest pressure:    Shortness of breath upon exertion: x   Short of breath when lying flat:    Irregular heart rhythm:        Vascular    Pain in calf, thigh, or hip brought on by ambulation:    Pain in feet at night that wakes you up from your sleep:     Blood clot in your veins:    Leg swelling:         Pulmonary    Oxygen at home:    Productive cough:     Wheezing:  x       Neurologic    Sudden weakness in arms or legs:     Sudden numbness in arms or legs:     Sudden onset of difficulty speaking or slurred speech:    Temporary loss of vision in one eye:     Problems with dizziness:         Gastrointestinal    Blood in stool:     Vomited blood:         Genitourinary    Burning when urinating:     Blood in urine:        Psychiatric    Major depression:         Hematologic    Bleeding problems:    Problems with blood clotting too easily:        Skin    Rashes or ulcers:        Constitutional    Fever or chills:      PHYSICAL EXAM: Filed Vitals:   10/26/15 1502 10/26/15 1505  BP: 156/102  158/100  Pulse: 77   Height:  (1.854 m)   Weight: 231 lb 12.8 oz (105.144 kg)   SpO2: 96%     GENERAL: The patient is a well-nourished male, in no acute distress. The vital signs are documented above. CARDIAC: There is a irregular rate and rhythm.  VASCULAR: Palpable radial and femoral pulses bilaterally. PULMONARY: There is good air exchange bilaterally without wheezing or rales. ABDOMEN: Soft and non-tender with normal pitched bowel sounds. I do not palpate an aneurysm MUSCULOSKELETAL: There are no major deformities or cyanosis. NEUROLOGIC: No focal weakness or paresthesias are detected. SKIN: There are no ulcers or rashes noted. PSYCHIATRIC: The patient has a normal affect. Carotid arteries without bruits with well-healed left neck incision  DATA:  Carotid duplex today reveals no evidence of stenosis. There is some soft plaque giving less than 40% stenosis of his left endarterectomy site which was not noted on prior study.  Recent CT scan was reviewed with the patient. This does show maximal diameter 5.1 cm. This is up continued to be slightly enlarging by ultrasound. His most recent CT scan from 4 years ago was 4.1 cm.  MEDICAL ISSUES: Had a very long discussion with the patient and his daughter present. He is quite concerned regarding his aneurysm. I explained that at 5.1 cm is annual risk for rupture is probably less than 5%. Explained that the standard recommendation for elective repair was between 5.0 and 5.1 cm depending on overall health. He does have significant cardiac and pulmonary compromise. In further review of his CT scan he does have anatomy below the level renal arteries that may preclude him from being a stent graft candidate. I explained the magnitude of open aneurysm repair versus stent graft repair. He is quite adamant that he wishes to have the aneurysm fixed even if it would require open repair. He feels that he has a better chance of tolerating this procedure  mouth and several years now. He is scheduled to see Dr. Chauncy Passy in the next 1-2 weeks for scheduled pulmonary follow-up. We will also schedule point first and see Dr. [name] for cardiac clearance. I will review his films with the device manufacturer to determine if we feel that the reverse taper at his proximal infrarenal aorta is treatable with stent grafting. I will communicate this with Mr. Bradly once we have these other evaluations. It may be that he is felt to be a prohibitive operative risk and we will discuss this with him as well No Follow-up on file.   Gretta Began [facility] [place]: [phone]

## 2015-10-27 NOTE — Addendum Note (Signed)
Addended by: Adria Dill L on: 10/27/2015 11:31 AM   Modules accepted: Orders

## 2015-10-28 NOTE — Addendum Note (Signed)
Addended by: Adria Dill L on: 10/28/2015 11:43 AM   Modules accepted: Orders

## 2015-10-29 ENCOUNTER — Encounter: Payer: Self-pay | Admitting: Emergency Medicine

## 2015-10-29 ENCOUNTER — Telehealth: Payer: Self-pay | Admitting: Emergency Medicine

## 2015-10-29 ENCOUNTER — Ambulatory Visit (INDEPENDENT_AMBULATORY_CARE_PROVIDER_SITE_OTHER): Payer: Medicare Other | Admitting: Emergency Medicine

## 2015-10-29 VITALS — BP 150/88 | HR 79 | Ht 73.0 in | Wt 235.0 lb

## 2015-10-29 DIAGNOSIS — I6340 Cerebral infarction due to embolism of unspecified cerebral artery: Secondary | ICD-10-CM | POA: Diagnosis not present

## 2015-10-29 DIAGNOSIS — J449 Chronic obstructive pulmonary disease, unspecified: Secondary | ICD-10-CM

## 2015-10-29 MED ORDER — TIOTROPIUM BROMIDE MONOHYDRATE 18 MCG IN CAPS: 18.0000 ug | ORAL_CAPSULE | Freq: Every day | RESPIRATORY_TRACT | Status: DC

## 2015-10-29 NOTE — Assessment & Plan Note (Addendum)
Mild obstruction on spirometry from 2012. He only uses DuoNeb occasionally, is not significantly limited, but he does have occasional exacerbations that required prednisone and antibiotics.. He continues to smoke and I discussed with him that he needs to stop at least 3 weeks prior to any surgery, and  Altogether. I will check spirometry today to compare with 2012 and consider treating him with Spiriva leading up to and in the weeks following his AAA repair. He is a mildly increased risk for surgery and general anesthesia but there is nothing to preclude the procedure if the benefits outweigh his risks.   Addendum: His spirometry today shows an FEV1 of 59% of predicted, significant decrease compared with 2012. I will start him on Spiriva and he likely needs this long-term. He is at moderately increased risk for anesthesia based on these results and his clinical symptoms. I suspect that we can improve his daily symptoms and his exacerbation frequency with bronchodilators.

## 2015-10-29 NOTE — Progress Notes (Signed)
Subjective:    Patient ID: [name] [name], male    DOB: 12/26/1940, 74 y.o.   MRN: [id number] HPI 74 yo man, former tobacco (80+ pk-yrs), hx of CAD, HTN, A Fib, carotid dz. Was hospitalized 9-09/2011 for appendectomy, and during that hosp was found to have hypoxemia. Pulm consulted and he was suspected to have COPD + restriction due to abd compliance. He is not quite back up to usual functional capacity yet. He describes exertional SOB after walking 200 ft. No real cough. On coumadin for A Fib. He has had PFT at [facility] several yrs ago. Not on BD's at this time.   ROV 11/14/11 -- Hx Tobacco use, dyspnea, returns to eval SOB following his PFT today. Shows mild AFL, no BD response, normal volumes. He has been dieting, has lost about 20 lbs since hospitalization. His breathing is better - still with some limitations but able to do more.   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 Follows at [facility] cards for CAD, HTN, A Fib. Since last time he has had a pacer placed, followed by Dr [name]. He had some SOB that improved when the pacer was placed. He is not on scheduled BD's at this time. He sometimes has wheezing, no associated CP or tightness. He used SABA a few times but never really helped him. He has put back on about 15 lbs.   ROV 02/10/14 -- hx COPD, mild AFL. Also with CAD, HTN, A Fib s/p pacer. He returns today and reports that he was treated for an AE and CAP about 1 month ago. He had wheezing, coughing, non-productive. He was treated with corticosteroids and abx. He now feels better. He hears some wheeze. He uses SABA rarely, more often in the Summer. He is on DuoNebs bid that he started 3-4 weeks ago. He believes that it is helping him.   ROV 10/06/14 -- hx COPD, mild AFL. Also a hx A Fib, pacemaker. He has been doing very well.  Has not needed his SABA. He notes that his breathing improved significantly since his pacemaker. He hasn't been using any of his inhaled meds, feels quite  well.   ROV 10/29/15 -- follow-up visit for COPD with associated mild obstructive lung disease, atrial fibrillation, CAD, hypertension. He's also been followed for a AAA by Dr. [name] and will likely require repair.  He has been doing fairly well, has some wheeze occasionally and uses albuterol neb once every few weeks. He still smokes about 1 pack a month.  He is not on scheduled therapy. His last AE was last Summer '16, treated with abx and pred.      Objective:   Physical Exam Filed Vitals:   10/29/15 1059  BP: 150/88  Pulse: 79  Height:  (1.854 m)  Weight: 235 lb (106.595 kg)  SpO2: 98%   Gen: Pleasant, obese, in no distress,  normal affect  ENT: No lesions,  mouth clear,  oropharynx clear, no postnasal drip  Neck: No JVD, no TMG, no carotid bruits  Lungs: No use of accessory muscles, no wheeze  Cardiovascular: RRR, heart sounds normal, no murmur or gallops, no peripheral edema  Musculoskeletal: No deformities, no cyanosis or clubbing  Neuro: alert, non focal  Skin: Warm, no lesions or rashes     Assessment & Plan:  COPD (chronic obstructive pulmonary disease) (HCC) Mild obstruction on spirometry from 2012. He only uses DuoNeb occasionally, is not significantly limited, but he does have occasional exacerbations that  required prednisone and antibiotics.. He continues to smoke and I discussed with him that he needs to stop at least 3 weeks prior to any surgery, and  Altogether. I will check spirometry today to compare with 2012 and consider treating him with Spiriva leading up to and in the weeks following his AAA repair. He is a mildly increased risk for surgery and general anesthesia but there is nothing to preclude the procedure if the benefits outweigh his risks.   Addendum: His spirometry today shows an FEV1 of 59% of predicted, significant decrease compared with 2012. I will start him on Spiriva and he likely needs this long-term. He is at moderately increased risk  for anesthesia based on these results and his clinical symptoms. I suspect that we can improve his daily symptoms and his exacerbation frequency with bronchodilators.

## 2015-10-29 NOTE — Patient Instructions (Addendum)
Spirometry today shows a decrease in lung function compared with 2012.  Start Spiriva 1 inhalation daily  Continue to have DuoNeb available to use up to every 6 hours if needed for shortness of breath Follow with Dr Delton Coombes in 6 months or sooner if you have any problems

## 2015-10-29 NOTE — Telephone Encounter (Signed)
Patient calling because prescription for Spiriva has not been called into pharmacy. Rx sent to pharmacy Patient notified Nothing further needed. Closing encounter

## 2015-11-02 ENCOUNTER — Encounter: Payer: Self-pay | Admitting: Vascular Surgery

## 2015-11-03 LAB — URINE CULTURE: Culture: >=100000

## 2015-11-04 ENCOUNTER — Telehealth (HOSPITAL_COMMUNITY): Payer: Self-pay

## 2015-11-04 NOTE — Telephone Encounter (Signed)
Post ED Visit - Positive Culture Follow-up  Culture report reviewed by antimicrobial stewardship pharmacist:  []  Enzo Bi, Pharm.D. []  Celedonio Miyamoto, Pharm.D., BCPS []  Garvin Fila, Pharm.D. []  Georgina Pillion, Pharm.D., BCPS []  [place], [address].D., BCPS, AAHIVP []  Estella Husk, Pharm.D., BCPS, AAHIVP []  Tennis Must, Pharm.D. []  Sherle Poe, [address].D. Sydnee Levans, Pharm.D.  Positive urine culture, >/= 100,000 colonies -> Pseudomonas Flourescens Treated with Levofloxacin, organism sensitive to the same and no further patient follow-up is required at this time.  Arvid Right 11/04/2015, 1:52 PM

## 2015-11-09 NOTE — Progress Notes (Signed)
HPI: FU CAD. He has a history of coronary artery disease, status post coronary artery bypassing graft, history of ischemic cardiomyopathy, PAF as well as COPD. His last catheterization in August of 2013 showed normal left main, occluded LAD, a small OM 2 with an ostial 95% lesion, 2 moderate size obtuse marginals were occluded. The right coronary was occluded. The LIMA to the LAD was patent. The saphenous vein graft to the diagonal had a 50% proximal stenosis. The saphenous vein graft to the obtuse marginal was patent. Saphenous vein graft to the right coronary artery had a mid 40% lesion. Ejection fraction was 35%. Patient subsequently had a biventricular ICD implanted in September of 2013. He also has a history of focal aortic dissection being managed medically. Note, he also has a left iliac artery aneurysm and mild cerebrovascular disease with history of carotid endarterectomy. He is followed by vascular surgery (Dr. [name]) for these issues. Had CVA January 2016. Last echocardiogram in January 2016 showed an ejection fraction of 40-45%.there was grade 1 diastolic dysfunction and moderate left atrial enlargement. Recently had his device interrogated and found to be in recurrent atrial fibrillation. There are also plans for repair of his abdominal aortic aneurysm. Since he was last seen, Patient denies chest pain, palpitations, syncope or pedal edema. He does have dyspnea on exertion.  Current Outpatient Prescriptions  Medication Sig Dispense Refill  . albuterol (PROVENTIL HFA;VENTOLIN HFA) 108 (90 BASE) MCG/ACT inhaler Inhale 2 puffs into the lungs every 4 (four) hours as needed for wheezing. 1 Inhaler 0  . amLODipine (NORVASC) 10 MG tablet TAKE 1 TABLET EVERY DAY 90 tablet 2  . dabigatran (PRADAXA) 150 MG CAPS capsule Take 1 capsule (150 mg total) by mouth 2 (two) times daily. 180 capsule 3  . docusate sodium (COLACE) 100 MG capsule Take 1 capsule (100 mg total) by mouth every 12 (twelve) hours.  60 capsule 0  . furosemide (LASIX) 40 MG tablet Take 1 tablet (40 mg total) by mouth daily. (Patient taking differently: Take 40 mg by mouth daily as needed. ) 30 tablet 12  . hydrALAZINE (APRESOLINE) 50 MG tablet Take 0.5 tablets (25 mg total) by mouth 3 (three) times daily. 135 tablet 3  . HYDROcodone-acetaminophen (NORCO/VICODIN) 5-325 MG tablet Take 1 tablet by mouth every 6 (six) hours as needed. 10 tablet 0  . ibuprofen (ADVIL,MOTRIN) 200 MG tablet Take 400 mg by mouth every 6 (six) hours as needed for mild pain or moderate pain.    [name] ipratropium-albuterol (DUONEB) 0.5-2.5 (3) MG/3ML SOLN Take 3 mLs by nebulization every 4 (four) hours as needed (for shortness of breath).    [name] levofloxacin (LEVAQUIN) 500 MG tablet Take 1 tablet (500 mg total) by mouth daily. 10 tablet 0  . lisinopril (PRINIVIL,ZESTRIL) 40 MG tablet TAKE 1 TABLET EVERY DAY 90 tablet 3  . metoprolol succinate (TOPROL-XL) 25 MG 24 hr tablet Take 1 pill daily (Patient taking differently: Take 12.5 mg by mouth daily. ) 45 tablet 3  . Multiple Vitamin (MULITIVITAMIN WITH MINERALS) TABS Take 1 tablet by mouth daily.    . niacin (NIASPAN) 500 MG CR tablet Take 1,000 mg by mouth 2 (two) times daily.     . potassium chloride SA (K-DUR,KLOR-CON) 20 MEQ tablet TAKE 2 TABLETS THREE TIMES DAILY 540 tablet 0  . pravastatin (PRAVACHOL) 80 MG tablet Take 1 tablet (80 mg total) by mouth daily. 90 tablet 2  . tiotropium (SPIRIVA HANDIHALER) 18 MCG inhalation capsule Place 1  capsule (18 mcg total) into inhaler and inhale daily. 30 capsule 2   No current facility-administered medications for this visit.     Past Medical History  Diagnosis Date  . Atrial fibrillation (HCC)     s/p prior DCCV;  Amiodarone Rx.  . CAD (coronary artery disease)     s/p CABG;  cath 10/07:  LM ok, LAD occluded, AV CFX 95%, pRCA occluded; L-LAD, S-OM2/OM3, S-PDA ok  . Cerebrovascular disease     s/p prior Left CEA;  followed by Dr. [name]  . COPD (chronic  obstructive pulmonary disease) (HCC)   . Hyperlipidemia   . Hypertension   . Mitral insufficiency     hx of. not noted on echo 01/2012  . Nephrolithiasis   . Ischemic cardiomyopathy     echo 5/09: EF 55%, mild LAE;  Myoview 11/10: inf and apical scar with mild peri-infarct ischemia, EF 44%. Echo 01/26/12 with EF 20-25% but previously known to be 30% on echoes before last one  . Iliac artery aneurysm, left (HCC)     followed by Dr. [name]  . Aortic dissection (HCC)     H/O focal aortic dissection  . LBBB (left bundle branch block)   . CHF (congestive heart failure) (HCC)   . Myocardial infarction (HCC)   . Renal insufficiency     01/2012  . Stroke (HCC)   . CVA (cerebral infarction)   . DVT (deep venous thrombosis) (HCC)   . Fall from slipping on wet surface Apr 15, 2015    Fx 3 Valma Cava    Past Surgical History  Procedure Laterality Date  . Uteroscopy    . Coronary artery bypass graft  1998  . Left cea  07/05  . Appendectomy  09/11/11  . Biventricular defibrillator implantation  08/22/12    SJM Quadra Assura BiV ICD implanted by Dr [name]  . Spine surgery  Sep 27, 2014    Lower Back L 4  and   L 5  . Bi-ventricular implantable cardioverter defibrillator N/A 08/22/2012    Procedure: BI-VENTRICULAR IMPLANTABLE CARDIOVERTER DEFIBRILLATOR  (CRT-D);  Surgeon: [name], MD;  Location: [facility] CATH LAB;  Service: Cardiovascular;  Laterality: N/A;  . Carotid endarterectomy  July  2005    left    Social History   Social History  . Marital Status: Married    Spouse Name: N/A  . Number of Children: 3  . Years of Education: 10 TH   Occupational History  . RETIRED---truck driver    Social History Main Topics  . Smoking status: Former Smoker -- 1.50 packs/day for 57 years    Types: Cigarettes    Quit date: 12/11/2004  . Smokeless tobacco: Never Used  . Alcohol Use: No     Comment: 6 yrs ago   . Drug Use: No  . Sexual Activity: No   Other Topics Concern  . Not on file    Social History Narrative   Patient is married with 3 children.   Patient is right handed.   Patient has 10 th grade education.   Patient drinks tea occasionally.    ROS: no fevers or chills, productive cough, hemoptysis, dysphasia, odynophagia, melena, hematochezia, dysuria, hematuria, rash, seizure activity, orthopnea, PND, pedal edema, claudication. Remaining systems are negative.  Physical Exam: Well-developed well-nourished in no acute distress.  Skin is warm and dry.  HEENT is normal.  Neck is supple.  Chest diminished BS throughout Cardiovascular exam is irregular Abdominal exam nontender or distended. No masses  palpated. Extremities show no edema. neuro grossly intact  ECG 10/25/15 Atrial fibrillation, left bundle branch block.

## 2015-11-11 ENCOUNTER — Encounter: Payer: Medicare Other | Admitting: Neurology

## 2015-11-15 ENCOUNTER — Ambulatory Visit (INDEPENDENT_AMBULATORY_CARE_PROVIDER_SITE_OTHER): Payer: Medicare Other

## 2015-11-15 DIAGNOSIS — I5022 Chronic systolic (congestive) heart failure: Secondary | ICD-10-CM | POA: Diagnosis not present

## 2015-11-15 DIAGNOSIS — Z95810 Presence of automatic (implantable) cardiac defibrillator: Secondary | ICD-10-CM

## 2015-11-15 NOTE — Progress Notes (Addendum)
EPIC Encounter for ICM Monitoring  Patient Name: [name] [name] is a 74 y.o. male Date: 11/15/2015 Primary Care Physican: [name], MD Primary Cardiologist: [name] Electrophysiologist: [name] Dry Weight: Unknown, does not weigh   Bi-V Pacing 92% (dropped from 97% in November)      In the past month, have you:  1. Gained more than 2 pounds in a day or more than 5 pounds in a week? Unknown, does not weigh. Encouraged him to weigh daily.    2. Had changes in your medications (with verification of current medications)? no  3. Had more shortness of breath than is usual for you? no  4. Limited your activity because of shortness of breath? no  5. Not been able to sleep because of shortness of breath? no  6. Had increased swelling in your feet or ankles? no  7. Had symptoms of dehydration (dizziness, dry mouth, increased thirst, decreased urine output) no  8. Had changes in sodium restriction? no  9. Been compliant with medication? Yes   ICM trend:   Follow-up plan: ICM clinic phone appointment 01/24/2016 and patient has office appointment with Dr [name] 12/22/2015. Corvue daily impedance below baseline 11/06/2015 to 11/13/2015 suggesting fluid retention.  He reported SOB during that time and took extra Furosemide which resolved symptom.  Impedance above baseline 11/13/2015 to 11/15/2015 suggesting dryness which correlates with the extra Furosemide dosage x 2 days.  He reported he feels fine now.    Encouraged to maintain hydration and to slightly increase fluids especially if he takes extra Furosemide.  Patient is getting clearance for repair of AAA.  Bi-V pacing decreased from 97% in November to 92% at todays transmission and alert that he is in A-fib.          Reviewed ICM transmission with Dr [name] and he requested to send message to Dr [name] since patient has appointment on 11/18/2015 for surgery clearance.  Message sent to Dr [name]       for review.    Copy of note sent  to patient's primary care physician, primary cardiologist, and device following physician.  Karie Soda, RN, CCM 11/15/2015 12:26 PM

## 2015-11-18 ENCOUNTER — Ambulatory Visit (INDEPENDENT_AMBULATORY_CARE_PROVIDER_SITE_OTHER): Payer: Medicare Other | Admitting: Cardiology

## 2015-11-18 ENCOUNTER — Encounter: Payer: Self-pay | Admitting: Cardiology

## 2015-11-18 ENCOUNTER — Encounter: Payer: Self-pay | Admitting: *Deleted

## 2015-11-18 VITALS — BP 124/70 | HR 83 | Ht 73.0 in | Wt 227.0 lb

## 2015-11-18 DIAGNOSIS — I5022 Chronic systolic (congestive) heart failure: Secondary | ICD-10-CM

## 2015-11-18 DIAGNOSIS — I4821 Permanent atrial fibrillation: Secondary | ICD-10-CM

## 2015-11-18 DIAGNOSIS — I10 Essential (primary) hypertension: Secondary | ICD-10-CM

## 2015-11-18 DIAGNOSIS — Z01810 Encounter for preprocedural cardiovascular examination: Secondary | ICD-10-CM

## 2015-11-18 DIAGNOSIS — E785 Hyperlipidemia, unspecified: Secondary | ICD-10-CM

## 2015-11-18 DIAGNOSIS — I2510 Atherosclerotic heart disease of native coronary artery without angina pectoris: Secondary | ICD-10-CM

## 2015-11-18 DIAGNOSIS — Z95810 Presence of automatic (implantable) cardiac defibrillator: Secondary | ICD-10-CM | POA: Diagnosis not present

## 2015-11-18 DIAGNOSIS — I255 Ischemic cardiomyopathy: Secondary | ICD-10-CM

## 2015-11-18 DIAGNOSIS — I6340 Cerebral infarction due to embolism of unspecified cerebral artery: Secondary | ICD-10-CM

## 2015-11-18 DIAGNOSIS — I25810 Atherosclerosis of coronary artery bypass graft(s) without angina pectoris: Secondary | ICD-10-CM | POA: Diagnosis not present

## 2015-11-18 DIAGNOSIS — I482 Chronic atrial fibrillation: Secondary | ICD-10-CM | POA: Diagnosis not present

## 2015-11-18 DIAGNOSIS — I723 Aneurysm of iliac artery: Secondary | ICD-10-CM

## 2015-11-18 NOTE — Assessment & Plan Note (Signed)
Followed by vascular surgery. 

## 2015-11-18 NOTE — Assessment & Plan Note (Signed)
Continue statin. No aspirin given needFor anticoagulation. Given need for abdominal aortic aneurysm repair we will arrange a nuclear study preoperatively for risk stratification. Given his pulmonary disease and underlying cardiac disease he will be at increased risk for the surgery. However if his nuclear study shows no ischemia or is low risk he may proceed.

## 2015-11-18 NOTE — Assessment & Plan Note (Signed)
Continue ACE inhibitor and beta blocker. 

## 2015-11-18 NOTE — Patient Instructions (Signed)
Your physician has requested that you have a lexiscan myoview. For further information please visit [email or web address] Please follow instruction sheet, as given.   STOP AMIODARONE   Your physician recommends that you schedule a follow-up appointment in: 3 MONTHS WITH DR [name]

## 2015-11-18 NOTE — Assessment & Plan Note (Signed)
Continue statin. 

## 2015-11-18 NOTE — Assessment & Plan Note (Signed)
Followed by electrophysiology. 

## 2015-11-18 NOTE — Assessment & Plan Note (Signed)
Blood pressure controlled. Continue present medications. 

## 2015-11-18 NOTE — Assessment & Plan Note (Addendum)
Patient has developed recurrent atrial fibrillation.I do not think he is symptomatic as he is not complaining of worsening dyspnea or fatigue. I therefore think that rate control and anticoagulation would be appropriate. I will continue his beta blocker and discontinue amiodarone. Continue pradaxa. At the time of his aortic aneurysm repair he will need Lovenox while he is off his pradaxa. We will need to follow his heart rate off of the amiodarone. If his heart rate increases his beta blocker will need to be increased.

## 2015-11-18 NOTE — Assessment & Plan Note (Signed)
Patient is planning to proceed with surgical repair by Dr. [name].

## 2015-11-18 NOTE — Assessment & Plan Note (Signed)
Patient is euvolemic on examination.Continue present dose of Lasix. 

## 2015-11-19 ENCOUNTER — Encounter: Payer: Self-pay | Admitting: Vascular Surgery

## 2015-11-19 ENCOUNTER — Telehealth (HOSPITAL_COMMUNITY): Payer: Self-pay

## 2015-11-19 NOTE — Telephone Encounter (Signed)
Encounter complete. 

## 2015-11-24 ENCOUNTER — Ambulatory Visit (HOSPITAL_COMMUNITY)
Admission: RE | Admit: 2015-11-24 | Discharge: 2015-11-24 | Disposition: A | Discharge status: Home / Self Care | Payer: Medicare Other | Source: Ambulatory Visit | Attending: Cardiology | Admitting: Cardiology

## 2015-11-24 ENCOUNTER — Encounter: Payer: Self-pay | Admitting: Vascular Surgery

## 2015-11-24 DIAGNOSIS — Z87891 Personal history of nicotine dependence: Secondary | ICD-10-CM | POA: Insufficient documentation

## 2015-11-24 DIAGNOSIS — Z6830 Body mass index (BMI) 30.0-30.9, adult: Secondary | ICD-10-CM | POA: Insufficient documentation

## 2015-11-24 DIAGNOSIS — I25810 Atherosclerosis of coronary artery bypass graft(s) without angina pectoris: Secondary | ICD-10-CM | POA: Diagnosis present

## 2015-11-24 DIAGNOSIS — R0609 Other forms of dyspnea: Secondary | ICD-10-CM | POA: Insufficient documentation

## 2015-11-24 DIAGNOSIS — R9439 Abnormal result of other cardiovascular function study: Secondary | ICD-10-CM | POA: Insufficient documentation

## 2015-11-24 DIAGNOSIS — I10 Essential (primary) hypertension: Secondary | ICD-10-CM | POA: Diagnosis not present

## 2015-11-24 DIAGNOSIS — E663 Overweight: Secondary | ICD-10-CM | POA: Insufficient documentation

## 2015-11-24 DIAGNOSIS — I447 Left bundle-branch block, unspecified: Secondary | ICD-10-CM | POA: Diagnosis not present

## 2015-11-24 DIAGNOSIS — Z8249 Family history of ischemic heart disease and other diseases of the circulatory system: Secondary | ICD-10-CM | POA: Diagnosis not present

## 2015-11-24 DIAGNOSIS — I517 Cardiomegaly: Secondary | ICD-10-CM | POA: Diagnosis not present

## 2015-11-24 DIAGNOSIS — I779 Disorder of arteries and arterioles, unspecified: Secondary | ICD-10-CM | POA: Diagnosis not present

## 2015-11-24 LAB — MYOCARDIAL PERFUSION IMAGING
CHL CUP NUCLEAR SRS: 10
CHL CUP NUCLEAR SSS: 13
LV dias vol: 204 mL
LV sys vol: 272 mL
Peak HR: 81 {beats}/min
Rest HR: 76 {beats}/min
SDS: 3
TID: 1.16

## 2015-11-24 MED ORDER — TECHNETIUM TC 99M SESTAMIBI GENERIC - CARDIOLITE
10.8000 | Freq: Once | INTRAVENOUS | Status: AC | PRN
  Administered 2015-11-24: 11 via INTRAVENOUS

## 2015-11-24 MED ORDER — TECHNETIUM TC 99M SESTAMIBI GENERIC - CARDIOLITE
32.8000 | Freq: Once | INTRAVENOUS | Status: AC | PRN
  Administered 2015-11-24: 32.8 via INTRAVENOUS

## 2015-11-24 MED ORDER — REGADENOSON 0.4 MG/5ML IV SOLN
0.4000 mg | Freq: Once | INTRAVENOUS | Status: AC
  Administered 2015-11-24: 0.4 mg via INTRAVENOUS

## 2015-11-25 ENCOUNTER — Ambulatory Visit (INDEPENDENT_AMBULATORY_CARE_PROVIDER_SITE_OTHER): Payer: Medicare Other | Admitting: Neurology

## 2015-11-25 ENCOUNTER — Ambulatory Visit (INDEPENDENT_AMBULATORY_CARE_PROVIDER_SITE_OTHER): Payer: Self-pay | Admitting: Neurology

## 2015-11-25 ENCOUNTER — Encounter: Payer: Self-pay | Admitting: Neurology

## 2015-11-25 DIAGNOSIS — R202 Paresthesia of skin: Secondary | ICD-10-CM | POA: Diagnosis not present

## 2015-11-25 DIAGNOSIS — G5601 Carpal tunnel syndrome, right upper limb: Secondary | ICD-10-CM

## 2015-11-25 NOTE — Progress Notes (Signed)
Please refer to EMG and nerve conduction study procedure note. 

## 2015-11-25 NOTE — Procedures (Signed)
     HISTORY:  [name] [name] is a 74 year old gentleman with a history of intermittent right hand numbness that began about 6 months ago, but he has not had any symptoms in at least 2 months. He denies any neck pain or pain down the right arm. He is being evaluated for a possible neuropathy or a cervical radiculopathy. He has not had any weakness in the right arm.  NERVE CONDUCTION STUDIES:  Nerve conduction studies were performed on both upper extremities. The distal motor latencies and motor amplitudes for the median and ulnar nerves were within normal limits. The F wave latencies and nerve conduction velocities for these nerves were also normal. The sensory latencies for the median and ulnar nerves were normal.   EMG STUDIES:  EMG study was performed on the right upper extremity:  The first dorsal interosseous muscle reveals 2 to 5 K units with slightly decreased recruitment. No fibrillations or positive waves were noted. The abductor pollicis brevis muscle reveals 2 to 4 K units with full recruitment. No fibrillations or positive waves were noted. The extensor indicis proprius muscle reveals 1 to 3 K units with full recruitment. No fibrillations or positive waves were noted. The pronator teres muscle reveals 2 to 3 K units with full recruitment. No fibrillations or positive waves were noted. The biceps muscle reveals 1 to 2 K units with full recruitment. No fibrillations or positive waves were noted. The triceps muscle reveals 2 to 4 K units with full recruitment. No fibrillations or positive waves were noted. The anterior deltoid muscle reveals 2 to 3 K units with full recruitment. No fibrillations or positive waves were noted. The cervical paraspinal muscles were tested at 2 levels. No abnormalities of insertional activity were seen at either level tested. There was good relaxation.   IMPRESSION:  Nerve conduction studies done on both upper extremities were within normal limits.  No evidence of a neuropathy is seen. EMG evaluation of the right upper extremity is relatively unremarkable, without evidence of an overlying cervical radiculopathy.  Marlan Palau MD 11/25/2015 4:27 PM  [facility] [address] [place], [place] [address]  Phone [phone] Fax [phone]

## 2015-11-26 ENCOUNTER — Telehealth: Payer: Self-pay

## 2015-11-26 NOTE — Telephone Encounter (Signed)
Rn call patient to let him know that the nerve conduction test was normal. Also it did not show any impingement pattern. Pt verbalized understanding of test. 

## 2015-11-26 NOTE — Telephone Encounter (Signed)
-----   Message from Marvel Plan, MD sent at 11/26/2015  8:13 AM EST ----- Could you please let the patient know that the nerve conduction test done in our office was normal. Did not show never impingement pattern.  Please continue current treatment. Thanks.  Marvel Plan, MD PhD Stroke Neurology 11/26/2015 8:13 AM

## 2015-11-30 ENCOUNTER — Ambulatory Visit: Payer: Medicare Other | Admitting: Vascular Surgery

## 2015-12-01 ENCOUNTER — Encounter: Payer: Self-pay | Admitting: Vascular Surgery

## 2015-12-01 ENCOUNTER — Telehealth: Payer: Self-pay | Admitting: Emergency Medicine

## 2015-12-01 MED ORDER — IPRATROPIUM-ALBUTEROL 0.5-2.5 (3) MG/3ML IN SOLN: 3.0000 mL | RESPIRATORY_TRACT | Status: DC | PRN

## 2015-12-01 NOTE — Telephone Encounter (Signed)
Spoke with pt. He needs a refill on Duoneb. This has been sent in. Nothing further was needed at this time.

## 2015-12-02 ENCOUNTER — Ambulatory Visit (INDEPENDENT_AMBULATORY_CARE_PROVIDER_SITE_OTHER): Payer: Medicare Other | Admitting: Vascular Surgery

## 2015-12-02 ENCOUNTER — Encounter: Payer: Self-pay | Admitting: Vascular Surgery

## 2015-12-02 ENCOUNTER — Telehealth: Payer: Self-pay | Admitting: Cardiology

## 2015-12-02 ENCOUNTER — Other Ambulatory Visit: Payer: Self-pay

## 2015-12-02 VITALS — BP 134/88 | HR 96 | Ht 73.0 in | Wt 218.2 lb

## 2015-12-02 DIAGNOSIS — I714 Abdominal aortic aneurysm, without rupture, unspecified: Secondary | ICD-10-CM

## 2015-12-02 DIAGNOSIS — I6340 Cerebral infarction due to embolism of unspecified cerebral artery: Secondary | ICD-10-CM | POA: Diagnosis not present

## 2015-12-02 NOTE — Telephone Encounter (Signed)
Ok for surgery; hold pradaxa 3 days prior to procedure and resume following when ok with surgery. [name] [name]

## 2015-12-02 NOTE — Telephone Encounter (Signed)
Will forward this message to stephanie.

## 2015-12-02 NOTE — Progress Notes (Signed)
[facility]  Patient name: [name] [name] MRN: [id number] DOB: 21-Jun-1941 Sex: male  REASON FOR VISIT:  Discuss aneurysm repair  HPI: [name] [name] is a 74 y.o. male  Followed for a number of years with infrarenal abdominal aortic aneurysm. I'd seen him one month ago with CT scan showing further expansion to 5.1 cm. We discussed continued observation versus elective repair. He is insistent on elective repair. He is quite anxious regarding risk for rupture. Since my last visit he saw pulmonary and cardiology. I have reviewed evaluation from both of these. He is felt to be an acceptable risk for surgery either stent graft or open. He has a well preserved ejection fraction.  Past Medical History  Diagnosis Date  . Atrial fibrillation (HCC)     s/p prior DCCV;  Amiodarone Rx.  . CAD (coronary artery disease)     s/p CABG;  cath 10/07:  LM ok, LAD occluded, AV CFX 95%, pRCA occluded; L-LAD, S-OM2/OM3, S-PDA ok  . Cerebrovascular disease     s/p prior Left CEA;  followed by Dr. [name]  . COPD (chronic obstructive pulmonary disease) (HCC)   . Hyperlipidemia   . Hypertension   . Mitral insufficiency     hx of. not noted on echo 01/2012  . Nephrolithiasis   . Ischemic cardiomyopathy     echo 5/09: EF 55%, mild LAE;  Myoview 11/10: inf and apical scar with mild peri-infarct ischemia, EF 44%. Echo 01/26/12 with EF 20-25% but previously known to be 30% on echoes before last one  . Iliac artery aneurysm, left (HCC)     followed by Dr. [name]  . Aortic dissection (HCC)     H/O focal aortic dissection  . LBBB (left bundle branch block)   . CHF (congestive heart failure) (HCC)   . Myocardial infarction (HCC)   . Renal insufficiency     01/2012  . Stroke (HCC)   . CVA (cerebral infarction)   . DVT (deep venous thrombosis) (HCC)   . Fall from slipping on wet surface Apr 15, 2015    Fx 3 Vertabrea    Family History  Problem Relation Age of Onset  . Stroke  Maternal Uncle     SOCIAL HISTORY: Social History  Substance Use Topics  . Smoking status: Former Smoker -- 1.50 packs/day for 57 years    Types: Cigarettes    Quit date: 12/11/2004  . Smokeless tobacco: Never Used  . Alcohol Use: No     Comment: 6 yrs ago     No Known Allergies  Current Outpatient Prescriptions  Medication Sig Dispense Refill  . albuterol (PROVENTIL HFA;VENTOLIN HFA) 108 (90 BASE) MCG/ACT inhaler Inhale 2 puffs into the lungs every 4 (four) hours as needed for wheezing. 1 Inhaler 0  . amLODipine (NORVASC) 10 MG tablet TAKE 1 TABLET EVERY DAY 90 tablet 2  . dabigatran (PRADAXA) 150 MG CAPS capsule Take 1 capsule (150 mg total) by mouth 2 (two) times daily. 180 capsule 3  . docusate sodium (COLACE) 100 MG capsule Take 1 capsule (100 mg total) by mouth every 12 (twelve) hours. 60 capsule 0  . furosemide (LASIX) 40 MG tablet Take 1 tablet (40 mg total) by mouth daily. (Patient taking differently: Take 40 mg by mouth daily as needed. ) 30 tablet 12  . hydrALAZINE (APRESOLINE) 50 MG tablet Take 0.5 tablets (25 mg total) by mouth 3 (three) times daily. 135 tablet 3  . HYDROcodone-acetaminophen (NORCO/VICODIN) 5-325  MG tablet Take 1 tablet by mouth every 6 (six) hours as needed. 10 tablet 0  . ibuprofen (ADVIL,MOTRIN) 200 MG tablet Take 400 mg by mouth every 6 (six) hours as needed for mild pain or moderate pain.    [name] ipratropium-albuterol (DUONEB) 0.5-2.5 (3) MG/3ML SOLN Take 3 mLs by nebulization every 4 (four) hours as needed (for shortness of breath). 360 mL 5  . levofloxacin (LEVAQUIN) 500 MG tablet Take 1 tablet (500 mg total) by mouth daily. 10 tablet 0  . lisinopril (PRINIVIL,ZESTRIL) 40 MG tablet TAKE 1 TABLET EVERY DAY 90 tablet 3  . metoprolol succinate (TOPROL-XL) 25 MG 24 hr tablet Take 1 pill daily (Patient taking differently: Take 12.5 mg by mouth daily. ) 45 tablet 3  . Multiple Vitamin (MULITIVITAMIN WITH MINERALS) TABS Take 1 tablet by mouth daily.    .  niacin (NIASPAN) 500 MG CR tablet Take 1,000 mg by mouth 2 (two) times daily.     . potassium chloride SA (K-DUR,KLOR-CON) 20 MEQ tablet TAKE 2 TABLETS THREE TIMES DAILY 540 tablet 0  . pravastatin (PRAVACHOL) 80 MG tablet Take 1 tablet (80 mg total) by mouth daily. 90 tablet 2  . tiotropium (SPIRIVA HANDIHALER) 18 MCG inhalation capsule Place 1 capsule (18 mcg total) into inhaler and inhale daily. 30 capsule 2   No current facility-administered medications for this visit.    REVIEW OF SYSTEMS:   denotes positive finding,  denotes negative finding Cardiac  Comments:  Chest pain or chest pressure:    Shortness of breath upon exertion:    Short of breath when lying flat:    Irregular heart rhythm:        Vascular    Pain in calf, thigh, or hip brought on by ambulation:    Pain in feet at night that wakes you up from your sleep:     Blood clot in your veins:    Leg swelling:         Pulmonary    Oxygen at home:    Productive cough:     Wheezing:         Neurologic    Sudden weakness in arms or legs:     Sudden numbness in arms or legs:     Sudden onset of difficulty speaking or slurred speech:    Temporary loss of vision in one eye:     Problems with dizziness:         Gastrointestinal    Blood in stool:     Vomited blood:         Genitourinary    Burning when urinating:     Blood in urine:        Psychiatric    Major depression:         Hematologic    Bleeding problems:    Problems with blood clotting too easily:        Skin    Rashes or ulcers:        Constitutional    Fever or chills:      PHYSICAL EXAM: Filed Vitals:   12/02/15 0923 12/02/15 0925  BP: 146/80 134/88  Pulse: 96   Height:  (1.854 m)   Weight: 218 lb 3.2 oz (98.975 kg)   SpO2: 96%     GENERAL: The patient is a well-nourished male, in no acute distress. The vital signs are documented above. VASCULAR:  2+ dorsalis pedis pulses bilaterally PULMONARY: There is good air exchange  bilaterally  MUSCULOSKELETAL: There are no major deformities or cyanosis. NEUROLOGIC: No focal weakness or paresthesias are detected. SKIN: There are no ulcers or rashes noted. PSYCHIATRIC: The patient has a normal affect.  DATA:   again reviewed his recent CT discussed this at length with the patient. He does have a 5.1 cm aneurysm. There is a great deal of mural thrombus just below the takeoff of the renal arteries on the posterior aspect of his aorta. The artery comes normal caliber again below this area with no thrombus and then dilates up to 5.1 cm. He does have ectasia of his iliac arteries bilaterally.  MEDICAL ISSUES:  expanding infrarenal abdominal aortic aneurysm. I vey long discussion with patient regarding options of stent graft repair versus open repair. I'm concerned regarding the posterior thrombus just below the level renal arteries. I think that he has a high likelihood of being able to seal his aorta below this area with a stent graft but explained that there would be a possibility that this would not be adequate repair. He does not want to pursue this as a possibility. He wishes to proceed with open aneurysm repair. Discussed the difference magnitude for recovery with a higher risk for postoperative complications with open repair. Did explain the much more certain long-term repair with open surgery. He wishes to proceed with this. I explained that he would have proximally 1-2 days in the intensive care unit in approximate 5 to seven-day hospitalization. Explained to take several months to return to his usual baseline stamina. He understands and wished to proceed as soon as possible. We will schedule him after the first of the year.  No Follow-up on file.   Gretta Began [facility] [place] [facility]: [phone]

## 2015-12-02 NOTE — Telephone Encounter (Signed)
Request for surgical clearance:  1. What type of surgery is being performed? AAA repair    2. When is this surgery scheduled? 1/23  3. Are there any medications that need to be held prior to surgery and how long?Yes, Pradaxa , 5 days prior(last dose is gonna be on 1/17)  4. Name of physician performing surgery? Dr. [name]    5. What is your office phone and fax number? Telephone [phone]  6.

## 2015-12-22 ENCOUNTER — Ambulatory Visit (INDEPENDENT_AMBULATORY_CARE_PROVIDER_SITE_OTHER): Payer: Medicare Other | Admitting: Internal Medicine

## 2015-12-22 ENCOUNTER — Encounter: Payer: Self-pay | Admitting: Internal Medicine

## 2015-12-22 VITALS — BP 124/82 | HR 69 | Ht 73.0 in | Wt 215.0 lb

## 2015-12-22 DIAGNOSIS — I255 Ischemic cardiomyopathy: Secondary | ICD-10-CM

## 2015-12-22 DIAGNOSIS — I5022 Chronic systolic (congestive) heart failure: Secondary | ICD-10-CM | POA: Diagnosis not present

## 2015-12-22 DIAGNOSIS — I482 Chronic atrial fibrillation: Secondary | ICD-10-CM | POA: Diagnosis not present

## 2015-12-22 DIAGNOSIS — I4821 Permanent atrial fibrillation: Secondary | ICD-10-CM

## 2015-12-22 LAB — CUP PACEART INCLINIC DEVICE CHECK
Battery Remaining Longevity: 45.6
HIGH POWER IMPEDANCE MEASURED VALUE: 74.25 Ohm
Implantable Lead Implant Date: 20130912
Implantable Lead Location: 753860
Lead Channel Impedance Value: 462.5 Ohm
Lead Channel Impedance Value: 487.5 Ohm
Lead Channel Pacing Threshold Amplitude: 0.75 V
Lead Channel Pacing Threshold Amplitude: 0.875 V
Lead Channel Pacing Threshold Pulse Width: 0.5 ms
Lead Channel Sensing Intrinsic Amplitude: 11.7 mV
Lead Channel Setting Pacing Amplitude: 1.875
Lead Channel Setting Pacing Amplitude: 2 V
Lead Channel Setting Pacing Amplitude: 2 V
Lead Channel Setting Pacing Pulse Width: 0.5 ms
Lead Channel Setting Pacing Pulse Width: 0.5 ms
MDC IDC LEAD IMPLANT DT: 20130912
MDC IDC LEAD IMPLANT DT: 20130912
MDC IDC LEAD LOCATION: 753858
MDC IDC LEAD LOCATION: 753859
MDC IDC MSMT LEADCHNL LV PACING THRESHOLD AMPLITUDE: 1 V
MDC IDC MSMT LEADCHNL LV PACING THRESHOLD PULSEWIDTH: 0.5 ms
MDC IDC MSMT LEADCHNL RA PACING THRESHOLD PULSEWIDTH: 0.5 ms
MDC IDC MSMT LEADCHNL RA SENSING INTR AMPL: 4.1 mV
MDC IDC MSMT LEADCHNL RV IMPEDANCE VALUE: 475 Ohm
MDC IDC PG SERIAL: 1075181
MDC IDC SESS DTM: 20170111140119
MDC IDC SET LEADCHNL RV SENSING SENSITIVITY: 0.5 mV
MDC IDC STAT BRADY RA PERCENT PACED: 67 %
MDC IDC STAT BRADY RV PERCENT PACED: 85 %

## 2015-12-22 NOTE — Patient Instructions (Addendum)
Medication Instructions:  Your physician recommends that you continue on your current medications as directed. Please refer to the Current Medication list given to you today.   Labwork: None ordered   Testing/Procedures: None ordered   Follow-Up: Remote monitoring is used to monitor your ICD from home. This monitoring reduces the number of office visits required to check your device to one time per year. It allows [place] to keep an eye on the functioning of your device to ensure it is working properly. You are scheduled for a device check from home on 03/22/16. You may send your transmission at any time that day. If you have a wireless device, the transmission will be sent automatically. After your physician reviews your transmission, you will receive a postcard with your next transmission date.  Your physician wants you to follow-up in: 12 months with Gypsy Balsam, NP You will receive a reminder letter in the mail two months in advance. If you don't receive a letter, please call our office to schedule the follow-up appointment.   Any Other Special Instructions Will Be Listed Below (If Applicable).     If you need a refill on your cardiac medications before your next appointment, please call your pharmacy.

## 2015-12-22 NOTE — Progress Notes (Signed)
PCP: [name], MD Primary Cardiologist:  [name] is a 75 y.o. male who presents today for routine electrophysiology followup.  Since his last visit, the patient reports doing very well.  He is scheduled for AAA surgery later this month.  He has recently converted to AF but appears asymptomatic.  LV pacing has reduced to 85% with this however.  Today, he denies symptoms of palpitations, chest pain,  lower extremity edema, dizziness, presyncope, syncope, or ICD shocks.  The patient is otherwise without complaint today.   Past Medical History  Diagnosis Date  . Atrial fibrillation (HCC)     s/p prior DCCV;  Amiodarone Rx.  . CAD (coronary artery disease)     s/p CABG;  cath 10/07:  LM ok, LAD occluded, AV CFX 95%, pRCA occluded; L-LAD, S-OM2/OM3, S-PDA ok  . Cerebrovascular disease     s/p prior Left CEA;  followed by Dr. [name]  . COPD (chronic obstructive pulmonary disease) (HCC)   . Hyperlipidemia   . Hypertension   . Mitral insufficiency     hx of. not noted on echo 01/2012  . Nephrolithiasis   . Ischemic cardiomyopathy     echo 5/09: EF 55%, mild LAE;  Myoview 11/10: inf and apical scar with mild peri-infarct ischemia, EF 44%. Echo 01/26/12 with EF 20-25% but previously known to be 30% on echoes before last one  . Iliac artery aneurysm, left (HCC)     followed by Dr. [name]  . Aortic dissection (HCC)     H/O focal aortic dissection  . LBBB (left bundle branch block)   . CHF (congestive heart failure) (HCC)   . Myocardial infarction (HCC)   . Renal insufficiency     01/2012  . Stroke (HCC)   . CVA (cerebral infarction)   . DVT (deep venous thrombosis) (HCC)   . Fall from slipping on wet surface Apr 15, 2015    Fx 3 Valma Cava   Past Surgical History  Procedure Laterality Date  . Uteroscopy    . Coronary artery bypass graft  1998  . Left cea  07/05  . Appendectomy  09/11/11  . Biventricular defibrillator implantation  08/22/12    SJM Quadra Assura BiV  ICD implanted by Dr [name]  . Spine surgery  Sep 27, 2014    Lower Back L 4  and   L 5  . Bi-ventricular implantable cardioverter defibrillator N/A 08/22/2012    Procedure: BI-VENTRICULAR IMPLANTABLE CARDIOVERTER DEFIBRILLATOR  (CRT-D);  Surgeon: [name], MD;  Location: [facility] CATH LAB;  Service: Cardiovascular;  Laterality: N/A;  . Carotid endarterectomy  July  2005    left    Current Outpatient Prescriptions  Medication Sig Dispense Refill  . albuterol (PROVENTIL HFA;VENTOLIN HFA) 108 (90 BASE) MCG/ACT inhaler Inhale 2 puffs into the lungs every 4 (four) hours as needed for wheezing. 1 Inhaler 0  . dabigatran (PRADAXA) 150 MG CAPS capsule Take 1 capsule (150 mg total) by mouth 2 (two) times daily. 180 capsule 3  . docusate sodium (COLACE) 100 MG capsule Take 1 capsule (100 mg total) by mouth every 12 (twelve) hours. 60 capsule 0  . furosemide (LASIX) 40 MG tablet Take 40 mg by mouth as needed for edema.     . hydrALAZINE (APRESOLINE) 50 MG tablet Take 0.5 tablets (25 mg total) by mouth 3 (three) times daily. 135 tablet 3  . ibuprofen (ADVIL,MOTRIN) 200 MG tablet Take 400 mg by mouth every 6 (six) hours as needed for  mild pain or moderate pain.    [name] ipratropium-albuterol (DUONEB) 0.5-2.5 (3) MG/3ML SOLN Take 3 mLs by nebulization every 4 (four) hours as needed (for shortness of breath). 360 mL 5  . lisinopril (PRINIVIL,ZESTRIL) 40 MG tablet TAKE 1 TABLET EVERY DAY 90 tablet 3  . metoprolol tartrate (LOPRESSOR) 25 MG tablet Take 12.5 mg by mouth 2 (two) times daily.    . Multiple Vitamin (MULITIVITAMIN WITH MINERALS) TABS Take 1 tablet by mouth daily.    . niacin (NIASPAN) 500 MG CR tablet Take 1,000 mg by mouth 2 (two) times daily.     . potassium chloride SA (K-DUR,KLOR-CON) 20 MEQ tablet TAKE 2 TABLETS THREE TIMES DAILY 540 tablet 0  . pravastatin (PRAVACHOL) 80 MG tablet Take 1 tablet (80 mg total) by mouth daily. 90 tablet 2  . tiotropium (SPIRIVA HANDIHALER) 18 MCG inhalation capsule  Place 1 capsule (18 mcg total) into inhaler and inhale daily. 30 capsule 2   No current facility-administered medications for this visit.    Physical Exam: Filed Vitals:   12/22/15 1157  BP: 124/82  Pulse: 69  Height:  (1.854 m)  Weight: 215 lb (97.523 kg)  SpO2: 97%    GEN- The patient is well appearing, alert and oriented x 3 today.   Head- normocephalic, atraumatic Eyes-  Sclera clear, conjunctiva pink Ears- hearing intact Oropharynx- clear Lungs- few expiratory wheezes, normal work of breathing Chest- ICD pocket is well healed Heart- Regular rate and rhythm, no murmurs, rubs or gallops, PMI not laterally displaced GI- soft, NT, ND, + BS Extremities- no clubbing, cyanosis, or edema  ICD interrogation- reviewed in detail today,  See PACEART report  Assessment and Plan:  1.  Chronic systolic dysfunction euvolemic today however now with AF, BiV pacing is only 85% Will need to follow closely for worsening CHF symptoms or worsening EF with BiV pacing <95% Stable on an appropriate medical regimen Normal ICD function See Pace Art report No changes today Followed in ICM device clinic  I have discussed SJM Fortify Assura advisary with the patient today. He understands that recommendation from SJM is to not replace the device at this time. The patient is not device dependant.  The patient has not had appropriate device therapy in the past or implanted for secondary prevention.  Vibratory alert demonstrated today.  He is actively remotely monitored and understands the importance of compliance today.    2. Afib Dr Ludwig Clarks note is reviewed and it appears that he would prefer rate control going forward.   Continue long term anticoagulation  3. HTN Stable No change required today  [name] Return to see EP NP in 1 year Follow-up with Dr [name] as scheduled Would repeat echo upon next visit to assess EF with reduced BiV pacing due to AF  [name] MD,  [facility] 12/22/2015 12:31 PM

## 2015-12-27 ENCOUNTER — Encounter (HOSPITAL_COMMUNITY): Payer: Self-pay

## 2015-12-27 ENCOUNTER — Encounter (HOSPITAL_COMMUNITY)
Admission: RE | Admit: 2015-12-27 | Discharge: 2015-12-27 | Disposition: A | Discharge status: Home / Self Care | Payer: Medicare Other | Source: Ambulatory Visit | Attending: Vascular Surgery | Admitting: Vascular Surgery

## 2015-12-27 DIAGNOSIS — Z87891 Personal history of nicotine dependence: Secondary | ICD-10-CM | POA: Diagnosis not present

## 2015-12-27 DIAGNOSIS — J449 Chronic obstructive pulmonary disease, unspecified: Secondary | ICD-10-CM | POA: Insufficient documentation

## 2015-12-27 DIAGNOSIS — I252 Old myocardial infarction: Secondary | ICD-10-CM | POA: Diagnosis not present

## 2015-12-27 DIAGNOSIS — Z01818 Encounter for other preprocedural examination: Secondary | ICD-10-CM | POA: Insufficient documentation

## 2015-12-27 DIAGNOSIS — Z0183 Encounter for blood typing: Secondary | ICD-10-CM | POA: Diagnosis not present

## 2015-12-27 DIAGNOSIS — Z8673 Personal history of transient ischemic attack (TIA), and cerebral infarction without residual deficits: Secondary | ICD-10-CM | POA: Diagnosis not present

## 2015-12-27 DIAGNOSIS — I447 Left bundle-branch block, unspecified: Secondary | ICD-10-CM | POA: Diagnosis not present

## 2015-12-27 DIAGNOSIS — Z01812 Encounter for preprocedural laboratory examination: Secondary | ICD-10-CM | POA: Diagnosis not present

## 2015-12-27 DIAGNOSIS — Z95810 Presence of automatic (implantable) cardiac defibrillator: Secondary | ICD-10-CM | POA: Insufficient documentation

## 2015-12-27 DIAGNOSIS — Z86718 Personal history of other venous thrombosis and embolism: Secondary | ICD-10-CM | POA: Diagnosis not present

## 2015-12-27 DIAGNOSIS — Z79899 Other long term (current) drug therapy: Secondary | ICD-10-CM | POA: Insufficient documentation

## 2015-12-27 DIAGNOSIS — I723 Aneurysm of iliac artery: Secondary | ICD-10-CM | POA: Diagnosis not present

## 2015-12-27 DIAGNOSIS — I10 Essential (primary) hypertension: Secondary | ICD-10-CM | POA: Diagnosis not present

## 2015-12-27 DIAGNOSIS — Z951 Presence of aortocoronary bypass graft: Secondary | ICD-10-CM | POA: Diagnosis not present

## 2015-12-27 DIAGNOSIS — I4891 Unspecified atrial fibrillation: Secondary | ICD-10-CM | POA: Diagnosis not present

## 2015-12-27 DIAGNOSIS — I2510 Atherosclerotic heart disease of native coronary artery without angina pectoris: Secondary | ICD-10-CM | POA: Diagnosis not present

## 2015-12-27 DIAGNOSIS — I255 Ischemic cardiomyopathy: Secondary | ICD-10-CM | POA: Insufficient documentation

## 2015-12-27 DIAGNOSIS — Z7902 Long term (current) use of antithrombotics/antiplatelets: Secondary | ICD-10-CM | POA: Diagnosis not present

## 2015-12-27 DIAGNOSIS — I714 Abdominal aortic aneurysm, without rupture: Secondary | ICD-10-CM | POA: Diagnosis not present

## 2015-12-27 LAB — PROTIME-INR
INR: 1.15 (ref 0.00–1.49)
Prothrombin Time: 14.9 seconds (ref 11.6–15.2)

## 2015-12-27 LAB — URINALYSIS, ROUTINE W REFLEX MICROSCOPIC
Glucose, UA: 100 mg/dL — AB
Hgb urine dipstick: NEGATIVE
KETONES UR: 15 mg/dL — AB
NITRITE: NEGATIVE
PROTEIN: 30 mg/dL — AB
Specific Gravity, Urine: 1.025 (ref 1.005–1.030)
pH: 6 (ref 5.0–8.0)

## 2015-12-27 LAB — BLOOD GAS, ARTERIAL
Acid-Base Excess: 2.2 mmol/L — ABNORMAL HIGH (ref 0.0–2.0)
BICARBONATE: 25.6 meq/L — AB (ref 20.0–24.0)
Drawn by: 206361
FIO2: 0.21
O2 Saturation: 95.7 %
PH ART: 7.474 — AB (ref 7.350–7.450)
PO2 ART: 74.7 mmHg — AB (ref 80.0–100.0)
Patient temperature: 98.6
TCO2: 26.7 mmol/L (ref 0–100)
pCO2 arterial: 35.2 mmHg (ref 35.0–45.0)

## 2015-12-27 LAB — URINE MICROSCOPIC-ADD ON

## 2015-12-27 LAB — SURGICAL PCR SCREEN
MRSA, PCR: NEGATIVE
STAPHYLOCOCCUS AUREUS: NEGATIVE

## 2015-12-27 LAB — COMPREHENSIVE METABOLIC PANEL
ALBUMIN: 3.4 g/dL — AB (ref 3.5–5.0)
ALT: 17 U/L (ref 17–63)
AST: 19 U/L (ref 15–41)
Alkaline Phosphatase: 74 U/L (ref 38–126)
Anion gap: 12 (ref 5–15)
BUN: 18 mg/dL (ref 6–20)
CHLORIDE: 105 mmol/L (ref 101–111)
CO2: 23 mmol/L (ref 22–32)
CREATININE: 1.44 mg/dL — AB (ref 0.61–1.24)
Calcium: 9 mg/dL (ref 8.9–10.3)
GFR calc Af Amer: 54 mL/min — ABNORMAL LOW (ref >60)
GFR calc non Af Amer: 47 mL/min — ABNORMAL LOW (ref >60)
GLUCOSE: 150 mg/dL — AB (ref 65–99)
Potassium: 3 mmol/L — ABNORMAL LOW (ref 3.5–5.1)
Sodium: 140 mmol/L (ref 135–145)
Total Bilirubin: 0.7 mg/dL (ref 0.3–1.2)
Total Protein: 6.5 g/dL (ref 6.5–8.1)

## 2015-12-27 LAB — CBC
HEMATOCRIT: 46.2 % (ref 39.0–52.0)
HEMOGLOBIN: 14.6 g/dL (ref 13.0–17.0)
MCH: 28.5 pg (ref 26.0–34.0)
MCHC: 31.6 g/dL (ref 30.0–36.0)
MCV: 90.2 fL (ref 78.0–100.0)
Platelets: 178 10*3/uL (ref 150–400)
RBC: 5.12 MIL/uL (ref 4.22–5.81)
RDW: 15.2 % (ref 11.5–15.5)
WBC: 8.2 10*3/uL (ref 4.0–10.5)

## 2015-12-27 LAB — APTT: APTT: 34 s (ref 24–37)

## 2015-12-27 NOTE — Progress Notes (Signed)
[name] [name] called and informed of time and date of surgery.

## 2015-12-27 NOTE — Progress Notes (Signed)
PCP is Dr. [name] Cardiologist is Dr. [name] Card cath  2013 Echo 12-25-14 Stress test 2013 Pt reports he will take Pradaxa last on Thurs the 19th

## 2015-12-27 NOTE — Pre-Procedure Instructions (Signed)
[name] [name]  12/27/2015      [facility] - [place], [place] - [address] [place] [address] Phone: [phone] Fax: [phone]  [facility] [address] [place] - [address] [facility] [place] [address] Phone: [phone] Fax: [phone]    Your procedure is scheduled on  Jan 23.  Report to [facility] Admitting at 530 A.M.  Call this number if you have problems the morning of surgery:  [phone]   Remember:  Do not eat food or drink liquids after midnight.  Take these medicines the morning of surgery with A SIP OF WATER albuterol inhaler if needed,   amiodarone (Pacerone), amlodipine (Norvasc), citalopram (celexa), Duoneb if needed, Metoprolol Tartrate (Lopressor), Spiriva- bring all inhalers with you on the day of surgery  Stop/ take Pradaxa as directed by your Dr.  Stop taking Asprin, BC's, Goody's, Herbal medications, Fish Oil, vitamins   Do not wear jewelry, make-up or nail polish.  Do not wear lotions, powders, or perfumes.  You may wear deodorant.  Do not shave 48 hours prior to surgery.  Men may shave face and neck.  Do not bring valuables to the hospital.  [facility] is not responsible for any belongings or valuables.  Contacts, dentures or bridgework may not be worn into surgery.  Leave your suitcase in the car.  After surgery it may be brought to your room.  For patients admitted to the hospital, discharge time will be determined by your treatment team.  Patients discharged the day of surgery will not be allowed to drive home.    Special instructions:  [facility] - Preparing for Surgery  Before surgery, you can play an important role.  Because skin is not sterile, your skin needs to be as free of germs as possible.  You can reduce the number of germs on you skin by washing with CHG (chlorahexidine gluconate) soap before surgery.  CHG is an antiseptic cleaner  which kills germs and bonds with the skin to continue killing germs even after washing.  Please DO NOT use if you have an allergy to CHG or antibacterial soaps.  If your skin becomes reddened/irritated stop using the CHG and inform your nurse when you arrive at Short Stay.  Do not shave (including legs and underarms) for at least 48 hours prior to the first CHG shower.  You may shave your face.  Please follow these instructions carefully:   1.  Shower with CHG Soap the night before surgery and the  morning of Surgery.  2.  If you choose to wash your hair, wash your hair first as usual with your   normal shampoo.  3.  After you shampoo, rinse your hair and body thoroughly to remove the Shampoo.  4.  Use CHG as you would any other liquid soap.  You can apply chg directly  to the skin and wash gently with scrungie or a clean washcloth.  5.  Apply the CHG Soap to your body ONLY FROM THE NECK DOWN.        Do not use on open wounds or open sores.  Avoid contact with your eyes,  ears, mouth and genitals (private parts).  Wash genitals (private parts)  with your normal soap.  6.  Wash thoroughly, paying special attention to the area where your surgery   will be performed.  7.  Thoroughly rinse your body with warm  water from the neck down.  8.  DO NOT shower/wash with your normal soap after using and rinsing off   the CHG Soap.  9.  Pat yourself dry with a clean towel.            10.  Wear clean pajamas.            11.  Place clean sheets on your bed the night of your first shower and do not  sleep with pets.  Day of Surgery  Do not apply any lotions/deoderants the morning of surgery.  Please wear clean clothes to the hospital/surgery center.  '  Please read over the following fact sheets that you were given. Pain Booklet, Coughing and Deep Breathing, Blood Transfusion Information, MRSA Information and Surgical Site Infection Prevention

## 2015-12-28 NOTE — Progress Notes (Addendum)
Anesthesia Chart Review:  Pt is 75 year old male scheduled for abdominal aortic aneurysm repair on 01/03/2016 with Dr. [name].   Cardiologist is Dr. Olga Millers.   PMH includes:  CAD (MI, s/p CABG 1998), ischemic cardiomyopathy, AICD, CHF, atrial fibrillation, LBBB, HTN, stroke (s/p L CEA 2005), renal insufficiency, DVT, L iliac artery aneurysm, COPD, mitral insufficiency. Former smoker. BMI 29.   Medications include: albuterol, amiodarone, amlodipine, pradaxa, lasix, hydralazine, duoneb, lisinopril, metoprolol, potassium, pravastatin, spiriva. Pt to stop pradaxa 3 days prior to surgery.   Preoperative labs reviewed.    Chest x-ray 09/09/15 reviewed. Mild cardiomegaly with interstitial pulmonary edema.   EKG 10/24/15: Atrial fibrillation. LBBB. Baseline wander in lead(s) II III aVF  Nuclear stress test 11/24/15:   Nuclear stress EF: 25%.  The left ventricular ejection fraction is severely decreased (<30%).  Defect 1: There is a medium defect of severe severity present in the basal inferior, mid inferior and apex location.  Findings consistent with prior myocardial infarction.  This is an intermediate risk study.  1. Intermediate risk study secondary to severe LV dysfunction 2. 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 < 50% R ICA stenosis 2. Significant soft plaque noted in L ICA s/p CEA with <40% ICA stenosis  Echo 12/25/14:  - Left ventricle: The cavity size was severely dilated. Wall thickness was increased in a pattern of mild LVH. Systolic function was mildly to moderately reduced. The estimated ejection fraction was in the range of 40% to 45%. Diffuse hypokinesis. There is akinesis of the inferior myocardium. Doppler parameters are consistent with abnormal left ventricular relaxation (grade 1 diastolic dysfunction). - Left atrium: The atrium was moderately dilated. - Impressions: Inferior akinesis with mild to moderately reduced LV  function; grade 1 diastolic dysfunction; if clinically indicated, TEE would have better sensitivity for source of embolus.  Cardiac cath 07/19/12:  - Left mainstem: LM normal.  - Left anterior descending (LAD): 100 proximal. LAD fills via LIMA. No high grade distal disease. D1 moderated sized and fills via SVG. - Left circumflex (LCx): AV groove diffuse luminal irregularities. Mid 25%. Small OM2 with ostial 95%. MOM with subtotal stenosis. There is filling of two moderate sized OMs with SVG. Both of these vessels are occluded proximally but demonstrated no high grade disease after the graft anastomosis.  - Right coronary artery (RCA): Occluded proximally. PDA is moderate sized and fills via SVG. Two moderate sized PLs backfill via this graft and are free if disease. - LIMA: Patent to the LAD. - SVG to Diag: Patent with proximal circumferential 50% proximal stenosis and mild luminal irregularities. - SVG to OM x 2: Patent with mild luminal irregularities. - SVG to RCA: Mild luminal irregularities with mid 40% stenosis. - Left ventriculography: Left ventricular systolic function is reduced, LVEF is estimated at 35%, there is no significant mitral regurgitation.   Pt has cardiac clearance for surgery from Dr. [name] in Epic telephone encounter dated 12/02/15.   Perioperative prescription for ICD form notes procedure will likely interfere with device function. Will leave chart for RN follow up to contact rep.   If no changes, I anticipate pt can proceed with surgery as scheduled.   [name] Mast, FNP-BC [facility]/Anesthesiology Phone: [phone] 12/28/2015 2:12 PM

## 2015-12-30 NOTE — Progress Notes (Signed)
Spoke with [name] [name] and he is aware of need to be here for 0730 surgery on 1/23

## 2015-12-31 ENCOUNTER — Other Ambulatory Visit: Payer: Self-pay | Admitting: *Deleted

## 2015-12-31 MED ORDER — FUROSEMIDE 20 MG PO TABS: 40.0000 mg | ORAL_TABLET | ORAL | Status: DC | PRN

## 2016-01-02 MED ORDER — DEXTROSE 5 % IV SOLN
1.5000 g | INTRAVENOUS | Status: AC
  Administered 2016-01-03: 1.5 g via INTRAVENOUS
  Filled 2016-01-02: qty 1.5

## 2016-01-02 NOTE — Anesthesia Preprocedure Evaluation (Addendum)
Anesthesia Evaluation  Patient identified by MRN, date of birth, ID band Patient awake    Reviewed: Allergy & Precautions, NPO status , Patient's Chart, lab work & pertinent test results, reviewed documented beta blocker date and time   History of Anesthesia Complications Negative for: history of anesthetic complications  Airway Mallampati: II  TM Distance: >3 FB Neck ROM: Full    Dental  (+) Dental Advisory Given   Pulmonary COPD,  COPD inhaler, former smoker (quit '06),    breath sounds clear to auscultation       Cardiovascular hypertension, Pt. on medications and Pt. on home beta blockers (-) angina+ CAD, + Past MI, + CABG, + Peripheral Vascular Disease and + DVT  + dysrhythmias + pacemaker (BiV) + Cardiac Defibrillator (has never shocked patient)  Rhythm:Irregular Rate:Normal  12/16 Stress: EF 25%, inferior defect and scar   Neuro/Psych CVA, No Residual Symptoms    GI/Hepatic negative GI ROS, Neg liver ROS,   Endo/Other  negative endocrine ROS  Renal/GU Renal InsufficiencyRenal disease (creat 1.44)     Musculoskeletal   Abdominal   Peds  Hematology negative hematology ROS (+)   Anesthesia Other Findings   Reproductive/Obstetrics                          Anesthesia Physical Anesthesia Plan  ASA: IV  Anesthesia Plan: General   Post-op Pain Management:    Induction: Intravenous  Airway Management Planned: Oral ETT  Additional Equipment: Arterial line, PA Cath and Ultrasound Guidance Line Placement  Intra-op Plan:   Post-operative Plan: Possible Post-op intubation/ventilation  Informed Consent: I have reviewed the patients History and Physical, chart, labs and discussed the procedure including the risks, benefits and alternatives for the proposed anesthesia with the patient or authorized representative who has indicated his/her understanding and acceptance.   Dental advisory  given  Plan Discussed with: CRNA and Surgeon  Anesthesia Plan Comments: (Plan routine monitors, A line, PA cath, GETA with possible post op ventilation)        Anesthesia Quick Evaluation

## 2016-01-03 ENCOUNTER — Inpatient Hospital Stay (HOSPITAL_COMMUNITY): Payer: Medicare Other

## 2016-01-03 ENCOUNTER — Inpatient Hospital Stay (HOSPITAL_COMMUNITY)
Admission: RE | Admit: 2016-01-03 | Discharge: 2016-01-07 | DRG: 269 | Disposition: A | Discharge status: Home / Self Care | Payer: Medicare Other | Source: Ambulatory Visit | Attending: Vascular Surgery | Admitting: Vascular Surgery

## 2016-01-03 ENCOUNTER — Encounter (HOSPITAL_COMMUNITY)
Admission: RE | Disposition: A | Discharge status: Home / Self Care | Payer: Self-pay | Source: Ambulatory Visit | Attending: Vascular Surgery

## 2016-01-03 ENCOUNTER — Encounter (HOSPITAL_COMMUNITY): Payer: Self-pay | Admitting: Certified Registered Nurse Anesthetist

## 2016-01-03 ENCOUNTER — Inpatient Hospital Stay (HOSPITAL_COMMUNITY): Payer: Medicare Other | Admitting: Emergency Medicine

## 2016-01-03 ENCOUNTER — Inpatient Hospital Stay (HOSPITAL_COMMUNITY): Payer: Medicare Other | Admitting: Certified Registered Nurse Anesthetist

## 2016-01-03 DIAGNOSIS — J449 Chronic obstructive pulmonary disease, unspecified: Secondary | ICD-10-CM | POA: Diagnosis present

## 2016-01-03 DIAGNOSIS — Z8673 Personal history of transient ischemic attack (TIA), and cerebral infarction without residual deficits: Secondary | ICD-10-CM | POA: Diagnosis not present

## 2016-01-03 DIAGNOSIS — Z87891 Personal history of nicotine dependence: Secondary | ICD-10-CM | POA: Diagnosis not present

## 2016-01-03 DIAGNOSIS — I2510 Atherosclerotic heart disease of native coronary artery without angina pectoris: Secondary | ICD-10-CM | POA: Diagnosis present

## 2016-01-03 DIAGNOSIS — I447 Left bundle-branch block, unspecified: Secondary | ICD-10-CM | POA: Diagnosis present

## 2016-01-03 DIAGNOSIS — Z86718 Personal history of other venous thrombosis and embolism: Secondary | ICD-10-CM

## 2016-01-03 DIAGNOSIS — E785 Hyperlipidemia, unspecified: Secondary | ICD-10-CM | POA: Diagnosis present

## 2016-01-03 DIAGNOSIS — I509 Heart failure, unspecified: Secondary | ICD-10-CM | POA: Diagnosis present

## 2016-01-03 DIAGNOSIS — I252 Old myocardial infarction: Secondary | ICD-10-CM

## 2016-01-03 DIAGNOSIS — I255 Ischemic cardiomyopathy: Secondary | ICD-10-CM | POA: Diagnosis present

## 2016-01-03 DIAGNOSIS — I4891 Unspecified atrial fibrillation: Secondary | ICD-10-CM | POA: Diagnosis present

## 2016-01-03 DIAGNOSIS — Z8679 Personal history of other diseases of the circulatory system: Secondary | ICD-10-CM

## 2016-01-03 DIAGNOSIS — Z955 Presence of coronary angioplasty implant and graft: Secondary | ICD-10-CM

## 2016-01-03 DIAGNOSIS — I723 Aneurysm of iliac artery: Secondary | ICD-10-CM | POA: Diagnosis present

## 2016-01-03 DIAGNOSIS — I714 Abdominal aortic aneurysm, without rupture, unspecified: Secondary | ICD-10-CM | POA: Diagnosis present

## 2016-01-03 DIAGNOSIS — I110 Hypertensive heart disease with heart failure: Secondary | ICD-10-CM | POA: Diagnosis present

## 2016-01-03 DIAGNOSIS — Z79899 Other long term (current) drug therapy: Secondary | ICD-10-CM | POA: Diagnosis not present

## 2016-01-03 DIAGNOSIS — Z95828 Presence of other vascular implants and grafts: Secondary | ICD-10-CM

## 2016-01-03 HISTORY — PX: ABDOMINAL AORTIC ANEURYSM REPAIR: SHX42

## 2016-01-03 HISTORY — PX: PR VEIN BYPASS GRAFT,AORTO-FEM-POP: 35551

## 2016-01-03 HISTORY — PX: ABDOMINAL AORTIC ANEURYSM REPAIR: SUR1152

## 2016-01-03 LAB — POCT I-STAT 4, (NA,K, GLUC, HGB,HCT)
Glucose, Bld: 180 mg/dL — ABNORMAL HIGH (ref 65–99)
HCT: 41 % (ref 39.0–52.0)
Hemoglobin: 13.9 g/dL (ref 13.0–17.0)
POTASSIUM: 3.6 mmol/L (ref 3.5–5.1)
Sodium: 143 mmol/L (ref 135–145)

## 2016-01-03 LAB — POCT I-STAT 7, (LYTES, BLD GAS, ICA,H+H)
ACID-BASE EXCESS: 2 mmol/L (ref 0.0–2.0)
BICARBONATE: 27.6 meq/L — AB (ref 20.0–24.0)
CALCIUM ION: 1.03 mmol/L — AB (ref 1.13–1.30)
HEMATOCRIT: 36 % — AB (ref 39.0–52.0)
Hemoglobin: 12.2 g/dL — ABNORMAL LOW (ref 13.0–17.0)
O2 Saturation: 100 %
PH ART: 7.404 (ref 7.350–7.450)
POTASSIUM: 3.3 mmol/L — AB (ref 3.5–5.1)
SODIUM: 142 mmol/L (ref 135–145)
TCO2: 29 mmol/L (ref 0–100)
pCO2 arterial: 44.1 mmHg (ref 35.0–45.0)
pO2, Arterial: 292 mmHg — ABNORMAL HIGH (ref 80.0–100.0)

## 2016-01-03 LAB — CBC
HEMATOCRIT: 44.2 % (ref 39.0–52.0)
HEMOGLOBIN: 14.2 g/dL (ref 13.0–17.0)
MCH: 29.1 pg (ref 26.0–34.0)
MCHC: 32.1 g/dL (ref 30.0–36.0)
MCV: 90.6 fL (ref 78.0–100.0)
Platelets: 184 10*3/uL (ref 150–400)
RBC: 4.88 MIL/uL (ref 4.22–5.81)
RDW: 15.7 % — AB (ref 11.5–15.5)
WBC: 22.5 10*3/uL — ABNORMAL HIGH (ref 4.0–10.5)

## 2016-01-03 LAB — PROTIME-INR
INR: 1.26 (ref 0.00–1.49)
Prothrombin Time: 15.9 seconds — ABNORMAL HIGH (ref 11.6–15.2)

## 2016-01-03 LAB — APTT
aPTT: 30 seconds (ref 24–37)
aPTT: 34 seconds (ref 24–37)

## 2016-01-03 LAB — BASIC METABOLIC PANEL
Anion gap: 12 (ref 5–15)
BUN: 24 mg/dL — ABNORMAL HIGH (ref 6–20)
CALCIUM: 7.7 mg/dL — AB (ref 8.9–10.3)
CHLORIDE: 108 mmol/L (ref 101–111)
CO2: 25 mmol/L (ref 22–32)
CREATININE: 1.42 mg/dL — AB (ref 0.61–1.24)
GFR calc non Af Amer: 47 mL/min — ABNORMAL LOW (ref >60)
GFR, EST AFRICAN AMERICAN: 55 mL/min — AB (ref >60)
GLUCOSE: 187 mg/dL — AB (ref 65–99)
Potassium: 4 mmol/L (ref 3.5–5.1)
Sodium: 145 mmol/L (ref 135–145)

## 2016-01-03 LAB — MAGNESIUM: Magnesium: 2.1 mg/dL (ref 1.7–2.4)

## 2016-01-03 LAB — PREPARE RBC (CROSSMATCH)

## 2016-01-03 SURGERY — ANEURYSM ABDOMINAL AORTIC REPAIR
Anesthesia: General | Site: Abdomen

## 2016-01-03 MED ORDER — GUAIFENESIN-DM 100-10 MG/5ML PO SYRP: 15.0000 mL | ORAL_SOLUTION | ORAL | Status: DC | PRN

## 2016-01-03 MED ORDER — KCL IN DEXTROSE-NACL 20-5-0.45 MEQ/L-%-% IV SOLN
INTRAVENOUS | Status: DC
  Administered 2016-01-03 – 2016-01-05 (×3): via INTRAVENOUS
  Filled 2016-01-03 (×8): qty 1000

## 2016-01-03 MED ORDER — LIDOCAINE HCL (CARDIAC) 20 MG/ML IV SOLN
INTRAVENOUS | Status: AC
  Filled 2016-01-03: qty 5

## 2016-01-03 MED ORDER — HEPARIN SODIUM (PORCINE) 1000 UNIT/ML IJ SOLN
INTRAMUSCULAR | Status: AC
  Filled 2016-01-03: qty 1

## 2016-01-03 MED ORDER — SUCCINYLCHOLINE CHLORIDE 20 MG/ML IJ SOLN
INTRAMUSCULAR | Status: AC
  Filled 2016-01-03: qty 1

## 2016-01-03 MED ORDER — MEPERIDINE HCL 25 MG/ML IJ SOLN: 6.2500 mg | INTRAMUSCULAR | Status: DC | PRN

## 2016-01-03 MED ORDER — FENTANYL CITRATE (PF) 250 MCG/5ML IJ SOLN
INTRAMUSCULAR | Status: AC
  Filled 2016-01-03: qty 5

## 2016-01-03 MED ORDER — PHENYLEPHRINE HCL 10 MG/ML IJ SOLN
10.0000 mg | INTRAVENOUS | Status: DC | PRN
  Administered 2016-01-03: 10 ug/min via INTRAVENOUS

## 2016-01-03 MED ORDER — LACTATED RINGERS IV SOLN
INTRAVENOUS | Status: DC | PRN
  Administered 2016-01-03: 08:00:00 via INTRAVENOUS

## 2016-01-03 MED ORDER — PANTOPRAZOLE SODIUM 40 MG PO TBEC
40.0000 mg | DELAYED_RELEASE_TABLET | Freq: Every day | ORAL | Status: DC
  Administered 2016-01-05 – 2016-01-07 (×3): 40 mg via ORAL
  Filled 2016-01-03 (×4): qty 1

## 2016-01-03 MED ORDER — DOCUSATE SODIUM 100 MG PO CAPS
100.0000 mg | ORAL_CAPSULE | Freq: Every day | ORAL | Status: DC
  Administered 2016-01-05: 100 mg via ORAL
  Filled 2016-01-03 (×3): qty 1

## 2016-01-03 MED ORDER — NEOSTIGMINE METHYLSULFATE 10 MG/10ML IV SOLN
INTRAVENOUS | Status: DC | PRN
  Administered 2016-01-03: 5 mg via INTRAVENOUS

## 2016-01-03 MED ORDER — MIDAZOLAM HCL 2 MG/2ML IJ SOLN
INTRAMUSCULAR | Status: AC
  Filled 2016-01-03: qty 2

## 2016-01-03 MED ORDER — MORPHINE SULFATE (PF) 2 MG/ML IV SOLN
2.0000 mg | INTRAVENOUS | Status: DC | PRN
  Administered 2016-01-03 – 2016-01-05 (×10): 2 mg via INTRAVENOUS
  Filled 2016-01-03 (×10): qty 1

## 2016-01-03 MED ORDER — MAGNESIUM SULFATE 2 GM/50ML IV SOLN: 2.0000 g | Freq: Once | INTRAVENOUS | Status: DC | PRN

## 2016-01-03 MED ORDER — VECURONIUM BROMIDE 10 MG IV SOLR
INTRAVENOUS | Status: DC | PRN
  Administered 2016-01-03: 5 mg via INTRAVENOUS
  Administered 2016-01-03: 2 mg via INTRAVENOUS
  Administered 2016-01-03 (×2): 1 mg via INTRAVENOUS
  Administered 2016-01-03: 2 mg via INTRAVENOUS

## 2016-01-03 MED ORDER — GLYCOPYRROLATE 0.2 MG/ML IJ SOLN
INTRAMUSCULAR | Status: DC | PRN
  Administered 2016-01-03: .8 mg via INTRAVENOUS

## 2016-01-03 MED ORDER — PROMETHAZINE HCL 25 MG/ML IJ SOLN: 6.2500 mg | INTRAMUSCULAR | Status: DC | PRN

## 2016-01-03 MED ORDER — MIDAZOLAM HCL 5 MG/5ML IJ SOLN
INTRAMUSCULAR | Status: DC | PRN
  Administered 2016-01-03: 1 mg via INTRAVENOUS

## 2016-01-03 MED ORDER — LABETALOL HCL 5 MG/ML IV SOLN
10.0000 mg | INTRAVENOUS | Status: DC | PRN
Start: 2016-01-03 — End: 2016-01-07
  Administered 2016-01-04 – 2016-01-05 (×2): 10 mg via INTRAVENOUS
  Filled 2016-01-03 (×2): qty 4

## 2016-01-03 MED ORDER — HEPARIN SODIUM (PORCINE) 1000 UNIT/ML IJ SOLN
INTRAMUSCULAR | Status: DC | PRN
  Administered 2016-01-03: 3000 [IU] via INTRAVENOUS
  Administered 2016-01-03: 9000 [IU] via INTRAVENOUS

## 2016-01-03 MED ORDER — ACETAMINOPHEN 650 MG RE SUPP: 325.0000 mg | RECTAL | Status: DC | PRN

## 2016-01-03 MED ORDER — FENTANYL CITRATE (PF) 100 MCG/2ML IJ SOLN
INTRAMUSCULAR | Status: DC | PRN
Start: 2016-01-03 — End: 2016-01-03
  Administered 2016-01-03: 50 ug via INTRAVENOUS
  Administered 2016-01-03: 450 ug via INTRAVENOUS
  Administered 2016-01-03 (×2): 100 ug via INTRAVENOUS
  Administered 2016-01-03: 50 ug via INTRAVENOUS

## 2016-01-03 MED ORDER — BISACODYL 10 MG RE SUPP: 10.0000 mg | Freq: Every day | RECTAL | Status: DC | PRN

## 2016-01-03 MED ORDER — NITROGLYCERIN IN D5W 200-5 MCG/ML-% IV SOLN
INTRAVENOUS | Status: DC | PRN
  Administered 2016-01-03: 3 ug/min via INTRAVENOUS

## 2016-01-03 MED ORDER — PHENYLEPHRINE 40 MCG/ML (10ML) SYRINGE FOR IV PUSH (FOR BLOOD PRESSURE SUPPORT)
PREFILLED_SYRINGE | INTRAVENOUS | Status: AC
  Filled 2016-01-03: qty 10

## 2016-01-03 MED ORDER — ONDANSETRON HCL 4 MG/2ML IJ SOLN
INTRAMUSCULAR | Status: AC
  Filled 2016-01-03: qty 2

## 2016-01-03 MED ORDER — SODIUM CHLORIDE 0.9 % IV SOLN
INTRAVENOUS | Status: DC | PRN
  Administered 2016-01-03: 09:00:00

## 2016-01-03 MED ORDER — EPHEDRINE SULFATE 50 MG/ML IJ SOLN
INTRAMUSCULAR | Status: AC
  Filled 2016-01-03: qty 1

## 2016-01-03 MED ORDER — ALUM & MAG HYDROXIDE-SIMETH 200-200-20 MG/5ML PO SUSP: 15.0000 mL | ORAL | Status: DC | PRN

## 2016-01-03 MED ORDER — PANTOPRAZOLE SODIUM 40 MG IV SOLR
40.0000 mg | Freq: Every day | INTRAVENOUS | Status: DC
  Administered 2016-01-03 – 2016-01-04 (×2): 40 mg via INTRAVENOUS
  Filled 2016-01-03 (×2): qty 40

## 2016-01-03 MED ORDER — 0.9 % SODIUM CHLORIDE (POUR BTL) OPTIME
TOPICAL | Status: DC | PRN
  Administered 2016-01-03: 3000 mL

## 2016-01-03 MED ORDER — TIOTROPIUM BROMIDE MONOHYDRATE 18 MCG IN CAPS
18.0000 ug | ORAL_CAPSULE | Freq: Every day | RESPIRATORY_TRACT | Status: DC
  Administered 2016-01-04 – 2016-01-06 (×3): 18 ug via RESPIRATORY_TRACT
  Filled 2016-01-03: qty 5

## 2016-01-03 MED ORDER — METOPROLOL TARTRATE 12.5 MG HALF TABLET
ORAL_TABLET | ORAL | Status: AC
  Filled 2016-01-03: qty 2

## 2016-01-03 MED ORDER — SODIUM CHLORIDE 0.9 % IV SOLN: Freq: Once | INTRAVENOUS | Status: DC

## 2016-01-03 MED ORDER — DEXAMETHASONE SODIUM PHOSPHATE 10 MG/ML IJ SOLN
INTRAMUSCULAR | Status: AC
  Filled 2016-01-03: qty 1

## 2016-01-03 MED ORDER — METOPROLOL TARTRATE 25 MG PO TABS
25.0000 mg | ORAL_TABLET | Freq: Once | ORAL | Status: AC
  Administered 2016-01-03: 25 mg via ORAL
  Filled 2016-01-03: qty 1

## 2016-01-03 MED ORDER — MANNITOL 25 % IV SOLN
25.0000 g | Freq: Once | INTRAVENOUS | Status: DC
  Filled 2016-01-03: qty 100

## 2016-01-03 MED ORDER — ALBUTEROL SULFATE (2.5 MG/3ML) 0.083% IN NEBU: 2.5000 mg | INHALATION_SOLUTION | RESPIRATORY_TRACT | Status: DC | PRN

## 2016-01-03 MED ORDER — MANNITOL 25 % IV SOLN
INTRAVENOUS | Status: DC | PRN
  Administered 2016-01-03: 25 g via INTRAVENOUS

## 2016-01-03 MED ORDER — CHLORHEXIDINE GLUCONATE CLOTH 2 % EX PADS: 6.0000 | MEDICATED_PAD | Freq: Once | CUTANEOUS | Status: DC

## 2016-01-03 MED ORDER — SODIUM CHLORIDE 0.9 % IV SOLN
INTRAVENOUS | Status: DC
  Administered 2016-01-03 (×2): via INTRAVENOUS

## 2016-01-03 MED ORDER — SODIUM CHLORIDE 0.9 % IV SOLN: 500.0000 mL | Freq: Once | INTRAVENOUS | Status: DC | PRN

## 2016-01-03 MED ORDER — PHENOL 1.4 % MT LIQD: 1.0000 | OROMUCOSAL | Status: DC | PRN

## 2016-01-03 MED ORDER — ONDANSETRON HCL 4 MG/2ML IJ SOLN
4.0000 mg | Freq: Four times a day (QID) | INTRAMUSCULAR | Status: DC | PRN
  Administered 2016-01-05: 4 mg via INTRAVENOUS
  Filled 2016-01-03: qty 2

## 2016-01-03 MED ORDER — ROCURONIUM BROMIDE 50 MG/5ML IV SOLN
INTRAVENOUS | Status: AC
  Filled 2016-01-03: qty 1

## 2016-01-03 MED ORDER — POTASSIUM CHLORIDE CRYS ER 20 MEQ PO TBCR: 20.0000 meq | EXTENDED_RELEASE_TABLET | Freq: Once | ORAL | Status: DC | PRN

## 2016-01-03 MED ORDER — IPRATROPIUM-ALBUTEROL 0.5-2.5 (3) MG/3ML IN SOLN
3.0000 mL | RESPIRATORY_TRACT | Status: DC | PRN
  Administered 2016-01-04 – 2016-01-06 (×4): 3 mL via RESPIRATORY_TRACT
  Filled 2016-01-03 (×3): qty 3
  Filled 2016-01-03: qty 30

## 2016-01-03 MED ORDER — ALBUMIN HUMAN 5 % IV SOLN
INTRAVENOUS | Status: DC | PRN
  Administered 2016-01-03 (×2): via INTRAVENOUS

## 2016-01-03 MED ORDER — ARTIFICIAL TEARS OP OINT
TOPICAL_OINTMENT | OPHTHALMIC | Status: DC | PRN
  Administered 2016-01-03: 1 via OPHTHALMIC

## 2016-01-03 MED ORDER — ACETAMINOPHEN 325 MG PO TABS
325.0000 mg | ORAL_TABLET | ORAL | Status: DC | PRN
Start: 2016-01-03 — End: 2016-01-07

## 2016-01-03 MED ORDER — ROCURONIUM BROMIDE 100 MG/10ML IV SOLN
INTRAVENOUS | Status: DC | PRN
  Administered 2016-01-03: 50 mg via INTRAVENOUS

## 2016-01-03 MED ORDER — DEXAMETHASONE SODIUM PHOSPHATE 10 MG/ML IJ SOLN
INTRAMUSCULAR | Status: DC | PRN
  Administered 2016-01-03: 10 mg via INTRAVENOUS

## 2016-01-03 MED ORDER — METOPROLOL TARTRATE 1 MG/ML IV SOLN: 2.0000 mg | INTRAVENOUS | Status: DC | PRN

## 2016-01-03 MED ORDER — DEXTROSE 5 % IV SOLN
1.5000 g | Freq: Two times a day (BID) | INTRAVENOUS | Status: AC
  Administered 2016-01-03 – 2016-01-04 (×2): 1.5 g via INTRAVENOUS
  Filled 2016-01-03 (×2): qty 1.5

## 2016-01-03 MED ORDER — HYDROMORPHONE HCL 1 MG/ML IJ SOLN
INTRAMUSCULAR | Status: AC
  Filled 2016-01-03: qty 1

## 2016-01-03 MED ORDER — PROPOFOL 10 MG/ML IV BOLUS
INTRAVENOUS | Status: DC | PRN
  Administered 2016-01-03: 60 mg via INTRAVENOUS

## 2016-01-03 MED ORDER — ONDANSETRON HCL 4 MG/2ML IJ SOLN
INTRAMUSCULAR | Status: DC | PRN
  Administered 2016-01-03: 4 mg via INTRAVENOUS

## 2016-01-03 MED ORDER — MIDAZOLAM HCL 2 MG/2ML IJ SOLN: 0.5000 mg | Freq: Once | INTRAMUSCULAR | Status: DC | PRN

## 2016-01-03 MED ORDER — PROTAMINE SULFATE 10 MG/ML IV SOLN
INTRAVENOUS | Status: DC | PRN
  Administered 2016-01-03: 50 mg via INTRAVENOUS

## 2016-01-03 MED ORDER — HYDRALAZINE HCL 20 MG/ML IJ SOLN: 5.0000 mg | INTRAMUSCULAR | Status: DC | PRN

## 2016-01-03 MED ORDER — VECURONIUM BROMIDE 10 MG IV SOLR
INTRAVENOUS | Status: AC
  Filled 2016-01-03: qty 20

## 2016-01-03 MED ORDER — METOPROLOL TARTRATE 1 MG/ML IV SOLN
2.5000 mg | Freq: Four times a day (QID) | INTRAVENOUS | Status: DC
  Administered 2016-01-03 – 2016-01-05 (×7): 2.5 mg via INTRAVENOUS
  Filled 2016-01-03 (×7): qty 5

## 2016-01-03 MED ORDER — HYDROMORPHONE HCL 1 MG/ML IJ SOLN: 0.2500 mg | INTRAMUSCULAR | Status: DC | PRN

## 2016-01-03 MED ORDER — PROPOFOL 10 MG/ML IV BOLUS
INTRAVENOUS | Status: AC
  Filled 2016-01-03: qty 20

## 2016-01-03 SURGICAL SUPPLY — 53 items
APL SKNCLS STERI-STRIP NONHPOA (GAUZE/BANDAGES/DRESSINGS) ×1
BENZOIN TINCTURE PRP APPL 2/3 (GAUZE/BANDAGES/DRESSINGS) ×3 IMPLANT
CANISTER SUCTION 2500CC (MISCELLANEOUS) ×3 IMPLANT
CANNULA VESSEL 3MM 2 BLNT TIP (CANNULA) ×3 IMPLANT
CLIP LIGATING EXTRA MED SLVR (CLIP) ×3 IMPLANT
CLIP LIGATING EXTRA SM BLUE (MISCELLANEOUS) ×3 IMPLANT
CLOSURE WOUND 1/2 X4 (GAUZE/BANDAGES/DRESSINGS) ×1
DRSG COVADERM 4X14 (GAUZE/BANDAGES/DRESSINGS) ×2 IMPLANT
DRSG COVADERM 4X6 (GAUZE/BANDAGES/DRESSINGS) ×2 IMPLANT
ELECT BLADE 4.0 EZ CLEAN MEGAD (MISCELLANEOUS) ×3
ELECT REM PT RETURN 9FT ADLT (ELECTROSURGICAL) ×3
ELECTRODE BLDE 4.0 EZ CLN MEGD (MISCELLANEOUS) IMPLANT
ELECTRODE REM PT RTRN 9FT ADLT (ELECTROSURGICAL) ×1 IMPLANT
FELT TEFLON 4 X1 (Mesh General) ×3 IMPLANT
GLOVE BIO SURGEON STRL SZ 6.5 (GLOVE) ×7 IMPLANT
GLOVE BIO SURGEON STRL SZ7.5 (GLOVE) ×2 IMPLANT
GLOVE BIO SURGEONS STRL SZ 6.5 (GLOVE) ×7
GLOVE BIOGEL PI IND STRL 6.5 (GLOVE) IMPLANT
GLOVE BIOGEL PI IND STRL 7.0 (GLOVE) IMPLANT
GLOVE BIOGEL PI INDICATOR 6.5 (GLOVE) ×4
GLOVE BIOGEL PI INDICATOR 7.0 (GLOVE) ×10
GLOVE ECLIPSE 6.5 STRL STRAW (GLOVE) ×9 IMPLANT
GLOVE SS BIOGEL STRL SZ 7.5 (GLOVE) ×1 IMPLANT
GLOVE SUPERSENSE BIOGEL SZ 7.5 (GLOVE) ×2
GLOVE SURG SS PI 6.5 STRL IVOR (GLOVE) ×4 IMPLANT
GOWN STRL REUS W/ TWL LRG LVL3 (GOWN DISPOSABLE) ×3 IMPLANT
GOWN STRL REUS W/TWL LRG LVL3 (GOWN DISPOSABLE) ×27
GOWN STRL REUS W/TWL XL LVL3 (GOWN DISPOSABLE) ×2 IMPLANT
GRAFT HEMASHIELD 22X11M (Vascular Products) ×2 IMPLANT
INSERT FOGARTY 61MM (MISCELLANEOUS) ×6 IMPLANT
INSERT FOGARTY SM (MISCELLANEOUS) ×12 IMPLANT
KIT BASIN OR (CUSTOM PROCEDURE TRAY) ×3 IMPLANT
KIT ROOM TURNOVER OR (KITS) ×3 IMPLANT
LIGACLIP MED TITANIUM (CLIP) ×2 IMPLANT
NS IRRIG 1000ML POUR BTL (IV SOLUTION) ×8 IMPLANT
PACK AORTA (CUSTOM PROCEDURE TRAY) ×3 IMPLANT
PAD ARMBOARD 7.5X6 YLW CONV (MISCELLANEOUS) ×6 IMPLANT
SPONGE LAP 18X18 X RAY DECT (DISPOSABLE) IMPLANT
STAPLER VISISTAT 35W (STAPLE) ×2 IMPLANT
STRIP CLOSURE SKIN 1/2X4 (GAUZE/BANDAGES/DRESSINGS) ×1 IMPLANT
SUT ETHIBOND 5 LR DA (SUTURE) IMPLANT
SUT PDS AB 1 TP1 54 (SUTURE) ×6 IMPLANT
SUT PROLENE 3 0 SH1 36 (SUTURE) ×12 IMPLANT
SUT PROLENE 5 0 C 1 24 (SUTURE) ×2 IMPLANT
SUT PROLENE 5 0 C 1 36 (SUTURE) ×21 IMPLANT
SUT SILK 2 0 SH CR/8 (SUTURE) ×3 IMPLANT
SUT VIC AB 2-0 CT1 36 (SUTURE) ×6 IMPLANT
SUT VIC AB 3-0 SH 27 (SUTURE) ×6
SUT VIC AB 3-0 SH 27X BRD (SUTURE) ×2 IMPLANT
SUT VICRYL 4-0 PS2 18IN ABS (SUTURE) ×2 IMPLANT
TOWEL BLUE STERILE X RAY DET (MISCELLANEOUS) ×6 IMPLANT
TRAY FOLEY W/METER SILVER 16FR (SET/KITS/TRAYS/PACK) ×3 IMPLANT
WATER STERILE IRR 1000ML POUR (IV SOLUTION) ×6 IMPLANT

## 2016-01-03 NOTE — Progress Notes (Addendum)
  Day of Surgery Note    Subjective:  Comfortable in PACU  Filed Vitals:   01/03/16 1330 01/03/16 1345  BP:    Pulse: 71 75  Temp:    Resp: 22 20    Incisions:   Midline bandage is clean and dry Extremities:  Some color changes of the left 4th/5th toes (minor on the 3rd toe) Cardiac:  regular Lungs:  Non labored Abdomen:  Soft, NT/ND  Assessment/Plan:  This is a 75 y.o. male who is s/p aortobi-iliac bypass grafting   -pt doing well in PACU -color changes to left 3rd, 4th, and 5th toes, which was present after drapes removed in OR.  Will continue to monitor.  Dr. [name] also evaluated toes. -to 2 south when bed available -pt was on Pradaxa pre-operatively-possibly restart tomorrow depending on H&H   Doreatha Massed, PA-C 01/03/2016 2:12 PM  I have examined the patient, reviewed and agree with above. Hemodynamically stable and comfortable extubated. Patient had this bruising appearance on the toes in the operating room. I did not visualize his feet preoperatively. Does appear to be more bruising. Does have excellent flow to his feet bilaterally. We'll continue to watch.  Gretta Began, MD 01/03/2016 2:46 PM

## 2016-01-03 NOTE — Op Note (Signed)
OPERATIVE REPORT  DATE OF SURGERY: 01/03/2016  PATIENT: [name] [name], 75 y.o. 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  PHYSICIAN ASSISTANT: Dr. Leonette Most fields, Doreatha Massed PA-C  ANESTHESIA:  Gen.    Total I/O In: 4100 [I.V.:2500; Blood:1080; NG/GT:20; IV Piggyback:500] Out: 2600 [Urine:700; Emesis/NG output:100; Blood:1800]  BLOOD ADMINISTERED: cell saver  DRAINS: None  SPECIMEN: None  COUNTS CORRECT:  YES  PLAN OF CARE: PACU extubated   PATIENT DISPOSITION:  PACU - hemodynamically stable  PROCEDURE DETAILS: Patient was taken to the operative placed supine position where the area of the abdomen both groins prepped and draped in usual sterile fashion. A incision was made from the level of the xiphoid to below the umbilicus and carried down to the midline fascia with left cautery. Patient has a very little subcutaneous fat. The patient did have stat diastases rectus. The midline fascia was opened with electrocautery and line with the skin incision. The patient had a prior abdominal hernia repair without mesh. The fascia was opened through this prior repair Trac retractor was used for exposure. Transverse colon omentum reflected superiorly and the small bowel was reflected to the right. The duodenum was mobilized off the aorta. The aorta was encircled at the level of the renal arteries. There was an area that had been none on the prior CT of the neural thrombus on the posterior wall of the aorta at the level the renal arteries. The artery was exposed above this. Sections extended down onto the aortic bifurcation. The patient had a very calcified large inferior mesenteric artery. This was ligated at its  origin. At the completion after bypass of this continued to have good backbleeding and was ligated at patient the case with 2-0 silk suture. This exited on the bifurcation. The external iliac artery was normal caliber with calcified on the right. It was ectatic and aneurysmal on the left. The artery was exposed below the area of aneurysm on the left. Patient was given 25 g of mannitol 9000 units intravenous heparin. The aorta was occluded with a Hartmann clamp just below the level renal arteries. The common iliac arteries were occluded with him the clamps bilaterally. The aorta was opened with electrocautery and extended the with scissors. There were several large lumbar backbleeding vessels and these were controlled with 2-0 silk figure-of-eight sutures. The aorta was transected below the level the proximal clamp. Mural thrombus at this level the posterior wall was removed. The common iliac artery was transected on the right. On the left as well as area adherent to the vein and the posterior wall was left intact. A 22 x 11 Hemashield graft was brought onto the field. This cut to appropriate length using a felt strip reinforcement the graft was sewn into into the aorta with a running 30 proline suture. This anastomosis was tested and several additional pledgeted sutures were required on the back wall of the anastomosis. This was then the tested and found to be adequate. Fogarty occlusion clamps were placed on the right and left limbs of the graft. The left limb of the graft was then sewn to the common iliac artery distally with a running 50 proline suture. Several additional sutures were required for hemostasis on the back wall. The  usual flushing maneuvers this anastomosis was completed as well. There appear to be less flow in the external iliac artery and should be. The artery was extremely calcified did seem to have somewhat diminished pulse. For this reason I decided to redo the left iliac anastomosis. Limb was  reoccluded the artery was exposed further distally at the iliac bifurcation was reoccluded. The anastomosis was taken down and the artery was mobilized from the vein. A 50 proline suture with an end-to-end in fashion was used to reclose this anastomosis. The clamps removed after the usual flushing maneuvers and there was better flow in the level of the external iliac was extremity calcified. The right limb of the graft was then cut to appropriate length and was sewn into into the right common iliac artery with the running 50 proline suture. This artery was of normal caliber but was extremely calcified. Again after the usual flushing maneuvers is status post completed and flow was restored to the right leg. Patient had somewhat diminished femoral pulses bilaterally although the external iliac arteries were small and heavily calcified. There was excellent Doppler flow in the external iliac arteries bilaterally. The patient was given 50 mg protamine to reverse heparin. Wounds irrigated with saline. Hemostasis tablet cautery. The aortic wall was closed over the graft with a running 20 but suture. The retroperitoneum was closed running 20 vital suture. The small bowel was run in its entirety and found to be without injury was placed back in the pelvis. The transverse colon omentum replaced over this. The midline fascia was closed with a #1 PDS suture beginning proximally and distally and tying in the middle. Skin was closed with 3-0 subcuticular stitch. Sterile dressing was applied. The patient has biphasic posterior tibial signals bilaterally in both feet. The patient was transferred to the recovery room extubated in stable condition   Gretta Began, M.D. 01/03/2016 2:37 PM

## 2016-01-03 NOTE — Anesthesia Procedure Notes (Addendum)
Central Venous Catheter Insertion Performed by: anesthesiologist 01/03/2016 7:17 AM Patient location: Pre-op. Preanesthetic checklist: patient identified, IV checked, site marked, risks and benefits discussed, surgical consent, monitors and equipment checked, pre-op evaluation and timeout performed Position: supine Lidocaine 1% used for infiltration Landmarks identified and Seldinger technique used Catheter size: 8 Fr PA cath was placed.Swan type and PA catheter depth:thermodilationProcedure performed using ultrasound guided technique. Attempts: 1 Following insertion, line sutured, dressing applied and Biopatch. Post procedure assessment: blood return through all ports, free fluid flow and no air. Patient tolerated the procedure well with no immediate complications. Additional procedure comments: PA catheter:  Routine monitors. Timeout, sterile prep, drape, FBP R neck.  Trendelenburg position.  1% Lido local, finder and trocar RIJ 1st pass with US guidance.  Cordis placed over J wire. PA catheter in easily.  Sterile dressing applied.  Patient tolerated well, VSS.  Jenita Seashore, MD.    Procedure Name: Intubation Date/Time: 01/03/2016 8:00 AM Performed by: Inda Coke Pre-anesthesia Checklist: Patient identified, Timeout performed, Emergency Drugs available, Suction available and Patient being monitored Patient Re-evaluated:Patient Re-evaluated prior to inductionOxygen Delivery Method: Circle system utilized Preoxygenation: Pre-oxygenation with 100% oxygen Intubation Type: IV induction Ventilation: Mask ventilation without difficulty and Oral airway inserted - appropriate to patient size Laryngoscope Size: Mac and 4 Grade View: Grade I Tube type: Oral Tube size: 8.0 mm Number of attempts: 1 Airway Equipment and Method: Stylet Placement Confirmation: ETT inserted through vocal cords under direct vision,  positive ETCO2 and breath sounds checked- equal and bilateral Secured at: 24 cm Tube  secured with: Tape Dental Injury: Teeth and Oropharynx as per pre-operative assessment

## 2016-01-03 NOTE — Anesthesia Postprocedure Evaluation (Signed)
Anesthesia Post Note  Patient: Leib E Zumbro  Procedure(s) Performed: Procedure(s) (LRB): ANEURYSM ABDOMINAL AORTA, BILATERAL COMMON ILIAC REPAIR (N/A)  Patient location during evaluation: PACU Anesthesia Type: General Level of consciousness: awake and alert, oriented and patient cooperative Pain management: pain level controlled Vital Signs Assessment: post-procedure vital signs reviewed and stable Respiratory status: spontaneous breathing, nonlabored ventilation, respiratory function stable and patient connected to nasal cannula oxygen Cardiovascular status: blood pressure returned to baseline and stable Postop Assessment: no signs of nausea or vomiting Anesthetic complications: no    Last Vitals:  Filed Vitals:   01/03/16 1530 01/03/16 1545  BP:    Pulse: 71 70  Temp: 36.4 C 36.3 C  Resp: 15 16    Last Pain:  Filed Vitals:   01/03/16 1546  PainSc: 2                  Simranjit Thayer,E. Karolyn Messing

## 2016-01-03 NOTE — Transfer of Care (Signed)
Immediate Anesthesia Transfer of Care Note  Patient: [name] [name]  Procedure(s) Performed: Procedure(s): ANEURYSM ABDOMINAL AORTA, BILATERAL COMMON ILIAC REPAIR (N/A)  Patient Location: PACU  Anesthesia Type:General  Level of Consciousness: awake  Airway & Oxygen Therapy: Patient Spontanous Breathing and Patient connected to face mask oxygen  Post-op Assessment: Report given to RN, Post -op Vital signs reviewed and stable and Patient moving all extremities X 4  Post vital signs: stable  Last Vitals:  Filed Vitals:   01/03/16 0617  BP: 158/95  Pulse: 86  Temp: 36.3 C  Resp: 20    Complications: No apparent anesthesia complications

## 2016-01-03 NOTE — H&P (Signed)
[name] [name]  12/02/2015 9:45 AM  Office Visit  MRN:  [id number]   Description: Male DOB: 06-21-1941  Provider: [name], MD  Department: [facility]       Diagnoses     Abdominal aortic aneurysm without rupture (HCC) - Primary    ICD-9-CM: 441.4 ICD-10-CM: I71.4       Reason for Visit     Re-evaluation    needs to discuss surgery     Reason for Visit History        Current Vitals  Most recent update: 12/02/2015 9:27 AM by Olene Craven Maness-Harrison, CMA    BP Pulse Ht Wt BMI SpO2    134/88 mmHg 96 6\' 1"  (1.854 m) 218 lb 3.2 oz (98.975 kg) 28.79 kg/m2 96%    Vitals History     BMI Data     Body Mass Index Body Surface Area    28.79 kg/m 2 2.26 m 2      Progress Notes      [name], MD at 12/02/2015 11:51 AM     Status: Signed       Expand All Collapse All     [facility] [place]  Patient name: [name] Givler SecrestMRN: [id number] DOB: [birth year]Sex: male  REASON FOR VISIT: Discuss aneurysm repair  HPI: CINDY BRINDISI is a 75 y.o. male Followed for a number of years with infrarenal abdominal aortic aneurysm. I'd seen him one month ago with CT scan showing further expansion to 5.1 cm. We discussed continued observation versus elective repair. He is insistent on elective repair. He is quite anxious regarding risk for rupture. Since my last visit he saw pulmonary and cardiology. I have reviewed evaluation from both of these. He is felt to be an acceptable risk for surgery either stent graft or open. He has a well preserved ejection fraction.  Past Medical History  Diagnosis Date  . Atrial fibrillation (HCC)     s/p prior DCCV; Amiodarone Rx.  . CAD (coronary artery disease)     s/p CABG; cath 10/07: LM ok, LAD occluded, AV CFX 95%, pRCA occluded; L-LAD, S-OM2/OM3, S-PDA ok  . Cerebrovascular disease     s/p prior Left CEA; followed by Dr. [name]    . COPD (chronic obstructive pulmonary disease) (HCC)   . Hyperlipidemia   . Hypertension   . Mitral insufficiency     hx of. not noted on echo 01/2012  . Nephrolithiasis   . Ischemic cardiomyopathy     echo 5/09: EF 55%, mild LAE; Myoview 11/10: inf and apical scar with mild peri-infarct ischemia, EF 44%. Echo 01/26/12 with EF 20-25% but previously known to be 30% on echoes before last one  . Iliac artery aneurysm, left (HCC)     followed by Dr. [name]  . Aortic dissection (HCC)     H/O focal aortic dissection  . LBBB (left bundle branch block)   . CHF (congestive heart failure) (HCC)   . Myocardial infarction (HCC)   . Renal insufficiency     01/2012  . Stroke (HCC)   . CVA (cerebral infarction)   . DVT (deep venous thrombosis) (HCC)   . Fall from slipping on wet surface Apr 15, 2015    Fx 3 Vertabrea    Family History  Problem Relation Age of Onset  . Stroke Maternal Uncle     SOCIAL HISTORY: Social History  Substance Use Topics  . Smoking status: Former Smoker -- 1.50 packs/day for 57 years  Types: Cigarettes    Quit date: 12/11/2004  . Smokeless tobacco: Never Used  . Alcohol Use: No     Comment: 6 yrs ago     No Known Allergies  Current Outpatient Prescriptions  Medication Sig Dispense Refill  . albuterol (PROVENTIL HFA;VENTOLIN HFA) 108 (90 BASE) MCG/ACT inhaler Inhale 2 puffs into the lungs every 4 (four) hours as needed for wheezing. 1 Inhaler 0  . amLODipine (NORVASC) 10 MG tablet TAKE 1 TABLET EVERY DAY 90 tablet 2  . dabigatran (PRADAXA) 150 MG CAPS capsule Take 1 capsule (150 mg total) by mouth 2 (two) times daily. 180 capsule 3  . docusate sodium (COLACE) 100 MG capsule Take 1 capsule (100 mg total) by mouth every 12 (twelve) hours. 60 capsule 0  . furosemide (LASIX) 40 MG tablet Take 1 tablet (40 mg total) by mouth daily.  (Patient taking differently: Take 40 mg by mouth daily as needed. ) 30 tablet 12  . hydrALAZINE (APRESOLINE) 50 MG tablet Take 0.5 tablets (25 mg total) by mouth 3 (three) times daily. 135 tablet 3  . HYDROcodone-acetaminophen (NORCO/VICODIN) 5-325 MG tablet Take 1 tablet by mouth every 6 (six) hours as needed. 10 tablet 0  . ibuprofen (ADVIL,MOTRIN) 200 MG tablet Take 400 mg by mouth every 6 (six) hours as needed for mild pain or moderate pain.    [name] ipratropium-albuterol (DUONEB) 0.5-2.5 (3) MG/3ML SOLN Take 3 mLs by nebulization every 4 (four) hours as needed (for shortness of breath). 360 mL 5  . levofloxacin (LEVAQUIN) 500 MG tablet Take 1 tablet (500 mg total) by mouth daily. 10 tablet 0  . lisinopril (PRINIVIL,ZESTRIL) 40 MG tablet TAKE 1 TABLET EVERY DAY 90 tablet 3  . metoprolol succinate (TOPROL-XL) 25 MG 24 hr tablet Take 1 pill daily (Patient taking differently: Take 12.5 mg by mouth daily. ) 45 tablet 3  . Multiple Vitamin (MULITIVITAMIN WITH MINERALS) TABS Take 1 tablet by mouth daily.    . niacin (NIASPAN) 500 MG CR tablet Take 1,000 mg by mouth 2 (two) times daily.     . potassium chloride SA (K-DUR,KLOR-CON) 20 MEQ tablet TAKE 2 TABLETS THREE TIMES DAILY 540 tablet 0  . pravastatin (PRAVACHOL) 80 MG tablet Take 1 tablet (80 mg total) by mouth daily. 90 tablet 2  . tiotropium (SPIRIVA HANDIHALER) 18 MCG inhalation capsule Place 1 capsule (18 mcg total) into inhaler and inhale daily. 30 capsule 2   No current facility-administered medications for this visit.    REVIEW OF SYSTEMS:  [X]  denotes positive finding, [ ]  denotes negative finding Cardiac  Comments:  Chest pain or chest pressure:    Shortness of breath upon exertion:    Short of breath when lying flat:    Irregular heart rhythm:        Vascular    Pain in calf, thigh, or hip brought on by ambulation:    Pain in feet at night  that wakes you up from your sleep:     Blood clot in your veins:    Leg swelling:         Pulmonary    Oxygen at home:    Productive cough:     Wheezing:         Neurologic    Sudden weakness in arms or legs:     Sudden numbness in arms or legs:     Sudden onset of difficulty speaking or slurred speech:    Temporary loss of vision in one eye:  Problems with dizziness:         Gastrointestinal    Blood in stool:     Vomited blood:         Genitourinary    Burning when urinating:     Blood in urine:        Psychiatric    Major depression:         Hematologic    Bleeding problems:    Problems with blood clotting too easily:        Skin    Rashes or ulcers:        Constitutional    Fever or chills:      PHYSICAL EXAM: Filed Vitals:   12/02/15 0923 12/02/15 0925  BP: 146/80 134/88  Pulse: 96   Height: 6\' 1"  (1.854 m)   Weight: 218 lb 3.2 oz (98.975 kg)   SpO2: 96%     GENERAL: The patient is a well-nourished male, in no acute distress. The vital signs are documented above. VASCULAR: 2+ dorsalis pedis pulses bilaterally PULMONARY: There is good air exchange bilaterally  MUSCULOSKELETAL: There are no major deformities or cyanosis. NEUROLOGIC: No focal weakness or paresthesias are detected. SKIN: There are no ulcers or rashes noted. PSYCHIATRIC: The patient has a normal affect.  DATA:  again reviewed his recent CT discussed this at length with the patient. He does have a 5.1 cm aneurysm. There is a great deal of mural thrombus just below the takeoff of the renal arteries on the posterior aspect of his aorta. The artery comes normal caliber again below this area with no thrombus and then dilates up to 5.1 cm. He does have ectasia of his iliac arteries bilaterally.  MEDICAL ISSUES: expanding infrarenal abdominal aortic aneurysm. I vey  long discussion with patient regarding options of stent graft repair versus open repair. I'm concerned regarding the posterior thrombus just below the level renal arteries. I think that he has a high likelihood of being able to seal his aorta below this area with a stent graft but explained that there would be a possibility that this would not be adequate repair. He does not want to pursue this as a possibility. He wishes to proceed with open aneurysm repair. Discussed the difference magnitude for recovery with a higher risk for postoperative complications with open repair. Did explain the much more certain long-term repair with open surgery. He wishes to proceed with this. I explained that he would have proximally 1-2 days in the intensive care unit in approximate 5 to seven-day hospitalization. Explained to take several months to return to his usual baseline stamina. He understands and wished to proceed as soon as possible. We will schedule him after the first of the year.  No Follow-up on file.   Gretta Began [facility] [place] Beeper: [phone]                Psych Notes     No notes of this type exist for this encounter.     [facility] Patient Outreach     No notes of this type exist for this encounter.     Not recorded        Referring Provider     Gordan Payment, MD     Level of Service     PR OFFICE OUTPATIENT VISIT 25 MINUTES [phone]         All Flowsheet Templates (all recorded)     Amb Nursing Assessment    Anthropometrics  Custom Formula Data    Encounter Vitals    Infectious Disease Screening    MEWS Score      Referring Provider     Gordan Payment, MD     All Charges for This Encounter     Code Description Service Date Service Provider Modifiers Qty    [phone] PR OFFICE OUTPATIENT VISIT 25 MINUTES 12/02/2015 [name], MD  1    [phone] PR ASA/ANTIPLAT THER USED 12/02/2015 [name], MD  1     [phone] PR CURRENT TOBACCO NON-USER 12/02/2015 [name], MD  1      Communications Sent     There are no sent or routed communications associated with this encounter.     AVS Reports     No AVS Snapshots are available for this encounter.     Smoking Cessation Audit Trail       Ready to Quit Counseling Given User Date and Time    Office Visit - 07/30/2012 Not Answered No Merdis Delay, RMA 07/30/2012 2:35 PM    Office Visit - 07/16/2012 Not Answered No Merdis Delay, RMA 07/16/2012 8:40 AM      Diabetic Foot Exam    No data filed     Diabetic Foot Form - Detailed    No data filed     Diabetic Foot Exam - Simple    No data filed     Guarantor Account: [employer], Burkitt ([id number])     Relation to Patient: Account Type Service Area    Self Personal/Family [place] SERVICE AREA       Coverages for This Account     Coverage ID Payor Plan Insurance ID    [id number] MEDICARE MEDICARE PART A AND B [id number] A    [id number] [facility] SHIELD BCBS SUPPLEMENT [id number]        Guarantor Account: Easton, Fetty ([id number])     Relation to Patient: Account Type Service Area    Self Personal/Family [facility]       Coverages for This Account     Coverage ID Payor Plan Insurance ID    [id number] MEDICARE MEDICARE PART A AND B [id number] A    [id number] [facility] SHIELD BCBS SUPPLEMENT [id number]        Guarantor Account: Ulysess, Witz ([id number])     Relation to Patient: Account Type Service Area    Self Personal/Family [place] [facility]       Coverages for This Account     Coverage ID Payor Plan Insurance ID    [id number] MEDICARE MEDICARE PART A AND B [id number] A    [id number] [facility] SHIELD BCBS SUPPLEMENT [id number]        Guarantor Account: Akshar, Starnes ([id number])     Relation to Patient: Account Type Service Area     Self Personal/Family [facility]          Guarantor Account: Montel, Vanderhoof ([id number])     Relation to Patient: Account Type Service Area    Self Personal/Family [place] [facility]          History     Reviewed By Date/Time Sections Reviewed    Gretta Began, MD 12/02/2015 11:51 AM Medical, Surgical, Tobacco, Alcohol, Drug Use, Sexual Activity, Family, Social Documentation    Dannielle Karvonen, CMA 12/02/2015 9:26 AM Tobacco    MANESS-HARRISON, CHANDA C, CMA 12/02/2015 9:25 AM Tobacco  Dannielle Karvonen, CMA 12/02/2015 9:24 AM Tobacco      Addendum:  The patient has been re-examined and re-evaluated.  The patient's history and physical has been reviewed and is unchanged.    ALFORD RENTMEESTER is a 75 y.o. male is being admitted with Abdominal aortic aneurysm I71.4. All the risks, benefits and other treatment options have been discussed with the patient. The patient has consented to proceed with Procedure(s): ANEURYSM ABDOMINAL AORTIC REPAIR as a surgical intervention.  Gretta Began 01/03/2016 6:49 AM Vascular and Vein Surgery

## 2016-01-04 ENCOUNTER — Encounter (HOSPITAL_COMMUNITY): Payer: Self-pay | Admitting: Vascular Surgery

## 2016-01-04 ENCOUNTER — Inpatient Hospital Stay (HOSPITAL_COMMUNITY): Payer: Medicare Other

## 2016-01-04 LAB — COMPREHENSIVE METABOLIC PANEL
ALT: 33 U/L (ref 17–63)
ANION GAP: 9 (ref 5–15)
AST: 16 U/L (ref 15–41)
Albumin: 2.7 g/dL — ABNORMAL LOW (ref 3.5–5.0)
Alkaline Phosphatase: 52 U/L (ref 38–126)
BUN: 30 mg/dL — ABNORMAL HIGH (ref 6–20)
CHLORIDE: 110 mmol/L (ref 101–111)
CO2: 26 mmol/L (ref 22–32)
Calcium: 7.5 mg/dL — ABNORMAL LOW (ref 8.9–10.3)
Creatinine, Ser: 1.47 mg/dL — ABNORMAL HIGH (ref 0.61–1.24)
GFR calc non Af Amer: 46 mL/min — ABNORMAL LOW (ref >60)
GFR, EST AFRICAN AMERICAN: 53 mL/min — AB (ref >60)
Glucose, Bld: 155 mg/dL — ABNORMAL HIGH (ref 65–99)
POTASSIUM: 4.3 mmol/L (ref 3.5–5.1)
SODIUM: 145 mmol/L (ref 135–145)
Total Bilirubin: 0.9 mg/dL (ref 0.3–1.2)
Total Protein: 4.9 g/dL — ABNORMAL LOW (ref 6.5–8.1)

## 2016-01-04 LAB — TYPE AND SCREEN
ABO/RH(D): O POS
ANTIBODY SCREEN: NEGATIVE
UNIT DIVISION: 0
Unit division: 0
Unit division: 0
Unit division: 0

## 2016-01-04 LAB — CBC
HCT: 44.3 % (ref 39.0–52.0)
Hemoglobin: 13.5 g/dL (ref 13.0–17.0)
MCH: 27.8 pg (ref 26.0–34.0)
MCHC: 30.5 g/dL (ref 30.0–36.0)
MCV: 91.2 fL (ref 78.0–100.0)
PLATELETS: 175 10*3/uL (ref 150–400)
RBC: 4.86 MIL/uL (ref 4.22–5.81)
RDW: 16.2 % — ABNORMAL HIGH (ref 11.5–15.5)
WBC: 15.1 10*3/uL — ABNORMAL HIGH (ref 4.0–10.5)

## 2016-01-04 LAB — AMYLASE: AMYLASE: 83 U/L (ref 28–100)

## 2016-01-04 LAB — MAGNESIUM: Magnesium: 2.2 mg/dL (ref 1.7–2.4)

## 2016-01-04 MED ORDER — CHLORHEXIDINE GLUCONATE 0.12 % MT SOLN
15.0000 mL | Freq: Two times a day (BID) | OROMUCOSAL | Status: DC
  Administered 2016-01-04 – 2016-01-05 (×3): 15 mL via OROMUCOSAL
  Filled 2016-01-04 (×2): qty 15

## 2016-01-04 MED ORDER — FUROSEMIDE 10 MG/ML IJ SOLN
20.0000 mg | Freq: Once | INTRAMUSCULAR | Status: AC
  Administered 2016-01-04: 20 mg via INTRAVENOUS
  Filled 2016-01-04: qty 2

## 2016-01-04 MED ORDER — HEPARIN SODIUM (PORCINE) 5000 UNIT/ML IJ SOLN
5000.0000 [IU] | Freq: Three times a day (TID) | INTRAMUSCULAR | Status: DC
  Administered 2016-01-04 – 2016-01-05 (×3): 5000 [IU] via SUBCUTANEOUS
  Filled 2016-01-04 (×3): qty 1

## 2016-01-04 MED ORDER — CETYLPYRIDINIUM CHLORIDE 0.05 % MT LIQD
7.0000 mL | Freq: Two times a day (BID) | OROMUCOSAL | Status: DC
  Administered 2016-01-04 (×2): 7 mL via OROMUCOSAL

## 2016-01-04 NOTE — Evaluation (Addendum)
Physical Therapy Evaluation Patient Details Name: [name] [name] MRN: [id number] DOB: 08-14-1941 Today's Date: 01/04/2016   History of Present Illness  pt is a 75 y/o male with h/o CAD s/p CABG, CVD, COPD, HTN, ICM, CHF, MI, CVA, CVT, Pacer, admitted for elective repair of and AAA and L common iliac artery aneurysm.  Clinical Impression  Pt admitted with/for elective repair of AAA and common iliac aneurysms..  Pt currently limited functionally due to the problems listed below.  (see problems list.)  Pt will benefit from PT to maximize function and safety to be able to get home safely with available assist of family.     Follow Up Recommendations Home health PT;Supervision/Assistance - 24 hour;Other (comment) (will assess further once a full mobility assessment done)    Equipment Recommendations  None recommended by PT    Recommendations for Other Services       Precautions / Restrictions Precautions Precautions: Fall Restrictions Weight Bearing Restrictions:  (Sternal precautions)      Mobility  Bed Mobility Overal bed mobility: Needs Assistance Bed Mobility: Supine to Sit     Supine to sit: Min assist     General bed mobility comments: Came up via left elbow, but still needed to roll more to the side before coming up to lessen pain.  Transfers                 General transfer comment: pt refused to get to chair this time up.  Ambulation/Gait                Stairs            Wheelchair Mobility    Modified Rankin (Stroke Patients Only)       Balance Overall balance assessment: Needs assistance Sitting-balance support: No upper extremity supported;Single extremity supported;Bilateral upper extremity supported Sitting balance-Leahy Scale: Fair Sitting balance - Comments: prefers propping up on both UE's, but not necessarty.                                     Pertinent Vitals/Pain Pain Assessment: 0-10 Pain Score:  10-Worst pain ever Pain Location: abdomen Pain Descriptors / Indicators: Operative site guarding;Burning;Sore Pain Intervention(s): Monitored during session    Home Living Family/patient expects to be discharged to:: Private residence Living Arrangements: Other (Comment) (lives with step daughter who works variable retail hours.) Available Help at Discharge: Family;Available PRN/intermittently;Other (Comment) (pt's daughter states she will help work out 24/7 hours) Type of Home: Mobile home Home Access: Stairs to enter Entrance Stairs-Rails: Can reach both Entrance Stairs-Number of Steps: 2 Home Layout: One level Home Equipment: Walker - 2 wheels;Grab bars - tub/shower;Wheelchair - manual;Cane - single point;Other (comment);Bedside commode;Tub bench      Prior Function Level of Independence: Independent               Hand Dominance   Dominant Hand: Right    Extremity/Trunk Assessment   Upper Extremity Assessment: Defer to OT evaluation           Lower Extremity Assessment: Overall WFL for tasks assessed;Generalized weakness (proximal weakness likely due to pain)         Communication   Communication: No difficulties  Cognition Arousal/Alertness: Awake/alert Behavior During Therapy: Anxious;WFL for tasks assessed/performed Overall Cognitive Status: Within Functional Limits for tasks assessed  General Comments General comments (skin integrity, edema, etc.): Sitting EOB,  HR 105 bpm, SpO2 93-98%, BP initially 140/118,  map in 110's, later post morphine, BP 115/82 with map 80    Exercises        Assessment/Plan    PT Assessment Patient needs continued PT services  PT Diagnosis Acute pain;Generalized weakness   PT Problem List Decreased strength;Decreased activity tolerance;Decreased mobility;Decreased knowledge of use of DME;Decreased knowledge of precautions;Cardiopulmonary status limiting activity;Pain  PT Treatment  Interventions DME instruction;Gait training;Stair training;Functional mobility training;Therapeutic activities;Balance training;Patient/family education   PT Goals (Current goals can be found in the Care Plan section) Acute Rehab PT Goals Patient Stated Goal: get back home PT Goal Formulation: With patient Time For Goal Achievement: 01/18/16 Potential to Achieve Goals: Good    Frequency Min 3X/week   Barriers to discharge        Co-evaluation               End of Session   Activity Tolerance: Patient limited by pain Patient left: in bed;with call bell/phone within reach;with family/visitor present Nurse Communication: Mobility status         Time: 1610-9604 PT Time Calculation (min) (ACUTE ONLY): 37 min   Charges:   PT Evaluation $PT Eval Moderate Complexity: 1 Procedure PT Treatments $Therapeutic Activity: 8-22 mins   PT G Codes:        Jalei Shibley, Eliseo Gum 01/04/2016, 11:06 AM 01/04/2016  [place] Bing, PT [phone]  (pager)

## 2016-01-04 NOTE — Evaluation (Addendum)
OT Cancellation Note  Patient Details Name: ELSWORTH CASTEEL MRN: [id number] DOB: 03-May-1941   Cancelled Treatment:     Pt states "no I am going to move at my pace and I may or may not get up tonight or tomorrow. I dont know. i am moving at my pace." Announcement made for visiting hours to end in the ICU. Pt looks at daughter standing up and daughter states "i will be back at 4pm" Pt states "NO you sit back down. You are not leaving. " Pt smirking at therapist and unable to provide a clear time to return to provide evaluation. Pt medicated prior to session and pt states "nope doesn't matter you come do and then leave and I am left here hurting" Pt again reminded of premedicated and Rn available as needed outside room. Pt again refused.   Ot to return to evaluate at the next available time and when patient feels ready to engage in therapy evaluation. Pt declined bed level rolling this session.   Boone Master B 01/04/2016, 2:11 PM   Mateo Flow   OTR/L Pager: [phone] Office: [phone] .

## 2016-01-04 NOTE — Plan of Care (Signed)
Problem: Phase I Progression Outcomes Goal: Activity progression as ordered Outcome: Not Met (add Reason) Pt refused position changes and OOB due to pain, in spite of education regarding pain control and OOB assisting with breathing Goal: Voiding after catheter removal Outcome: Not Met (add Reason) MD order to keep foley and diurese

## 2016-01-04 NOTE — Progress Notes (Signed)
Subjective: Interval History: none.. Comfortable this morning. No respiratory distress. Reports that the left toes were bruised and dark prior to admission.  Objective: Vital signs in last 24 hours: Temp:  [97.3 F (36.3 C)-98.6 F (37 C)] 98.4 F (36.9 C) (01/24 0700) Pulse Rate:  [50-94] 84 (01/24 0719) Resp:  [14-23] 18 (01/24 0719) BP: (99-155)/(78-110) 130/100 mmHg (01/24 0719) SpO2:  [95 %-99 %] 96 % (01/24 0719) Arterial Line BP: (121-165)/(59-85) 136/80 mmHg (01/24 0700)  Intake/Output from previous day: 01/23 0701 - 01/24 0700 In: 6122.9 [I.V.:4382.9; Blood:1080; NG/GT:110; IV Piggyback:550] Out: 3300 [Urine:1300; Emesis/NG output:200; Blood:1800] Intake/Output this shift:    Abdomen soft with mild tenderness. Palpable 1+ dorsalis pedis pulses bilaterally  Lab Results:  Recent Labs  01/03/16 1309 01/04/16 0305  WBC 22.5* 15.1*  HGB 14.2 13.5  HCT 44.2 44.3  PLT 184 175   BMET  Recent Labs  01/03/16 1309 01/04/16 0305  NA 145 145  K 4.0 4.3  CL 108 110  CO2 25 26  GLUCOSE 187* 155*  BUN 24* 30*  CREATININE 1.42* 1.47*  CALCIUM 7.7* 7.5*    Studies/Results: Dg Chest Port 1 View  01/04/2016  CLINICAL DATA:  Aneurysm repair. EXAM: PORTABLE CHEST 1 VIEW COMPARISON:  01/03/2016. FINDINGS: Swan-Ganz catheter and NG tube in stable position. Cardiac pacer stable position. Prior CABG. Stable cardiomegaly. Slight increase in diffuse bilateral from interstitial prominence suggesting congestive heart failure. No prominent pleural effusion or pneumothorax. IMPRESSION: 1. Lines and tubes in stable position. 2. Prior CABG. Persistent cardiomegaly with slight increase in pulmonary interstitial prominence bilaterally suggesting increasing mild congestive heart failure. Electronically Signed   By: Maisie Fus  Register   On: 01/04/2016 07:41   Dg Chest Port 1 View  01/03/2016  CLINICAL DATA:  Immediate postop abdominal aortic aneurysm repair with bifurcated graft. EXAM:  PORTABLE CHEST 1 VIEW COMPARISON:  09/09/2015 and earlier. FINDINGS: Right jugular Swan-Ganz catheter tip projects over the main pulmonary trunk. Nasogastric tube courses below the diaphragm into the proximal stomach. Left subclavian biventricular pacemaker unchanged and appears intact. Prior CABG. Cardiac silhouette moderately enlarged, unchanged, allowing for differences in technique. Pulmonary venous hypertension and mild interstitial pulmonary edema. Small right pleural effusion. No confluent airspace consolidation. No pneumothorax. No mediastinal hematoma. IMPRESSION: 1. Support apparatus satisfactory. 2. Mild CHF and/or fluid overload with stable moderate cardiomegaly, mild interstitial pulmonary edema and small right pleural effusion. Electronically Signed   By: Hulan Saas M.D.   On: 01/03/2016 13:53   Dg Abd Portable 1v  01/03/2016  CLINICAL DATA:  Abdominal aortic aneurysm repair. EXAM: PORTABLE ABDOMEN - 1 VIEW COMPARISON:  None. FINDINGS: Portable supine view the abdomen at 1342 hours shows a nonspecific bowel gas pattern. Curvilinear lucency is seen along the liver, potentially along falciform ligament. Bones are diffusely demineralized. Patient is status post multilevel lumbar vertebral augmentation. NG tube tip overlies the medial stomach. IMPRESSION: Curvilinear lucency along the liver may be intraperitoneal free air outlining the liver surface. The there is clinical concern for intraperitoneal free air, lateral decubitus x-ray may prove helpful to further evaluate. Nonspecific bowel gas pattern. These results will be called to the ordering clinician or representative by the Radiologist Assistant, and communication documented in the PACS or zVision Dashboard. Electronically Signed   By: Kennith Center M.D.   On: 01/03/2016 13:52   Anti-infectives: Anti-infectives    Start     Dose/Rate Route Frequency Ordered Stop   01/03/16 1930  cefUROXime (ZINACEF) 1.5 g in dextrose 5 %  50 mL IVPB      1.5 g 100 mL/hr over 30 Minutes Intravenous Every 12 hours 01/03/16 1531 01/04/16 1929   01/02/16 1034  cefUROXime (ZINACEF) 1.5 g in dextrose 5 % 50 mL IVPB     1.5 g 100 mL/hr over 30 Minutes Intravenous 30 min pre-op 01/02/16 1034 01/03/16 0801      Assessment/Plan: s/p Procedure(s): ANEURYSM ABDOMINAL AORTA, BILATERAL COMMON ILIAC REPAIR (N/A) Stable postop day 1. Will decrease IV fluids 75 cc per hour. Will give 20 mg of Lasix. DC Foley catheter in the morning. DC a line NG and Swan today.   LOS: 1 day   Gretta Began 01/04/2016, 8:09 AM

## 2016-01-04 NOTE — Plan of Care (Signed)
Problem: Phase I Progression Outcomes Goal: Voiding after catheter removal Outcome: Not Progressing Foley still in place. Goal: If Diabetic, blood sugar < 150 Outcome: Progressing Pt not diabetic.

## 2016-01-04 NOTE — Progress Notes (Signed)
Pt c/o SOB after PRN breathing tx.  Pt now with exp/ins wheezing and rales auscultated.  Contacted on call PA for [facility] [name] [name] for one time dose of lasix 20mg 

## 2016-01-04 NOTE — Progress Notes (Signed)
  Progress Note    01/04/2016 7:59 AM 1 Day Post-Op  Subjective:  Having some shortness of breath. Ready to have tube removed from nose-wants to change rooms-too strict.  Pt states bruising on toes was there before surgery.  Afebrile HR 70's-90's NSR 110's-130's systolic 96% 3LO2NC  Filed Vitals:   01/04/16 0700 01/04/16 0719  BP: 130/100 130/100  Pulse: 50 84  Temp: 98.4 F (36.9 C)   Resp: 19 18    Physical Exam: Cardiac:  regular Lungs:  Decreased BS at bases bilaterally Incisions:  Bandage in tact and dry Extremities:  Palpable DP pulses bilaterally Abdomen:  Soft; NT/ND; -BS  CBC    Component Value Date/Time   WBC 15.1* 01/04/2016 0305   RBC 4.86 01/04/2016 0305   HGB 13.5 01/04/2016 0305   HCT 44.3 01/04/2016 0305   PLT 175 01/04/2016 0305   MCV 91.2 01/04/2016 0305   MCH 27.8 01/04/2016 0305   MCHC 30.5 01/04/2016 0305   RDW 16.2* 01/04/2016 0305   LYMPHSABS 2.1 10/24/2015 1955   MONOABS 0.4 10/24/2015 1955   EOSABS 0.2 10/24/2015 1955   BASOSABS 0.0 10/24/2015 1955    BMET    Component Value Date/Time   NA 145 01/04/2016 0305   K 4.3 01/04/2016 0305   CL 110 01/04/2016 0305   CO2 26 01/04/2016 0305   GLUCOSE 155* 01/04/2016 0305   BUN 30* 01/04/2016 0305   CREATININE 1.47* 01/04/2016 0305   CREATININE 1.24 02/05/2015 1215   CALCIUM 7.5* 01/04/2016 0305   GFRNONAA 46* 01/04/2016 0305   GFRNONAA 58* 02/05/2015 1215   GFRAA 53* 01/04/2016 0305   GFRAA 67 02/05/2015 1215    INR    Component Value Date/Time   INR 1.26 01/03/2016 1309   INR 2.3 12/15/2014 1416   INR 2.6 02/20/2011 1137     Intake/Output Summary (Last 24 hours) at 01/04/16 0759 Last data filed at 01/04/16 0600  Gross per 24 hour  Intake 6122.92 ml  Output   3300 ml  Net 2822.92 ml   NGT output: 200cc/24hr (50cc last shift)  Assessment:  75 y.o. male is s/p:  Aortic and iliac aneurysm repair with a 22 x 11 Hemashield aorta to bilateral common iliac artery bypass  1  Day Post-Op  Plan: -pt doing well this morning-he has palpable DP pusles bilaterally -bruising on toes present pre operatively according to pt -NGT with 200cc out since surgery and only 50cc last shift-will d/c and keep strict npo. -d/c swan -creatinine stable at 1.47 - was 1.44 preoperatively on 1/16 -pulmonary congestion on PCXR-will give gentle dose of IV lasix x 1 dose.  IVF at 75cc-may need to decrease rate-will d/w Dr. [name] -WBC improved from yesterday afternoon-most likely related to peri-op -hgb stable this morning-pt was on Pradaxa pre-op - will start SQ heparin for DVT prophylaxis this afternoon (hold on Lovenox given Cr of 1.47).  Pt states he was on Warfarin for many years due to blood clots then had a stroke and he was switched to Pradaxa.    Doreatha Massed, PA-C [facility] [phone] 01/04/2016 7:59 AM

## 2016-01-05 LAB — BASIC METABOLIC PANEL
ANION GAP: 15 (ref 5–15)
BUN: 29 mg/dL — ABNORMAL HIGH (ref 6–20)
CALCIUM: 7.1 mg/dL — AB (ref 8.9–10.3)
CO2: 26 mmol/L (ref 22–32)
Chloride: 105 mmol/L (ref 101–111)
Creatinine, Ser: 1.43 mg/dL — ABNORMAL HIGH (ref 0.61–1.24)
GFR, EST AFRICAN AMERICAN: 55 mL/min — AB (ref >60)
GFR, EST NON AFRICAN AMERICAN: 47 mL/min — AB (ref >60)
Glucose, Bld: 117 mg/dL — ABNORMAL HIGH (ref 65–99)
Potassium: 4.5 mmol/L (ref 3.5–5.1)
Sodium: 146 mmol/L — ABNORMAL HIGH (ref 135–145)

## 2016-01-05 LAB — CBC
HEMATOCRIT: 43.1 % (ref 39.0–52.0)
Hemoglobin: 13.2 g/dL (ref 13.0–17.0)
MCH: 28.3 pg (ref 26.0–34.0)
MCHC: 30.6 g/dL (ref 30.0–36.0)
MCV: 92.3 fL (ref 78.0–100.0)
PLATELETS: 156 10*3/uL (ref 150–400)
RBC: 4.67 MIL/uL (ref 4.22–5.81)
RDW: 16.2 % — AB (ref 11.5–15.5)
WBC: 15.7 10*3/uL — AB (ref 4.0–10.5)

## 2016-01-05 MED ORDER — CETYLPYRIDINIUM CHLORIDE 0.05 % MT LIQD: 7.0000 mL | Freq: Two times a day (BID) | OROMUCOSAL | Status: DC

## 2016-01-05 MED ORDER — BISACODYL 10 MG RE SUPP
10.0000 mg | Freq: Once | RECTAL | Status: AC
Start: 2016-01-05 — End: 2016-01-05
  Administered 2016-01-05: 10 mg via RECTAL
  Filled 2016-01-05: qty 1

## 2016-01-05 MED ORDER — AMIODARONE HCL 200 MG PO TABS
200.0000 mg | ORAL_TABLET | Freq: Every day | ORAL | Status: DC
  Administered 2016-01-05 – 2016-01-07 (×3): 200 mg via ORAL
  Filled 2016-01-05 (×3): qty 1

## 2016-01-05 MED ORDER — METOPROLOL TARTRATE 12.5 MG HALF TABLET
12.5000 mg | ORAL_TABLET | Freq: Two times a day (BID) | ORAL | Status: DC
  Administered 2016-01-05 – 2016-01-07 (×5): 12.5 mg via ORAL
  Filled 2016-01-05 (×5): qty 1

## 2016-01-05 MED ORDER — TRAMADOL HCL 50 MG PO TABS
50.0000 mg | ORAL_TABLET | Freq: Four times a day (QID) | ORAL | Status: DC | PRN
  Administered 2016-01-05 – 2016-01-06 (×3): 50 mg via ORAL
  Filled 2016-01-05 (×3): qty 1

## 2016-01-05 MED ORDER — CITALOPRAM HYDROBROMIDE 20 MG PO TABS
10.0000 mg | ORAL_TABLET | Freq: Every day | ORAL | Status: DC
  Administered 2016-01-05 – 2016-01-07 (×3): 10 mg via ORAL
  Filled 2016-01-05 (×3): qty 1

## 2016-01-05 MED ORDER — DABIGATRAN ETEXILATE MESYLATE 150 MG PO CAPS
150.0000 mg | ORAL_CAPSULE | Freq: Two times a day (BID) | ORAL | Status: DC
  Administered 2016-01-05 – 2016-01-06 (×4): 150 mg via ORAL
  Filled 2016-01-05 (×7): qty 1

## 2016-01-05 MED ORDER — FUROSEMIDE 10 MG/ML IJ SOLN
20.0000 mg | Freq: Two times a day (BID) | INTRAMUSCULAR | Status: AC
  Administered 2016-01-05 (×2): 20 mg via INTRAVENOUS
  Filled 2016-01-05 (×2): qty 2

## 2016-01-05 MED ORDER — MORPHINE SULFATE (PF) 2 MG/ML IV SOLN: 1.0000 mg | INTRAVENOUS | Status: DC | PRN

## 2016-01-05 MED ORDER — LISINOPRIL 40 MG PO TABS
40.0000 mg | ORAL_TABLET | Freq: Every day | ORAL | Status: DC
  Administered 2016-01-05 – 2016-01-07 (×3): 40 mg via ORAL
  Filled 2016-01-05: qty 4
  Filled 2016-01-05 (×2): qty 2

## 2016-01-05 MED ORDER — AMLODIPINE BESYLATE 10 MG PO TABS
10.0000 mg | ORAL_TABLET | Freq: Every day | ORAL | Status: DC
  Administered 2016-01-05 – 2016-01-07 (×3): 10 mg via ORAL
  Filled 2016-01-05 (×3): qty 1

## 2016-01-05 NOTE — Progress Notes (Signed)
PT Cancellation Note  Patient Details Name: [name] [name] MRN: [id number] DOB: Jul 06, 1941   Cancelled Treatment:    Reason Eval/Treat Not Completed: Patient declined, no reason specified.  Pt adamantly refused to participate or be pushed to do anything.  Daughter trying futilely to get him to participate.  Will try back as able. 01/05/2016  [place] Bing, PT [phone]  (pager)   Kristoff Coonradt, Eliseo Gum 01/05/2016, 10:15 AM

## 2016-01-05 NOTE — Plan of Care (Signed)
Problem: Phase I Progression Outcomes Goal: Activity progression as ordered Outcome: Not Progressing Pt up to chair once yesterday. Refused to work with PT and OT. Pt educated throughly with family in room about the importance of early mobilization after surgery. Goal: Voiding after catheter removal Outcome: Not Progressing Foley remains in place for IV lasix.

## 2016-01-05 NOTE — Progress Notes (Addendum)
  AAA Progress Note    01/05/2016 8:40 AM 2 Days Post-Op  Subjective:  C/o some shortness of breath; states he is passing gas and had a small bowel movement  Afebrile HR  70's-110  110's-160's systolic 96% 2LO2NC  Filed Vitals:   01/05/16 0630 01/05/16 0700  BP:  153/83  Pulse: 93 91  Temp:    Resp: 24 22    Physical Exam: Cardiac:  Regular Lungs:  Ins/exp wheezing throughout Abdomen:  Soft, NT/ND; +BS; +flatus Incisions:  C/d/i with steri strips in tact Extremities:  +palpable DP pulses bilaterally  CBC    Component Value Date/Time   WBC 15.7* 01/05/2016 0400   RBC 4.67 01/05/2016 0400   HGB 13.2 01/05/2016 0400   HCT 43.1 01/05/2016 0400   PLT 156 01/05/2016 0400   MCV 92.3 01/05/2016 0400   MCH 28.3 01/05/2016 0400   MCHC 30.6 01/05/2016 0400   RDW 16.2* 01/05/2016 0400   LYMPHSABS 2.1 10/24/2015 1955   MONOABS 0.4 10/24/2015 1955   EOSABS 0.2 10/24/2015 1955   BASOSABS 0.0 10/24/2015 1955    BMET    Component Value Date/Time   NA 146* 01/05/2016 0400   K 4.5 01/05/2016 0400   CL 105 01/05/2016 0400   CO2 26 01/05/2016 0400   GLUCOSE 117* 01/05/2016 0400   BUN 29* 01/05/2016 0400   CREATININE 1.43* 01/05/2016 0400   CREATININE 1.24 02/05/2015 1215   CALCIUM 7.1* 01/05/2016 0400   GFRNONAA 47* 01/05/2016 0400   GFRNONAA 58* 02/05/2015 1215   GFRAA 55* 01/05/2016 0400   GFRAA 67 02/05/2015 1215    INR    Component Value Date/Time   INR 1.26 01/03/2016 1309   INR 2.3 12/15/2014 1416   INR 2.6 02/20/2011 1137     Intake/Output Summary (Last 24 hours) at 01/05/16 0840 Last data filed at 01/05/16 0700  Gross per 24 hour  Intake   1650 ml  Output   1770 ml  Net   -120 ml     Assessment/Plan:  75 y.o. male is s/p  Aortic and iliac aneurysm repair with a 22 x 11 Hemashield aorta to bilateral common iliac artery bypass  2 Days Post-Op  -pt doing well this am and is passing flatus with small BM-denies N/V.  Will heplock IV and start  clear liquids this morning, if tolerates this, we can advance his diet later this afternoon for dinner -ins/exp wheezing-will give gentle diuresis bid today x 2 doses -will not supplement K+ as it is 4.5 -creatinine stable with good UOP -increase mobilization -will d/c foley in the am  after lasix -transfer to stepdown given respiratory issues -anticipate home in a few days -restart po meds   Doreatha Massed, PA-C [facility] [phone] 01/05/2016 8:40 AM  I have examined the patient, reviewed and agree with above.  Gretta Began, MD 01/05/2016 1:56 PM

## 2016-01-05 NOTE — Progress Notes (Signed)
Patient refused to walk X3, walked once at 1630 35ft, refused to walk any further states, "I will walk again later on my own time".  Hermina Barters, RN

## 2016-01-05 NOTE — Care Management Note (Signed)
Case Management Note  Patient Details  Name: [name] [name] MRN: [id number] Date of Birth: [birth year]  Subjective/Objective:      Per patient and daughter in room patient lives with step daughter and when she works someone else in the family will be able to assist.                Action/Plan:   Expected Discharge Date:                  Expected Discharge Plan:  Home w Home Health Services  In-House Referral:     Discharge planning Services  CM Consult  Post Acute Care Choice:    Choice offered to:     DME Arranged:    DME Agency:     HH Arranged:    HH Agency:     Status of Service:  In process, will continue to follow  Medicare Important Message Given:    Date Medicare IM Given:    Medicare IM give by:    Date Additional Medicare IM Given:    Additional Medicare Important Message give by:     If discussed at Long Length of Stay Meetings, dates discussed:    Additional Comments:  Vangie Bicker, RN 01/05/2016, 1:26 PM

## 2016-01-06 LAB — BASIC METABOLIC PANEL
ANION GAP: 5 (ref 5–15)
BUN: 26 mg/dL — ABNORMAL HIGH (ref 6–20)
CALCIUM: 7.5 mg/dL — AB (ref 8.9–10.3)
CO2: 29 mmol/L (ref 22–32)
Chloride: 108 mmol/L (ref 101–111)
Creatinine, Ser: 1.13 mg/dL (ref 0.61–1.24)
GLUCOSE: 100 mg/dL — AB (ref 65–99)
POTASSIUM: 3.8 mmol/L (ref 3.5–5.1)
Sodium: 142 mmol/L (ref 135–145)

## 2016-01-06 MED ORDER — TRAMADOL HCL 50 MG PO TABS: 50.0000 mg | ORAL_TABLET | Freq: Four times a day (QID) | ORAL | Status: DC | PRN

## 2016-01-06 NOTE — Progress Notes (Signed)
Physical Therapy Treatment Patient Details Name: [name] [name] MRN: [id number] DOB: 1941/06/18 Today's Date: 01/06/2016    History of Present Illness pt is a 75 y/o male with h/o CAD s/p CABG, CVD, COPD, HTN, ICM, CHF, MI, CVA, CVT, Pacer, admitted for elective repair of and AAA and L common iliac artery aneurysm.    PT Comments    Progressing steadily.  Sessions are somewhat limited due to patient dictates what will be done each treatment.  Emphasized gait today due to the fact pt had never agreed to mobilize with therapies.  Follow Up Recommendations  Home health PT;Supervision/Assistance - 24 hour;Other (comment)     Equipment Recommendations  None recommended by PT    Recommendations for Other Services       Precautions / Restrictions      Mobility  Bed Mobility               General bed mobility comments: already OOB in the recliner, not ready to get in bed  Transfers Overall transfer level: Needs assistance   Transfers: Sit to/from Stand Sit to Stand: Supervision            Ambulation/Gait Ambulation/Gait assistance: Supervision Ambulation Distance (Feet): 140 Feet Assistive device: None Gait Pattern/deviations: Step-through pattern Gait velocity: slower   General Gait Details: generally steady, mild wandering in the halls held in check  with a touch on his shoulder.   Stairs            Wheelchair Mobility    Modified Rankin (Stroke Patients Only)       Balance Overall balance assessment: Needs assistance   Sitting balance-Leahy Scale: Fair     Standing balance support: No upper extremity supported Standing balance-Leahy Scale: Fair                      Cognition Arousal/Alertness: Awake/alert Behavior During Therapy: WFL for tasks assessed/performed Overall Cognitive Status: Within Functional Limits for tasks assessed                      Exercises      General Comments General comments (skin  integrity, edema, etc.): Would not walk with oxygen.  SpO2 on RA after ambulation 90/91% with EHR at 88 bpm      Pertinent Vitals/Pain Pain Assessment: Faces Faces Pain Scale: Hurts a little bit Pain Location: abdoment Pain Descriptors / Indicators: Operative site guarding Pain Intervention(s): Monitored during session    Home Living                      Prior Function            PT Goals (current goals can now be found in the care plan section) Acute Rehab PT Goals Patient Stated Goal: get back home PT Goal Formulation: With patient Time For Goal Achievement: 01/18/16 Potential to Achieve Goals: Good Progress towards PT goals: Progressing toward goals    Frequency  Min 3X/week    PT Plan Current plan remains appropriate    Co-evaluation             End of Session   Activity Tolerance: Patient tolerated treatment well Patient left: in chair;with call bell/phone within reach;Other (comment)     Time: 4098-1191 PT Time Calculation (min) (ACUTE ONLY): 17 min  Charges:  $Gait Training: 8-22 mins  G Codes:      Tasha Jindra, Eliseo Gum 01/06/2016, 5:14 PM  01/06/2016  [place] Bing, PT [phone]  (pager)

## 2016-01-06 NOTE — Progress Notes (Signed)
Report called to [facility], pt tx to [facility] by [facility], RN with meds and chart.   Hermina Barters, RN

## 2016-01-06 NOTE — Progress Notes (Signed)
Subjective: Interval History: none.. No wheezing this morning more comfortable from a respiratory standpoint. Has had regular diet last evening and bowel movement.  Objective: Vital signs in last 24 hours: Temp:  [97.5 F (36.4 C)-97.8 F (36.6 C)] 97.5 F (36.4 C) (01/26 0733) Pulse Rate:  [70-92] 71 (01/26 0700) Resp:  [16-23] 20 (01/26 0700) BP: (102-142)/(58-101) 142/80 mmHg (01/26 0700) SpO2:  [90 %-100 %] 96 % (01/26 0700)  Intake/Output from previous day: 01/25 0701 - 01/26 0700 In: 151.3 [I.V.:151.3] Out: 1975 [Urine:1975] Intake/Output this shift:    Abdomen soft. Wound healing nicely.  Lab Results:  Recent Labs  01/04/16 0305 01/05/16 0400  WBC 15.1* 15.7*  HGB 13.5 13.2  HCT 44.3 43.1  PLT 175 156   BMET  Recent Labs  01/05/16 0400 01/06/16 0501  NA 146* 142  K 4.5 3.8  CL 105 108  CO2 26 29  GLUCOSE 117* 100*  BUN 29* 26*  CREATININE 1.43* 1.13  CALCIUM 7.1* 7.5*    Studies/Results: Dg Chest Port 1 View  01/04/2016  CLINICAL DATA:  Aneurysm repair. EXAM: PORTABLE CHEST 1 VIEW COMPARISON:  01/03/2016. FINDINGS: Swan-Ganz catheter and NG tube in stable position. Cardiac pacer stable position. Prior CABG. Stable cardiomegaly. Slight increase in diffuse bilateral from interstitial prominence suggesting congestive heart failure. No prominent pleural effusion or pneumothorax. IMPRESSION: 1. Lines and tubes in stable position. 2. Prior CABG. Persistent cardiomegaly with slight increase in pulmonary interstitial prominence bilaterally suggesting increasing mild congestive heart failure. Electronically Signed   By: Maisie Fus  Register   On: 01/04/2016 07:41   Dg Chest Port 1 View  01/03/2016  CLINICAL DATA:  Immediate postop abdominal aortic aneurysm repair with bifurcated graft. EXAM: PORTABLE CHEST 1 VIEW COMPARISON:  09/09/2015 and earlier. FINDINGS: Right jugular Swan-Ganz catheter tip projects over the main pulmonary trunk. Nasogastric tube courses below  the diaphragm into the proximal stomach. Left subclavian biventricular pacemaker unchanged and appears intact. Prior CABG. Cardiac silhouette moderately enlarged, unchanged, allowing for differences in technique. Pulmonary venous hypertension and mild interstitial pulmonary edema. Small right pleural effusion. No confluent airspace consolidation. No pneumothorax. No mediastinal hematoma. IMPRESSION: 1. Support apparatus satisfactory. 2. Mild CHF and/or fluid overload with stable moderate cardiomegaly, mild interstitial pulmonary edema and small right pleural effusion. Electronically Signed   By: Hulan Saas M.D.   On: 01/03/2016 13:53   Dg Abd Portable 1v  01/03/2016  CLINICAL DATA:  Abdominal aortic aneurysm repair. EXAM: PORTABLE ABDOMEN - 1 VIEW COMPARISON:  None. FINDINGS: Portable supine view the abdomen at 1342 hours shows a nonspecific bowel gas pattern. Curvilinear lucency is seen along the liver, potentially along falciform ligament. Bones are diffusely demineralized. Patient is status post multilevel lumbar vertebral augmentation. NG tube tip overlies the medial stomach. IMPRESSION: Curvilinear lucency along the liver may be intraperitoneal free air outlining the liver surface. The there is clinical concern for intraperitoneal free air, lateral decubitus x-ray may prove helpful to further evaluate. Nonspecific bowel gas pattern. These results will be called to the ordering clinician or representative by the Radiologist Assistant, and communication documented in the PACS or zVision Dashboard. Electronically Signed   By: Kennith Center M.D.   On: 01/03/2016 13:52   Anti-infectives: Anti-infectives    Start     Dose/Rate Route Frequency Ordered Stop   01/03/16 1930  cefUROXime (ZINACEF) 1.5 g in dextrose 5 % 50 mL IVPB     1.5 g 100 mL/hr over 30 Minutes Intravenous Every 12 hours 01/03/16 1531  01/04/16 0800   01/02/16 1034  cefUROXime (ZINACEF) 1.5 g in dextrose 5 % 50 mL IVPB     1.5 g 100  mL/hr over 30 Minutes Intravenous 30 min pre-op 01/02/16 1034 01/03/16 0801      Assessment/Plan: s/p Procedure(s): ANEURYSM ABDOMINAL AORTA, BILATERAL COMMON ILIAC REPAIR (N/A) Stable overall. Transfer to [facility] today. Continue to mobilize. Possible discharge in a.m.   LOS: 3 days   Lamarr Feenstra 01/06/2016, 8:43 AM

## 2016-01-07 ENCOUNTER — Telehealth: Payer: Self-pay | Admitting: Vascular Surgery

## 2016-01-07 MED ORDER — TRAMADOL HCL 50 MG PO TABS: 50.0000 mg | ORAL_TABLET | Freq: Four times a day (QID) | ORAL | Status: DC | PRN

## 2016-01-07 NOTE — Progress Notes (Addendum)
  [facility] Progress Note  Subjective  - POD #4  No complaints. Had BM last night. Ready to go home.   Objective Filed Vitals:   01/06/16 2040 01/07/16 0401  BP: 111/59 137/78  Pulse: 69 70  Temp: 97.3 F (36.3 C) 97.8 F (36.6 C)  Resp: 18 18    Intake/Output Summary (Last 24 hours) at 01/07/16 0928 Last data filed at 01/07/16 0914  Gross per 24 hour  Intake    240 ml  Output    800 ml  Net   -560 ml   Abdomen soft and nontender. Incision clean and intact.   Assessment/Planning: 75 y.o. male is s/p: open repair of abdominal aortic aneurysm and left common iliac artery aneurysm  4 Days Post-Op   Comfortable this morning.  Tolerated regular diet well. Had BM.  Ambulating adequately. Discharge home today. Patient says family will be available to help him at home and will not require HH or equipment.   [name] [name] 01/07/2016 9:28 AM --  Laboratory CBC    Component Value Date/Time   WBC 15.7* 01/05/2016 0400   HGB 13.2 01/05/2016 0400   HCT 43.1 01/05/2016 0400   PLT 156 01/05/2016 0400    BMET    Component Value Date/Time   NA 142 01/06/2016 0501   K 3.8 01/06/2016 0501   CL 108 01/06/2016 0501   CO2 29 01/06/2016 0501   GLUCOSE 100* 01/06/2016 0501   BUN 26* 01/06/2016 0501   CREATININE 1.13 01/06/2016 0501   CREATININE 1.24 02/05/2015 1215   CALCIUM 7.5* 01/06/2016 0501   GFRNONAA >60 01/06/2016 0501   GFRNONAA 58* 02/05/2015 1215   GFRAA >60 01/06/2016 0501   GFRAA 67 02/05/2015 1215    COAG Lab Results  Component Value Date   INR 1.26 01/03/2016   INR 1.15 12/27/2015   INR 1.32 01/10/2015   PROTIME 18.5 05/19/2009   No results found for: PTT  Antibiotics Anti-infectives    Start     Dose/Rate Route Frequency Ordered Stop   01/03/16 1930  cefUROXime (ZINACEF) 1.5 g in dextrose 5 % 50 mL IVPB     1.5 g 100 mL/hr over 30 Minutes Intravenous Every 12 hours 01/03/16 1531 01/04/16 0800   01/02/16 1034  cefUROXime  (ZINACEF) 1.5 g in dextrose 5 % 50 mL IVPB     1.5 g 100 mL/hr over 30 Minutes Intravenous 30 min pre-op 01/02/16 1034 01/03/16 0801       Maris Berger, PA-C [facility] Office: [phone] Pager: [phone] 01/07/2016 9:28 AM     I have examined the patient, reviewed and agree with above.  Gretta Began, MD 01/07/2016 10:47 AM

## 2016-01-07 NOTE — Care Management Important Message (Signed)
Important Message  Patient Details  Name: [name] [name] MRN: [id number] Date of Birth: 1940/12/16   Medicare Important Message Given:  Yes    Kyla Balzarine 01/07/2016, 11:18 AM

## 2016-01-07 NOTE — Care Management Note (Signed)
Case Management Note  Patient Details  Name: [name] [name] MRN: [id number] Date of Birth: 12-27-1940  Subjective/Objective:      Per patient and daughter in room patient lives with step daughter and when she works someone else in the family will be able to assist.                Action/Plan:   Expected Discharge Date:                  Expected Discharge Plan:  Home w Home Health Services  In-House Referral:     Discharge planning Services  CM Consult  Post Acute Care Choice:    Choice offered to:     DME Arranged:    DME Agency:     HH Arranged:    HH Agency:     Status of Service:  Completed, signed off  Medicare Important Message Given:    Date Medicare IM Given:    Medicare IM give by:    Date Additional Medicare IM Given:    Additional Medicare Important Message give by:     If discussed at Long Length of Stay Meetings, dates discussed:    Additional Comments CM assessed pt prior to discharge.  Pt refusing HHPT states "I just don't need it, I want to be able to do what I want to do at my own speed".  Pt states he already has ; walker, cane, shower chair, sock puller at home, denies needing additional equipment.  Pt states step daughter will provide 24 hour supervision post discharge. Cherylann Parr, RN 01/07/2016, 10:20 AM

## 2016-01-07 NOTE — Progress Notes (Signed)
Pt has been discharged. Pt received discharge instructions and all questions were answered. Pt's IV and telemetry box were removed. Pt left the floor via wheelchair and was accompanied by a Ferne Coe. Pt was in no distress at time of discharge.  Berdine Dance RN, BSN

## 2016-01-07 NOTE — Care Management Important Message (Signed)
Important Message  Patient Details  Name: [name] [name] MRN: [id number] Date of Birth: 04/30/1941   Medicare Important Message Given:  Yes    Cherylann Parr, RN 01/07/2016, 10:21 AM

## 2016-01-07 NOTE — Evaluation (Signed)
Occupational Therapy Evaluation Patient Details Name: [name] [name] MRN: [id number] DOB: 05/08/1941 Today's Date: 01/07/2016    History of Present Illness pt is a 75 y/o male with h/o CAD s/p CABG, CVD, COPD, HTN, ICM, CHF, MI, CVA, CVT, Pacer, admitted for elective repair of and AAA and L common iliac artery aneurysm.   Clinical Impression   Pt was independent prior to admission. Presents with reported mild incisional pain that interferes with ability to perform LB ADL. Pt has all necessary adaptive equipment at home he can use. Pt requiring supervision for all mobility, does not like assistance as he thinks it increases his pain.  Pt will have 24 hour assist at home initially.  Pt is eager to go home. No further OT needs.    Follow Up Recommendations  No OT follow up    Equipment Recommendations  None recommended by OT    Recommendations for Other Services       Precautions / Restrictions Precautions Precautions: Fall Restrictions Weight Bearing Restrictions: No      Mobility Bed Mobility     Rolling: Supervision   Supine to sit: Supervision     General bed mobility comments: used log roll technique to minimize pain, HOB flat, used rail  Transfers Overall transfer level: Needs assistance   Transfers: Sit to/from Stand Sit to Stand: Supervision              Balance     Sitting balance-Leahy Scale: Fair       Standing balance-Leahy Scale: Fair                              ADL Overall ADL's : Needs assistance/impaired Eating/Feeding: Independent;Sitting   Grooming: Wash/dry hands;Supervision/safety;Standing   Upper Body Bathing: Set up;Sitting   Lower Body Bathing: Minimal assistance;Sit to/from stand Lower Body Bathing Details (indicate cue type and reason): recommended pt use his long handled sponge Upper Body Dressing : Set up;Sitting   Lower Body Dressing: Minimal assistance;Sit to/from stand Lower Body Dressing Details  (indicate cue type and reason): pt knowledgeable in use of reacher and sock aide Toilet Transfer: Supervision/safety;Ambulation   Toileting- Clothing Manipulation and Hygiene: Supervision/safety;Sit to/from stand       Functional mobility during ADLs: Supervision/safety       Vision     Perception     Praxis      Pertinent Vitals/Pain Pain Assessment: Faces Faces Pain Scale: Hurts a little bit Pain Location: abdomen Pain Descriptors / Indicators: Grimacing;Operative site guarding Pain Intervention(s): Limited activity within patient's tolerance;Monitored during session;Repositioned     Hand Dominance Right   Extremity/Trunk Assessment Upper Extremity Assessment Upper Extremity Assessment: Overall WFL for tasks assessed   Lower Extremity Assessment Lower Extremity Assessment: Defer to PT evaluation       Communication Communication Communication: No difficulties   Cognition Arousal/Alertness: Awake/alert Behavior During Therapy: WFL for tasks assessed/performed Overall Cognitive Status: Within Functional Limits for tasks assessed                 General Comments: pt prefers to control session   General Comments       Exercises       Shoulder Instructions      Home Living Family/patient expects to be discharged to:: Private residence Living Arrangements: Other (Comment) (step daughter) Available Help at Discharge: Family;Available 24 hours/day (per pt, family has worked out 24 hour care) Type of Home: Mobile home Home  Access: Stairs to enter [employer] of Steps: 2 Entrance Stairs-Rails: Can reach both Home Layout: One level     Bathroom Shower/Tub: Tub/shower unit;Curtain   Firefighter: Standard     Home Equipment: Environmental consultant - 2 wheels;Grab bars - tub/shower;Wheelchair - manual;Cane - single point;Other (comment);Bedside commode;Tub bench;Adaptive equipment Adaptive Equipment: Reacher;Sock aid;Long-handled shoe horn;Long-handled  sponge        Prior Functioning/Environment Level of Independence: Independent             OT Diagnosis: Generalized weakness;Acute pain   OT Problem List:     OT Treatment/Interventions:      OT Goals(Current goals can be found in the care plan section) Acute Rehab OT Goals Patient Stated Goal: get back home  OT Frequency:     Barriers to D/C:            Co-evaluation              End of Session    Activity Tolerance: Patient tolerated treatment well Patient left: in bed;with call bell/phone within reach;with bed alarm set   Time: 4098-1191 OT Time Calculation (min): 18 min Charges:  OT General Charges $OT Visit: 1 Procedure OT Evaluation $OT Eval Low Complexity: 1 Procedure G-Codes:    Evern Bio 01/07/2016, 9:50 AM  [phone]

## 2016-01-07 NOTE — Telephone Encounter (Signed)
LM for pt re appt, dpm °

## 2016-01-07 NOTE — Telephone Encounter (Signed)
-----   Message from Dara Lords, [place] sent at 01/06/2016  9:00 AM EST ----- S/p aorto bi iliac bypass graft.  F/u with Dr. [name] in 2 weeks.  Thanks, Lelon Mast

## 2016-01-11 NOTE — Discharge Summary (Signed)
[facility] Discharge Summary  [name] [name] November 08, 1941 75 y.o. male  [id number]  Admission Date: 01/03/2016  Discharge Date: 01/07/2016  Physician: Gretta Began, MD  Admission Diagnosis: Abdominal aortic aneurysm I71.4  HPI:   This is a 75 y.o. male who was ollowed for a number of years with infrarenal abdominal aortic aneurysm. I'd seen him one month ago with CT scan showing further expansion to 5.1 cm. We discussed continued observation versus elective repair. He is insistent on elective repair. He is quite anxious regarding risk for rupture. Since my last visit he saw pulmonary and cardiology. I have reviewed evaluation from both of these. He is felt to be an acceptable risk for surgery either stent graft or open. He has a well preserved ejection fraction.  Hospital Course:  The patient was admitted to the hospital and taken to the operating room on 01/03/2016 and underwent: Aortic and iliac aneurysm repair with a 22 x 11 [place] aorta to bilateral common iliac artery bypass.    The patient tolerated the procedure well and was transported to the PACU in stable condition.   POD 1: The patient was comfortable. His abdomen was soft with mild tenderness. He had palpable 1+ dorsalis pedis pulses bilaterally. He had some mild shortness of breath. He was given 20 mg of lasix. His IVF were decreased.   POD 2: He continued to have some mild shortness of breath and wheezing and was gently diuresed. He was passing flatus and had a small BM. He denied any nausea or vomiting. He was started on clear liquids. This was advanced later that evening. His creatinine was stable with good urine output. He had palpable pedal pulses bilaterally. He was restarted on po meds. He was transferred to the stepdown unit given respiratory issues.   POD 3: He was more comfortable from a respiratory standpoint with no further wheezing. He was tolerating a regular diet well and had a  bowel movement. He was transferred to the floor.   POD 4: He continued to tolerate a regular diet well. He was comfortable from a respiratory standpoint. He was ambulating adequately. His incision was clean and intact He was discharged home on POD 4 in good condition.   CBC    Component Value Date/Time   WBC 15.7* 01/05/2016 0400   RBC 4.67 01/05/2016 0400   HGB 13.2 01/05/2016 0400   HCT 43.1 01/05/2016 0400   PLT 156 01/05/2016 0400   MCV 92.3 01/05/2016 0400   MCH 28.3 01/05/2016 0400   MCHC 30.6 01/05/2016 0400   RDW 16.2* 01/05/2016 0400   LYMPHSABS 2.1 10/24/2015 1955   MONOABS 0.4 10/24/2015 1955   EOSABS 0.2 10/24/2015 1955   BASOSABS 0.0 10/24/2015 1955    BMET    Component Value Date/Time   NA 142 01/06/2016 0501   K 3.8 01/06/2016 0501   CL 108 01/06/2016 0501   CO2 29 01/06/2016 0501   GLUCOSE 100* 01/06/2016 0501   BUN 26* 01/06/2016 0501   CREATININE 1.13 01/06/2016 0501   CREATININE 1.24 02/05/2015 1215   CALCIUM 7.5* 01/06/2016 0501   GFRNONAA >60 01/06/2016 0501   GFRNONAA 58* 02/05/2015 1215   GFRAA >60 01/06/2016 0501   GFRAA 67 02/05/2015 1215     Discharge Instructions:   The patient is discharged to home with extensive instructions on wound care and progressive ambulation. They are instructed not to drive or perform any heavy lifting until returning to see the physician in his  office.  Discharge Instructions    ABDOMINAL PROCEDURE/ANEURYSM REPAIR/AORTO-BIFEMORAL BYPASS:  Call MD for increased abdominal pain; cramping diarrhea; nausea/vomiting    Complete by:  As directed      Call MD for:  redness, tenderness, or signs of infection (pain, swelling, bleeding, redness, odor or green/yellow discharge around incision site)    Complete by:  As directed      Call MD for:  severe or increased pain, loss or decreased feeling  in affected limb(s)    Complete by:  As directed      Call MD for:  temperature >100.5    Complete by:  As directed       Discharge wound care:    Complete by:  As directed   Shower daily with soap and water starting 01/07/16     Driving Restrictions    Complete by:  As directed   No driving for 2 weeks     Lifting restrictions    Complete by:  As directed   No lifting for 4 weeks     Resume previous diet    Complete by:  As directed            Discharge Diagnosis:  Abdominal aortic aneurysm I71.4  Secondary Diagnosis: Patient Active Problem List   Diagnosis Date Noted  . AAA (abdominal aortic aneurysm) (HCC) 01/03/2016  . Preop cardiovascular exam 11/18/2015  . Carpal tunnel syndrome 09/09/2015  . Acute respiratory failure with hypoxia (HCC) 03/04/2015  . CKD (chronic kidney disease), stage II 03/04/2015  . HLD (hyperlipidemia) 03/01/2015  . BPPV (benign paroxysmal positional vertigo)   . Depression   . CVA (cerebral infarction) 12/24/2014  . Renal insufficiency 12/24/2014  . Stroke (HCC) 12/23/2014  . Cerebral embolism with cerebral infarction (HCC) 01/13/2013  . Automatic implantable cardioverter-defibrillator in situ 08/26/2012  . Chronic systolic heart failure (HCC) 06/28/2012  . Dizziness 06/24/2012  . Claudication (HCC) 04/15/2012  . Dyspnea 01/26/2012  . Abdominal aortic aneurysm (HCC) 11/30/2011  . COPD (chronic obstructive pulmonary disease) (HCC) 07/24/2011  . Long term current use of anticoagulant 02/21/2011  . CORONARY ATHEROSCLEROSIS NATIVE CORONARY ARTERY 10/12/2009  . ILIAC ARTERY ANEURYSM 10/12/2009  . COMPUTERIZED TOMOGRAPHY, CHEST, ABNORMAL 10/12/2009  . HYPERCHOLESTEROLEMIA, PURE 10/18/2008  . HYPERTENSION, BENIGN 10/18/2008  . Cardiomyopathy, ischemic 10/18/2008  . 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 Atrial fibrillation (HCC) 10/18/2008  . CAROTID ARTERY STENOSIS, WITHOUT INFARCTION 10/18/2008   Past Medical History  Diagnosis Date  . Atrial fibrillation (HCC)     s/p prior DCCV;  Amiodarone Rx.  . CAD (coronary artery disease)     s/p CABG;  cath 10/07:  LM  ok, LAD occluded, AV CFX 95%, pRCA occluded; L-LAD, S-OM2/OM3, S-PDA ok  . Cerebrovascular disease     s/p prior Left CEA;  followed by Dr. [name]  . COPD (chronic obstructive pulmonary disease) (HCC)   . Hyperlipidemia   . Hypertension   . Mitral insufficiency     hx of. not noted on echo 01/2012  . Nephrolithiasis   . Ischemic cardiomyopathy     echo 5/09: EF 55%, mild LAE;  Myoview 11/10: inf and apical scar with mild peri-infarct ischemia, EF 44%. Echo 01/26/12 with EF 20-25% but previously known to be 30% on echoes before last one  . Iliac artery aneurysm, left (HCC)     followed by Dr. [name]  . Aortic dissection (HCC)     H/O focal aortic dissection  . LBBB (left bundle branch block)   .  CHF (congestive heart failure) (HCC)   . Myocardial infarction (HCC)   . Renal insufficiency     01/2012  . Stroke (HCC)   . CVA (cerebral infarction)   . DVT (deep venous thrombosis) (HCC)   . Fall from slipping on wet surface Apr 15, 2015    Fx 3 Vertabrea  . AICD (automatic cardioverter/defibrillator) present   . Presence of permanent cardiac pacemaker   . Shortness of breath dyspnea        Medication List    STOP taking these medications        ibuprofen 200 MG tablet  Commonly known as:  ADVIL,MOTRIN      TAKE these medications        albuterol 108 (90 Base) MCG/ACT inhaler  Commonly known as:  PROVENTIL HFA;VENTOLIN HFA  Inhale 2 puffs into the lungs every 4 (four) hours as needed for wheezing.     amiodarone 200 MG tablet  Commonly known as:  PACERONE  Take 200 mg by mouth daily.     amLODipine 10 MG tablet  Commonly known as:  NORVASC  Take 10 mg by mouth daily.     citalopram 10 MG tablet  Commonly known as:  CELEXA  Take 10 mg by mouth daily.     dabigatran 150 MG Caps capsule  Commonly known as:  PRADAXA  Take 1 capsule (150 mg total) by mouth 2 (two) times daily.     furosemide 20 MG tablet  Commonly known as:  LASIX  Take 2 tablets (40 mg total) by mouth  as needed for edema.     hydrALAZINE 50 MG tablet  Commonly known as:  APRESOLINE  Take 0.5 tablets (25 mg total) by mouth 3 (three) times daily.     ipratropium-albuterol 0.5-2.5 (3) MG/3ML Soln  Commonly known as:  DUONEB  Take 3 mLs by nebulization every 4 (four) hours as needed (for shortness of breath).     lisinopril 40 MG tablet  Commonly known as:  PRINIVIL,ZESTRIL  TAKE 1 TABLET EVERY DAY     metoprolol tartrate 25 MG tablet  Commonly known as:  LOPRESSOR  Take 12.5 mg by mouth 2 (two) times daily.     multivitamin with minerals Tabs tablet  Take 1 tablet by mouth daily.     niacin 500 MG CR tablet  Commonly known as:  NIASPAN  Take 1,000 mg by mouth 2 (two) times daily.     potassium chloride SA 20 MEQ tablet  Commonly known as:  K-DUR,KLOR-CON  TAKE 2 TABLETS THREE TIMES DAILY     pravastatin 80 MG tablet  Commonly known as:  PRAVACHOL  Take 1 tablet (80 mg total) by mouth daily.     tiotropium 18 MCG inhalation capsule  Commonly known as:  SPIRIVA HANDIHALER  Place 1 capsule (18 mcg total) into inhaler and inhale daily.     traMADol 50 MG tablet  Commonly known as:  ULTRAM  Take 1 tablet (50 mg total) by mouth every 6 (six) hours as needed for moderate pain.         Tramadol #30 No Refill  Disposition: Home  Patient's condition: is Good  Follow up: 1. Dr. [name] in 2 weeks   Maris Berger, PA-C [facility] [phone] 01/11/2016  12:07 PM   - For VQI Registry use ---   Post-op:  Time to Extubation: [x ] In OR, [ ]  < 12 hrs, [ ]  12-24 hrs, [ ]  >=24 hrs  Vasopressors Req. Post-op: No ICU Stay: 1 days Transfusion: No  MI: No, [ ]  Troponin only, [ ]  EKG or Clinical New Arrhythmia: No   Complications: CHF: No Resp failure: No, [ ]  Pneumonia, [ ]  Ventilator Chg in renal function: No, [ ]  Inc. Cr > 0.5, [ ]  Temp. Dialysis, [ ]  Permanent dialysis Leg ischemia: No, no Surgery needed, [ ]  Yes, Surgery needed, [ ]   Amputation Bowel ischemia: No, [ ]  Medical Rx, [ ]  Surgical Rx Wound complication: No, [ ]  Superficial separation/infection, [ ]  Return to OR Return to OR: No  Return to OR for bleeding: No Stroke: No, [ ]  Minor, [ ]  Major  Discharge medications: Statin use:  Yes If No: [ ]  For Medical reasons, [ ]  Non-compliant ASA use:  No  If No: [ x] For Medical reasons, [ ]  Non-compliant, on pradaxa  Plavix use:  No If No: [ x] For Medical reasons, [ ]  Non-compliant Beta blocker use:  Yes If No: [ ]  For Medical reasons, [ ]  Non-compliant

## 2016-01-18 ENCOUNTER — Encounter: Payer: Self-pay | Admitting: Vascular Surgery

## 2016-01-21 ENCOUNTER — Encounter (HOSPITAL_COMMUNITY): Payer: Self-pay | Admitting: *Deleted

## 2016-01-21 ENCOUNTER — Inpatient Hospital Stay (HOSPITAL_COMMUNITY)
Admission: EM | Admit: 2016-01-21 | Discharge: 2016-01-22 | DRG: 369 | Disposition: A | Discharge status: Home / Self Care | Payer: Medicare Other | Attending: Internal Medicine | Admitting: Internal Medicine

## 2016-01-21 ENCOUNTER — Emergency Department (HOSPITAL_COMMUNITY): Payer: Medicare Other

## 2016-01-21 DIAGNOSIS — I714 Abdominal aortic aneurysm, without rupture: Secondary | ICD-10-CM | POA: Diagnosis present

## 2016-01-21 DIAGNOSIS — Z86718 Personal history of other venous thrombosis and embolism: Secondary | ICD-10-CM | POA: Diagnosis not present

## 2016-01-21 DIAGNOSIS — I447 Left bundle-branch block, unspecified: Secondary | ICD-10-CM | POA: Diagnosis present

## 2016-01-21 DIAGNOSIS — E785 Hyperlipidemia, unspecified: Secondary | ICD-10-CM | POA: Diagnosis present

## 2016-01-21 DIAGNOSIS — Z95810 Presence of automatic (implantable) cardiac defibrillator: Secondary | ICD-10-CM | POA: Diagnosis present

## 2016-01-21 DIAGNOSIS — I5022 Chronic systolic (congestive) heart failure: Secondary | ICD-10-CM | POA: Diagnosis not present

## 2016-01-21 DIAGNOSIS — K226 Gastro-esophageal laceration-hemorrhage syndrome: Principal | ICD-10-CM | POA: Diagnosis present

## 2016-01-21 DIAGNOSIS — Z79899 Other long term (current) drug therapy: Secondary | ICD-10-CM

## 2016-01-21 DIAGNOSIS — Z951 Presence of aortocoronary bypass graft: Secondary | ICD-10-CM | POA: Diagnosis not present

## 2016-01-21 DIAGNOSIS — Z7901 Long term (current) use of anticoagulants: Secondary | ICD-10-CM | POA: Diagnosis not present

## 2016-01-21 DIAGNOSIS — D500 Iron deficiency anemia secondary to blood loss (chronic): Secondary | ICD-10-CM | POA: Diagnosis present

## 2016-01-21 DIAGNOSIS — I481 Persistent atrial fibrillation: Secondary | ICD-10-CM | POA: Diagnosis present

## 2016-01-21 DIAGNOSIS — J449 Chronic obstructive pulmonary disease, unspecified: Secondary | ICD-10-CM | POA: Diagnosis present

## 2016-01-21 DIAGNOSIS — I4821 Permanent atrial fibrillation: Secondary | ICD-10-CM | POA: Diagnosis present

## 2016-01-21 DIAGNOSIS — I482 Chronic atrial fibrillation: Secondary | ICD-10-CM | POA: Diagnosis present

## 2016-01-21 DIAGNOSIS — I252 Old myocardial infarction: Secondary | ICD-10-CM

## 2016-01-21 DIAGNOSIS — R110 Nausea: Secondary | ICD-10-CM

## 2016-01-21 DIAGNOSIS — I110 Hypertensive heart disease with heart failure: Secondary | ICD-10-CM | POA: Diagnosis present

## 2016-01-21 DIAGNOSIS — K922 Gastrointestinal hemorrhage, unspecified: Secondary | ICD-10-CM | POA: Diagnosis not present

## 2016-01-21 DIAGNOSIS — I5042 Chronic combined systolic (congestive) and diastolic (congestive) heart failure: Secondary | ICD-10-CM | POA: Diagnosis present

## 2016-01-21 DIAGNOSIS — I255 Ischemic cardiomyopathy: Secondary | ICD-10-CM | POA: Diagnosis present

## 2016-01-21 DIAGNOSIS — Z8673 Personal history of transient ischemic attack (TIA), and cerebral infarction without residual deficits: Secondary | ICD-10-CM | POA: Diagnosis not present

## 2016-01-21 DIAGNOSIS — K920 Hematemesis: Secondary | ICD-10-CM

## 2016-01-21 DIAGNOSIS — I2510 Atherosclerotic heart disease of native coronary artery without angina pectoris: Secondary | ICD-10-CM | POA: Diagnosis present

## 2016-01-21 DIAGNOSIS — Z7951 Long term (current) use of inhaled steroids: Secondary | ICD-10-CM | POA: Diagnosis not present

## 2016-01-21 DIAGNOSIS — I248 Other forms of acute ischemic heart disease: Secondary | ICD-10-CM | POA: Diagnosis present

## 2016-01-21 DIAGNOSIS — E7800 Pure hypercholesterolemia, unspecified: Secondary | ICD-10-CM | POA: Diagnosis present

## 2016-01-21 DIAGNOSIS — A084 Viral intestinal infection, unspecified: Secondary | ICD-10-CM

## 2016-01-21 DIAGNOSIS — R1112 Projectile vomiting: Secondary | ICD-10-CM | POA: Diagnosis not present

## 2016-01-21 DIAGNOSIS — I10 Essential (primary) hypertension: Secondary | ICD-10-CM | POA: Diagnosis not present

## 2016-01-21 LAB — COMPREHENSIVE METABOLIC PANEL
ALBUMIN: 2.8 g/dL — AB (ref 3.5–5.0)
ALT: 14 U/L — ABNORMAL LOW (ref 17–63)
AST: 20 U/L (ref 15–41)
Alkaline Phosphatase: 64 U/L (ref 38–126)
Anion gap: 11 (ref 5–15)
BILIRUBIN TOTAL: 1.2 mg/dL (ref 0.3–1.2)
BUN: 12 mg/dL (ref 6–20)
CO2: 24 mmol/L (ref 22–32)
Calcium: 8.5 mg/dL — ABNORMAL LOW (ref 8.9–10.3)
Chloride: 107 mmol/L (ref 101–111)
Creatinine, Ser: 1.09 mg/dL (ref 0.61–1.24)
GFR calc Af Amer: >60 mL/min (ref >60)
GFR calc non Af Amer: >60 mL/min (ref >60)
GLUCOSE: 114 mg/dL — AB (ref 65–99)
POTASSIUM: 3.6 mmol/L (ref 3.5–5.1)
Sodium: 142 mmol/L (ref 135–145)
TOTAL PROTEIN: 5.7 g/dL — AB (ref 6.5–8.1)

## 2016-01-21 LAB — CBC
HEMATOCRIT: 40.3 % (ref 39.0–52.0)
Hemoglobin: 12.8 g/dL — ABNORMAL LOW (ref 13.0–17.0)
MCH: 28 pg (ref 26.0–34.0)
MCHC: 31.8 g/dL (ref 30.0–36.0)
MCV: 88.2 fL (ref 78.0–100.0)
Platelets: 223 10*3/uL (ref 150–400)
RBC: 4.57 MIL/uL (ref 4.22–5.81)
RDW: 16.5 % — AB (ref 11.5–15.5)
WBC: 5.8 10*3/uL (ref 4.0–10.5)

## 2016-01-21 LAB — MRSA PCR SCREENING: MRSA by PCR: POSITIVE — AB

## 2016-01-21 LAB — HEMOGLOBIN AND HEMATOCRIT, BLOOD
HCT: 41.3 % (ref 39.0–52.0)
HEMOGLOBIN: 13.2 g/dL (ref 13.0–17.0)

## 2016-01-21 LAB — URINALYSIS, ROUTINE W REFLEX MICROSCOPIC
BILIRUBIN URINE: NEGATIVE
GLUCOSE, UA: NEGATIVE mg/dL
HGB URINE DIPSTICK: NEGATIVE
KETONES UR: NEGATIVE mg/dL
Leukocytes, UA: NEGATIVE
NITRITE: NEGATIVE
PH: 6.5 (ref 5.0–8.0)
Protein, ur: NEGATIVE mg/dL
Specific Gravity, Urine: >1.046 — ABNORMAL HIGH (ref 1.005–1.030)

## 2016-01-21 LAB — DIFFERENTIAL
BASOS PCT: 1 %
Basophils Absolute: 0 10*3/uL (ref 0.0–0.1)
EOS ABS: 0.1 10*3/uL (ref 0.0–0.7)
Eosinophils Relative: 2 %
Lymphocytes Relative: 18 %
Lymphs Abs: 1 10*3/uL (ref 0.7–4.0)
MONO ABS: 0.5 10*3/uL (ref 0.1–1.0)
MONOS PCT: 8 %
Neutro Abs: 4 10*3/uL (ref 1.7–7.7)
Neutrophils Relative %: 71 %

## 2016-01-21 LAB — LIPASE, BLOOD: Lipase: 28 U/L (ref 11–51)

## 2016-01-21 LAB — TROPONIN I
Troponin I: 0.2 ng/mL — ABNORMAL HIGH (ref <0.031)
Troponin I: 0.21 ng/mL — ABNORMAL HIGH (ref <0.031)
Troponin I: 0.18 ng/mL — ABNORMAL HIGH (ref <0.031)

## 2016-01-21 LAB — POC OCCULT BLOOD, ED: Fecal Occult Bld: NEGATIVE

## 2016-01-21 MED ORDER — PANTOPRAZOLE SODIUM 40 MG IV SOLR: 40.0000 mg | Freq: Two times a day (BID) | INTRAVENOUS | Status: DC

## 2016-01-21 MED ORDER — TRAMADOL HCL 50 MG PO TABS: 50.0000 mg | ORAL_TABLET | Freq: Four times a day (QID) | ORAL | Status: DC | PRN

## 2016-01-21 MED ORDER — ONDANSETRON HCL 4 MG PO TABS: 4.0000 mg | ORAL_TABLET | Freq: Four times a day (QID) | ORAL | Status: DC | PRN

## 2016-01-21 MED ORDER — IPRATROPIUM-ALBUTEROL 0.5-2.5 (3) MG/3ML IN SOLN
3.0000 mL | RESPIRATORY_TRACT | Status: DC | PRN
  Administered 2016-01-22: 3 mL via RESPIRATORY_TRACT
  Filled 2016-01-21: qty 3

## 2016-01-21 MED ORDER — METOPROLOL TARTRATE 12.5 MG HALF TABLET
12.5000 mg | ORAL_TABLET | Freq: Two times a day (BID) | ORAL | Status: DC
  Administered 2016-01-21 – 2016-01-22 (×2): 12.5 mg via ORAL
  Filled 2016-01-21 (×2): qty 1

## 2016-01-21 MED ORDER — ACETAMINOPHEN 325 MG PO TABS: 650.0000 mg | ORAL_TABLET | Freq: Four times a day (QID) | ORAL | Status: DC | PRN

## 2016-01-21 MED ORDER — ONDANSETRON HCL 4 MG/2ML IJ SOLN: 4.0000 mg | Freq: Four times a day (QID) | INTRAMUSCULAR | Status: DC | PRN

## 2016-01-21 MED ORDER — NIACIN ER 500 MG PO CPCR
1000.0000 mg | ORAL_CAPSULE | Freq: Two times a day (BID) | ORAL | Status: DC
  Administered 2016-01-21 – 2016-01-22 (×2): 1000 mg via ORAL
  Filled 2016-01-21 (×3): qty 2

## 2016-01-21 MED ORDER — ONDANSETRON HCL 4 MG/2ML IJ SOLN
4.0000 mg | Freq: Once | INTRAMUSCULAR | Status: AC | PRN
  Administered 2016-01-21: 4 mg via INTRAVENOUS
  Filled 2016-01-21: qty 2

## 2016-01-21 MED ORDER — HYDRALAZINE HCL 25 MG PO TABS
25.0000 mg | ORAL_TABLET | Freq: Three times a day (TID) | ORAL | Status: DC
  Administered 2016-01-21 – 2016-01-22 (×2): 25 mg via ORAL
  Filled 2016-01-21 (×3): qty 1

## 2016-01-21 MED ORDER — SODIUM CHLORIDE 0.9 % IV SOLN
8.0000 mg/h | INTRAVENOUS | Status: DC
  Administered 2016-01-21: 8 mg/h via INTRAVENOUS
  Filled 2016-01-21 (×2): qty 80

## 2016-01-21 MED ORDER — ADULT MULTIVITAMIN W/MINERALS CH
1.0000 | ORAL_TABLET | Freq: Every day | ORAL | Status: DC
  Administered 2016-01-22: 1 via ORAL
  Filled 2016-01-21: qty 1

## 2016-01-21 MED ORDER — ACETAMINOPHEN 650 MG RE SUPP: 650.0000 mg | Freq: Four times a day (QID) | RECTAL | Status: DC | PRN

## 2016-01-21 MED ORDER — SODIUM CHLORIDE 0.9 % IV SOLN
INTRAVENOUS | Status: DC
  Administered 2016-01-21: 19:00:00 via INTRAVENOUS

## 2016-01-21 MED ORDER — PANTOPRAZOLE SODIUM 40 MG IV SOLR
40.0000 mg | Freq: Two times a day (BID) | INTRAVENOUS | Status: DC
  Administered 2016-01-21 – 2016-01-22 (×2): 40 mg via INTRAVENOUS
  Filled 2016-01-21 (×2): qty 40

## 2016-01-21 MED ORDER — ONDANSETRON HCL 4 MG/2ML IJ SOLN
4.0000 mg | Freq: Once | INTRAMUSCULAR | Status: AC
  Administered 2016-01-21: 4 mg via INTRAVENOUS
  Filled 2016-01-21: qty 2

## 2016-01-21 MED ORDER — CITALOPRAM HYDROBROMIDE 10 MG PO TABS
10.0000 mg | ORAL_TABLET | Freq: Every day | ORAL | Status: DC
  Administered 2016-01-22: 10 mg via ORAL
  Filled 2016-01-21: qty 1

## 2016-01-21 MED ORDER — PANTOPRAZOLE SODIUM 40 MG IV SOLR: 40.0000 mg | INTRAVENOUS | Status: DC

## 2016-01-21 MED ORDER — SODIUM CHLORIDE 0.9 % IV SOLN
80.0000 mg | Freq: Once | INTRAVENOUS | Status: AC
  Administered 2016-01-21: 80 mg via INTRAVENOUS
  Filled 2016-01-21: qty 80

## 2016-01-21 MED ORDER — IOHEXOL 300 MG/ML  SOLN
100.0000 mL | Freq: Once | INTRAMUSCULAR | Status: AC | PRN
  Administered 2016-01-21: 100 mL via INTRAVENOUS

## 2016-01-21 MED ORDER — ALBUTEROL SULFATE (2.5 MG/3ML) 0.083% IN NEBU
2.5000 mg | INHALATION_SOLUTION | RESPIRATORY_TRACT | Status: DC | PRN
Start: 2016-01-21 — End: 2016-01-22

## 2016-01-21 MED ORDER — PRAVASTATIN SODIUM 40 MG PO TABS
80.0000 mg | ORAL_TABLET | Freq: Every day | ORAL | Status: DC
  Administered 2016-01-22: 80 mg via ORAL
  Filled 2016-01-21: qty 2

## 2016-01-21 MED ORDER — TIOTROPIUM BROMIDE MONOHYDRATE 18 MCG IN CAPS
18.0000 ug | ORAL_CAPSULE | Freq: Every day | RESPIRATORY_TRACT | Status: DC
  Administered 2016-01-21 – 2016-01-22 (×2): 18 ug via RESPIRATORY_TRACT
  Filled 2016-01-21 (×2): qty 5

## 2016-01-21 NOTE — ED Notes (Signed)
Report attempted 

## 2016-01-21 NOTE — ED Provider Notes (Signed)
CSN: [id number]     Arrival date & time 01/21/16  1023 History   First MD Initiated Contact with Patient 01/21/16 1056     Chief Complaint  Patient presents with  . Emesis     (Consider location/radiation/quality/duration/timing/severity/associated sxs/prior Treatment) HPI  75 year old male who presents with nausea and vomiting. History of CAD with ischemic cardiomyopathy status post AICD, prior CVA, atrial fibrillation on pradaxa, and infrarenal abdominal aortic aneurysm status post repair with aortic to common iliac bypass on 01/03/2016. States that he has been recovering well from his surgery, but developed 9-10 episodes of nonbilious nonbloody emesis starting yesterday evening. Has not had a bowel movement in the past 4 days, and feels that he has significantly decreased passage of gas. Has not noted significant abdominal distention or abdominal pain. Denies any fevers, chills, dysuria or frequency, difficulty breathing, chest pain, syncope or near syncope. Denies any melena or hematochezia.  Past Medical History  Diagnosis Date  . Atrial fibrillation (HCC)     s/p prior DCCV;  Amiodarone Rx.  . CAD (coronary artery disease)     s/p CABG;  cath 10/07:  LM ok, LAD occluded, AV CFX 95%, pRCA occluded; L-LAD, S-OM2/OM3, S-PDA ok  . Cerebrovascular disease     s/p prior Left CEA;  followed by Dr. [name]  . COPD (chronic obstructive pulmonary disease) (HCC)   . Hyperlipidemia   . Hypertension   . Mitral insufficiency     hx of. not noted on echo 01/2012  . Nephrolithiasis   . Ischemic cardiomyopathy     echo 5/09: EF 55%, mild LAE;  Myoview 11/10: inf and apical scar with mild peri-infarct ischemia, EF 44%. Echo 01/26/12 with EF 20-25% but previously known to be 30% on echoes before last one  . Iliac artery aneurysm, left (HCC)     followed by Dr. [name]  . Aortic dissection (HCC)     H/O focal aortic dissection  . LBBB (left bundle branch block)   . CHF (congestive heart failure) (HCC)    . Myocardial infarction (HCC)   . Renal insufficiency     01/2012  . Stroke (HCC)   . CVA (cerebral infarction)   . DVT (deep venous thrombosis) (HCC)   . Fall from slipping on wet surface Apr 15, 2015    Fx 3 Vertabrea  . AICD (automatic cardioverter/defibrillator) present   . Presence of permanent cardiac pacemaker   . Shortness of breath dyspnea    Past Surgical History  Procedure Laterality Date  . Uteroscopy    . Coronary artery bypass graft  1998  . Appendectomy  09/11/11  . Biventricular defibrillator implantation  08/22/12    SJM Quadra Assura BiV ICD implanted by Dr [name]  . Spine surgery  Sep 27, 2014    Lower Back L 4  and   L 5  . Bi-ventricular implantable cardioverter defibrillator N/A 08/22/2012    Procedure: BI-VENTRICULAR IMPLANTABLE CARDIOVERTER DEFIBRILLATOR  (CRT-D);  Surgeon: [name], MD;  Location: [facility] CATH LAB;  Service: Cardiovascular;  Laterality: N/A;  . Carotid endarterectomy  July  2005    left  . Cardiac catheterization  2013  . Abdominal aortic aneurysm repair N/A 01/03/2016    Procedure: ANEURYSM ABDOMINAL AORTA, BILATERAL COMMON ILIAC REPAIR;  Surgeon: [name], MD;  Location: [facility] OR;  Service: Vascular;  Laterality: N/A;   Family History  Problem Relation Age of Onset  . Stroke Maternal Uncle    Social History  Substance Use Topics  .  Smoking status: Former Smoker -- 1.50 packs/day for 57 years    Types: Cigarettes    Quit date: 12/11/2004  . Smokeless tobacco: Never Used  . Alcohol Use: No     Comment: 6 yrs ago     Review of Systems 10/14 systems reviewed and are negative other than those stated in the HPI    Allergies  Morphine and related  Home Medications   Prior to Admission medications   Medication Sig Start Date End Date Taking? Authorizing Provider  albuterol (PROVENTIL HFA;VENTOLIN HFA) 108 (90 BASE) MCG/ACT inhaler Inhale 2 puffs into the lungs every 4 (four) hours as needed for wheezing. 03/29/13  Yes Cathren Laine, MD  amiodarone (PACERONE) 200 MG tablet Take 200 mg by mouth daily.   Yes Historical Provider, MD  amLODipine (NORVASC) 10 MG tablet Take 10 mg by mouth daily.   Yes Historical Provider, MD  citalopram (CELEXA) 10 MG tablet Take 10 mg by mouth daily.   Yes Historical Provider, MD  dabigatran (PRADAXA) 150 MG CAPS capsule Take 1 capsule (150 mg total) by mouth 2 (two) times daily. 03/08/15  Yes Lewayne Bunting, MD  furosemide (LASIX) 20 MG tablet Take 2 tablets (40 mg total) by mouth as needed for edema. 12/31/15  Yes Lewayne Bunting, MD  hydrALAZINE (APRESOLINE) 50 MG tablet Take 0.5 tablets (25 mg total) by mouth 3 (three) times daily. 12/15/14  Yes Lewayne Bunting, MD  ipratropium-albuterol (DUONEB) 0.5-2.5 (3) MG/3ML SOLN Take 3 mLs by nebulization every 4 (four) hours as needed (for shortness of breath). 12/01/15  Yes Leslye Peer, MD  lisinopril (PRINIVIL,ZESTRIL) 40 MG tablet TAKE 1 TABLET EVERY DAY 12/29/14  Yes Lewayne Bunting, MD  metoprolol tartrate (LOPRESSOR) 25 MG tablet Take 12.5 mg by mouth 2 (two) times daily.   Yes Historical Provider, MD  Multiple Vitamin (MULITIVITAMIN WITH MINERALS) TABS Take 1 tablet by mouth daily.   Yes Historical Provider, MD  niacin (NIASPAN) 500 MG CR tablet Take 1,000 mg by mouth 2 (two) times daily.    Yes Historical Provider, MD  potassium chloride SA (K-DUR,KLOR-CON) 20 MEQ tablet TAKE 2 TABLETS THREE TIMES DAILY 04/13/15  Yes Lewayne Bunting, MD  pravastatin (PRAVACHOL) 80 MG tablet Take 1 tablet (80 mg total) by mouth daily. 06/15/15  Yes Lewayne Bunting, MD  tiotropium (SPIRIVA HANDIHALER) 18 MCG inhalation capsule Place 1 capsule (18 mcg total) into inhaler and inhale daily. 10/29/15 10/28/16 Yes Leslye Peer, MD  traMADol (ULTRAM) 50 MG tablet Take 1 tablet (50 mg total) by mouth every 6 (six) hours as needed for moderate pain. 01/07/16  Yes Kimberly A Trinh, PA-C   BP 148/93 mmHg  Pulse 93  Temp(Src) 98.7 F (37.1 C) (Oral)  Resp 18   Ht 6\' 1"  (1.854 m)  Wt 227 lb (102.967 kg)  BMI 29.96 kg/m2  SpO2 98% Physical Exam Physical Exam  Nursing note and vitals reviewed. Constitutional: Elderly man, well nourished, non-toxic, and in no acute distress Head: Normocephalic and atraumatic.  Mouth/Throat: Oropharynx is clear but dry.  Neck: Normal range of motion. Neck supple.  Cardiovascular: Normal rate and regular rhythm.   Pulmonary/Chest: Effort normal and breath sounds normal.  Abdominal: Soft. Obese. No distension. There is no tenderness. There is no rebound and no guarding.  Musculoskeletal: Normal range of motion.  Neurological: Alert, no facial droop, fluent speech, moves all extremities symmetrically Skin: Skin is warm and dry.  Psychiatric: Cooperative  ED Course  Procedures (including critical care time) Labs Review Labs Reviewed  COMPREHENSIVE METABOLIC PANEL - Abnormal; Notable for the following:    Glucose, Bld 114 (*)    Calcium 8.5 (*)    Total Protein 5.7 (*)    Albumin 2.8 (*)    ALT 14 (*)    All other components within normal limits  CBC - Abnormal; Notable for the following:    Hemoglobin 12.8 (*)    RDW 16.5 (*)    All other components within normal limits  TROPONIN I - Abnormal; Notable for the following:    Troponin I 0.20 (*)    All other components within normal limits  TROPONIN I - Abnormal; Notable for the following:    Troponin I 0.21 (*)    All other components within normal limits  LIPASE, BLOOD  DIFFERENTIAL  URINALYSIS, ROUTINE W REFLEX MICROSCOPIC (NOT AT [facility])  POC OCCULT BLOOD, ED    Imaging Review Ct Abdomen Pelvis W Contrast  01/21/2016  CLINICAL DATA:  Nausea, vomiting and constipation. Status post repair of an abdominal aortic aneurysm 01/03/2016. Subsequent encounter. EXAM: CT ABDOMEN AND PELVIS WITH CONTRAST TECHNIQUE: Multidetector CT imaging of the abdomen and pelvis was performed using the standard protocol following bolus administration of intravenous contrast.  CONTRAST:  100 mL OMNIPAQUE IOHEXOL 300 MG/ML  SOLN COMPARISON:  CTA abdomen and pelvis 10/19/2015 and 10/08/2009. FINDINGS: There are very small bilateral pleural effusions. No pericardial effusion is identified. Cardiomegaly is noted. Fluid is present in the visualized distal esophagus compatible with reflux and/or poor motility. The patient is status post abdominal aortic aneurysm repair. There is small amount of scattered periaortic fluid and a small amount of fluid anterior to the right common iliac artery. No rim enhancing fluid collection or soft tissue gas is identified. There is no evidence of hemorrhage. Three small hepatic cysts are unchanged. The liver is otherwise unremarkable. The gallbladder, adrenal glands and pancreas appear normal. 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 The spleen otherwise appears normal. The can't trash of the kidneys demonstrate prompt and homogeneous enhancement. No hydronephrosis or focal lesion. The urinary bladder is unremarkable. The prostate gland is mildly prominent. The stomach and small and large bowel appear normal. There is no lymphadenopathy. Bones demonstrate postoperative change of vertebral augmentation from L1-2 L5. No lytic or sclerotic bony lesion is identified. No acute fracture. IMPRESSION: Status post repair of an abdominal aortic aneurysm. 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 A very small amount of simple, scattered fluid in the abdomen is likely related to the patient's recent surgery. No organized fluid collection or hemorrhage is identified. Cardiomegaly. Fluid in the distal esophagus suggestive of reflux and/or poor motility. Very small bilateral pleural effusions. Electronically Signed   By: Drusilla Kanner M.D.   On: 01/21/2016 13:10   I have personally reviewed and evaluated these images and lab results as part of my medical  decision-making.   EKG Interpretation   Date/Time:  Friday January 21 2016 11:55:04 EST Ventricular Rate:  93 PR Interval:    QRS Duration: 157 QT Interval:  457 QTC Calculation: 568 R Axis:   115 Text Interpretation:  Atrial fibrillation Consider left ventricular  hypertrophy Probable lateral infarct, age indeterminate Anterior Q waves,  possibly due to LVH Prolonged QT interval Ventricular paced rhythm, no  significant change from prior EKG Confirmed by Dahiana Kulak MD, [place] (  [id number]) on  01/21/2016 12:20:50 PM      MDM   Final diagnoses:  Hematemesis with nausea    75 year old male with history of ischemic cardiomyopathy, atrial fibrillation on Pradaxa, and aortic aneurysm repair one month ago who presents with intractable nausea and vomiting. Vital signs are stable on arrival, and he has a soft and nontender abdomen. Does have one episode of vomiting here in the ED, that was witnessed to be hematemesis. Guaiac negative from below. CT abdomen pelvis performed showing no complications involving his aortic aneurysm repair. No evidence of obstruction or any other acute intra-abdominal processes. He is a stable hemoglobin, and a normal BUN/creatinine ratio. Is given 80 mg of protonix and started on protonix drip. Troponin performed given extension cardiac history and c/f possible atypical presentation for ACS, and is elevated at 0.20. EKg without acute ischemic changes, and he has no chest pain or dyspnea. Discussed with vascularly surgery, who in the setting of multiple comorbidities recommended medicine admission. GI consulted regarding c/f GI bleed. Admitted to [facility] hospitalist to stepdown for ongoing management.    Lavera Guise, MD 01/21/16 [phone]

## 2016-01-21 NOTE — Consult Note (Signed)
CARDIOLOGY CONSULT NOTE   Patient ID: [name] [name] MRN: [id number] DOB/AGE: 1941/09/06 75 y.o.  Admit date: 01/21/2016  Consulting Physician: Primary Physician   [name], MD Primary Cardiologist  Dr [name] Dr [name] (EP) Reason for Consultation   Elevated trop  HPI: [name] [name] is a 75 y.o. male with a history of HTN, HLD, carotid artery disease s/p CEA, CAD s/p CABG (1998), ischemic CM (EF 40-45%), chronic systolic CHF s/p BiV ICD, LBBB, CVA, COPD, peristant afib on Pradaxa and AAA s/p open infrarenal abdominal aortic aneurysm repair By Dr. Tawanna Cooler Early 01/03/2016 who presented to [facility] with N/V with hemoptysis. His troponin was noted to be elevated at 0.2 and cardiology was consulted.   His last catheterization in August of 2013 showed normal left main, occluded LAD, a small OM 2 with an ostial 95% lesion, 2 moderate size obtuse marginals were occluded. The right coronary was occluded. The LIMA to the LAD was patent. The SVG to the diagonal had a 50% proximal stenosis. The saphenous vein graft to the obtuse marginal was patent. Saphenous vein graft to the right coronary artery had a mid 40% lesion. Ejection fraction was 35%. Patient subsequently had a biventricular ICD implanted in September of 2013. He also has a history of focal aortic dissection being managed medically. Note, he also has a left iliac artery aneurysm and mild cerebrovascular disease with history of carotid endarterectomy. He is followed by vascular surgery (Dr. [name]) for these issues. Had CVA January 2016. Last echocardiogram in January 2016 showed an ejection fraction of 40-45%.there was grade 1 diastolic dysfunction and moderate left atrial enlargement. Recently had his device interrogated and found to be in recurrent atrial fibrillation.  He was last seen by Dr. [name] in 11/2015. A nuclear stress test was ordered for pre op clearance for AAA repair. His recurrent afib was felt to be asymptomatic and  amiodarone was discontinued. He was continued on BB for rate control. Myoview in 11/2015 was intermediate due to severe LV dysfunction (EF 25%) and Inferior scar w/o ischemia consistent with ISCM.   He had a recent admission by vascular surgery s/p aortic and iliac aneurysm repair with a 22 x 11 Hemashield aorta to bilateral common iliac artery bypass by Dr. [name]. He was discharged post op day 4 tolerating regular diet.Yesterday he started feeling nauseated and vomited up his food prompting him to be seen. Per notes, he did throw up some blood but not sure quantity. He has had no chest pain or SOB. No LE edema, orthopnea or PND. No dizziness or syncope or blood in his stool or urine.     Past Medical History  Diagnosis Date  . Atrial fibrillation (HCC)     s/p prior DCCV;  Amiodarone Rx.  . CAD (coronary artery disease)     s/p CABG;  cath 10/07:  LM ok, LAD occluded, AV CFX 95%, pRCA occluded; L-LAD, S-OM2/OM3, S-PDA ok  . Cerebrovascular disease     s/p prior Left CEA;  followed by Dr. [name]  . COPD (chronic obstructive pulmonary disease) (HCC)   . Hyperlipidemia   . Hypertension   . Mitral insufficiency     hx of. not noted on echo 01/2012  . Nephrolithiasis   . Ischemic cardiomyopathy     echo 5/09: EF 55%, mild LAE;  Myoview 11/10: inf and apical scar with mild peri-infarct ischemia, EF 44%. Echo 01/26/12 with EF 20-25% but previously known to be 30% on echoes before  last one  . Iliac artery aneurysm, left (HCC)     followed by Dr. [name]  . Aortic dissection (HCC)     H/O focal aortic dissection  . LBBB (left bundle branch block)   . CHF (congestive heart failure) (HCC)   . Myocardial infarction (HCC)   . Renal insufficiency     01/2012  . Stroke (HCC)   . CVA (cerebral infarction)   . DVT (deep venous thrombosis) (HCC)   . Fall from slipping on wet surface Apr 15, 2015    Fx 3 Vertabrea  . AICD (automatic cardioverter/defibrillator) present   . Presence of permanent cardiac  pacemaker   . Shortness of breath dyspnea      Past Surgical History  Procedure Laterality Date  . Uteroscopy    . Coronary artery bypass graft  1998  . Left cea  07/05  . Appendectomy  09/11/11  . Biventricular defibrillator implantation  08/22/12    SJM Quadra Assura BiV ICD implanted by Dr [name]  . Spine surgery  Sep 27, 2014    Lower Back L 4  and   L 5  . Bi-ventricular implantable cardioverter defibrillator N/A 08/22/2012    Procedure: BI-VENTRICULAR IMPLANTABLE CARDIOVERTER DEFIBRILLATOR  (CRT-D);  Surgeon: [name], MD;  Location: [facility] CATH LAB;  Service: Cardiovascular;  Laterality: N/A;  . Carotid endarterectomy  July  2005    left  . Cardiac catheterization  2013  . Abdominal aortic aneurysm repair N/A 01/03/2016    Procedure: ANEURYSM ABDOMINAL AORTA, BILATERAL COMMON ILIAC REPAIR;  Surgeon: [name], MD;  Location: [facility] OR;  Service: Vascular;  Laterality: N/A;    Allergies  Allergen Reactions  . Morphine And Related Other (See Comments)    Patient stated that he did not do well on this medication. He felt like climbing the walls    I have reviewed the patient's current medications . [START ON 01/25/2016] pantoprazole (PROTONIX) IV  40 mg Intravenous Q12H   . pantoprozole (PROTONIX) infusion       Prior to Admission medications   Medication Sig Start Date End Date Taking? Authorizing Provider  albuterol (PROVENTIL HFA;VENTOLIN HFA) 108 (90 BASE) MCG/ACT inhaler Inhale 2 puffs into the lungs every 4 (four) hours as needed for wheezing. 03/29/13  Yes Cathren Laine, MD  amiodarone (PACERONE) 200 MG tablet Take 200 mg by mouth daily.   Yes Historical Provider, MD  amLODipine (NORVASC) 10 MG tablet Take 10 mg by mouth daily.   Yes Historical Provider, MD  citalopram (CELEXA) 10 MG tablet Take 10 mg by mouth daily.   Yes Historical Provider, MD  dabigatran (PRADAXA) 150 MG CAPS capsule Take 1 capsule (150 mg total) by mouth 2 (two) times daily. 03/08/15  Yes Lewayne Bunting, MD  furosemide (LASIX) 20 MG tablet Take 2 tablets (40 mg total) by mouth as needed for edema. 12/31/15  Yes Lewayne Bunting, MD  hydrALAZINE (APRESOLINE) 50 MG tablet Take 0.5 tablets (25 mg total) by mouth 3 (three) times daily. 12/15/14  Yes Lewayne Bunting, MD  ipratropium-albuterol (DUONEB) 0.5-2.5 (3) MG/3ML SOLN Take 3 mLs by nebulization every 4 (four) hours as needed (for shortness of breath). 12/01/15  Yes Leslye Peer, MD  lisinopril (PRINIVIL,ZESTRIL) 40 MG tablet TAKE 1 TABLET EVERY DAY 12/29/14  Yes Lewayne Bunting, MD  metoprolol tartrate (LOPRESSOR) 25 MG tablet Take 12.5 mg by mouth 2 (two) times daily.   Yes Historical Provider, MD  Multiple Vitamin [facility]  WITH MINERALS) TABS Take 1 tablet by mouth daily.   Yes Historical Provider, MD  niacin (NIASPAN) 500 MG CR tablet Take 1,000 mg by mouth 2 (two) times daily.    Yes Historical Provider, MD  potassium chloride SA (K-DUR,KLOR-CON) 20 MEQ tablet TAKE 2 TABLETS THREE TIMES DAILY 04/13/15  Yes Lewayne Bunting, MD  pravastatin (PRAVACHOL) 80 MG tablet Take 1 tablet (80 mg total) by mouth daily. 06/15/15  Yes Lewayne Bunting, MD  tiotropium (SPIRIVA HANDIHALER) 18 MCG inhalation capsule Place 1 capsule (18 mcg total) into inhaler and inhale daily. 10/29/15 10/28/16 Yes Leslye Peer, MD  traMADol (ULTRAM) 50 MG tablet Take 1 tablet (50 mg total) by mouth every 6 (six) hours as needed for moderate pain. 01/07/16  Yes Raymond Gurney, PA-C     Social History   Social History  . Marital Status: Married    Spouse Name: N/A  . Number of Children: 3  . Years of Education: 10 TH   Occupational History  . RETIRED---truck driver    Social History Main Topics  . Smoking status: Former Smoker -- 1.50 packs/day for 57 years    Types: Cigarettes    Quit date: 12/11/2004  . Smokeless tobacco: Never Used  . Alcohol Use: No     Comment: 6 yrs ago   . Drug Use: No  . Sexual Activity: No   Other Topics Concern  . Not  on file   Social History Narrative   Patient is married with 3 children.   Patient is right handed.   Patient has 10 th grade education.   Patient drinks tea occasionally.    Family Status  Relation Status Death Age  . Mother Deceased 75    car accident  . Father Deceased     car accident   Family History  Problem Relation Age of Onset  . Stroke Maternal Uncle      ROS:  Full 14 point review of systems complete and found to be negative unless listed above.  Physical Exam: Blood pressure 147/106, pulse 88, temperature 98.7 F (37.1 C), temperature source Oral, resp. rate 11, height  (1.854 m), weight 227 lb (102.967 kg), SpO2 98 %.  General: Well developed, well nourished, male in no acute distress Head: Eyes PERRLA, No xanthomas.   Normocephalic and atraumatic, oropharynx without edema or exudate Lungs: diffuse wheezes  Heart: HRRR S1 S2, no rub/gallop, Heart regular rate and rhythm with S1, S2  murmur. pulses are 2+ extrem.   Neck: No carotid bruits. No lymphadenopathy.  No JVD. Abdomen: Bowel sounds present, abdomen soft and non-tender without masses or hernias noted. Msk:  No spine or cva tenderness. No weakness, no joint deformities or effusions. Extremities: No clubbing or cyanosis.  No LE edema.  Neuro: Alert and oriented X 3. No focal deficits noted. Psych:  Good affect, responds appropriately Skin: No rashes or lesions noted.  Labs:   Lab Results  Component Value Date   WBC 5.8 01/21/2016   HGB 12.8* 01/21/2016   HCT 40.3 01/21/2016   MCV 88.2 01/21/2016   PLT 223 01/21/2016   No results for input(s): INR in the last 72 hours.  Recent Labs Lab 01/21/16 1043  NA 142  K 3.6  CL 107  CO2 24  BUN 12  CREATININE 1.09  CALCIUM 8.5*  PROT 5.7*  BILITOT 1.2  ALKPHOS 64  ALT 14*  AST 20  GLUCOSE 114*  ALBUMIN 2.8*   MAGNESIUM  Date Value Ref Range Status  01/04/2016 2.2 1.7 - 2.4 mg/dL Final    Recent Labs  83/09/40 1043  TROPONINI 0.20*     No results for input(s): TROPIPOC in the last 72 hours. PRO B NATRIURETIC PEPTIDE (BNP)  Date/Time Value Ref Range Status  09/19/2013 01:26 PM 138.0* 0.0 - 100.0 pg/mL Final  03/29/2013 03:30 PM 1154.0* 0 - 125 pg/mL Final   Lab Results  Component Value Date   CHOL 144 12/24/2014   HDL 38* 12/24/2014   LDLCALC 75 12/24/2014   TRIG 154* 12/24/2014   Lab Results  Component Value Date   DDIMER <0.22 01/25/2012   LIPASE  Date/Time Value Ref Range Status  01/21/2016 10:43 AM 28 11 - 51 U/L Final   AMYLASE  Date/Time Value Ref Range Status  01/04/2016 03:05 AM 83 28 - 100 U/L Final   No results found for: VITAMINB12, FOLATE, FERRITIN, TIBC, IRON, RETICCTPCT  Echo: 12/25/2014 LV EF: 40% -  45% Study Conclusions - Left ventricle: The cavity size was severely dilated. Wall thickness was increased in a pattern of mild LVH. Systolic function was mildly to moderately reduced. The estimated ejection fraction was in the range of 40% to 45%. Diffuse hypokinesis. There is akinesis of the inferior myocardium. Doppler parameters are consistent with abnormal left ventricular relaxation (grade 1 diastolic dysfunction). - Left atrium: The atrium was moderately dilated. Impressions: - Inferior akinesis with mild to moderately reduced LV function; grade 1 diastolic dysfunction; if clinically indicated, TEE would have better sensitivity for source of embolus.  ECG:  LBBB, Vpaced underlying afib  Radiology:  Ct Abdomen Pelvis W Contrast  01/21/2016  CLINICAL DATA:  Nausea, vomiting and constipation. Status post repair of an abdominal aortic aneurysm 01/03/2016. Subsequent encounter. EXAM: CT ABDOMEN AND PELVIS WITH CONTRAST TECHNIQUE: Multidetector CT imaging of the abdomen and pelvis was performed using the standard protocol following bolus administration of intravenous contrast. CONTRAST:  100 mL OMNIPAQUE IOHEXOL 300 MG/ML  SOLN COMPARISON:  CTA abdomen and pelvis  10/19/2015 and 10/08/2009. FINDINGS: There are very small bilateral pleural effusions. No pericardial effusion is identified. Cardiomegaly is noted. Fluid is present in the visualized distal esophagus compatible with reflux and/or poor motility. The patient is status post abdominal aortic aneurysm repair. There is small amount of scattered periaortic fluid and a small amount of fluid anterior to the right common iliac artery. No rim enhancing fluid collection or soft tissue gas is identified. There is no evidence of hemorrhage. Three small hepatic cysts are unchanged. The liver is otherwise unremarkable. The gallbladder, adrenal glands and pancreas appear normal. 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 The spleen otherwise appears normal. The can't trash of the kidneys demonstrate prompt and homogeneous enhancement. No hydronephrosis or focal lesion. The urinary bladder is unremarkable. The prostate gland is mildly prominent. The stomach and small and large bowel appear normal. There is no lymphadenopathy. Bones demonstrate postoperative change of vertebral augmentation from L1-2 L5. No lytic or sclerotic bony lesion is identified. No acute fracture. IMPRESSION: Status post repair of an abdominal aortic aneurysm. 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 A very small amount of simple, scattered fluid in the abdomen is likely related to the patient's recent surgery. No organized fluid collection or hemorrhage is identified. Cardiomegaly. Fluid in the distal esophagus suggestive of reflux and/or poor motility. Very small bilateral pleural effusions.  Electronically Signed   By: Drusilla Kanner M.D.   On: 01/21/2016 13:10    ASSESSMENT AND PLAN:    Active Problems:   HYPERCHOLESTEROLEMIA, PURE   HYPERTENSION, BENIGN   Atrial fibrillation (HCC)   Long term current use of anticoagulant   COPD (chronic  obstructive pulmonary disease) (HCC)   Abdominal aortic aneurysm (HCC)   Chronic systolic heart failure (HCC)   Automatic implantable cardioverter-defibrillator in situ   GI bleed  LABARON DIGIROLAMO is a 75 y.o. male with a history of HTN, HLD, carotid artery disease s/p CEA, CAD s/p CABG (1998), ischemic CM (EF 40-45%), chronic systolic CHF s/p BiV ICD, LBBB, CVA, COPD, PAF on Pradaxa and AAA s/p open infrarenal abdominal aortic aneurysm repair By Dr. Tawanna Cooler Early 01/03/2016 who presented to [facility] with N/V with hemoptysis. His troponin was noted to be elevated at 0.2 and cardiology was consulted.   Elevated troponin: low and flat. 0.02--> 0.21. Continue to monitor. Likely demand ischemia. No chest pain or SOB. Recent nuc with no ischemia in 11/2015  Persistent afib: if truly having hemoptysis, we will need to hold pradaxa, but he will need to be resumed as soon as cleared from a GI standpoint. He is at high risk for cardioembolic event with previous persistent afib, previous CVAs and high CHADSVASC of 6. Continue BB for rate control  Ischemic CM: EF 40-45% by last ECHO and 25 % by most recent myoview. He appears euvolemic. Continue current regimen. Continue BB and ACE  CAD: stable. Continue statin and BB. No asa due to pradaxa use.   HTN: BP well controlled on current regimen  N/V/hemoptysis: per IM   Signed: Janetta Hora, PA-C 01/21/2016 3:48 PM  Pager [phone]  Co-Sign MD  Patient seen and examined and history reviewed. Agree with above findings and plan. 75 yo WM with complex cardiac history as noted above. Presents with protracted N/V. Today noted hematemesis. Isn't sure when this started as he didn't turn the light on last night. On Pradaxa for history of Afib. Currently he is without chest pain, SOB, or edema. Still a little nauseated. On Exam VSS. No edema. Lungs clear. No gallop or murmur Ecg shows Afib with ventricular paced rhythm. Troponin is slightly elevated 0.2. Hgb  is stable.  I think troponin level is not significant in this setting. No active cardiac symptoms.  No active CHF. Will need to hold Pradaxa until cleared by GI.   Peter [place], MDFACC 01/21/2016 4:43 PM

## 2016-01-21 NOTE — ED Notes (Addendum)
Spoke with Dr. Randol Kern about pt Diet. Per MD via verbal order change pt diet to Clear Liquids. Order placed.

## 2016-01-21 NOTE — Consult Note (Signed)
[facility] Consult: 3:24 PM 01/21/2016     Referring Provider: Dr Randol Kern  Primary Care Physician:  [name] Rossetti, MD Primary Gastroenterologist:  unassigned     Reason for Consultation:  Minor hematemesis.    HPI: [name] [name] is a 75 y.o. male.  Pt on long term Pradaxa for A fib. CAD.  CABG 1998.  S/p pacemaker/ICD.  Hx CVA 12/2014.[name] [name]  Left CEA 2005.  S/p Aortic and iliac aneurysm repair with a 22 x 11 Hemashield aorta to bilateral common iliac artery bypass 01/03/16 for expanding aneurysm.  Doing well at and since discharge.  Has ROV with vasc surgery on 2/14.  Pt having dry mouth and dysphagia located in upper crvical esophagus for ~ one week.  Has had to regurgitate some of his po's (mostly solids).  Never had this issue before.   No heartburn.  Abdominal pain is post surgical and mostly resolved. No anorexia. Beginning ~ 3:30 PM yesterday he started having frequent vomiting, did not observe what the emesis looked like until this AM, when he says it was yellow and clear.  Emesis observed once he arrived at the emergency room around 9:00 this morning was noted to be reddish and small volume. Patient received doses of Zofran at 1040 and 1312. Loading dose of IV Protonix was given at 1441.  He hasn't thrown up for at least 2 or 3 hours. He is asking for food and is very hungry.  FOBT is negative. Hgb 12.8, MCV 88.  Baseline 13 to 14 earlier this year PT/INR 15/9/1.2 BUN is not elevated.  CT with contrast: fluid in distal esophagus: ? Reflux and/or poor motility  Pt never had colonoscopy or EGD.  Says he was told he had ulcer at age 50.   Weight drop of ~ 20 pounds in last several weeks.  No issues with excessive bleeding or bruising Last Pradaxa was AM 01/20/16.   Last BM (brown) was 01/18/16.  Normally bms  every 2 to 3 days.  No laxatives used.    Past Medical History  Diagnosis Date  . Atrial fibrillation (HCC)     s/p prior DCCV;  Amiodarone Rx.  . CAD (coronary artery disease)     s/p CABG;  cath 10/07:  LM ok, LAD occluded, AV CFX 95%, pRCA occluded; L-LAD, S-OM2/OM3, S-PDA ok  . Cerebrovascular disease     s/p prior Left CEA;  followed by Dr. Arbie [name]  . COPD (chronic obstructive pulmonary disease) (HCC)   . Hyperlipidemia   . Hypertension   . Mitral insufficiency     hx of. not noted on echo 01/2012  . Nephrolithiasis   . Ischemic cardiomyopathy     echo 5/09: EF 55%, mild LAE;  Myoview 11/10: inf and apical scar with mild peri-infarct ischemia, EF 44%. Echo 01/26/12 with EF 20-25% but previously known to be 30% on echoes before last one  . Iliac artery aneurysm, left (HCC)     followed by Dr. Arbie [name]  . Aortic dissection (HCC)     H/O focal aortic dissection  .  LBBB (left bundle branch block)   . CHF (congestive heart failure) (HCC)   . Myocardial infarction (HCC)   . Renal insufficiency     01/2012  . Stroke (HCC)   . CVA (cerebral infarction)   . DVT (deep venous thrombosis) (HCC)   . Fall from slipping on wet surface Apr 15, 2015    Fx 3 Vertabrea  . AICD (automatic cardioverter/defibrillator) present   . Presence of permanent cardiac pacemaker   . Shortness of breath dyspnea     Past Surgical History  Procedure Laterality Date  . Uteroscopy    . Coronary artery bypass graft  1998  . Left cea  07/05  . Appendectomy  09/11/11  . Biventricular defibrillator implantation  08/22/12    SJM Quadra Assura BiV ICD implanted by Dr [name]  . Spine surgery  Sep 27, 2014    Lower Back L 4  and   L 5  . Bi-ventricular implantable cardioverter defibrillator N/A 08/22/2012    Procedure: BI-VENTRICULAR IMPLANTABLE CARDIOVERTER DEFIBRILLATOR  (CRT-D);  Surgeon: [name] Range, MD;  Location: [facility] CATH LAB;  Service: Cardiovascular;  Laterality: N/A;  . Carotid endarterectomy  July  2005     left  . Cardiac catheterization  2013  . Abdominal aortic aneurysm repair N/A 01/03/2016    Procedure: ANEURYSM ABDOMINAL AORTA, BILATERAL COMMON ILIAC REPAIR;  Surgeon: [name] Earthly, MD;  Location: [facility] OR;  Service: Vascular;  Laterality: N/A;    Prior to Admission medications   Medication Sig Start Date End Date Taking? Authorizing Provider  albuterol (PROVENTIL HFA;VENTOLIN HFA) 108 (90 BASE) MCG/ACT inhaler Inhale 2 puffs into the lungs every 4 (four) hours as needed for wheezing. 03/29/13  Yes [name] Laine, MD  amiodarone (PACERONE) 200 MG tablet Take 200 mg by mouth daily.   Yes Historical Provider, MD  amLODipine (NORVASC) 10 MG tablet Take 10 mg by mouth daily.   Yes Historical Provider, MD  citalopram (CELEXA) 10 MG tablet Take 10 mg by mouth daily.   Yes Historical Provider, MD  dabigatran (PRADAXA) 150 MG CAPS capsule Take 1 capsule (150 mg total) by mouth 2 (two) times daily. 03/08/15  Yes [name] Bunting, MD  furosemide (LASIX) 20 MG tablet Take 2 tablets (40 mg total) by mouth as needed for edema. 12/31/15  Yes [name] Bunting, MD  hydrALAZINE (APRESOLINE) 50 MG tablet Take 0.5 tablets (25 mg total) by mouth 3 (three) times daily. 12/15/14  Yes [name] Bunting, MD  ipratropium-albuterol (DUONEB) 0.5-2.5 (3) MG/3ML SOLN Take 3 mLs by nebulization every 4 (four) hours as needed (for shortness of breath). 12/01/15  Yes [name] Peer, MD  lisinopril (PRINIVIL,ZESTRIL) 40 MG tablet TAKE 1 TABLET EVERY DAY 12/29/14  Yes [name] Bunting, MD  metoprolol tartrate (LOPRESSOR) 25 MG tablet Take 12.5 mg by mouth 2 (two) times daily.   Yes Historical Provider, MD  Multiple Vitamin (MULITIVITAMIN WITH MINERALS) TABS Take 1 tablet by mouth daily.   Yes Historical Provider, MD  niacin (NIASPAN) 500 MG CR tablet Take 1,000 mg by mouth 2 (two) times daily.    Yes Historical Provider, MD  potassium chloride SA (K-DUR,KLOR-CON) 20 MEQ tablet TAKE 2 TABLETS THREE TIMES DAILY 04/13/15  Yes [name] Bunting,  MD  pravastatin (PRAVACHOL) 80 MG tablet Take 1 tablet (80 mg total) by mouth daily. 06/15/15  Yes [name] Bunting, MD  tiotropium (SPIRIVA HANDIHALER) 18 MCG inhalation capsule Place 1 capsule (18 mcg total) into inhaler and inhale  daily. 10/29/15 10/28/16 Yes [name] Peer, MD  traMADol (ULTRAM) 50 MG tablet Take 1 tablet (50 mg total) by mouth every 6 (six) hours as needed for moderate pain. 01/07/16  Yes Raymond Gurney, PA-C    Scheduled Meds: . [START ON 01/25/2016] pantoprazole (PROTONIX) IV  40 mg Intravenous Q12H   Infusions: . pantoprozole (PROTONIX) infusion     PRN Meds:    Allergies as of 01/21/2016 - Review Complete 01/21/2016  Allergen Reaction Noted  . Morphine and related Other (See Comments) 01/21/2016    Family History  Problem Relation Age of Onset  . Stroke Maternal Uncle     Social History   Social History  . Marital Status: Married    Spouse Name: N/A  . Number of Children: 3  . Years of Education: 10 TH   Occupational History  . RETIRED---truck driver    Social History Main Topics  . Smoking status: Former Smoker -- 1.50 packs/day for 57 years    Types: Cigarettes    Quit date: 12/11/2004  . Smokeless tobacco: Never Used  . Alcohol Use: No     Comment: 6 yrs ago   . Drug Use: No  . Sexual Activity: No   Other Topics Concern  . Not on file   Social History Narrative   Patient is married with 3 children.   Patient is right handed.   Patient has 10 th grade education.   Patient drinks tea occasionally.    REVIEW OF SYSTEMS: Constitutional:  He will to climb 8-10 stairs without shortness of breath. No gait instability. No fatigue, dizziness or weakness. ENT:  No nose bleeds Pulm:  Quit smoking cigarettes many years ago. No cough. No significant DOE. CV:  No palpitations. No chest pain. Occasional lower extremity edema which she treats with when necessary dosing of Lasix. GU:  No hematuria, no frequency GI:  Per HPI Heme:  Per HPI     Transfusions:  None Neuro:  No headaches, no peripheral tingling or numbness Derm:  No itching, no rash or sores.  Endocrine:  No sweats or chills.  No polyuria or dysuria Immunization:  Not queried. Travel:  None beyond local counties in last few months.    PHYSICAL EXAM: Vital signs in last 24 hours: Filed Vitals:   01/21/16 1200 01/21/16 1215  BP: 161/98 147/106  Pulse: 89 88  Temp:    Resp: 21 11   Wt Readings from Last 3 Encounters:  01/21/16 102.967 kg (227 lb)  01/04/16 103 kg (227 lb 1.2 oz)  12/27/15 98.93 kg (218 lb 1.6 oz)    General: Pleasant. Comfortable. Looks chronically unwell. Head:  No signs of head trauma. No facial asymmetry or swelling.  Eyes:  Scleral icterus. Conjunctiva pink. EOMI. Ears:  No significant hearing loss grossly.  Nose:  No discharge, no congestion Mouth:  Dentures in place, not removed for exam. Oral mucosa is pink, moist and clear. Tongue is midline. Neck:  No JVD, no masses, no TMG. Lungs:  Diminished breath sounds throughout. No adventitious sounds. No dyspnea. No cough. Heart: RRR. No MRG. S1/S2 audible. Pacemaker placement on upper left chest. Abdomen:  NT, ND. No HSM. No masses. Not protuberant or distended. Soft. The long midline scar consistent with his recent aneurysm repair is clean, dry, intact and healing nicely..   Rectal: Deferred   Musc/Skeltl: No joint redness, contracture deformity or swelling. Extremities:  No CCE.  Neurologic:  Patient is alert. He is oriented 3. There  is no tremor. Patient moves all 4 limbs, limb strength was not tested. Skin:  Somewhat ashen coloring to his skin. Skin on the lower legs has a vitiligo, hypopigmented, appearance. Tattoos:  None observed. Nodes:  No cervical adenopathy.   Psych:  Pleasant, calm. Does not appear depressed or anxious.  Intake/Output from previous day:   Intake/Output this shift:    LAB RESULTS:  Recent Labs  01/21/16 1043  WBC 5.8  HGB 12.8*  HCT 40.3  PLT  223   BMET Lab Results  Component Value Date   NA 142 01/21/2016   NA 142 01/06/2016   NA 146* 01/05/2016   K 3.6 01/21/2016   K 3.8 01/06/2016   K 4.5 01/05/2016   CL 107 01/21/2016   CL 108 01/06/2016   CL 105 01/05/2016   CO2 24 01/21/2016   CO2 29 01/06/2016   CO2 26 01/05/2016   GLUCOSE 114* 01/21/2016   GLUCOSE 100* 01/06/2016   GLUCOSE 117* 01/05/2016   BUN 12 01/21/2016   BUN 26* 01/06/2016   BUN 29* 01/05/2016   CREATININE 1.09 01/21/2016   CREATININE 1.13 01/06/2016   CREATININE 1.43* 01/05/2016   CALCIUM 8.5* 01/21/2016   CALCIUM 7.5* 01/06/2016   CALCIUM 7.1* 01/05/2016   LFT  Recent Labs  01/21/16 1043  PROT 5.7*  ALBUMIN 2.8*  AST 20  ALT 14*  ALKPHOS 64  BILITOT 1.2   PT/INR Lab Results  Component Value Date   INR 1.26 01/03/2016   INR 1.15 12/27/2015   INR 1.32 01/10/2015   PROTIME 18.5 05/19/2009   Hepatitis Panel No results for input(s): HEPBSAG, HCVAB, HEPAIGM, HEPBIGM in the last 72 hours. C-Diff No components found for: CDIFF Lipase     Component Value Date/Time   LIPASE 28 01/21/2016 1043    Drugs of Abuse     Component Value Date/Time   LABOPIA NONE DETECTED 01/14/2013 0417   COCAINSCRNUR NONE DETECTED 01/14/2013 0417   LABBENZ NONE DETECTED 01/14/2013 0417   AMPHETMU NONE DETECTED 01/14/2013 0417   THCU NONE DETECTED 01/14/2013 0417   LABBARB NONE DETECTED 01/14/2013 0417     RADIOLOGY STUDIES: Ct Abdomen Pelvis W Contrast  01/21/2016  CLINICAL DATA:  Nausea, vomiting and constipation. Status post repair of an abdominal aortic aneurysm 01/03/2016. Subsequent encounter. EXAM: CT ABDOMEN AND PELVIS WITH CONTRAST TECHNIQUE: Multidetector CT imaging of the abdomen and pelvis was performed using the standard protocol following bolus administration of intravenous contrast. CONTRAST:  100 mL OMNIPAQUE IOHEXOL 300 MG/ML  SOLN COMPARISON:  CTA abdomen and pelvis 10/19/2015 and 10/08/2009. FINDINGS: There are very small bilateral  pleural effusions. No pericardial effusion is identified. Cardiomegaly is noted. Fluid is present in the visualized distal esophagus compatible with reflux and/or poor motility. The patient is status post abdominal aortic aneurysm repair. There is small amount of scattered periaortic fluid and a small amount of fluid anterior to the right common iliac artery. No rim enhancing fluid collection or soft tissue gas is identified. There is no evidence of hemorrhage. Three small hepatic cysts are unchanged. The liver is otherwise unremarkable. The gallbladder, adrenal glands and pancreas appear normal. 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 The spleen otherwise appears normal. The can't trash of the kidneys demonstrate prompt and homogeneous enhancement. No hydronephrosis or focal lesion. The urinary bladder is unremarkable. The prostate gland is mildly prominent. The stomach and small and large bowel appear normal.  There is no lymphadenopathy. Bones demonstrate postoperative change of vertebral augmentation from L1-2 L5. No lytic or sclerotic bony lesion is identified. No acute fracture. IMPRESSION: Status post repair of an abdominal aortic aneurysm. 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 A very small amount of simple, scattered fluid in the abdomen is likely related to the patient's recent surgery. No organized fluid collection or hemorrhage is identified. Cardiomegaly. Fluid in the distal esophagus suggestive of reflux and/or poor motility. Very small bilateral pleural effusions. Electronically Signed   By: Drusilla Kanner M.D.   On: 01/21/2016 13:10    ENDOSCOPIC STUDIES: None ever.  IMPRESSION:   *  Small to moderate volume hematemesis, appers to have occurred after multiple incidences of non-bloody emesis.  R/O Coso irritation from frequent retching. Rule out Mallory-Weiss tear. Rule out ulcer  disease. By history has been having dysphagia for about a week. CT abdomen confirms fluid contained in the distal esophagus questioning whether this represents reflux for this motility.  *  Chronic Pradaxa.  A fib.  .  *  Minor, normocytic anemia.   *  Status post abdominal aortic aneurysm and iliac bypass 01/03/16.  *  CAD. CABG 1998.  Ischemic cardiomyopathy. EF in 2013 20-25%, 40-45% in 12/2014. Left bundle branch block.  Status post biventricular ICD.      PLAN:     *  Per Dr Lavon Paganini.    *  Twice daily PPI. Either po or IV.  CBC in AM.   *  Currently the patient's nausea has subsided for the last few hours, he is complaining bitterly of being hungry. We will defer decision as to whether the patient can have clear liquids to Dr. Dorris Carnes.  *  Perform EGD, timing per Dr. Dorris Carnes.  Since he is stable and his hemoglobin is not significantly altered, can simply hold Pradaxa and allow time for Pradaxa washout after which she can be scheduled for EGD.     Jennye Moccasin  01/21/2016, 3:24 PM Pager: [phone]      Attending physician's note   I have taken a history, examined the patient and reviewed the chart. I agree with the Advanced Practitioner's note, impression and recommendations. Patient is hemodynamically stable and no significant change in hemoglobin. Likely Mallory-Weiss tear in the setting of recurrent vomiting due to   ? Viral gastroenteritis given the sudden onset of symptoms. Hold Pradaxa for now. We will plan for EGD based on his clinical course. Monitor hemoglobin daily and transfuse as needed.  Iona Beard, MD [phone] Mon-Fri 8a-5p [phone] after 5p, weekends, holidays

## 2016-01-21 NOTE — ED Notes (Signed)
Pt unable to void at this time. 

## 2016-01-21 NOTE — ED Notes (Signed)
Pt arrived from home c/o nausea and vomiting since yesterday. Pt is 2.5 weeks post op for Aortic Aneurysm. Pt denies any pain, fever, or abd pain.

## 2016-01-21 NOTE — H&P (Signed)
Patient Demographics  [name] [name], is a 75 y.o. male  MRN: [id number]   DOB - [birth year]  Admit Date - 01/21/2016  Outpatient Primary MD for the patient is [name], MD   With History of -  Past Medical History  Diagnosis Date  . Atrial fibrillation (HCC)     s/p prior DCCV;  Amiodarone Rx.  . CAD (coronary artery disease)     s/p CABG;  cath 10/07:  LM ok, LAD occluded, AV CFX 95%, pRCA occluded; L-LAD, S-OM2/OM3, S-PDA ok  . Cerebrovascular disease     s/p prior Left CEA;  followed by Dr. [name]  . COPD (chronic obstructive pulmonary disease) (HCC)   . Hyperlipidemia   . Hypertension   . Mitral insufficiency     hx of. not noted on echo 01/2012  . Nephrolithiasis   . Ischemic cardiomyopathy     echo 5/09: EF 55%, mild LAE;  Myoview 11/10: inf and apical scar with mild peri-infarct ischemia, EF 44%. Echo 01/26/12 with EF 20-25% but previously known to be 30% on echoes before last one  . Iliac artery aneurysm, left (HCC)     followed by Dr. [name]  . Aortic dissection (HCC)     H/O focal aortic dissection  . LBBB (left bundle branch block)   . CHF (congestive heart failure) (HCC)   . Myocardial infarction (HCC)   . Renal insufficiency     01/2012  . Stroke (HCC)   . CVA (cerebral infarction)   . DVT (deep venous thrombosis) (HCC)   . Fall from slipping on wet surface Apr 15, 2015    Fx 3 Vertabrea  . AICD (automatic cardioverter/defibrillator) present   . Presence of permanent cardiac pacemaker   . Shortness of breath dyspnea       Past Surgical History  Procedure Laterality Date  . Uteroscopy    . Coronary artery bypass graft  1998  . Left cea  07/05  . Appendectomy  09/11/11  . Biventricular defibrillator implantation  08/22/12    SJM Quadra Assura BiV ICD implanted by Dr [name]  . Spine surgery  Sep 27, 2014    Lower Back L 4  and   L 5  . Bi-ventricular implantable cardioverter defibrillator N/A 08/22/2012    Procedure: BI-VENTRICULAR IMPLANTABLE  CARDIOVERTER DEFIBRILLATOR  (CRT-D);  Surgeon: [name], MD;  Location: [facility] CATH LAB;  Service: Cardiovascular;  Laterality: N/A;  . Carotid endarterectomy  July  2005    left  . Cardiac catheterization  2013  . Abdominal aortic aneurysm repair N/A 01/03/2016    Procedure: ANEURYSM ABDOMINAL AORTA, BILATERAL COMMON ILIAC REPAIR;  Surgeon: [name], MD;  Location: [facility] OR;  Service: Vascular;  Laterality: N/A;    in for   Chief Complaint  Patient presents with  . Emesis     HPI  [name] [name]  is a 75 y.o. male, with past medical history of HTN, hyperlipidemia, COPD, atrial fibrillation on Pradaxa, history of stroke, combined diastolic and systolic congestive heart failure, AICD placement, AAA, left bundle blockage, mitral valve disorder, coronary artery disease, with recent admission by vascular surgery status post Aortic and iliac aneurysm repair with a 22 x 11 Hemashield aorta to bilateral common iliac artery bypass by Dr. [name] last month. Patient presents with complaints of nausea and vomiting over last 24 hours, denies diarrhea, abdominal pain, constipation, patient in ED was noticed to have significant amount of hematemesis, he is on Pradaxa for A. Fib,  CT abdomen pelvis with contrast showing stable postoperative AAA repair changes, hemoglobin remained stable, patient is Hemoccult negative, denies any melena, last BM before 4 days, troponin was checked, which was positive, he denies any chest pain or shortness of breath, I was called to admit    Review of Systems    In addition to the HPI above, No Fever-chills, No Headache, No changes with Vision or hearing, No problems swallowing food or Liquids, No Chest pain, Cough or Shortness of Breath, No Abdominal pain, reports nausea and vomiting over last 24 hours, one episode of hematemesis in ED. No Blood in stool or Urine, No dysuria, No new skin rashes or bruises, No new joints pains-aches,  No new weakness, tingling,  numbness in any extremity, No recent weight gain or loss, No polyuria, polydypsia or polyphagia, No significant Mental Stressors.  A full 10 point Review of Systems was done, except as stated above, all other Review of Systems were negative.   Social History Social History  Substance Use Topics  . Smoking status: Former Smoker -- 1.50 packs/day for 57 years    Types: Cigarettes    Quit date: 12/11/2004  . Smokeless tobacco: Never Used  . Alcohol Use: No     Comment: 6 yrs ago    Family History Family History  Problem Relation Age of Onset  . Stroke Maternal Uncle     Prior to Admission medications   Medication Sig Start Date End Date Taking? Authorizing Provider  albuterol (PROVENTIL HFA;VENTOLIN HFA) 108 (90 BASE) MCG/ACT inhaler Inhale 2 puffs into the lungs every 4 (four) hours as needed for wheezing. 03/29/13  Yes Cathren Laine, MD  amiodarone (PACERONE) 200 MG tablet Take 200 mg by mouth daily.   Yes Historical Provider, MD  amLODipine (NORVASC) 10 MG tablet Take 10 mg by mouth daily.   Yes Historical Provider, MD  citalopram (CELEXA) 10 MG tablet Take 10 mg by mouth daily.   Yes Historical Provider, MD  dabigatran (PRADAXA) 150 MG CAPS capsule Take 1 capsule (150 mg total) by mouth 2 (two) times daily. 03/08/15  Yes Lewayne Bunting, MD  furosemide (LASIX) 20 MG tablet Take 2 tablets (40 mg total) by mouth as needed for edema. 12/31/15  Yes Lewayne Bunting, MD  hydrALAZINE (APRESOLINE) 50 MG tablet Take 0.5 tablets (25 mg total) by mouth 3 (three) times daily. 12/15/14  Yes Lewayne Bunting, MD  ipratropium-albuterol (DUONEB) 0.5-2.5 (3) MG/3ML SOLN Take 3 mLs by nebulization every 4 (four) hours as needed (for shortness of breath). 12/01/15  Yes Leslye Peer, MD  lisinopril (PRINIVIL,ZESTRIL) 40 MG tablet TAKE 1 TABLET EVERY DAY 12/29/14  Yes Lewayne Bunting, MD  metoprolol tartrate (LOPRESSOR) 25 MG tablet Take 12.5 mg by mouth 2 (two) times daily.   Yes Historical Provider,  MD  Multiple Vitamin (MULITIVITAMIN WITH MINERALS) TABS Take 1 tablet by mouth daily.   Yes Historical Provider, MD  niacin (NIASPAN) 500 MG CR tablet Take 1,000 mg by mouth 2 (two) times daily.    Yes Historical Provider, MD  potassium chloride SA (K-DUR,KLOR-CON) 20 MEQ tablet TAKE 2 TABLETS THREE TIMES DAILY 04/13/15  Yes Lewayne Bunting, MD  pravastatin (PRAVACHOL) 80 MG tablet Take 1 tablet (80 mg total) by mouth daily. 06/15/15  Yes Lewayne Bunting, MD  tiotropium (SPIRIVA HANDIHALER) 18 MCG inhalation capsule Place 1 capsule (18 mcg total) into inhaler and inhale daily. 10/29/15 10/28/16 Yes Leslye Peer, MD  traMADol Janean Sark) 50  MG tablet Take 1 tablet (50 mg total) by mouth every 6 (six) hours as needed for moderate pain. 01/07/16  Yes Raymond Gurney, PA-C    Allergies  Allergen Reactions  . Morphine And Related Other (See Comments)    Patient stated that he did not do well on this medication. He felt like climbing the walls    Physical Exam  Vitals  Blood pressure 147/106, pulse 88, temperature 98.7 F (37.1 C), temperature source Oral, resp. rate 11, height 6\' 1"  (1.854 m), weight 102.967 kg (227 lb), SpO2 98 %.   1. General well-developed male lying in bed in NAD,   2. Normal affect and insight, Not Suicidal or Homicidal, Awake Alert, Oriented X 3.  3. No F.N deficits, ALL C.Nerves Intact, Strength 5/5 all 4 extremities, Sensation intact all 4 extremities, Plantars down going.  4. Ears and Eyes appear Normal, Conjunctivae clear, PERRLA. Moist Oral Mucosa.  5. Supple Neck, No JVD, No cervical lymphadenopathy appriciated, No Carotid Bruits.  6. Symmetrical Chest wall movement, Good air movement bilaterally, CTAB.  7. No Gallops, Rubs or Murmurs, No Parasternal Heave.  8. Positive Bowel Sounds, Abdomen Soft, abdominal surgical scar healed  No tenderness, No organomegaly appriciated,No rebound -guarding or rigidity.  9.  No Cyanosis, Normal Skin Turgor, No Skin Rash or  Bruise.  10. Good muscle tone,  joints appear normal , no effusions, Normal ROM.  11. No Palpable Lymph Nodes in Neck or Axillae    Data Review  CBC  Recent Labs Lab 01/21/16 1043  WBC 5.8  HGB 12.8*  HCT 40.3  PLT 223  MCV 88.2  MCH 28.0  MCHC 31.8  RDW 16.5*  LYMPHSABS 1.0  MONOABS 0.5  EOSABS 0.1  BASOSABS 0.0   ------------------------------------------------------------------------------------------------------------------  Chemistries   Recent Labs Lab 01/21/16 1043  NA 142  K 3.6  CL 107  CO2 24  GLUCOSE 114*  BUN 12  CREATININE 1.09  CALCIUM 8.5*  AST 20  ALT 14*  ALKPHOS 64  BILITOT 1.2   ------------------------------------------------------------------------------------------------------------------ estimated creatinine clearance is 76.1 mL/min (by C-G formula based on Cr of 1.09). ------------------------------------------------------------------------------------------------------------------ No results for input(s): TSH, T4TOTAL, T3FREE, THYROIDAB in the last 72 hours.  Invalid input(s): FREET3   Coagulation profile No results for input(s): INR, PROTIME in the last 168 hours. ------------------------------------------------------------------------------------------------------------------- No results for input(s): DDIMER in the last 72 hours. -------------------------------------------------------------------------------------------------------------------  Cardiac Enzymes  Recent Labs Lab 01/21/16 1043  TROPONINI 0.20*   ------------------------------------------------------------------------------------------------------------------ Invalid input(s): POCBNP   ---------------------------------------------------------------------------------------------------------------  Urinalysis    Component Value Date/Time   COLORURINE YELLOW 12/27/2015 1034   APPEARANCEUR CLEAR 12/27/2015 1034   LABSPEC 1.025 12/27/2015 1034    PHURINE 6.0 12/27/2015 1034   GLUCOSEU 100* 12/27/2015 1034   HGBUR NEGATIVE 12/27/2015 1034   BILIRUBINUR SMALL* 12/27/2015 1034   KETONESUR 15* 12/27/2015 1034   PROTEINUR 30* 12/27/2015 1034   UROBILINOGEN 1.0 10/24/2015 2051   NITRITE NEGATIVE 12/27/2015 1034   LEUKOCYTESUR TRACE* 12/27/2015 1034    ----------------------------------------------------------------------------------------------------------------  Imaging results:   Ct Abdomen Pelvis W Contrast  01/21/2016  CLINICAL DATA:  Nausea, vomiting and constipation. Status post repair of an abdominal aortic aneurysm 01/03/2016. Subsequent encounter. EXAM: CT ABDOMEN AND PELVIS WITH CONTRAST TECHNIQUE: Multidetector CT imaging of the abdomen and pelvis was performed using the standard protocol following bolus administration of intravenous contrast. CONTRAST:  100 mL OMNIPAQUE IOHEXOL 300 MG/ML  SOLN COMPARISON:  CTA abdomen and pelvis 10/19/2015 and 10/08/2009. FINDINGS: There are very small  bilateral pleural effusions. No pericardial effusion is identified. Cardiomegaly is noted. Fluid is present in the visualized distal esophagus compatible with reflux and/or poor motility. The patient is status post abdominal aortic aneurysm repair. There is small amount of scattered periaortic fluid and a small amount of fluid anterior to the right common iliac artery. No rim enhancing fluid collection or soft tissue gas is identified. There is no evidence of hemorrhage. Three small hepatic cysts are unchanged. The liver is otherwise unremarkable. The gallbladder, adrenal glands and pancreas appear normal. 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 The spleen otherwise appears normal. The can't trash of the kidneys demonstrate prompt and homogeneous enhancement. No hydronephrosis or focal lesion. The urinary bladder is unremarkable. The prostate gland is mildly prominent. The stomach and  small and large bowel appear normal. There is no lymphadenopathy. Bones demonstrate postoperative change of vertebral augmentation from L1-2 L5. No lytic or sclerotic bony lesion is identified. No acute fracture. IMPRESSION: Status post repair of an abdominal aortic aneurysm. 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 A very small amount of simple, scattered fluid in the abdomen is likely related to the patient's recent surgery. No organized fluid collection or hemorrhage is identified. Cardiomegaly. Fluid in the distal esophagus suggestive of reflux and/or poor motility. Very small bilateral pleural effusions. Electronically Signed   By: Drusilla Kanner M.D.   On: 01/21/2016 13:10      Assessment & Plan  Active Problems:   HYPERCHOLESTEROLEMIA, PURE   HYPERTENSION, BENIGN   Atrial fibrillation (HCC)   Long term current use of anticoagulant   COPD (chronic obstructive pulmonary disease) (HCC)   Abdominal aortic aneurysm (HCC)   Chronic systolic heart failure (HCC)   Automatic implantable cardioverter-defibrillator in situ   GI bleed  Nausea and vomiting with hematemesis - Patient with nausea and vomiting over last 24 hours, unclear the color of vomiting at home, but one witnessed hematemesis by ED physician NAD. - In the lumen remains stable, will monitor every 8 hours, transfuse as needed. - We'll continue to hold parada, at this point we'll hold on starting reversal agent as hemoglobin remained stable, and no recurrence of hematemesis. - Continue with Protonix IV - GI consult greatly appreciated through out mother was to your, rule out ulcer disease  Positive troponins - Patient denies any chest pain or shortness of breath, troponin have been within normal limits few month ago. - Cardiology was consulted for further evaluation - Monitor on telemetry  Atrial fibrillation - Likely with paced rhythm, will hold Prudoxin given his hematemesis, will  continue with metoprolol and amiodarone for heart rate control  Abdominal aortic aneurysm status post surgical repair -  Vascular surgery consulted, no evidence of complication or oozing on CT abdomen and pelvis.  Chronic systolic heart failure - Appears to be euvolemic, continue with metoprolol, lisinopril, will resume more stable - Status post AICD  Hyperlipidemia - Continue statin  Hypertension - Blood pressure is acceptable, will resume back on metoprolol, will continue to hold the medication until he is more stable, will resume gradually.  COPD - No active wheezing, continue with nebs as needed  DVT Prophylaxis SCDs  AM Labs Ordered, also please review Full Orders  Family Communication: Admission, patients condition and plan of care including tests being ordered have been discussed with the patient who indicate understanding and agree with the plan  and Code Status.  Code Status Full  Condition GUARDED    Time spent in minutes : 60 minutes    [name] [name] M.D on 01/21/2016 at 3:46 PM  Between 7am to 7pm - Pager - [phone]  After 7pm go to [email or web address] - password [email or web address]  And look for the night coverage person covering me after hours  [facility] Office  [phone]

## 2016-01-21 NOTE — Consult Note (Signed)
[facility] NOTE MRN : [id number]  Reason for Consult: Vomiting/ nausea  Referring Physician: ED  History of Present Illness: 75 y/o male S/P open infrarenal abdominal aortic aneurysm repair By Dr. Tawanna Cooler Early 01/03/2016.  He was discharged post op day 4 tolerating regular diet.  He states that 36 hours ago after supper he got nauseated and vomited.  He has vomited every hour since then.   He states he has a BM every 3-4 days and had one 3 days ago.   He reports no fever, chills and no one in his family is sick.  He has been relatively stable from a cardiac standpoint. He does have known COPD as well. He is status post left carotid endarterectomy 10 years ago and remained stable from this standpoint.  He has currently on Pradaxa.   History of stroke, CAD, and MI.       Current Facility-Administered Medications  Medication Dose Route Frequency Provider Last Rate Last Dose  . pantoprazole (PROTONIX) 80 mg in sodium chloride 0.9 % 250 mL (0.32 mg/mL) infusion  8 mg/hr Intravenous Continuous Lavera Guise, MD      . Melene Muller ON 01/25/2016] pantoprazole (PROTONIX) injection 40 mg  40 mg Intravenous Q12H Lavera Guise, MD       Current Outpatient Prescriptions  Medication Sig Dispense Refill  . albuterol (PROVENTIL HFA;VENTOLIN HFA) 108 (90 BASE) MCG/ACT inhaler Inhale 2 puffs into the lungs every 4 (four) hours as needed for wheezing. 1 Inhaler 0  . amiodarone (PACERONE) 200 MG tablet Take 200 mg by mouth daily.    [name] amLODipine (NORVASC) 10 MG tablet Take 10 mg by mouth daily.    . citalopram (CELEXA) 10 MG tablet Take 10 mg by mouth daily.    . dabigatran (PRADAXA) 150 MG CAPS capsule Take 1 capsule (150 mg total) by mouth 2 (two) times daily. 180 capsule 3  . furosemide (LASIX) 20 MG tablet Take 2 tablets (40 mg total) by mouth as needed for edema. 60 tablet 12  . hydrALAZINE (APRESOLINE) 50 MG tablet Take 0.5 tablets (25 mg total) by mouth 3 (three) times daily.  135 tablet 3  . ipratropium-albuterol (DUONEB) 0.5-2.5 (3) MG/3ML SOLN Take 3 mLs by nebulization every 4 (four) hours as needed (for shortness of breath). 360 mL 5  . lisinopril (PRINIVIL,ZESTRIL) 40 MG tablet TAKE 1 TABLET EVERY DAY 90 tablet 3  . metoprolol tartrate (LOPRESSOR) 25 MG tablet Take 12.5 mg by mouth 2 (two) times daily.    . Multiple Vitamin (MULITIVITAMIN WITH MINERALS) TABS Take 1 tablet by mouth daily.    . niacin (NIASPAN) 500 MG CR tablet Take 1,000 mg by mouth 2 (two) times daily.     . potassium chloride SA (K-DUR,KLOR-CON) 20 MEQ tablet TAKE 2 TABLETS THREE TIMES DAILY 540 tablet 0  . pravastatin (PRAVACHOL) 80 MG tablet Take 1 tablet (80 mg total) by mouth daily. 90 tablet 2  . tiotropium (SPIRIVA HANDIHALER) 18 MCG inhalation capsule Place 1 capsule (18 mcg total) into inhaler and inhale daily. 30 capsule 2  . traMADol (ULTRAM) 50 MG tablet Take 1 tablet (50 mg total) by mouth every 6 (six) hours as needed for moderate pain. 30 tablet 0    Pt meds include: Statin :Yes Betablocker: Yes ASA: No Other anticoagulants/antiplatelets: Pradaxa  Past Medical History  Diagnosis Date  . Atrial fibrillation (HCC)     s/p prior DCCV;  Amiodarone Rx.  . CAD (coronary artery  disease)     s/p CABG;  cath 10/07:  LM ok, LAD occluded, AV CFX 95%, pRCA occluded; L-LAD, S-OM2/OM3, S-PDA ok  . Cerebrovascular disease     s/p prior Left CEA;  followed by Dr. [name]  . COPD (chronic obstructive pulmonary disease) (HCC)   . Hyperlipidemia   . Hypertension   . Mitral insufficiency     hx of. not noted on echo 01/2012  . Nephrolithiasis   . Ischemic cardiomyopathy     echo 5/09: EF 55%, mild LAE;  Myoview 11/10: inf and apical scar with mild peri-infarct ischemia, EF 44%. Echo 01/26/12 with EF 20-25% but previously known to be 30% on echoes before last one  . Iliac artery aneurysm, left (HCC)     followed by Dr. [name]  . Aortic dissection (HCC)     H/O focal aortic dissection  .  LBBB (left bundle branch block)   . CHF (congestive heart failure) (HCC)   . Myocardial infarction (HCC)   . Renal insufficiency     01/2012  . Stroke (HCC)   . CVA (cerebral infarction)   . DVT (deep venous thrombosis) (HCC)   . Fall from slipping on wet surface Apr 15, 2015    Fx 3 Vertabrea  . AICD (automatic cardioverter/defibrillator) present   . Presence of permanent cardiac pacemaker   . Shortness of breath dyspnea     Past Surgical History  Procedure Laterality Date  . Uteroscopy    . Coronary artery bypass graft  1998  . Left cea  07/05  . Appendectomy  09/11/11  . Biventricular defibrillator implantation  08/22/12    SJM Quadra Assura BiV ICD implanted by Dr [name]  . Spine surgery  Sep 27, 2014    Lower Back L 4  and   L 5  . Bi-ventricular implantable cardioverter defibrillator N/A 08/22/2012    Procedure: BI-VENTRICULAR IMPLANTABLE CARDIOVERTER DEFIBRILLATOR  (CRT-D);  Surgeon: [name], MD;  Location: [facility] CATH LAB;  Service: Cardiovascular;  Laterality: N/A;  . Carotid endarterectomy  July  2005    left  . Cardiac catheterization  2013  . Abdominal aortic aneurysm repair N/A 01/03/2016    Procedure: ANEURYSM ABDOMINAL AORTA, BILATERAL COMMON ILIAC REPAIR;  Surgeon: [name], MD;  Location: [facility] OR;  Service: Vascular;  Laterality: N/A;    Social History Social History  Substance Use Topics  . Smoking status: Former Smoker -- 1.50 packs/day for 57 years    Types: Cigarettes    Quit date: 12/11/2004  . Smokeless tobacco: Never Used  . Alcohol Use: No     Comment: 6 yrs ago     Family History Family History  Problem Relation Age of Onset  . Stroke Maternal Uncle     Allergies  Allergen Reactions  . Morphine And Related Other (See Comments)    Patient stated that he did not do well on this medication. He felt like climbing the walls     REVIEW OF SYSTEMS  General:  Weight loss,  Fever,  chills Neurologic:  Dizziness,  Blackouts, [  ] Seizure  Stroke,  "Mini stroke",  Slurred speech,  Temporary blindness;  weakness in arms or legs,  Hoarseness  Dysphagia Cardiac:  Chest pain/pressure,  Shortness of breath at rest  Shortness of breath with exertion,  Atrial fibrillation or irregular heartbeat  Vascular:  Pain in legs with walking,   Pain in legs at rest, [ ]  Pain in legs at night,  [ ]  Non-healing ulcer, [ ]  Blood clot in vein/DVT,   Pulmonary: [ ]  Home oxygen, [ ]  Productive cough, [ ]  Coughing up blood, [ ]  Asthma,  [ ]  Wheezing [ ]  COPD Musculoskeletal:  [ ]  Arthritis, [ ]  Low back pain, [x ] Joint pain right knee Hematologic: [x ] Easy Bruising, [ ]  Anemia; [ ]  Hepatitis Gastrointestinal: [ ]  Blood in stool, [ ]  Gastroesophageal Reflux/heartburn, [x]  N/V Urinary: [x ] chronic Kidney disease, [ ]  on HD - [ ]  MWF or [ ]  TTHS, [ ]  Burning with urination, [ ]  Difficulty urinating Skin: [ ]  Rashes, [ ]  Wounds Psychological: [x ] Anxiety, [ ]  Depression  Physical Examination Filed Vitals:   01/21/16 1130 01/21/16 1145 01/21/16 1200 01/21/16 1215  BP: 142/100 131/104 161/98 147/106  Pulse: 93 94 89 88  Temp:      TempSrc:      Resp: 22 26 21 11   Height:      Weight:      SpO2: 97% 94% 94% 98%   Body mass index is 29.96 kg/(m^2).  General:  WDWN in NAD Gait: Normal HENT: WNL Eyes: Pupils equal Pulmonary: normal non-labored breathing , without Rales, rhonchi,  wheezing Cardiac: RRR, without  Murmurs, rubs or gallops; No carotid bruits Abdomen: soft, NT, no masses, well healed scar, positive BS throughout the abdomin Skin: no rashes, ulcers noted;  no Gangrene , no cellulitis; no open wounds;   Vascular Exam/Pulses:Palpable radial, femoral,popliteal, PT pulses bilaterally   Musculoskeletal: no muscle wasting or atrophy; no edema  Neurologic: A&O X 3; Appropriate Affect ;  SENSATION: normal; MOTOR FUNCTION: 5/5 Symmetric Speech is fluent/normal   Significant  Diagnostic Studies: CBC Lab Results  Component Value Date   WBC 5.8 01/21/2016   HGB 12.8* 01/21/2016   HCT 40.3 01/21/2016   MCV 88.2 01/21/2016   PLT 223 01/21/2016    BMET    Component Value Date/Time   NA 142 01/21/2016 1043   K 3.6 01/21/2016 1043   CL 107 01/21/2016 1043   CO2 24 01/21/2016 1043   GLUCOSE 114* 01/21/2016 1043   BUN 12 01/21/2016 1043   CREATININE 1.09 01/21/2016 1043   CREATININE 1.24 02/05/2015 1215   CALCIUM 8.5* 01/21/2016 1043   GFRNONAA >60 01/21/2016 1043   GFRNONAA 58* 02/05/2015 1215   GFRAA >60 01/21/2016 1043   GFRAA 67 02/05/2015 1215   Estimated Creatinine Clearance: 76.1 mL/min (by C-G formula based on Cr of 1.09).  COAG Lab Results  Component Value Date   INR 1.26 01/03/2016   INR 1.15 12/27/2015   INR 1.32 01/10/2015   PROTIME 18.5 05/19/2009     Non-Invasive Vascular Imaging:  CT abdomin 01/21/2016 IMPRESSION: Status post repair of an abdominal aortic aneurysm. 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  A very small amount of simple, scattered fluid in the abdomen is likely related to the patient's recent surgery. No organized fluid collection or hemorrhage is identified.  Cardiomegaly.  Fluid in the distal esophagus suggestive of reflux and/or poor motility.  Very small bilateral pleural   ASSESSMENT/PLAN:  S/P Aortic and iliac aneurysm repair with a 22 x 11 [place] aorta to bilateral common iliac artery bypass. He has N/V for 36 hours, his abdomin is soft with positive BS.  We need to consider illus verses virus.    Clinton Gallant [facility] 01/21/2016  3:22 PM  Agree with above. CT scan does not show evidence of significant ileus or SBO. Also, no pancreatitis.  Possibly viral illness. OK to eat from our standpoint. Will follow.  Waverly Ferrari, MD, FACS Beeper [phone] Office: [phone]'

## 2016-01-21 NOTE — ED Notes (Signed)
Called lab to add on differential and Troponin. Spoke with [place]

## 2016-01-22 ENCOUNTER — Encounter: Payer: Self-pay | Admitting: Nurse Practitioner

## 2016-01-22 DIAGNOSIS — I714 Abdominal aortic aneurysm, without rupture: Secondary | ICD-10-CM

## 2016-01-22 DIAGNOSIS — R1112 Projectile vomiting: Secondary | ICD-10-CM

## 2016-01-22 DIAGNOSIS — I10 Essential (primary) hypertension: Secondary | ICD-10-CM

## 2016-01-22 LAB — CBC
HEMATOCRIT: 39.8 % (ref 39.0–52.0)
HEMOGLOBIN: 12.5 g/dL — AB (ref 13.0–17.0)
MCH: 27.9 pg (ref 26.0–34.0)
MCHC: 31.4 g/dL (ref 30.0–36.0)
MCV: 88.8 fL (ref 78.0–100.0)
Platelets: 192 10*3/uL (ref 150–400)
RBC: 4.48 MIL/uL (ref 4.22–5.81)
RDW: 16.6 % — AB (ref 11.5–15.5)
WBC: 6 10*3/uL (ref 4.0–10.5)

## 2016-01-22 LAB — BASIC METABOLIC PANEL WITH GFR
Anion gap: 10 (ref 5–15)
BUN: 13 mg/dL (ref 6–20)
CO2: 24 mmol/L (ref 22–32)
Calcium: 8.2 mg/dL — ABNORMAL LOW (ref 8.9–10.3)
Chloride: 108 mmol/L (ref 101–111)
Creatinine, Ser: 1.19 mg/dL (ref 0.61–1.24)
GFR calc Af Amer: >60 mL/min
GFR calc non Af Amer: 59 mL/min — ABNORMAL LOW
Glucose, Bld: 98 mg/dL (ref 65–99)
Potassium: 3.4 mmol/L — ABNORMAL LOW (ref 3.5–5.1)
Sodium: 142 mmol/L (ref 135–145)

## 2016-01-22 LAB — HEMOGLOBIN AND HEMATOCRIT, BLOOD
HEMATOCRIT: 41.8 % (ref 39.0–52.0)
HEMOGLOBIN: 13.2 g/dL (ref 13.0–17.0)

## 2016-01-22 LAB — TROPONIN I
TROPONIN I: 0.14 ng/mL — AB (ref <0.031)
Troponin I: 0.2 ng/mL — ABNORMAL HIGH

## 2016-01-22 MED ORDER — POTASSIUM CHLORIDE CRYS ER 20 MEQ PO TBCR
40.0000 meq | EXTENDED_RELEASE_TABLET | Freq: Once | ORAL | Status: AC
  Administered 2016-01-22: 40 meq via ORAL
  Filled 2016-01-22: qty 2

## 2016-01-22 MED ORDER — PANTOPRAZOLE SODIUM 40 MG PO TBEC: 40.0000 mg | DELAYED_RELEASE_TABLET | Freq: Two times a day (BID) | ORAL | Status: DC

## 2016-01-22 NOTE — Progress Notes (Signed)
SUBJECTIVE: The patient is doing well today.  No further bleeding.  Wants to go home.  At this time, he denies chest pain, shortness of breath, or any new concerns.  . citalopram  10 mg Oral Daily  . hydrALAZINE  25 mg Oral TID  . metoprolol tartrate  12.5 mg Oral BID  . multivitamin with minerals  1 tablet Oral Daily  . niacin  1,000 mg Oral BID  . pantoprazole (PROTONIX) IV  40 mg Intravenous Q12H  . pravastatin  80 mg Oral Daily  . tiotropium  18 mcg Inhalation Daily      OBJECTIVE: Physical Exam: Filed Vitals:   01/22/16 0404 01/22/16 0734 01/22/16 1049 01/22/16 1140  BP: 128/78 147/99  122/85  Pulse: 79 71  86  Temp: 97.6 F (36.4 C) 98.2 F (36.8 C)  97.2 F (36.2 C)  TempSrc: Oral Oral  Oral  Resp: 24 23  20   Height:      Weight:      SpO2: 91% 95% 94% 99%    Intake/Output Summary (Last 24 hours) at 01/22/16 1300 Last data filed at 01/22/16 1030  Gross per 24 hour  Intake    550 ml  Output    500 ml  Net     50 ml    Currently off telemetry  GEN- The patient is well appearing, alert and oriented x 3 today.   Head- normocephalic, atraumatic Eyes-  Sclera clear, conjunctiva pink Ears- hearing intact Oropharynx- clear Neck- supple,   Lungs- Clear to ausculation bilaterally, normal work of breathing Heart- Regular rate and rhythm (paced) GI- soft, NT, ND, + BS Extremities- no clubbing, cyanosis, or edema Skin- no rash or lesion Psych- euthymic mood, full affect Neuro- strength and sensation are intact  LABS: Basic Metabolic Panel:  Recent Labs  66/29/47 1043 01/22/16 0400  NA 142 142  K 3.6 3.4*  CL 107 108  CO2 24 24  GLUCOSE 114* 98  BUN 12 13  CREATININE 1.09 1.19  CALCIUM 8.5* 8.2*   Liver Function Tests:  Recent Labs  01/21/16 1043  AST 20  ALT 14*  ALKPHOS 64  BILITOT 1.2  PROT 5.7*  ALBUMIN 2.8*    Recent Labs  01/21/16 1043  LIPASE 28   CBC:  Recent Labs  01/21/16 1043  01/22/16 0400 01/22/16 1023  WBC 5.8   --  6.0  --   NEUTROABS 4.0  --   --   --   HGB 12.8*  < > 12.5* 13.2  HCT 40.3  < > 39.8 41.8  MCV 88.2  --  88.8  --   PLT 223  --  192  --   < > = values in this interval not displayed. Cardiac Enzymes:  Recent Labs  01/21/16 1849 01/22/16 0028 01/22/16 0404  TROPONINI 0.18* 0.20* 0.14*    ASSESSMENT AND PLAN:  Active Problems:   HYPERCHOLESTEROLEMIA, PURE   HYPERTENSION, BENIGN   Atrial fibrillation (HCC)   Long term current use of anticoagulant   COPD (chronic obstructive pulmonary disease) (HCC)   Abdominal aortic aneurysm (HCC)   Chronic systolic heart failure (HCC)   Automatic implantable cardioverter-defibrillator in situ   GI bleed   Hematemesis   Projectile vomiting with nausea  1. Persistent (possibly permanent) afib Asymptomatic Now presents with GI bleed on pradaxa Per GI, ok to resume pradaxa with close follow-up. I will have him follow-up with EP NP at next available time. I worry that given his  advanced age and comorbidites that he may be at risk for recurrent GI bleeding in the future.  I think that he is a good candidate for left atrial appendage closure once he has recovered from his recent vascular surgery and current GI bleed. Procedural risks for the Watchman implant have been reviewed with the patient.  Given the patient's poor candidacy for long-term oral anticoagulation, ability to tolerate short term oral anticoagulation, I have recommended the watchman left atrial appendage closure system.  He is very interested in this.  I will therefore have him follow-up with EP NP for further discussions.  2. Chronic systolic dysfunction euvolemic No changes today  3. HTN Stable No change required today  Plans for discharge noted Cardiology to see as needed over the weekend Please call with questions    [name], MD 01/22/2016 1:00 PM

## 2016-01-22 NOTE — Progress Notes (Signed)
Patient Demographics:    [name] [name], is a 75 y.o. male, DOB - [birth year], MWU:[id number]  Admit date - 01/21/2016   Admitting Physician Starleen Arms, MD  Outpatient Primary MD for the patient is [name], MD  LOS - 1   Chief Complaint  Patient presents with  . Emesis        Subjective:    [employer] today has, No headache, No chest pain, No abdominal pain - No Nausea, No new weakness tingling or numbness, No Cough - SOB.    Assessment  & Plan :    1. Hematemesis/UGI Bleed. Likely Mallory-Weiss tear, no further episodes of the same, no blood in stool, no abdominal pain or nausea this morning, GI on board. Was on Pradaxa for A. fib which is on hold. Continue to monitor H&H which looks stable. If stable will resume Pradaxa in the morning and discharge. Mild blood loss stated anemia but no transfusion needed.  2. 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 Had minimal non-ACS pattern troponin rise due to demand ischemia, cardiology on board, currentlyonmetoprolol niacin and statin for secondary prevention. Cannot use aspirin due to #1 above. Resume Pradaxa for anticoagulation once stable from GI standpoint and cleared by GI. Stable from CHF standpoint.  3. Recent abdominal aneurysm repair. CT abdomen pelvis stable. Outpatient follow-up with vascular surgery post discharge.   4. Dyslipidemia. Continue statin.   5. Essential hypertension. Continue on beta blocker.   6. History of COPD. Stable no wheezing. Supportive care.   Code Status : Full  Family Communication  : None  Disposition Plan  : Home in  am  Consults  :  GI, Cards  Procedures  :   CT abdomen and pelvis nonacute.  DVT Prophylaxis  :   SCDs    Lab Results  Component Value Date   PLT 192 01/22/2016    Inpatient Medications  Scheduled Meds: . citalopram  10 mg Oral Daily  . hydrALAZINE  25 mg Oral TID  . metoprolol tartrate  12.5 mg Oral BID  . multivitamin with minerals  1 tablet Oral Daily  . niacin  1,000 mg Oral BID  . pantoprazole (PROTONIX) IV  40 mg Intravenous Q12H  . pravastatin  80 mg Oral Daily  . tiotropium  18 mcg Inhalation Daily   Continuous Infusions:  PRN Meds:.acetaminophen **OR** [DISCONTINUED] acetaminophen, albuterol, ipratropium-albuterol, [DISCONTINUED] ondansetron **OR** ondansetron (ZOFRAN) IV, traMADol  Antibiotics  :    Anti-infectives    None        Objective:   Filed Vitals:   01/21/16 2356 01/22/16 0221 01/22/16 0404 01/22/16 0734  BP: 135/75  128/78 147/99  Pulse: 70  79 71  Temp: 98.2 F (36.8 C)  97.6 F (36.4 C) 98.2 F (36.8 C)  TempSrc: Oral  Oral Oral  Resp: 25  24 23   Height:      Weight:      SpO2: 96% 98% 91% 95%    Wt Readings from Last 3 Encounters:  01/21/16 91.491 kg (201 lb 11.2 oz)  01/04/16 103 kg (227 lb  1.2 oz)  12/27/15 98.93 kg (218 lb 1.6 oz)     Intake/Output Summary (Last 24 hours) at 01/22/16 0935 Last data filed at 01/21/16 2357  Gross per 24 hour  Intake    310 ml  Output    500 ml  Net   -190 ml     Physical Exam  Awake Alert, Oriented X 3, No new F.N deficits, Normal affect [place].AT,PERRAL Supple Neck,No JVD, No cervical lymphadenopathy appriciated.  Symmetrical Chest wall movement, Good air movement bilaterally, CTAB RRR,No Gallops,Rubs or new Murmurs, No Parasternal Heave +ve B.Sounds, Abd Soft, No tenderness, No organomegaly appriciated, No rebound - guarding or rigidity. No Cyanosis, Clubbing or edema, No new Rash or bruise      Data Review:   Micro Results Recent Results (from the past 240 hour(s))  MRSA PCR  Screening     Status: Abnormal   Collection Time: 01/21/16  6:40 PM  Result Value Ref Range Status   MRSA by PCR POSITIVE (A) NEGATIVE Final    Comment:        The GeneXpert MRSA Assay (FDA approved for NASAL specimens only), is one component of a comprehensive MRSA colonization surveillance program. It is not intended to diagnose MRSA infection nor to guide or monitor treatment for MRSA infections. RESULT CALLED TO, READ BACK BY AND VERIFIED WITH: K.ESASOR,RN AT 2336 BY L.PITT 01/21/16     Radiology Reports Ct Abdomen Pelvis W Contrast  01/21/2016  CLINICAL DATA:  Nausea, vomiting and constipation. Status post repair of an abdominal aortic aneurysm 01/03/2016. Subsequent encounter. EXAM: CT ABDOMEN AND PELVIS WITH CONTRAST TECHNIQUE: Multidetector CT imaging of the abdomen and pelvis was performed using the standard protocol following bolus administration of intravenous contrast. CONTRAST:  100 mL OMNIPAQUE IOHEXOL 300 MG/ML  SOLN COMPARISON:  CTA abdomen and pelvis 10/19/2015 and 10/08/2009. FINDINGS: There are very small bilateral pleural effusions. No pericardial effusion is identified. Cardiomegaly is noted. Fluid is present in the visualized distal esophagus compatible with reflux and/or poor motility. The patient is status post abdominal aortic aneurysm repair. There is small amount of scattered periaortic fluid and a small amount of fluid anterior to the right common iliac artery. No rim enhancing fluid collection or soft tissue gas is identified. There is no evidence of hemorrhage. Three small hepatic cysts are unchanged. The liver is otherwise unremarkable. The gallbladder, adrenal glands and pancreas appear normal. 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 The spleen otherwise appears normal. The can't trash of the kidneys demonstrate prompt and homogeneous enhancement. No hydronephrosis or focal lesion. The urinary  bladder is unremarkable. The prostate gland is mildly prominent. The stomach and small and large bowel appear normal. There is no lymphadenopathy. Bones demonstrate postoperative change of vertebral augmentation from L1-2 L5. No lytic or sclerotic bony lesion is identified. No acute fracture. IMPRESSION: Status post repair of an abdominal aortic aneurysm. 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 A very small amount of simple, scattered fluid in the abdomen is likely related to the patient's recent surgery. No organized fluid collection or hemorrhage is identified. Cardiomegaly. Fluid in the distal esophagus suggestive of reflux and/or poor motility. Very small bilateral pleural effusions. Electronically Signed   By: Drusilla Kanner M.D.   On: 01/21/2016 13:10   Dg Chest Port 1 View  01/04/2016  CLINICAL DATA:  Aneurysm repair. EXAM: PORTABLE CHEST 1  VIEW COMPARISON:  01/03/2016. FINDINGS: Swan-Ganz catheter and NG tube in stable position. Cardiac pacer stable position. Prior CABG. Stable cardiomegaly. Slight increase in diffuse bilateral from interstitial prominence suggesting congestive heart failure. No prominent pleural effusion or pneumothorax. IMPRESSION: 1. Lines and tubes in stable position. 2. Prior CABG. Persistent cardiomegaly with slight increase in pulmonary interstitial prominence bilaterally suggesting increasing mild congestive heart failure. Electronically Signed   By: Maisie Fus  Register   On: 01/04/2016 07:41   Dg Chest Port 1 View  01/03/2016  CLINICAL DATA:  Immediate postop abdominal aortic aneurysm repair with bifurcated graft. EXAM: PORTABLE CHEST 1 VIEW COMPARISON:  09/09/2015 and earlier. FINDINGS: Right jugular Swan-Ganz catheter tip projects over the main pulmonary trunk. Nasogastric tube courses below the diaphragm into the proximal stomach. Left subclavian biventricular pacemaker unchanged and appears intact. Prior CABG. Cardiac silhouette  moderately enlarged, unchanged, allowing for differences in technique. Pulmonary venous hypertension and mild interstitial pulmonary edema. Small right pleural effusion. No confluent airspace consolidation. No pneumothorax. No mediastinal hematoma. IMPRESSION: 1. Support apparatus satisfactory. 2. Mild CHF and/or fluid overload with stable moderate cardiomegaly, mild interstitial pulmonary edema and small right pleural effusion. Electronically Signed   By: Hulan Saas M.D.   On: 01/03/2016 13:53   Dg Abd Portable 1v  01/03/2016  CLINICAL DATA:  Abdominal aortic aneurysm repair. EXAM: PORTABLE ABDOMEN - 1 VIEW COMPARISON:  None. FINDINGS: Portable supine view the abdomen at 1342 hours shows a nonspecific bowel gas pattern. Curvilinear lucency is seen along the liver, potentially along falciform ligament. Bones are diffusely demineralized. Patient is status post multilevel lumbar vertebral augmentation. NG tube tip overlies the medial stomach. IMPRESSION: Curvilinear lucency along the liver may be intraperitoneal free air outlining the liver surface. The there is clinical concern for intraperitoneal free air, lateral decubitus x-ray may prove helpful to further evaluate. Nonspecific bowel gas pattern. These results will be called to the ordering clinician or representative by the Radiologist Assistant, and communication documented in the PACS or zVision Dashboard. Electronically Signed   By: Kennith Center M.D.   On: 01/03/2016 13:52     CBC  Recent Labs Lab 01/21/16 1043 01/21/16 1849 01/22/16 0400  WBC 5.8  --  6.0  HGB 12.8* 13.2 12.5*  HCT 40.3 41.3 39.8  PLT 223  --  192  MCV 88.2  --  88.8  MCH 28.0  --  27.9  MCHC 31.8  --  31.4  RDW 16.5*  --  16.6*  LYMPHSABS 1.0  --   --   MONOABS 0.5  --   --   EOSABS 0.1  --   --   BASOSABS 0.0  --   --     Chemistries   Recent Labs Lab 01/21/16 1043 01/22/16 0400  NA 142 142  K 3.6 3.4*  CL 107 108  CO2 24 24  GLUCOSE 114* 98  BUN  12 13  CREATININE 1.09 1.19  CALCIUM 8.5* 8.2*  AST 20  --   ALT 14*  --   ALKPHOS 64  --   BILITOT 1.2  --    ------------------------------------------------------------------------------------------------------------------ No results for input(s): CHOL, HDL, LDLCALC, TRIG, CHOLHDL, LDLDIRECT in the last 72 hours.  Lab Results  Component Value Date   HGBA1C 5.5 12/24/2014   ------------------------------------------------------------------------------------------------------------------ No results for input(s): TSH, T4TOTAL, T3FREE, THYROIDAB in the last 72 hours.  Invalid input(s): FREET3 ------------------------------------------------------------------------------------------------------------------ No results for input(s): VITAMINB12, FOLATE, FERRITIN, TIBC, IRON, RETICCTPCT in the last 72 hours.  Coagulation profile  No results for input(s): INR, PROTIME in the last 168 hours.  No results for input(s): DDIMER in the last 72 hours.  Cardiac Enzymes  Recent Labs Lab 01/21/16 1849 01/22/16 0028 01/22/16 0404  TROPONINI 0.18* 0.20* 0.14*   ------------------------------------------------------------------------------------------------------------------    Component Value Date/Time   BNP 122.9* 03/04/2015 0402    Time Spent in minutes  35   SINGH,PRASHANT K M.D on 01/22/2016 at 9:35 AM  Between 7am to 7pm - Pager - [phone]  After 7pm go to [email or web address] - password [facility]  [facility] -  Office  [phone]

## 2016-01-22 NOTE — Discharge Instructions (Signed)
Follow with Primary MD [name], MD in 2 days   Get CBC, CMP, 2 view Chest X ray checked  by Primary MD next visit.    Activity: As tolerated with Full fall precautions use walker/cane & assistance as needed   Disposition Home     Diet:   Heart Healthy  .  For Heart failure patients - Check your Weight same time everyday, if you gain over 2 pounds, or you develop in leg swelling, experience more shortness of breath or chest pain, call your Primary MD immediately. Follow Cardiac Low Salt Diet and 1.5 lit/day fluid restriction.   On your next visit with your primary care physician please Get Medicines reviewed and adjusted.   Please request your Prim.MD to go over all Hospital Tests and Procedure/Radiological results at the follow up, please get all Hospital records sent to your Prim MD by signing hospital release before you go home.   If you experience worsening of your admission symptoms, develop shortness of breath, life threatening emergency, suicidal or homicidal thoughts you must seek medical attention immediately by calling 911 or calling your MD immediately  if symptoms less severe.  You Must read complete instructions/literature along with all the possible adverse reactions/side effects for all the Medicines you take and that have been prescribed to you. Take any new Medicines after you have completely understood and accpet all the possible adverse reactions/side effects.   Do not drive, operating heavy machinery, perform activities at heights, swimming or participation in water activities or provide baby sitting services if your were admitted for syncope or siezures until you have seen by Primary MD or a Neurologist and advised to do so again.  Do not drive when taking Pain medications.    Do not take more than prescribed Pain, Sleep and Anxiety Medications  Special Instructions: If you have smoked or chewed Tobacco  in the last 2 yrs please stop smoking, stop any regular  Alcohol  and or any Recreational drug use.  Wear Seat belts while driving.   Please note  You were cared for by a hospitalist during your hospital stay. If you have any questions about your discharge medications or the care you received while you were in the hospital after you are discharged, you can call the unit and asked to speak with the hospitalist on call if the hospitalist that took care of you is not available. Once you are discharged, your primary care physician will handle any further medical issues. Please note that NO REFILLS for any discharge medications will be authorized once you are discharged, as it is imperative that you return to your primary care physician (or establish a relationship with a primary care physician if you do not have one) for your aftercare needs so that they can reassess your need for medications and monitor your lab values.

## 2016-01-22 NOTE — Discharge Summary (Signed)
[name] [name], is a 75 y.o. male  DOB 1941-10-21  MRN [id number].  Admission date:  01/21/2016  Admitting Physician  Starleen Arms, MD  Discharge Date:  01/22/2016   Primary MD  [name], MD  Recommendations for primary care physician for things to follow:   Monitor H&H closely, close outpatient GI follow-up. Also follow with his primary cardiologist and vascular surgeon within a week.   Admission Diagnosis  Hematemesis with nausea [K92.0, R11.0]   Discharge Diagnosis  Hematemesis with nausea [K92.0, R11.0]      Active Problems:   HYPERCHOLESTEROLEMIA, PURE   HYPERTENSION, BENIGN   Atrial fibrillation (HCC)   Long term current use of anticoagulant   COPD (chronic obstructive pulmonary disease) (HCC)   Abdominal aortic aneurysm (HCC)   Chronic systolic heart failure (HCC)   Automatic implantable cardioverter-defibrillator in situ   GI bleed   Hematemesis      Past Medical History  Diagnosis Date  . Atrial fibrillation (HCC)     s/p prior DCCV;  Amiodarone Rx.  . CAD (coronary artery disease)     s/p CABG;  cath 10/07:  LM ok, LAD occluded, AV CFX 95%, pRCA occluded; L-LAD, S-OM2/OM3, S-PDA ok  . Cerebrovascular disease     s/p prior Left CEA;  followed by Dr. [name]  . COPD (chronic obstructive pulmonary disease) (HCC)   . Hyperlipidemia   . Hypertension   . Mitral insufficiency     hx of. not noted on echo 01/2012  . Nephrolithiasis   . Ischemic cardiomyopathy     echo 5/09: EF 55%, mild LAE;  Myoview 11/10: inf and apical scar with mild peri-infarct ischemia, EF 44%. Echo 01/26/12 with EF 20-25% but previously known to be 30% on echoes before last one  . Iliac artery aneurysm, left (HCC)     followed by Dr. [name]  . Aortic dissection (HCC)     H/O focal aortic dissection  . LBBB (left  bundle branch block)   . CHF (congestive heart failure) (HCC)   . Myocardial infarction (HCC)   . Renal insufficiency     01/2012  . Stroke (HCC)   . CVA (cerebral infarction)   . DVT (deep venous thrombosis) (HCC)   . Fall from slipping on wet surface Apr 15, 2015    Fx 3 Vertabrea  . AICD (automatic cardioverter/defibrillator) present   . Presence of permanent cardiac pacemaker   . Shortness of breath dyspnea     Past Surgical History  Procedure Laterality Date  . Uteroscopy    . Coronary artery bypass graft  1998  . Appendectomy  09/11/11  . Biventricular defibrillator implantation  08/22/12    SJM Quadra Assura BiV ICD implanted by Dr [name]  . Spine surgery  Sep 27, 2014    Lower Back L 4  and   L 5  . Bi-ventricular implantable cardioverter defibrillator N/A 08/22/2012    Procedure: BI-VENTRICULAR IMPLANTABLE CARDIOVERTER DEFIBRILLATOR  (CRT-D);  Surgeon: [name], MD;  Location: [facility] CATH LAB;  Service: Cardiovascular;  Laterality: N/A;  . Carotid endarterectomy  July  2005    left  . Cardiac catheterization  2013  . Abdominal aortic aneurysm repair N/A 01/03/2016    Procedure: ANEURYSM ABDOMINAL AORTA, BILATERAL COMMON ILIAC REPAIR;  Surgeon: [name], MD;  Location: [facility] OR;  Service: Vascular;  Laterality: N/A;       HPI  from the history and physical done on the day of admission:   [name] [name] is a 75 y.o. male, with past medical history of HTN, hyperlipidemia, COPD, atrial fibrillation on Pradaxa, history of stroke, combined diastolic and systolic congestive heart failure, AICD placement, AAA, left bundle blockage, mitral valve disorder, coronary artery disease, with recent admission by vascular surgery status post Aortic and iliac aneurysm repair with a 22 x 11 Hemashield aorta to bilateral common iliac artery bypass by Dr. [name] last month. Patient presents with complaints of nausea and vomiting over last 24 hours, denies diarrhea, abdominal pain, constipation,  patient in ED was noticed to have significant amount of hematemesis, he is on Pradaxa for A. Fib, CT abdomen pelvis with contrast showing stable postoperative AAA repair changes, hemoglobin remained stable, patient is Hemoccult negative, denies any melena, last BM before 4 days, troponin was checked, which was positive, he denies any chest pain or shortness of breath, I was called to admit      Hospital Course:     1. Hematemesis/UGI Bleed. Likely Mallory-Weiss tear, no further episodes of the same, no blood in stool, no abdominal pain or nausea this morning, GI on board. Was on Pradaxa for A. fib which was held for 1 day. Minimal blood loss stated anemia but no transfusion needed. This morning symptom-free seen by GI clear to resume Pradaxa and to be discharged home. He will be placed on PPI with outpatient GI follow-up. Request PCP to check H&H in the next 2-3 days and ensure outpatient follow-up with GI.   2. 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 Had minimal non-ACS pattern troponin rise due to demand ischemia, cardiology was on board, currentlyonmetoprolol niacin and statin for secondary prevention. Cannot use aspirin due to #1 above. Resume Pradaxa for anticoagulation per GI. Stable from CHF standpoint.   3. Recent abdominal aneurysm repair. CT abdomen pelvis stable. Outpatient follow-up with vascular surgery post discharge.   4. Dyslipidemia. Continue statin.   5. Essential hypertension. Continue on beta blocker.   6. History of COPD. Stable no wheezing. Supportive care.       Discharge Condition: Stable  Follow UP  Follow-up Information    Follow up with GRISSO,GREG, MD. Schedule an appointment as soon as possible for a visit in 2 days.   Specialty:  Internal Medicine   Contact information:   [address] [place] [address] [phone]       Follow up with Marsa Aris, MD. Schedule an appointment as soon as possible for a visit in 1 week.   Specialty:  Gastroenterology   Contact information:   [address] [place] [address] [phone]        Consults obtained - GI, Cards  Diet and Activity recommendation: See Discharge Instructions below  Discharge Instructions       Discharge Instructions    Diet - low sodium heart healthy    Complete by:  As directed      Discharge instructions    Complete by:  As directed   Follow with Primary MD [name], MD in 2 days   Get CBC, CMP, 2 view Chest X ray checked  by Primary MD next visit.    Activity: As tolerated with Full fall precautions use walker/cane & assistance as needed   Disposition Home     Diet:   Heart Healthy  .  For Heart failure patients - Check your Weight same time everyday, if you gain over 2 pounds, or you develop in leg swelling, experience more shortness of breath or chest pain, call your Primary MD immediately. Follow Cardiac Low Salt Diet and 1.5 lit/day fluid restriction.   On your next visit with your primary care physician please Get Medicines reviewed and adjusted.   Please request your Prim.MD to go over all Hospital Tests and Procedure/Radiological results at the follow up, please get all Hospital records sent to your Prim MD by signing hospital release before you go home.   If you experience worsening of your admission symptoms, develop shortness of breath, life threatening emergency, suicidal or homicidal thoughts you must seek medical attention immediately by calling 911 or calling your MD immediately  if symptoms less severe.  You Must read complete instructions/literature along with all the possible adverse reactions/side effects for all the Medicines you take and that have been prescribed to you. Take any new Medicines after you have completely understood and accpet  all the possible adverse reactions/side effects.   Do not drive, operating heavy machinery, perform activities at heights, swimming or participation in water activities or provide baby sitting services if your were admitted for syncope or siezures until you have seen by Primary MD or a Neurologist and advised to do so again.  Do not drive when taking Pain medications.    Do not take more than prescribed Pain, Sleep and Anxiety Medications  Special Instructions: If you have smoked or chewed Tobacco  in the last 2 yrs please stop smoking, stop any regular Alcohol  and or any Recreational drug use.  Wear Seat belts while driving.   Please note  You were cared for by a hospitalist during your hospital stay. If you have any questions about your discharge medications or the care you received while you were in the hospital after you are discharged, you can call the unit and asked to speak with the hospitalist on call if the hospitalist that took care of you is not available. Once you are discharged, your primary care physician will handle any further medical issues. Please note that NO REFILLS for any discharge medications will be authorized once you are discharged, as it is imperative that you return to your primary care physician (or establish a relationship with a primary care physician if you do not have one) for your aftercare needs so that they can reassess your need for medications and monitor your lab values.     Increase activity slowly    Complete by:  As directed              Discharge Medications       Medication List    TAKE these medications        albuterol 108 (90 Base) MCG/ACT inhaler  Commonly known as:  PROVENTIL HFA;VENTOLIN HFA  Inhale 2 puffs into the lungs every 4 (four) hours as needed for wheezing.     amiodarone 200 MG tablet  Commonly known as:  PACERONE  Take 200 mg by mouth daily.     amLODipine 10 MG tablet  Commonly known as:  NORVASC  Take 10 mg by  mouth daily.     citalopram 10 MG tablet  Commonly known as:  CELEXA  Take 10 mg by mouth daily.     dabigatran 150 MG Caps capsule  Commonly known as:  PRADAXA  Take 1 capsule (150 mg total) by mouth 2 (two) times daily.     furosemide 20 MG tablet  Commonly known as:  LASIX  Take 2 tablets (40 mg total) by mouth as needed for edema.     hydrALAZINE 50 MG tablet  Commonly known as:  APRESOLINE  Take 0.5 tablets (25 mg total) by mouth 3 (three) times daily.     ipratropium-albuterol 0.5-2.5 (3) MG/3ML Soln  Commonly known as:  DUONEB  Take 3 mLs by nebulization every 4 (four) hours as needed (for shortness of breath).     lisinopril 40 MG tablet  Commonly known as:  PRINIVIL,ZESTRIL  TAKE 1 TABLET EVERY DAY     metoprolol tartrate 25 MG tablet  Commonly known as:  LOPRESSOR  Take 12.5 mg by mouth 2 (two) times daily.     multivitamin with minerals Tabs tablet  Take 1 tablet by mouth daily.     niacin 500 MG CR tablet  Commonly known as:  NIASPAN  Take 1,000 mg by mouth 2 (two) times daily.     pantoprazole 40 MG tablet  Commonly known as:  PROTONIX  Take 1 tablet (40 mg total) by mouth 2 (two) times daily.     potassium chloride SA 20 MEQ tablet  Commonly known as:  K-DUR,KLOR-CON  TAKE 2 TABLETS THREE TIMES DAILY     pravastatin 80 MG tablet  Commonly known as:  PRAVACHOL  Take 1 tablet (80 mg total) by mouth daily.     tiotropium 18 MCG inhalation capsule  Commonly known as:  SPIRIVA HANDIHALER  Place 1 capsule (18 mcg total) into inhaler and inhale daily.     traMADol 50 MG tablet  Commonly known as:  ULTRAM  Take 1 tablet (50 mg total) by mouth every 6 (six) hours as needed for moderate pain.        Major procedures and Radiology Reports - PLEASE review detailed and final reports for all details, in brief -       Ct Abdomen Pelvis W Contrast  01/21/2016  CLINICAL DATA:  Nausea, vomiting and constipation. Status post repair of an abdominal aortic  aneurysm 01/03/2016. Subsequent encounter. EXAM: CT ABDOMEN AND PELVIS WITH CONTRAST TECHNIQUE: Multidetector CT imaging of the abdomen and pelvis was performed using the standard protocol following bolus administration of intravenous contrast. CONTRAST:  100 mL OMNIPAQUE IOHEXOL 300 MG/ML  SOLN COMPARISON:  CTA abdomen and pelvis 10/19/2015 and 10/08/2009. FINDINGS: There are very small bilateral pleural effusions. No pericardial effusion is identified. Cardiomegaly is noted. Fluid is present in the visualized distal esophagus compatible with reflux and/or poor motility. The patient is status post abdominal aortic aneurysm repair. There is small amount of scattered periaortic fluid and a small amount of fluid anterior to the right common iliac artery. No rim enhancing fluid collection or soft tissue gas is identified. There is no evidence of hemorrhage. Three small hepatic cysts are unchanged. The liver is otherwise unremarkable. The gallbladder, adrenal glands and pancreas appear normal. The patient has a small infarct in the posterior spleen measuring 2.0 cm which is  new since the prior studies. The spleen otherwise appears normal. The can't trash of the kidneys demonstrate prompt and homogeneous enhancement. No hydronephrosis or focal lesion. The urinary bladder is unremarkable. The prostate gland is mildly prominent. The stomach and small and large bowel appear normal. There is no lymphadenopathy. Bones demonstrate postoperative change of vertebral augmentation from L1-2 L5. No lytic or sclerotic bony lesion is identified. No acute fracture. IMPRESSION: Status post repair of an abdominal aortic aneurysm. 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 A very small amount of simple, scattered fluid in the abdomen is likely related to the patient's recent surgery. No organized fluid collection or hemorrhage is identified. Cardiomegaly. Fluid in the distal esophagus suggestive of  reflux and/or poor motility. Very small bilateral pleural effusions. Electronically Signed   By: Drusilla Kanner M.D.   On: 01/21/2016 13:10   Dg Chest Port 1 View  01/04/2016  CLINICAL DATA:  Aneurysm repair. EXAM: PORTABLE CHEST 1 VIEW COMPARISON:  01/03/2016. FINDINGS: Swan-Ganz catheter and NG tube in stable position. Cardiac pacer stable position. Prior CABG. Stable cardiomegaly. Slight increase in diffuse bilateral from interstitial prominence suggesting congestive heart failure. No prominent pleural effusion or pneumothorax. IMPRESSION: 1. Lines and tubes in stable position. 2. Prior CABG. Persistent cardiomegaly with slight increase in pulmonary interstitial prominence bilaterally suggesting increasing mild congestive heart failure. Electronically Signed   By: Maisie Fus  Register   On: 01/04/2016 07:41   Dg Chest Port 1 View  01/03/2016  CLINICAL DATA:  Immediate postop abdominal aortic aneurysm repair with bifurcated graft. EXAM: PORTABLE CHEST 1 VIEW COMPARISON:  09/09/2015 and earlier. FINDINGS: Right jugular Swan-Ganz catheter tip projects over the main pulmonary trunk. Nasogastric tube courses below the diaphragm into the proximal stomach. Left subclavian biventricular pacemaker unchanged and appears intact. Prior CABG. Cardiac silhouette moderately enlarged, unchanged, allowing for differences in technique. Pulmonary venous hypertension and mild interstitial pulmonary edema. Small right pleural effusion. No confluent airspace consolidation. No pneumothorax. No mediastinal hematoma. IMPRESSION: 1. Support apparatus satisfactory. 2. Mild CHF and/or fluid overload with stable moderate cardiomegaly, mild interstitial pulmonary edema and small right pleural effusion. Electronically Signed   By: Hulan Saas M.D.   On: 01/03/2016 13:53   Dg Abd Portable 1v  01/03/2016  CLINICAL DATA:  Abdominal aortic aneurysm repair. EXAM: PORTABLE ABDOMEN - 1 VIEW COMPARISON:  None. FINDINGS: Portable supine  view the abdomen at 1342 hours shows a nonspecific bowel gas pattern. Curvilinear lucency is seen along the liver, potentially along falciform ligament. Bones are diffusely demineralized. Patient is status post multilevel lumbar vertebral augmentation. NG tube tip overlies the medial stomach. IMPRESSION: Curvilinear lucency along the liver may be intraperitoneal free air outlining the liver surface. The there is clinical concern for intraperitoneal free air, lateral decubitus x-ray may prove helpful to further evaluate. Nonspecific bowel gas pattern. These results will be called to the ordering clinician or representative by the Radiologist Assistant, and communication documented in the PACS or zVision Dashboard. Electronically Signed   By: Kennith Center M.D.   On: 01/03/2016 13:52    Micro Results      Recent Results (from the past 240 hour(s))  MRSA PCR Screening     Status: Abnormal   Collection Time: 01/21/16  6:40 PM  Result Value Ref Range Status   MRSA by PCR POSITIVE (A) NEGATIVE Final    Comment:        The GeneXpert MRSA Assay (FDA approved for NASAL  specimens only), is one component of a comprehensive MRSA colonization surveillance program. It is not intended to diagnose MRSA infection nor to guide or monitor treatment for MRSA infections. RESULT CALLED TO, READ BACK BY AND VERIFIED WITH: K.ESASOR,RN AT 2336 BY L.PITT 01/21/16        Today   Subjective    [name] [name] today has no headache,no chest abdominal pain,no new weakness tingling or numbness, feels much better wants to go home today.     Objective   Blood pressure 147/99, pulse 71, temperature 98.2 F (36.8 C), temperature source Oral, resp. rate 23, height 6\' 1"  (1.854 m), weight 91.491 kg (201 lb 11.2 oz), SpO2 94 %.   Intake/Output Summary (Last 24 hours) at 01/22/16 1143 Last data filed at 01/21/16 2357  Gross per 24 hour  Intake    310 ml  Output    500 ml  Net   -190 ml    Exam Awake  Alert, Oriented x 3, No new F.N deficits, Normal affect [place].AT,PERRAL Supple Neck,No JVD, No cervical lymphadenopathy appriciated.  Symmetrical Chest wall movement, Good air movement bilaterally, CTAB RRR,No Gallops,Rubs or new Murmurs, No Parasternal Heave +ve B.Sounds, Abd Soft, Non tender, No organomegaly appriciated, No rebound -guarding or rigidity. No Cyanosis, Clubbing or edema, No new Rash or bruise   Data Review   CBC w Diff: Lab Results  Component Value Date   WBC 6.0 01/22/2016   HGB 13.2 01/22/2016   HCT 41.8 01/22/2016   PLT 192 01/22/2016   LYMPHOPCT 18 01/21/2016   MONOPCT 8 01/21/2016   EOSPCT 2 01/21/2016   BASOPCT 1 01/21/2016    CMP: Lab Results  Component Value Date   NA 142 01/22/2016   K 3.4* 01/22/2016   CL 108 01/22/2016   CO2 24 01/22/2016   BUN 13 01/22/2016   CREATININE 1.19 01/22/2016   CREATININE 1.24 02/05/2015   PROT 5.7* 01/21/2016   ALBUMIN 2.8* 01/21/2016   BILITOT 1.2 01/21/2016   ALKPHOS 64 01/21/2016   AST 20 01/21/2016   ALT 14* 01/21/2016  .   Total Time in preparing paper work, data evaluation and todays exam - 35 minutes  Leroy Sea M.D on 01/22/2016 at 11:43 AM  [facility]   Office  [phone]

## 2016-01-22 NOTE — Progress Notes (Signed)
   VASCULAR SURGERY ASSESSMENT & PLAN:  * No further nausea and vomiting. No evidence of bowel obstruction, ileus, or pancreatitis.  * Vascular surgery will follow peripherally.   SUBJECTIVE: Hungry. Once to eat. Passing flatus. No BM.  PHYSICAL EXAM: Filed Vitals:   01/21/16 2356 01/22/16 0221 01/22/16 0404 01/22/16 0734  BP: 135/75  128/78 147/99  Pulse: 70  79 71  Temp: 98.2 F (36.8 C)  97.6 F (36.4 C) 98.2 F (36.8 C)  TempSrc: Oral  Oral Oral  Resp: 25  24 23   Height:      Weight:      SpO2: 96% 98% 91% 95%   Abdomen with normal pitch bowel sounds. Dominant incision looks fine. Feet are warm and well-perfused.  LABS: Lab Results  Component Value Date   WBC 6.0 01/22/2016   HGB 12.5* 01/22/2016   HCT 39.8 01/22/2016   MCV 88.8 01/22/2016   PLT 192 01/22/2016   Lab Results  Component Value Date   CREATININE 1.19 01/22/2016   Lab Results  Component Value Date   INR 1.26 01/03/2016   PROTIME 18.5 05/19/2009   Active Problems:   HYPERCHOLESTEROLEMIA, PURE   HYPERTENSION, BENIGN   Atrial fibrillation (HCC)   Long term current use of anticoagulant   COPD (chronic obstructive pulmonary disease) (HCC)   Abdominal aortic aneurysm (HCC)   Chronic systolic heart failure (HCC)   Automatic implantable cardioverter-defibrillator in situ   GI bleed   Hematemesis   Cari Caraway Beeper: [phone] 01/22/2016

## 2016-01-22 NOTE — Progress Notes (Signed)
Initial Nutrition Assessment  DOCUMENTATION CODES:   Severe malnutrition in context of chronic illness  INTERVENTION:   Ensure Enlive po BID, each supplement provides 350 kcal and 20 grams of protein Reviewed protein foods and encouraged 3 meals per day.   NUTRITION DIAGNOSIS:   Malnutrition related to chronic illness as evidenced by energy intake < or equal to 75% for > or equal to 1 month, 19 percent weight loss x 5 months, severe depletion of muscle mass.  GOAL:   Patient will meet greater than or equal to 90% of their needs  MONITOR:   PO intake, Supplement acceptance, I & O's, Weight trends  REASON FOR ASSESSMENT:   Malnutrition Screening Tool   ASSESSMENT:   [name] [name] is a 75 y.o. male, with past medical history of HTN, hyperlipidemia, COPD, atrial fibrillation on Pradaxa, history of stroke, combined diastolic and systolic congestive heart failure, AICD placement, AAA, left bundle blockage, mitral valve disorder, coronary artery disease, with recent admission by vascular surgery status post Aortic and iliac aneurysm repair with a 22 x 11 Hemashield aorta to bilateral common iliac artery bypass by Dr. [name] last month.  Medications reviewed and include: MVI Labs reviewed: potassium low 3.4 Troponin elevated Nutrition-Focused physical exam completed. Findings are moderate fat depletion, severe muscle depletion, and mild edema.    Per pt his usual weight is 247 lb. He has lost 19% of his weight in 5 1/2 months.  He was discharged home 2 weeks ago and his appetite had not yet returned. 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 He only eats 1 sometimes 2 meals per day. He is happy about his weight loss and does not want to gain weight. Discussed importance of avoiding unintentional weight loss. Encouraged protein intake, reviewed food sources. Encouraged 3 small meals per day. Discussed using ensure as needed. Pt is willing to try ensure.     Diet Order:  DIET SOFT Room service appropriate?: Yes; Fluid consistency:: Thin Diet - low sodium heart healthy  Skin:  Reviewed, no issues  Last BM:  2/7  Height:   Ht Readings from Last 1 Encounters:  01/21/16 6\' 1"  (1.854 m)   Weight:   Wt Readings from Last 1 Encounters:  01/21/16 201 lb 11.2 oz (91.491 kg)   Ideal Body Weight:  83.6 kg  BMI:  Body mass index is 26.62 kg/(m^2).  Estimated Nutritional Needs:   Kcal:  2000-2200  Protein:  105-115 grams  Fluid:  > 2 L/day  EDUCATION NEEDS:   Education needs addressed  Kendell Bane RD, LDN, CNSC [phone] Pager [phone] After Hours Pager

## 2016-01-22 NOTE — Progress Notes (Signed)
[facility] Progress Note  Subjective:  No abd pain. No N/V this am.Hgb stable--13.2.Feels well. Tol soft diet. Wants to go home.   Objective:  Vital signs in last 24 hours: Temp:  [97.6 F (36.4 C)-98.5 F (36.9 C)] 98.2 F (36.8 C) (02/11 0734) Pulse Rate:  [56-99] 71 (02/11 0734) Resp:  [11-26] 23 (02/11 0734) BP: (128-163)/(75-106) 147/99 mmHg (02/11 0734) SpO2:  [91 %-99 %] 95 % (02/11 0734) Weight:  [201 lb 11.2 oz (91.491 kg)] 201 lb 11.2 oz (91.491 kg) (02/10 1800) Last BM Date: 01/18/16 General:   Alert,  Well-developed,    in NAD Heart:  Regular rate and rhythm; no murmurs Pulm;lungs clear Abdomen:  Soft, nontender and nondistended. Normal bowel sounds.   Extremities:  Without edema. t.  Intake/Output from previous day: 02/10 0701 - 02/11 0700 In: 810 [P.O.:240; I.V.:570] Out: 500 [Urine:500] Intake/Output this shift:    Lab Results:  Recent Labs  01/21/16 1043 01/21/16 1849 01/22/16 0400 01/22/16 1023  WBC 5.8  --  6.0  --   HGB 12.8* 13.2 12.5* 13.2  HCT 40.3 41.3 39.8 41.8  PLT 223  --  192  --    BMET  Recent Labs  01/21/16 1043 01/22/16 0400  NA 142 142  K 3.6 3.4*  CL 107 108  CO2 24 24  GLUCOSE 114* 98  BUN 12 13  CREATININE 1.09 1.19  CALCIUM 8.5* 8.2*   LFT  Recent Labs  01/21/16 1043  PROT 5.7*  ALBUMIN 2.8*  AST 20  ALT 14*  ALKPHOS 64  BILITOT 1.2    Ct Abdomen Pelvis W Contrast  01/21/2016  CLINICAL DATA:  Nausea, vomiting and constipation. Status post repair of an abdominal aortic aneurysm 01/03/2016. Subsequent encounter. EXAM: CT ABDOMEN AND PELVIS WITH CONTRAST TECHNIQUE: Multidetector CT imaging of the abdomen and pelvis was performed using the standard protocol following bolus administration of intravenous contrast. CONTRAST:  100 mL OMNIPAQUE IOHEXOL 300 MG/ML  SOLN COMPARISON:  CTA abdomen and pelvis 10/19/2015 and 10/08/2009. FINDINGS: There are very small bilateral pleural effusions. No  pericardial effusion is identified. Cardiomegaly is noted. Fluid is present in the visualized distal esophagus compatible with reflux and/or poor motility. The patient is status post abdominal aortic aneurysm repair. There is small amount of scattered periaortic fluid and a small amount of fluid anterior to the right common iliac artery. No rim enhancing fluid collection or soft tissue gas is identified. There is no evidence of hemorrhage. Three small hepatic cysts are unchanged. The liver is otherwise unremarkable. The gallbladder, adrenal glands and pancreas appear normal. 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 The spleen otherwise appears normal. The can't trash of the kidneys demonstrate prompt and homogeneous enhancement. No hydronephrosis or focal lesion. The urinary bladder is unremarkable. The prostate gland is mildly prominent. The stomach and small and large bowel appear normal. There is no lymphadenopathy. Bones demonstrate postoperative change of vertebral augmentation from L1-2 L5. No lytic or sclerotic bony lesion is identified. No acute fracture. IMPRESSION: Status post repair of an abdominal aortic aneurysm. 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 A very small amount of simple, scattered fluid in the abdomen is likely related to the patient's recent surgery. No organized fluid collection or hemorrhage is identified. Cardiomegaly. Fluid in the distal esophagus suggestive of reflux and/or poor motility. Very small bilateral  pleural effusions. Electronically Signed   By: Drusilla Kanner M.D.   On: 01/21/2016 13:10    ASSESSMENT/PLAN:  75 yo male  S/p episode of  small to moderate volume hematemesis, appers to have occurred after multiple incidences of non-bloody emesis.Hgb stable. No further N/V.Monitor H/H. If stable in am, can likely go home. Pradaxa restart will be per hospitalist  service.   Active Problems:   HYPERCHOLESTEROLEMIA, PURE   HYPERTENSION, BENIGN   Atrial fibrillation (HCC)   Long term current use of anticoagulant   COPD (chronic obstructive pulmonary disease) (HCC)   Abdominal aortic aneurysm (HCC)   Chronic systolic heart failure (HCC)   Automatic implantable cardioverter-defibrillator in situ   GI bleed   Hematemesis     LOS: 1 day   Hvozdovic, Lori P PA-C 01/22/2016, Pager [phone] Mon-Fri 8a-5p [phone] after 5p, weekends, holidays   Attending physician's note   I have taken an interval history, reviewed the chart and examined the patient. I agree with the Advanced Practitioner's note, impression and recommendations.  No further nausea or vomiting. Small volume hematemesis likely secondary to ?mallory weis tear vs esophagitis in the setting of recurrent vomiting due to ?viral gastroenteritis Hgb stable Ok to restart Pradaxa Ok to discharge home from GI perspective Will arrange for outpatient GI follow up  Iona Beard, MD [phone] Mon-Fri 8a-5p [phone] after 5p, weekends, holidays

## 2016-01-24 ENCOUNTER — Telehealth: Payer: Self-pay

## 2016-01-24 NOTE — Telephone Encounter (Signed)
-----   Message from Napoleon Form, MD sent at 01/22/2016  6:14 PM EST ----- Needs follow up visit in office  Thanks VN

## 2016-01-24 NOTE — Telephone Encounter (Signed)
Spoke with the patient. Scheduled for a hospital follow up 01/27/16 at 1:15 pm. Appointment card mailed per patient's request.

## 2016-01-25 ENCOUNTER — Ambulatory Visit (INDEPENDENT_AMBULATORY_CARE_PROVIDER_SITE_OTHER): Payer: Medicare Other | Admitting: Physician Assistant

## 2016-01-25 ENCOUNTER — Other Ambulatory Visit (INDEPENDENT_AMBULATORY_CARE_PROVIDER_SITE_OTHER): Payer: Medicare Other

## 2016-01-25 ENCOUNTER — Encounter: Payer: Self-pay | Admitting: Vascular Surgery

## 2016-01-25 ENCOUNTER — Ambulatory Visit (INDEPENDENT_AMBULATORY_CARE_PROVIDER_SITE_OTHER): Payer: Medicare Other | Admitting: Vascular Surgery

## 2016-01-25 ENCOUNTER — Ambulatory Visit (INDEPENDENT_AMBULATORY_CARE_PROVIDER_SITE_OTHER): Payer: Medicare Other

## 2016-01-25 ENCOUNTER — Encounter: Payer: Self-pay | Admitting: Physician Assistant

## 2016-01-25 VITALS — BP 147/87 | HR 109 | Temp 97.1°F | Resp 18 | Ht 73.0 in | Wt 206.0 lb

## 2016-01-25 VITALS — BP 148/66 | HR 64 | Ht 73.0 in | Wt 204.4 lb

## 2016-01-25 DIAGNOSIS — I5022 Chronic systolic (congestive) heart failure: Secondary | ICD-10-CM

## 2016-01-25 DIAGNOSIS — Z7901 Long term (current) use of anticoagulants: Secondary | ICD-10-CM | POA: Diagnosis not present

## 2016-01-25 DIAGNOSIS — Z8719 Personal history of other diseases of the digestive system: Secondary | ICD-10-CM | POA: Diagnosis not present

## 2016-01-25 DIAGNOSIS — I714 Abdominal aortic aneurysm, without rupture, unspecified: Secondary | ICD-10-CM

## 2016-01-25 DIAGNOSIS — I255 Ischemic cardiomyopathy: Secondary | ICD-10-CM

## 2016-01-25 DIAGNOSIS — I4891 Unspecified atrial fibrillation: Secondary | ICD-10-CM

## 2016-01-25 DIAGNOSIS — Z95810 Presence of automatic (implantable) cardiac defibrillator: Secondary | ICD-10-CM

## 2016-01-25 LAB — CBC WITH DIFFERENTIAL/PLATELET
BASOS PCT: 0.5 % (ref 0.0–3.0)
Basophils Absolute: 0 10*3/uL (ref 0.0–0.1)
EOS PCT: 1.4 % (ref 0.0–5.0)
Eosinophils Absolute: 0.1 10*3/uL (ref 0.0–0.7)
HEMATOCRIT: 42.4 % (ref 39.0–52.0)
HEMOGLOBIN: 13.7 g/dL (ref 13.0–17.0)
LYMPHS PCT: 16.3 % (ref 12.0–46.0)
Lymphs Abs: 1.1 10*3/uL (ref 0.7–4.0)
MCHC: 32.2 g/dL (ref 30.0–36.0)
MCV: 87.4 fl (ref 78.0–100.0)
Monocytes Absolute: 0.7 10*3/uL (ref 0.1–1.0)
Monocytes Relative: 10 % (ref 3.0–12.0)
Neutro Abs: 4.8 10*3/uL (ref 1.4–7.7)
Neutrophils Relative %: 71.8 % (ref 43.0–77.0)
Platelets: 206 10*3/uL (ref 150.0–400.0)
RBC: 4.85 Mil/uL (ref 4.22–5.81)
RDW: 17.5 % — AB (ref 11.5–15.5)
WBC: 6.6 10*3/uL (ref 4.0–10.5)

## 2016-01-25 NOTE — Progress Notes (Signed)
Patient name: [name] [name] MRN: [id number] DOB: [birth year] Sex: male  REASON FOR VISIT: Follow-up of open aneurysm repair 01/03/2016  HPI: [name] [name] is a 75 y.o. male urine today for follow-up of open aneurysm repair. Did well in the hospital and was discharged home. Does have the usual diminished stamina and appetite. He reports that he is returning back to baseline. He did have an episode on 01/20/2010 where he presented to the emergency room with nausea and constipation. He did undergo CT scan at that time and saw Dr. Edilia [name] who felt that he was recovering nicely. I did review his CT films and this does show excellent status post repair of aorta by common iliac graft.  Current Outpatient Prescriptions  Medication Sig Dispense Refill  . albuterol (PROVENTIL HFA;VENTOLIN HFA) 108 (90 BASE) MCG/ACT inhaler Inhale 2 puffs into the lungs every 4 (four) hours as needed for wheezing. 1 Inhaler 0  . amiodarone (PACERONE) 200 MG tablet Take 200 mg by mouth daily.    [name] amLODipine (NORVASC) 10 MG tablet Take 10 mg by mouth daily.    . citalopram (CELEXA) 10 MG tablet Take 10 mg by mouth daily.    . dabigatran (PRADAXA) 150 MG CAPS capsule Take 1 capsule (150 mg total) by mouth 2 (two) times daily. 180 capsule 3  . furosemide (LASIX) 20 MG tablet Take 2 tablets (40 mg total) by mouth as needed for edema. 60 tablet 12  . hydrALAZINE (APRESOLINE) 50 MG tablet Take 0.5 tablets (25 mg total) by mouth 3 (three) times daily. 135 tablet 3  . ipratropium-albuterol (DUONEB) 0.5-2.5 (3) MG/3ML SOLN Take 3 mLs by nebulization every 4 (four) hours as needed (for shortness of breath). 360 mL 5  . lisinopril (PRINIVIL,ZESTRIL) 40 MG tablet TAKE 1 TABLET EVERY DAY 90 tablet 3  . metoprolol tartrate (LOPRESSOR) 25 MG tablet Take 12.5 mg by mouth 2 (two) times daily.    . Multiple Vitamin (MULITIVITAMIN WITH MINERALS) TABS Take 1 tablet by mouth daily.    . niacin (NIASPAN) 500 MG CR tablet Take 1,000 mg  by mouth 2 (two) times daily.     . pantoprazole (PROTONIX) 40 MG tablet Take 1 tablet (40 mg total) by mouth 2 (two) times daily. 60 tablet 0  . potassium chloride SA (K-DUR,KLOR-CON) 20 MEQ tablet TAKE 2 TABLETS THREE TIMES DAILY 540 tablet 0  . pravastatin (PRAVACHOL) 80 MG tablet Take 1 tablet (80 mg total) by mouth daily. 90 tablet 2  . tiotropium (SPIRIVA HANDIHALER) 18 MCG inhalation capsule Place 1 capsule (18 mcg total) into inhaler and inhale daily. 30 capsule 2  . traMADol (ULTRAM) 50 MG tablet Take 1 tablet (50 mg total) by mouth every 6 (six) hours as needed for moderate pain. (Patient not taking: Reported on 01/25/2016) 30 tablet 0   No current facility-administered medications for this visit.     PHYSICAL EXAM: Filed Vitals:   01/25/16 0950 01/25/16 0956  BP: 155/94 147/87  Pulse: 116 109  Temp: 97.1 F (36.2 C)   Resp: 18   Height:  (1.854 m)   Weight: 206 lb (93.441 kg)   SpO2: 99%     GENERAL: The patient is a well-nourished male, in no acute distress. The vital signs are documented above. 2+ radial 2+ dorsalis pedis pulses bilaterally Abdomen with a well-healed midline incision and no evidence of hernia   MEDICAL ISSUES: Stable status post open aneurysmNICHOLOS ALOISIcontinue usual activities aside from heavy lifting.  We'll see him again in 3 months for continued follow-up  [name] [name] [facility] [employer]: [phone]

## 2016-01-25 NOTE — Progress Notes (Signed)
EPIC Encounter for ICM Monitoring  Patient Name: [name] [name] is a 75 y.o. male Date: 01/25/2016 Primary Care Physican: [name], MD Primary Cardiologist: [name] Electrophysiologist: [name] Dry Weight: 204 lbs  Bi-V Pacing 22% (was 92% on 11/15/2015)       In the past month, have you:  1. Gained more than 2 pounds in a day or more than 5 pounds in a week? no  2. Had changes in your medications (with verification of current medications)? no  3. Had more shortness of breath than is usual for you? no  4. Limited your activity because of shortness of breath? no  5. Not been able to sleep because of shortness of breath? no  6. Had increased swelling in your feet or ankles? no  7. Had symptoms of dehydration (dizziness, dry mouth, increased thirst, decreased urine output) no  8. Had changes in sodium restriction? no  9. Been compliant with medication? Yes   ICM trend: 3 month view for 01/24/2016    ICM trend: 1 year view for 01/24/2016   Follow-up plan: ICM clinic phone appointment on 03/27/2016 and appointment with Gypsy Balsam, NP on 02/23/2016 and Dr [name] on 02/24/2016.  [place] thoracic impedance below reference line from  ~12/27/2015 to 01/12/2016 suggesting fluid and it correlate with hospitalization for AAA repair.  Thoracic impedance above reference line ~01/14/2016 to 01/21/2016 suggesting dryness which correlates with hospitalization for nausea and vomiting.  He reported he is feeling better now.  He denied any SOB, palpitations, dizziness, chest pain or weakness.  He reported some fatigue but that is improving.  He has taken a couple of doses of prn Furosemide in the last couple of weeks.  No changes today.  Reviewed 01/24/2016 transmission with Gypsy Balsam, NP in the office regarding decrease of BiV pacing and AT/AF burden increased from 4.8 % on 01/25/2016 to >99% on 01/24/2016.  She will follow up with his at his March appointment.    Advised would send to Dr [name]  and Dr [name] for review and will call back if any recommendations.    Copy of note sent to patient's primary care physician, primary cardiologist, and device following physician.  Karie Soda, RN, CCM 01/25/2016 2:11 PM

## 2016-01-25 NOTE — Progress Notes (Addendum)
Reviewed and agree with documentation and assessment and plan. Will need to schedule for EGD to r/o any other etiology. Will discuss with cardiology if ok to hold anticoagulation for the procedure. Iona Beard , MD

## 2016-01-25 NOTE — Patient Instructions (Signed)
Your physician has requested that you go to the basement for lab work before leaving today.  Continue Pantoprazole twice a day for one month then decrease to once daily.   Follow up with Dr. Lavon Paganini in 2 months.

## 2016-01-25 NOTE — Progress Notes (Signed)
Filed Vitals:   01/25/16 0950 01/25/16 0956  BP: 155/94 147/87  Pulse: 116 109  Temp: 97.1 F (36.2 C)   Resp: 18   Height: 6\' 1"  (1.854 m)   Weight: 206 lb (93.441 kg)   SpO2: 99%

## 2016-01-25 NOTE — Progress Notes (Signed)
Patient ID: [name] [name], male   DOB: 1941-04-11, 75 y.o.   MRN: [id number]     History of Present Illness: [name] [name] is a delightful 75 year old male with past medical history hypertension, hyperlipidemia, COPD, atrial fibrillation on Pradaxa, history of stroke, combined diastolic and systolic heart failure, AICD placement, AAA, left bundle branch block, mitral valve disorder, coronary artery disease, who is status post a recent admission by vascular surgery with aortic and iliac aneurysm repair with a 22 x 11 cm he may she'll aorta to bilateral common iliac artery bypass. He presented to the emergency room on February 10 with nausea and vomiting of 24 hours. No diarrhea or abdominal pain. In the emergency room he was noted to have hematemesis. Hemoglobin was 12.8. He was admitted for observation and had no further nausea or vomiting while in house. It was felt he likely had a Mallory Weiss tear. No blood in his stool was noted and he had no abdominal pain. He was discharged home on the 11th at which time he was symptom-free. He was advised to continue twice a day pantoprazole. He returns today stating that he feels well he has no abdominal pain. He is moving his bowels regularly he's had no bright red blood per rectum or melena. He's had no further vomiting or hematemesis.    Past Medical History  Diagnosis Date  . Atrial fibrillation (HCC)     s/p prior DCCV;  Amiodarone Rx.  . CAD (coronary artery disease)     s/p CABG;  cath 10/07:  LM ok, LAD occluded, AV CFX 95%, pRCA occluded; L-LAD, S-OM2/OM3, S-PDA ok  . Cerebrovascular disease     s/p prior Left CEA;  followed by Dr. [name]  . COPD (chronic obstructive pulmonary disease) (HCC)   . Hyperlipidemia   . Hypertension   . Mitral insufficiency     hx of. not noted on echo 01/2012  . Nephrolithiasis   . Ischemic cardiomyopathy     echo 5/09: EF 55%, mild LAE;  Myoview 11/10: inf and apical scar with mild peri-infarct ischemia, EF  44%. Echo 01/26/12 with EF 20-25% but previously known to be 30% on echoes before last one  . Iliac artery aneurysm, left (HCC)     followed by Dr. [name]  . Aortic dissection (HCC)     H/O focal aortic dissection  . LBBB (left bundle branch block)   . CHF (congestive heart failure) (HCC)   . Myocardial infarction (HCC)   . Renal insufficiency     01/2012  . Stroke (HCC)   . CVA (cerebral infarction)   . DVT (deep venous thrombosis) (HCC)   . Fall from slipping on wet surface Apr 15, 2015    Fx 3 Vertabrea  . AICD (automatic cardioverter/defibrillator) present     Past Surgical History  Procedure Laterality Date  . Uteroscopy    . Coronary artery bypass graft  1998  . Appendectomy  09/11/11  . Biventricular defibrillator implantation  08/22/12    SJM Quadra Assura BiV ICD implanted by Dr [name]  . Spine surgery  Sep 27, 2014    Lower Back L 4  and   L 5  . Bi-ventricular implantable cardioverter defibrillator N/A 08/22/2012    Procedure: BI-VENTRICULAR IMPLANTABLE CARDIOVERTER DEFIBRILLATOR  (CRT-D);  Surgeon: [name], MD;  Location: [facility] CATH LAB;  Service: Cardiovascular;  Laterality: N/A;  . Carotid endarterectomy  July  2005    left  . Cardiac catheterization  2013  .  Abdominal aortic aneurysm repair N/A 01/03/2016    Procedure: ANEURYSM ABDOMINAL AORTA, BILATERAL COMMON ILIAC REPAIR;  Surgeon: [name], MD;  Location: [facility] OR;  Service: Vascular;  Laterality: N/A;   Family History  Problem Relation Age of Onset  . Stroke Maternal Uncle    Social History  Substance Use Topics  . Smoking status: Former Smoker -- 1.50 packs/day for 57 years    Types: Cigarettes    Quit date: 12/11/2004  . Smokeless tobacco: Never Used  . Alcohol Use: No     Comment: 6 yrs ago    Current Outpatient Prescriptions  Medication Sig Dispense Refill  . albuterol (PROVENTIL HFA;VENTOLIN HFA) 108 (90 BASE) MCG/ACT inhaler Inhale 2 puffs into the lungs every 4 (four) hours as needed for  wheezing. 1 Inhaler 0  . amiodarone (PACERONE) 200 MG tablet Take 200 mg by mouth daily.    [name] amLODipine (NORVASC) 10 MG tablet Take 10 mg by mouth daily.    . citalopram (CELEXA) 10 MG tablet Take 10 mg by mouth daily.    . dabigatran (PRADAXA) 150 MG CAPS capsule Take 1 capsule (150 mg total) by mouth 2 (two) times daily. 180 capsule 3  . furosemide (LASIX) 20 MG tablet Take 2 tablets (40 mg total) by mouth as needed for edema. 60 tablet 12  . hydrALAZINE (APRESOLINE) 50 MG tablet Take 0.5 tablets (25 mg total) by mouth 3 (three) times daily. 135 tablet 3  . ipratropium-albuterol (DUONEB) 0.5-2.5 (3) MG/3ML SOLN Take 3 mLs by nebulization every 4 (four) hours as needed (for shortness of breath). 360 mL 5  . lisinopril (PRINIVIL,ZESTRIL) 40 MG tablet TAKE 1 TABLET EVERY DAY 90 tablet 3  . metoprolol tartrate (LOPRESSOR) 25 MG tablet Take 12.5 mg by mouth 2 (two) times daily.    . Multiple Vitamin (MULITIVITAMIN WITH MINERALS) TABS Take 1 tablet by mouth daily.    . niacin (NIASPAN) 500 MG CR tablet Take 1,000 mg by mouth 2 (two) times daily.     . pantoprazole (PROTONIX) 40 MG tablet Take 1 tablet (40 mg total) by mouth 2 (two) times daily. 60 tablet 0  . potassium chloride SA (K-DUR,KLOR-CON) 20 MEQ tablet TAKE 2 TABLETS THREE TIMES DAILY 540 tablet 0  . pravastatin (PRAVACHOL) 80 MG tablet Take 1 tablet (80 mg total) by mouth daily. 90 tablet 2  . tiotropium (SPIRIVA HANDIHALER) 18 MCG inhalation capsule Place 1 capsule (18 mcg total) into inhaler and inhale daily. 30 capsule 2  . traMADol (ULTRAM) 50 MG tablet Take 1 tablet (50 mg total) by mouth every 6 (six) hours as needed for moderate pain. 30 tablet 0   No current facility-administered medications for this visit.   Allergies  Allergen Reactions  . Morphine And Related Other (See Comments)    Patient stated that he did not do well on this medication. He felt like climbing the walls     Review of Systems: Gen: Denies any fever,  chills, sweats, anorexia, fatigue, weakness, malaise, weight loss, and sleep disorder CV: Denies chest pain, angina, palpitations, syncope, orthopnea, PND, peripheral edema, and claudication. Resp: Denies dyspnea at rest, dyspnea with exercise, cough, sputum, wheezing, coughing up blood, and pleurisy. GI: Denies vomiting blood, jaundice, and fecal incontinence.   Denies dysphagia or odynophagia. GU : Denies urinary burning, blood in urine, urinary frequency, urinary hesitancy, nocturnal urination, and urinary incontinence. MS: Denies joint pain, limitation of movement, and swelling, stiffness, low back pain, extremity pain. Denies muscle weakness,  cramps, atrophy.  Derm: Denies rash, itching, dry skin, hives, moles, warts, or unhealing ulcers.  Psych: Denies depression, anxiety, memory loss, suicidal ideation, hallucinations, paranoia, and confusion. Heme: Denies bruising, bleeding, and enlarged lymph nodes. Neuro:  Denies any headaches, dizziness, paresthesia Endo:  Denies any problems with DM, thyroid, adrenal  LAB RESULTS:  Recent Labs  01/22/16 1023  HGB 13.2  HCT 41.8      Studies:   Ct Abdomen Pelvis W Contrast  01/21/2016  CLINICAL DATA:  Nausea, vomiting and constipation. Status post repair of an abdominal aortic aneurysm 01/03/2016. Subsequent encounter. EXAM: CT ABDOMEN AND PELVIS WITH CONTRAST TECHNIQUE: Multidetector CT imaging of the abdomen and pelvis was performed using the standard protocol following bolus administration of intravenous contrast. CONTRAST:  100 mL OMNIPAQUE IOHEXOL 300 MG/ML  SOLN COMPARISON:  CTA abdomen and pelvis 10/19/2015 and 10/08/2009. FINDINGS: There are very small bilateral pleural effusions. No pericardial effusion is identified. Cardiomegaly is noted. Fluid is present in the visualized distal esophagus compatible with reflux and/or poor motility. The patient is status post abdominal aortic aneurysm repair. There is small amount of scattered  periaortic fluid and a small amount of fluid anterior to the right common iliac artery. No rim enhancing fluid collection or soft tissue gas is identified. There is no evidence of hemorrhage. Three small hepatic cysts are unchanged. The liver is otherwise unremarkable. The gallbladder, adrenal glands and pancreas appear normal. 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 The spleen otherwise appears normal. The can't trash of the kidneys demonstrate prompt and homogeneous enhancement. No hydronephrosis or focal lesion. The urinary bladder is unremarkable. The prostate gland is mildly prominent. The stomach and small and large bowel appear normal. There is no lymphadenopathy. Bones demonstrate postoperative change of vertebral augmentation from L1-2 L5. No lytic or sclerotic bony lesion is identified. No acute fracture. IMPRESSION: Status post repair of an abdominal aortic aneurysm. 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 A very small amount of simple, scattered fluid in the abdomen is likely related to the patient's recent surgery. No organized fluid collection or hemorrhage is identified. Cardiomegaly. Fluid in the distal esophagus suggestive of reflux and/or poor motility. Very small bilateral pleural effusions. Electronically Signed   By: Drusilla Kanner M.D.   On: 01/21/2016 13:10   Dg Chest Port 1 View  01/04/2016  CLINICAL DATA:  Aneurysm repair. EXAM: PORTABLE CHEST 1 VIEW COMPARISON:  01/03/2016. FINDINGS: Swan-Ganz catheter and NG tube in stable position. Cardiac pacer stable position. Prior CABG. Stable cardiomegaly. Slight increase in diffuse bilateral from interstitial prominence suggesting congestive heart failure. No prominent pleural effusion or pneumothorax. IMPRESSION: 1. Lines and tubes in stable position. 2. Prior CABG. Persistent cardiomegaly with slight increase in pulmonary  interstitial prominence bilaterally suggesting increasing mild congestive heart failure. Electronically Signed   By: Maisie Fus  Register   On: 01/04/2016 07:41   Dg Chest Port 1 View  01/03/2016  CLINICAL DATA:  Immediate postop abdominal aortic aneurysm repair with bifurcated graft. EXAM: PORTABLE CHEST 1 VIEW COMPARISON:  09/09/2015 and earlier. FINDINGS: Right jugular Swan-Ganz catheter tip projects over the main pulmonary trunk. Nasogastric tube courses below the diaphragm into the proximal stomach. Left subclavian biventricular pacemaker unchanged and appears intact. Prior CABG. Cardiac silhouette moderately enlarged, unchanged, allowing for differences in technique. Pulmonary venous hypertension and mild interstitial pulmonary edema. Small right pleural effusion. No confluent  airspace consolidation. No pneumothorax. No mediastinal hematoma. IMPRESSION: 1. Support apparatus satisfactory. 2. Mild CHF and/or fluid overload with stable moderate cardiomegaly, mild interstitial pulmonary edema and small right pleural effusion. Electronically Signed   By: Hulan Saas M.D.   On: 01/03/2016 13:53   Dg Abd Portable 1v  01/03/2016  CLINICAL DATA:  Abdominal aortic aneurysm repair. EXAM: PORTABLE ABDOMEN - 1 VIEW COMPARISON:  None. FINDINGS: Portable supine view the abdomen at 1342 hours shows a nonspecific bowel gas pattern. Curvilinear lucency is seen along the liver, potentially along falciform ligament. Bones are diffusely demineralized. Patient is status post multilevel lumbar vertebral augmentation. NG tube tip overlies the medial stomach. IMPRESSION: Curvilinear lucency along the liver may be intraperitoneal free air outlining the liver surface. The there is clinical concern for intraperitoneal free air, lateral decubitus x-ray may prove helpful to further evaluate. Nonspecific bowel gas pattern. These results will be called to the ordering clinician or representative by the Radiologist Assistant, and  communication documented in the PACS or zVision Dashboard. Electronically Signed   By: Kennith Center M.D.   On: 01/03/2016 13:52     Physical Exam: BP 148/66 mmHg  Pulse 64  Ht 6\' 1"  (1.854 m)  Wt 204 lb 6.4 oz (92.715 kg)  BMI 26.97 kg/m2 Awake Alert, Oriented x 3, No new F.N deficits, Normal affect Jennings.AT,PERRAL Supple Neck,No JVD, No cervical lymphadenopathy appriciated.  Symmetrical Chest wall movement, Good air movement bilaterally, CTAB RRR,No Gallops,Rubs or new Murmurs, No Parasternal Heave +ve B.Sounds, Abd Soft, Non tender, No organomegaly appriciated, No rebound -guarding or rigidity. No Cyanosis, Clubbing or edema, No new Rash or bruise Assessment and Recommendations:  75 year old male with history of chronic A. fib, cardiomyopathy, diastolic dysfunction, with biventricular AICD, on per Dr., who presented to the hospital with hematemesis. Hemoglobin remained stable, and patient had no further hematemesis in the hospital. He was discharged the following day. It was felt that he likely had a Mallory-Weiss tear. He's been instructed to continue pantoprazole 40 mg twice a day for one month, and then decrease to once daily. A CBC will be checked today. He's been advised to contact us immediately if he has any melena or bright red blood per rectum. He will follow-up in 2 months, sooner if needed.       Thong Feeny, Moise Boring 01/25/2016,  Cc;DR Shary Decamp

## 2016-01-27 ENCOUNTER — Ambulatory Visit: Payer: Medicare Other | Admitting: Physician Assistant

## 2016-02-09 ENCOUNTER — Ambulatory Visit (INDEPENDENT_AMBULATORY_CARE_PROVIDER_SITE_OTHER): Payer: Medicare Other | Admitting: Nurse Practitioner

## 2016-02-09 ENCOUNTER — Encounter: Payer: Self-pay | Admitting: *Deleted

## 2016-02-09 ENCOUNTER — Encounter: Payer: Self-pay | Admitting: Nurse Practitioner

## 2016-02-09 VITALS — BP 140/85 | HR 80 | Ht 73.0 in | Wt 205.0 lb

## 2016-02-09 DIAGNOSIS — I482 Chronic atrial fibrillation: Secondary | ICD-10-CM

## 2016-02-09 DIAGNOSIS — I429 Cardiomyopathy, unspecified: Secondary | ICD-10-CM

## 2016-02-09 DIAGNOSIS — I5022 Chronic systolic (congestive) heart failure: Secondary | ICD-10-CM | POA: Diagnosis not present

## 2016-02-09 DIAGNOSIS — I428 Other cardiomyopathies: Secondary | ICD-10-CM

## 2016-02-09 DIAGNOSIS — I255 Ischemic cardiomyopathy: Secondary | ICD-10-CM

## 2016-02-09 DIAGNOSIS — I4821 Permanent atrial fibrillation: Secondary | ICD-10-CM

## 2016-02-09 DIAGNOSIS — I10 Essential (primary) hypertension: Secondary | ICD-10-CM

## 2016-02-09 NOTE — Progress Notes (Signed)
  Electrophysiology Office Note Date: 02/09/2016  ID:  [name] [name], DOB 06/15/1941, MRN [id number]  PCP: GRISSO,GREG, MD Primary Cardiologist: Crenshaw Electrophysiologist: [name]  CC: discuss Watchman  [name] [name] is a 74 y.o. male seen today for Dr [name].  He presents today to discuss Watchman implant.  He was seen in the hospital recently with GI bleeding.  At that time, he was evaluated by Dr [name] and felt to be an appropriate Watchman candidate.  Since discharge, the patient reports doing reasonably well. He denies chest pain, palpitations, dyspnea, PND, orthopnea, nausea, vomiting, dizziness, syncope, edema, weight gain, or early satiety.  He has not had ICD shocks. He has had no further GI bleeding that he is aware of.  Device History: STJ CRTD implanted 2013 for ICM, chronic systolic heart failure History of appropriate therapy: No History of AAD therapy: Yes - amiodarone for AF    Past Medical History  Diagnosis Date  . Atrial fibrillation (HCC)     s/p prior DCCV;  Amiodarone Rx.  . CAD (coronary artery disease)     s/p CABG;  cath 10/07:  LM ok, LAD occluded, AV CFX 95%, pRCA occluded; L-LAD, S-OM2/OM3, S-PDA ok  . Cerebrovascular disease     s/p prior Left CEA;  followed by Dr. Early  . COPD (chronic obstructive pulmonary disease) (HCC)   . Hyperlipidemia   . Hypertension   . Mitral insufficiency     hx of. not noted on echo 01/2012  . Nephrolithiasis   . Ischemic cardiomyopathy     echo 5/09: EF 55%, mild LAE;  Myoview 11/10: inf and apical scar with mild peri-infarct ischemia, EF 44%. Echo 01/26/12 with EF 20-25% but previously known to be 30% on echoes before last one  . Iliac artery aneurysm, left (HCC)     followed by Dr. Early  . Aortic dissection (HCC)     H/O focal aortic dissection  . LBBB (left bundle branch block)   . CHF (congestive heart failure) (HCC)   . Myocardial infarction (HCC)   . Renal insufficiency     01/2012  . Stroke (HCC)    . CVA (cerebral infarction)   . DVT (deep venous thrombosis) (HCC)   . Fall from slipping on wet surface Apr 15, 2015    Fx 3 Vertabrea  . AICD (automatic cardioverter/defibrillator) present    Past Surgical History  Procedure Laterality Date  . Uteroscopy    . Coronary artery bypass graft  1998  . Appendectomy  09/11/11  . Biventricular defibrillator implantation  08/22/12    SJM Quadra Assura BiV ICD implanted by Dr [name]  . Spine surgery  Sep 27, 2014    Lower Back L 4  and   L 5  . Bi-ventricular implantable cardioverter defibrillator N/A 08/22/2012    Procedure: BI-VENTRICULAR IMPLANTABLE CARDIOVERTER DEFIBRILLATOR  (CRT-D);  Surgeon: James [name], MD;  Location: [facility] CATH LAB;  Service: Cardiovascular;  Laterality: N/A;  . Carotid endarterectomy  July  2005    left  . Cardiac catheterization  2013  . Abdominal aortic aneurysm repair N/A 01/03/2016    Procedure: ANEURYSM ABDOMINAL AORTA, BILATERAL COMMON ILIAC REPAIR;  Surgeon: Todd F Early, MD;  Location: [facility] OR;  Service: Vascular;  Laterality: N/A;    Current Outpatient Prescriptions  Medication Sig Dispense Refill  . albuterol (PROVENTIL HFA;VENTOLIN HFA) 108 (90 BASE) MCG/ACT inhaler Inhale 2 puffs into the lungs every 4 (four) hours as needed for wheezing. 1 Inhaler 0  .   amiodarone (PACERONE) 200 MG tablet Take 200 mg by mouth daily.    . amLODipine (NORVASC) 10 MG tablet Take 10 mg by mouth daily.    . citalopram (CELEXA) 10 MG tablet Take 10 mg by mouth daily.    . dabigatran (PRADAXA) 150 MG CAPS capsule Take 1 capsule (150 mg total) by mouth 2 (two) times daily. 180 capsule 3  . furosemide (LASIX) 20 MG tablet Take 2 tablets (40 mg total) by mouth as needed for edema. 60 tablet 12  . furosemide (LASIX) 40 MG tablet Take 40 mg by mouth daily.     . hydrALAZINE (APRESOLINE) 50 MG tablet Take 0.5 tablets (25 mg total) by mouth 3 (three) times daily. 135 tablet 3  . ipratropium-albuterol (DUONEB) 0.5-2.5 (3) MG/3ML SOLN Take  3 mLs by nebulization every 4 (four) hours as needed (for shortness of breath). 360 mL 5  . lisinopril (PRINIVIL,ZESTRIL) 40 MG tablet TAKE 1 TABLET EVERY DAY 90 tablet 3  . metoprolol tartrate (LOPRESSOR) 25 MG tablet Take 12.5 mg by mouth 2 (two) times daily.    . Multiple Vitamin (MULITIVITAMIN WITH MINERALS) TABS Take 1 tablet by mouth daily.    . niacin (NIASPAN) 500 MG CR tablet Take 1,000 mg by mouth 2 (two) times daily.     . nitroGLYCERIN (NITROSTAT) 0.4 MG SL tablet Place 0.4 mg under the tongue every 5 (five) minutes as needed for chest pain.     . pantoprazole (PROTONIX) 40 MG tablet Take 1 tablet (40 mg total) by mouth 2 (two) times daily. 60 tablet 0  . potassium chloride SA (K-DUR,KLOR-CON) 20 MEQ tablet TAKE 2 TABLETS THREE TIMES DAILY 540 tablet 0  . pravastatin (PRAVACHOL) 80 MG tablet Take 1 tablet (80 mg total) by mouth daily. 90 tablet 2  . tiotropium (SPIRIVA HANDIHALER) 18 MCG inhalation capsule Place 1 capsule (18 mcg total) into inhaler and inhale daily. 30 capsule 2   No current facility-administered medications for this visit.    Allergies:   Buprenorphine hcl and Morphine and related   Social History: Social History   Social History  . Marital Status: Married    Spouse Name: N/A  . Number of Children: 3  . Years of Education: 10 TH   Occupational History  . RETIRED---truck driver    Social History Main Topics  . Smoking status: Former Smoker -- 1.50 packs/day for 57 years    Types: Cigarettes    Quit date: 12/11/2004  . Smokeless tobacco: Never Used  . Alcohol Use: No     Comment: 6 yrs ago   . Drug Use: No  . Sexual Activity: No   Other Topics Concern  . Not on file   Social History Narrative   Patient is married with 3 children.   Patient is right handed.   Patient has 10 th grade education.   Patient drinks tea occasionally.    Family History: Family History  Problem Relation Age of Onset  . Stroke Maternal Uncle   . Heart attack Neg  Hx   . Hypertension Neg Hx   . Stroke Maternal Uncle     Review of Systems: All other systems reviewed and are otherwise negative except as noted above.   Physical Exam: VS:  BP 140/85 mmHg  Pulse 80  Ht 6' 1" (1.854 m)  Wt 205 lb (92.987 kg)  BMI 27.05 kg/m2 , BMI Body mass index is 27.05 kg/(m^2).  GEN- The patient is elderly appearing, alert and oriented   x 3 today.   HEENT: normocephalic, atraumatic; sclera clear, conjunctiva pink; hearing intact; oropharynx clear; neck supple  Lungs- Clear to ausculation bilaterally, normal work of breathing.  No wheezes, rales, rhonchi Heart- Irregular rate and rhythm, no murmurs, rubs or gallops  GI- soft, non-tender, non-distended, bowel sounds present  Extremities- no clubbing, cyanosis, or edema; DP/PT/radial pulses 2+ bilaterally MS- no significant deformity or atrophy Skin- warm and dry, no rash or lesion; ICD pocket well healed Psych- euthymic mood, full affect Neuro- strength and sensation are intact  ICD interrogation- reviewed in detail today,  See PACEART report  EKG:  EKG is not ordered today.  Recent Labs: 03/04/2015: B Natriuretic Peptide 122.9* 01/04/2016: Magnesium 2.2 01/21/2016: ALT 14* 01/22/2016: BUN 13; Creatinine, Ser 1.19; Potassium 3.4*; Sodium 142 01/25/2016: Hemoglobin 13.7; Platelets 206.0   Wt Readings from Last 3 Encounters:  02/09/16 205 lb (92.987 kg)  01/25/16 206 lb (93.441 kg)  01/25/16 204 lb 6.4 oz (92.715 kg)     Other studies Reviewed: Additional studies/ records that were reviewed today include: Dr [name]'s office notes, hospital records  Assessment and Plan:  1.  Permanent atrial fibrillation Asymptomatic but ventricular rates are limiting CRT pacing - no changes today, but will need to discuss with Dr [name] need to continue Amiodarone or possibly AVN ablation to promote CRT pacing.  He has had a recent GIB on Pradaxa. Per GI evaluation, ok to resume Pradaxa with close follow-up.  With GI  bleed, he is not felt to be a candidate for long term anticoagulation.  He has discussed Watchman with Dr [name] while in the hospital recently.  Will schedule TEE at this time to evaluate LAA anatomy.  Pt understands that our ability to implant Watchman is dependent on anatomy.  Will schedule TEE with Dr Crenshaw (primary cardiologist) who knows him well to get his thoughts on appropriateness of Watchman implant. This patients CHA2DS2-VASc Score and unadjusted Ischemic Stroke Rate (% per year) is equal to 11.2 % stroke rate/year from a score of 7 which necessitates long term oral anticoagulation to prevent stroke. HasBled score is 4.  Modified Rankin Score is 0.  Procedural risks for the Watchman implant have been reviewed with the patient including a 1% risk of stroke, 2% risk of perforation, 0.1% risk of device embolization.   Risks, benefits to TEE were also reviewed with the patient today who wishes to proceed.   Of note, DVT is listed in his PMH.  I am unable to find documentation of this and the patient is unaware of ever having a DVT in the past.   2. Chronic systolic dysfunction euvolemic today Stable on an appropriate medical regimen Normal ICD function with decreased CRT pacing as above. Will follow for now See Pace Art report No changes today  3.  HTN Stable No change required today  Current medicines are reviewed at length with the patient today.   The patient does not have concerns regarding his medicines.  The following changes were made today:  none  Labs/ tests ordered today include:  Orders Placed This Encounter  Procedures  . CBC  . Basic Metabolic Panel (BMET)    Disposition:   Follow up with TEE on 02/28/16 with Dr Crenshaw, I will follow up by phone following TEE   Signed, Ahmira Boisselle, NP 02/09/2016 12:47 PM  [facility] [address] [place] [address] [phone] (office) [phone] (fax)  

## 2016-02-09 NOTE — Patient Instructions (Signed)
Medication Instructions:   Your physician recommends that you continue on your current medications as directed. Please refer to the Current Medication list given to you today.   If you need a refill on your cardiac medications before your next appointment, please call your pharmacy.  Labwork:  RETURN FOR CBC AND BMET  02/25/16 .[name]   Testing/Procedures:  SEE LETTER FOR TEE   Follow-Up:  WILL BE DETERMINED AFTER TEE    WILL DISCUSS APPT WITH WATCH MAN AROUND 04/06/16 BUT AFTER RESULTS  Remote monitoring is used to monitor your Pacemaker of ICD from home. This monitoring reduces the number of office visits required to check your device to one time per year. It allows [place] to keep an eye on the functioning of your device to ensure it is working properly. You are scheduled for a device check from home on .05/11/16..You may send your transmission at any time that day. If you have a wireless device, the transmission will be sent automatically. After your physician reviews your transmission, you will receive a postcard with your next transmission date.     Any Other Special Instructions Will Be Listed Below (If Applicable).

## 2016-02-23 ENCOUNTER — Encounter: Payer: Medicare Other | Admitting: Nurse Practitioner

## 2016-02-24 ENCOUNTER — Ambulatory Visit: Payer: Medicare Other | Admitting: Cardiology

## 2016-02-25 ENCOUNTER — Other Ambulatory Visit (INDEPENDENT_AMBULATORY_CARE_PROVIDER_SITE_OTHER): Payer: Medicare Other | Admitting: *Deleted

## 2016-02-25 ENCOUNTER — Telehealth: Payer: Self-pay | Admitting: *Deleted

## 2016-02-25 ENCOUNTER — Telehealth: Payer: Self-pay | Admitting: Cardiology

## 2016-02-25 DIAGNOSIS — I4821 Permanent atrial fibrillation: Secondary | ICD-10-CM

## 2016-02-25 DIAGNOSIS — I482 Chronic atrial fibrillation: Secondary | ICD-10-CM | POA: Diagnosis not present

## 2016-02-25 LAB — CBC
HEMATOCRIT: 41.6 % (ref 39.0–52.0)
HEMOGLOBIN: 12.9 g/dL — AB (ref 13.0–17.0)
MCH: 27.3 pg (ref 26.0–34.0)
MCHC: 31 g/dL (ref 30.0–36.0)
MCV: 88.1 fL (ref 78.0–100.0)
MPV: 10.2 fL (ref 8.6–12.4)
Platelets: 257 10*3/uL (ref 150–400)
RBC: 4.72 MIL/uL (ref 4.22–5.81)
RDW: 16.6 % — AB (ref 11.5–15.5)
WBC: 7.4 10*3/uL (ref 4.0–10.5)

## 2016-02-25 LAB — BASIC METABOLIC PANEL
BUN: 34 mg/dL — AB (ref 7–25)
CHLORIDE: 105 mmol/L (ref 98–110)
CO2: 29 mmol/L (ref 20–31)
CREATININE: 1.62 mg/dL — AB (ref 0.70–1.18)
Calcium: 8.7 mg/dL (ref 8.6–10.3)
GLUCOSE: 102 mg/dL — AB (ref 65–99)
POTASSIUM: 3.5 mmol/L (ref 3.5–5.3)
Sodium: 147 mmol/L — ABNORMAL HIGH (ref 135–146)

## 2016-02-25 NOTE — Telephone Encounter (Signed)
-----   Message from Marily Lente, NP sent at 02/25/2016  4:22 PM EDT ----- Please notify patient of lab results. Ask patient to hold Lasix and Lisinopril this weekend and we will recheck labs on Monday.

## 2016-02-25 NOTE — Telephone Encounter (Signed)
Returned call. Explained TEE scheduled is for evaluation of anatomy to see if the watchman implant can be done. Pt voiced understanding. He verbalized understanding of instructions regarding short stay check-in, etc on Monday.

## 2016-02-25 NOTE — Telephone Encounter (Signed)
New message      Pt is having a procedure by Dr [name] next week.  He want to know what is the procedure for?

## 2016-02-28 ENCOUNTER — Encounter (HOSPITAL_COMMUNITY)
Admission: RE | Disposition: A | Discharge status: Home / Self Care | Payer: Self-pay | Source: Ambulatory Visit | Attending: Cardiology

## 2016-02-28 ENCOUNTER — Ambulatory Visit (HOSPITAL_COMMUNITY): Admission: RE | Admit: 2016-02-28 | Payer: Medicare Other | Source: Ambulatory Visit | Admitting: Cardiology

## 2016-02-28 ENCOUNTER — Encounter (HOSPITAL_COMMUNITY): Payer: Self-pay

## 2016-02-28 ENCOUNTER — Ambulatory Visit (HOSPITAL_COMMUNITY)
Admission: RE | Admit: 2016-02-28 | Discharge: 2016-02-28 | Disposition: A | Discharge status: Home / Self Care | Payer: Medicare Other | Source: Ambulatory Visit | Attending: Cardiology | Admitting: Cardiology

## 2016-02-28 DIAGNOSIS — Z951 Presence of aortocoronary bypass graft: Secondary | ICD-10-CM | POA: Insufficient documentation

## 2016-02-28 DIAGNOSIS — Z8673 Personal history of transient ischemic attack (TIA), and cerebral infarction without residual deficits: Secondary | ICD-10-CM | POA: Diagnosis not present

## 2016-02-28 DIAGNOSIS — I2510 Atherosclerotic heart disease of native coronary artery without angina pectoris: Secondary | ICD-10-CM | POA: Diagnosis not present

## 2016-02-28 DIAGNOSIS — I110 Hypertensive heart disease with heart failure: Secondary | ICD-10-CM | POA: Insufficient documentation

## 2016-02-28 DIAGNOSIS — I252 Old myocardial infarction: Secondary | ICD-10-CM | POA: Insufficient documentation

## 2016-02-28 DIAGNOSIS — R1310 Dysphagia, unspecified: Secondary | ICD-10-CM

## 2016-02-28 DIAGNOSIS — Z95810 Presence of automatic (implantable) cardiac defibrillator: Secondary | ICD-10-CM | POA: Diagnosis not present

## 2016-02-28 DIAGNOSIS — I255 Ischemic cardiomyopathy: Secondary | ICD-10-CM | POA: Diagnosis not present

## 2016-02-28 DIAGNOSIS — K222 Esophageal obstruction: Secondary | ICD-10-CM | POA: Diagnosis not present

## 2016-02-28 DIAGNOSIS — J449 Chronic obstructive pulmonary disease, unspecified: Secondary | ICD-10-CM | POA: Diagnosis not present

## 2016-02-28 DIAGNOSIS — I5022 Chronic systolic (congestive) heart failure: Secondary | ICD-10-CM | POA: Insufficient documentation

## 2016-02-28 DIAGNOSIS — Z87891 Personal history of nicotine dependence: Secondary | ICD-10-CM | POA: Insufficient documentation

## 2016-02-28 DIAGNOSIS — B3781 Candidal esophagitis: Secondary | ICD-10-CM | POA: Insufficient documentation

## 2016-02-28 DIAGNOSIS — E785 Hyperlipidemia, unspecified: Secondary | ICD-10-CM | POA: Insufficient documentation

## 2016-02-28 DIAGNOSIS — Z7901 Long term (current) use of anticoagulants: Secondary | ICD-10-CM | POA: Insufficient documentation

## 2016-02-28 DIAGNOSIS — Z79899 Other long term (current) drug therapy: Secondary | ICD-10-CM | POA: Diagnosis not present

## 2016-02-28 DIAGNOSIS — I482 Chronic atrial fibrillation: Secondary | ICD-10-CM | POA: Diagnosis not present

## 2016-02-28 HISTORY — PX: ESOPHAGOGASTRODUODENOSCOPY: SHX5428

## 2016-02-28 LAB — BASIC METABOLIC PANEL
ANION GAP: 15 (ref 5–15)
BUN: 36 mg/dL — ABNORMAL HIGH (ref 6–20)
CHLORIDE: 106 mmol/L (ref 101–111)
CO2: 26 mmol/L (ref 22–32)
Calcium: 8.8 mg/dL — ABNORMAL LOW (ref 8.9–10.3)
Creatinine, Ser: 1.79 mg/dL — ABNORMAL HIGH (ref 0.61–1.24)
GFR calc non Af Amer: 36 mL/min — ABNORMAL LOW (ref >60)
GFR, EST AFRICAN AMERICAN: 42 mL/min — AB (ref >60)
GLUCOSE: 112 mg/dL — AB (ref 65–99)
Potassium: 3.2 mmol/L — ABNORMAL LOW (ref 3.5–5.1)
Sodium: 147 mmol/L — ABNORMAL HIGH (ref 135–145)

## 2016-02-28 LAB — POCT I-STAT 4, (NA,K, GLUC, HGB,HCT)
Glucose, Bld: 105 mg/dL — ABNORMAL HIGH (ref 65–99)
HEMATOCRIT: 40 % (ref 39.0–52.0)
Hemoglobin: 13.6 g/dL (ref 13.0–17.0)
Potassium: 2.9 mmol/L — ABNORMAL LOW (ref 3.5–5.1)
Sodium: 144 mmol/L (ref 135–145)

## 2016-02-28 SURGERY — EGD (ESOPHAGOGASTRODUODENOSCOPY)
Anesthesia: Moderate Sedation

## 2016-02-28 MED ORDER — POTASSIUM CHLORIDE CRYS ER 20 MEQ PO TBCR
40.0000 meq | EXTENDED_RELEASE_TABLET | Freq: Once | ORAL | Status: AC
  Administered 2016-02-28: 40 meq via ORAL
  Filled 2016-02-28: qty 2

## 2016-02-28 MED ORDER — FENTANYL CITRATE (PF) 100 MCG/2ML IJ SOLN
INTRAMUSCULAR | Status: AC
  Filled 2016-02-28: qty 4

## 2016-02-28 MED ORDER — MIDAZOLAM HCL 10 MG/2ML IJ SOLN
INTRAMUSCULAR | Status: DC | PRN
  Administered 2016-02-28 (×2): 2 mg via INTRAVENOUS

## 2016-02-28 MED ORDER — MIDAZOLAM HCL 5 MG/ML IJ SOLN
INTRAMUSCULAR | Status: AC
  Filled 2016-02-28: qty 2

## 2016-02-28 MED ORDER — DIPHENHYDRAMINE HCL 50 MG/ML IJ SOLN
INTRAMUSCULAR | Status: AC
  Filled 2016-02-28: qty 1

## 2016-02-28 MED ORDER — FENTANYL CITRATE (PF) 100 MCG/2ML IJ SOLN
INTRAMUSCULAR | Status: DC | PRN
  Administered 2016-02-28 (×2): 25 ug via INTRAVENOUS

## 2016-02-28 MED ORDER — SODIUM CHLORIDE 0.9 % IV SOLN
INTRAVENOUS | Status: DC
Start: 2016-02-28 — End: 2016-02-28

## 2016-02-28 MED ORDER — BUTAMBEN-TETRACAINE-BENZOCAINE 2-2-14 % EX AERO
INHALATION_SPRAY | CUTANEOUS | Status: DC | PRN
Start: 2016-02-28 — End: 2016-02-28
  Administered 2016-02-28: 2 via TOPICAL

## 2016-02-28 NOTE — H&P (View-Only) (Signed)
Electrophysiology Office Note Date: 02/09/2016  ID:  [name] [name], DOB February 01, 1941, MRN [id number]  PCP: [name], MD Primary Cardiologist: [name] Electrophysiologist: [name]  CC: discuss Watchman  [name] [name] is a 75 y.o. male seen today for Dr [name].  He presents today to discuss Watchman implant.  He was seen in the hospital recently with GI bleeding.  At that time, he was evaluated by Dr [name] and felt to be an appropriate Watchman candidate.  Since discharge, the patient reports doing reasonably well. He denies chest pain, palpitations, dyspnea, PND, orthopnea, nausea, vomiting, dizziness, syncope, edema, weight gain, or early satiety.  He has not had ICD shocks. He has had no further GI bleeding that he is aware of.  Device History: STJ CRTD implanted 2013 for ICM, chronic systolic heart failure History of appropriate therapy: No History of AAD therapy: Yes - amiodarone for AF    Past Medical History  Diagnosis Date  . Atrial fibrillation (HCC)     s/p prior DCCV;  Amiodarone Rx.  . CAD (coronary artery disease)     s/p CABG;  cath 10/07:  LM ok, LAD occluded, AV CFX 95%, pRCA occluded; L-LAD, S-OM2/OM3, S-PDA ok  . Cerebrovascular disease     s/p prior Left CEA;  followed by Dr. [name]  . COPD (chronic obstructive pulmonary disease) (HCC)   . Hyperlipidemia   . Hypertension   . Mitral insufficiency     hx of. not noted on echo 01/2012  . Nephrolithiasis   . Ischemic cardiomyopathy     echo 5/09: EF 55%, mild LAE;  Myoview 11/10: inf and apical scar with mild peri-infarct ischemia, EF 44%. Echo 01/26/12 with EF 20-25% but previously known to be 30% on echoes before last one  . Iliac artery aneurysm, left (HCC)     followed by Dr. [name]  . Aortic dissection (HCC)     H/O focal aortic dissection  . LBBB (left bundle branch block)   . CHF (congestive heart failure) (HCC)   . Myocardial infarction (HCC)   . Renal insufficiency     01/2012  . Stroke (HCC)    . CVA (cerebral infarction)   . DVT (deep venous thrombosis) (HCC)   . Fall from slipping on wet surface Apr 15, 2015    Fx 3 Vertabrea  . AICD (automatic cardioverter/defibrillator) present    Past Surgical History  Procedure Laterality Date  . Uteroscopy    . Coronary artery bypass graft  1998  . Appendectomy  09/11/11  . Biventricular defibrillator implantation  08/22/12    SJM Quadra Assura BiV ICD implanted by Dr [name]  . Spine surgery  Sep 27, 2014    Lower Back L 4  and   L 5  . Bi-ventricular implantable cardioverter defibrillator N/A 08/22/2012    Procedure: BI-VENTRICULAR IMPLANTABLE CARDIOVERTER DEFIBRILLATOR  (CRT-D);  Surgeon: [name], MD;  Location: [facility] CATH LAB;  Service: Cardiovascular;  Laterality: N/A;  . Carotid endarterectomy  July  2005    left  . Cardiac catheterization  2013  . Abdominal aortic aneurysm repair N/A 01/03/2016    Procedure: ANEURYSM ABDOMINAL AORTA, BILATERAL COMMON ILIAC REPAIR;  Surgeon: [name], MD;  Location: [facility] OR;  Service: Vascular;  Laterality: N/A;    Current Outpatient Prescriptions  Medication Sig Dispense Refill  . albuterol (PROVENTIL HFA;VENTOLIN HFA) 108 (90 BASE) MCG/ACT inhaler Inhale 2 puffs into the lungs every 4 (four) hours as needed for wheezing. 1 Inhaler 0  .  amiodarone (PACERONE) 200 MG tablet Take 200 mg by mouth daily.    [name] amLODipine (NORVASC) 10 MG tablet Take 10 mg by mouth daily.    . citalopram (CELEXA) 10 MG tablet Take 10 mg by mouth daily.    . dabigatran (PRADAXA) 150 MG CAPS capsule Take 1 capsule (150 mg total) by mouth 2 (two) times daily. 180 capsule 3  . furosemide (LASIX) 20 MG tablet Take 2 tablets (40 mg total) by mouth as needed for edema. 60 tablet 12  . furosemide (LASIX) 40 MG tablet Take 40 mg by mouth daily.     . hydrALAZINE (APRESOLINE) 50 MG tablet Take 0.5 tablets (25 mg total) by mouth 3 (three) times daily. 135 tablet 3  . ipratropium-albuterol (DUONEB) 0.5-2.5 (3) MG/3ML SOLN Take  3 mLs by nebulization every 4 (four) hours as needed (for shortness of breath). 360 mL 5  . lisinopril (PRINIVIL,ZESTRIL) 40 MG tablet TAKE 1 TABLET EVERY DAY 90 tablet 3  . metoprolol tartrate (LOPRESSOR) 25 MG tablet Take 12.5 mg by mouth 2 (two) times daily.    . Multiple Vitamin (MULITIVITAMIN WITH MINERALS) TABS Take 1 tablet by mouth daily.    . niacin (NIASPAN) 500 MG CR tablet Take 1,000 mg by mouth 2 (two) times daily.     . nitroGLYCERIN (NITROSTAT) 0.4 MG SL tablet Place 0.4 mg under the tongue every 5 (five) minutes as needed for chest pain.     . pantoprazole (PROTONIX) 40 MG tablet Take 1 tablet (40 mg total) by mouth 2 (two) times daily. 60 tablet 0  . potassium chloride SA (K-DUR,KLOR-CON) 20 MEQ tablet TAKE 2 TABLETS THREE TIMES DAILY 540 tablet 0  . pravastatin (PRAVACHOL) 80 MG tablet Take 1 tablet (80 mg total) by mouth daily. 90 tablet 2  . tiotropium (SPIRIVA HANDIHALER) 18 MCG inhalation capsule Place 1 capsule (18 mcg total) into inhaler and inhale daily. 30 capsule 2   No current facility-administered medications for this visit.    Allergies:   Buprenorphine hcl and Morphine and related   Social History: Social History   Social History  . Marital Status: Married    Spouse Name: N/A  . Number of Children: 3  . Years of Education: 10 TH   Occupational History  . RETIRED---truck driver    Social History Main Topics  . Smoking status: Former Smoker -- 1.50 packs/day for 57 years    Types: Cigarettes    Quit date: 12/11/2004  . Smokeless tobacco: Never Used  . Alcohol Use: No     Comment: 6 yrs ago   . Drug Use: No  . Sexual Activity: No   Other Topics Concern  . Not on file   Social History Narrative   Patient is married with 3 children.   Patient is right handed.   Patient has 10 th grade education.   Patient drinks tea occasionally.    Family History: Family History  Problem Relation Age of Onset  . Stroke Maternal Uncle   . Heart attack Neg  Hx   . Hypertension Neg Hx   . Stroke Maternal Uncle     Review of Systems: All other systems reviewed and are otherwise negative except as noted above.   Physical Exam: VS:  BP 140/85 mmHg  Pulse 80  Ht 6\' 1"  (1.854 m)  Wt 205 lb (92.987 kg)  BMI 27.05 kg/m2 , BMI Body mass index is 27.05 kg/(m^2).  GEN- The patient is elderly appearing, alert and oriented  x 3 today.   HEENT: normocephalic, atraumatic; sclera clear, conjunctiva pink; hearing intact; oropharynx clear; neck supple  Lungs- Clear to ausculation bilaterally, normal work of breathing.  No wheezes, rales, rhonchi Heart- Irregular rate and rhythm, no murmurs, rubs or gallops  GI- soft, non-tender, non-distended, bowel sounds present  Extremities- no clubbing, cyanosis, or edema; DP/PT/radial pulses 2+ bilaterally MS- no significant deformity or atrophy Skin- warm and dry, no rash or lesion; ICD pocket well healed Psych- euthymic mood, full affect Neuro- strength and sensation are intact  ICD interrogation- reviewed in detail today,  See PACEART report  EKG:  EKG is not ordered today.  Recent Labs: 03/04/2015: B Natriuretic Peptide 122.9* 01/04/2016: Magnesium 2.2 01/21/2016: ALT 14* 01/22/2016: BUN 13; Creatinine, Ser 1.19; Potassium 3.4*; Sodium 142 01/25/2016: Hemoglobin 13.7; Platelets 206.0   Wt Readings from Last 3 Encounters:  02/09/16 205 lb (92.987 kg)  01/25/16 206 lb (93.441 kg)  01/25/16 204 lb 6.4 oz (92.715 kg)     Other studies Reviewed: Additional studies/ records that were reviewed today include: Dr Jenel Lucks office notes, hospital records  Assessment and Plan:  1.  Permanent atrial fibrillation Asymptomatic but ventricular rates are limiting CRT pacing - no changes today, but will need to discuss with Dr [name] need to continue Amiodarone or possibly AVN ablation to promote CRT pacing.  He has had a recent GIB on Pradaxa. Per GI evaluation, ok to resume Pradaxa with close follow-up.  With GI  bleed, he is not felt to be a candidate for long term anticoagulation.  He has discussed Watchman with Dr [name] while in the hospital recently.  Will schedule TEE at this time to evaluate LAA anatomy.  Pt understands that our ability to implant Watchman is dependent on anatomy.  Will schedule TEE with Dr [name] (primary cardiologist) who knows him well to get his thoughts on appropriateness of Watchman implant. This patients CHA2DS2-VASc Score and unadjusted Ischemic Stroke Rate (% per year) is equal to 11.2 % stroke rate/year from a score of 7 which necessitates long term oral anticoagulation to prevent stroke. HasBled score is 4.  Modified Rankin Score is 0.  Procedural risks for the Watchman implant have been reviewed with the patient including a 1% risk of stroke, 2% risk of perforation, 0.1% risk of device embolization.   Risks, benefits to TEE were also reviewed with the patient today who wishes to proceed.   Of note, DVT is listed in his PMH.  I am unable to find documentation of this and the patient is unaware of ever having a DVT in the past.   2. Chronic systolic dysfunction euvolemic today Stable on an appropriate medical regimen Normal ICD function with decreased CRT pacing as above. Will follow for now See Pace Art report No changes today  3.  HTN Stable No change required today  Current medicines are reviewed at length with the patient today.   The patient does not have concerns regarding his medicines.  The following changes were made today:  none  Labs/ tests ordered today include:  Orders Placed This Encounter  Procedures  . CBC  . Basic Metabolic Panel (BMET)    Disposition:   Follow up with TEE on 02/28/16 with Dr [name], I will follow up by phone following TEE   Signed, Gypsy Balsam, NP 02/09/2016 12:47 PM  [facility] [address] [place] [address] [phone] (office) [phone] (fax)

## 2016-02-28 NOTE — H&P (Addendum)
E Durbin  02/09/2016 10:40 AM  Office Visit  MRN:  [id number]   Description: Male DOB: 06/15/1941  Provider: Marily Lente, NP  Department: [facility] [facility]       Vital Signs  Most recent update: 02/09/2016 10:39 AM by Reesa Chew, CMA    BP Pulse Ht Wt BMI    140/85 mmHg 80  (1.854 m) 205 lb (92.987 kg) 27.05 kg/m2    Vitals History     Progress Notes      Marily Lente, NP at 02/09/2016 7:37 AM     Status: Signed       Expand All Collapse All      Electrophysiology Office Note Date: 02/09/2016  ID: JOVANNI RASH, DOB [birth year], MRN [id number]  PCP: [name], MD Primary Cardiologist: [name] Electrophysiologist: [name]  CC: discuss Watchman  QUINNTIN MALTER is a 75 y.o. male seen today for Dr [name]. He presents today to discuss Watchman implant. He was seen in the hospital recently with GI bleeding. At that time, he was evaluated by Dr [name] and felt to be an appropriate Watchman candidate. Since discharge, the patient reports doing reasonably well. He denies chest pain, palpitations, dyspnea, PND, orthopnea, nausea, vomiting, dizziness, syncope, edema, weight gain, or early satiety. He has not had ICD shocks. He has had no further GI bleeding that he is aware of.  Device History: STJ CRTD implanted 2013 for ICM, chronic systolic heart failure History of appropriate therapy: No History of AAD therapy: Yes - amiodarone for AF    Past Medical History  Diagnosis Date  . Atrial fibrillation (HCC)     s/p prior DCCV; Amiodarone Rx.  . CAD (coronary artery disease)     s/p CABG; cath 10/07: LM ok, LAD occluded, AV CFX 95%, pRCA occluded; L-LAD, S-OM2/OM3, S-PDA ok  . Cerebrovascular disease     s/p prior Left CEA; followed by Dr. [name]  . COPD (chronic obstructive pulmonary disease) (HCC)   . Hyperlipidemia   . Hypertension   . Mitral insufficiency     hx of. not noted on echo 01/2012    . Nephrolithiasis   . Ischemic cardiomyopathy     echo 5/09: EF 55%, mild LAE; Myoview 11/10: inf and apical scar with mild peri-infarct ischemia, EF 44%. Echo 01/26/12 with EF 20-25% but previously known to be 30% on echoes before last one  . Iliac artery aneurysm, left (HCC)     followed by Dr. [name]  . Aortic dissection (HCC)     H/O focal aortic dissection  . LBBB (left bundle branch block)   . CHF (congestive heart failure) (HCC)   . Myocardial infarction (HCC)   . Renal insufficiency     01/2012  . Stroke (HCC)   . CVA (cerebral infarction)   . DVT (deep venous thrombosis) (HCC)   . Fall from slipping on wet surface Apr 15, 2015    Fx 3 Vertabrea  . AICD (automatic cardioverter/defibrillator) present    Past Surgical History  Procedure Laterality Date  . Uteroscopy    . Coronary artery bypass graft  1998  . Appendectomy  09/11/11  . Biventricular defibrillator implantation  08/22/12    SJM Quadra Assura BiV ICD implanted by Dr [name]  . Spine surgery  Sep 27, 2014    Lower Back L 4 and L 5  . Bi-ventricular implantable cardioverter defibrillator N/A 08/22/2012    Procedure: BI-VENTRICULAR IMPLANTABLE CARDIOVERTER DEFIBRILLATOR (CRT-D); Surgeon: [name], MD; Location: [facility]  CATH LAB; Service: Cardiovascular; Laterality: N/A;  . Carotid endarterectomy  July 2005    left  . Cardiac catheterization  2013  . Abdominal aortic aneurysm repair N/A 01/03/2016    Procedure: ANEURYSM ABDOMINAL AORTA, BILATERAL COMMON ILIAC REPAIR; Surgeon: [name], MD; Location: [facility] OR; Service: Vascular; Laterality: N/A;    Current Outpatient Prescriptions  Medication Sig Dispense Refill  . albuterol (PROVENTIL HFA;VENTOLIN HFA) 108 (90 BASE) MCG/ACT inhaler Inhale 2 puffs into the lungs every 4 (four) hours as needed for wheezing. 1 Inhaler 0  . amiodarone  (PACERONE) 200 MG tablet Take 200 mg by mouth daily.    [name] amLODipine (NORVASC) 10 MG tablet Take 10 mg by mouth daily.    . citalopram (CELEXA) 10 MG tablet Take 10 mg by mouth daily.    . dabigatran (PRADAXA) 150 MG CAPS capsule Take 1 capsule (150 mg total) by mouth 2 (two) times daily. 180 capsule 3  . furosemide (LASIX) 20 MG tablet Take 2 tablets (40 mg total) by mouth as needed for edema. 60 tablet 12  . furosemide (LASIX) 40 MG tablet Take 40 mg by mouth daily.     . hydrALAZINE (APRESOLINE) 50 MG tablet Take 0.5 tablets (25 mg total) by mouth 3 (three) times daily. 135 tablet 3  . ipratropium-albuterol (DUONEB) 0.5-2.5 (3) MG/3ML SOLN Take 3 mLs by nebulization every 4 (four) hours as needed (for shortness of breath). 360 mL 5  . lisinopril (PRINIVIL,ZESTRIL) 40 MG tablet TAKE 1 TABLET EVERY DAY 90 tablet 3  . metoprolol tartrate (LOPRESSOR) 25 MG tablet Take 12.5 mg by mouth 2 (two) times daily.    . Multiple Vitamin (MULITIVITAMIN WITH MINERALS) TABS Take 1 tablet by mouth daily.    . niacin (NIASPAN) 500 MG CR tablet Take 1,000 mg by mouth 2 (two) times daily.     . nitroGLYCERIN (NITROSTAT) 0.4 MG SL tablet Place 0.4 mg under the tongue every 5 (five) minutes as needed for chest pain.     . pantoprazole (PROTONIX) 40 MG tablet Take 1 tablet (40 mg total) by mouth 2 (two) times daily. 60 tablet 0  . potassium chloride SA (K-DUR,KLOR-CON) 20 MEQ tablet TAKE 2 TABLETS THREE TIMES DAILY 540 tablet 0  . pravastatin (PRAVACHOL) 80 MG tablet Take 1 tablet (80 mg total) by mouth daily. 90 tablet 2  . tiotropium (SPIRIVA HANDIHALER) 18 MCG inhalation capsule Place 1 capsule (18 mcg total) into inhaler and inhale daily. 30 capsule 2   No current facility-administered medications for this visit.    Allergies: Buprenorphine hcl and Morphine and related   Social History: Social History   Social  History  . Marital Status: Married    Spouse Name: N/A  . Number of Children: 3  . Years of Education: 10 TH   Occupational History  . RETIRED---truck driver    Social History Main Topics  . Smoking status: Former Smoker -- 1.50 packs/day for 57 years    Types: Cigarettes    Quit date: 12/11/2004  . Smokeless tobacco: Never Used  . Alcohol Use: No     Comment: 6 yrs ago   . Drug Use: No  . Sexual Activity: No   Other Topics Concern  . Not on file   Social History Narrative   Patient is married with 3 children.   Patient is right handed.   Patient has 10 th grade education.   Patient drinks tea occasionally.    Family History: Family History  Problem  Relation Age of Onset  . Stroke Maternal Uncle   . Heart attack Neg Hx   . Hypertension Neg Hx   . Stroke Maternal Uncle     Review of Systems: All other systems reviewed and are otherwise negative except as noted above.   Physical Exam: VS: BP 140/85 mmHg  Pulse 80  Ht 6\' 1"  (1.854 m)  Wt 205 lb (92.987 kg)  BMI 27.05 kg/m2 , BMI Body mass index is 27.05 kg/(m^2).  GEN- The patient is elderly appearing, alert and oriented x 3 today.  HEENT: normocephalic, atraumatic; sclera clear, conjunctiva pink; hearing intact; oropharynx clear; neck supple  Lungs- Clear to ausculation bilaterally, normal work of breathing. No wheezes, rales, rhonchi Heart- Irregular rate and rhythm, no murmurs, rubs or gallops  GI- soft, non-tender, non-distended, bowel sounds present  Extremities- no clubbing, cyanosis, or edema; DP/PT/radial pulses 2+ bilaterally MS- no significant deformity or atrophy Skin- warm and dry, no rash or lesion; ICD pocket well healed Psych- euthymic mood, full affect Neuro- strength and sensation are intact  ICD interrogation- reviewed in detail today, See PACEART report  EKG: EKG is not ordered today.  Recent  Labs: 03/04/2015: B Natriuretic Peptide 122.9* 01/04/2016: Magnesium 2.2 01/21/2016: ALT 14* 01/22/2016: BUN 13; Creatinine, Ser 1.19; Potassium 3.4*; Sodium 142 01/25/2016: Hemoglobin 13.7; Platelets 206.0   Wt Readings from Last 3 Encounters:  02/09/16 205 lb (92.987 kg)  01/25/16 206 lb (93.441 kg)  01/25/16 204 lb 6.4 oz (92.715 kg)     Other studies Reviewed: Additional studies/ records that were reviewed today include: Dr Jenel Lucks office notes, hospital records  Assessment and Plan:  1. Permanent atrial fibrillation Asymptomatic but ventricular rates are limiting CRT pacing - no changes today, but will need to discuss with Dr [name] need to continue Amiodarone or possibly AVN ablation to promote CRT pacing.  He has had a recent GIB on Pradaxa. Per GI evaluation, ok to resume Pradaxa with close follow-up. With GI bleed, he is not felt to be a candidate for long term anticoagulation. He has discussed Watchman with Dr [name] while in the hospital recently. Will schedule TEE at this time to evaluate LAA anatomy. Pt understands that our ability to implant Watchman is dependent on anatomy. Will schedule TEE with Dr [name] (primary cardiologist) who knows him well to get his thoughts on appropriateness of Watchman implant. This patients CHA2DS2-VASc Score and unadjusted Ischemic Stroke Rate (% per year) is equal to 11.2 % stroke rate/year from a score of 7 which necessitates long term oral anticoagulation to prevent stroke. HasBled score is 4. Modified Rankin Score is 0. Procedural risks for the Watchman implant have been reviewed with the patient including a 1% risk of stroke, 2% risk of perforation, 0.1% risk of device embolization.  Risks, benefits to TEE were also reviewed with the patient today who wishes to proceed.   Of note, DVT is listed in his PMH. I am unable to find documentation of this and the patient is unaware of ever having a DVT in the past.   2. Chronic  systolic dysfunction euvolemic today Stable on an appropriate medical regimen Normal ICD function with decreased CRT pacing as above. Will follow for now See Pace Art report No changes today  3. HTN Stable No change required today  Current medicines are reviewed at length with the patient today.  The patient does not have concerns regarding his medicines. The following changes were made today: none  Labs/ tests ordered today include:  Orders Placed This Encounter  Procedures  . CBC  . Basic Metabolic Panel (BMET)    Disposition: Follow up with TEE on 02/28/16 with Dr [name], I will follow up by phone following TEE   Signed, Gypsy Balsam, NP 02/09/2016 12:47 PM  [facility] [address] [place] [address] [phone] (office) [phone] (fax)       For TEE; had recent hematemesis felt possibly related to MW tear or esophagitis; EGD planned but not performed as of yet; also complains of dysphagia and weight loss; discussed with Dr Russella Dar; will arrange EGD prior to TEE. Note recent labs showed worsening renal function; pt instructed to hold lisinopril and lasix over weekend; he did take lasix Sat. Will recheck BMET; K by Istat 2.9; will give 40 Meq Kdur po now. Pt also mistakenly held pradaxa over weekend; will resume pending EGD results. Olga Millers

## 2016-02-28 NOTE — Interval H&P Note (Signed)
History and Physical Interval Note:  02/28/2016 11:53 AM  [name] [name]  has presented today for surgery, with the diagnosis of AFIB  The various methods of treatment have been discussed with the patient and family. After consideration of risks, benefits and other options for treatment, the patient has consented to  Procedure(s): TRANSESOPHAGEAL ECHOCARDIOGRAM (TEE) (N/A) ESOPHAGOGASTRODUODENOSCOPY (EGD) (N/A) as a surgical intervention .  The patient's history has been reviewed, patient examined, no change in status, stable for surgery.  I have reviewed the patient's chart and labs.  Questions were answered to the patient's satisfaction.     Venita Lick. Russella Dar

## 2016-02-28 NOTE — Op Note (Signed)
[facility] Patient Name: [name] [name] Procedure Date : 02/28/2016 MRN: [id number] Attending MD: [name] , MD Date of Birth: [birth year] CSN: [id number] Age: 75 Admit Type: Ambulatory Procedure:                Upper GI endoscopy Indications:              Dysphagia Providers:                Venita Lick. Russella Dar, MD Referring MD:              Medicines:                Fentanyl 50 micrograms IV, Midazolam 4 mg IV,                            Cetacaine spray Complications:            No immediate complications. Estimated Blood Loss:     Estimated blood loss: none. Procedure:                Pre-Anesthesia Assessment:                           - Prior to the procedure, a History and Physical                            was performed, and patient medications and                            allergies were reviewed. The patient's tolerance of                            previous anesthesia was also reviewed. The risks                            and benefits of the procedure and the sedation                            options and risks were discussed with the patient.                            All questions were answered, and informed consent                            was obtained. Prior Anticoagulants: The patient has                            taken Pradaxa (dabigatran), last dose was 3 days                            prior to procedure. ASA Grade Assessment: III - A                            patient with severe systemic disease. After  reviewing the risks and benefits, the patient was                            deemed in satisfactory condition to undergo the                            procedure.                           After obtaining informed consent, the endoscope was                            passed under direct vision. Throughout the                            procedure, the patient's blood pressure, pulse, and      oxygen saturations were monitored continuously. The                            EG-[id number] ([id number]) scope was introduced through the                            mouth, and advanced to the second part of duodenum.                            The upper GI endoscopy was accomplished without                            difficulty. The patient tolerated the procedure                            well. No photos due to technical problems with                            Provation. Findings:      One mild benign-appearing, intrinsic stenosis was found 38 cm from the       incisors at the EGJ. This measured 1.4 cm (inner diameter) and was       easily traversed. A guidewire was placed and the scope was withdrawn.       Dilation was performed with a Savary dilator with mild resistance at 14       mm, 15 mm and 16 mm. Estimated blood loss: none. Several white exudates       scattered throughout the esophagus.      The exam of the esophagus was otherwise normal.      The entire examined stomach was normal including retroflexed view.      The duodenal bulb and second portion of the duodenum were normal. Impression:               - Benign-appearing esophageal stenosis. Savary                            dilation performed                           - Candida  esophagitis.                           - Normal stomach.                           - Normal duodenal bulb and second portion of the                            duodenum.                           - No specimens collected. Moderate Sedation:      Moderate (conscious) sedation was personally administered by the       endoscopist. The following parameters were monitored: oxygen saturation,       heart rate, blood pressure, respiratory rate, EKG, adequacy of pulmonary       ventilation, and response to care. Total physician intraservice time was       12 minutes. Recommendation:           - Patient has a contact number available for                             emergencies. The signs and symptoms of potential                            delayed complications were discussed with the                            patient. Return to normal activities tomorrow.                            Written discharge instructions were provided to the                            patient.                           - Clear liquids for 4 hours then soft diet today.                            Resume previous diet tomorrow.                           - Continue present medications including Protonix                            40 mg po bid.                           - Begin Diflucan 100 mg po qd for 7 days                           - Resume Pradaxa (dabigatran) at prior dose in 2  days.                           - Post procedure medication orders were given. Procedure Code(s):        --- Professional ---                           [phone], Esophagogastroduodenoscopy, flexible,                            transoral; with insertion of guide wire followed by                            passage of dilator(s) through esophagus over guide                            wire                           G0500, Moderate sedation services provided by the                            same physician or other qualified health care                            professional performing a gastrointestinal                            endoscopic service that sedation supports,                            requiring the presence of an independent trained                            observer to assist in the monitoring of the                            patient's level of consciousness and physiological                            status; initial 15 minutes of intra-service time;                            patient age 69 years or older (additional time may                            be reported with 01779, as appropriate) Diagnosis Code(s):        --- Professional ---                            K22.2, Esophageal obstruction                           R13.10, Dysphagia, unspecified CPT copyright 2016 [employer]. All rights reserved. The codes documented in this report are preliminary and upon coder review may  be revised to meet current compliance requirements. Venita Lick. Russella Dar, MD [name], MD 02/28/2016 12:36:03 PM This report has been signed electronically. Number of Addenda: 0

## 2016-02-28 NOTE — Discharge Instructions (Signed)
Decrease lasix to 20mg  daily Resume Lisinopril Resume Pradaxa on 03/03/16 Come for labs on 03/06/16 to recheck kidney function Call Dr Ludwig Clarks office with any questions - [phone]

## 2016-02-28 NOTE — Progress Notes (Signed)
EGD revealed distal stricture and candida; pt required esophageal dilatation; Dr Stark recommended on further instrumentation today; will postpone EGD for 2 weeks. Decrease lasix to 20 mg daily and resume lisinopril, repeat BMET one week. May need to decrease pradaxa if renal function does not improve. Per Dr Stark, pradaxa will need to be held until Thursday AM and then resume. Debra, can you arrange TEE in 2 weeks for pre watchman eval? Brian Crenshaw 

## 2016-02-29 ENCOUNTER — Telehealth: Payer: Self-pay

## 2016-02-29 ENCOUNTER — Encounter (HOSPITAL_COMMUNITY): Payer: Self-pay | Admitting: Gastroenterology

## 2016-02-29 ENCOUNTER — Telehealth: Payer: Self-pay | Admitting: *Deleted

## 2016-02-29 ENCOUNTER — Other Ambulatory Visit: Payer: Self-pay | Admitting: Cardiology

## 2016-02-29 ENCOUNTER — Telehealth: Payer: Self-pay | Admitting: Internal Medicine

## 2016-02-29 MED ORDER — FLUCONAZOLE 100 MG PO TABS: 100.0000 mg | ORAL_TABLET | Freq: Every day | ORAL | Status: DC

## 2016-02-29 MED ORDER — NITROGLYCERIN 0.4 MG SL SUBL: 0.4000 mg | SUBLINGUAL_TABLET | SUBLINGUAL | Status: DC | PRN

## 2016-02-29 MED ORDER — FUROSEMIDE 20 MG PO TABS: 20.0000 mg | ORAL_TABLET | Freq: Every day | ORAL | Status: DC

## 2016-02-29 NOTE — Telephone Encounter (Signed)
Rx request sent to pharmacy.  

## 2016-02-29 NOTE — Telephone Encounter (Signed)
-----   Message from [name], MD sent at 02/28/2016  4:32 PM EDT ----- See EGD report. VN patient I saw today.  Please send in Diflucan 100 mg po daily, #7.

## 2016-02-29 NOTE — Telephone Encounter (Signed)
EGD revealed distal stricture and candida; pt required esophageal dilatation; Dr Russella Dar recommended on further instrumentation today; will postpone EGD for 2 weeks. Decrease lasix to 20 mg daily and resume lisinopril, repeat BMET one week. May need to decrease pradaxa if renal function does not improve. Per Dr Russella Dar, pradaxa will need to be held until Thursday AM and then resume. Stanton Kidney, can you arrange TEE in 2 weeks for pre watchman eval? Olga Millers

## 2016-02-29 NOTE — Telephone Encounter (Signed)
Spoke with [facility] in [place] and they have the diflucan rx ready for pick up.  I spoke with Khoi and let him know.  Left Debbie a message that the rx was ready also.

## 2016-03-01 ENCOUNTER — Encounter: Payer: Self-pay | Admitting: *Deleted

## 2016-03-01 ENCOUNTER — Other Ambulatory Visit: Payer: Self-pay | Admitting: Cardiology

## 2016-03-01 DIAGNOSIS — I480 Paroxysmal atrial fibrillation: Secondary | ICD-10-CM

## 2016-03-01 NOTE — Telephone Encounter (Signed)
Pt scheduled for TEE with dr Duke Salvia 03-15-16 @ 9am. Instructions reviewed with pts dtr, questions answered. Letter of instructions mailed to pt.

## 2016-03-12 ENCOUNTER — Emergency Department (HOSPITAL_COMMUNITY): Payer: Medicare Other

## 2016-03-12 ENCOUNTER — Inpatient Hospital Stay (HOSPITAL_COMMUNITY)
Admission: EM | Admit: 2016-03-12 | Discharge: 2016-03-20 | DRG: 291 | Disposition: A | Discharge status: Home / Self Care | Payer: Medicare Other | Attending: Cardiology | Admitting: Cardiology

## 2016-03-12 ENCOUNTER — Encounter (HOSPITAL_COMMUNITY): Payer: Self-pay

## 2016-03-12 ENCOUNTER — Other Ambulatory Visit: Payer: Self-pay

## 2016-03-12 DIAGNOSIS — R609 Edema, unspecified: Secondary | ICD-10-CM

## 2016-03-12 DIAGNOSIS — R0602 Shortness of breath: Secondary | ICD-10-CM

## 2016-03-12 DIAGNOSIS — I714 Abdominal aortic aneurysm, without rupture, unspecified: Secondary | ICD-10-CM | POA: Diagnosis present

## 2016-03-12 DIAGNOSIS — I482 Chronic atrial fibrillation: Secondary | ICD-10-CM | POA: Diagnosis present

## 2016-03-12 DIAGNOSIS — N179 Acute kidney failure, unspecified: Secondary | ICD-10-CM | POA: Diagnosis present

## 2016-03-12 DIAGNOSIS — K922 Gastrointestinal hemorrhage, unspecified: Secondary | ICD-10-CM | POA: Diagnosis present

## 2016-03-12 DIAGNOSIS — N183 Chronic kidney disease, stage 3 unspecified: Secondary | ICD-10-CM | POA: Diagnosis present

## 2016-03-12 DIAGNOSIS — I25810 Atherosclerosis of coronary artery bypass graft(s) without angina pectoris: Secondary | ICD-10-CM | POA: Diagnosis present

## 2016-03-12 DIAGNOSIS — I509 Heart failure, unspecified: Secondary | ICD-10-CM

## 2016-03-12 DIAGNOSIS — I130 Hypertensive heart and chronic kidney disease with heart failure and stage 1 through stage 4 chronic kidney disease, or unspecified chronic kidney disease: Secondary | ICD-10-CM | POA: Diagnosis not present

## 2016-03-12 DIAGNOSIS — Z885 Allergy status to narcotic agent status: Secondary | ICD-10-CM

## 2016-03-12 DIAGNOSIS — J9601 Acute respiratory failure with hypoxia: Secondary | ICD-10-CM | POA: Diagnosis not present

## 2016-03-12 DIAGNOSIS — Z91199 Patient's noncompliance with other medical treatment and regimen due to unspecified reason: Secondary | ICD-10-CM

## 2016-03-12 DIAGNOSIS — I447 Left bundle-branch block, unspecified: Secondary | ICD-10-CM | POA: Diagnosis present

## 2016-03-12 DIAGNOSIS — Z86718 Personal history of other venous thrombosis and embolism: Secondary | ICD-10-CM

## 2016-03-12 DIAGNOSIS — I255 Ischemic cardiomyopathy: Secondary | ICD-10-CM | POA: Diagnosis present

## 2016-03-12 DIAGNOSIS — I472 Ventricular tachycardia: Secondary | ICD-10-CM | POA: Diagnosis not present

## 2016-03-12 DIAGNOSIS — J449 Chronic obstructive pulmonary disease, unspecified: Secondary | ICD-10-CM | POA: Diagnosis present

## 2016-03-12 DIAGNOSIS — I252 Old myocardial infarction: Secondary | ICD-10-CM

## 2016-03-12 DIAGNOSIS — Z8673 Personal history of transient ischemic attack (TIA), and cerebral infarction without residual deficits: Secondary | ICD-10-CM

## 2016-03-12 DIAGNOSIS — E876 Hypokalemia: Secondary | ICD-10-CM | POA: Diagnosis present

## 2016-03-12 DIAGNOSIS — I4821 Permanent atrial fibrillation: Secondary | ICD-10-CM | POA: Diagnosis present

## 2016-03-12 DIAGNOSIS — Z888 Allergy status to other drugs, medicaments and biological substances status: Secondary | ICD-10-CM

## 2016-03-12 DIAGNOSIS — I480 Paroxysmal atrial fibrillation: Secondary | ICD-10-CM

## 2016-03-12 DIAGNOSIS — Z951 Presence of aortocoronary bypass graft: Secondary | ICD-10-CM

## 2016-03-12 DIAGNOSIS — L03116 Cellulitis of left lower limb: Secondary | ICD-10-CM | POA: Diagnosis present

## 2016-03-12 DIAGNOSIS — I5023 Acute on chronic systolic (congestive) heart failure: Secondary | ICD-10-CM | POA: Diagnosis not present

## 2016-03-12 DIAGNOSIS — Z95810 Presence of automatic (implantable) cardiac defibrillator: Secondary | ICD-10-CM

## 2016-03-12 DIAGNOSIS — Z79899 Other long term (current) drug therapy: Secondary | ICD-10-CM

## 2016-03-12 DIAGNOSIS — Z823 Family history of stroke: Secondary | ICD-10-CM

## 2016-03-12 DIAGNOSIS — N182 Chronic kidney disease, stage 2 (mild): Secondary | ICD-10-CM | POA: Diagnosis present

## 2016-03-12 DIAGNOSIS — Z9119 Patient's noncompliance with other medical treatment and regimen: Secondary | ICD-10-CM

## 2016-03-12 DIAGNOSIS — Z7901 Long term (current) use of anticoagulants: Secondary | ICD-10-CM

## 2016-03-12 DIAGNOSIS — I723 Aneurysm of iliac artery: Secondary | ICD-10-CM | POA: Diagnosis present

## 2016-03-12 DIAGNOSIS — I4729 Other ventricular tachycardia: Secondary | ICD-10-CM

## 2016-03-12 DIAGNOSIS — E7800 Pure hypercholesterolemia, unspecified: Secondary | ICD-10-CM | POA: Diagnosis present

## 2016-03-12 DIAGNOSIS — F419 Anxiety disorder, unspecified: Secondary | ICD-10-CM | POA: Diagnosis present

## 2016-03-12 DIAGNOSIS — F17210 Nicotine dependence, cigarettes, uncomplicated: Secondary | ICD-10-CM | POA: Diagnosis present

## 2016-03-12 DIAGNOSIS — E785 Hyperlipidemia, unspecified: Secondary | ICD-10-CM | POA: Diagnosis present

## 2016-03-12 DIAGNOSIS — I2510 Atherosclerotic heart disease of native coronary artery without angina pectoris: Secondary | ICD-10-CM | POA: Diagnosis present

## 2016-03-12 DIAGNOSIS — F329 Major depressive disorder, single episode, unspecified: Secondary | ICD-10-CM | POA: Diagnosis present

## 2016-03-12 HISTORY — DX: Chronic kidney disease, unspecified: N18.9

## 2016-03-12 HISTORY — DX: Paroxysmal atrial fibrillation: I48.0

## 2016-03-12 LAB — CBC WITH DIFFERENTIAL/PLATELET
Basophils Absolute: 0 10*3/uL (ref 0.0–0.1)
Basophils Relative: 1 %
EOS ABS: 0.2 10*3/uL (ref 0.0–0.7)
EOS PCT: 3 %
HCT: 38.4 % — ABNORMAL LOW (ref 39.0–52.0)
Hemoglobin: 12.1 g/dL — ABNORMAL LOW (ref 13.0–17.0)
LYMPHS ABS: 1.6 10*3/uL (ref 0.7–4.0)
LYMPHS PCT: 20 %
MCH: 27.3 pg (ref 26.0–34.0)
MCHC: 31.5 g/dL (ref 30.0–36.0)
MCV: 86.5 fL (ref 78.0–100.0)
MONOS PCT: 7 %
Monocytes Absolute: 0.5 10*3/uL (ref 0.1–1.0)
Neutro Abs: 5.3 10*3/uL (ref 1.7–7.7)
Neutrophils Relative %: 69 %
PLATELETS: 178 10*3/uL (ref 150–400)
RBC: 4.44 MIL/uL (ref 4.22–5.81)
RDW: 17.2 % — ABNORMAL HIGH (ref 11.5–15.5)
WBC: 7.7 10*3/uL (ref 4.0–10.5)

## 2016-03-12 LAB — BASIC METABOLIC PANEL
ANION GAP: 12 (ref 5–15)
BUN: 22 mg/dL — ABNORMAL HIGH (ref 6–20)
CO2: 27 mmol/L (ref 22–32)
Calcium: 8.2 mg/dL — ABNORMAL LOW (ref 8.9–10.3)
Chloride: 107 mmol/L (ref 101–111)
Creatinine, Ser: 1.54 mg/dL — ABNORMAL HIGH (ref 0.61–1.24)
GFR calc Af Amer: 50 mL/min — ABNORMAL LOW (ref >60)
GFR calc non Af Amer: 43 mL/min — ABNORMAL LOW (ref >60)
GLUCOSE: 121 mg/dL — AB (ref 65–99)
Potassium: 3 mmol/L — ABNORMAL LOW (ref 3.5–5.1)
Sodium: 146 mmol/L — ABNORMAL HIGH (ref 135–145)

## 2016-03-12 LAB — BRAIN NATRIURETIC PEPTIDE: B Natriuretic Peptide: 1859.3 pg/mL — ABNORMAL HIGH (ref 0.0–100.0)

## 2016-03-12 LAB — I-STAT TROPONIN, ED: TROPONIN I, POC: 0.02 ng/mL (ref 0.00–0.08)

## 2016-03-12 LAB — MRSA PCR SCREENING: MRSA BY PCR: POSITIVE — AB

## 2016-03-12 LAB — POTASSIUM: Potassium: 3.1 mmol/L — ABNORMAL LOW (ref 3.5–5.1)

## 2016-03-12 MED ORDER — SODIUM CHLORIDE 0.9% FLUSH: 3.0000 mL | INTRAVENOUS | Status: DC | PRN

## 2016-03-12 MED ORDER — FUROSEMIDE 10 MG/ML IJ SOLN
40.0000 mg | Freq: Two times a day (BID) | INTRAMUSCULAR | Status: DC
  Administered 2016-03-13: 40 mg via INTRAVENOUS
  Filled 2016-03-12: qty 4

## 2016-03-12 MED ORDER — PRAVASTATIN SODIUM 40 MG PO TABS
80.0000 mg | ORAL_TABLET | Freq: Every day | ORAL | Status: DC
  Administered 2016-03-13 – 2016-03-20 (×8): 80 mg via ORAL
  Filled 2016-03-12 (×8): qty 2

## 2016-03-12 MED ORDER — IPRATROPIUM-ALBUTEROL 0.5-2.5 (3) MG/3ML IN SOLN: 3.0000 mL | Freq: Once | RESPIRATORY_TRACT | Status: DC

## 2016-03-12 MED ORDER — ALBUTEROL SULFATE (2.5 MG/3ML) 0.083% IN NEBU
3.0000 mL | INHALATION_SOLUTION | RESPIRATORY_TRACT | Status: DC | PRN
  Administered 2016-03-15 – 2016-03-16 (×2): 3 mL via RESPIRATORY_TRACT
  Filled 2016-03-12 (×2): qty 3

## 2016-03-12 MED ORDER — PANTOPRAZOLE SODIUM 40 MG PO TBEC
40.0000 mg | DELAYED_RELEASE_TABLET | Freq: Two times a day (BID) | ORAL | Status: DC
  Administered 2016-03-12 – 2016-03-20 (×16): 40 mg via ORAL
  Filled 2016-03-12 (×16): qty 1

## 2016-03-12 MED ORDER — AMIODARONE HCL 200 MG PO TABS
200.0000 mg | ORAL_TABLET | Freq: Every day | ORAL | Status: DC
  Administered 2016-03-13 – 2016-03-20 (×8): 200 mg via ORAL
  Filled 2016-03-12 (×8): qty 1

## 2016-03-12 MED ORDER — ACETAMINOPHEN 325 MG PO TABS: 650.0000 mg | ORAL_TABLET | ORAL | Status: DC | PRN

## 2016-03-12 MED ORDER — HYDRALAZINE HCL 25 MG PO TABS: 25.0000 mg | ORAL_TABLET | Freq: Three times a day (TID) | ORAL | Status: DC

## 2016-03-12 MED ORDER — FUROSEMIDE 10 MG/ML IJ SOLN
40.0000 mg | Freq: Once | INTRAMUSCULAR | Status: AC
Start: 2016-03-12 — End: 2016-03-12
  Administered 2016-03-12: 40 mg via INTRAVENOUS
  Filled 2016-03-12: qty 4

## 2016-03-12 MED ORDER — NIACIN ER 500 MG PO CPCR
1000.0000 mg | ORAL_CAPSULE | Freq: Two times a day (BID) | ORAL | Status: DC
  Administered 2016-03-12 – 2016-03-20 (×16): 1000 mg via ORAL
  Filled 2016-03-12 (×18): qty 2

## 2016-03-12 MED ORDER — AMLODIPINE BESYLATE 10 MG PO TABS
10.0000 mg | ORAL_TABLET | Freq: Every day | ORAL | Status: DC
  Administered 2016-03-13 – 2016-03-18 (×6): 10 mg via ORAL
  Filled 2016-03-12: qty 1
  Filled 2016-03-12: qty 2
  Filled 2016-03-12 (×4): qty 1

## 2016-03-12 MED ORDER — METOPROLOL SUCCINATE ER 25 MG PO TB24
25.0000 mg | ORAL_TABLET | Freq: Every day | ORAL | Status: DC
  Administered 2016-03-13 – 2016-03-14 (×2): 25 mg via ORAL
  Filled 2016-03-12 (×2): qty 1

## 2016-03-12 MED ORDER — SODIUM CHLORIDE 0.9% FLUSH
3.0000 mL | Freq: Two times a day (BID) | INTRAVENOUS | Status: DC
  Administered 2016-03-12 – 2016-03-20 (×13): 3 mL via INTRAVENOUS

## 2016-03-12 MED ORDER — ONDANSETRON HCL 4 MG/2ML IJ SOLN
4.0000 mg | Freq: Four times a day (QID) | INTRAMUSCULAR | Status: DC | PRN
Start: 2016-03-12 — End: 2016-03-20

## 2016-03-12 MED ORDER — ALBUTEROL SULFATE (2.5 MG/3ML) 0.083% IN NEBU
5.0000 mg | INHALATION_SOLUTION | Freq: Once | RESPIRATORY_TRACT | Status: AC
  Administered 2016-03-12: 5 mg via RESPIRATORY_TRACT
  Filled 2016-03-12: qty 6

## 2016-03-12 MED ORDER — POTASSIUM CHLORIDE CRYS ER 20 MEQ PO TBCR
60.0000 meq | EXTENDED_RELEASE_TABLET | Freq: Once | ORAL | Status: AC
  Administered 2016-03-12: 60 meq via ORAL
  Filled 2016-03-12: qty 3

## 2016-03-12 MED ORDER — ADULT MULTIVITAMIN W/MINERALS CH
1.0000 | ORAL_TABLET | Freq: Every day | ORAL | Status: DC
  Administered 2016-03-13 – 2016-03-20 (×8): 1 via ORAL
  Filled 2016-03-12 (×8): qty 1

## 2016-03-12 MED ORDER — SODIUM CHLORIDE 0.9 % IV SOLN
250.0000 mL | INTRAVENOUS | Status: DC | PRN
Start: 2016-03-12 — End: 2016-03-20

## 2016-03-12 MED ORDER — HEPARIN (PORCINE) IN NACL 100-0.45 UNIT/ML-% IJ SOLN
1600.0000 [IU]/h | INTRAMUSCULAR | Status: DC
  Administered 2016-03-12: 1500 [IU]/h via INTRAVENOUS
  Filled 2016-03-12 (×2): qty 250

## 2016-03-12 MED ORDER — IPRATROPIUM BROMIDE 0.02 % IN SOLN
0.5000 mg | Freq: Once | RESPIRATORY_TRACT | Status: AC
  Administered 2016-03-12: 0.5 mg via RESPIRATORY_TRACT
  Filled 2016-03-12: qty 2.5

## 2016-03-12 MED ORDER — HYDRALAZINE HCL 50 MG PO TABS
50.0000 mg | ORAL_TABLET | Freq: Three times a day (TID) | ORAL | Status: DC
  Administered 2016-03-12 – 2016-03-20 (×23): 50 mg via ORAL
  Filled 2016-03-12 (×24): qty 1

## 2016-03-12 MED ORDER — CHLORHEXIDINE GLUCONATE CLOTH 2 % EX PADS
6.0000 | MEDICATED_PAD | Freq: Every day | CUTANEOUS | Status: AC
  Administered 2016-03-13 – 2016-03-17 (×5): 6 via TOPICAL

## 2016-03-12 MED ORDER — MUPIROCIN 2 % EX OINT
1.0000 | TOPICAL_OINTMENT | Freq: Two times a day (BID) | CUTANEOUS | Status: AC
Start: 2016-03-12 — End: 2016-03-17
  Administered 2016-03-12 – 2016-03-17 (×10): 1 via NASAL
  Filled 2016-03-12 (×4): qty 22

## 2016-03-12 MED ORDER — TIOTROPIUM BROMIDE MONOHYDRATE 18 MCG IN CAPS
18.0000 ug | ORAL_CAPSULE | Freq: Every day | RESPIRATORY_TRACT | Status: DC
  Administered 2016-03-13 – 2016-03-20 (×7): 18 ug via RESPIRATORY_TRACT
  Filled 2016-03-12 (×2): qty 5

## 2016-03-12 MED ORDER — IPRATROPIUM-ALBUTEROL 0.5-2.5 (3) MG/3ML IN SOLN
3.0000 mL | RESPIRATORY_TRACT | Status: DC | PRN
  Administered 2016-03-13 – 2016-03-20 (×9): 3 mL via RESPIRATORY_TRACT
  Filled 2016-03-12 (×9): qty 3

## 2016-03-12 MED ORDER — CITALOPRAM HYDROBROMIDE 10 MG PO TABS
10.0000 mg | ORAL_TABLET | Freq: Every day | ORAL | Status: DC
  Administered 2016-03-13 – 2016-03-20 (×8): 10 mg via ORAL
  Filled 2016-03-12 (×8): qty 1

## 2016-03-12 MED ORDER — POTASSIUM CHLORIDE CRYS ER 20 MEQ PO TBCR
40.0000 meq | EXTENDED_RELEASE_TABLET | Freq: Three times a day (TID) | ORAL | Status: DC
  Administered 2016-03-12 – 2016-03-20 (×23): 40 meq via ORAL
  Filled 2016-03-12 (×23): qty 2

## 2016-03-12 NOTE — ED Provider Notes (Signed)
Medical screening examination/treatment/procedure(s) were conducted as a shared visit with non-physician practitioner(s) and myself.  I personally evaluated the patient during the encounter.   EKG Interpretation   Date/Time:  Sunday March 12 2016 15:55:47 EDT Ventricular Rate:  106 PR Interval:    QRS Duration: 164 QT Interval:  442 QTC Calculation: 587 R Axis:   105 Text Interpretation:  Atrial fibrillation Left bundle branch block No  significant change since last tracing Confirmed by Betsy Rosello MD, Reuel Boom  [phone]) on 03/12/2016 4:03:44 PM       See the written copy of this report in the patient's paper medical record.  These results did not interface directly into the electronic medical record and are summarized here.  75 yo M with a chief complaint of shortness of breath. This been going on for the past couple days. Associated with significant lower extremity edema. Patient is doubled up on his Lasix without improvement. On my exam patient with 4+ pitting edema up to the thighs JVD up to the angle of the jaw. Patient has wheezes in all lung fields. No S3. Difficult to appreciate rales with underlying lung disease. Suspect this is most likely a CHF exacerbation. Patient has had some significant chest tightness with this. He is not sure if this feels like his last MI or not. Cardiology consult will admit.  Melene Plan, DO 03/12/16 [phone]

## 2016-03-12 NOTE — ED Notes (Signed)
Per EMS: Pt coming from home, complaining of SOB. Pt admits to smoking today. Has bilateral edema in legs. EMS gave 2 duoneb, 1 aluterol and 125mg  solumedrol. Pt has wheezing throughout. Hx: CHF, COPD, Stroke, MI, 5Bypasses.

## 2016-03-12 NOTE — Progress Notes (Signed)
RN notified by lab that pt MRSA positive, order set placed per protocol.  Pt potassium 3.1 as well, pt already give PO potassium after lab drawn, prior to resulting, MD on call notified.   Pt refused bed alarm tonight. Pt A/Ox4, assist x1 to get up. Pt states he will call RN if needing to get up.

## 2016-03-12 NOTE — Progress Notes (Signed)
ANTICOAGULATION CONSULT NOTE - Initial Consult  Pharmacy Consult for heparin Indication: atrial fibrillation  Allergies  Allergen Reactions  . Buprenorphine Hcl Other (See Comments)    Patient stated that he did not do well on this medication. He felt like climbing the walls  . Morphine And Related Other (See Comments)    Patient stated that he did not do well on this medication. He felt like climbing the walls    Patient Measurements: Height: 6\' 1"  (185.4 cm) Weight: 221 lb 9.6 oz (100.517 kg) (Scale A) IBW/kg (Calculated) : 79.9 Heparin Dosing Weight: 100.1kg  Vital Signs: Temp: 97.7 F (36.5 C) (04/02 1910) Temp Source: Oral (04/02 1910) BP: 132/92 mmHg (04/02 1910) Pulse Rate: 105 (04/02 1910)  Labs:  Recent Labs  03/12/16 1630  HGB 12.1*  HCT 38.4*  PLT 178  CREATININE 1.54*    Estimated Creatinine Clearance: 53.2 mL/min (by C-G formula based on Cr of 1.54).   Medical History: Past Medical History  Diagnosis Date  . Atrial fibrillation (HCC)     s/p prior DCCV;  Amiodarone Rx.  . CAD (coronary artery disease)     s/p CABG;  cath 10/07:  LM ok, LAD occluded, AV CFX 95%, pRCA occluded; L-LAD, S-OM2/OM3, S-PDA ok  . Cerebrovascular disease     s/p prior Left CEA;  followed by Dr. [name]  . COPD (chronic obstructive pulmonary disease) (HCC)   . Hyperlipidemia   . Hypertension   . Mitral insufficiency     hx of. not noted on echo 01/2012  . Nephrolithiasis   . Ischemic cardiomyopathy     echo 5/09: EF 55%, mild LAE;  Myoview 11/10: inf and apical scar with mild peri-infarct ischemia, EF 44%. Echo 01/26/12 with EF 20-25% but previously known to be 30% on echoes before last one  . Iliac artery aneurysm, left (HCC)     followed by Dr. [name]  . Aortic dissection (HCC)     H/O focal aortic dissection  . LBBB (left bundle branch block)   . CHF (congestive heart failure) (HCC)   . Myocardial infarction (HCC)   . Renal insufficiency     01/2012  . Stroke (HCC)    . CVA (cerebral infarction)   . DVT (deep venous thrombosis) (HCC)   . Fall from slipping on wet surface Apr 15, 2015    Fx 3 Vertabrea  . AICD (automatic cardioverter/defibrillator) present     Assessment: 62 yom admitted w/ dyspnea. On Pradaxa pta for afib (last dose 4/2 at 0800). Pharmacy consulted to dose heparin inpatient. Hg 12.1, plt wnl. No bleed documented. CrCl is >30, so ok to start heparin now (12 hours after last Pradaxa dose).  Goal of Therapy:  Heparin level 0.3-0.7 units/ml Monitor platelets by anticoagulation protocol: Yes   Plan:  Heparin at 1500 units/h (no bolus) 6h HL, daily HL/CBC Monitor s/sx bleeding  Babs Bertin, PharmD, [facility] Clinical Pharmacist Pager [phone] 03/12/2016 9:16 PM

## 2016-03-12 NOTE — Consult Note (Signed)
History & Physical    Patient ID: [name] [name] MRN: [id number], DOB/AGE: December 11, 1941  Admit date: 03/12/2016 Primary Physician: [name], MD Primary Cardiologist:  Dr. [name]  CC:  Dyspnea  History of Present Illness    75 yo M pt of Dr. 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  He was admitted 01/21/16 to 01/22/16 with hematemesis attributed to a mallory-weiss tear. He had visited Dr. Ludwig Clarks clinic 02/09/16 when the possibility of a Watchman was addressed.  He was set up for a simultaneous EGD/TEE on 02/28/16.  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 The TEE was delayed another 2 weeks.  At that time, his Furosemide was decreased from 40 to 20 mg daily, & he was started on Diflucan.    Since that time, he noted progressive dyspnea, lower extremity swelling, abdominal distention, orthopnea, paroxysmal nocturnal dyspnea, & weight gain.  He tried increasing his Furosemide back to 40 mg daily but felt that this had little effect.  This culminated today in an episode of chest tightness that occurred while he was driving, 1-2 hours prior to presentation to the ED. He denied associated symptoms. This felt different than prior anginal pain.  He admitted to smoking at the time, which he has pick back up in the past couple of months, smoking 1 pack per week.  He has a chronic cough, related to his emphysema, which has not changed recently.  He denied fevers or sick contacts.  Since having his esophagus dilated, he has had an increase in his appetite but not enough to account for his recent weight gain.  He does not add salt to his food but  does not necessarily avoid high-salt foods.  He is compliant with his medications.  Otherwise, he denied recent palpitations, lightheadedness, syncope, or ICD fire.    Per EMS, he was given Duoneb x 1, Albuterol, & Solumedrol 125.  While in th ED, he was observed to have increased work of breathing without overt hypoxia. IN the ED, he was given additional nebulizer treatment, Furosemide 40 mg IV x 1 & Kdur 60 mEq x 1.    Relevant Data - SJM CRT interrogation 01/25/16:  AT/AF burden increased from 4.8 % to >99%  - Lexiscan 11/2015:  EF 25%, medium-size, severe-severity scar in the basal inferior, mid inferior, & apical areas  - Cardiac cath 09/2006:  LM ok, LAD occluded, AV CFX 95%, pRCA occluded; L-LAD, S-OM2/OM3, S-PDA ok   Past Medical History   Past Medical History  Diagnosis Date  . Atrial fibrillation (HCC)     s/p prior DCCV;  Amiodarone Rx.  . CAD (coronary artery disease)     s/p CABG;  cath 10/07:  LM ok, LAD occluded, AV CFX 95%, pRCA occluded; L-LAD, S-OM2/OM3, S-PDA ok  . Cerebrovascular disease     s/p prior Left CEA;  followed by Dr. [name]  . COPD (chronic obstructive pulmonary disease) (HCC)   . Hyperlipidemia   . Hypertension   . Mitral insufficiency     hx of. not noted on echo 01/2012  . Nephrolithiasis   . Ischemic cardiomyopathy  echo 5/09: EF 55%, mild LAE;  Myoview 11/10: inf and apical scar with mild peri-infarct ischemia, EF 44%. Echo 01/26/12 with EF 20-25% but previously known to be 30% on echoes before last one  . Iliac artery aneurysm, left (HCC)     followed by Dr. [name]  . Aortic dissection (HCC)     H/O focal aortic dissection  . LBBB (left bundle branch block)   . CHF (congestive heart failure) (HCC)   . Myocardial infarction (HCC)   . Renal insufficiency     01/2012  . Stroke (HCC)   . CVA (cerebral infarction)   . DVT (deep venous thrombosis) (HCC)   . Fall from slipping on wet surface Apr 15, 2015    Fx 3 Vertabrea  . AICD (automatic  cardioverter/defibrillator) present     Past Surgical History  Procedure Laterality Date  . Uteroscopy    . Coronary artery bypass graft  1998  . Appendectomy  09/11/11  . Biventricular defibrillator implantation  08/22/12    SJM Quadra Assura BiV ICD implanted by Dr [name]  . Spine surgery  Sep 27, 2014    Lower Back L 4  and   L 5  . Bi-ventricular implantable cardioverter defibrillator N/A 08/22/2012    Procedure: BI-VENTRICULAR IMPLANTABLE CARDIOVERTER DEFIBRILLATOR  (CRT-D);  Surgeon: [name], MD;  Location: [facility] CATH LAB;  Service: Cardiovascular;  Laterality: N/A;  . Carotid endarterectomy  July  2005    left  . Cardiac catheterization  2013  . Abdominal aortic aneurysm repair N/A 01/03/2016    Procedure: ANEURYSM ABDOMINAL AORTA, BILATERAL COMMON ILIAC REPAIR;  Surgeon: [name], MD;  Location: [facility] OR;  Service: Vascular;  Laterality: N/A;  . Esophagogastroduodenoscopy N/A 02/28/2016    Procedure: ESOPHAGOGASTRODUODENOSCOPY (EGD);  Surgeon: [name], MD;  Location: [facility] ENDOSCOPY;  Service: Endoscopy;  Laterality: N/A;    Allergies  Allergies  Allergen Reactions  . Buprenorphine Hcl Other (See Comments)    Patient stated that he did not do well on this medication. He felt like climbing the walls  . Morphine And Related Other (See Comments)    Patient stated that he did not do well on this medication. He felt like climbing the walls   Home Medications    Prior to Admission medications   Medication Sig Start Date End Date Taking? Authorizing Provider  albuterol (PROVENTIL HFA;VENTOLIN HFA) 108 (90 BASE) MCG/ACT inhaler Inhale 2 puffs into the lungs every 4 (four) hours as needed for wheezing. 03/29/13  Yes Cathren Laine, MD  amiodarone (PACERONE) 200 MG tablet Take 200 mg by mouth daily.   Yes Historical Provider, MD  amLODipine (NORVASC) 10 MG tablet Take 10 mg by mouth daily.   Yes Historical Provider, MD  citalopram (CELEXA) 10 MG tablet Take 10 mg by mouth daily.    Yes Historical Provider, MD  dabigatran (PRADAXA) 150 MG CAPS capsule Take 1 capsule (150 mg total) by mouth 2 (two) times daily. 03/08/15  Yes Lewayne Bunting, MD  furosemide (LASIX) 20 MG tablet Take 1 tablet (20 mg total) by mouth daily. Patient taking differently: Take 40 mg by mouth at bedtime.  02/29/16  Yes Lewayne Bunting, MD  hydrALAZINE (APRESOLINE) 50 MG tablet Take 0.5 tablets (25 mg total) by mouth 3 (three) times daily. 12/15/14  Yes Lewayne Bunting, MD  ipratropium-albuterol (DUONEB) 0.5-2.5 (3) MG/3ML SOLN Take 3 mLs by nebulization every 4 (four) hours as needed (for shortness of breath). 12/01/15  Yes  Leslye Peer, MD  lisinopril (PRINIVIL,ZESTRIL) 40 MG tablet TAKE 1 TABLET EVERY DAY 12/29/14  Yes Lewayne Bunting, MD  metoprolol succinate (TOPROL-XL) 25 MG 24 hr tablet Take 1 tablet (25 mg total) by mouth daily. Please schedule appointment for refills. 02/29/16  Yes Lewayne Bunting, MD  Multiple Vitamin (MULITIVITAMIN WITH MINERALS) TABS Take 1 tablet by mouth daily.   Yes Historical Provider, MD  niacin (NIASPAN) 500 MG CR tablet Take 1,000 mg by mouth 2 (two) times daily.    Yes Historical Provider, MD  pantoprazole (PROTONIX) 40 MG tablet Take 1 tablet (40 mg total) by mouth 2 (two) times daily. 01/22/16  Yes Leroy Sea, MD  potassium chloride SA (K-DUR,KLOR-CON) 20 MEQ tablet Take 2 tablets three times daily. Please schedule appointment for refills. 02/29/16  Yes Lewayne Bunting, MD  pravastatin (PRAVACHOL) 80 MG tablet Take 1 tablet (80 mg total) by mouth daily. 06/15/15  Yes Lewayne Bunting, MD  tiotropium (SPIRIVA HANDIHALER) 18 MCG inhalation capsule Place 1 capsule (18 mcg total) into inhaler and inhale daily. 10/29/15 10/28/16 Yes Leslye Peer, MD  fluconazole (DIFLUCAN) 100 MG tablet Take 1 tablet (100 mg total) by mouth daily. Patient not taking: Reported on 03/12/2016 02/29/16   Iva Boop, MD  furosemide (LASIX) 40 MG tablet Take 40 mg by mouth daily.  12/30/15    Historical Provider, MD  metoprolol tartrate (LOPRESSOR) 25 MG tablet Take 12.5 mg by mouth 2 (two) times daily.    Historical Provider, MD  nitroGLYCERIN (NITROSTAT) 0.4 MG SL tablet Place 1 tablet (0.4 mg total) under the tongue every 5 (five) minutes as needed for chest pain. 02/29/16   Lewayne Bunting, MD   Family History    Family History  Problem Relation Age of Onset  . Stroke Maternal Uncle   . Heart attack Neg Hx   . Hypertension Neg Hx   . Stroke Maternal Uncle    Social History    Social History   Social History  . Marital Status: Married    Spouse Name: N/A  . Number of Children: 3  . Years of Education: 10 TH   Occupational History  . RETIRED---truck driver    Social History Main Topics  . Smoking status: Former Smoker -- 1.50 packs/day for 57 years    Types: Cigarettes    Quit date: 12/11/2004  . Smokeless tobacco: Never Used  . Alcohol Use: No     Comment: 6 yrs ago   . Drug Use: No  . Sexual Activity: No   Other Topics Concern  . Not on file   Social History Narrative   Patient is married with 3 children.   Patient is right handed.   Patient has 10 th grade education.   Patient drinks tea occasionally.    Review of Systems    General:  No chills, fever, night sweats or weight changes.  Cardiovascular:    Positive for dyspnea, edema, orthopnea, & paroxysmal nocturnal dyspnea.  No chest pain or palpitations.   Dermatological: No rash, lesions/masses Respiratory: Positive of for dyspnea.   Urologic: No hematuria, dysuria Abdominal:   Positive for abdominal distention.  No nausea, vomiting, diarrhea, bright red blood per rectum, melena, or hematemesis Neurologic:  No visual changes, wkns, changes in mental status. All other systems reviewed and are otherwise negative except as noted above.  Physical Exam    Blood pressure 132/92, pulse 105, temperature 97.7 F (36.5 C), temperature source Oral,  resp. rate 20, height 6\' 1"  (1.854 m), weight  100.517 kg (221 lb 9.6 oz), SpO2 95 %.  General: Pleasant, NAD Psych: Normal affect. Neuro: Alert and oriented X 3. Moves all extremities spontaneously. HEENT: Normal  Neck: JVD to mid-neck. Lungs:  Rales bilateral lower lobes with scattered wheezes. Heart:  Irregularly irregular.  no s3, s4, or murmurs. Abdomen: Soft, non-tender, non-distended, BS + x 4.  Extremities:  3+ PE bilateral edema to the thighs.  No clubbing or cyanosis.  Difficult to palpate DP pulses.    Labs    Troponin (Point of Care Test)  Recent Labs  03/12/16 1640  TROPIPOC 0.02   No results for input(s): CKTOTAL, CKMB, TROPONINI in the last 72 hours. Lab Results  Component Value Date   WBC 7.7 03/12/2016   HGB 12.1* 03/12/2016   HCT 38.4* 03/12/2016   MCV 86.5 03/12/2016   PLT 178 03/12/2016    Recent Labs Lab 03/12/16 1630  NA 146*  K 3.0*  CL 107  CO2 27  BUN 22*  CREATININE 1.54*  CALCIUM 8.2*  GLUCOSE 121*   Lab Results  Component Value Date   CHOL 144 12/24/2014   HDL 38* 12/24/2014   LDLCALC 75 12/24/2014   TRIG 154* 12/24/2014   Lab Results  Component Value Date   DDIMER <0.22 01/25/2012   Radiology Studies    Dg Chest Portable 1 View  03/12/2016  CLINICAL DATA:  Shortness of breath and swelling in lower extremities. EXAM: PORTABLE CHEST 1 VIEW COMPARISON:  01/04/2016 FINDINGS: The heart is enlarged but stable. Stable pacer wires. Mild tortuosity and calcification of the thoracic aorta is noted. There is vascular congestion and mild perihilar and lower lobe interstitial pulmonary edema. No definite pleural effusions. IMPRESSION: Mild CHF. Electronically Signed   By: Rudie Meyer M.D.   On: 03/12/2016 16:37   Assessment & Plan    A/P:  75 yo M pt of Dr. 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    # Acute on chronic systolic heart failure, ischemic etiology - He ha NYHA III symptoms & is significantly volume overloaded supported by his BNP of 1859 compared to his baseline of 120's.  Also, his weight is up 8 kg compared to 2 weeks prior.  It is likely that his Afib is contributing to this as he is unable to receive the benefit of his CRT with his dysrhythmia. - We will plan to diurese as much as possible prior to consideration of DCCV. - Monitor strict I/O & daily weights. - As he is warm on physical exam, will continue his Metoprolol XL 25 mg daily for the benefit of rate control. - In light of his ongoing AKI, will hold his ACEi & up-titrate his Hydralazine to 50 mg PO TID for afterload reduction.  Could consider addition of a long-acting nitrate for completion of alternative to ACEi.  Hopefully with reduction in his CVP, his renal perfusion pressure will also improve.   - Will hold off on echocardiogram until he is slightly more euvolemic as the data will  be difficult to interpret in light of his excessive fluid.    # AKI - Improved from 02/28/16 but still ongoing compared to his baseline of 1.1 from 12/2015. - As per above, will monitor response to diuresis & hold Lisinopril.   # h/o CAD s/p CABG - There is no evidence of overt ischemia on his EKG.  Today's episode of chest tightness was atypical.  His troponin today was 0.02. - He is not on home ASA due to anticoagulation.   - Continue his home Metoprolol & Pravastatin.    # h/o pAfib - CHADS2-VASc=7.  Tachycardic.  As per above, will plan to diurese prior to consideration of DCCV.   - Will transition from Pradaxa to Heparin this admission.   - Continue home Amiodarone & Metoprolol.  # h/o HTN -  Normotensive. - Will continue his current Amlodipine & Metoprolol. - We will hold Lisinopril & up-titrate Hydralazine.    # h/o HLD - Per lipid panel 12/24/14, LDL 75.   - Will continue home Pravastatin & Niacin.    # h/o COPD - His cough is at baseline.  He does not require home O2.   - Continue home Spiriva & Nebs. - Perhaps the need for a LABA could be addressed this admission.    # h/o Depression/Anxiety - Will continue home Celexa.   # PPX - Protonix, Heparin  # Full code  Signed, Julaine Hua, MD 03/12/2016, 9:06 PM

## 2016-03-12 NOTE — ED Provider Notes (Signed)
CSN: [id number]     Arrival date & time 03/12/16  1547 History   First MD Initiated Contact with Patient 03/12/16 1555     Chief Complaint  Patient presents with  . Shortness of Breath    HPI  NEER HANSON is an 75 y.o. male with history of afib, CAD, AICD, CHF, COPD, HTN, H LD, stroke, on Pradaxa who presents to the ED for evaluation of SOB. Mr. Hodor states that sometime last week he noticed his legs and feet starting to swell. He states that today approximately 1-2 hours prior to arrival he suddenly started feeling like his chest was tight and like he couldn't breathe.He states he was working on some stuff around the house when it started. He states that it does not feel like pain but feels like someone is trying to "suffocate his breathing." Denies diaphoresis. Denies abdominal pain, n/v/d. Denies feeling faint or lightheaded. EMS reports he had diffuse wheezing on arrival and was given 2 duonebs, 1 albuterol, and 125mg  solu medrol. Pt states that provided minimal improvement in how he feels.   Cards: [facility] 2D echo 12/2014 with LV EF 40-45%  Past Medical History  Diagnosis Date  . Atrial fibrillation (HCC)     s/p prior DCCV;  Amiodarone Rx.  . CAD (coronary artery disease)     s/p CABG;  cath 10/07:  LM ok, LAD occluded, AV CFX 95%, pRCA occluded; L-LAD, S-OM2/OM3, S-PDA ok  . Cerebrovascular disease     s/p prior Left CEA;  followed by Dr. [name]  . COPD (chronic obstructive pulmonary disease) (HCC)   . Hyperlipidemia   . Hypertension   . Mitral insufficiency     hx of. not noted on echo 01/2012  . Nephrolithiasis   . Ischemic cardiomyopathy     echo 5/09: EF 55%, mild LAE;  Myoview 11/10: inf and apical scar with mild peri-infarct ischemia, EF 44%. Echo 01/26/12 with EF 20-25% but previously known to be 30% on echoes before last one  . Iliac artery aneurysm, left (HCC)     followed by Dr. [name]  . Aortic dissection (HCC)     H/O focal aortic dissection  . LBBB (left  bundle branch block)   . CHF (congestive heart failure) (HCC)   . Myocardial infarction (HCC)   . Renal insufficiency     01/2012  . Stroke (HCC)   . CVA (cerebral infarction)   . DVT (deep venous thrombosis) (HCC)   . Fall from slipping on wet surface Apr 15, 2015    Fx 3 Vertabrea  . AICD (automatic cardioverter/defibrillator) present    Past Surgical History  Procedure Laterality Date  . Uteroscopy    . Coronary artery bypass graft  1998  . Appendectomy  09/11/11  . Biventricular defibrillator implantation  08/22/12    SJM Quadra Assura BiV ICD implanted by Dr [name]  . Spine surgery  Sep 27, 2014    Lower Back L 4  and   L 5  . Bi-ventricular implantable cardioverter defibrillator N/A 08/22/2012    Procedure: BI-VENTRICULAR IMPLANTABLE CARDIOVERTER DEFIBRILLATOR  (CRT-D);  Surgeon: [name], MD;  Location: [facility] CATH LAB;  Service: Cardiovascular;  Laterality: N/A;  . Carotid endarterectomy  July  2005    left  . Cardiac catheterization  2013  . Abdominal aortic aneurysm repair N/A 01/03/2016    Procedure: ANEURYSM ABDOMINAL AORTA, BILATERAL COMMON ILIAC REPAIR;  Surgeon: [name], MD;  Location: [facility] OR;  Service: Vascular;  Laterality: N/A;  . Esophagogastroduodenoscopy N/A 02/28/2016    Procedure: ESOPHAGOGASTRODUODENOSCOPY (EGD);  Surgeon: [name], MD;  Location: [facility] ENDOSCOPY;  Service: Endoscopy;  Laterality: N/A;   Family History  Problem Relation Age of Onset  . Stroke Maternal Uncle   . Heart attack Neg Hx   . Hypertension Neg Hx   . Stroke Maternal Uncle    Social History  Substance Use Topics  . Smoking status: Former Smoker -- 1.50 packs/day for 57 years    Types: Cigarettes    Quit date: 12/11/2004  . Smokeless tobacco: Never Used  . Alcohol Use: No     Comment: 6 yrs ago     Review of Systems  All other systems reviewed and are negative.     Allergies  Buprenorphine hcl and Morphine and related  Home Medications   Prior to Admission  medications   Medication Sig Start Date End Date Taking? Authorizing Provider  albuterol (PROVENTIL HFA;VENTOLIN HFA) 108 (90 BASE) MCG/ACT inhaler Inhale 2 puffs into the lungs every 4 (four) hours as needed for wheezing. 03/29/13   Cathren Laine, MD  amiodarone (PACERONE) 200 MG tablet Take 200 mg by mouth daily.    Historical Provider, MD  amLODipine (NORVASC) 10 MG tablet Take 10 mg by mouth daily.    Historical Provider, MD  citalopram (CELEXA) 10 MG tablet Take 10 mg by mouth daily.    Historical Provider, MD  dabigatran (PRADAXA) 150 MG CAPS capsule Take 1 capsule (150 mg total) by mouth 2 (two) times daily. 03/08/15   Lewayne Bunting, MD  fluconazole (DIFLUCAN) 100 MG tablet Take 1 tablet (100 mg total) by mouth daily. 02/29/16   Iva Boop, MD  furosemide (LASIX) 20 MG tablet Take 1 tablet (20 mg total) by mouth daily. 02/29/16   Lewayne Bunting, MD  furosemide (LASIX) 40 MG tablet Take 40 mg by mouth daily.  12/30/15   Historical Provider, MD  hydrALAZINE (APRESOLINE) 50 MG tablet Take 0.5 tablets (25 mg total) by mouth 3 (three) times daily. 12/15/14   Lewayne Bunting, MD  ipratropium-albuterol (DUONEB) 0.5-2.5 (3) MG/3ML SOLN Take 3 mLs by nebulization every 4 (four) hours as needed (for shortness of breath). 12/01/15   Leslye Peer, MD  lisinopril (PRINIVIL,ZESTRIL) 40 MG tablet TAKE 1 TABLET EVERY DAY 12/29/14   Lewayne Bunting, MD  metoprolol succinate (TOPROL-XL) 25 MG 24 hr tablet Take 1 tablet (25 mg total) by mouth daily. Please schedule appointment for refills. 02/29/16   Lewayne Bunting, MD  metoprolol tartrate (LOPRESSOR) 25 MG tablet Take 12.5 mg by mouth 2 (two) times daily.    Historical Provider, MD  Multiple Vitamin (MULITIVITAMIN WITH MINERALS) TABS Take 1 tablet by mouth daily.    Historical Provider, MD  niacin (NIASPAN) 500 MG CR tablet Take 1,000 mg by mouth 2 (two) times daily.     Historical Provider, MD  nitroGLYCERIN (NITROSTAT) 0.4 MG SL tablet Place 1 tablet  (0.4 mg total) under the tongue every 5 (five) minutes as needed for chest pain. 02/29/16   Lewayne Bunting, MD  pantoprazole (PROTONIX) 40 MG tablet Take 1 tablet (40 mg total) by mouth 2 (two) times daily. 01/22/16   Leroy Sea, MD  potassium chloride SA (K-DUR,KLOR-CON) 20 MEQ tablet Take 2 tablets three times daily. Please schedule appointment for refills. 02/29/16   Lewayne Bunting, MD  pravastatin (PRAVACHOL) 80 MG tablet Take 1 tablet (80 mg total) by mouth daily.  06/15/15   Lewayne Bunting, MD  tiotropium (SPIRIVA HANDIHALER) 18 MCG inhalation capsule Place 1 capsule (18 mcg total) into inhaler and inhale daily. 10/29/15 10/28/16  Leslye Peer, MD   BP 122/97 mmHg  Pulse 121  Resp 29  SpO2 94% Physical Exam  Constitutional: He is oriented to person, place, and time.  Sitting up in bed with increased WOB but able to speak in complete sentences. No retractions.  HENT:  Right Ear: External ear normal.  Left Ear: External ear normal.  Nose: Nose normal.  Mouth/Throat: Oropharynx is clear and moist. No oropharyngeal exudate.  Eyes: Conjunctivae and EOM are normal. Pupils are equal, round, and reactive to light.  Neck: Normal range of motion. Neck supple.  Cardiovascular: Regular rhythm, normal heart sounds and intact distal pulses.  Tachycardia present.   Pulmonary/Chest:  Soft expiratory wheezes at bilateral lung bases. Some scattered rales in bilateral bases that clear with coughing.  Abdominal: Soft. Bowel sounds are normal. He exhibits no distension. There is no tenderness. There is no rebound and no guarding.  Musculoskeletal:  2-3+ pitting edema in bilateral LE from shins through feet.  Neurological: He is alert and oriented to person, place, and time. No cranial nerve deficit.  Skin: Skin is warm and dry.  Psychiatric: He has a normal mood and affect.  Nursing note and vitals reviewed.  Filed Vitals:   03/12/16 1630 03/12/16 1700 03/12/16 1715 03/12/16 1730  BP:  104/74 113/81 118/84 122/97  Pulse: 100 121 60 121  Resp: SpO2: 97% 95% 94% 94%     ED Course  Procedures (including critical care time) Labs Review Labs Reviewed  BASIC METABOLIC PANEL - Abnormal; Notable for the following:    Sodium 146 (*)    Potassium 3.0 (*)    Glucose, Bld 121 (*)    BUN 22 (*)    Creatinine, Ser 1.54 (*)    Calcium 8.2 (*)    GFR calc non Af Amer 43 (*)    GFR calc Af Amer 50 (*)    All other components within normal limits  CBC WITH DIFFERENTIAL/PLATELET - Abnormal; Notable for the following:    Hemoglobin 12.1 (*)    HCT 38.4 (*)    RDW 17.2 (*)    All other components within normal limits  BRAIN NATRIURETIC PEPTIDE - Abnormal; Notable for the following:    B Natriuretic Peptide 1859.3 (*)    All other components within normal limits  I-STAT TROPOININ, ED    Imaging Review Dg Chest Portable 1 View  03/12/2016  CLINICAL DATA:  Shortness of breath and swelling in lower extremities. EXAM: PORTABLE CHEST 1 VIEW COMPARISON:  01/04/2016 FINDINGS: The heart is enlarged but stable. Stable pacer wires. Mild tortuosity and calcification of the thoracic aorta is noted. There is vascular congestion and mild perihilar and lower lobe interstitial pulmonary edema. No definite pleural effusions. IMPRESSION: Mild CHF. Electronically Signed   By: Rudie Meyer M.D.   On: 03/12/2016 16:37   I have personally reviewed and evaluated these images and lab results as part of my medical decision-making.   EKG Interpretation   Date/Time:  Sunday March 12 2016 15:55:47 EDT Ventricular Rate:  106 PR Interval:    QRS Duration: 164 QT Interval:  442 QTC Calculation: 587 R Axis:   105 Text Interpretation:  Atrial fibrillation Left bundle branch block No  significant change since last tracing Confirmed by FLOYD MD, DANIEL  [id number]) on  03/12/2016 4:03:44 PM      MDM   Final diagnoses:  Acute on chronic congestive heart failure, unspecified congestive heart  failure type (HCC)  Shortness of breath    Pt is an 75 y.o. male with significant PMH who presents to the ED for evaluation of acute onset chest pressure and SOB today. States since last week his legs and feet have been swollen too. He has been doubling his home dose of Lasix with no relief. He does have diffuse wheezing on exam with minimal improvement with breathing treatments and solu-medrol. CXR does show a mild CHF picture. Labs reveal BNP 1859 (baseline 122), K+ of 3.0, Cr 1.54, CBC at baseline. Trop negative. EKG in afib with LBBB stable from prior. Lasix 40mg  IV was given in the ED. Pt does continue to have some increased WOB and mild tachypnea, though he has not been hypoxic. However, given clinical picture, pt's age, and co-morbidities I will call cards for consult.  Spoke with Dr. Sullivan Lone of cards who will bring pt in for obs.     Carlene Coria, PA-C 03/12/16 1829  Melene Plan, DO 03/12/16 [phone]

## 2016-03-13 DIAGNOSIS — Z951 Presence of aortocoronary bypass graft: Secondary | ICD-10-CM | POA: Diagnosis not present

## 2016-03-13 DIAGNOSIS — E785 Hyperlipidemia, unspecified: Secondary | ICD-10-CM | POA: Diagnosis present

## 2016-03-13 DIAGNOSIS — N179 Acute kidney failure, unspecified: Secondary | ICD-10-CM | POA: Diagnosis present

## 2016-03-13 DIAGNOSIS — I2510 Atherosclerotic heart disease of native coronary artery without angina pectoris: Secondary | ICD-10-CM

## 2016-03-13 DIAGNOSIS — J449 Chronic obstructive pulmonary disease, unspecified: Secondary | ICD-10-CM | POA: Diagnosis not present

## 2016-03-13 DIAGNOSIS — I130 Hypertensive heart and chronic kidney disease with heart failure and stage 1 through stage 4 chronic kidney disease, or unspecified chronic kidney disease: Secondary | ICD-10-CM | POA: Diagnosis present

## 2016-03-13 DIAGNOSIS — Z86718 Personal history of other venous thrombosis and embolism: Secondary | ICD-10-CM | POA: Diagnosis not present

## 2016-03-13 DIAGNOSIS — I25810 Atherosclerosis of coronary artery bypass graft(s) without angina pectoris: Secondary | ICD-10-CM | POA: Diagnosis not present

## 2016-03-13 DIAGNOSIS — Z885 Allergy status to narcotic agent status: Secondary | ICD-10-CM | POA: Diagnosis not present

## 2016-03-13 DIAGNOSIS — Z7901 Long term (current) use of anticoagulants: Secondary | ICD-10-CM | POA: Diagnosis not present

## 2016-03-13 DIAGNOSIS — Z8673 Personal history of transient ischemic attack (TIA), and cerebral infarction without residual deficits: Secondary | ICD-10-CM | POA: Diagnosis not present

## 2016-03-13 DIAGNOSIS — E876 Hypokalemia: Secondary | ICD-10-CM | POA: Diagnosis present

## 2016-03-13 DIAGNOSIS — N182 Chronic kidney disease, stage 2 (mild): Secondary | ICD-10-CM | POA: Diagnosis present

## 2016-03-13 DIAGNOSIS — F17210 Nicotine dependence, cigarettes, uncomplicated: Secondary | ICD-10-CM | POA: Diagnosis present

## 2016-03-13 DIAGNOSIS — Z888 Allergy status to other drugs, medicaments and biological substances status: Secondary | ICD-10-CM | POA: Diagnosis not present

## 2016-03-13 DIAGNOSIS — J9601 Acute respiratory failure with hypoxia: Secondary | ICD-10-CM | POA: Diagnosis not present

## 2016-03-13 DIAGNOSIS — I482 Chronic atrial fibrillation: Secondary | ICD-10-CM

## 2016-03-13 DIAGNOSIS — I447 Left bundle-branch block, unspecified: Secondary | ICD-10-CM | POA: Diagnosis present

## 2016-03-13 DIAGNOSIS — R609 Edema, unspecified: Secondary | ICD-10-CM | POA: Diagnosis not present

## 2016-03-13 DIAGNOSIS — L03116 Cellulitis of left lower limb: Secondary | ICD-10-CM | POA: Diagnosis present

## 2016-03-13 DIAGNOSIS — Z9119 Patient's noncompliance with other medical treatment and regimen: Secondary | ICD-10-CM | POA: Diagnosis not present

## 2016-03-13 DIAGNOSIS — Z79899 Other long term (current) drug therapy: Secondary | ICD-10-CM | POA: Diagnosis not present

## 2016-03-13 DIAGNOSIS — I252 Old myocardial infarction: Secondary | ICD-10-CM | POA: Diagnosis not present

## 2016-03-13 DIAGNOSIS — E7800 Pure hypercholesterolemia, unspecified: Secondary | ICD-10-CM | POA: Diagnosis present

## 2016-03-13 DIAGNOSIS — F329 Major depressive disorder, single episode, unspecified: Secondary | ICD-10-CM | POA: Diagnosis present

## 2016-03-13 DIAGNOSIS — I5023 Acute on chronic systolic (congestive) heart failure: Secondary | ICD-10-CM | POA: Diagnosis not present

## 2016-03-13 DIAGNOSIS — I255 Ischemic cardiomyopathy: Secondary | ICD-10-CM | POA: Diagnosis present

## 2016-03-13 DIAGNOSIS — I723 Aneurysm of iliac artery: Secondary | ICD-10-CM | POA: Diagnosis present

## 2016-03-13 DIAGNOSIS — F419 Anxiety disorder, unspecified: Secondary | ICD-10-CM | POA: Diagnosis present

## 2016-03-13 DIAGNOSIS — I472 Ventricular tachycardia: Secondary | ICD-10-CM | POA: Diagnosis not present

## 2016-03-13 DIAGNOSIS — Z823 Family history of stroke: Secondary | ICD-10-CM | POA: Diagnosis not present

## 2016-03-13 DIAGNOSIS — R0602 Shortness of breath: Secondary | ICD-10-CM | POA: Diagnosis present

## 2016-03-13 DIAGNOSIS — Z95810 Presence of automatic (implantable) cardiac defibrillator: Secondary | ICD-10-CM | POA: Diagnosis not present

## 2016-03-13 LAB — CBC
HEMATOCRIT: 35.6 % — AB (ref 39.0–52.0)
HEMOGLOBIN: 11.2 g/dL — AB (ref 13.0–17.0)
MCH: 27.2 pg (ref 26.0–34.0)
MCHC: 31.5 g/dL (ref 30.0–36.0)
MCV: 86.4 fL (ref 78.0–100.0)
Platelets: 163 10*3/uL (ref 150–400)
RBC: 4.12 MIL/uL — AB (ref 4.22–5.81)
RDW: 17.1 % — ABNORMAL HIGH (ref 11.5–15.5)
WBC: 6.2 10*3/uL (ref 4.0–10.5)

## 2016-03-13 LAB — BASIC METABOLIC PANEL
ANION GAP: 9 (ref 5–15)
BUN: 22 mg/dL — AB (ref 6–20)
CHLORIDE: 110 mmol/L (ref 101–111)
CO2: 27 mmol/L (ref 22–32)
Calcium: 8.3 mg/dL — ABNORMAL LOW (ref 8.9–10.3)
Creatinine, Ser: 1.58 mg/dL — ABNORMAL HIGH (ref 0.61–1.24)
GFR calc Af Amer: 48 mL/min — ABNORMAL LOW (ref >60)
GFR calc non Af Amer: 42 mL/min — ABNORMAL LOW (ref >60)
GLUCOSE: 208 mg/dL — AB (ref 65–99)
POTASSIUM: 3.8 mmol/L (ref 3.5–5.1)
Sodium: 146 mmol/L — ABNORMAL HIGH (ref 135–145)

## 2016-03-13 LAB — MAGNESIUM: MAGNESIUM: 1.7 mg/dL (ref 1.7–2.4)

## 2016-03-13 LAB — HEPARIN LEVEL (UNFRACTIONATED)
Heparin Unfractionated: 0.3 IU/mL (ref 0.30–0.70)
Heparin Unfractionated: 0.2 IU/mL — ABNORMAL LOW (ref 0.30–0.70)
Heparin Unfractionated: <0.1 IU/mL — ABNORMAL LOW (ref 0.30–0.70)

## 2016-03-13 MED ORDER — DABIGATRAN ETEXILATE MESYLATE 150 MG PO CAPS
150.0000 mg | ORAL_CAPSULE | Freq: Two times a day (BID) | ORAL | Status: DC
  Administered 2016-03-13 – 2016-03-20 (×14): 150 mg via ORAL
  Filled 2016-03-13 (×15): qty 1

## 2016-03-13 MED ORDER — METOLAZONE 2.5 MG PO TABS
2.5000 mg | ORAL_TABLET | Freq: Once | ORAL | Status: AC
  Administered 2016-03-13: 2.5 mg via ORAL
  Filled 2016-03-13: qty 1

## 2016-03-13 MED ORDER — FUROSEMIDE 10 MG/ML IJ SOLN
80.0000 mg | Freq: Two times a day (BID) | INTRAMUSCULAR | Status: DC
  Administered 2016-03-13 – 2016-03-14 (×2): 80 mg via INTRAVENOUS
  Filled 2016-03-13 (×3): qty 8

## 2016-03-13 NOTE — Progress Notes (Signed)
Patient had 5 beats Vtach.  Patient asymptomatic VSS.  [place], [place] made aware.  Will continue to monitor.

## 2016-03-13 NOTE — Progress Notes (Signed)
ANTICOAGULATION CONSULT NOTE - Follow Up Consult  Pharmacy Consult for Heparin Indication: atrial fibrillation  Allergies  Allergen Reactions  . Buprenorphine Hcl Other (See Comments)    Patient stated that he did not do well on this medication. He felt like climbing the walls  . Morphine And Related Other (See Comments)    Patient stated that he did not do well on this medication. He felt like climbing the walls    Patient Measurements: Height: 6\' 1"  (185.4 cm) Weight: 221 lb 3.2 oz (100.336 kg) (a scale) IBW/kg (Calculated) : 79.9 Heparin Dosing Weight:   Vital Signs: Temp: 98.1 F (36.7 C) (04/03 1135) Temp Source: Oral (04/03 1135) BP: 101/68 mmHg (04/03 1135) Pulse Rate: 104 (04/03 1135)  Labs:  Recent Labs  03/12/16 1630 03/13/16 0340 03/13/16 1151  HGB 12.1* 11.2*  --   HCT 38.4* 35.6*  --   PLT 178 163  --   HEPARINUNFRC  --  0.30 0.20*  CREATININE 1.54* 1.58*  --     Estimated Creatinine Clearance: 51.9 mL/min (by C-G formula based on Cr of 1.58).   Medications:  Scheduled:  . amiodarone  200 mg Oral Daily  . amLODipine  10 mg Oral Daily  . Chlorhexidine Gluconate Cloth  6 each Topical Q0600  . citalopram  10 mg Oral Daily  . furosemide  40 mg Intravenous BID  . hydrALAZINE  50 mg Oral 3 times per day  . metoprolol succinate  25 mg Oral Daily  . multivitamin with minerals  1 tablet Oral Daily  . mupirocin ointment  1 application Nasal BID  . niacin  1,000 mg Oral BID  . pantoprazole  40 mg Oral BID  . potassium chloride SA  40 mEq Oral TID  . pravastatin  80 mg Oral Daily  . sodium chloride flush  3 mL Intravenous Q12H  . tiotropium  18 mcg Inhalation Daily    Assessment: 75yo male with AFib, on heparin bridge while Pradaxa is on hold.  Repeat HL 0.2 on 1500 units/hr, drawn at ~noon.  Per d/w RN, the heparin was off from 9-9:30 this AM as the IV had come out.  No bleeding noted.  Cr remains up this AM Hg 11.2 and pltc wnl  Goal of Therapy:   Heparin level 0.3-0.7 units/ml Monitor platelets by anticoagulation protocol: Yes   Plan:  Increase Heparin to 1600 units/hr Repeat HL in 8hr Daily HL, CBC Watch for s/s of bleeding F/U plans to resume Pradaxa  Marisue Humble, PharmD Clinical Pharmacist [facility]

## 2016-03-13 NOTE — Progress Notes (Signed)
TELEMETRY: Reviewed telemetry pt in Atrial fibrillation rate 95-105 : Filed Vitals:   03/13/16 0944 03/13/16 1018 03/13/16 1115 03/13/16 1135  BP: 128/77 112/86  101/68  Pulse:    104  Temp: 98.2 F (36.8 C)   98.1 F (36.7 C)  TempSrc: Oral   Oral  Resp: 16   16  Height:      Weight:      SpO2:   96% 97%    Intake/Output Summary (Last 24 hours) at 03/13/16 1653 Last data filed at 03/13/16 1437  Gross per 24 hour  Intake 896.75 ml  Output   1975 ml  Net -1078.25 ml   Filed Weights   03/12/16 1910 03/13/16 0632  Weight: 100.517 kg (221 lb 9.6 oz) 100.336 kg (221 lb 3.2 oz)    Subjective Still complains of chest tightness and a dry cough. No chest pain. No dizziness. No dysphagia.   [name] amiodarone  200 mg Oral Daily  . amLODipine  10 mg Oral Daily  . Chlorhexidine Gluconate Cloth  6 each Topical Q0600  . citalopram  10 mg Oral Daily  . furosemide  40 mg Intravenous BID  . hydrALAZINE  50 mg Oral 3 times per day  . metoprolol succinate  25 mg Oral Daily  . multivitamin with minerals  1 tablet Oral Daily  . mupirocin ointment  1 application Nasal BID  . niacin  1,000 mg Oral BID  . pantoprazole  40 mg Oral BID  . potassium chloride SA  40 mEq Oral TID  . pravastatin  80 mg Oral Daily  . sodium chloride flush  3 mL Intravenous Q12H  . tiotropium  18 mcg Inhalation Daily   . heparin 1,600 Units/hr (03/13/16 1339)    LABS: Basic Metabolic Panel:  Recent Labs  16/10/96 1630 03/12/16 2108 03/13/16 0340  NA 146*  --  146*  K 3.0* 3.1* 3.8  CL 107  --  110  CO2 27  --  27  GLUCOSE 121*  --  208*  BUN 22*  --  22*  CREATININE 1.54*  --  1.58*  CALCIUM 8.2*  --  8.3*  MG  --   --  1.7   Liver Function Tests: No results for input(s): AST, ALT, ALKPHOS, BILITOT, PROT, ALBUMIN in the last 72 hours. No results for input(s): LIPASE, AMYLASE in the last 72 hours. CBC:  Recent Labs  03/12/16 1630 03/13/16 0340  WBC 7.7 6.2  NEUTROABS 5.3  --   HGB 12.1*  11.2*  HCT 38.4* 35.6*  MCV 86.5 86.4  PLT 178 163   Cardiac Enzymes: No results for input(s): CKTOTAL, CKMB, CKMBINDEX, TROPONINI in the last 72 hours. BNP: No results for input(s): PROBNP in the last 72 hours. D-Dimer: No results for input(s): DDIMER in the last 72 hours. Hemoglobin A1C: No results for input(s): HGBA1C in the last 72 hours. Fasting Lipid Panel: No results for input(s): CHOL, HDL, LDLCALC, TRIG, CHOLHDL, LDLDIRECT in the last 72 hours. Thyroid Function Tests: No results for input(s): TSH, T4TOTAL, T3FREE, THYROIDAB in the last 72 hours.  Invalid input(s): FREET3   Radiology/Studies:  Dg Chest Portable 1 View  03/12/2016  CLINICAL DATA:  Shortness of breath and swelling in lower extremities. EXAM: PORTABLE CHEST 1 VIEW COMPARISON:  01/04/2016 FINDINGS: The heart is enlarged but stable. Stable pacer wires. Mild tortuosity and calcification of the thoracic aorta is noted. There is vascular congestion and mild perihilar and lower lobe interstitial pulmonary edema. No  definite pleural effusions. IMPRESSION: Mild CHF. Electronically Signed   By: Rudie Meyer M.D.   On: 03/12/2016 16:37   Ecg shows afib with LBBB. I have personally reviewed and interpreted this study.   PHYSICAL EXAM General: Well developed, well nourished, in no acute distress. Head: Normocephalic, atraumatic, sclera non-icteric, oropharynx is clear Neck: Negative for carotid bruits. JVD  Elevated to 10 cm. No adenopathy Lungs: Bilateral rales 1/3 up. Heart: iRRR S1 S2 without murmurs, rubs, or gallops.  Abdomen: Soft, non-tender, mildly distended with normoactive bowel sounds.  No rebound/guarding. No obvious abdominal masses. Extremities: 3+ pretibial edema.  Feet are warm. Neuro: Alert and oriented X 3. Moves all extremities spontaneously. Psych:  Responds to questions appropriately with a normal affect.  ASSESSMENT AND PLAN: 1. Acute on chronic systolic CHF. EF 25% by Myoview in 12/16. Echo in  November 2016 showed EF 40%. Weight is up 16 lbs compared to baseline. Marked edema and rales on exam. Recent reduction in lasix dose. Did not respond to increased PO dose. Will increase IV lasix to 80 mg bid. Give metolazone 2.5 mg po x 1. Replete potassium. Tentatively scheduled for TEE on Wednesday for evaluation for a Watchman device. 2. Atrial fibrillation. Chronic and permanent. On Pradaxa for anticoagulation. Will DC IV heparin. Rate under fair control but ideally would be slower to improve CRT therapy. Continue beta blocker and amiodarone. 3. S/p CRTD  4. CAD w/p CABG. No angina. Troponin normal. 5. Acute on chronic renal failure. Monitor renal function closely.ACEi on hold.   6. Hypokalemia. Replete. 7. HTN 8. LBBB 9. S/p recent esophageal dilation with candida esophagitis. Dr. Russella Dar. 10. History of GI bleed secondary to [employer] tear. Being considered for Watchman device. Continue Pradaxa for now.   Present on Admission:  . HYPERTENSION, BENIGN . HYPERCHOLESTEROLEMIA, PURE . CORONARY ATHEROSCLEROSIS NATIVE CORONARY ARTERY . Atrial fibrillation (HCC) . COPD (chronic obstructive pulmonary disease) (HCC) . Depression  Signed, Ruthel Martine [place], MDFACC 03/13/2016 4:53 PM

## 2016-03-13 NOTE — Progress Notes (Signed)
ANTICOAGULATION CONSULT NOTE - Follow Up Consult  Pharmacy Consult for Heparin  Indication: atrial fibrillation  Allergies  Allergen Reactions  . Buprenorphine Hcl Other (See Comments)    Patient stated that he did not do well on this medication. He felt like climbing the walls  . Morphine And Related Other (See Comments)    Patient stated that he did not do well on this medication. He felt like climbing the walls    Patient Measurements: Height: 6\' 1"  (185.4 cm) Weight: 221 lb 9.6 oz (100.517 kg) (Scale A) IBW/kg (Calculated) : 79.9  Vital Signs: Temp: 97.7 F (36.5 C) (04/03 0219) Temp Source: Oral (04/03 0219) BP: 136/70 mmHg (04/03 0219) Pulse Rate: 98 (04/03 0219)  Labs:  Recent Labs  03/12/16 1630 03/13/16 0340  HGB 12.1* 11.2*  HCT 38.4* 35.6*  PLT 178 163  HEPARINUNFRC  --  0.30  CREATININE 1.54*  --     Estimated Creatinine Clearance: 53.2 mL/min (by C-G formula based on Cr of 1.54).  Assessment: Heparin for afib while pradaxa on hold, initial heparin level is on low end of therapeutic range  Goal of Therapy:  Heparin level 0.3-0.7 units/ml Monitor platelets by anticoagulation protocol: Yes   Plan:  -Continue heparin at 1500 units/hr -1200 HL  Abran Duke 03/13/2016,4:37 AM

## 2016-03-14 ENCOUNTER — Telehealth: Payer: Self-pay | Admitting: Cardiology

## 2016-03-14 ENCOUNTER — Ambulatory Visit: Payer: Medicare Other | Admitting: Gastroenterology

## 2016-03-14 DIAGNOSIS — Z95810 Presence of automatic (implantable) cardiac defibrillator: Secondary | ICD-10-CM

## 2016-03-14 DIAGNOSIS — I255 Ischemic cardiomyopathy: Secondary | ICD-10-CM

## 2016-03-14 DIAGNOSIS — J9601 Acute respiratory failure with hypoxia: Secondary | ICD-10-CM

## 2016-03-14 LAB — BASIC METABOLIC PANEL
Anion gap: 14 (ref 5–15)
BUN: 30 mg/dL — AB (ref 6–20)
CHLORIDE: 104 mmol/L (ref 101–111)
CO2: 24 mmol/L (ref 22–32)
CREATININE: 1.63 mg/dL — AB (ref 0.61–1.24)
Calcium: 8.5 mg/dL — ABNORMAL LOW (ref 8.9–10.3)
GFR, EST AFRICAN AMERICAN: 47 mL/min — AB (ref >60)
GFR, EST NON AFRICAN AMERICAN: 40 mL/min — AB (ref >60)
Glucose, Bld: 105 mg/dL — ABNORMAL HIGH (ref 65–99)
Potassium: 4 mmol/L (ref 3.5–5.1)
SODIUM: 142 mmol/L (ref 135–145)

## 2016-03-14 IMAGING — CR DG CHEST 2V
4 series · 4 of 4 positions shown · non-contrast
Comparison: 12/16/2014

CLINICAL DATA: Patient states that since last night he has been
having right sided numbness with pains and dizziness, unable to
stand for exam due to weakness

EXAM:
CHEST  2 VIEW

[w chest lat (1 of 2)]
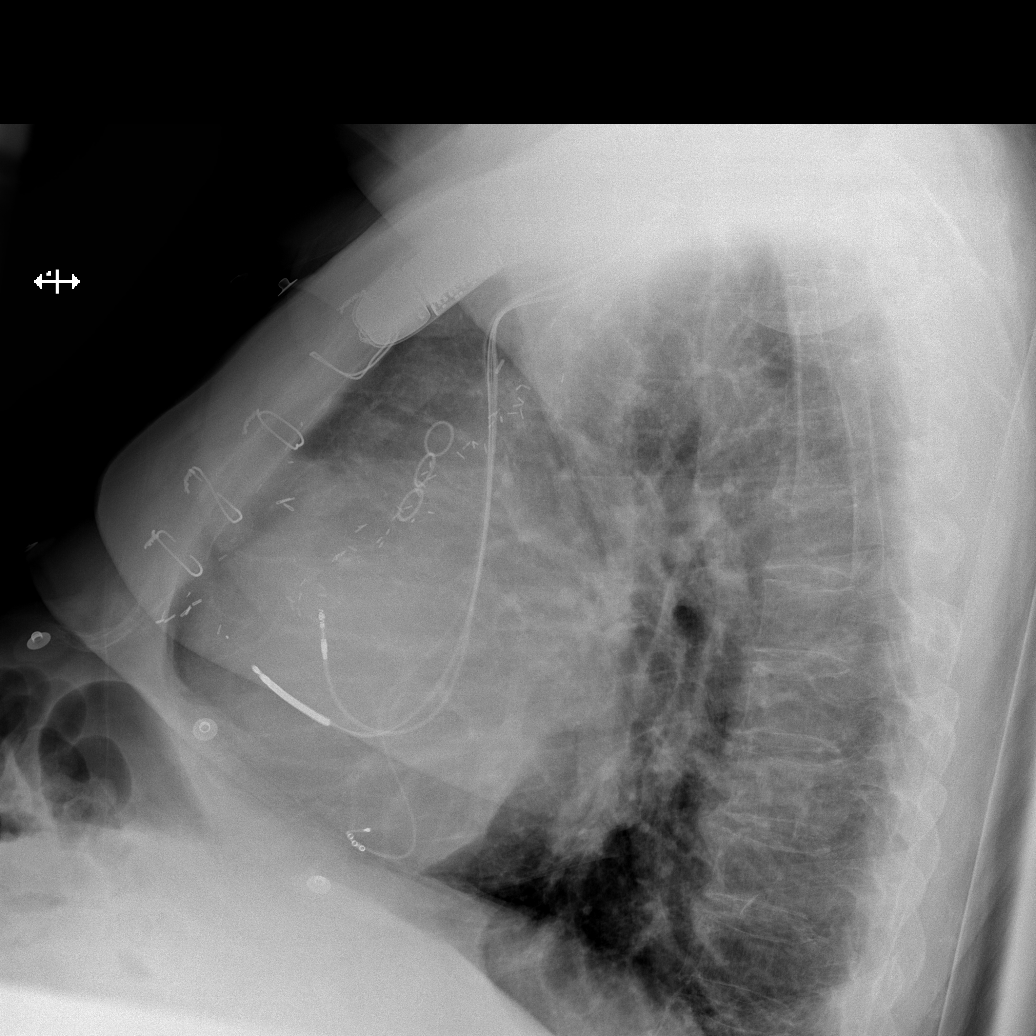

[w chest lat (2 of 2)]
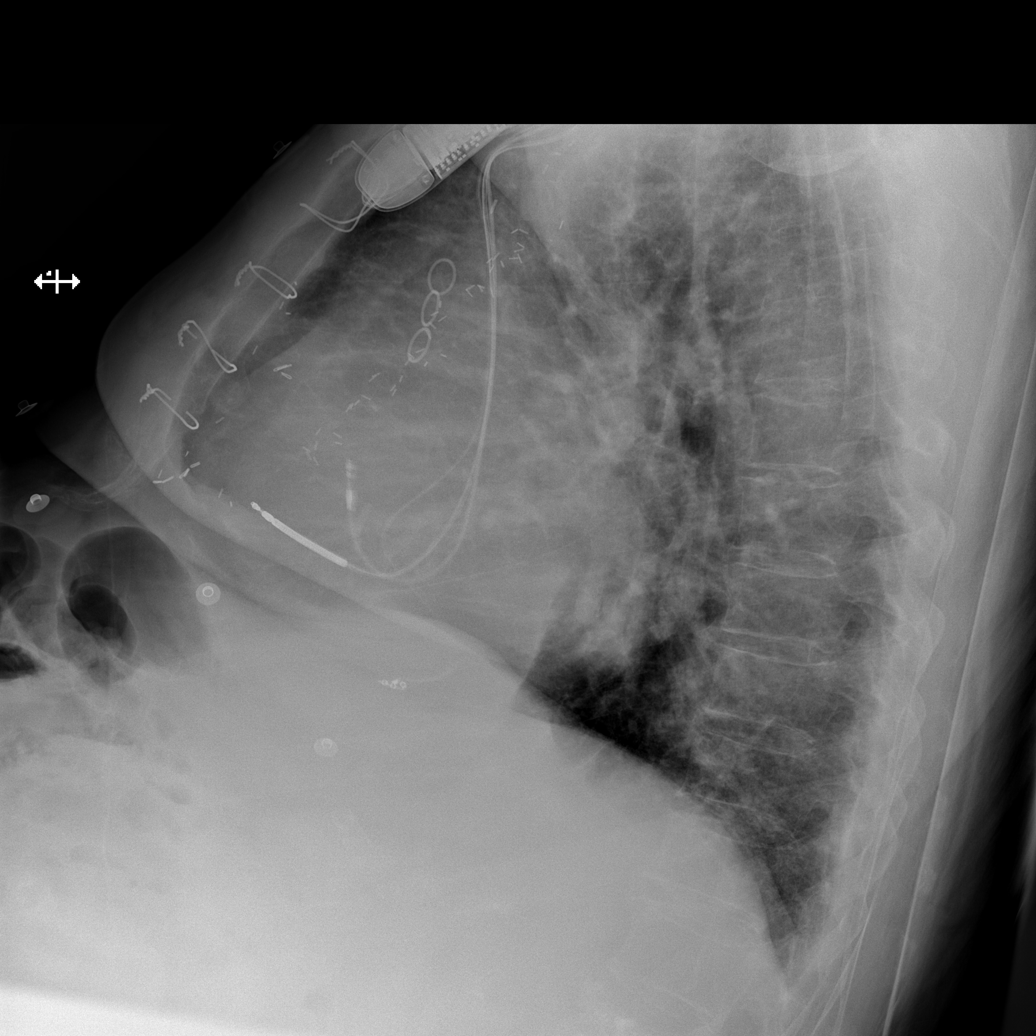

[x chest ap (1 of 2)]
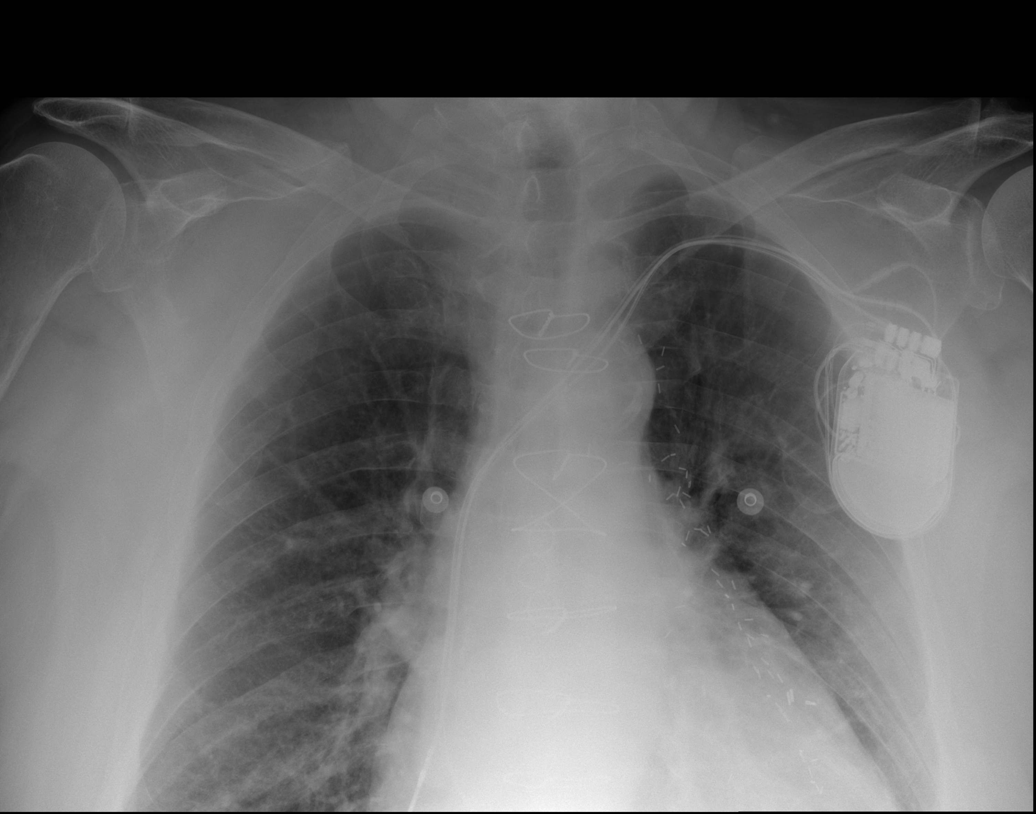

[x chest ap (2 of 2)]
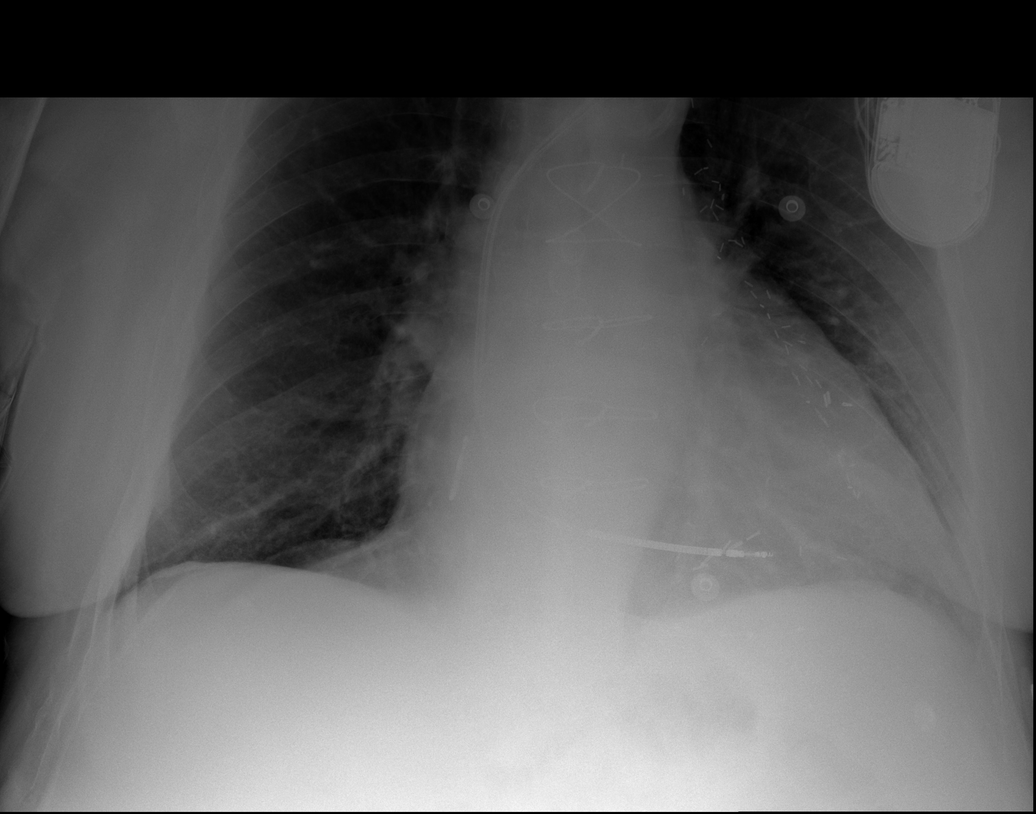

[4 of 4 positions shown; findings below may reference images not displayed]

FINDINGS: Changes from cardiac surgery are stable. Cardiac silhouette is
mildly enlarged. No mediastinal or hilar masses or convincing
adenopathy.

Lungs are mildly hyperexpanded. No lung consolidation or edema. No
pleural effusion or pneumothorax.

Left anterior chest wall biventricular cardioverter-defibrillator
appear stable

Bony thorax is demineralized but grossly intact.
IMPRESSION: No acute cardiopulmonary disease.

## 2016-03-14 MED ORDER — METOPROLOL TARTRATE 25 MG PO TABS
25.0000 mg | ORAL_TABLET | Freq: Two times a day (BID) | ORAL | Status: DC
  Administered 2016-03-14 – 2016-03-20 (×13): 25 mg via ORAL
  Filled 2016-03-14 (×13): qty 1

## 2016-03-14 MED ORDER — METOLAZONE 2.5 MG PO TABS
2.5000 mg | ORAL_TABLET | Freq: Once | ORAL | Status: AC
  Administered 2016-03-14: 2.5 mg via ORAL
  Filled 2016-03-14: qty 1

## 2016-03-14 MED ORDER — FUROSEMIDE 10 MG/ML IJ SOLN: 0.1000 mg/kg/h | INTRAVENOUS | Status: DC

## 2016-03-14 MED ORDER — FUROSEMIDE 10 MG/ML IJ SOLN
8.0000 mg/h | INTRAVENOUS | Status: DC
  Administered 2016-03-14 – 2016-03-15 (×2): 8 mg/h via INTRAVENOUS
  Filled 2016-03-14 (×3): qty 25

## 2016-03-14 NOTE — Telephone Encounter (Signed)
She wants you to call her,she says pt is in the hospital.

## 2016-03-14 NOTE — Telephone Encounter (Signed)
Spoke with pt dtr, the dtr found out the pt has not taken any of his medications for 4 to 8 months. He is currently admitted and is having some confusion and behavior changes. Reassurance given to the dtr that we are involved in his care

## 2016-03-14 NOTE — Progress Notes (Signed)
Subjective:  Still complaining of SOB and edema- no real change  Objective:  Vital Signs in the last 24 hours: Temp:  [97.5 F (36.4 C)-97.7 F (36.5 C)] 97.5 F (36.4 C) (04/04 0837) Pulse Rate:  [85-91] 85 (04/04 0837) Resp:  [18] 18 (04/04 0837) BP: (107-112)/(65-72) 107/65 mmHg (04/04 0837) SpO2:  [95 %-96 %] 96 % (04/04 0837) Weight:  [219 lb 9.6 oz (99.61 kg)] 219 lb 9.6 oz (99.61 kg) (04/04 0603)  Intake/Output from previous day:  Intake/Output Summary (Last 24 hours) at 03/14/16 1151 Last data filed at 03/14/16 0933  Gross per 24 hour  Intake 979.75 ml  Output   1100 ml  Net -120.25 ml    Physical Exam: General appearance: alert, cooperative and no distress Lungs: decreased breath sounds, scattered wheezes Heart: regular rate and rhythm and decreased heart sounds Extremities: 2+ bilat LE edema Skin: pale, cool, dry Neurologic: Grossly normal   Rate: 86  Rhythm: atrial fibrillation and POD, LBBB  Lab Results:  Recent Labs  03/12/16 1630 03/13/16 0340  WBC 7.7 6.2  HGB 12.1* 11.2*  PLT 178 163    Recent Labs  03/13/16 0340 03/14/16 0340  NA 146* 142  K 3.8 4.0  CL 110 104  CO2 27 24  GLUCOSE 208* 105*  BUN 22* 30*  CREATININE 1.58* 1.63*   No results for input(s): TROPONINI in the last 72 hours.  Invalid input(s): CK, MB No results for input(s): INR in the last 72 hours.  Scheduled Meds: . amiodarone  200 mg Oral Daily  . amLODipine  10 mg Oral Daily  . Chlorhexidine Gluconate Cloth  6 each Topical Q0600  . citalopram  10 mg Oral Daily  . dabigatran  150 mg Oral Q12H  . furosemide  80 mg Intravenous BID  . hydrALAZINE  50 mg Oral 3 times per day  . metoprolol succinate  25 mg Oral Daily  . multivitamin with minerals  1 tablet Oral Daily  . mupirocin ointment  1 application Nasal BID  . niacin  1,000 mg Oral BID  . pantoprazole  40 mg Oral BID  . potassium chloride SA  40 mEq Oral TID  . pravastatin  80 mg Oral Daily  . sodium  chloride flush  3 mL Intravenous Q12H  . tiotropium  18 mcg Inhalation Daily   Continuous Infusions:  PRN Meds:.sodium chloride, acetaminophen, albuterol, ipratropium-albuterol, ondansetron (ZOFRAN) IV, sodium chloride flush   Imaging: Imaging results have been reviewed  Cardiac Studies: Myoview Dec 2016 (pre op)  Nuclear stress EF: 25%.  The left ventricular ejection fraction is severely decreased (<30%).  Defect 1: There is a medium defect of severe severity present in the basal inferior, mid inferior and apex location.  Findings consistent with prior myocardial infarction.  This is an intermediate risk study.  1. Intermediate risk study secondary to severe LV dysfunction 2. Inferior scar w/o ischemia consistent with ISCM  Assessment/Plan:  75 yo M pt of Dr. [name] w a h/o CAD s/p CABG 1998-cath in 2007 and Aug 2013- medical Rx, ICM EF 40-45% Jan 2016 but EF 25% per pre op Lexiscan in Dec 2016, (prior to AO Fem BPG Feb 2017). He is s/p STJ CRTD (2013), he has pAfib (last EKG in NSR 01/03/16) on Amiodarone, LBBB, h/o CVA s/p left CEA 2005, HTN, HLD, CKD stage 2,  & COPD presents with acute on chronic CHF. He was admitted 01/21/16 to 01/22/16 with hematemesis attributed to a mallory-weiss tear.  He had visited Dr. Ludwig Clarks clinic 02/09/16 when the possibility of a Watchman was addressed. He was set up for a simultaneous EGD/TEE on 02/28/16. 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 Since admission he has diuresed 2L,and 2lbs. Metalozone 2.5 given yesterday.     Principal Problem:   Acute respiratory failure with hypoxia (HCC) Active Problems:   Cardiomyopathy, ischemic-EF 25%    Acute on chronic systolic (congestive) heart failure (HCC)   CAD- CABG '98- cath 2007 and Aug 2013- Med Rx   Permanent atrial fibrillation [facility])   Long term current use of anticoagulant-Pradaxa   COPD (chronic obstructive pulmonary disease) (HCC)   Chronic  systolic heart failure- previous EF 40-45% Jan 2016   Automatic implantable cardioverter-defibrillator in situ   CKD (chronic kidney disease), stage II   GI bleed-Feb 2017 (being cnsidered for Watchman device)   AKI (acute kidney injury) (HCC)   History of stroke-2005   HLD (hyperlipidemia)   AAA - s/p AO Fem BPG Feb 2017   PLAN: Reviewed with Dr [place]. Increase beta blocker, leave on Amiodarone now for rate control. Try IV Lasix drip and another dose of metolazone today. Not sure he will be able to have TEE tomorrow (for Watchman evaluation).   Corine Shelter PA-C 03/14/2016, 11:51 AM [phone]  Patient seen and examined and history reviewed. Agree with above findings and plan. Patient still complains of SOB and chest tightness. I/O negative but still significantly volume overloaded. Very edematous. Afib rate in 80s. Will increase metoprolol to see if we can achieve more CRT pacing. Will switch lasix to continuous IV drip. Will cancel planned TEE for tomorrow since he is not medically stable.  I spoke to patient's daughter Stanton Kidney today she reports that he has not taken any of his medication for at least 4 months. He confirms this when confronted and states he just couldn't afford his medication. Will ask Case management to see for assistance. Will need to work with family to insure he is able to get and take his meds.   Jenice Leiner [place], MDFACC 03/14/2016 12:12 PM

## 2016-03-14 NOTE — Progress Notes (Signed)
CM CONSULT  Received referral that patient is having a hard time affording his medication. CM talked to patient, he is very proud and initially had a hard time talking about this. His spouse died 27-Aug-2016and he stated that his fiances were decreased by $1,400. He has Media planner with [employer] with prescription drug coverage but the co pay for Pradaxa is very expensive $100.00 per month. Patient gave me permission to talk to his daughter Stanton Kidney.  TCT Stanton Kidney [phone]); CM informed her to contact the Pradaxia medication assistance program to see if he will qualify for any assistance through their drug company, telephone # given. CM will continue to follow for DCP; B Shelba Flake [phone]

## 2016-03-15 ENCOUNTER — Inpatient Hospital Stay (HOSPITAL_COMMUNITY): Payer: Medicare Other

## 2016-03-15 ENCOUNTER — Encounter (HOSPITAL_COMMUNITY)
Admission: EM | Disposition: A | Discharge status: Home / Self Care | Payer: Self-pay | Source: Home / Self Care | Attending: Cardiology

## 2016-03-15 ENCOUNTER — Ambulatory Visit (HOSPITAL_COMMUNITY): Admission: RE | Admit: 2016-03-15 | Payer: Medicare Other | Source: Ambulatory Visit | Admitting: Cardiovascular Disease

## 2016-03-15 DIAGNOSIS — R609 Edema, unspecified: Secondary | ICD-10-CM

## 2016-03-15 DIAGNOSIS — I472 Ventricular tachycardia: Secondary | ICD-10-CM

## 2016-03-15 DIAGNOSIS — I4729 Other ventricular tachycardia: Secondary | ICD-10-CM

## 2016-03-15 DIAGNOSIS — Z9119 Patient's noncompliance with other medical treatment and regimen: Secondary | ICD-10-CM

## 2016-03-15 DIAGNOSIS — N182 Chronic kidney disease, stage 2 (mild): Secondary | ICD-10-CM

## 2016-03-15 DIAGNOSIS — Z7901 Long term (current) use of anticoagulants: Secondary | ICD-10-CM

## 2016-03-15 DIAGNOSIS — Z91199 Patient's noncompliance with other medical treatment and regimen due to unspecified reason: Secondary | ICD-10-CM

## 2016-03-15 LAB — MAGNESIUM: MAGNESIUM: 1.7 mg/dL (ref 1.7–2.4)

## 2016-03-15 LAB — BASIC METABOLIC PANEL
ANION GAP: 15 (ref 5–15)
BUN: 28 mg/dL — ABNORMAL HIGH (ref 6–20)
CALCIUM: 9.1 mg/dL (ref 8.9–10.3)
CO2: 30 mmol/L (ref 22–32)
Chloride: 102 mmol/L (ref 101–111)
Creatinine, Ser: 1.81 mg/dL — ABNORMAL HIGH (ref 0.61–1.24)
GFR, EST AFRICAN AMERICAN: 41 mL/min — AB (ref >60)
GFR, EST NON AFRICAN AMERICAN: 35 mL/min — AB (ref >60)
GLUCOSE: 128 mg/dL — AB (ref 65–99)
Potassium: 3.5 mmol/L (ref 3.5–5.1)
SODIUM: 147 mmol/L — AB (ref 135–145)

## 2016-03-15 LAB — POTASSIUM
POTASSIUM: 3.7 mmol/L (ref 3.5–5.1)
Potassium: 2.9 mmol/L — ABNORMAL LOW (ref 3.5–5.1)

## 2016-03-15 SURGERY — ECHOCARDIOGRAM, TRANSESOPHAGEAL
Anesthesia: Moderate Sedation

## 2016-03-15 MED ORDER — CEPHALEXIN 500 MG PO CAPS
500.0000 mg | ORAL_CAPSULE | Freq: Three times a day (TID) | ORAL | Status: DC
  Administered 2016-03-15 – 2016-03-20 (×15): 500 mg via ORAL
  Filled 2016-03-15 (×14): qty 1

## 2016-03-15 MED ORDER — POTASSIUM CHLORIDE CRYS ER 20 MEQ PO TBCR
40.0000 meq | EXTENDED_RELEASE_TABLET | Freq: Once | ORAL | Status: AC
  Administered 2016-03-15: 40 meq via ORAL
  Filled 2016-03-15: qty 2

## 2016-03-15 MED ORDER — MAGNESIUM OXIDE 400 (241.3 MG) MG PO TABS
400.0000 mg | ORAL_TABLET | Freq: Two times a day (BID) | ORAL | Status: AC
  Administered 2016-03-15 – 2016-03-17 (×6): 400 mg via ORAL
  Filled 2016-03-15 (×6): qty 1

## 2016-03-15 NOTE — Progress Notes (Signed)
Subjective:  Still complaining of SOB and edema, now Lt leg > Rt  Objective:  Vital Signs in the last 24 hours: Temp:  [97.4 F (36.3 C)-97.8 F (36.6 C)] 97.6 F (36.4 C) (04/05 0531) Pulse Rate:  [71-87] 81 (04/05 0531) Resp:  [18] 18 (04/05 0531) BP: (97-137)/(57-79) 137/79 mmHg (04/05 0531) SpO2:  [97 %-98 %] 97 % (04/05 0655) Weight:  [208 lb 11.2 oz (94.666 kg)] 208 lb 11.2 oz (94.666 kg) (04/05 0531)  Intake/Output from previous day:  Intake/Output Summary (Last 24 hours) at 03/15/16 1022 Last data filed at 03/15/16 0842  Gross per 24 hour  Intake 1389.6 ml  Output   3825 ml  Net -2435.4 ml    Physical Exam: General appearance: alert, cooperative and no distress Lungs: decreased breath sounds, scattered wheezes Heart: regular rate and rhythm and decreased heart sounds Extremities: 2+ Lt LE edema with a new area of erythema, warm to touch. Rt LE now with trace edema Skin: pale, cool, dry Neurologic: Grossly normal   Rate: 86  Rhythm: atrial fibrillation and POD, LBBB, 15 beat NSVT run  Lab Results:  Recent Labs  03/12/16 1630 03/13/16 0340  WBC 7.7 6.2  HGB 12.1* 11.2*  PLT 178 163    Recent Labs  03/14/16 0340 03/15/16 0117 03/15/16 0225  NA 142  --  147*  K 4.0 3.7 3.5  CL 104  --  102  CO2 24  --  30  GLUCOSE 105*  --  128*  BUN 30*  --  28*  CREATININE 1.63*  --  1.81*   No results for input(s): TROPONINI in the last 72 hours.  Invalid input(s): CK, MB No results for input(s): INR in the last 72 hours.  Scheduled Meds: . amiodarone  200 mg Oral Daily  . amLODipine  10 mg Oral Daily  . Chlorhexidine Gluconate Cloth  6 each Topical Q0600  . citalopram  10 mg Oral Daily  . dabigatran  150 mg Oral Q12H  . hydrALAZINE  50 mg Oral 3 times per day  . metoprolol tartrate  25 mg Oral BID  . multivitamin with minerals  1 tablet Oral Daily  . mupirocin ointment  1 application Nasal BID  . niacin  1,000 mg Oral BID  . pantoprazole  40 mg  Oral BID  . potassium chloride SA  40 mEq Oral TID  . pravastatin  80 mg Oral Daily  . sodium chloride flush  3 mL Intravenous Q12H  . tiotropium  18 mcg Inhalation Daily   Continuous Infusions: . furosemide (LASIX) infusion 8 mg/hr (03/14/16 1320)   PRN Meds:.sodium chloride, acetaminophen, albuterol, ipratropium-albuterol, ondansetron (ZOFRAN) IV, sodium chloride flush   Imaging: Imaging results have been reviewed  Cardiac Studies: Myoview Dec 2016 (pre op)  Nuclear stress EF: 25%.  The left ventricular ejection fraction is severely decreased (<30%).  Defect 1: There is a medium defect of severe severity present in the basal inferior, mid inferior and apex location.  Findings consistent with prior myocardial infarction.  This is an intermediate risk study.  1. Intermediate risk study secondary to severe LV dysfunction 2. Inferior scar w/o ischemia consistent with ISCM  Assessment/Plan:  75 yo M pt of Dr. [name] w a h/o CAD s/p CABG 1998-cath in 2007 and Aug 2013- medical Rx, ICM EF 40-45% Jan 2016 but EF 25% per pre op Lexiscan in Dec 2016, (prior to AO Fem BPG Feb 2017). He is s/p STJ CRTD (2013), he  has pAfib (last EKG in NSR 01/03/16) on Amiodarone, LBBB, h/o CVA s/p left CEA 2005, HTN, HLD, CKD stage 2,  & COPD presents with acute on chronic CHF. He was admitted 01/21/16 to 01/22/16 with hematemesis attributed to a mallory-weiss tear. He had visited Dr. Ludwig Clarks clinic 02/09/16 when the possibility of a Watchman was addressed. He was set up for a simultaneous EGD/TEE on 02/28/16. 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 Since admission he has diuresed 2L,and 2lbs. Metalozone 2.5 given yesterday.     Principal Problem:   Acute respiratory failure with hypoxia (HCC) Active Problems:   Cardiomyopathy, ischemic-EF 25%    Acute on chronic systolic (congestive) heart failure (HCC)   CAD- CABG '98- cath 2007 and Aug 2013- Med  Rx   Permanent atrial fibrillation [facility])   Long term current use of anticoagulant-Pradaxa   COPD (chronic obstructive pulmonary disease) (HCC)   Chronic systolic heart failure- previous EF 40-45% Jan 2016   Automatic implantable cardioverter-defibrillator in situ   CKD (chronic kidney disease), stage II   GI bleed-Feb 2017 (being cnsidered for Watchman device)   AKI (acute kidney injury) (HCC)   NSVT (nonsustained ventricular tachycardia) (HCC)   Non compliance with medical treatment   History of stroke-2005   HLD (hyperlipidemia)   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 I/O negative 3.9L, wgt down 13 lbs. SCr has bumped to 1.8. We can probably change to BID dosing- will review with MD. Check BNP in am.    We found out yesterday he hasn't taken any of his medications for several months. Care manager consult ordered to help with home meds.  NSVT on telemetry-  K+ ordered, will check K+ level this PM. Mg++ 1.7- start po Mg++. -continue Lopressor and Amiodarone  Cellulitis and possible DVT LLE. He is on Pradaxa but apparently wasn't taking it prior to adm. Check venous doppler, consider course of Keflex if doppler is negative.   Corine Shelter PA-C 03/15/2016 10:30 AM   Patient seen and examined and history reviewed. Agree with above findings and plan. Patient states he got up to bathroom and felt a "pop"in his left calf. Leg then became more swollen, painful, and red. Overall responded better to diuresis yesterday. I/O negative 2127 (overall -3.9 liters). Weight down 11 lbs. Swelling in right leg much better but left leg has 3+ edema with red discoloration of the pretibial area. Concerning for early cellulitis. Will check LE venous doppler. If no DVT will start Keflex. Noted to have some NSVT on monitor. On amiodarone. Afib rate controlled. Will replete K+ and Mg++.  Case management to assist with medications. Patient has not been taking meds for several  months because he couldn't afford them. Will see what kind of patient assistance is available. He is certainly not a candidate for a Watchman device until he can demonstrate compliance with medication. I would favor continuing IV lasix infusion for now.   Peter [place], MDFACC 03/15/2016 12:52 PM

## 2016-03-15 NOTE — Progress Notes (Signed)
Pt has history of fall 31mos ago stated by the pt. Placed bed alarm on,but pt is refusing the bed alarm on. Will continue to monitor pt

## 2016-03-15 NOTE — Progress Notes (Signed)
VASCULAR LAB PRELIMINARY  PRELIMINARY  PRELIMINARY  PRELIMINARY  Left lower extremity venous duplex completed.    Preliminary report:  Left:  No evidence of DVT, superficial thrombosis, or Baker's cyst.   Jenetta Loges, RVT, RDMS 03/15/2016, 4:30 PM

## 2016-03-15 NOTE — Progress Notes (Signed)
MD on call informed that CMT called stating that pt had 15 beat vs Wide QRS complex Pt stated that he felt funny but no chest pain b/p 97/57 hr 78. New Orders received. Will continue to monitor

## 2016-03-15 NOTE — Progress Notes (Signed)
Pt had a small area on the left side of leg just above his ankle he stated that it is painful and he felft like something pop on the leg.The redness has expanded. He refused Tylenol for pain. Will inform RN to ask MD to assess. Ilean Skill LPN

## 2016-03-16 LAB — BASIC METABOLIC PANEL
Anion gap: 14 (ref 5–15)
BUN: 25 mg/dL — ABNORMAL HIGH (ref 6–20)
CO2: 34 mmol/L — ABNORMAL HIGH (ref 22–32)
Calcium: 8.7 mg/dL — ABNORMAL LOW (ref 8.9–10.3)
Chloride: 95 mmol/L — ABNORMAL LOW (ref 101–111)
Creatinine, Ser: 1.63 mg/dL — ABNORMAL HIGH (ref 0.61–1.24)
GFR calc Af Amer: 47 mL/min — ABNORMAL LOW (ref >60)
GFR calc non Af Amer: 40 mL/min — ABNORMAL LOW (ref >60)
Glucose, Bld: 106 mg/dL — ABNORMAL HIGH (ref 65–99)
Potassium: 3.2 mmol/L — ABNORMAL LOW (ref 3.5–5.1)
Sodium: 143 mmol/L (ref 135–145)

## 2016-03-16 LAB — BRAIN NATRIURETIC PEPTIDE: B Natriuretic Peptide: 1204.4 pg/mL — ABNORMAL HIGH (ref 0.0–100.0)

## 2016-03-16 MED ORDER — FUROSEMIDE 80 MG PO TABS
80.0000 mg | ORAL_TABLET | Freq: Two times a day (BID) | ORAL | Status: DC
  Administered 2016-03-16 – 2016-03-17 (×2): 80 mg via ORAL
  Filled 2016-03-16 (×2): qty 1

## 2016-03-16 NOTE — Progress Notes (Signed)
Subjective:  Lt LE still tender and red- venous doppler result is pending. Pt is less SOB.    Objective:  Vital Signs in the last 24 hours: Temp:  [97.3 F (36.3 C)-97.7 F (36.5 C)] 97.3 F (36.3 C) (04/06 0434) Pulse Rate:  [83-94] 90 (04/06 0731) Resp:  [16-20] 16 (04/06 0731) BP: (102-113)/(55-84) 108/66 mmHg (04/06 0434) SpO2:  [96 %-100 %] 100 % (04/06 0731) Weight:  [195 lb (88.451 kg)-217 lb 12.8 oz (98.793 kg)] 195 lb (88.451 kg) (04/06 0659)  Intake/Output from previous day:  Intake/Output Summary (Last 24 hours) at 03/16/16 1036 Last data filed at 03/16/16 0902  Gross per 24 hour  Intake   1296 ml  Output   3400 ml  Net  -2104 ml    Physical Exam: General appearance: alert, cooperative and no distress Lungs: decreased breath sounds, scattered wheezes Heart: regular rate and rhythm and decreased heart sounds Extremities: 2+ Lt LE edema with a new area of erythema, warm to touch. Rt LE now with trace edema Skin: pale, cool, dry Neurologic: Grossly normal   Rate: 86  Rhythm: atrial fibrillation and POD, LBBB, 15 beat NSVT run  Lab Results: No results for input(s): WBC, HGB, PLT in the last 72 hours.  Recent Labs  03/15/16 0225 03/15/16 1412 03/16/16 0452  NA 147*  --  143  K 3.5 2.9* 3.2*  CL 102  --  95*  CO2 30  --  34*  GLUCOSE 128*  --  106*  BUN 28*  --  25*  CREATININE 1.81*  --  1.63*   No results for input(s): TROPONINI in the last 72 hours.  Invalid input(s): CK, MB No results for input(s): INR in the last 72 hours.  Scheduled Meds: . amiodarone  200 mg Oral Daily  . amLODipine  10 mg Oral Daily  . cephALEXin  500 mg Oral 3 times per day  . Chlorhexidine Gluconate Cloth  6 each Topical Q0600  . citalopram  10 mg Oral Daily  . dabigatran  150 mg Oral Q12H  . hydrALAZINE  50 mg Oral 3 times per day  . magnesium oxide  400 mg Oral BID  . metoprolol tartrate  25 mg Oral BID  . multivitamin with minerals  1 tablet Oral Daily  .  mupirocin ointment  1 application Nasal BID  . niacin  1,000 mg Oral BID  . pantoprazole  40 mg Oral BID  . potassium chloride SA  40 mEq Oral TID  . pravastatin  80 mg Oral Daily  . sodium chloride flush  3 mL Intravenous Q12H  . tiotropium  18 mcg Inhalation Daily   Continuous Infusions: . furosemide (LASIX) infusion 8 mg/hr (03/15/16 2331)   PRN Meds:.sodium chloride, acetaminophen, albuterol, ipratropium-albuterol, ondansetron (ZOFRAN) IV, sodium chloride flush   Imaging: Imaging results have been reviewed  Cardiac Studies: Myoview Dec 2016 (pre op)  Nuclear stress EF: 25%.  The left ventricular ejection fraction is severely decreased (<30%).  Defect 1: There is a medium defect of severe severity present in the basal inferior, mid inferior and apex location.  Findings consistent with prior myocardial infarction.  This is an intermediate risk study.  1. Intermediate risk study secondary to severe LV dysfunction 2. Inferior scar w/o ischemia consistent with ISCM  Assessment/Plan:  75 yo M pt of Dr. [name] w a h/o CAD s/p CABG 1998-cath in 2007 and Aug 2013- medical Rx, ICM EF 40-45% Jan 2016 but EF 25% per  pre op Lexiscan in Dec 2016, (prior to AO Fem BPG Feb 2017). He is s/p STJ CRTD (2013), he has pAfib (last EKG in NSR 01/03/16) on Amiodarone, LBBB, h/o CVA s/p left CEA 2005, HTN, HLD, CKD stage 2,  & COPD presents with acute on chronic CHF.He admitted to be non compliant with medications for several months. Since admission he has diuresed 26 lbs. His SCr is improving slightly with diuresis. He developed LLE edema pain and redness- venous dopplers pending.     Principal Problem:   Acute respiratory failure with hypoxia (HCC) Active Problems:   Cardiomyopathy, ischemic-EF 25%    Acute on chronic systolic (congestive) heart failure (HCC)   CAD- CABG '98- cath 2007 and Aug 2013- Med Rx   Permanent atrial fibrillation [facility])   Long term current use of  anticoagulant-Pradaxa   COPD (chronic obstructive pulmonary disease) (HCC)   Chronic systolic heart failure- previous EF 40-45% Jan 2016   Automatic implantable cardioverter-defibrillator in situ   CKD (chronic kidney disease), stage II   GI bleed-Feb 2017 (being cnsidered for Watchman device)   AKI (acute kidney injury) (HCC)   NSVT (nonsustained ventricular tachycardia) (HCC)   Non compliance with medical treatment   History of stroke-2005   HLD (hyperlipidemia)   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 I/O negative 5.9L, wgt down 26 lbs. SCr has improved to 1.6. Today.  F/U BNP ordered for today is pending.   NSVT on telemetry-  K+ ordered-continue Lopressor and Amiodarone  Cellulitis and possible DVT LLE. He is on Pradaxa but apparently wasn't taking it prior to adm. Check venous doppler, consider course of Keflex if doppler is negative.   Corine Shelter PA-C 03/16/2016 10:36 AM  Patient seen and examined and history reviewed. Agree with above findings and plan. Still complaining of pain in his left leg. Swelling is much better. Weight down 26 lbs. I/0 negative 6 liters. LE doppler negative for DVT. Started on Keflex for LLE cellulitis. Will switch IV lasix to po today. Renal function is doing better.   Marshella Tello [place], MDFACC 03/16/2016 11:03 AM

## 2016-03-16 NOTE — Progress Notes (Signed)
Pt had 15 beats of VTach, asymptomatic, denies chest pain, denies nausea and vomiting,not in respiratory distress. Will continue to monitor pt

## 2016-03-17 LAB — BASIC METABOLIC PANEL
Anion gap: 12 (ref 5–15)
BUN: 32 mg/dL — ABNORMAL HIGH (ref 6–20)
CO2: 33 mmol/L — ABNORMAL HIGH (ref 22–32)
Calcium: 8.4 mg/dL — ABNORMAL LOW (ref 8.9–10.3)
Chloride: 97 mmol/L — ABNORMAL LOW (ref 101–111)
Creatinine, Ser: 1.94 mg/dL — ABNORMAL HIGH (ref 0.61–1.24)
GFR calc Af Amer: 38 mL/min — ABNORMAL LOW (ref >60)
GFR calc non Af Amer: 33 mL/min — ABNORMAL LOW (ref >60)
Glucose, Bld: 99 mg/dL (ref 65–99)
Potassium: 3.5 mmol/L (ref 3.5–5.1)
Sodium: 142 mmol/L (ref 135–145)

## 2016-03-17 MED ORDER — FUROSEMIDE 40 MG PO TABS
40.0000 mg | ORAL_TABLET | Freq: Two times a day (BID) | ORAL | Status: DC
  Administered 2016-03-17 – 2016-03-20 (×6): 40 mg via ORAL
  Filled 2016-03-17 (×6): qty 1

## 2016-03-17 NOTE — Progress Notes (Signed)
Patient Name: [name] [name] Date of Encounter: 03/17/2016  Principal Problem:   Acute respiratory failure with hypoxia [facility]) Active Problems:   CAD- CABG '98- cath 2007 and Aug 2013- Med Rx   Cardiomyopathy, ischemic-EF 25%    Permanent atrial fibrillation (HCC)   Long term current use of anticoagulant-Pradaxa   COPD (chronic obstructive pulmonary disease) (HCC)   Chronic systolic heart failure- previous EF 40-45% Jan 2016   Automatic implantable cardioverter-defibrillator in situ   History of stroke-2005   HLD (hyperlipidemia)   CKD (chronic kidney disease), stage II   AAA - s/p AO Fem BPG Feb 2017   GI bleed-Feb 2017 (being cnsidered for Watchman device)   Acute on chronic systolic (congestive) heart failure (HCC)   AKI (acute kidney injury) (HCC)   NSVT (nonsustained ventricular tachycardia) (HCC)   Non compliance with medical treatment   Primary Cardiologist: Dr [name] Patient Profile: 75 yo M w/ a h/o CABG, ICM (EF 25% per Lexiscan 11/2015, stable ) s/p STJ CRTD (2013), pAfib (last EKG in NSR 01/03/16) on Amiodarone, LBBB, 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   SUBJECTIVE: Breathing better, leg looks better, weak. Remembers being changed to Pradaxa after a CVA on coumadin.  OBJECTIVE Filed Vitals:   03/16/16 2053 03/16/16 2055 03/17/16 0457 03/17/16 0847  BP: 91/63  100/52 105/74  Pulse: 83 83 63 102  Temp: 97.8 F (36.6 C)  98.1 F (36.7 C)   TempSrc: Axillary  Oral   Resp:  18 19   Height:      Weight:   195 lb 1.7 oz (88.5 kg)   SpO2: 96% 96% 96% 100%    Intake/Output Summary (Last 24 hours) at 03/17/16 1032 Last data filed at 03/17/16 0917  Gross per 24 hour  Intake   1044 ml  Output    900 ml  Net    144 ml   Filed Weights   03/16/16 0434 03/16/16 0659 03/17/16 0457  Weight: 217 lb 12.8 oz (98.793 kg) 195 lb  (88.451 kg) 195 lb 1.7 oz (88.5 kg)    PHYSICAL EXAM General: Well developed, well nourished, male in no acute distress. Head: Normocephalic, atraumatic.  Neck: Supple without bruits, JVD not elevated. Lungs:  Resp regular and unlabored, decreased BS bases, few rales. Heart: slightly irregular R&R, S1, S2, no S3, S4, or murmur; no rub. Abdomen: Soft, non-tender, non-distended, BS + x 4.  Extremities: No clubbing, cyanosis, trace edema. Erythema on LLE seen Neuro: Alert and oriented X 3. Moves all extremities spontaneously. Psych: Normal affect.  LABS: Basic Metabolic Panel: Recent Labs  03/15/16 0117  03/16/16 0452 03/17/16 0343  NA  --   < > 143 142  K 3.7  < > 3.2* 3.5  CL  --   < > 95* 97*  CO2  --   < > 34* 33*  GLUCOSE  --   < > 106* 99  BUN  --   < > 25* 32*  CREATININE  --   < > 1.63* 1.94*  CALCIUM  --   < > 8.7* 8.4*  MG 1.7  --   --   --   < > = values in this interval not displayed.  BNP:  B NATRIURETIC PEPTIDE  Date/Time Value Ref Range Status  03/16/2016 11:00 AM 1204.4* 0.0 - 100.0 pg/mL Final  03/12/2016 04:30 PM 1859.3* 0.0 - 100.0 pg/mL Final   TELE:  Atrial fib, occ paced beats, brief NSVT      Current Medications:  . amiodarone  200 mg Oral Daily  . amLODipine  10 mg Oral Daily  . cephALEXin  500 mg Oral 3 times per day  . citalopram  10 mg Oral Daily  . dabigatran  150 mg Oral Q12H  . furosemide  80 mg Oral BID  . hydrALAZINE  50 mg Oral 3 times per day  . magnesium oxide  400 mg Oral BID  . metoprolol tartrate  25 mg Oral BID  . multivitamin with minerals  1 tablet Oral Daily  . niacin  1,000 mg Oral BID  . pantoprazole  40 mg Oral BID  . potassium chloride SA  40 mEq Oral TID  . pravastatin  80 mg Oral Daily  . sodium chloride flush  3 mL Intravenous Q12H  . tiotropium  18 mcg Inhalation Daily      ASSESSMENT AND PLAN: Principal Problem:   Acute respiratory failure with hypoxia (HCC) - pt is at dry weight.  - Now on po Lasix at 80  mg bid, think OK to decrease to 40 mg bid - follow BMET in am     LE cellulitis - no DVT - on ABX    Long term current use of anticoagulant-Pradaxa - Pt changed from coumadin to Pradaxa 01/2013  - 2014, He came in as Code Stroke and his INR was 2.10, posterior circulation infarct suspected, considered a coumadin treatment failure at the time - Case manager checking to see if he qualifies for assistance with Pradaxa  Otherwise, continue current therapies, possible d/c in am if does well. 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 Agree with above findings and plan. [name] continues to improve. Left leg still painful but less swollen and hot. He is below baseline weight. Will decrease lasix to 40 mg bid and see if we can maintain. The larger is that he was not taking meds for >4 months due to inability to afford. Case management assessing assistance. Was previously being considered for Watchman device but this will need to be postponed until he can demonstrate compliance with meds.   [name], MDFACC 03/17/2016 11:48 AM

## 2016-03-18 LAB — BASIC METABOLIC PANEL
Anion gap: 12 (ref 5–15)
BUN: 32 mg/dL — ABNORMAL HIGH (ref 6–20)
CO2: 29 mmol/L (ref 22–32)
Calcium: 8.1 mg/dL — ABNORMAL LOW (ref 8.9–10.3)
Chloride: 103 mmol/L (ref 101–111)
Creatinine, Ser: 1.66 mg/dL — ABNORMAL HIGH (ref 0.61–1.24)
GFR calc Af Amer: 46 mL/min — ABNORMAL LOW (ref >60)
GFR calc non Af Amer: 39 mL/min — ABNORMAL LOW (ref >60)
Glucose, Bld: 112 mg/dL — ABNORMAL HIGH (ref 65–99)
Potassium: 3.4 mmol/L — ABNORMAL LOW (ref 3.5–5.1)
Sodium: 144 mmol/L (ref 135–145)

## 2016-03-18 MED ORDER — AMLODIPINE BESYLATE 5 MG PO TABS
5.0000 mg | ORAL_TABLET | Freq: Every day | ORAL | Status: DC
  Administered 2016-03-19 – 2016-03-20 (×2): 5 mg via ORAL
  Filled 2016-03-18 (×2): qty 1

## 2016-03-18 NOTE — Progress Notes (Signed)
Patient had 10 beats on nonsustained ventricular tachycardia while sleeping.Patient asymptomatic denies chest pain or shortness of breath at present time blood pressure 93/49.Will continue to monitor patient.

## 2016-03-18 NOTE — Progress Notes (Signed)
Subjective:  Still has erythema of his left lower leg and the leg feels tight.  Breathing is somewhat better.  Objective:  Vital Signs in the last 24 hours: BP 107/57 mmHg  Pulse 71  Temp(Src) 97.4 F (36.3 C) (Oral)  Resp 18  Ht 6\' 1"  (1.854 m)  Wt 88.406 kg (194 lb 14.4 oz)  BMI 25.72 kg/m2  SpO2 94%  Physical Exam: Elderly male in no acute distress Lungs:  Clear Cardiac: Irregular rhythm, normal S1 and S2, no S3 Abdomen:  Soft, nontender, no masses Extremities:  3+ edema of his left lower leg and significant erythema still  Intake/Output from previous day: 04/07 0701 - 04/08 0700 In: 360 [P.O.:360] Out: 1050 [Urine:1050]  Weight Filed Weights   03/16/16 0659 03/17/16 0457 03/18/16 0602  Weight: 88.451 kg (195 lb) 88.5 kg (195 lb 1.7 oz) 88.406 kg (194 lb 14.4 oz)    Lab Results: Basic Metabolic Panel:  Recent Labs  33/00/76 0343 03/18/16 0356  NA 142 144  K 3.5 3.4*  CL 97* 103  CO2 33* 29  GLUCOSE 99 112*  BUN 32* 32*  CREATININE 1.94* 1.66*    Telemetry: Reviewed   Assessment/Plan:  1.  Cellulitis of left lower extremity 2.  Acute on chronic systolic heart failure diuresing 3.  Ischemic cardiomyopathy 4.  Chronic atrial fibrillation 5.  Long-term use of anticoagulation 6.  Acute on chronic kidney disease but improving  Recommendations:  Replace potassium.  Continue diuresis.     Darden Palmer  MD [facility] Card[facility]  03/18/2016, 10:54 AM

## 2016-03-19 LAB — BASIC METABOLIC PANEL
Anion gap: 12 (ref 5–15)
BUN: 27 mg/dL — ABNORMAL HIGH (ref 6–20)
CO2: 29 mmol/L (ref 22–32)
Calcium: 8.5 mg/dL — ABNORMAL LOW (ref 8.9–10.3)
Chloride: 104 mmol/L (ref 101–111)
Creatinine, Ser: 1.51 mg/dL — ABNORMAL HIGH (ref 0.61–1.24)
GFR calc Af Amer: 51 mL/min — ABNORMAL LOW (ref >60)
GFR calc non Af Amer: 44 mL/min — ABNORMAL LOW (ref >60)
Glucose, Bld: 118 mg/dL — ABNORMAL HIGH (ref 65–99)
Potassium: 3.7 mmol/L (ref 3.5–5.1)
Sodium: 145 mmol/L (ref 135–145)

## 2016-03-19 NOTE — Progress Notes (Signed)
Subjective:  Still has erythema of his left lower leg and the leg feels tight.  Breathing is somewhat better.  He is asking about going home.  Still some difficulty walking on the foot.  Objective:  Vital Signs in the last 24 hours: BP 99/50 mmHg  Pulse 76  Temp(Src) 98.7 F (37.1 C) (Oral)  Resp 18  Ht 6\' 1"  (1.854 m)  Wt 87.227 kg (192 lb 4.8 oz)  BMI 25.38 kg/m2  SpO2 93%  Physical Exam: Elderly male in no acute distress Lungs:  Clear Cardiac: Irregular rhythm, normal S1 and S2, no S3 Abdomen:  Soft, nontender, no masses Extremities:  3+ edema of his left lower leg and significant erythema still, nontender.  He has a linear scab in the lower part of his leg and foot but is not sure when this happened.  Intake/Output from previous day: 04/08 0701 - 04/09 0700 In: 480 [P.O.:480] Out: 500 [Urine:500]  Weight Filed Weights   03/17/16 0457 03/18/16 0602 03/19/16 0530  Weight: 88.5 kg (195 lb 1.7 oz) 88.406 kg (194 lb 14.4 oz) 87.227 kg (192 lb 4.8 oz)    Lab Results: Basic Metabolic Panel:  Recent Labs  96/78/93 0356 03/19/16 0323  NA 144 145  K 3.4* 3.7  CL 103 104  CO2 29 29  GLUCOSE 112* 118*  BUN 32* 27*  CREATININE 1.66* 1.51*    Telemetry: ReviewedThe couple of episodes of nonsustained ventricular tachycardia.   Assessment/Plan:  1.  Cellulitis of left lower extremityBut continues. 2.  Acute on chronic systolic heart failure diuresing 3.  Ischemic cardiomyopathy 4.  Chronic atrial fibrillation 5.  Long-term use of anticoagulation 6.  Acute on chronic kidney disease but improving 7.  Nonsustained ventricular tachycardia  Recommendations:  His cellulitis is still red and foot is still markedly swollen.  I think he sustained the hospital another day and continue diuresis.  Renal function continues to improve.     Darden Palmer  MD [facility] Cardiology  03/19/2016, 10:18 AM

## 2016-03-19 NOTE — Progress Notes (Signed)
8 and 18 beat runs of ventricular tachycardia noted.  Multiple runs of similar rhythm since admission.  AICD present without firing.  Patient is asymptomatic.  Will continue to monitor.  Strips recorded under CV strips tab.

## 2016-03-20 ENCOUNTER — Encounter (HOSPITAL_COMMUNITY): Payer: Self-pay | Admitting: Physician Assistant

## 2016-03-20 ENCOUNTER — Telehealth: Payer: Self-pay | Admitting: Cardiology

## 2016-03-20 DIAGNOSIS — I25810 Atherosclerosis of coronary artery bypass graft(s) without angina pectoris: Secondary | ICD-10-CM

## 2016-03-20 DIAGNOSIS — E785 Hyperlipidemia, unspecified: Secondary | ICD-10-CM

## 2016-03-20 DIAGNOSIS — R609 Edema, unspecified: Secondary | ICD-10-CM

## 2016-03-20 LAB — BASIC METABOLIC PANEL
Anion gap: 11 (ref 5–15)
BUN: 22 mg/dL — AB (ref 6–20)
CHLORIDE: 108 mmol/L (ref 101–111)
CO2: 26 mmol/L (ref 22–32)
CREATININE: 1.33 mg/dL — AB (ref 0.61–1.24)
Calcium: 8.3 mg/dL — ABNORMAL LOW (ref 8.9–10.3)
GFR calc Af Amer: 60 mL/min — ABNORMAL LOW (ref >60)
GFR calc non Af Amer: 51 mL/min — ABNORMAL LOW (ref >60)
GLUCOSE: 139 mg/dL — AB (ref 65–99)
POTASSIUM: 3.6 mmol/L (ref 3.5–5.1)
Sodium: 145 mmol/L (ref 135–145)

## 2016-03-20 MED ORDER — FUROSEMIDE 40 MG PO TABS: 40.0000 mg | ORAL_TABLET | Freq: Two times a day (BID) | ORAL | Status: DC

## 2016-03-20 MED ORDER — CEPHALEXIN 500 MG PO CAPS: 500.0000 mg | ORAL_CAPSULE | Freq: Three times a day (TID) | ORAL | Status: DC

## 2016-03-20 NOTE — Telephone Encounter (Signed)
TOC patient-appt with Scott 03-28-16 at 9a

## 2016-03-20 NOTE — Progress Notes (Signed)
Patient Name: [name] [name] Date of Encounter: 03/20/2016     Principal Problem:   Acute respiratory failure with hypoxia [facility]) Active Problems:   CAD- CABG '98- cath 2007 and Aug 2013- Med Rx   Cardiomyopathy, ischemic-EF 25%    Permanent atrial fibrillation (HCC)   Long term current use of anticoagulant-Pradaxa   COPD (chronic obstructive pulmonary disease) (HCC)   Chronic systolic heart failure- previous EF 40-45% Jan 2016   Automatic implantable cardioverter-defibrillator in situ   History of stroke-2005   HLD (hyperlipidemia)   CKD (chronic kidney disease), stage II   AAA - s/p AO Fem BPG Feb 2017   GI bleed-Feb 2017 (being cnsidered for Watchman device)   Acute on chronic systolic (congestive) heart failure (HCC)   AKI (acute kidney injury) (HCC)   NSVT (nonsustained ventricular tachycardia) (HCC)   Non compliance with medical treatment    SUBJECTIVE  Feeling well. No SOB or CP. Wants to go home.   CURRENT MEDS . amiodarone  200 mg Oral Daily  . amLODipine  5 mg Oral Daily  . cephALEXin  500 mg Oral 3 times per day  . citalopram  10 mg Oral Daily  . dabigatran  150 mg Oral Q12H  . furosemide  40 mg Oral BID  . hydrALAZINE  50 mg Oral 3 times per day  . metoprolol tartrate  25 mg Oral BID  . multivitamin with minerals  1 tablet Oral Daily  . niacin  1,000 mg Oral BID  . pantoprazole  40 mg Oral BID  . potassium chloride SA  40 mEq Oral TID  . pravastatin  80 mg Oral Daily  . sodium chloride flush  3 mL Intravenous Q12H  . tiotropium  18 mcg Inhalation Daily    OBJECTIVE  Filed Vitals:   03/19/16 1230 03/19/16 2050 03/20/16 0540 03/20/16 0752  BP: 107/57 94/59 109/64   Pulse: 79 78 72   Temp: 98.7 F (37.1 C) 97.7 F (36.5 C) 98 F (36.7 C)   TempSrc: Oral Oral Oral   Resp: 18 20 18    Height:      Weight:   191 lb 12.8 oz (87 kg)   SpO2: 96% 97% 96% 96%    Intake/Output Summary (Last 24 hours) at 03/20/16 0814 Last data filed at 03/20/16  0540  Gross per 24 hour  Intake    720 ml  Output   2375 ml  Net  -1655 ml   Filed Weights   03/18/16 0602 03/19/16 0530 03/20/16 0540  Weight: 194 lb 14.4 oz (88.406 kg) 192 lb 4.8 oz (87.227 kg) 191 lb 12.8 oz (87 kg)    PHYSICAL EXAM  General: Pleasant, NAD. Neuro: Alert and oriented X 3. Moves all extremities spontaneously. Psych: Normal affect. HEENT:  Normal  Neck: Supple without bruits or JVD. Lungs:  Resp regular and unlabored, CTA. Heart: RRR no s3, s4, or murmurs. Abdomen: Soft, non-tender, non-distended, BS + x 4.  Extremities: No clubbing, cyanosis or LLE with erythema and 2+ non pitting edema. DP/PT/Radials 2+ and equal bilaterally.  Accessory Clinical Findings  CBC No results for input(s): WBC, NEUTROABS, HGB, HCT, MCV, PLT in the last 72 hours. Basic Metabolic Panel  Recent Labs  03/18/16 0356 03/19/16 0323  NA 144 145  K 3.4* 3.7  CL 103 104  CO2 29 29  GLUCOSE 112* 118*  BUN 32* 27*  CREATININE 1.66* 1.51*  CALCIUM 8.1* 8.5*    TELE  Paced. 12 beats  NSVT  Radiology/Studies  Dg Chest Portable 1 View  03/12/2016  CLINICAL DATA:  Shortness of breath and swelling in lower extremities. EXAM: PORTABLE CHEST 1 VIEW COMPARISON:  01/04/2016 FINDINGS: The heart is enlarged but stable. Stable pacer wires. Mild tortuosity and calcification of the thoracic aorta is noted. There is vascular congestion and mild perihilar and lower lobe interstitial pulmonary edema. No definite pleural effusions. IMPRESSION: Mild CHF. Electronically Signed   By: Rudie Meyer M.D.   On: 03/12/2016 16:37    ASSESSMENT AND PLAN 75 yo M pt of Dr. [name] w a h/o CAD s/p CABG, ICM (EF 25% per Lexiscan 11/2015, stable since at least 2013) s/p STJ CRTD (2013), pAfib on Amiodarone, LBBB, AAA s/p aortic & iliac aneurysm repair with an open 22x11 [place] aorta to bilateral common iliac artery bypass (01/03/16), tobacco abuse, CVA s/p left CEA, HTN, HLD, CKD, DVT, & COPD who presented  to [facility]16 with dyspnea.Found to have acute LE cellulitis and acute on chronic systolic CHF.   Acute on chronic systolic CHF: EF 16% s/p St Jude CRT-D -- Initially started on IV diuresis. Currently on PO Lasix 40mg  BID. Net negative 8.2L and weight down 30 lbs (221--> 191) -- Creat stable but no new labs since 4/8. I will get BMET now. If creat closer to baseline we can possibly restart his home Lisinopril 40mg  daily. This can be deferred to the outpatient setting. -- Continue Lopressor 25mg  BID and hydralazine 50mg  TID. Consider adding nitrates in the place of ACE with CKD.  LE cellulitis: -- No DVT. Continue Abx: currently on Keflex 500mg  TID (day completed 4 full days). Left leg still quite swollen and red. Per patient it is much improved.   NSVT: 12 beats. Continue BB. ICD in place  Chronic AF: continue pradaxa for CHADSVASC of at least 7. Continue amiodarone 200 mg daily. Currently paced but recent device interrogation showed he was in afib >99% time . He was not taking his medications >4 months due to inability to afford. He was previously being considered for Watchman due to hx of GI bleeding, but this will need to be postponed until he can demonstrate compliance with meds per Dr. [place].  HTN: BP has been normal to soft. Home lisinopril currently held due to AKI  HLD: continue statin and Niacin.   Acute on CKD: creat up to 1.81 this admission. Creat was ~1.1 in January. Baseline 1.1-1.5. BMET today pending. Holding Lisinopril   CAD s/p CABG (1998): cath in 07/2012 RX'd medically. No ASA with pradaxa use. Continue BB and statin  AAA: s/p AO Fem BPG 01/2016   Hx of CVA:  continue statin and pradaxa. In 2014, he came in as Code Stroke and his INR was 2.10, posterior circulation infarct suspected, considered a coumadin treatment failure at the time. Pt changed from coumadin to Pradaxa 01/2013   Hx of GI bleed: admitted 2/10-2/11/17 with hematemesis attributed to a mallory-weiss  tear. He had visited Dr. Ludwig [facility] clinic 02/09/16 when the possibility of a Watchman was addressed. He was set up for a simultaneous EGD/TEE on 02/28/16. 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 The TEE was delayed another 2 weeks.  COPD: stable  Dispo: will wait on labs to come back. Possible DC today or tomorrow on continued ABx for leg and early TOC follow up in 1 week. I will also consult care management for help with medication co pays as he was non complaint due  to not being able to afford.   Billy Fischer PA-C  Pager [phone]

## 2016-03-20 NOTE — Discharge Instructions (Signed)
Information on my medicine - Pradaxa® (dabigatran) ° °This medication education was reviewed with me or my healthcare representative as part of my discharge preparation.  The pharmacist that spoke with me during my hospital stay was:  Jericca Russett Stillinger, RPH ° °Why was Pradaxa® prescribed for you? °Pradaxa® was prescribed for you to reduce the risk of forming blood clots that cause a stroke if you have a medical condition called atrial fibrillation (a type of irregular heartbeat).   ° °What do you Need to know about PradAXa®? °Take your Pradaxa® TWICE DAILY - one capsule in the morning and one tablet in the evening with or without food.  It would be best to take the doses about the same time each day. ° °The capsules should not be broken, chewed or opened - they must be swallowed whole. ° °Do not store Pradaxa in other medication containers - once the bottle is opened the Pradaxa should be used within FOUR months; throw away any capsules that haven’t been by that time. ° °Take Pradaxa® exactly as prescribed by your doctor.  DO NOT stop taking Pradaxa® without talking to the doctor who prescribed the medication.  Stopping without other stroke prevention medication to take the place of Pradaxa may increase your risk of developing a clot that causes a stroke.  Refill your prescription before you run out. ° °After discharge, you should have regular check-up appointments with your healthcare provider that is prescribing your Pradaxa®.  In the future your dose may need to be changed if your kidney function or weight changes by a significant amount. ° °What do you do if you miss a dose? °If you miss a dose, take it as soon as you remember on the same day.  If your next dose is less than 6 hours away, skip the missed dose.  Do not take two doses of PRADAXA at the same time. ° °Important Safety Information °A possible side effect of Pradaxa® is bleeding. You should call your healthcare provider right away if you  experience any of the following: °? Bleeding from an injury or your nose that does not stop. °? Unusual colored urine (red or dark brown) or unusual colored stools (red or black). °? Unusual bruising for unknown reasons. °? A serious fall or if you hit your head (even if there is no bleeding). ° °Some medicines may interact with Pradaxa® and might increase your risk of bleeding or clotting while on Pradaxa®. To help avoid this, consult your healthcare provider or pharmacist prior to using any new prescription or non-prescription medications, including herbals, vitamins, non-steroidal anti-inflammatory drugs (NSAIDs) and supplements. ° °This website has more information on Pradaxa® (dabigatran): [email or web address]://www.pradaxa.com ° ° ° ° °

## 2016-03-20 NOTE — Discharge Summary (Signed)
Discharge Summary    Patient ID: [name] [name],  MRN: [id number], DOB/AGE: Apr 23, 1941 75 y.o.  Admit date: 03/12/2016 Discharge date: 03/20/2016  Primary Care Provider: [name] Primary Cardiologist: Dr. [name]    Discharge Diagnoses    Principal Problem:   Acute on chronic systolic (congestive) heart failure [facility]) Active Problems:   CAD- CABG '98- cath 2007 and Aug 2013- Med Rx   Cardiomyopathy, ischemic-EF 25%    Permanent atrial fibrillation (HCC)   Long term current use of anticoagulant-Pradaxa   COPD (chronic obstructive pulmonary disease) (HCC)   Automatic implantable cardioverter-defibrillator in situ   History of stroke-2005   HLD (hyperlipidemia)   CKD (chronic kidney disease), stage II   AAA - s/p AO Fem BPG Feb 2017   GI bleed-Feb 2017 (being cnsidered for Watchman device)   AKI (acute kidney injury) (HCC)   NSVT (nonsustained ventricular tachycardia) (HCC)   Non compliance with medical treatment   Allergies Allergies  Allergen Reactions  . Buprenorphine Hcl Other (See Comments)    Patient stated that he did not do well on this medication. He felt like climbing the walls  . Morphine And Related Other (See Comments)    Patient stated that he did not do well on this medication. He felt like climbing the walls     History of Present Illness     75 yo M pt of Dr. [name] w a h/o CAD s/p CABG, ICM (EF 25% s/p STJ CRTD (2013), pAfib on Amiodarone and pradaxa, LBBB, AAA s/p aortic & iliac aneurysm repair with an open 22x11 [place] aorta to bilateral common iliac artery bypass (01/03/16), tobacco abuse, CVA s/p left CEA, HTN, HLD, CKD, DVT, & COPD who presented to [facility] on 03/12/16 with dyspnea.Found to have acute LE cellulitis and acute on chronic systolic CHF.   He was admitted 01/21/16 to 01/22/16 with hematemesis attributed to a mallory-weiss tear. He had visited Dr. Ludwig Clarks clinic 02/09/16 when the possibility of a Watchman was addressed. He was set up  for a simultaneous EGD/TEE on 02/28/16. 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 The TEE was delayed another 2 weeks. At that time, his Furosemide was decreased from 40 to 20 mg daily, & he was started on Diflucan.   Since that time, he noted progressive dyspnea, lower extremity swelling, abdominal distention, orthopnea, paroxysmal nocturnal dyspnea, & weight gain. He tried increasing his Furosemide back to 40 mg daily but felt that this had little effect. This culminated in an episode of chest tightness that occurred while he was driving, 1-2 hours prior to presentation to the ED on 03/12/16. In the ED he was felt to be volume overloaded with an elevated BNP, CXR with CHF, and s/s consistent with CHF. Also had AKI with creat up to 1.9. Lisinopril was held. He was admitted for IV diuresis. Hospital course was further complicated by LLE cellulitis requiring Abx.    Relevant Data - SJM CRT interrogation 01/25/16: AT/AF burden increased from 4.8 % to >99%  - Lexiscan 11/2015: EF 25%, medium-size, severe-severity scar in the basal inferior, mid inferior, & apical areas  - Cardiac cath 09/2006: LM ok, LAD occluded, AV CFX 95%, pRCA occluded; L-LAD, S-OM2/OM3, S-PDA ok     Hospital Course     Consultants: none  Acute on chronic systolic CHF: EF 78% s/p St Jude CRT-D -- Initially started on IV diuresis. Currently on PO Lasix 40mg  BID. Net negative 8.2L and weight down 30 lbs (  221--> 191). Discharge weight 191 lbs.  -- Creat elevated on admission. Lisinopril held. Creat now improved from 1.94-->1.33. Would restart ACE as an outpatient if BP allows (soft currently). -- Continue Toprol XL 25 mg and hydralazine  TID.  LE cellulitis: -- No DVT. Continue Abx: currently on Keflex  TID (day completed 4 full days). Left leg still quite swollen and red. Will send home with 6 more days. He needs early follow up with his PCP.   NSVT: 12 beats.  Continue BB. ICD in place  Chronic AF: continue pradaxa for CHADSVASC of at least 7. Continue amiodarone 200 mg daily. Currently paced but recent device interrogation showed he was in afib >99% time . He was not taking his medications >4 months due to inability to afford. He was previously being considered for Watchman due to hx of GI bleeding, but this will need to be postponed until he can demonstrate compliance with meds per Dr. [place]. May want to think about DCCV as an outpatient if he is complaint with Pradaxa.   HTN: BP has been normal to soft. Home lisinopril held due to AKI. Continue Toprol XL  daily and hydralazine  TID.   HLD: continue statin and Niacin.   Acute on CKD: creat up to 1.9 this admission. Improved to 1.33 with diuresis. Baseline 1.1-1.5. Holding Lisinopril until seen back as an outpatient.   CAD s/p CABG (1998): cath in 07/2012 RX'd medically. No ASA with pradaxa use. Continue BB and statin  AAA: s/p AO Fem BPG 01/2016   Hx of CVA: continue statin and pradaxa. In 2014, he came in as Code Stroke and his INR was 2.10, posterior circulation infarct suspected, considered a coumadin treatment failure at the time. Pt changed from coumadin to Pradaxa 01/2013   Hx of GI bleed: admitted 2/10-2/11/17 with hematemesis attributed to a mallory-weiss tear. He had visited Dr. Ludwig Clarks clinic 02/09/16 when the possibility of a Watchman was addressed. He was set up for a simultaneous EGD/TEE on 02/28/16. 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 The TEE was delayed again  COPD: stable  The patient has had an uncomplicated hospital course and is recovering well. He has been seen by Dr. Mayford Knife today and deemed ready for discharge home. All follow-up appointments have been scheduled. Smoking cessation was disscussed in length. Discharge medications are listed below.  _____________  Discharge Vitals Blood pressure 100/59, pulse 74,  temperature 97.5 F (36.4 C), temperature source Oral, resp. rate 18, height  (1.854 m), weight 191 lb 12.8 oz (87 kg), SpO2 95 %.  Filed Weights   03/18/16 0602 03/19/16 0530 03/20/16 0540  Weight: 194 lb 14.4 oz (88.406 kg) 192 lb 4.8 oz (87.227 kg) 191 lb 12.8 oz (87 kg)    Labs & Radiologic Studies     CBC No results for input(s): WBC, NEUTROABS, HGB, HCT, MCV, PLT in the last 72 hours. Basic Metabolic Panel  Recent Labs  03/19/16 0323 03/20/16 0906  NA 145 145  K 3.7 3.6  CL 104 108  CO2 29 26  GLUCOSE 118* 139*  BUN 27* 22*  CREATININE 1.51* 1.33*  CALCIUM 8.5* 8.3*    Dg Chest Portable 1 View  03/12/2016  CLINICAL DATA:  Shortness of breath and swelling in lower extremities. EXAM: PORTABLE CHEST 1 VIEW COMPARISON:  01/04/2016 FINDINGS: The heart is enlarged but stable. Stable pacer wires. Mild tortuosity and calcification of the thoracic aorta is noted. There is vascular congestion  and mild perihilar and lower lobe interstitial pulmonary edema. No definite pleural effusions. IMPRESSION: Mild CHF. Electronically Signed   By: Rudie Meyer M.D.   On: 03/12/2016 16:37     Diagnostic Studies/Procedures    BMET _____________    Disposition   Pt is being discharged home today in good condition.  Follow-up Plans & Appointments    Follow-up Information    Follow up with [name], MD.   Specialty:  Internal Medicine   Why:  Please see in the next week or so for follow up of your leg.    Contact information:   [address] [place] [address] [phone]       Follow up with Tereso Newcomer, PA-C On 03/28/2016.   Specialties:  Physician Assistant, Radiology, Interventional Cardiology   Why:  9am   Contact information:   [address] [place] [address] [phone]        Discharge Medications   Current Discharge Medication List    CONTINUE these medications which have CHANGED   Details  furosemide (LASIX) 40 MG  tablet Take 1 tablet (40 mg total) by mouth 2 (two) times daily. Qty: 60 tablet, Refills: 6      CONTINUE these medications which have NOT CHANGED   Details  albuterol (PROVENTIL HFA;VENTOLIN HFA) 108 (90 BASE) MCG/ACT inhaler Inhale 2 puffs into the lungs every 4 (four) hours as needed for wheezing. Qty: 1 Inhaler, Refills: 0    amiodarone (PACERONE) 200 MG tablet Take 200 mg by mouth daily.    amLODipine (NORVASC) 10 MG tablet Take 10 mg by mouth daily.    citalopram (CELEXA) 10 MG tablet Take 10 mg by mouth daily.    cyanocobalamin (,VITAMIN B-12,) 1000 MCG/ML injection Inject 1,000 mcg into the muscle once a week.    dabigatran (PRADAXA) 150 MG CAPS capsule Take 1 capsule (150 mg total) by mouth 2 (two) times daily. Qty: 180 capsule, Refills: 3    hydrALAZINE (APRESOLINE) 50 MG tablet Take 0.5 tablets (25 mg total) by mouth 3 (three) times daily. Qty: 135 tablet, Refills: 3   Associated Diagnoses: Atrial fibrillation, unspecified; Abdominal aortic aneurysm (HCC); Automatic implantable cardioverter-defibrillator in situ; Cardiomyopathy, ischemic; Chronic systolic heart failure (HCC); Atherosclerosis of native coronary artery of native heart without angina pectoris; Pure hypercholesterolemia; Essential hypertension, benign    ipratropium-albuterol (DUONEB) 0.5-2.5 (3) MG/3ML SOLN Take 3 mLs by nebulization every 4 (four) hours as needed (for shortness of breath). Qty: 360 mL, Refills: 5    metoprolol succinate (TOPROL-XL) 25 MG 24 hr tablet Take 1 tablet (25 mg total) by mouth daily. Please schedule appointment for refills. Qty: 90 tablet, Refills: 0    Multiple Vitamin (MULITIVITAMIN WITH MINERALS) TABS Take 1 tablet by mouth daily.    niacin (NIASPAN) 500 MG CR tablet Take 1,000 mg by mouth 2 (two) times daily.     pantoprazole (PROTONIX) 40 MG tablet Take 1 tablet (40 mg total) by mouth 2 (two) times daily. Qty: 60 tablet, Refills: 0    potassium chloride SA (K-DUR,KLOR-CON)  20 MEQ tablet Take 2 tablets three times daily. Please schedule appointment for refills. Qty: 540 tablet, Refills: 0    pravastatin (PRAVACHOL) 80 MG tablet Take 1 tablet (80 mg total) by mouth daily. Qty: 90 tablet, Refills: 2    tiotropium (SPIRIVA HANDIHALER) 18 MCG inhalation capsule Place 1 capsule (18 mcg total) into inhaler and inhale daily. Qty: 30 capsule, Refills: 2    fluconazole (DIFLUCAN)  100 MG tablet Take 1 tablet (100 mg total) by mouth daily. Qty: 7 tablet, Refills: 0    nitroGLYCERIN (NITROSTAT) 0.4 MG SL tablet Place 1 tablet (0.4 mg total) under the tongue every 5 (five) minutes as needed for chest pain. Qty: 100 tablet, Refills: 3      STOP taking these medications     lisinopril (PRINIVIL,ZESTRIL) 40 MG tablet      metoprolol tartrate (LOPRESSOR) 25 MG tablet           Outstanding Labs/Studies   BMET  Duration of Discharge Encounter   Greater than 30 minutes including physician time.  Byrd Hesselbach R PA-C 03/20/2016, 2:41 PM

## 2016-03-20 NOTE — Progress Notes (Signed)
Pt has orders to be discharged. Discharge instructions given and pt has no additional questions at this time. Medication regimen reviewed and pt educated. Pt verbalized understanding and has no additional questions. Telemetry box removed. IV removed and site in good condition. Pt stable and waiting for transportation.   Shamon Lobo RN 

## 2016-03-20 NOTE — Progress Notes (Signed)
All d/c instructions explained and given to pt.  Verbalized understanding. .  Waneda Klammer, RN. 

## 2016-03-20 NOTE — Progress Notes (Signed)
CM consult from 03/14/2016  Received referral that patient is having a hard time affording his medication. CM talked to patient, he is very proud and initially had a hard time talking about this. His spouse died September 08, 2016and he stated that his fiances were decreased by $1,400. He has Media planner with [employer] with prescription drug coverage but the co pay for Pradaxa is very expensive $100.00 per month. Patient gave me permission to talk to his daughter Stanton Kidney.  TCT Stanton Kidney [phone]); CM informed her to contact the Pradaxia medication assistance program to see if he will qualify for any assistance through their drug company, telephone # given. CM will continue to follow for DCP; B Shelba Flake [phone]

## 2016-03-21 NOTE — Telephone Encounter (Signed)
LMTCB

## 2016-03-22 NOTE — Telephone Encounter (Signed)
Patient contacted regarding discharge from [facility] on 03/20/16.  Patient understands to follow up with provider Wende Mott, PA on 03/28/16 at 0900 at [facility]. Patient understands discharge instructions? YES Patient understands medications and regiment? YES Patient understands to bring all medications to this visit? YES

## 2016-03-27 ENCOUNTER — Ambulatory Visit (INDEPENDENT_AMBULATORY_CARE_PROVIDER_SITE_OTHER): Payer: Medicare Other

## 2016-03-27 DIAGNOSIS — I5022 Chronic systolic (congestive) heart failure: Secondary | ICD-10-CM | POA: Diagnosis not present

## 2016-03-27 DIAGNOSIS — Z95810 Presence of automatic (implantable) cardiac defibrillator: Secondary | ICD-10-CM | POA: Diagnosis not present

## 2016-03-27 NOTE — Progress Notes (Signed)
Cardiology Office Note:    Date:  03/28/2016   ID:  [name] [name], DOB 1941/01/11, MRN [id number]  PCP:  [name] Rossetti, MD  Cardiologist:  Dr. Olga Millers   Electrophysiologist:  Dr. Hillis [name]   Referring MD: [name] Payment., MD   Chief Complaint  Patient presents with  . Hospitalization Follow-up    CHF; recurrent AFib    History of Present Illness:     [name] [name] is a 75 y.o. male with a hx of CAD s/p CABG, ischemic CM, combined systolic and diastolic CHF, PAF, COPD, s/p CRT-D.  LHC in 2013 with patent grafts.  He also has a history of focal aortic dissection being managed medically.  He also has a L iliac aneurysm and mild CVD with hx of CEA.  Followed by [facility] (Dr. Arbie [name]).  He is s/p CVA in 1/16.  Echo in 1/16 with EF 40-45% and mild diastolic dysfunction.  Last seen by Dr. Olga Millers in 12/16.  He was back in AFib at that time.  Rate control Rx was planned.  Amiodarone was DC'd.  He underwent aortic and iliac artery aneurysm repair by Dr. Arbie [name] in 1/17 with a aorta to bilateral CIA bypass.  Admitted in 2/17 with hematemesis 2/2 MW tear. He was seen by Dr. Johney [name] and considered a good candidate for Watchman device implant. TEE was planned. However, EGD demonstrated a distal stricture and Candida esophagitis. TEE was delayed for treatment of Candida esophagitis.    Admitted 4/2-4/10 with acute on chronic combined systolic and diastolic CHF, RVR with atrial fibrillation  and acute lower extremity cellulitis.He was diuresed with IV Lasix. Weight decreased 30 pounds and he was -8.2 L. He did have worsening creatinine during his admission but this improved at discharge. ACE inhibitor was held due to soft blood pressure at discharge. Venous duplex was negative for DVT. He was treated with Keflex for cellulitis. He was continued on Pradaxa for anticoagulation. CHADS2-VASc=7.      Patient followed in the [facility] clinic.  Thoracic impedance measurements suggest fluid accumulation  4/3-4/4 and 4/11-4/15.  Impedance returned to reference line on 4/15.   Here alone. Since DC, he denies chest pain. He has chronic dyspnea with exertion. He feels that his dyspnea is back to baseline and overall stable. He does not weigh himself at home. He denies orthopnea or PND. He still has LE edema and this seems to be improving. Left leg is bigger than the right. Erythema is also improved on the left. Denies syncope or near syncope. He notes chronic wheezing. Denies coughing. Denies any bleeding issues. He still smokes on occasion.  Past Medical History  Diagnosis Date  . PAF (paroxysmal atrial fibrillation) (HCC)     a. on amio and pradaxa.   [name] CAD (coronary artery disease)     a. s/p CABG;  cath 10/07:  LM ok, LAD occluded, AV CFX 95%, pRCA occluded; L-LAD, S-OM2/OM3, S-PDA ok  . Cerebrovascular disease     a. s/p prior Left CEA;  followed by Dr. Arbie [name]  . COPD (chronic obstructive pulmonary disease) (HCC)   . Hyperlipidemia   . Hypertension   . Nephrolithiasis   . Ischemic cardiomyopathy     a. EF 25% by last myoview in 11/2015. s/p CRT-D  . Iliac artery aneurysm, left (HCC)     followed by Dr. Arbie [name]  . Aortic dissection (HCC)     H/O focal aortic dissection  . LBBB (left bundle branch block)   .  CKD (chronic kidney disease)   . Stroke [facility])     a. on coumadin --> switched to pradaxa  . CVA (cerebral infarction)   . DVT (deep venous thrombosis) (HCC)   . AICD (automatic cardioverter/defibrillator) present     Past Surgical History  Procedure Laterality Date  . Uteroscopy    . Coronary artery bypass graft  1998  . Appendectomy  09/11/11  . Biventricular defibrillator implantation  08/22/12    SJM Quadra Assura BiV ICD implanted by Dr [name] [name]  . Spine surgery  Sep 27, 2014    Lower Back L 4  and   L 5  . Bi-ventricular implantable cardioverter defibrillator N/A 08/22/2012    Procedure: BI-VENTRICULAR IMPLANTABLE CARDIOVERTER DEFIBRILLATOR  (CRT-D);  Surgeon: [name] Range,  MD;  Location: [facility] CATH LAB;  Service: Cardiovascular;  Laterality: N/A;  . Carotid endarterectomy  July  2005    left  . Cardiac catheterization  2013  . Abdominal aortic aneurysm repair N/A 01/03/2016    Procedure: ANEURYSM ABDOMINAL AORTA, BILATERAL COMMON ILIAC REPAIR;  Surgeon: [name] Earthly, MD;  Location: [facility] OR;  Service: Vascular;  Laterality: N/A;  . Esophagogastroduodenoscopy N/A 02/28/2016    Procedure: ESOPHAGOGASTRODUODENOSCOPY (EGD);  Surgeon: [name] Dare, MD;  Location: [facility] ENDOSCOPY;  Service: Endoscopy;  Laterality: N/A;    Current Medications: Outpatient Prescriptions Prior to Visit  Medication Sig Dispense Refill  . albuterol (PROVENTIL HFA;VENTOLIN HFA) 108 (90 BASE) MCG/ACT inhaler Inhale 2 puffs into the lungs every 4 (four) hours as needed for wheezing. 1 Inhaler 0  . amiodarone (PACERONE) 200 MG tablet Take 200 mg by mouth daily.    [name] amLODipine (NORVASC) 10 MG tablet Take 10 mg by mouth daily.    . citalopram (CELEXA) 10 MG tablet Take 10 mg by mouth daily.    . cyanocobalamin (,VITAMIN B-12,) 1000 MCG/ML injection Inject 1,000 mcg into the muscle once a week.    . dabigatran (PRADAXA) 150 MG CAPS capsule Take 1 capsule (150 mg total) by mouth 2 (two) times daily. 180 capsule 3  . fluconazole (DIFLUCAN) 100 MG tablet Take 1 tablet (100 mg total) by mouth daily. 7 tablet 0  . hydrALAZINE (APRESOLINE) 50 MG tablet Take 0.5 tablets (25 mg total) by mouth 3 (three) times daily. 135 tablet 3  . ipratropium-albuterol (DUONEB) 0.5-2.5 (3) MG/3ML SOLN Take 3 mLs by nebulization every 4 (four) hours as needed (for shortness of breath). 360 mL 5  . metoprolol succinate (TOPROL-XL) 25 MG 24 hr tablet Take 1 tablet (25 mg total) by mouth daily. Please schedule appointment for refills. 90 tablet 0  . Multiple Vitamin (MULITIVITAMIN WITH MINERALS) TABS Take 1 tablet by mouth daily.    . niacin (NIASPAN) 500 MG CR tablet Take 1,000 mg by mouth 2 (two) times daily.     .  nitroGLYCERIN (NITROSTAT) 0.4 MG SL tablet Place 1 tablet (0.4 mg total) under the tongue every 5 (five) minutes as needed for chest pain. 100 tablet 3  . pantoprazole (PROTONIX) 40 MG tablet Take 1 tablet (40 mg total) by mouth 2 (two) times daily. 60 tablet 0  . potassium chloride SA (K-DUR,KLOR-CON) 20 MEQ tablet Take 2 tablets three times daily. Please schedule appointment for refills. 540 tablet 0  . pravastatin (PRAVACHOL) 80 MG tablet Take 1 tablet (80 mg total) by mouth daily. 90 tablet 2  . tiotropium (SPIRIVA HANDIHALER) 18 MCG inhalation capsule Place 1 capsule (18 mcg total) into inhaler and inhale daily. 30 capsule  2  . cephALEXin (KEFLEX) 500 MG capsule Take 1 capsule (500 mg total) by mouth every 8 (eight) hours. (Patient not taking: Reported on 03/28/2016) 18 capsule 0  . furosemide (LASIX) 40 MG tablet Take 1 tablet (40 mg total) by mouth 2 (two) times daily. (Patient not taking: Reported on 03/28/2016) 60 tablet 6   No facility-administered medications prior to visit.     Allergies:   Buprenorphine hcl and Morphine and related   Social History   Social History  . Marital Status: Married    Spouse Name: N/A  . Number of Children: 3  . Years of Education: 10 TH   Occupational History  . RETIRED---truck driver    Social History Main Topics  . Smoking status: Former Smoker -- 1.50 packs/day for 57 years    Types: Cigarettes    Quit date: 12/11/2004  . Smokeless tobacco: Never Used  . Alcohol Use: No     Comment: 6 yrs ago   . Drug Use: No  . Sexual Activity: No   Other Topics Concern  . None   Social History Narrative   Patient is married with 3 children.   Patient is right handed.   Patient has 10 th grade education.   Patient drinks tea occasionally.     Family History:  The patient's family history includes Stroke in his maternal uncle and maternal uncle. There is no history of Heart attack or Hypertension.   ROS:   Please see the history of present  illness.    Review of Systems  Constitution: Positive for decreased appetite and malaise/fatigue.  Cardiovascular: Positive for leg swelling.  Respiratory: Positive for wheezing.   Hematologic/Lymphatic: Bruises/bleeds easily.  Musculoskeletal: Positive for joint pain.  Psychiatric/Behavioral: Positive for depression.   All other systems reviewed and are negative.   Physical Exam:    VS:  BP 122/60 mmHg  Pulse 81  Ht 6\' 1"  (1.854 m)  Wt 207 lb 12.8 oz (94.257 kg)  BMI 27.42 kg/m2  SpO2 96%   GEN: Well nourished, well developed, in no acute distress HEENT: normal Neck: JVP 6-7 cm, no masses Cardiac: Normal S1/S2, irreg irreg; no murmurs, rubs, or gallops, 1+ bilat LE edema L > R;     Respiratory: decreased breath sounds bilaterally; no wheezing, rhonchi or rales GI: soft, nontender, nondistended MS: no deformity or atrophy Skin: + Erythema LLE ankle to mid shin Neuro: No focal deficits  Psych: Alert and oriented x 3, normal affect  Wt Readings from Last 3 Encounters:  03/28/16 207 lb 12.8 oz (94.257 kg)  03/20/16 191 lb 12.8 oz (87 kg)  02/28/16 205 lb (92.987 kg)      Studies/Labs Reviewed:     EKG:  EKG is  ordered today.  The ekg ordered today demonstrates AFib, HR 81, LBBB  Recent Labs: 01/21/2016: ALT 14* 03/13/2016: Hemoglobin 11.2*; Platelets 163 03/15/2016: Magnesium 1.7 03/16/2016: B Natriuretic Peptide 1204.4* 03/20/2016: BUN 22*; Creatinine, Ser 1.33*; Potassium 3.6; Sodium 145   Recent Lipid Panel    Component Value Date/Time   CHOL 144 12/24/2014 0333   TRIG 154* 12/24/2014 0333   HDL 38* 12/24/2014 0333   CHOLHDL 3.8 12/24/2014 0333   VLDL 31 12/24/2014 0333   LDLCALC 75 12/24/2014 0333   LDLDIRECT 91.8 04/28/2010 0000    Additional studies/ records that were reviewed today include:    Myoview 12/16 EF 25%, inf scar, no ischemia, Intermediate Risk due to reduced EF  Carotid US 11/16 R <  40%; L CEA patent with < 40%  AAA [place] 5/16 AAA 4.52 x  4.58 cm  Echo 1/16 Mild LVH, EF 40-45%, diff HK, inf AK, Gr 1 DD, mod LAE  LHC 8/13 Left mainstem: LM normal.  Left anterior descending (LAD): 100 proximal. LAD fills via LIMA. No high grade distal disease. D1 moderated sized and fills via SVG. Left circumflex (LCx): AV groove diffuse luminal irregularities. Mid 25%. Small OM2 with ostial 95%. MOM with subtotal stenosis. There is filling of two moderate sized OMs with SVG. Both of these vessels are occluded proximally but demonstrated no high grade disease after the graft anastomosis.  Right coronary artery (RCA): Occluded proximally. PDA is moderate sized and fills via SVG. Two moderate sized PLs backfill via this graft and are free if disease. Grafts:  LIMA: Patent to the LAD. SVG to Diag: Patent with proximal circumferential 50% proximal stenosis and mild luminal irregularities. SVG to OM x 2: Patent with mild luminal irregularities. SVG to RCA: Mild luminal irregularities with mid 40% stenosis. Left ventriculography: Left ventricular systolic function is reduced, LVEF is estimated at 35%, there is no significant mitral regurgitation.  Final Conclusions: Ischemic cardiomyopathy with patent bypass grafts as above.    ASSESSMENT:     1. Chronic combined systolic and diastolic CHF (congestive heart failure) (HCC)   2. Coronary artery disease involving native coronary artery of native heart without angina pectoris   3. Chronic atrial fibrillation (HCC)   4. ICD (implantable cardioverter-defibrillator), biventricular, in situ   5. PAD (peripheral artery disease) (HCC)   6. Left leg cellulitis   7. Essential hypertension   8. Hyperlipidemia   9. CKD (chronic kidney disease), stage 2 (mild)     PLAN:     In order of problems listed above:  1. Chronic combined systolic and diastolic CHF - EF by Myoview in 12/16 was 25%. Echo in 1/16 with EF 40-45%. He is status post recent admission for volume excess. He  had significant diuresis and weight loss with IV Lasix. He does not weigh himself at home. He has had questionable adherence to medications in the past. He appears to be somewhat volume overloaded by exam today. However, he recently had thoracic impedance measurements indicating normal fluid status. Overall, his weights appear to be at baseline.   -  Obtain repeat BMET, BNP today  -  Adjust furosemide if BNP is significantly elevated  -  Consider resuming ACE inhibitor if renal function improved  -  Continue beta blocker, hydralazine.  -  Repeat remote ICM device check in 2 weeks  -  I have asked him to obtain a scale and maintain daily weights  2. CAD - No angina. He has not aspirin as he is on Pradaxa. Continue beta blocker, statin.  3. Chronic atrial fibrillation - Rate is controlled on amiodarone and Toprol-XL. As his atrial fibrillation is chronic, I suspect amiodarone has been discontinued for rate control. He has been considered for Watchman device implantation. TEE has been delayed on several occasions due to issues with his esophagus. I will make sure he has follow-up with Dr. [name] to further discuss the timing of his TEE. Currently tolerating Pradaxa. Continue current therapy.  4. S/p CRT-D - FU with EP as planned.  5. PAD - Follow-up with [facility] as planned.  6. LE cellulitis - Improved.  7. HTN - Blood pressure controlled.  8. HL - Continue statin.  9. CKD - Repeat BMET today.   Medication Adjustments/Labs and  Tests Ordered: Current medicines are reviewed at length with the patient today.  Concerns regarding medicines are outlined above.  Medication changes, Labs and Tests ordered today are outlined in the Patient Instructions noted below. Patient Instructions  Medication Instructions:  Your physician recommends that you continue on your current medications as directed. Please refer to the Current Medication list given to you today. Labwork: TODAY BMET,  BNP Testing/Procedures: NONE Follow-Up: DR. Genice Rouge IN 3-4 WEEKS Any Other Special Instructions Will Be Listed Below (If Applicable). WEIGH DAILY AND CALL IF YOUR WEIGHT IS UP 3 LB'S IN 1 DAY; [phone] If you need a refill on your cardiac medications before your next appointment, please call your pharmacy.     Signed, Tereso Newcomer, PA-C  03/28/2016 1:09 PM    [facility] [address] [place], [place], [place]  [address] Phone: [phone]; Fax: [phone]

## 2016-03-27 NOTE — Progress Notes (Signed)
EPIC Encounter for ICM Monitoring  Patient Name: [name] [name] is a 75 y.o. male Date: 03/27/2016 Primary Care Physican: [name], MD Primary Cardiologist: [name] Electrophysiologist: [name] Dry Weight: Unknown   Bi-V Pacing 14%      In the past month, have you:  1. Gained more than 2 pounds in a day or more than 5 pounds in a week? Yes and was hospitalized 03/12/2016 to 03/20/2016  2. Had changes in your medications (with verification of current medications)? Yes, Lasix 40 mg bid  3. Had more shortness of breath than is usual for you? Yes  4. Limited your activity because of shortness of breath? no  5. Not been able to sleep because of shortness of breath? no  6. Had increased swelling in your feet or ankles? Yes  7. Had symptoms of dehydration (dizziness, dry mouth, increased thirst, decreased urine output) no  8. Had changes in sodium restriction? no  9. Been compliant with medication? Yes   ICM trend: 3 month view for 03/27/2016   ICM trend: 1 year view for 03/27/2016   Follow-up plan: ICM clinic phone appointment on 04/12/2016.  Thoracic impedance below reference line from 02/26/2016 to 03/01/2016, 03/02/2016 to 03/05/2016, 03/08/2016 to 03/10/2016, 03/13/2016 to 03/14/2016 and 03/21/2016 to 03/25/2016 suggesting fluid accumulation. Thoracic impedance returned to reference line 03/25/2016.   Patient hospitalized for gain of 16 lbs, bilateral leg edema and SOB on 03/12/2016 to 03/20/2016.   Patient reported he has discharge orders and been taking meds as prescribed.  He has an appointment with Tereso Newcomer, PA, 03/28/2016.  Reviewed fluid symptoms and encouraged to call for any symptoms that develop.  He denied any symptoms today.   No changes today.    Patient has appointment with Tereso Newcomer, PA,  for post hospital follow up on 03/28/2016 so no changes made today at call.   Copy of note sent to patient's primary care physician, primary cardiologist, and device following  physician.  Karie Soda, RN, CCM 03/27/2016 10:27 AM

## 2016-03-28 ENCOUNTER — Ambulatory Visit (INDEPENDENT_AMBULATORY_CARE_PROVIDER_SITE_OTHER): Payer: Medicare Other | Admitting: Physician Assistant

## 2016-03-28 ENCOUNTER — Encounter: Payer: Self-pay | Admitting: Physician Assistant

## 2016-03-28 ENCOUNTER — Telehealth: Payer: Self-pay | Admitting: *Deleted

## 2016-03-28 VITALS — BP 122/60 | HR 81 | Ht 73.0 in | Wt 207.8 lb

## 2016-03-28 DIAGNOSIS — I10 Essential (primary) hypertension: Secondary | ICD-10-CM

## 2016-03-28 DIAGNOSIS — I482 Chronic atrial fibrillation, unspecified: Secondary | ICD-10-CM

## 2016-03-28 DIAGNOSIS — L03116 Cellulitis of left lower limb: Secondary | ICD-10-CM

## 2016-03-28 DIAGNOSIS — E785 Hyperlipidemia, unspecified: Secondary | ICD-10-CM

## 2016-03-28 DIAGNOSIS — I739 Peripheral vascular disease, unspecified: Secondary | ICD-10-CM

## 2016-03-28 DIAGNOSIS — I5042 Chronic combined systolic (congestive) and diastolic (congestive) heart failure: Secondary | ICD-10-CM | POA: Diagnosis not present

## 2016-03-28 DIAGNOSIS — Z95810 Presence of automatic (implantable) cardiac defibrillator: Secondary | ICD-10-CM

## 2016-03-28 DIAGNOSIS — I5023 Acute on chronic systolic (congestive) heart failure: Secondary | ICD-10-CM

## 2016-03-28 DIAGNOSIS — I2510 Atherosclerotic heart disease of native coronary artery without angina pectoris: Secondary | ICD-10-CM | POA: Diagnosis not present

## 2016-03-28 DIAGNOSIS — N182 Chronic kidney disease, stage 2 (mild): Secondary | ICD-10-CM

## 2016-03-28 LAB — BASIC METABOLIC PANEL
BUN: 13 mg/dL (ref 7–25)
CALCIUM: 8.4 mg/dL — AB (ref 8.6–10.3)
CHLORIDE: 109 mmol/L (ref 98–110)
CO2: 24 mmol/L (ref 20–31)
CREATININE: 1.11 mg/dL (ref 0.70–1.18)
Glucose, Bld: 113 mg/dL — ABNORMAL HIGH (ref 65–99)
Potassium: 3.9 mmol/L (ref 3.5–5.3)
Sodium: 143 mmol/L (ref 135–146)

## 2016-03-28 LAB — BRAIN NATRIURETIC PEPTIDE: Brain Natriuretic Peptide: 1794.7 pg/mL — ABNORMAL HIGH (ref <100)

## 2016-03-28 MED ORDER — POTASSIUM CHLORIDE CRYS ER 20 MEQ PO TBCR: EXTENDED_RELEASE_TABLET | ORAL | Status: DC

## 2016-03-28 MED ORDER — FUROSEMIDE 20 MG PO TABS
40.0000 mg | ORAL_TABLET | Freq: Two times a day (BID) | ORAL | Status: DC
Start: 2016-03-28 — End: 2016-04-10

## 2016-03-28 NOTE — Patient Instructions (Addendum)
Medication Instructions:  Your physician recommends that you continue on your current medications as directed. Please refer to the Current Medication list given to you today. Labwork: TODAY BMET, BNP Testing/Procedures: NONE Follow-Up: DR. Genice Rouge IN 3-4 WEEKS Any Other Special Instructions Will Be Listed Below (If Applicable). WEIGH DAILY AND CALL IF YOUR WEIGHT IS UP 3 LB'S IN 1 DAY; [phone] If you need a refill on your cardiac medications before your next appointment, please call your pharmacy.

## 2016-03-28 NOTE — Telephone Encounter (Signed)
Pt notified of lab results and findings. Pt verified he is taking K+ 40 meq TID; Pt has been made aware to increase K+ to 80 meq BID, increase lasix to 40 mg BID, BMET 4/26. Pt agreeable to plan of care with verbal understanding .

## 2016-03-31 ENCOUNTER — Encounter: Payer: Self-pay | Admitting: Gastroenterology

## 2016-03-31 ENCOUNTER — Ambulatory Visit (INDEPENDENT_AMBULATORY_CARE_PROVIDER_SITE_OTHER): Payer: Medicare Other | Admitting: Gastroenterology

## 2016-03-31 VITALS — BP 120/60 | HR 79 | Ht 73.0 in | Wt 204.0 lb

## 2016-03-31 DIAGNOSIS — I255 Ischemic cardiomyopathy: Secondary | ICD-10-CM

## 2016-03-31 DIAGNOSIS — Z1211 Encounter for screening for malignant neoplasm of colon: Secondary | ICD-10-CM | POA: Diagnosis not present

## 2016-03-31 DIAGNOSIS — K219 Gastro-esophageal reflux disease without esophagitis: Secondary | ICD-10-CM

## 2016-03-31 DIAGNOSIS — K222 Esophageal obstruction: Secondary | ICD-10-CM

## 2016-03-31 NOTE — Progress Notes (Signed)
[name] [name]    [id number]    11/05/1941  Primary Care Physician:GRISSO,GREG, MD  Referring Physician: Gordan Payment, MD [address] [place], [place] [address]  Chief complaint: GERD, esophageal stricture  HPI: 75 year old male with past medical history hypertension, hyperlipidemia, COPD, atrial fibrillation on Pradaxa, history of stroke, combined diastolic and systolic heart failure, AICD placement, AAA, left bundle branch block, mitral valve disorder, coronary artery disease, who is status post aortic and iliac aneurysm repair with a 22 x 11 cm here for follow-up visit. He underwent EGD with dilation of benign appearing esophageal stricture to 16 mm. He reports doing well after dilation denies any dysphagia, odynophagia,  nausea, vomiting, abdominal pain, melena or bright red blood per rectum. Reports doing well from GI perspective.     Outpatient Encounter Prescriptions as of 03/31/2016  Medication Sig  . albuterol (PROVENTIL HFA;VENTOLIN HFA) 108 (90 BASE) MCG/ACT inhaler Inhale 2 puffs into the lungs every 4 (four) hours as needed for wheezing.  [name] amiodarone (PACERONE) 200 MG tablet Take 200 mg by mouth daily.  [name] amLODipine (NORVASC) 10 MG tablet Take 10 mg by mouth daily.  . citalopram (CELEXA) 10 MG tablet Take 10 mg by mouth daily.  . cyanocobalamin (,VITAMIN B-12,) 1000 MCG/ML injection Inject 1,000 mcg into the muscle once a week.  . dabigatran (PRADAXA) 150 MG CAPS capsule Take 1 capsule (150 mg total) by mouth 2 (two) times daily.  . fluconazole (DIFLUCAN) 100 MG tablet Take 1 tablet (100 mg total) by mouth daily.  . furosemide (LASIX) 20 MG tablet Take 2 tablets (40 mg total) by mouth 2 (two) times daily.  . hydrALAZINE (APRESOLINE) 50 MG tablet Take 0.5 tablets (25 mg total) by mouth 3 (three) times daily.  [name] ipratropium-albuterol (DUONEB) 0.5-2.5 (3) MG/3ML SOLN Take 3 mLs by nebulization every 4 (four) hours as needed (for shortness of breath).  .  metoprolol succinate (TOPROL-XL) 25 MG 24 hr tablet Take 1 tablet (25 mg total) by mouth daily. Please schedule appointment for refills.  . Multiple Vitamin (MULITIVITAMIN WITH MINERALS) TABS Take 1 tablet by mouth daily.  . niacin (NIASPAN) 500 MG CR tablet Take 1,000 mg by mouth 2 (two) times daily.   . nitroGLYCERIN (NITROSTAT) 0.4 MG SL tablet Place 1 tablet (0.4 mg total) under the tongue every 5 (five) minutes as needed for chest pain.  . pantoprazole (PROTONIX) 40 MG tablet Take 1 tablet (40 mg total) by mouth 2 (two) times daily.  . potassium chloride SA (K-DUR,KLOR-CON) 20 MEQ tablet Take 4 tablets twice daily.  . pravastatin (PRAVACHOL) 80 MG tablet Take 1 tablet (80 mg total) by mouth daily.  [name] tiotropium (SPIRIVA HANDIHALER) 18 MCG inhalation capsule Place 1 capsule (18 mcg total) into inhaler and inhale daily.   No facility-administered encounter medications on file as of 03/31/2016.    Allergies as of 03/31/2016 - Review Complete 03/31/2016  Allergen Reaction Noted  . Buprenorphine hcl Other (See Comments) 02/09/2016  . Morphine and related Other (See Comments) 01/21/2016    Past Medical History  Diagnosis Date  . PAF (paroxysmal atrial fibrillation) (HCC)     a. on amio and pradaxa.   [name] CAD (coronary artery disease)     a. s/p CABG;  cath 10/07:  LM ok, LAD occluded, AV CFX 95%, pRCA occluded; L-LAD, S-OM2/OM3, S-PDA ok  . Cerebrovascular disease     a. s/p prior Left CEA;  followed by  Dr. [name]  . COPD (chronic obstructive pulmonary disease) (HCC)   . Hyperlipidemia   . Hypertension   . Nephrolithiasis   . Ischemic cardiomyopathy     a. EF 25% by last myoview in 11/2015. s/p CRT-D  . Iliac artery aneurysm, left (HCC)     followed by Dr. [name]  . Aortic dissection (HCC)     H/O focal aortic dissection  . LBBB (left bundle branch block)   . CKD (chronic kidney disease)   . Stroke [facility])     a. on coumadin --> switched to pradaxa  . CVA (cerebral infarction)   .  DVT (deep venous thrombosis) (HCC)   . AICD (automatic cardioverter/defibrillator) present     Past Surgical History  Procedure Laterality Date  . Uteroscopy    . Coronary artery bypass graft  1998  . Appendectomy  09/11/11  . Biventricular defibrillator implantation  08/22/12    SJM Quadra Assura BiV ICD implanted by Dr [name]  . Spine surgery  Sep 27, 2014    Lower Back L 4  and   L 5  . Bi-ventricular implantable cardioverter defibrillator N/A 08/22/2012    Procedure: BI-VENTRICULAR IMPLANTABLE CARDIOVERTER DEFIBRILLATOR  (CRT-D);  Surgeon: [name], MD;  Location: [facility] CATH LAB;  Service: Cardiovascular;  Laterality: N/A;  . Carotid endarterectomy  July  2005    left  . Cardiac catheterization  2013  . Abdominal aortic aneurysm repair N/A 01/03/2016    Procedure: ANEURYSM ABDOMINAL AORTA, BILATERAL COMMON ILIAC REPAIR;  Surgeon: [name], MD;  Location: [facility] OR;  Service: Vascular;  Laterality: N/A;  . Esophagogastroduodenoscopy N/A 02/28/2016    Procedure: ESOPHAGOGASTRODUODENOSCOPY (EGD);  Surgeon: [name], MD;  Location[facility] ENDOSCOPY;  Service: Endoscopy;  Laterality: N/A;    Family History  Problem Relation Age of Onset  . Stroke Maternal Uncle   . Heart attack Neg Hx   . Hypertension Neg Hx   . Stroke Maternal Uncle     Social History   Social History  . Marital Status: Married    Spouse Name: N/A  . Number of Children: 3  . Years of Education: 10 TH   Occupational History  . RETIRED---truck driver    Social History Main Topics  . Smoking status: Former Smoker -- 1.50 packs/day for 57 years    Types: Cigarettes    Quit date: 12/11/2004  . Smokeless tobacco: Never Used  . Alcohol Use: No     Comment: 6 yrs ago   . Drug Use: No  . Sexual Activity: No   Other Topics Concern  . Not on file   Social History Narrative   Patient is married with 3 children.   Patient is right handed.   Patient has 10 th grade education.   Patient drinks tea  occasionally.      Review of systems: Review of Systems  Constitutional: Negative for fever and chills.  HENT: Negative.   Eyes: Negative for blurred vision.  Respiratory: Negative for cough, shortness of breath and wheezing.   Cardiovascular: Negative for chest pain and palpitations.  Gastrointestinal: as per HPI Genitourinary: Negative for dysuria, urgency, frequency and hematuria.  Musculoskeletal: Negative for myalgias, back pain and joint pain.  Skin: Negative for itching and rash.  Neurological: Negative for dizziness, tremors, focal weakness, seizures and loss of consciousness.  Endo/Heme/Allergies: Negative for environmental allergies.  Psychiatric/Behavioral: Negative for depression, suicidal ideas and hallucinations.  All other systems reviewed and are negative.   Physical  Exam: Filed Vitals:   03/31/16 1331  BP: 120/60  Pulse: 79   Gen:      No acute distress HEENT:  EOMI, sclera anicteric Neck:     No masses; no thyromegaly Lungs:    Clear to auscultation bilaterally; normal respiratory effort CV:    irregular rate and rhythm Abd:      + bowel sounds; soft, non-tender; no palpable masses, no distension Ext:    Mild edema; adequate peripheral perfusion Skin:      Warm and dry; no rash Neuro: alert and oriented x 3 Psych: normal mood and affect  Data Reviewed: EGD 02/28/2016 One mild benign-appearing, intrinsic stenosis was found 38 cm from the incisors at the EGJ. This measured 1.4 cm (inner diameter) and was easily traversed. A guidewire was placed and the scope was withdrawn. Dilation was performed with a Savary dilator with mild resistance at 14 mm, 15 mm and 16 mm. Estimated blood loss: none. Several white exudates scattered throughout the esophagus. Findings: The exam of the esophagus was otherwise normal. The entire examined stomach was normal including retroflexed view. The duodenal bulb and second portion of the duodenum were normal.  Assessment and  Plan/Recommendations:  75 year old male with history of CAD, CHF, A. fib on anticoagulation, s/p Iliac artery aneurysm repair but benign appearing esophageal stricture status post dilation to 16 mm is here for follow-up visit Patient is doing well overall and denies any complaints Continue PPI and antireflux measures He never had screening colonoscopy and is reluctant to undergo any kind of screening for colorectal cancer, discussed in detail the risks and benefits of colonoscopy and also discussed about Cologaurd (fecal immunochemical test) He will contact us if he changes his mind regarding colorectal cancer screening Return in 6-12 months for follow-up or sooner if needed  K. Scherry Ran , MD [phone] Mon-Fri 8a-5p [phone] after 5p, weekends, holidays

## 2016-03-31 NOTE — Patient Instructions (Signed)
It has been recommended to you by your physician that you have a(n)Colonoscopy  completed. Per your request, we did not schedule the procedure(s) today. Please contact our office at [phone] should you decide to have the procedure completed. 

## 2016-04-05 ENCOUNTER — Other Ambulatory Visit: Payer: Medicare Other | Admitting: *Deleted

## 2016-04-05 LAB — BASIC METABOLIC PANEL
BUN: 13 mg/dL (ref 7–25)
CALCIUM: 8.2 mg/dL — AB (ref 8.6–10.3)
CO2: 26 mmol/L (ref 20–31)
Chloride: 107 mmol/L (ref 98–110)
Creat: 1.32 mg/dL — ABNORMAL HIGH (ref 0.70–1.18)
GLUCOSE: 120 mg/dL — AB (ref 65–99)
Potassium: 3.2 mmol/L — ABNORMAL LOW (ref 3.5–5.3)
Sodium: 145 mmol/L (ref 135–146)

## 2016-04-05 NOTE — Addendum Note (Signed)
Addended byAlben Spittle, Lorin Picket T on: 04/05/2016 03:58 PM   Modules accepted: Orders

## 2016-04-05 NOTE — Addendum Note (Signed)
Addended by: Tonita Phoenix on: 04/05/2016 11:02 AM   Modules accepted: Orders

## 2016-04-06 ENCOUNTER — Inpatient Hospital Stay (HOSPITAL_COMMUNITY): Admission: RE | Admit: 2016-04-06 | Payer: Medicare Other | Source: Ambulatory Visit | Admitting: Internal Medicine

## 2016-04-06 ENCOUNTER — Encounter (HOSPITAL_COMMUNITY): Admission: RE | Payer: Self-pay | Source: Ambulatory Visit

## 2016-04-06 SURGERY — LEFT ATRIAL APPENDAGE OCCLUSION
Anesthesia: Monitor Anesthesia Care

## 2016-04-10 ENCOUNTER — Other Ambulatory Visit: Payer: Self-pay | Admitting: Nurse Practitioner

## 2016-04-10 DIAGNOSIS — I5023 Acute on chronic systolic (congestive) heart failure: Secondary | ICD-10-CM

## 2016-04-10 MED ORDER — FUROSEMIDE 20 MG PO TABS: 60.0000 mg | ORAL_TABLET | Freq: Every day | ORAL | Status: DC

## 2016-04-12 ENCOUNTER — Ambulatory Visit (INDEPENDENT_AMBULATORY_CARE_PROVIDER_SITE_OTHER): Payer: Medicare Other

## 2016-04-12 DIAGNOSIS — Z95810 Presence of automatic (implantable) cardiac defibrillator: Secondary | ICD-10-CM

## 2016-04-12 DIAGNOSIS — I5023 Acute on chronic systolic (congestive) heart failure: Secondary | ICD-10-CM

## 2016-04-12 NOTE — Progress Notes (Signed)
EPIC Encounter for ICM Monitoring  Patient Name: [name] [name] is a 75 y.o. male Date: 04/12/2016 Primary Care Physican: [name], MD Primary Cardiologist: [name] Electrophysiologist: [name] Dry Weight: Unknown - not weighing       In the past month, have you:  1. Gained more than 2 pounds in a day or more than 5 pounds in a week? Unknown.  Advised at his last office visit with Tereso Newcomer, PA that he should get a scale and weigh daily which he has not done.  Explained importance of daily weights which will help monitor for fluid weight and decrease risk of another hospitalization.  Reminded him one of the fluid symptoms he had prior to last hospitalization was weight gain of 16 lbs. Encouraged him to obtain a scale if possible.    2. Had changes in your medications (with verification of current medications)? Potassium increased and Furosemide decreased to 60 mg daily due to last BMET results.  3. Had more shortness of breath than is usual for you? no  4. Limited your activity because of shortness of breath? no  5. Not been able to sleep because of shortness of breath? no  6. Had increased swelling in your feet or ankles? no  7. Had symptoms of dehydration (dizziness, dry mouth, increased thirst, decreased urine output) no  8. Had changes in sodium restriction? no  9. Been compliant with medication? Yes  ICM trend: 3 month view for 04/12/2016  ICM trend: 1 year view for 04/12/2016    Follow-up plan: [facility] clinic phone appointment 05/03/2016 which is day before office visit with Dr [name], 05/04/2016.    FLUID LEVELS: [place] thoracic impedance slightly above baseline.    SYMPTOMS:  Denied any fluid symptoms such as worsening of SOB or lower extremity swelling. Encouraged to call for any fluid symptoms.   He stated right now he feels good.   EDUCATION:   Reminded him to limit sodium intake to < 2000 mg and fluid intake to 64 oz daily.   No changes today.    Advised will send  recheck of fluid levels to Dr. [name], Dr. [name] and Tereso Newcomer, PA for review.   Next transmission 05/03/2016.   Karie Soda, RN, CCM 04/12/2016 10:28 AM

## 2016-04-14 NOTE — Progress Notes (Signed)
Thank you! Tereso Newcomer, PA-C   04/14/2016 2:51 PM

## 2016-04-18 ENCOUNTER — Telehealth: Payer: Self-pay | Admitting: *Deleted

## 2016-04-18 ENCOUNTER — Other Ambulatory Visit (INDEPENDENT_AMBULATORY_CARE_PROVIDER_SITE_OTHER): Payer: Medicare Other

## 2016-04-18 DIAGNOSIS — I5023 Acute on chronic systolic (congestive) heart failure: Secondary | ICD-10-CM | POA: Diagnosis not present

## 2016-04-18 LAB — BASIC METABOLIC PANEL
BUN: 18 mg/dL (ref 7–25)
CHLORIDE: 107 mmol/L (ref 98–110)
CO2: 24 mmol/L (ref 20–31)
Calcium: 8.9 mg/dL (ref 8.6–10.3)
Creat: 1.38 mg/dL — ABNORMAL HIGH (ref 0.70–1.18)
GLUCOSE: 104 mg/dL — AB (ref 65–99)
Potassium: 3.6 mmol/L (ref 3.5–5.3)
SODIUM: 144 mmol/L (ref 135–146)

## 2016-04-18 NOTE — Telephone Encounter (Signed)
Pt notified of lab results by phone with verbal understanding.  

## 2016-05-02 ENCOUNTER — Ambulatory Visit: Payer: Medicare Other | Admitting: Vascular Surgery

## 2016-05-02 NOTE — Progress Notes (Signed)
HPI: Fu CAD s/p CABG, ischemic CM, combined systolic and diastolic CHF, PAF, COPD, s/p CRT-D. LHC in 2013 with patent grafts. He also has a history of focal aortic dissection being managed medically. He also has a L iliac aneurysm and mild CVD with hx of CEA. Followed by [facility] (Dr. [name]). He is s/p CVA in 1/16. Echo in 1/16 with EF 40-45% and mild diastolic dysfunction. Patient noted to be in atrial fibrillation recently and rate control/anticoagulation planned. He underwent aortic and iliac artery aneurysm repair by Dr. [name] in 1/17 with a aorta to bilateral CIA bypass. Admitted in 2/17 with hematemesis 2/2 MW tear. He was seen by Dr. [name] and considered a good candidate for Watchman device implant. TEE was planned. However, EGD demonstrated a distal stricture and Candida esophagitis. TEE was delayed for treatment of Candida esophagitis.   Admitted 4/2-4/10 with acute on chronic combined systolic and diastolic CHF, RVR with atrial fibrillation and acute lower extremity cellulitis.He was diuresed with IV Lasix. Weight decreased 30 pounds and he was -8.2 L. He did have worsening creatinine during his admission but this improved at discharge. ACE inhibitor was held due to soft blood pressure at discharge. Venous duplex was negative for DVT. He was treated with Keflex for cellulitis. He was continued on Pradaxa for anticoagulation. CHADS2-VASc=7.    Since last seen, He has some dyspnea on exertion but no orthopnea, PND, pedal edema, chest pain, palpitations, syncope or bleeding. His dysphagia has improved.  Myoview 12/16 EF 25%, inf scar, no ischemia, Intermediate Risk due to reduced EF  Carotid US 11/16 R < 40%; L CEA patent with < 40%  AAA [place] 5/16 AAA 4.52 x 4.58 cm  Echo 1/16 Mild LVH, EF 40-45%, diff HK, inf AK, Gr 1 DD, mod LAE  LHC 8/13 Left mainstem: LM normal.  Left anterior descending (LAD): 100 proximal. LAD fills via LIMA. No high grade distal disease. D1  moderated sized and fills via SVG. Left circumflex (LCx): AV groove diffuse luminal irregularities. Mid 25%. Small OM2 with ostial 95%. MOM with subtotal stenosis. There is filling of two moderate sized OMs with SVG. Both of these vessels are occluded proximally but demonstrated no high grade disease after the graft anastomosis.  Right coronary artery (RCA): Occluded proximally. PDA is moderate sized and fills via SVG. Two moderate sized PLs backfill via this graft and are free if disease. Grafts:  LIMA: Patent to the LAD. SVG to Diag: Patent with proximal circumferential 50% proximal stenosis and mild luminal irregularities. SVG to OM x 2: Patent with mild luminal irregularities. SVG to RCA: Mild luminal irregularities with mid 40% stenosis. Left ventriculography: Left ventricular systolic function is reduced, LVEF is estimated at 35%, there is no significant mitral regurgitation.  Final Conclusions: Ischemic cardiomyopathy with patent bypass grafts as above.   Current Outpatient Prescriptions  Medication Sig Dispense Refill  . albuterol (PROVENTIL HFA;VENTOLIN HFA) 108 (90 BASE) MCG/ACT inhaler Inhale 2 puffs into the lungs every 4 (four) hours as needed for wheezing. 1 Inhaler 0  . amiodarone (PACERONE) 200 MG tablet Take 200 mg by mouth daily.    [name] amLODipine (NORVASC) 10 MG tablet Take 10 mg by mouth daily.    . citalopram (CELEXA) 10 MG tablet Take 10 mg by mouth daily.    . dabigatran (PRADAXA) 150 MG CAPS capsule Take 1 capsule (150 mg total) by mouth 2 (two) times daily. 180 capsule 3  . fluconazole (DIFLUCAN) 100 MG tablet Take  1 tablet (100 mg total) by mouth daily. 7 tablet 0  . furosemide (LASIX) 20 MG tablet Take 3 tablets (60 mg total) by mouth daily. 90 tablet 11  . hydrALAZINE (APRESOLINE) 50 MG tablet Take 0.5 tablets (25 mg total) by mouth 3 (three) times daily. 135 tablet 3  . ipratropium-albuterol (DUONEB) 0.5-2.5 (3) MG/3ML SOLN Take 3 mLs by  nebulization every 4 (four) hours as needed (for shortness of breath). 360 mL 5  . metoprolol succinate (TOPROL-XL) 25 MG 24 hr tablet Take 1 tablet (25 mg total) by mouth daily. Please schedule appointment for refills. 90 tablet 0  . Multiple Vitamin (MULITIVITAMIN WITH MINERALS) TABS Take 1 tablet by mouth daily.    . niacin (NIASPAN) 500 MG CR tablet Take 1,000 mg by mouth 2 (two) times daily.     . nitroGLYCERIN (NITROSTAT) 0.4 MG SL tablet Place 1 tablet (0.4 mg total) under the tongue every 5 (five) minutes as needed for chest pain. 100 tablet 3  . pantoprazole (PROTONIX) 40 MG tablet Take 1 tablet (40 mg total) by mouth 2 (two) times daily. 60 tablet 0  . potassium chloride SA (K-DUR,KLOR-CON) 20 MEQ tablet Take 4 tablets twice daily.    . pravastatin (PRAVACHOL) 80 MG tablet Take 1 tablet (80 mg total) by mouth daily. 90 tablet 2  . tiotropium (SPIRIVA HANDIHALER) 18 MCG inhalation capsule Place 1 capsule (18 mcg total) into inhaler and inhale daily. 30 capsule 2   No current facility-administered medications for this visit.     Past Medical History  Diagnosis Date  . PAF (paroxysmal atrial fibrillation) (HCC)     a. on amio and pradaxa.   [name] CAD (coronary artery disease)     a. s/p CABG;  cath 10/07:  LM ok, LAD occluded, AV CFX 95%, pRCA occluded; L-LAD, S-OM2/OM3, S-PDA ok  . Cerebrovascular disease     a. s/p prior Left CEA;  followed by Dr. [name]  . COPD (chronic obstructive pulmonary disease) (HCC)   . Hyperlipidemia   . Hypertension   . Nephrolithiasis   . Ischemic cardiomyopathy     a. EF 25% by last myoview in 11/2015. s/p CRT-D  . Iliac artery aneurysm, left (HCC)     followed by Dr. [name]  . Aortic dissection (HCC)     H/O focal aortic dissection  . LBBB (left bundle branch block)   . CKD (chronic kidney disease)   . Stroke [facility])     a. on coumadin --> switched to pradaxa  . CVA (cerebral infarction)   . DVT (deep venous thrombosis) (HCC)   . AICD (automatic  cardioverter/defibrillator) present     Past Surgical History  Procedure Laterality Date  . Uteroscopy    . Coronary artery bypass graft  1998  . Appendectomy  09/11/11  . Biventricular defibrillator implantation  08/22/12    SJM Quadra Assura BiV ICD implanted by Dr [name]  . Spine surgery  Sep 27, 2014    Lower Back L 4  and   L 5  . Bi-ventricular implantable cardioverter defibrillator N/A 08/22/2012    Procedure: BI-VENTRICULAR IMPLANTABLE CARDIOVERTER DEFIBRILLATOR  (CRT-D);  Surgeon: [name], MD;  Location: [facility] CATH LAB;  Service: Cardiovascular;  Laterality: N/A;  . Carotid endarterectomy  July  2005    left  . Cardiac catheterization  2013  . Abdominal aortic aneurysm repair N/A 01/03/2016    Procedure: ANEURYSM ABDOMINAL AORTA, BILATERAL COMMON ILIAC REPAIR;  Surgeon: [name], MD;  Location: [facility] OR;  Service: Vascular;  Laterality: N/A;  . Esophagogastroduodenoscopy N/A 02/28/2016    Procedure: ESOPHAGOGASTRODUODENOSCOPY (EGD);  Surgeon: [name], MD;  Location: [facility] ENDOSCOPY;  Service: Endoscopy;  Laterality: N/A;    Social History   Social History  . Marital Status: Married    Spouse Name: N/A  . Number of Children: 3  . Years of Education: 10 TH   Occupational History  . RETIRED---truck driver    Social History Main Topics  . Smoking status: Former Smoker -- 1.50 packs/day for 57 years    Types: Cigarettes    Quit date: 12/11/2004  . Smokeless tobacco: Never Used  . Alcohol Use: No     Comment: 6 yrs ago   . Drug Use: No  . Sexual Activity: No   Other Topics Concern  . Not on file   Social History Narrative   Patient is married with 3 children.   Patient is right handed.   Patient has 10 th grade education.   Patient drinks tea occasionally.    Family History  Problem Relation Age of Onset  . Stroke Maternal Uncle   . Heart attack Neg Hx   . Hypertension Neg Hx   . Stroke Maternal Uncle     ROS: Pain in ribs but no fevers or chills,  productive cough, hemoptysis, dysphasia, odynophagia, melena, hematochezia, dysuria, hematuria, rash, seizure activity, orthopnea, PND, pedal edema, claudication. Remaining systems are negative.  Physical Exam: Well-developed well-nourished in no acute distress.  Skin is warm and dry. Diffuse ecchymosis.  HEENT is normal.  Neck is supple.  Chest is clear to auscultation with normal expansion.  Cardiovascular exam is regular rate and rhythm.  Abdominal exam nontender or distended. No masses palpated. Extremities show no edema. neuro grossly intact

## 2016-05-03 ENCOUNTER — Ambulatory Visit (INDEPENDENT_AMBULATORY_CARE_PROVIDER_SITE_OTHER): Payer: Medicare Other

## 2016-05-03 ENCOUNTER — Encounter: Payer: Self-pay | Admitting: Vascular Surgery

## 2016-05-03 DIAGNOSIS — Z95810 Presence of automatic (implantable) cardiac defibrillator: Secondary | ICD-10-CM | POA: Diagnosis not present

## 2016-05-03 DIAGNOSIS — I5023 Acute on chronic systolic (congestive) heart failure: Secondary | ICD-10-CM

## 2016-05-03 NOTE — Progress Notes (Signed)
EPIC Encounter for ICM Monitoring  Patient Name: [name] [name] is a 75 y.o. male Date: 05/03/2016 Primary Care Physican: [name], MD Primary Cardiologist: [name] Electrophysiologist: [name] Dry Weight: unknown   Bi-V Pacing 22% (was 97% on 10/14/2015) AT/AF Burden > 99% (was 5.6% on 10/14/2015)     In the past month, have you:  1. Gained more than 2 pounds in a day or more than 5 pounds in a week? Unknown does not weigh  2. Had changes in your medications (with verification of current medications)? No  3. Had more shortness of breath than is usual for you? No  4. Limited your activity because of shortness of breath? No  5. Not been able to sleep because of shortness of breath? No  6. Had increased swelling in your feet or ankles? No  7. Had symptoms of dehydration (dizziness, dry mouth, increased thirst, decreased urine output) No  8. Had changes in sodium restriction? N/o  9. Been compliant with medication? N/o   ICM trend: 3 month view for 05/03/2016   ICM trend: 1 year view for 05/03/2016   Follow-up plan: ICM clinic phone appointment on 06/07/2016.   Thoracic impedance below reference line from 04/29/2016 to 05/01/2016 suggesting fluid accumulation and return to baseline 05/02/2016 suggesting fluid levels are stabilizing.   He denied any fluid symptoms and stated he is feeling fine.  Advised transmission was reviewed with Dr [name] in the office today and he would like for him to make an appointment to see either him or Gypsy Balsam, NP regarding his device pacing.  He agreed to have EP scheduler call for an appointment.     Copy of note sent to patient's primary care physician, Dr [name] and Dr [name] for review at 05/04/2016 appointment.    Karie Soda, RN, CCM 05/03/2016 2:47 PM

## 2016-05-04 ENCOUNTER — Encounter: Payer: Self-pay | Admitting: Cardiology

## 2016-05-04 ENCOUNTER — Ambulatory Visit (INDEPENDENT_AMBULATORY_CARE_PROVIDER_SITE_OTHER): Payer: Medicare Other | Admitting: Cardiology

## 2016-05-04 VITALS — BP 120/75 | HR 87 | Ht 73.0 in | Wt 184.8 lb

## 2016-05-04 DIAGNOSIS — I4821 Permanent atrial fibrillation: Secondary | ICD-10-CM

## 2016-05-04 DIAGNOSIS — E785 Hyperlipidemia, unspecified: Secondary | ICD-10-CM

## 2016-05-04 DIAGNOSIS — I723 Aneurysm of iliac artery: Secondary | ICD-10-CM

## 2016-05-04 DIAGNOSIS — I481 Persistent atrial fibrillation: Secondary | ICD-10-CM

## 2016-05-04 DIAGNOSIS — I679 Cerebrovascular disease, unspecified: Secondary | ICD-10-CM | POA: Insufficient documentation

## 2016-05-04 DIAGNOSIS — I5023 Acute on chronic systolic (congestive) heart failure: Secondary | ICD-10-CM

## 2016-05-04 DIAGNOSIS — I4819 Other persistent atrial fibrillation: Secondary | ICD-10-CM

## 2016-05-04 DIAGNOSIS — I255 Ischemic cardiomyopathy: Secondary | ICD-10-CM | POA: Diagnosis not present

## 2016-05-04 DIAGNOSIS — I482 Chronic atrial fibrillation: Secondary | ICD-10-CM | POA: Diagnosis not present

## 2016-05-04 DIAGNOSIS — Z95810 Presence of automatic (implantable) cardiac defibrillator: Secondary | ICD-10-CM

## 2016-05-04 MED ORDER — LISINOPRIL 10 MG PO TABS: 10.0000 mg | ORAL_TABLET | Freq: Every day | ORAL | Status: DC

## 2016-05-04 NOTE — Assessment & Plan Note (Signed)
Plan to continue beta blocker. Discontinue hydralazine. Begin lisinopril 10 mg daily as his renal function has improved on most recent lab work. Check potassium and renal function in 1 week. Advance ACE inhibitor as needed.

## 2016-05-04 NOTE — Assessment & Plan Note (Signed)
Followed by vascular surgery. 

## 2016-05-04 NOTE — Assessment & Plan Note (Signed)
Patient is euvolemic on examination.Continue present dose of Lasix. 

## 2016-05-04 NOTE — Assessment & Plan Note (Signed)
Continue statin. 

## 2016-05-04 NOTE — Patient Instructions (Signed)
Medication Instructions:   STOP HYDRALAZINE  START LISINOPRIL 10 MG ONCE DAILY  Labwork:  Your physician recommends that you return for lab work Wednesday NEXT WEEK  Testing/Procedures:  Your physician has requested that you have a TEE. During a TEE, sound waves are used to create images of your heart. It provides your doctor with information about the size and shape of your heart and how well your heart's chambers and valves are working. In this test, a transducer is attached to the end of a flexible tube that's guided down your throat and into your esophagus (the tube leading from you mouth to your stomach) to get a more detailed image of your heart. You are not awake for the procedure. Please see the instruction sheet given to you today. For further information please visit [email or web address]  Follow-Up:  Your physician recommends that you schedule a follow-up appointment in: 3 MONTHS WITH DR [name]

## 2016-05-04 NOTE — Assessment & Plan Note (Signed)
Followed by electrophysiology. 

## 2016-05-04 NOTE — Assessment & Plan Note (Signed)
Followed by vascular surgery. Continue statin. 

## 2016-05-04 NOTE — Assessment & Plan Note (Signed)
Continue toprol and amiodarone. Check TSH and liver functions. Continue pradaxa. Patient's dysphagia has improved with fluconazole. We will arrange transesophageal echocardiogram for prewatchman eval and then FU with Dr [name].

## 2016-05-04 NOTE — Assessment & Plan Note (Signed)
Continue Statin. No aspirin given need for anticoagulation. 

## 2016-05-10 ENCOUNTER — Ambulatory Visit (INDEPENDENT_AMBULATORY_CARE_PROVIDER_SITE_OTHER): Payer: Medicare Other | Admitting: Vascular Surgery

## 2016-05-10 ENCOUNTER — Encounter: Payer: Self-pay | Admitting: Vascular Surgery

## 2016-05-10 VITALS — BP 128/76 | HR 68 | Temp 97.4°F | Resp 16 | Ht 73.0 in | Wt 189.0 lb

## 2016-05-10 DIAGNOSIS — I255 Ischemic cardiomyopathy: Secondary | ICD-10-CM | POA: Diagnosis not present

## 2016-05-10 DIAGNOSIS — I714 Abdominal aortic aneurysm, without rupture, unspecified: Secondary | ICD-10-CM

## 2016-05-10 NOTE — Progress Notes (Signed)
Vascular and Vein Specialist of [facility]  Patient name: [name] [name] MRN: [id number] DOB: [birth year] Sex: male  REASON FOR VISIT: Follow-up open repair dominant open aneurysm with aorto to bilateral common iliac artery bypass on 01/03/2016  HPI: [name] [name] is a 75 y.o. male here for aneurysm surgery follow-up. He looks quite good today. He reports that he feels the best he has in a long time. Has completely resolved his abdominal soreness and reports he is back to his baseline stamina. He has been able to maintain the weight loss that he had around time of surgery and wishes to continue this. Reports normal return of bowel function and appetite.  Past Medical History  Diagnosis Date  . PAF (paroxysmal atrial fibrillation) (HCC)     a. on amio and pradaxa.   [name] CAD (coronary artery disease)     a. s/p CABG;  cath 10/07:  LM ok, LAD occluded, AV CFX 95%, pRCA occluded; L-LAD, S-OM2/OM3, S-PDA ok  . Cerebrovascular disease     a. s/p prior Left CEA;  followed by Dr. [name]  . COPD (chronic obstructive pulmonary disease) (HCC)   . Hyperlipidemia   . Hypertension   . Nephrolithiasis   . Ischemic cardiomyopathy     a. EF 25% by last myoview in 11/2015. s/p CRT-D  . Iliac artery aneurysm, left (HCC)     followed by Dr. [name]  . Aortic dissection (HCC)     H/O focal aortic dissection  . LBBB (left bundle branch block)   . CKD (chronic kidney disease)   . Stroke [facility])     a. on coumadin --> switched to pradaxa  . CVA (cerebral infarction)   . DVT (deep venous thrombosis) (HCC)   . AICD (automatic cardioverter/defibrillator) present     Family History  Problem Relation Age of Onset  . Stroke Maternal Uncle   . Heart attack Neg Hx   . Hypertension Neg Hx   . Stroke Maternal Uncle     SOCIAL HISTORY: Social History  Substance Use Topics  . Smoking status: Former Smoker -- 1.50 packs/day for 57 years    Types: Cigarettes    Quit date: 12/11/2004  . Smokeless  tobacco: Never Used  . Alcohol Use: No     Comment: 6 yrs ago     Allergies  Allergen Reactions  . Buprenorphine Hcl Other (See Comments)    Patient stated that he did not do well on this medication. He felt like climbing the walls  . Morphine And Related Other (See Comments)    Patient stated that he did not do well on this medication. He felt like climbing the walls    Current Outpatient Prescriptions  Medication Sig Dispense Refill  . albuterol (PROVENTIL HFA;VENTOLIN HFA) 108 (90 BASE) MCG/ACT inhaler Inhale 2 puffs into the lungs every 4 (four) hours as needed for wheezing. 1 Inhaler 0  . amiodarone (PACERONE) 200 MG tablet Take 200 mg by mouth daily.    [name] amLODipine (NORVASC) 10 MG tablet Take 10 mg by mouth daily.    . citalopram (CELEXA) 10 MG tablet Take 10 mg by mouth daily.    . dabigatran (PRADAXA) 150 MG CAPS capsule Take 1 capsule (150 mg total) by mouth 2 (two) times daily. 180 capsule 3  . fluconazole (DIFLUCAN) 100 MG tablet Take 1 tablet (100 mg total) by mouth daily. 7 tablet 0  . furosemide (LASIX) 20 MG tablet Take 3 tablets (60 mg total) by mouth  daily. 90 tablet 11  . ipratropium-albuterol (DUONEB) 0.5-2.5 (3) MG/3ML SOLN Take 3 mLs by nebulization every 4 (four) hours as needed (for shortness of breath). 360 mL 5  . lisinopril (PRINIVIL,ZESTRIL) 10 MG tablet Take 1 tablet (10 mg total) by mouth daily. 90 tablet 3  . metoprolol succinate (TOPROL-XL) 25 MG 24 hr tablet Take 1 tablet (25 mg total) by mouth daily. Please schedule appointment for refills. 90 tablet 0  . Multiple Vitamin (MULITIVITAMIN WITH MINERALS) TABS Take 1 tablet by mouth daily.    . niacin (NIASPAN) 500 MG CR tablet Take 1,000 mg by mouth 2 (two) times daily.     . nitroGLYCERIN (NITROSTAT) 0.4 MG SL tablet Place 1 tablet (0.4 mg total) under the tongue every 5 (five) minutes as needed for chest pain. 100 tablet 3  . pantoprazole (PROTONIX) 40 MG tablet Take 1 tablet (40 mg total) by mouth 2 (two)  times daily. 60 tablet 0  . potassium chloride SA (K-DUR,KLOR-CON) 20 MEQ tablet Take 4 tablets twice daily.    . pravastatin (PRAVACHOL) 80 MG tablet Take 1 tablet (80 mg total) by mouth daily. 90 tablet 2  . tiotropium (SPIRIVA HANDIHALER) 18 MCG inhalation capsule Place 1 capsule (18 mcg total) into inhaler and inhale daily. 30 capsule 2   No current facility-administered medications for this visit.    REVIEW OF SYSTEMS:   denotes positive finding,  denotes negative finding Cardiac  Comments:  Chest pain or chest pressure:    Shortness of breath upon exertion: x   Short of breath when lying flat:    Irregular heart rhythm:        Vascular    Pain in calf, thigh, or hip brought on by ambulation:    Pain in feet at night that wakes you up from your sleep:     Blood clot in your veins:    Leg swelling:         Pulmonary    Oxygen at home:    Productive cough:     Wheezing:         Neurologic    Sudden weakness in arms or legs:     Sudden numbness in arms or legs:     Sudden onset of difficulty speaking or slurred speech:    Temporary loss of vision in one eye:     Problems with dizziness:         Gastrointestinal    Blood in stool:     Vomited blood:         Genitourinary    Burning when urinating:     Blood in urine:        Psychiatric    Major depression:         Hematologic    Bleeding problems:    Problems with blood clotting too easily:        Skin    Rashes or ulcers:        Constitutional    Fever or chills:      PHYSICAL EXAM: Filed Vitals:   05/10/16 1044  BP: 128/76  Pulse: 68  Temp: 97.4 F (36.3 C)  TempSrc: Oral  Resp: 16  Height:  (1.854 m)  Weight: 189 lb (85.73 kg)  SpO2: 98%    GENERAL: The patient is a well-nourished male, in no acute distress. The vital signs are documented above. CARDIAC: There is a regular rate and rhythm.  VASCULAR: 2+ radial 2+ femoral 2+ dorsalis  pedis pulses bilaterally PULMONARY: There is  good air exchange bilaterally without wheezing or rales. ABDOMEN: Soft and non-tender with normal pitched bowel sounds. Well-healed midline incision with no hernia noted. He does have a diastases rectus MUSCULOSKELETAL: There are no major deformities or cyanosis. NEUROLOGIC: No focal weakness or paresthesias are detected. SKIN: There are no ulcers or rashes noted. PSYCHIATRIC: The patient has a normal affect.  MEDICAL ISSUES: Stable overall from his open aneurysm repair. We will continue his full activity without limitation. We will see him again in 9 months for one-year follow-up. We'll obtain ankle arm indices at that time. Should he develop any difficulty in the interim    Missey Hasley [facility] Specialists of [facility] [employer] [phone]

## 2016-05-11 ENCOUNTER — Telehealth: Payer: Self-pay | Admitting: Cardiology

## 2016-05-11 ENCOUNTER — Encounter: Payer: Medicare Other | Admitting: *Deleted

## 2016-05-11 LAB — HEPATIC FUNCTION PANEL
ALBUMIN: 3.6 g/dL (ref 3.6–5.1)
ALT: 9 U/L (ref 9–46)
AST: 13 U/L (ref 10–35)
Alkaline Phosphatase: 73 U/L (ref 40–115)
BILIRUBIN DIRECT: 0.2 mg/dL (ref <=0.2)
BILIRUBIN TOTAL: 0.7 mg/dL (ref 0.2–1.2)
Indirect Bilirubin: 0.5 mg/dL (ref 0.2–1.2)
Total Protein: 5.9 g/dL — ABNORMAL LOW (ref 6.1–8.1)

## 2016-05-11 LAB — BASIC METABOLIC PANEL
BUN: 17 mg/dL (ref 7–25)
CALCIUM: 8.5 mg/dL — AB (ref 8.6–10.3)
CHLORIDE: 109 mmol/L (ref 98–110)
CO2: 22 mmol/L (ref 20–31)
CREATININE: 1.01 mg/dL (ref 0.70–1.18)
Glucose, Bld: 80 mg/dL (ref 65–99)
Potassium: 4.1 mmol/L (ref 3.5–5.3)
Sodium: 142 mmol/L (ref 135–146)

## 2016-05-11 LAB — TSH: TSH: 0.84 m[IU]/L (ref 0.40–4.50)

## 2016-05-11 NOTE — Telephone Encounter (Signed)
LMOVM reminding pt to send remote transmission.   

## 2016-05-12 ENCOUNTER — Encounter: Payer: Self-pay | Admitting: Cardiology

## 2016-05-17 NOTE — Progress Notes (Signed)
Electrophysiology Office Note Date: 05/18/2016  ID:  [name] [name], DOB 12/31/1940, MRN [id number]  PCP: Gilford Rile, MD Primary Cardiologist: Stanford Breed Electrophysiologist: [name]  CC: discuss Watchman  [name] [name] is a 75 y.o. male seen today for Dr Rayann Heman.  He presents today for follow up of atrial fibrillation and Watchman discussion.  Since last being seen in clinic, the patient reports doing reasonably well.  His breathing has been stable. He was taking Lasix as needed instead of daily, we discussed importance of taking daily today.  He denies chest pain, palpitations, dyspnea, PND, orthopnea, nausea, vomiting, dizziness, syncope, edema, or early satiety.  He has not had ICD shocks. He has had no further GI bleeding that he is aware of.  Device History: STJ CRTD implanted 2013 for ICM, chronic systolic heart failure History of appropriate therapy: No History of AAD therapy: Yes - amiodarone for AF    Past Medical History  Diagnosis Date  . PAF (paroxysmal atrial fibrillation) (HCC)     a. on amio and pradaxa.   [name] CAD (coronary artery disease)     a. s/p CABG;  cath 10/07:  LM ok, LAD occluded, AV CFX 95%, pRCA occluded; L-LAD, S-OM2/OM3, S-PDA ok  . Cerebrovascular disease     a. s/p prior Left CEA;  followed by Dr. Donnetta Hutching  . COPD (chronic obstructive pulmonary disease) ([place])   . Hyperlipidemia   . Hypertension   . Nephrolithiasis   . Ischemic cardiomyopathy     a. EF 25% by last myoview in 11/2015. s/p CRT-D  . Iliac artery aneurysm, left (HCC)     followed by Dr. Donnetta Hutching  . Aortic dissection (HCC)     H/O focal aortic dissection  . LBBB (left bundle branch block)   . CKD (chronic kidney disease)   . Stroke [facility])     a. on coumadin --> switched to pradaxa  . CVA (cerebral infarction)   . DVT (deep venous thrombosis) ([place])   . AICD (automatic cardioverter/defibrillator) present    Past Surgical History  Procedure Laterality Date  . Uteroscopy    .  Coronary artery bypass graft  1998  . Appendectomy  09/11/11  . Biventricular defibrillator implantation  08/22/12    SJM Quadra Assura BiV ICD implanted by Dr Rayann Heman  . Spine surgery  Sep 27, 2014    Lower Back L 4  and   L 5  . Bi-ventricular implantable cardioverter defibrillator N/A 08/22/2012    Procedure: BI-VENTRICULAR IMPLANTABLE CARDIOVERTER DEFIBRILLATOR  (CRT-D);  Surgeon: Thompson Grayer, MD;  Location: [facility] CATH LAB;  Service: Cardiovascular;  Laterality: N/A;  . Carotid endarterectomy  July  2005    left  . Cardiac catheterization  2013  . Abdominal aortic aneurysm repair N/A 01/03/2016    Procedure: ANEURYSM ABDOMINAL AORTA, BILATERAL COMMON ILIAC REPAIR;  Surgeon: Rosetta Posner, MD;  Location: [facility] OR;  Service: Vascular;  Laterality: N/A;  . Esophagogastroduodenoscopy N/A 02/28/2016    Procedure: ESOPHAGOGASTRODUODENOSCOPY (EGD);  Surgeon: Ladene Artist, MD;  Location: [facility] ENDOSCOPY;  Service: Endoscopy;  Laterality: N/A;    Current Outpatient Prescriptions  Medication Sig Dispense Refill  . albuterol (PROVENTIL HFA;VENTOLIN HFA) 108 (90 BASE) MCG/ACT inhaler Inhale 2 puffs into the lungs every 4 (four) hours as needed for wheezing. 1 Inhaler 0  . amiodarone (PACERONE) 200 MG tablet Take 200 mg by mouth daily.    [name] amLODipine (NORVASC) 10 MG tablet Take 10 mg by mouth daily.    [name]  citalopram (CELEXA) 10 MG tablet Take 10 mg by mouth daily.    . dabigatran (PRADAXA) 150 MG CAPS capsule Take 1 capsule (150 mg total) by mouth 2 (two) times daily. 180 capsule 3  . fluconazole (DIFLUCAN) 100 MG tablet Take 1 tablet (100 mg total) by mouth daily. 7 tablet 0  . furosemide (LASIX) 20 MG tablet Take 3 tablets (60 mg total) by mouth daily. 90 tablet 11  . ipratropium-albuterol (DUONEB) 0.5-2.5 (3) MG/3ML SOLN Take 3 mLs by nebulization every 4 (four) hours as needed (for shortness of breath). 360 mL 5  . lisinopril (PRINIVIL,ZESTRIL) 10 MG tablet Take 1 tablet (10 mg total) by mouth daily. 90  tablet 3  . metoprolol succinate (TOPROL-XL) 25 MG 24 hr tablet Take 1 tablet (25 mg total) by mouth daily. Please schedule appointment for refills. 90 tablet 0  . Multiple Vitamin (MULITIVITAMIN WITH MINERALS) TABS Take 1 tablet by mouth daily.    . niacin (NIASPAN) 500 MG CR tablet Take 1,000 mg by mouth 2 (two) times daily.     . nitroGLYCERIN (NITROSTAT) 0.4 MG SL tablet Place 1 tablet (0.4 mg total) under the tongue every 5 (five) minutes as needed for chest pain. 100 tablet 3  . pantoprazole (PROTONIX) 40 MG tablet Take 1 tablet (40 mg total) by mouth 2 (two) times daily. 60 tablet 0  . pravastatin (PRAVACHOL) 80 MG tablet Take 1 tablet (80 mg total) by mouth daily. 90 tablet 2  . tiotropium (SPIRIVA HANDIHALER) 18 MCG inhalation capsule Place 1 capsule (18 mcg total) into inhaler and inhale daily. 30 capsule 2  . potassium chloride SA (K-DUR,KLOR-CON) 20 MEQ tablet Take 4 tablets by mouth 2 (two) times daily.     No current facility-administered medications for this visit.    Allergies:   Buprenorphine hcl and Morphine and related   Social History: Social History   Social History  . Marital Status: Married    Spouse Name: N/A  . Number of Children: 3  . Years of Education: 10 [place]   Occupational History  . RETIRED---truck driver    Social History Main Topics  . Smoking status: Former Smoker -- 1.50 packs/day for 57 years    Types: Cigarettes    Quit date: 12/11/2004  . Smokeless tobacco: Never Used  . Alcohol Use: No     Comment: 6 yrs ago   . Drug Use: No  . Sexual Activity: No   Other Topics Concern  . Not on file   Social History Narrative   Patient is married with 3 children.   Patient is right handed.   Patient has 10 th grade education.   Patient drinks tea occasionally.    Family History: Family History  Problem Relation Age of Onset  . Stroke Maternal Uncle   . Heart attack Neg Hx   . Hypertension Neg Hx   . Stroke Maternal Uncle     Review of  Systems: All other systems reviewed and are otherwise negative except as noted above.   Physical Exam: VS:  BP 136/80 mmHg  Pulse 72  Ht 6' 1" (1.854 m)  Wt 192 lb 3.2 oz (87.181 kg)  BMI 25.36 kg/m2  SpO2 95% , BMI Body mass index is 25.36 kg/(m^2).  GEN- The patient is elderly appearing, alert and oriented x 3 today.   HEENT: normocephalic, atraumatic; sclera clear, conjunctiva pink; hearing intact; oropharynx clear; neck supple  Lungs- Clear to ausculation bilaterally, normal work of breathing.  No  wheezes, rales, rhonchi Heart- Irregular rate and rhythm, no murmurs, rubs or gallops  GI- soft, non-tender, non-distended, bowel sounds present  Extremities- no clubbing, cyanosis, or edema; DP/PT/radial pulses 2+ bilaterally MS- no significant deformity or atrophy Skin- warm and dry, no rash or lesion; ICD pocket well healed Psych- euthymic mood, full affect Neuro- strength and sensation are intact  ICD interrogation- reviewed in detail today,  See PACEART report  EKG:  EKG is not ordered today.  Recent Labs: 03/13/2016: Hemoglobin 11.2*; Platelets 163 03/15/2016: Magnesium 1.7 03/28/2016: Brain Natriuretic Peptide 1794.7* 05/10/2016: ALT 9; BUN 17; Creat 1.01; Potassium 4.1; Sodium 142; TSH 0.84   Wt Readings from Last 3 Encounters:  05/18/16 192 lb 3.2 oz (87.181 kg)  05/10/16 189 lb (85.73 kg)  05/04/16 184 lb 12.8 oz (83.825 kg)     Other studies Reviewed: Additional studies/ records that were reviewed today include: Dr Jackalyn Lombard office notes, hospital records  Assessment and Plan:  1.  Permanent atrial fibrillation Asymptomatic but ventricular rates are limiting CRT pacing - will increase amiodarone to 222m twice daily and anticipate cardioversion at time of watchman or at 6 week TEE. He reports compliance with Pradaxa for at least the last 4 weeks.  Recent LFT's, TSH normal. With prior GI bleed, he is not felt to be a candidate for long term anticoagulation.  He has  discussed Watchman with Dr ARayann Hemanpreviously. TEE unable to be performed 03/2016 2/2 esophageal stricture and cadida esophagitis.  He has undergone treatment and seen back by Dr CStanford Breedwho felt he could undergo repeat TEE attempt.  This patients CHA2DS2-VASc Score and unadjusted Ischemic Stroke Rate (% per year) is equal to 11.2 % stroke rate/year from a score of 7 which necessitates long term oral anticoagulation to prevent stroke. HasBled score is 4.  Modified Rankin Score is 0.  Procedural risks for the Watchman implant have been reviewed with the patient including a 1% risk of stroke, 2% risk of perforation, 0.1% risk of device embolization.   Will schedule TEE and Watchman implant for 06/01/16.  The patient is aware that Watchman placement is dependent on TEE findings. Discussed with patient possibility of DCCV at time of Watchman implant depending on findings.   2. Chronic systolic dysfunction Slightly volume overloaded today and weight is up CorVue elevated last 11 days  Stable on an appropriate medical regimen Normal ICD function with decreased CRT pacing as above. Will follow for now Discussed importance of daily Lasix (was taking as needed) See Pace Art report No changes today  3.  HTN Stable No change required today  Current medicines are reviewed at length with the patient today.   The patient does not have concerns regarding his medicines.  The following changes were made today:  none  Labs/ tests ordered today include: pre-procedure labs  Orders Placed This Encounter  Procedures  . Basic Metabolic Panel (BMET)  . Comp Met (CMET)    Disposition:   Follow up after Watchman  Signed, AChanetta Marshall NP 05/18/2016 8:11 AM  [place] [phone](office) [phone](fax)

## 2016-05-18 ENCOUNTER — Other Ambulatory Visit: Payer: Self-pay | Admitting: *Deleted

## 2016-05-18 ENCOUNTER — Encounter: Payer: Self-pay | Admitting: *Deleted

## 2016-05-18 ENCOUNTER — Ambulatory Visit (INDEPENDENT_AMBULATORY_CARE_PROVIDER_SITE_OTHER): Payer: Medicare Other | Admitting: Nurse Practitioner

## 2016-05-18 ENCOUNTER — Encounter: Payer: Self-pay | Admitting: Internal Medicine

## 2016-05-18 ENCOUNTER — Encounter: Payer: Medicare Other | Admitting: Nurse Practitioner

## 2016-05-18 VITALS — BP 136/80 | HR 72 | Ht 73.0 in | Wt 192.2 lb

## 2016-05-18 DIAGNOSIS — I5022 Chronic systolic (congestive) heart failure: Secondary | ICD-10-CM | POA: Diagnosis not present

## 2016-05-18 DIAGNOSIS — I4821 Permanent atrial fibrillation: Secondary | ICD-10-CM

## 2016-05-18 DIAGNOSIS — I10 Essential (primary) hypertension: Secondary | ICD-10-CM | POA: Diagnosis not present

## 2016-05-18 DIAGNOSIS — I482 Chronic atrial fibrillation: Secondary | ICD-10-CM

## 2016-05-18 DIAGNOSIS — I255 Ischemic cardiomyopathy: Secondary | ICD-10-CM

## 2016-05-18 DIAGNOSIS — I5023 Acute on chronic systolic (congestive) heart failure: Secondary | ICD-10-CM

## 2016-05-18 LAB — BASIC METABOLIC PANEL
BUN: 14 mg/dL (ref 7–25)
CALCIUM: 8.2 mg/dL — AB (ref 8.6–10.3)
CHLORIDE: 109 mmol/L (ref 98–110)
CO2: 25 mmol/L (ref 20–31)
CREATININE: 1.07 mg/dL (ref 0.70–1.18)
Glucose, Bld: 68 mg/dL (ref 65–99)
Potassium: 3.6 mmol/L (ref 3.5–5.3)
Sodium: 144 mmol/L (ref 135–146)

## 2016-05-18 LAB — CUP PACEART INCLINIC DEVICE CHECK
Date Time Interrogation Session: 20170608115737
HIGH POWER IMPEDANCE MEASURED VALUE: 57.375
Implantable Lead Implant Date: 20130912
Implantable Lead Location: 753859
Implantable Lead Location: 753860
Lead Channel Impedance Value: 350 Ohm
Lead Channel Impedance Value: 450 Ohm
Lead Channel Impedance Value: 462.5 Ohm
Lead Channel Pacing Threshold Amplitude: 0.5 V
Lead Channel Pacing Threshold Amplitude: 1 V
Lead Channel Pacing Threshold Amplitude: 1 V
Lead Channel Pacing Threshold Pulse Width: 0.5 ms
Lead Channel Sensing Intrinsic Amplitude: 1.2 mV
Lead Channel Setting Pacing Amplitude: 2 V
Lead Channel Setting Pacing Pulse Width: 0.5 ms
Lead Channel Setting Sensing Sensitivity: 0.5 mV
MDC IDC LEAD IMPLANT DT: 20130912
MDC IDC LEAD IMPLANT DT: 20130912
MDC IDC LEAD LOCATION: 753858
MDC IDC MSMT BATTERY REMAINING LONGEVITY: 49.2
MDC IDC MSMT LEADCHNL LV PACING THRESHOLD PULSEWIDTH: 0.5 ms
MDC IDC MSMT LEADCHNL RV PACING THRESHOLD AMPLITUDE: 0.5 V
MDC IDC MSMT LEADCHNL RV PACING THRESHOLD PULSEWIDTH: 0.5 ms
MDC IDC MSMT LEADCHNL RV PACING THRESHOLD PULSEWIDTH: 0.5 ms
MDC IDC MSMT LEADCHNL RV SENSING INTR AMPL: 11.7 mV
MDC IDC SET LEADCHNL RA PACING AMPLITUDE: 1.875
MDC IDC SET LEADCHNL RV PACING AMPLITUDE: 2 V
MDC IDC SET LEADCHNL RV PACING PULSEWIDTH: 0.5 ms
MDC IDC STAT BRADY RA PERCENT PACED: 0.09 %
MDC IDC STAT BRADY RV PERCENT PACED: 29 %
Pulse Gen Serial Number: 1075181

## 2016-05-18 LAB — CBC WITH DIFFERENTIAL/PLATELET
Basophils Absolute: 67 cells/uL (ref 0–200)
Basophils Relative: 1 %
EOS PCT: 2 %
Eosinophils Absolute: 134 cells/uL (ref 15–500)
HEMATOCRIT: 42.4 % (ref 38.5–50.0)
HEMOGLOBIN: 13.6 g/dL (ref 13.2–17.1)
Lymphocytes Relative: 29 %
Lymphs Abs: 1943 cells/uL (ref 850–3900)
MCH: 27.1 pg (ref 27.0–33.0)
MCHC: 32.1 g/dL (ref 32.0–36.0)
MCV: 84.5 fL (ref 80.0–100.0)
MPV: 9.4 fL (ref 7.5–12.5)
Monocytes Absolute: 603 cells/uL (ref 200–950)
Monocytes Relative: 9 %
NEUTROS ABS: 3953 {cells}/uL (ref 1500–7800)
Neutrophils Relative %: 59 %
Platelets: 191 10*3/uL (ref 140–400)
RBC: 5.02 MIL/uL (ref 4.20–5.80)
RDW: 20.9 % — ABNORMAL HIGH (ref 11.0–15.0)
WBC: 6.7 10*3/uL (ref 3.8–10.8)

## 2016-05-18 MED ORDER — FUROSEMIDE 20 MG PO TABS: 60.0000 mg | ORAL_TABLET | Freq: Every day | ORAL | Status: DC

## 2016-05-18 MED ORDER — AMIODARONE HCL 200 MG PO TABS: 200.0000 mg | ORAL_TABLET | Freq: Two times a day (BID) | ORAL | Status: DC

## 2016-05-18 NOTE — Patient Instructions (Addendum)
Medication Instructions:   START TAKING LASIX 60 MG DAILY  START TAKING AMIODARONE 200 MG  TWICE A DAY   If you need a refill on your cardiac medications before your next appointment, please call your pharmacy.  Labwork: CBC AND BMET    Testing/Procedures:  SEE LETTER FOR WATCH MAN PROCEDURE    Follow-Up: WILL BE DETERMINED AFTER THE PROCEDURE   Any Other Special Instructions Will Be Listed Below (If Applicable).

## 2016-05-22 ENCOUNTER — Telehealth: Payer: Self-pay | Admitting: *Deleted

## 2016-05-22 NOTE — Telephone Encounter (Signed)
-----   Message from Marily Lente, NP sent at 05/18/2016  5:26 PM EDT ----- Please notify patient of stable labs

## 2016-05-31 NOTE — Anesthesia Preprocedure Evaluation (Addendum)
Anesthesia Evaluation  Patient identified by MRN, date of birth, ID band Patient awake    Reviewed: Allergy & Precautions, NPO status , Patient's Chart, lab work & pertinent test results  History of Anesthesia Complications Negative for: history of anesthetic complications  Airway Mallampati: I  TM Distance: >3 FB Neck ROM: Full    Dental  (+) Edentulous Upper, Edentulous Lower, Dental Advisory Given   Pulmonary COPD, former smoker,    Pulmonary exam normal        Cardiovascular hypertension, + CAD, + Peripheral Vascular Disease and +CHF  + Cardiac Defibrillator  Rhythm:Irregular Rate:Normal     Neuro/Psych PSYCHIATRIC DISORDERS Depression CVA    GI/Hepatic negative GI ROS,   Endo/Other  negative endocrine ROS  Renal/GU Renal InsufficiencyRenal disease     Musculoskeletal   Abdominal   Peds  Hematology negative hematology ROS (+)   Anesthesia Other Findings   Reproductive/Obstetrics                            Anesthesia Physical Anesthesia Plan  ASA: III  Anesthesia Plan: General   Post-op Pain Management:    Induction: Intravenous  Airway Management Planned: Oral ETT  Additional Equipment: Arterial line  Intra-op Plan:   Post-operative Plan: Extubation in OR  Informed Consent: I have reviewed the patients History and Physical, chart, labs and discussed the procedure including the risks, benefits and alternatives for the proposed anesthesia with the patient or authorized representative who has indicated his/her understanding and acceptance.   Dental advisory given  Plan Discussed with: CRNA and Anesthesiologist  Anesthesia Plan Comments:        Anesthesia Quick Evaluation

## 2016-06-01 ENCOUNTER — Encounter (HOSPITAL_COMMUNITY)
Admission: RE | Disposition: A | Discharge status: Home / Self Care | Payer: Self-pay | Source: Ambulatory Visit | Attending: Internal Medicine

## 2016-06-01 ENCOUNTER — Ambulatory Visit (HOSPITAL_COMMUNITY): Payer: Medicare Other

## 2016-06-01 ENCOUNTER — Ambulatory Visit (HOSPITAL_COMMUNITY): Payer: Medicare Other | Admitting: Anesthesiology

## 2016-06-01 ENCOUNTER — Inpatient Hospital Stay (HOSPITAL_COMMUNITY)
Admission: RE | Admit: 2016-06-01 | Discharge: 2016-06-02 | DRG: 274 | Disposition: A | Discharge status: Home / Self Care | Payer: Medicare Other | Source: Ambulatory Visit | Attending: Internal Medicine | Admitting: Internal Medicine

## 2016-06-01 ENCOUNTER — Encounter (HOSPITAL_COMMUNITY): Payer: Self-pay | Admitting: Certified Registered"

## 2016-06-01 DIAGNOSIS — Z8719 Personal history of other diseases of the digestive system: Secondary | ICD-10-CM

## 2016-06-01 DIAGNOSIS — Z8673 Personal history of transient ischemic attack (TIA), and cerebral infarction without residual deficits: Secondary | ICD-10-CM

## 2016-06-01 DIAGNOSIS — J449 Chronic obstructive pulmonary disease, unspecified: Secondary | ICD-10-CM | POA: Diagnosis present

## 2016-06-01 DIAGNOSIS — Z006 Encounter for examination for normal comparison and control in clinical research program: Secondary | ICD-10-CM

## 2016-06-01 DIAGNOSIS — I130 Hypertensive heart and chronic kidney disease with heart failure and stage 1 through stage 4 chronic kidney disease, or unspecified chronic kidney disease: Secondary | ICD-10-CM | POA: Diagnosis present

## 2016-06-01 DIAGNOSIS — Y929 Unspecified place or not applicable: Secondary | ICD-10-CM

## 2016-06-01 DIAGNOSIS — I481 Persistent atrial fibrillation: Secondary | ICD-10-CM

## 2016-06-01 DIAGNOSIS — I5022 Chronic systolic (congestive) heart failure: Secondary | ICD-10-CM | POA: Diagnosis present

## 2016-06-01 DIAGNOSIS — I482 Chronic atrial fibrillation: Secondary | ICD-10-CM | POA: Diagnosis not present

## 2016-06-01 DIAGNOSIS — I255 Ischemic cardiomyopathy: Secondary | ICD-10-CM | POA: Diagnosis present

## 2016-06-01 DIAGNOSIS — I480 Paroxysmal atrial fibrillation: Secondary | ICD-10-CM | POA: Diagnosis not present

## 2016-06-01 DIAGNOSIS — N189 Chronic kidney disease, unspecified: Secondary | ICD-10-CM | POA: Diagnosis present

## 2016-06-01 DIAGNOSIS — Z7901 Long term (current) use of anticoagulants: Secondary | ICD-10-CM | POA: Diagnosis not present

## 2016-06-01 DIAGNOSIS — I2510 Atherosclerotic heart disease of native coronary artery without angina pectoris: Secondary | ICD-10-CM | POA: Diagnosis present

## 2016-06-01 DIAGNOSIS — Z86718 Personal history of other venous thrombosis and embolism: Secondary | ICD-10-CM

## 2016-06-01 DIAGNOSIS — E785 Hyperlipidemia, unspecified: Secondary | ICD-10-CM | POA: Diagnosis present

## 2016-06-01 DIAGNOSIS — Z9114 Patient's other noncompliance with medication regimen: Secondary | ICD-10-CM | POA: Diagnosis not present

## 2016-06-01 DIAGNOSIS — Z87891 Personal history of nicotine dependence: Secondary | ICD-10-CM | POA: Diagnosis not present

## 2016-06-01 DIAGNOSIS — Z951 Presence of aortocoronary bypass graft: Secondary | ICD-10-CM

## 2016-06-01 DIAGNOSIS — Z95810 Presence of automatic (implantable) cardiac defibrillator: Secondary | ICD-10-CM

## 2016-06-01 DIAGNOSIS — T45516A Underdosing of anticoagulants, initial encounter: Secondary | ICD-10-CM | POA: Diagnosis not present

## 2016-06-01 DIAGNOSIS — I723 Aneurysm of iliac artery: Secondary | ICD-10-CM | POA: Diagnosis present

## 2016-06-01 DIAGNOSIS — I4819 Other persistent atrial fibrillation: Secondary | ICD-10-CM

## 2016-06-01 HISTORY — DX: Personal history of other diseases of the digestive system: Z87.19

## 2016-06-01 HISTORY — PX: LEFT ATRIAL APPENDAGE OCCLUSION: SHX173A

## 2016-06-01 HISTORY — DX: Major depressive disorder, single episode, unspecified: F32.9

## 2016-06-01 HISTORY — DX: Personal history of peptic ulcer disease: Z87.11

## 2016-06-01 HISTORY — DX: Depression, unspecified: F32.A

## 2016-06-01 LAB — POCT ACTIVATED CLOTTING TIME
ACTIVATED CLOTTING TIME: 213 s
Activated Clotting Time: 252 seconds
Activated Clotting Time: 191 seconds

## 2016-06-01 LAB — TYPE AND SCREEN
ABO/RH(D): O POS
Antibody Screen: NEGATIVE

## 2016-06-01 LAB — MRSA PCR SCREENING: MRSA by PCR: POSITIVE — AB

## 2016-06-01 SURGERY — LEFT ATRIAL APPENDAGE OCCLUSION
Anesthesia: General

## 2016-06-01 MED ORDER — FLUCONAZOLE 100 MG PO TABS
100.0000 mg | ORAL_TABLET | Freq: Every day | ORAL | Status: DC
  Administered 2016-06-02: 11:00:00 100 mg via ORAL
  Filled 2016-06-01 (×2): qty 1

## 2016-06-01 MED ORDER — LISINOPRIL 10 MG PO TABS
10.0000 mg | ORAL_TABLET | Freq: Every day | ORAL | Status: DC
  Administered 2016-06-01 – 2016-06-02 (×2): 10 mg via ORAL
  Filled 2016-06-01 (×2): qty 1

## 2016-06-01 MED ORDER — LIDOCAINE HCL (PF) 1 % IJ SOLN
INTRAMUSCULAR | Status: DC | PRN
  Administered 2016-06-01: 30 mL

## 2016-06-01 MED ORDER — ONDANSETRON HCL 4 MG/2ML IJ SOLN
INTRAMUSCULAR | Status: DC | PRN
  Administered 2016-06-01: 4 mg via INTRAVENOUS

## 2016-06-01 MED ORDER — FUROSEMIDE 40 MG PO TABS
60.0000 mg | ORAL_TABLET | Freq: Every day | ORAL | Status: DC
  Administered 2016-06-02: 60 mg via ORAL
  Filled 2016-06-01: qty 1

## 2016-06-01 MED ORDER — MIDAZOLAM HCL 2 MG/2ML IJ SOLN
INTRAMUSCULAR | Status: DC | PRN
  Administered 2016-06-01: 1 mg via INTRAVENOUS

## 2016-06-01 MED ORDER — SODIUM CHLORIDE 0.9% FLUSH: 3.0000 mL | INTRAVENOUS | Status: DC | PRN

## 2016-06-01 MED ORDER — SUGAMMADEX SODIUM 200 MG/2ML IV SOLN
INTRAVENOUS | Status: DC | PRN
  Administered 2016-06-01: 180 mg via INTRAVENOUS

## 2016-06-01 MED ORDER — PHENYLEPHRINE HCL 10 MG/ML IJ SOLN
10.0000 mg | INTRAVENOUS | Status: DC | PRN
  Administered 2016-06-01: 10 ug/min via INTRAVENOUS

## 2016-06-01 MED ORDER — LIDOCAINE HCL (CARDIAC) 20 MG/ML IV SOLN
INTRAVENOUS | Status: DC | PRN
Start: 2016-06-01 — End: 2016-06-01
  Administered 2016-06-01: 60 mg via INTRAVENOUS

## 2016-06-01 MED ORDER — LIVING BETTER WITH HEART FAILURE BOOK
Freq: Once | Status: AC
  Administered 2016-06-01: 21:00:00

## 2016-06-01 MED ORDER — CITALOPRAM HYDROBROMIDE 20 MG PO TABS
10.0000 mg | ORAL_TABLET | Freq: Every day | ORAL | Status: DC
  Administered 2016-06-02: 10 mg via ORAL
  Filled 2016-06-01 (×2): qty 1

## 2016-06-01 MED ORDER — POTASSIUM CHLORIDE CRYS ER 20 MEQ PO TBCR: 1600.0000 meq | EXTENDED_RELEASE_TABLET | Freq: Two times a day (BID) | ORAL | Status: DC

## 2016-06-01 MED ORDER — HEPARIN (PORCINE) IN NACL 2-0.9 UNIT/ML-% IJ SOLN
INTRAMUSCULAR | Status: AC
  Filled 2016-06-01: qty 500

## 2016-06-01 MED ORDER — LACTATED RINGERS IV SOLN
INTRAVENOUS | Status: DC | PRN
  Administered 2016-06-01: 07:00:00 via INTRAVENOUS

## 2016-06-01 MED ORDER — HEPARIN SODIUM (PORCINE) 1000 UNIT/ML IJ SOLN
INTRAMUSCULAR | Status: DC | PRN
  Administered 2016-06-01: 12000 [IU] via INTRAVENOUS

## 2016-06-01 MED ORDER — CEFAZOLIN SODIUM-DEXTROSE 2-4 GM/100ML-% IV SOLN
2.0000 g | INTRAVENOUS | Status: AC
  Administered 2016-06-01: 2 g via INTRAVENOUS

## 2016-06-01 MED ORDER — ALBUTEROL SULFATE (2.5 MG/3ML) 0.083% IN NEBU
3.0000 mL | INHALATION_SOLUTION | RESPIRATORY_TRACT | Status: DC | PRN
Start: 2016-06-01 — End: 2016-06-02
  Administered 2016-06-01: 3 mL via RESPIRATORY_TRACT
  Filled 2016-06-01: qty 3

## 2016-06-01 MED ORDER — CEFAZOLIN SODIUM-DEXTROSE 2-4 GM/100ML-% IV SOLN
INTRAVENOUS | Status: AC
  Filled 2016-06-01: qty 100

## 2016-06-01 MED ORDER — PRAVASTATIN SODIUM 40 MG PO TABS
80.0000 mg | ORAL_TABLET | Freq: Every day | ORAL | Status: DC
  Administered 2016-06-01: 16:00:00 80 mg via ORAL
  Filled 2016-06-01: qty 2

## 2016-06-01 MED ORDER — ACETAMINOPHEN 325 MG PO TABS: 650.0000 mg | ORAL_TABLET | ORAL | Status: DC | PRN

## 2016-06-01 MED ORDER — IOPAMIDOL (ISOVUE-370) INJECTION 76%
INTRAVENOUS | Status: AC
  Filled 2016-06-01: qty 100

## 2016-06-01 MED ORDER — SODIUM CHLORIDE 0.9 % IV SOLN: INTRAVENOUS | Status: DC

## 2016-06-01 MED ORDER — METOPROLOL SUCCINATE ER 25 MG PO TB24
25.0000 mg | ORAL_TABLET | Freq: Every day | ORAL | Status: DC
  Administered 2016-06-01 – 2016-06-02 (×2): 25 mg via ORAL
  Filled 2016-06-01 (×2): qty 1

## 2016-06-01 MED ORDER — HEPARIN (PORCINE) IN NACL 2-0.9 UNIT/ML-% IJ SOLN
INTRAMUSCULAR | Status: DC | PRN
  Administered 2016-06-01: 08:00:00

## 2016-06-01 MED ORDER — HEPARIN SODIUM (PORCINE) 1000 UNIT/ML IJ SOLN
INTRAMUSCULAR | Status: AC
  Filled 2016-06-01: qty 1

## 2016-06-01 MED ORDER — SODIUM CHLORIDE 0.9% FLUSH
3.0000 mL | Freq: Two times a day (BID) | INTRAVENOUS | Status: DC
  Administered 2016-06-01: 3 mL via INTRAVENOUS

## 2016-06-01 MED ORDER — DABIGATRAN ETEXILATE MESYLATE 150 MG PO CAPS
150.0000 mg | ORAL_CAPSULE | Freq: Two times a day (BID) | ORAL | Status: DC
  Filled 2016-06-01: qty 1

## 2016-06-01 MED ORDER — ONDANSETRON HCL 4 MG/2ML IJ SOLN: 4.0000 mg | Freq: Four times a day (QID) | INTRAMUSCULAR | Status: DC | PRN

## 2016-06-01 MED ORDER — HEPARIN SODIUM (PORCINE) 1000 UNIT/ML IJ SOLN
INTRAMUSCULAR | Status: DC | PRN
  Administered 2016-06-01: 2000 [IU] via INTRAVENOUS

## 2016-06-01 MED ORDER — ENSURE ENLIVE PO LIQD
237.0000 mL | Freq: Two times a day (BID) | ORAL | Status: DC
  Filled 2016-06-01 (×5): qty 237

## 2016-06-01 MED ORDER — TIOTROPIUM BROMIDE MONOHYDRATE 18 MCG IN CAPS
18.0000 ug | ORAL_CAPSULE | Freq: Every day | RESPIRATORY_TRACT | Status: DC
  Administered 2016-06-02: 09:00:00 18 ug via RESPIRATORY_TRACT
  Filled 2016-06-01: qty 5

## 2016-06-01 MED ORDER — SODIUM CHLORIDE 0.9 % IV SOLN: 250.0000 mL | INTRAVENOUS | Status: DC | PRN

## 2016-06-01 MED ORDER — MUPIROCIN 2 % EX OINT
1.0000 "application " | TOPICAL_OINTMENT | Freq: Two times a day (BID) | CUTANEOUS | Status: DC
  Administered 2016-06-01 – 2016-06-02 (×2): 1 via NASAL
  Filled 2016-06-01: qty 22

## 2016-06-01 MED ORDER — OFF THE BEAT BOOK
Freq: Once | Status: AC
  Administered 2016-06-01: 21:00:00
  Filled 2016-06-01: qty 1

## 2016-06-01 MED ORDER — IOPAMIDOL (ISOVUE-370) INJECTION 76%
INTRAVENOUS | Status: DC | PRN
  Administered 2016-06-01: 35 mL

## 2016-06-01 MED ORDER — PANTOPRAZOLE SODIUM 40 MG PO TBEC
40.0000 mg | DELAYED_RELEASE_TABLET | Freq: Two times a day (BID) | ORAL | Status: DC
Start: 2016-06-01 — End: 2016-06-02
  Administered 2016-06-01 – 2016-06-02 (×3): 40 mg via ORAL
  Filled 2016-06-01 (×3): qty 1

## 2016-06-01 MED ORDER — AMIODARONE HCL 200 MG PO TABS
200.0000 mg | ORAL_TABLET | Freq: Two times a day (BID) | ORAL | Status: DC
  Administered 2016-06-01 – 2016-06-02 (×3): 200 mg via ORAL
  Filled 2016-06-01 (×3): qty 1

## 2016-06-01 MED ORDER — CHLORHEXIDINE GLUCONATE CLOTH 2 % EX PADS
6.0000 | MEDICATED_PAD | Freq: Every day | CUTANEOUS | Status: DC
  Administered 2016-06-01: 22:00:00 6 via TOPICAL

## 2016-06-01 MED ORDER — AMLODIPINE BESYLATE 10 MG PO TABS
10.0000 mg | ORAL_TABLET | Freq: Every day | ORAL | Status: DC
  Administered 2016-06-01 – 2016-06-02 (×2): 10 mg via ORAL
  Filled 2016-06-01 (×2): qty 1

## 2016-06-01 MED ORDER — DABIGATRAN ETEXILATE MESYLATE 150 MG PO CAPS
150.0000 mg | ORAL_CAPSULE | Freq: Two times a day (BID) | ORAL | Status: DC
  Administered 2016-06-01 – 2016-06-02 (×2): 150 mg via ORAL
  Filled 2016-06-01 (×2): qty 1

## 2016-06-01 MED ORDER — PROPOFOL 10 MG/ML IV BOLUS
INTRAVENOUS | Status: DC | PRN
  Administered 2016-06-01: 120 mg via INTRAVENOUS

## 2016-06-01 MED ORDER — ROCURONIUM BROMIDE 100 MG/10ML IV SOLN
INTRAVENOUS | Status: DC | PRN
  Administered 2016-06-01: 50 mg via INTRAVENOUS
  Administered 2016-06-01: 10 mg via INTRAVENOUS

## 2016-06-01 MED ORDER — SODIUM CHLORIDE 0.45 % IV SOLN: INTRAVENOUS | Status: DC

## 2016-06-01 MED ORDER — FENTANYL CITRATE (PF) 250 MCG/5ML IJ SOLN
INTRAMUSCULAR | Status: DC | PRN
  Administered 2016-06-01: 50 ug via INTRAVENOUS

## 2016-06-01 MED ORDER — IPRATROPIUM-ALBUTEROL 0.5-2.5 (3) MG/3ML IN SOLN
3.0000 mL | RESPIRATORY_TRACT | Status: DC | PRN
  Filled 2016-06-01: qty 3

## 2016-06-01 SURGICAL SUPPLY — 16 items
BLANKET WARM UNDERBOD FULL ACC (MISCELLANEOUS) ×3 IMPLANT
CATH DIAG 6FR PIGTAIL (CATHETERS) ×2 IMPLANT
KIT HEART LEFT (KITS) ×2 IMPLANT
NDL TRANSEP BRK 71CM 407200 (NEEDLE) IMPLANT
NEEDLE TRANSEP BRK 71CM 407200 (NEEDLE) ×3 IMPLANT
PACK CARDIAC CATHETERIZATION (CUSTOM PROCEDURE TRAY) ×2 IMPLANT
PAD DEFIB LIFELINK (PAD) ×2 IMPLANT
SHEATH INTRO CHECKFLO 16F 13 (SHEATH) IMPLANT
SHEATH INTRO CHECKFLO 16F 13CM (SHEATH) ×2
SHEATH PINNACLE 8F 10CM (SHEATH) ×2 IMPLANT
SHEATH SWARTZ TS SL2 63CM 8.5F (SHEATH) ×2 IMPLANT
TRANSDUCER W/STOPCOCK (MISCELLANEOUS) ×3 IMPLANT
TUBING CIL FLEX 10 FLL-RA (TUBING) ×2 IMPLANT
WATCHMAN ACCESS DOUBLE CURVE (SHEATH) ×2 IMPLANT
WATCHMAN CLOSURE 30MM (Prosthesis & Implant Heart) ×3 IMPLANT
WIRE AMPLATZ WHISKJ .035X260CM (WIRE) ×2 IMPLANT

## 2016-06-01 NOTE — Progress Notes (Signed)
After meeting with the patient's girl friend and 2 daughters to tell them that his procedure went well, they stated that he has not been compliant with anticoagulation due to cost concerns over the past few weeks.  The patient had been very clear with me prior to the procedure today that he had not missed any doses of his anticoagulation and that he understood increased risks of stroke off of anticoagulation. I will have additional conversation with him about the importance of compliance after the procedure today and will also try to find samples to help him with costs in the short term.  [name] MD, [facility] 06/01/2016 12:43 PM

## 2016-06-01 NOTE — Progress Notes (Signed)
Sheath removed and pressure held to right groin 30 minutes by Joan Mayans [facility]IS. Groin level 0. Right distal pedal pulse palpable. No apparent complications. Post sheath instructions given and patient verbalizes understanding of these instructions.  Bedrest begins at 12:00pm

## 2016-06-01 NOTE — Progress Notes (Addendum)
Spent time with patient and family today to explain information about MRSA. Patient and family very concerned about positive PCR and need for precautions. Reviewed information regarding MRSA in the community, spread of MRSA, importance of consistent good hand washing in preventing the spread of MRSA and to use soap, water and friction and not totally depend on alcohol gel for cleaning hands. Reviewed information about over use of antibiotics in general and treatment of MRSA with mupirocin ointment for 5 days. Patient and family verbalized understanding and questions answered. One daughter was concerned about herself as she has had multiple infections recently and feels compromised, discussed her particular situation. Information reviewed with Fredric Dine from infection prevention and information obtained, shared with family. Claris Che unable to see patient today but will attempt to stop by tomorrow and be available for questions and clarification if needed.   Written information obtained from exit care, printed and given to family to review.

## 2016-06-01 NOTE — Discharge Instructions (Addendum)
Information on my medicine - Pradaxa® (dabigatran) ° °This medication education was reviewed with me or my healthcare representative as part of my discharge preparation.  The pharmacist that spoke with me during my hospital stay was:  Robertson, Crystal Stillinger, RPH ° °Why was Pradaxa® prescribed for you? °Pradaxa® was prescribed for you to reduce the risk of forming blood clots that cause a stroke if you have a medical condition called atrial fibrillation (a type of irregular heartbeat).   ° °What do you Need to know about PradAXa®? °Take your Pradaxa® TWICE DAILY - one capsule in the morning and one tablet in the evening with or without food.  It would be best to take the doses about the same time each day. ° °The capsules should not be broken, chewed or opened - they must be swallowed whole. ° °Do not store Pradaxa in other medication containers - once the bottle is opened the Pradaxa should be used within FOUR months; throw away any capsules that haven’t been by that time. ° °Take Pradaxa® exactly as prescribed by your doctor.  DO NOT stop taking Pradaxa® without talking to the doctor who prescribed the medication.  Stopping without other stroke prevention medication to take the place of Pradaxa may increase your risk of developing a clot that causes a stroke.  Refill your prescription before you run out. ° °After discharge, you should have regular check-up appointments with your healthcare provider that is prescribing your Pradaxa®.  In the future your dose may need to be changed if your kidney function or weight changes by a significant amount. ° °What do you do if you miss a dose? °If you miss a dose, take it as soon as you remember on the same day.  If your next dose is less than 6 hours away, skip the missed dose.  Do not take two doses of PRADAXA at the same time. ° °Important Safety Information °A possible side effect of Pradaxa® is bleeding. You should call your healthcare provider right away if you  experience any of the following: °? Bleeding from an injury or your nose that does not stop. °? Unusual colored urine (red or dark brown) or unusual colored stools (red or black). °? Unusual bruising for unknown reasons. °? A serious fall or if you hit your head (even if there is no bleeding). ° °Some medicines may interact with Pradaxa® and might increase your risk of bleeding or clotting while on Pradaxa®. To help avoid this, consult your healthcare provider or pharmacist prior to using any new prescription or non-prescription medications, including herbals, vitamins, non-steroidal anti-inflammatory drugs (NSAIDs) and supplements. ° °This website has more information on Pradaxa® (dabigatran): https://www.pradaxa.com ° ° ° ° °

## 2016-06-01 NOTE — Anesthesia Procedure Notes (Signed)
Procedure Name: Intubation Date/Time: 06/01/2016 7:51 AM Performed by: De Nurse Pre-anesthesia Checklist: Patient identified, Emergency Drugs available, Suction available, Patient being monitored and Timeout performed Patient Re-evaluated:Patient Re-evaluated prior to inductionOxygen Delivery Method: Circle system utilized and Ambu bag Preoxygenation: Pre-oxygenation with 100% oxygen Ventilation: Mask ventilation without difficulty and Two handed mask ventilation required Laryngoscope Size: Mac and 3 Grade View: Grade I Tube type: Oral Tube size: 7.5 mm Number of attempts: 1 Airway Equipment and Method: Stylet Placement Confirmation: ETT inserted through vocal cords under direct vision,  positive ETCO2 and breath sounds checked- equal and bilateral Secured at: 21 cm Tube secured with: Tape Dental Injury: Teeth and Oropharynx as per pre-operative assessment

## 2016-06-01 NOTE — Interval H&P Note (Signed)
History and Physical Interval Note:  06/01/2016 7:23 AM  [name] [name]  has presented today for surgery, with the diagnosis of afib  The various methods of treatment have been discussed with the patient and family. After consideration of risks, benefits and other options for treatment, the patient has consented to  Procedure(s): LEFT ATRIAL APPENDAGE OCCLUSION (N/A) as a surgical intervention .  The patient's history has been reviewed, patient examined, no change in status, stable for surgery.  I have reviewed the patient's chart and labs.  Questions were answered to the patient's satisfaction.    I have seen [name] [name] is a 74 y.o. male in the office today who has been referred by Dr [name] for a Watchman left atrial appendage closure device.  He has a history of persistent atrial fibrillation.  This patients CHA2DS2-VASc Score and unadjusted Ischemic Stroke Rate (% per year) is equal to 11.2 % stroke rate/year from a score of 7 which necessitates long term oral anticoagulation to prevent stroke.   Unfortunately, He is not felt to be a long term Warfarin candidate secondary to GI bleeding.  The patients chart has been reviewed and I along with their referring cardiologist feel that they would be a candidate for short term oral anticoagulation.  Procedural risks for the Watchman implant have been reviewed with the patient including a 1% risk of stroke, 2% risk of perforation, 0.1% risk of device embolization.  Risk of BiV ICD lead dislodgement also discussed.  Given the patient's poor candidacy for long-term oral anticoagulation, ability to tolerate short term oral anticoagulation, I have recommended the watchman left atrial appendage closure system.  He wishes to proceed at this time.  Will plan cardioversion at time of implant.  [name]

## 2016-06-01 NOTE — Transfer of Care (Signed)
Immediate Anesthesia Transfer of Care Note  Patient: [name] [name]  Procedure(s) Performed: Procedure(s): LEFT ATRIAL APPENDAGE OCCLUSION (N/A)  Patient Location: Cath Lab  Anesthesia Type:General  Level of Consciousness: lethargic and responds to stimulation  Airway & Oxygen Therapy: Patient Spontanous Breathing and Patient connected to nasal cannula oxygen  Post-op Assessment: Report given to RN  Post vital signs: Reviewed and stable  Last Vitals:  Filed Vitals:   06/01/16 0606  BP: 134/114  Pulse: 70  Temp: 36.4 C  Resp: 18    Last Pain: There were no vitals filed for this visit.       Complications: No apparent anesthesia complications

## 2016-06-01 NOTE — Anesthesia Postprocedure Evaluation (Signed)
Anesthesia Post Note  Patient: [name] [name]  Procedure(s) Performed: Procedure(s) (LRB): LEFT ATRIAL APPENDAGE OCCLUSION (N/A)  Patient location during evaluation: PACU Anesthesia Type: General Level of consciousness: sedated Pain management: pain level controlled Vital Signs Assessment: post-procedure vital signs reviewed and stable Respiratory status: spontaneous breathing and respiratory function stable Cardiovascular status: stable Anesthetic complications: no    Last Vitals:  Filed Vitals:   06/01/16 1055 06/01/16 1110  BP: 136/70 129/74  Pulse: 67 61  Temp:    Resp: 21 0    Last Pain: There were no vitals filed for this visit.               Quisha Mabie DANIEL

## 2016-06-01 NOTE — Progress Notes (Signed)
  Echocardiogram Echocardiogram Transesophageal has been performed.  [name] [name] 06/01/2016, 9:50 AM

## 2016-06-01 NOTE — Discharge Summary (Signed)
ELECTROPHYSIOLOGY PROCEDURE DISCHARGE SUMMARY    Patient ID: [name] [name],  MRN: [id number], DOB/AGE: May 24, 1941 75 y.o.  Admit date: 06/01/2016 Discharge date: 06/02/2016  Primary Care Physician: [name], MD Primary Cardiologist: [name] Electrophysiologist: [name], MD  Primary Discharge Diagnosis:  Persistent atrial fibrillation status post LAA occluder insertion this admission  Secondary Discharge Diagnosis:  1.  CAD s/p CABG 2.  Prior CVA 3.  HTN 4.  Ischemic cardiomyopathy 5.  Chronic systolic heart failure s/p STJ CRTD 6.  CKD  Procedures This Admission:  1.  Insertion of left atrial appendage occluder (Watchman) on 06/01/16 by Dr [name] and Dr Excell Seltzer.  This study demonstrated successful implantation of Watchman LAA occluder with no early apparent complications. TEE at time of procedure demonstrated no leak around device.   Brief HPI: [name] [name] is a 75 y.o. male with a history of persistent atrial fibrillation.  They are felt to not be a candidate for long term Warfarin due to prior GI bleed. Risks, benefits, and alternatives to Watchman implant were reviewed with the patient who wished to proceed.  The patient underwent TEE prior to the procedure which demonstrated appendage suitable for attempt at [facility] placement.    Hospital Course:  The patient was admitted and underwent Watchman insertion with details as outlined above.  They were monitored on telemetry overnight which demonstrated AV pacing.  Groin was without complication on the day of discharge.  The patient was examined and considered to be stable for discharge.  Bedside echo demonstrated stable device position without pericardial effusion.  Wound care and restrictions were reviewed with the patient.   This patients CHA2DS2-VASc Score and unadjusted Ischemic Stroke Rate (% per year) is equal to 11.2 % stroke rate/year from a score of 7 Above score calculated as 1 point each if present  [CHF, HTN, DM, Vascular=MI/PAD/Aortic Plaque, Age if 65-74, or Male] Above score calculated as 2 points each if present [Age > 75, or Stroke/TIA/TE]  The patient will be resumed on Pradaxa at discharge.  Compliance with post op OAC regimen was strongly reinforced with the patient today.  The patient will be seen by APP in 1 week for groin check, monitor for s/s of bleeding, CBC.  45 day TEE will also be scheduled.   Physical Exam: Filed Vitals:   06/01/16 2100 06/01/16 2132 06/01/16 2200 06/02/16 0416  BP: 115/90  107/62 110/81  Pulse: 69  63 63  Temp:    97.1 F (36.2 C)  TempSrc:    Axillary  Resp: Height:      Weight:    188 lb 11.4 oz (85.6 kg)  SpO2: 93% 96% 96% 96%    GEN- The patient is well appearing, alert and oriented x 3 today.   HEENT: normocephalic, atraumatic; sclera clear, conjunctiva pink; hearing intact; oropharynx clear; neck supple Lungs- normal work of breathing, scattered expiratory wheezing Heart- Regular rate and rhythm (paced) GI- soft, non-tender, non-distended, bowel sounds present Extremities- no clubbing, cyanosis, or edema; DP/PT/radial pulses 2+ bilaterally, groin without hematoma/bruit MS- no significant deformity or atrophy Skin- warm and dry, no rash or lesion Psych- euthymic mood, full affect Neuro- strength and sensation are intact   Labs:   Lab Results  Component Value Date   WBC 6.7 05/18/2016   HGB 13.6 05/18/2016   HCT 42.4 05/18/2016   MCV 84.5 05/18/2016   PLT 191 05/18/2016     Recent Labs Lab 06/02/16 0527  NA 144  K 3.0*  CL 107  CO2 30  BUN 20  CREATININE 1.44*  CALCIUM 8.2*  GLUCOSE 99     Discharge Medications:    Medication List    TAKE these medications        albuterol 108 (90 Base) MCG/ACT inhaler  Commonly known as:  PROVENTIL HFA;VENTOLIN HFA  Inhale 2 puffs into the lungs every 4 (four) hours as needed for wheezing.     amiodarone 200 MG tablet  Commonly known as:  PACERONE  Take 1  tablet (200 mg total) by mouth 2 (two) times daily.     amLODipine 10 MG tablet  Commonly known as:  NORVASC  Take 10 mg by mouth daily.     aspirin EC 81 MG tablet  Take 1 tablet (81 mg total) by mouth daily.     citalopram 10 MG tablet  Commonly known as:  CELEXA  Take 10 mg by mouth daily.     dabigatran 150 MG Caps capsule  Commonly known as:  PRADAXA  Take 1 capsule (150 mg total) by mouth 2 (two) times daily.     fluconazole 100 MG tablet  Commonly known as:  DIFLUCAN  Take 1 tablet (100 mg total) by mouth daily.     furosemide 20 MG tablet  Commonly known as:  LASIX  Take 3 tablets (60 mg total) by mouth daily.     ibuprofen 200 MG tablet  Commonly known as:  ADVIL,MOTRIN  Take 400 mg by mouth every 6 (six) hours as needed (For pain.).     ipratropium-albuterol 0.5-2.5 (3) MG/3ML Soln  Commonly known as:  DUONEB  Take 3 mLs by nebulization every 4 (four) hours as needed (for shortness of breath).     lisinopril 10 MG tablet  Commonly known as:  PRINIVIL,ZESTRIL  Take 1 tablet (10 mg total) by mouth daily.     metoprolol succinate 25 MG 24 hr tablet  Commonly known as:  TOPROL-XL  Take 1 tablet (25 mg total) by mouth daily. Please schedule appointment for refills.     multivitamin with minerals Tabs tablet  Take 1 tablet by mouth daily.     niacin 500 MG CR tablet  Commonly known as:  NIASPAN  Take 1,000 mg by mouth 2 (two) times daily.     nitroGLYCERIN 0.4 MG SL tablet  Commonly known as:  NITROSTAT  Place 1 tablet (0.4 mg total) under the tongue every 5 (five) minutes as needed for chest pain.     pantoprazole 40 MG tablet  Commonly known as:  PROTONIX  Take 1 tablet (40 mg total) by mouth 2 (two) times daily.     potassium chloride SA 20 MEQ tablet  Commonly known as:  K-DUR,KLOR-CON  Take 80 tablets by mouth 2 (two) times daily.     pravastatin 80 MG tablet  Commonly known as:  PRAVACHOL  Take 1 tablet (80 mg total) by mouth daily.      tiotropium 18 MCG inhalation capsule  Commonly known as:  SPIRIVA HANDIHALER  Place 1 capsule (18 mcg total) into inhaler and inhale daily.        Disposition:  Discharge Instructions    Diet - low sodium heart healthy    Complete by:  As directed      Discharge instructions    Complete by:  As directed   No driving for 4 days. No lifting over 5 lbs for 1 week. No sexual activity for 1 week. Keep procedure  site clean & dry. If you notice increased pain, swelling, bleeding or pus, call/return!  You may shower, but no soaking baths/hot tubs/pools for 1 week.     Increase activity slowly    Complete by:  As directed           Follow-up Information    Follow up with [name], MD On 06/07/2016.   Specialty:  Cardiology   Why:  at [facility] information:   [address] [place] [address] [phone]       Duration of Discharge Encounter: Greater than 30 minutes including physician time.  Signed, Gypsy Balsam, NP 06/02/2016 7:08 AM   I have seen, examined the patient, and reviewed the above assessment and plan.  On exam comfortable, RRR.  Changes to above are made where necessary.    Co Sign: [name], MD 06/02/2016 10:24 PM

## 2016-06-01 NOTE — H&P (View-Only) (Signed)
Electrophysiology Office Note Date: 05/18/2016  ID:  [name] [name], DOB [birth year], MRN [id number]  PCP: Gilford Rile, MD Primary Cardiologist: Stanford Breed Electrophysiologist: [name]  CC: discuss Watchman  [name] [name] is a 75 y.o. male seen today for Dr Rayann Heman.  He presents today for follow up of atrial fibrillation and Watchman discussion.  Since last being seen in clinic, the patient reports doing reasonably well.  His breathing has been stable. He was taking Lasix as needed instead of daily, we discussed importance of taking daily today.  He denies chest pain, palpitations, dyspnea, PND, orthopnea, nausea, vomiting, dizziness, syncope, edema, or early satiety.  He has not had ICD shocks. He has had no further GI bleeding that he is aware of.  Device History: STJ CRTD implanted 2013 for ICM, chronic systolic heart failure History of appropriate therapy: No History of AAD therapy: Yes - amiodarone for AF    Past Medical History  Diagnosis Date  . PAF (paroxysmal atrial fibrillation) (HCC)     a. on amio and pradaxa.   [name] CAD (coronary artery disease)     a. s/p CABG;  cath 10/07:  LM ok, LAD occluded, AV CFX 95%, pRCA occluded; L-LAD, S-OM2/OM3, S-PDA ok  . Cerebrovascular disease     a. s/p prior Left CEA;  followed by Dr. Donnetta Hutching  . COPD (chronic obstructive pulmonary disease) ([place])   . Hyperlipidemia   . Hypertension   . Nephrolithiasis   . Ischemic cardiomyopathy     a. EF 25% by last myoview in 11/2015. s/p CRT-D  . Iliac artery aneurysm, left (HCC)     followed by Dr. Donnetta Hutching  . Aortic dissection (HCC)     H/O focal aortic dissection  . LBBB (left bundle branch block)   . CKD (chronic kidney disease)   . Stroke [facility])     a. on coumadin --> switched to pradaxa  . CVA (cerebral infarction)   . DVT (deep venous thrombosis) ([place])   . AICD (automatic cardioverter/defibrillator) present    Past Surgical History  Procedure Laterality Date  . Uteroscopy    .  Coronary artery bypass graft  1998  . Appendectomy  09/11/11  . Biventricular defibrillator implantation  08/22/12    SJM Quadra Assura BiV ICD implanted by Dr Rayann Heman  . Spine surgery  Sep 27, 2014    Lower Back L 4  and   L 5  . Bi-ventricular implantable cardioverter defibrillator N/A 08/22/2012    Procedure: BI-VENTRICULAR IMPLANTABLE CARDIOVERTER DEFIBRILLATOR  (CRT-D);  Surgeon: Thompson Grayer, MD;  Location[facility] CATH LAB;  Service: Cardiovascular;  Laterality: N/A;  . Carotid endarterectomy  July  2005    left  . Cardiac catheterization  2013  . Abdominal aortic aneurysm repair N/A 01/03/2016    Procedure: ANEURYSM ABDOMINAL AORTA, BILATERAL COMMON ILIAC REPAIR;  Surgeon: Rosetta Posner, MD;  [facility];  Service: Vascular;  Laterality: N/A;  . Esophagogastroduodenoscopy N/A 02/28/2016    Procedure: ESOPHAGOGASTRODUODENOSCOPY (EGD);  Surgeon: Ladene Artist, MD;  Location: [facility] ENDOSCOPY;  Service: Endoscopy;  Laterality: N/A;    Current Outpatient Prescriptions  Medication Sig Dispense Refill  . albuterol (PROVENTIL HFA;VENTOLIN HFA) 108 (90 BASE) MCG/ACT inhaler Inhale 2 puffs into the lungs every 4 (four) hours as needed for wheezing. 1 Inhaler 0  . amiodarone (PACERONE) 200 MG tablet Take 200 mg by mouth daily.    [name] amLODipine (NORVASC) 10 MG tablet Take 10 mg by mouth daily.    [name]  citalopram (CELEXA) 10 MG tablet Take 10 mg by mouth daily.    . dabigatran (PRADAXA) 150 MG CAPS capsule Take 1 capsule (150 mg total) by mouth 2 (two) times daily. 180 capsule 3  . fluconazole (DIFLUCAN) 100 MG tablet Take 1 tablet (100 mg total) by mouth daily. 7 tablet 0  . furosemide (LASIX) 20 MG tablet Take 3 tablets (60 mg total) by mouth daily. 90 tablet 11  . ipratropium-albuterol (DUONEB) 0.5-2.5 (3) MG/3ML SOLN Take 3 mLs by nebulization every 4 (four) hours as needed (for shortness of breath). 360 mL 5  . lisinopril (PRINIVIL,ZESTRIL) 10 MG tablet Take 1 tablet (10 mg total) by mouth daily. 90  tablet 3  . metoprolol succinate (TOPROL-XL) 25 MG 24 hr tablet Take 1 tablet (25 mg total) by mouth daily. Please schedule appointment for refills. 90 tablet 0  . Multiple Vitamin (MULITIVITAMIN WITH MINERALS) TABS Take 1 tablet by mouth daily.    . niacin (NIASPAN) 500 MG CR tablet Take 1,000 mg by mouth 2 (two) times daily.     . nitroGLYCERIN (NITROSTAT) 0.4 MG SL tablet Place 1 tablet (0.4 mg total) under the tongue every 5 (five) minutes as needed for chest pain. 100 tablet 3  . pantoprazole (PROTONIX) 40 MG tablet Take 1 tablet (40 mg total) by mouth 2 (two) times daily. 60 tablet 0  . pravastatin (PRAVACHOL) 80 MG tablet Take 1 tablet (80 mg total) by mouth daily. 90 tablet 2  . tiotropium (SPIRIVA HANDIHALER) 18 MCG inhalation capsule Place 1 capsule (18 mcg total) into inhaler and inhale daily. 30 capsule 2  . potassium chloride SA (K-DUR,KLOR-CON) 20 MEQ tablet Take 4 tablets by mouth 2 (two) times daily.     No current facility-administered medications for this visit.    Allergies:   Buprenorphine hcl and Morphine and related   Social History: Social History   Social History  . Marital Status: Married    Spouse Name: N/A  . Number of Children: 3  . Years of Education: 10 TH   Occupational History  . RETIRED---truck driver    Social History Main Topics  . Smoking status: Former Smoker -- 1.50 packs/day for 57 years    Types: Cigarettes    Quit date: 12/11/2004  . Smokeless tobacco: Never Used  . Alcohol Use: No     Comment: 6 yrs ago   . Drug Use: No  . Sexual Activity: No   Other Topics Concern  . Not on file   Social History Narrative   Patient is married with 3 children.   Patient is right handed.   Patient has 10 th grade education.   Patient drinks tea occasionally.    Family History: Family History  Problem Relation Age of Onset  . Stroke Maternal Uncle   . Heart attack Neg Hx   . Hypertension Neg Hx   . Stroke Maternal Uncle     Review of  Systems: All other systems reviewed and are otherwise negative except as noted above.   Physical Exam: VS:  BP 136/80 mmHg  Pulse 72  Ht 6' 1" (1.854 m)  Wt 192 lb 3.2 oz (87.181 kg)  BMI 25.36 kg/m2  SpO2 95% , BMI Body mass index is 25.36 kg/(m^2).  GEN- The patient is elderly appearing, alert and oriented x 3 today.   HEENT: normocephalic, atraumatic; sclera clear, conjunctiva pink; hearing intact; oropharynx clear; neck supple  Lungs- Clear to ausculation bilaterally, normal work of breathing.  No   wheezes, rales, rhonchi Heart- Irregular rate and rhythm, no murmurs, rubs or gallops  GI- soft, non-tender, non-distended, bowel sounds present  Extremities- no clubbing, cyanosis, or edema; DP/PT/radial pulses 2+ bilaterally MS- no significant deformity or atrophy Skin- warm and dry, no rash or lesion; ICD pocket well healed Psych- euthymic mood, full affect Neuro- strength and sensation are intact  ICD interrogation- reviewed in detail today,  See PACEART report  EKG:  EKG is not ordered today.  Recent Labs: 03/13/2016: Hemoglobin 11.2*; Platelets 163 03/15/2016: Magnesium 1.7 03/28/2016: Brain Natriuretic Peptide 1794.7* 05/10/2016: ALT 9; BUN 17; Creat 1.01; Potassium 4.1; Sodium 142; TSH 0.84   Wt Readings from Last 3 Encounters:  05/18/16 192 lb 3.2 oz (87.181 kg)  05/10/16 189 lb (85.73 kg)  05/04/16 184 lb 12.8 oz (83.825 kg)     Other studies Reviewed: Additional studies/ records that were reviewed today include: Dr Jackalyn Lombard office notes, hospital records  Assessment and Plan:  1.  Permanent atrial fibrillation Asymptomatic but ventricular rates are limiting CRT pacing - will increase amiodarone to 272m twice daily and anticipate cardioversion at time of watchman or at 6 week TEE. He reports compliance with Pradaxa for at least the last 4 weeks.  Recent LFT's, TSH normal. With prior GI bleed, he is not felt to be a candidate for long term anticoagulation.  He has  discussed Watchman with Dr ARayann Hemanpreviously. TEE unable to be performed 03/2016 2/2 esophageal stricture and cadida esophagitis.  He has undergone treatment and seen back by Dr CStanford Breedwho felt he could undergo repeat TEE attempt.  This patients CHA2DS2-VASc Score and unadjusted Ischemic Stroke Rate (% per year) is equal to 11.2 % stroke rate/year from a score of 7 which necessitates long term oral anticoagulation to prevent stroke. HasBled score is 4.  Modified Rankin Score is 0.  Procedural risks for the Watchman implant have been reviewed with the patient including a 1% risk of stroke, 2% risk of perforation, 0.1% risk of device embolization.   Will schedule TEE and Watchman implant for 06/01/16.  The patient is aware that Watchman placement is dependent on TEE findings. Discussed with patient possibility of DCCV at time of Watchman implant depending on findings.   2. Chronic systolic dysfunction Slightly volume overloaded today and weight is up CorVue elevated last 11 days  Stable on an appropriate medical regimen Normal ICD function with decreased CRT pacing as above. Will follow for now Discussed importance of daily Lasix (was taking as needed) See Pace Art report No changes today  3.  HTN Stable No change required today  Current medicines are reviewed at length with the patient today.   The patient does not have concerns regarding his medicines.  The following changes were made today:  none  Labs/ tests ordered today include: pre-procedure labs  Orders Placed This Encounter  Procedures  . Basic Metabolic Panel (BMET)  . Comp Met (CMET)    Disposition:   Follow up after Watchman  Signed, AChanetta Marshall NP 05/18/2016 8:11 AM  [place] [phone](office) [phone](fax)

## 2016-06-02 ENCOUNTER — Encounter (HOSPITAL_COMMUNITY): Payer: Self-pay | Admitting: Internal Medicine

## 2016-06-02 DIAGNOSIS — I481 Persistent atrial fibrillation: Principal | ICD-10-CM

## 2016-06-02 LAB — BASIC METABOLIC PANEL
ANION GAP: 7 (ref 5–15)
BUN: 20 mg/dL (ref 6–20)
CHLORIDE: 107 mmol/L (ref 101–111)
CO2: 30 mmol/L (ref 22–32)
Calcium: 8.2 mg/dL — ABNORMAL LOW (ref 8.9–10.3)
Creatinine, Ser: 1.44 mg/dL — ABNORMAL HIGH (ref 0.61–1.24)
GFR calc Af Amer: 54 mL/min — ABNORMAL LOW (ref >60)
GFR calc non Af Amer: 47 mL/min — ABNORMAL LOW (ref >60)
GLUCOSE: 99 mg/dL (ref 65–99)
POTASSIUM: 3 mmol/L — AB (ref 3.5–5.1)
Sodium: 144 mmol/L (ref 135–145)

## 2016-06-02 MED ORDER — ASPIRIN EC 81 MG PO TBEC: 81.0000 mg | DELAYED_RELEASE_TABLET | Freq: Every day | ORAL | Status: DC

## 2016-06-02 MED ORDER — POTASSIUM CHLORIDE CRYS ER 20 MEQ PO TBCR
40.0000 meq | EXTENDED_RELEASE_TABLET | Freq: Once | ORAL | Status: AC
  Administered 2016-06-02: 40 meq via ORAL
  Filled 2016-06-02: qty 2

## 2016-06-06 NOTE — Progress Notes (Signed)
Electrophysiology Office Note Date: 06/07/2016  ID:  [name] [name], DOB 05/27/1941, MRN [id number]  PCP: [name] Rossetti, MD Primary Cardiologist: [name] [name] Electrophysiologist: [name] [name]  CC: follow up [name] [name] is a 75 y.o. male seen today post Watchman implant.  Since discharge, the patient reports doing reasonably well.  He has returned to AF after discharge.  He reports compliance with medications today. He has not had procedural related complications.  He denies chest pain, palpitations, dyspnea, PND, orthopnea, nausea, vomiting, dizziness, syncope, edema, or early satiety.  He has not had ICD shocks. He has had no further GI bleeding that he is aware of.  Device History: STJ CRTD implanted 2013 for ICM, chronic systolic heart failure History of appropriate therapy: No History of AAD therapy: Yes - amiodarone for AF    Past Medical History  Diagnosis Date  . PAF (paroxysmal atrial fibrillation) (HCC)     a. on amio and pradaxa.   [name] CAD (coronary artery disease)     a. s/p CABG;  cath 10/07:  LM ok, LAD occluded, AV CFX 95%, pRCA occluded; L-LAD, S-OM2/OM3, S-PDA ok  . Cerebrovascular disease     a. s/p prior Left CEA;  followed by Dr. Arbie [name]  . COPD (chronic obstructive pulmonary disease) (HCC)   . Hyperlipidemia   . Hypertension   . Nephrolithiasis   . Ischemic cardiomyopathy     a. EF 25% by last myoview in 11/2015. s/p CRT-D  . Iliac artery aneurysm, left (HCC)     followed by Dr. Arbie [name]  . Aortic dissection (HCC)     H/O focal aortic dissection  . LBBB (left bundle branch block)   . CKD (chronic kidney disease)   . DVT (deep venous thrombosis) (HCC)   . AICD (automatic cardioverter/defibrillator) present   . CHF (congestive heart failure) (HCC)   . Myocardial infarction (HCC) 1997  . Anginal pain (HCC)   . History of stomach ulcers   . Stroke (HCC) 12/2015 "several mini"    a. on coumadin --> switched to pradaxa  . Depression    Past Surgical  History  Procedure Laterality Date  . Cardiac catheterization  1998; 2013  . Coronary artery bypass graft  1998    CABG X5  . Appendectomy  09/11/11  . Bi-ventricular implantable cardioverter defibrillator  (crt-d)  08/22/2012    SJM Quadra Assura BiV ICD implanted by Dr [name]  . Bi-ventricular implantable cardioverter defibrillator N/A 08/22/2012    Procedure: BI-VENTRICULAR IMPLANTABLE CARDIOVERTER DEFIBRILLATOR  (CRT-D);  Surgeon: [name], MD;  Location: [facility] CATH LAB;  Service: Cardiovascular;  Laterality: N/A;  . Carotid endarterectomy Left July  2005  . Abdominal aortic aneurysm repair N/A 01/03/2016    Procedure: ANEURYSM ABDOMINAL AORTA, BILATERAL COMMON ILIAC REPAIR;  Surgeon: [name] Earthly, MD;  Location: [name] OR;  Service: Vascular;  Laterality: N/A;  . Esophagogastroduodenoscopy N/A 02/28/2016    Procedure: ESOPHAGOGASTRODUODENOSCOPY (EGD);  Surgeon: [name] Dare, MD;  Location: [facility] ENDOSCOPY;  Service: Endoscopy;  Laterality: N/A;  . Cardiac catheterization  06/01/2016    atrial appendage occlusion  . Posterior fusion lumbar spine  Sep 27, 2014    L4-L5  . Back surgery    . Cystoscopy w/ stone manipulation  X 2  . Left atrial appendage occlusion N/A 06/01/2016    Procedure: LEFT ATRIAL APPENDAGE OCCLUSION;  Surgeon: [name], MD;  Location: [facility] INVASIVE CV LAB;  Service: Cardiovascular;  Laterality: N/A;    Current Outpatient Prescriptions  Medication Sig Dispense Refill  . albuterol (PROVENTIL HFA;VENTOLIN HFA) 108 (90 BASE) MCG/ACT inhaler Inhale 2 puffs into the lungs every 4 (four) hours as needed for wheezing. 1 Inhaler 0  . amiodarone (PACERONE) 200 MG tablet Take 1 tablet (200 mg total) by mouth 2 (two) times daily. 60 tablet 11  . amLODipine (NORVASC) 10 MG tablet Take 10 mg by mouth daily.    [name] aspirin EC 81 MG tablet Take 1 tablet (81 mg total) by mouth daily.    . citalopram (CELEXA) 10 MG tablet Take 10 mg by mouth daily. Reported on 06/06/2016    . dabigatran  (PRADAXA) 150 MG CAPS capsule Take 1 capsule (150 mg total) by mouth 2 (two) times daily. 180 capsule 3  . fluconazole (DIFLUCAN) 100 MG tablet Take 1 tablet (100 mg total) by mouth daily. 7 tablet 0  . furosemide (LASIX) 20 MG tablet Take 3 tablets (60 mg total) by mouth daily. 90 tablet 11  . ibuprofen (ADVIL,MOTRIN) 200 MG tablet Take 400 mg by mouth every 6 (six) hours as needed (For pain.).    [name] ipratropium-albuterol (DUONEB) 0.5-2.5 (3) MG/3ML SOLN Take 3 mLs by nebulization every 4 (four) hours as needed (for shortness of breath). 360 mL 5  . lisinopril (PRINIVIL,ZESTRIL) 10 MG tablet Take 1 tablet (10 mg total) by mouth daily. 90 tablet 3  . metoprolol succinate (TOPROL-XL) 50 MG 24 hr tablet Take 1 tablet (50 mg total) by mouth daily. Please schedule appointment for refills. 90 tablet 3  . Multiple Vitamin (MULITIVITAMIN WITH MINERALS) TABS Take 1 tablet by mouth daily.    . niacin (NIASPAN) 500 MG CR tablet Take 1,000 mg by mouth 2 (two) times daily.     . nitroGLYCERIN (NITROSTAT) 0.4 MG SL tablet Place 1 tablet (0.4 mg total) under the tongue every 5 (five) minutes as needed for chest pain. 100 tablet 3  . pantoprazole (PROTONIX) 40 MG tablet Take 1 tablet (40 mg total) by mouth 2 (two) times daily. 60 tablet 0  . potassium chloride SA (K-DUR,KLOR-CON) 20 MEQ tablet Take 80 mEq by mouth 2 (two) times daily.     . pravastatin (PRAVACHOL) 80 MG tablet Take 1 tablet (80 mg total) by mouth daily. 90 tablet 2  . tiotropium (SPIRIVA HANDIHALER) 18 MCG inhalation capsule Place 1 capsule (18 mcg total) into inhaler and inhale daily. 30 capsule 2   No current facility-administered medications for this visit.    Allergies:   Buprenorphine hcl and Morphine and related   Social History: Social History   Social History  . Marital Status: Widowed    Spouse Name: N/A  . Number of Children: 3  . Years of Education: 10 TH   Occupational History  . RETIRED---truck driver    Social History  Main Topics  . Smoking status: Current Every Day Smoker -- 0.50 packs/day for 48 years    Types: Cigarettes    Last Attempt to Quit: 12/11/2004  . Smokeless tobacco: Never Used  . Alcohol Use: 0.0 oz/week    0 Standard drinks or equivalent per week     Comment: 06/01/2016 "haven't had a drink in years"  . Drug Use: No  . Sexual Activity: Yes   Other Topics Concern  . Not on file   Social History Narrative   Patient is married with 3 children.   Patient is right handed.   Patient has 10 th grade education.   Patient drinks tea occasionally.    Family  History: Family History  Problem Relation Age of Onset  . Stroke Maternal Uncle   . Heart attack Neg Hx   . Hypertension Neg Hx   . Stroke Maternal Uncle     Review of Systems: All other systems reviewed and are otherwise negative except as noted above.   Physical Exam: VS:  BP 132/72 mmHg  Pulse 76  Ht  (1.854 m)  Wt 190 lb 9.6 oz (86.456 kg)  BMI 25.15 kg/m2 , BMI Body mass index is 25.15 kg/(m^2).  GEN- The patient is elderly appearing, alert and oriented x 3 today.   HEENT: normocephalic, atraumatic; sclera clear, conjunctiva pink; hearing intact; oropharynx clear; neck supple  Lungs- Clear to ausculation bilaterally, normal work of breathing.  No wheezes, rales, rhonchi Heart- Irregular rate and rhythm, no murmurs, rubs or gallops  GI- soft, non-tender, non-distended, bowel sounds present  Extremities- no clubbing, cyanosis, or edema; DP/PT/radial pulses 2+ bilaterally MS- no significant deformity or atrophy Skin- warm and dry, no rash or lesion; ICD pocket well healed Psych- euthymic mood, full affect Neuro- strength and sensation are intact  ICD interrogation- reviewed in detail today,  See PACEART report  EKG:  EKG is ordered today. EKG today showed AF with controlled V rate, LBBB  Recent Labs: 03/15/2016: Magnesium 1.7 03/28/2016: Brain Natriuretic Peptide 1794.7* 05/10/2016: ALT 9; TSH 0.84 05/18/2016:  Hemoglobin 13.6; Platelets 191 06/02/2016: BUN 20; Creatinine, Ser 1.44*; Potassium 3.0*; Sodium 144   Wt Readings from Last 3 Encounters:  06/07/16 190 lb 9.6 oz (86.456 kg)  06/02/16 188 lb 11.4 oz (85.6 kg)  05/18/16 192 lb 3.2 oz (87.181 kg)     Other studies Reviewed: Additional studies/ records that were reviewed today include: Dr Jenel Lucks office notes, hospital records  Assessment and Plan:  1.  Persistent atrial fibrillation Doing well s/p Watchman and cardioversion last week, but now back in AF with decreased CRT pacing Continue Pradaxa and ASA  daily for now - importance of compliance discussed today TEE at 6 weeks to evaluate device placement and leaks (will schedule with Dr [name] [name] as he knows the patient well) Will continue amiodarone at increased dose until 6 week TEE and do cardioversion again at that time.  If fails cardioversion then, would discontinue amiodarone and consider AVN ablation  2. Chronic systolic dysfunction Euvolemic on exam  Stable on an appropriate medical regimen Normal ICD function  See Pace Art report No changes today  3.  HTN Stable No change required today  Current medicines are reviewed at length with the patient today.   The patient does not have concerns regarding his medicines.  The following changes were made today:  none  Labs/ tests ordered today include: pre-procedure labs  Orders Placed This Encounter  Procedures  . Basic metabolic panel  . CBC with Differential  . EKG 12-Lead    Disposition:   Follow up with EP NP after 6 week TEE  Signed, [name], MD  06/07/2016 4:50 PM  [facility] [address] [place] [address] [phone] (office) [phone] (fax)

## 2016-06-07 ENCOUNTER — Ambulatory Visit (INDEPENDENT_AMBULATORY_CARE_PROVIDER_SITE_OTHER): Payer: Medicare Other | Admitting: Internal Medicine

## 2016-06-07 ENCOUNTER — Encounter: Payer: Self-pay | Admitting: Internal Medicine

## 2016-06-07 VITALS — BP 132/72 | HR 76 | Ht 73.0 in | Wt 190.6 lb

## 2016-06-07 DIAGNOSIS — K922 Gastrointestinal hemorrhage, unspecified: Secondary | ICD-10-CM

## 2016-06-07 DIAGNOSIS — I5022 Chronic systolic (congestive) heart failure: Secondary | ICD-10-CM

## 2016-06-07 DIAGNOSIS — I482 Chronic atrial fibrillation: Secondary | ICD-10-CM

## 2016-06-07 DIAGNOSIS — I10 Essential (primary) hypertension: Secondary | ICD-10-CM

## 2016-06-07 DIAGNOSIS — I4821 Permanent atrial fibrillation: Secondary | ICD-10-CM

## 2016-06-07 DIAGNOSIS — I255 Ischemic cardiomyopathy: Secondary | ICD-10-CM | POA: Diagnosis not present

## 2016-06-07 MED ORDER — METOPROLOL SUCCINATE ER 50 MG PO TB24: 50.0000 mg | ORAL_TABLET | Freq: Every day | ORAL | Status: DC

## 2016-06-07 NOTE — Patient Instructions (Signed)
Medication Instructions:  Your physician has recommended you make the following change in your medication:  1) Increase Metoprolol to 50 mg daily   Labwork: Your physician recommends that you return for lab work on 07/10/16   Testing/Procedures:  Your physician has requested that you have a TEE/Cardioversion. During a TEE, sound waves are used to create images of your heart. It provides your doctor with information about the size and shape of your heart and how well your heart's chambers and valves are working. In this test, a transducer is attached to the end of a flexible tube that is guided down you throat and into your esophagus (the tube leading from your mouth to your stomach) to get a more detailed image of your heart. Once the TEE has determined that a blood clot is not present, the cardioversion begins. Electrical Cardioversion uses a jolt of electricity to your heart either through paddles or wired patches attached to your chest. This is a controlled, usually prescheduled, procedure. This procedure is done at the hospital and you are not awake during the procedure. You usually go home the day of the procedure. Please see the instruction sheet given to you today for more information---.07/18/16  Please arrive at [facility] at 10:30am Do not eat or drink after midnight the night prior to the procedure      Follow-Up: Your physician recommends that you schedule a follow-up appointment with Gypsy Balsam, NP 2 weeks from 07/11/16   Any Other Special Instructions Will Be Listed Below (If Applicable).     If you need a refill on your cardiac medications before your next appointment, please call your pharmacy.

## 2016-06-08 NOTE — Progress Notes (Signed)
ICM remote transmission rescheduled to 07/12/2016.  Patient was seen in office for defib check on 06/07/2016.

## 2016-06-22 ENCOUNTER — Ambulatory Visit (INDEPENDENT_AMBULATORY_CARE_PROVIDER_SITE_OTHER): Payer: Medicare Other | Admitting: Nurse Practitioner

## 2016-06-22 ENCOUNTER — Encounter: Payer: Self-pay | Admitting: Nurse Practitioner

## 2016-06-22 VITALS — BP 131/86 | HR 56 | Ht 73.0 in | Wt 182.2 lb

## 2016-06-22 DIAGNOSIS — H8110 Benign paroxysmal vertigo, unspecified ear: Secondary | ICD-10-CM | POA: Diagnosis not present

## 2016-06-22 DIAGNOSIS — E785 Hyperlipidemia, unspecified: Secondary | ICD-10-CM | POA: Diagnosis not present

## 2016-06-22 DIAGNOSIS — Z8673 Personal history of transient ischemic attack (TIA), and cerebral infarction without residual deficits: Secondary | ICD-10-CM | POA: Diagnosis not present

## 2016-06-22 DIAGNOSIS — I255 Ischemic cardiomyopathy: Secondary | ICD-10-CM | POA: Diagnosis not present

## 2016-06-22 DIAGNOSIS — I482 Chronic atrial fibrillation: Secondary | ICD-10-CM | POA: Diagnosis not present

## 2016-06-22 DIAGNOSIS — I4821 Permanent atrial fibrillation: Secondary | ICD-10-CM

## 2016-06-22 NOTE — Progress Notes (Signed)
[facility]  PATIENT: [name] [name] DOB: March 12, 1941   REASON FOR VISIT: Follow-up for history of stroke, paroxysmal atrial fibrillation, hypertension, hyperlipidemia vertigo HISTORY FROM: Patient    HISTORY OF PRESENT ILLNESS:History Summary [name] [name] is a 75 y.o. [name] with history of atrial fibrillation on Pradaxa, CAD, prior left CEA, AAA, COPD, hyperlipidemia, hypertension, ischemic cardiomyopathy s/p pacemaker and previous stroke was admitted on 01/10/2015 for dizziness and right hemiparesis with hemisensory loss and vomiting. MRI not able to perform due to pacemaker. Initial CT and repeat CT did not show acute abnormalities. Stroke workup including CTA head and neck, carotid Doppler and 2-D echo unremarkable. Neuro exam found to have possible positive Dix-Hallpike and giveaway weakness on the right side. By further questioning, patient has suffered depression since his wife passed away. He was put on Celexa, and referred to outpatient BPPV maneuver. He was discharged in good condition with continuation of his Pradaxa and The statin for stroke prevention.  03/01/15 follow up Dr. Erlinda Hong- the patient has been doing well. He stated that he had worked with PT OT twice a week for maneuver training and vestibular rehabilitation, and he is feeling great. His blood pressure 133/80 in clinic today. He has no weakness or numbness or any neurological deficit. His Celexa has been discontinued by his own. He stated that his depression much better, and he recently met a lady who also recently lost her husband, and they have good relationship each other.  Interval History 07/05/15 DR. Xu During the interval time, the pt has been doing well. He is not depressed anymore and he feel good. He has stopped celexa and currently not on depression meds. His BP at home around 140-150 and today in clinic 145/79. He is doing BPPV maneuver at home. Still on pradaxa and no  complications. UPDATE 07/13/2017CM [name] [name], 75 year old [name] returns for follow-up.Admitted on 01/10/2015 for dizziness and right hemiparesis with hemisensory loss and vomiting. MRI not able to perform due to pacemaker. Initial CT and repeat CT did not show acute abnormalities. Stroke workup including CTA head and neck, carotid Doppler and 2-D echo unremarkable. Neuro exam found to have possible positive Dix-Hallpike and giveaway weakness on the right side. Blood pressure is controlled today in the office at 131/86. He has not had any stroke or TIA symptoms. He remains on Pradaxa Pravachol and aspirin for secondary stroke prevention. He returns for reevaluation  REVIEW OF SYSTEMS: Full 14 system review of systems performed and notable only for those listed, all others are neg:  Constitutional: neg  Cardiovascular: neg Ear/Nose/Throat: neg  Skin: neg Eyes: neg Respiratory: Wheezing Gastroitestinal: neg  Hematology/Lymphatic: neg  Endocrine: neg Musculoskeletal:neg Allergy/Immunology: neg Neurological: neg Psychiatric: neg Sleep : neg   ALLERGIES: Allergies  Allergen Reactions  . Buprenorphine Hcl Other (See Comments)    Patient stated that he did not do well on this medication. He felt like climbing the walls  . Morphine And Related Other (See Comments)    Patient stated that he did not do well on this medication. He felt like climbing the walls    HOME MEDICATIONS: Outpatient Prescriptions Prior to Visit  Medication Sig Dispense Refill  . albuterol (PROVENTIL HFA;VENTOLIN HFA) 108 (90 BASE) MCG/ACT inhaler Inhale 2 puffs into the lungs every 4 (four) hours as needed for wheezing. 1 Inhaler 0  . amiodarone (PACERONE) 200 MG tablet Take 1 tablet (200 mg total) by mouth 2 (two) times daily. 60 tablet 11  . amLODipine (  NORVASC) 10 MG tablet Take 10 mg by mouth daily.    [name] aspirin EC 81 MG tablet Take 1 tablet (81 mg total) by mouth daily.    . citalopram (CELEXA) 10 MG tablet Take  10 mg by mouth daily. Reported on 06/06/2016    . dabigatran (PRADAXA) 150 MG CAPS capsule Take 1 capsule (150 mg total) by mouth 2 (two) times daily. 180 capsule 3  . furosemide (LASIX) 20 MG tablet Take 3 tablets (60 mg total) by mouth daily. 90 tablet 11  . ibuprofen (ADVIL,MOTRIN) 200 MG tablet Take 400 mg by mouth every 6 (six) hours as needed (For pain.).    [name] ipratropium-albuterol (DUONEB) 0.5-2.5 (3) MG/3ML SOLN Take 3 mLs by nebulization every 4 (four) hours as needed (for shortness of breath). 360 mL 5  . lisinopril (PRINIVIL,ZESTRIL) 10 MG tablet Take 1 tablet (10 mg total) by mouth daily. 90 tablet 3  . metoprolol succinate (TOPROL-XL) 50 MG 24 hr tablet Take 1 tablet (50 mg total) by mouth daily. Please schedule appointment for refills. 90 tablet 3  . Multiple Vitamin (MULITIVITAMIN WITH MINERALS) TABS Take 1 tablet by mouth daily.    . niacin (NIASPAN) 500 MG CR tablet Take 1,000 mg by mouth 2 (two) times daily.     . nitroGLYCERIN (NITROSTAT) 0.4 MG SL tablet Place 1 tablet (0.4 mg total) under the tongue every 5 (five) minutes as needed for chest pain. 100 tablet 3  . potassium chloride SA (K-DUR,KLOR-CON) 20 MEQ tablet Take 80 mEq by mouth 2 (two) times daily.     . pravastatin (PRAVACHOL) 80 MG tablet Take 1 tablet (80 mg total) by mouth daily. 90 tablet 2  . tiotropium (SPIRIVA HANDIHALER) 18 MCG inhalation capsule Place 1 capsule (18 mcg total) into inhaler and inhale daily. 30 capsule 2  . fluconazole (DIFLUCAN) 100 MG tablet Take 1 tablet (100 mg total) by mouth daily. (Patient not taking: Reported on 06/22/2016) 7 tablet 0  . pantoprazole (PROTONIX) 40 MG tablet Take 1 tablet (40 mg total) by mouth 2 (two) times daily. 60 tablet 0   No facility-administered medications prior to visit.    PAST MEDICAL HISTORY: Past Medical History  Diagnosis Date  . PAF (paroxysmal atrial fibrillation) (HCC)     a. on amio and pradaxa.   [name] CAD (coronary artery disease)     a. s/p CABG;   cath 10/07:  LM ok, LAD occluded, AV CFX 95%, pRCA occluded; L-LAD, S-OM2/OM3, S-PDA ok  . Cerebrovascular disease     a. s/p prior Left CEA;  followed by Dr. Donnetta Hutching  . COPD (chronic obstructive pulmonary disease) ([place])   . Hyperlipidemia   . Hypertension   . Nephrolithiasis   . Ischemic cardiomyopathy     a. EF 25% by last myoview in 11/2015. s/p CRT-D  . Iliac artery aneurysm, left (HCC)     followed by Dr. Donnetta Hutching  . Aortic dissection (HCC)     H/O focal aortic dissection  . LBBB (left bundle branch block)   . CKD (chronic kidney disease)   . DVT (deep venous thrombosis) ([place])   . AICD (automatic cardioverter/defibrillator) present   . CHF (congestive heart failure) ([place])   . Myocardial infarction ([place]) 1997  . Anginal pain ([place])   . History of stomach ulcers   . Stroke ([place]) 12/2015 "several mini"    a. on coumadin --> switched to pradaxa  . Depression     PAST SURGICAL HISTORY: Past Surgical History  Procedure Laterality Date  . Cardiac catheterization  1998; 2013  . Coronary artery bypass graft  1998    CABG X5  . Appendectomy  09/11/11  . Bi-ventricular implantable cardioverter defibrillator  (crt-d)  08/22/2012    SJM Quadra Assura BiV ICD implanted by Dr [name]  . Bi-ventricular implantable cardioverter defibrillator N/A 08/22/2012    Procedure: BI-VENTRICULAR IMPLANTABLE CARDIOVERTER DEFIBRILLATOR  (CRT-D);  Surgeon: [name], MD;  Location: [facility] CATH LAB;  Service: Cardiovascular;  Laterality: N/A;  . Carotid endarterectomy Left July  2005  . Abdominal aortic aneurysm repair N/A 01/03/2016    Procedure: ANEURYSM ABDOMINAL AORTA, BILATERAL COMMON ILIAC REPAIR;  Surgeon: [name], MD;  Location: [facility] OR;  Service: Vascular;  Laterality: N/A;  . Esophagogastroduodenoscopy N/A 02/28/2016    Procedure: ESOPHAGOGASTRODUODENOSCOPY (EGD);  Surgeon: [name], MD;  Location: [facility] ENDOSCOPY;  Service: Endoscopy;  Laterality: N/A;  . Cardiac catheterization  06/01/2016     atrial appendage occlusion  . Posterior fusion lumbar spine  Sep 27, 2014    L4-L5  . Back surgery    . Cystoscopy w/ stone manipulation  X 2  . Left atrial appendage occlusion N/A 06/01/2016    Procedure: LEFT ATRIAL APPENDAGE OCCLUSION;  Surgeon: [name], MD;  Location: [facility] INVASIVE CV LAB;  Service: Cardiovascular;  Laterality: N/A;    FAMILY HISTORY: Family History  Problem Relation Age of Onset  . Stroke Maternal Uncle   . Heart attack Neg Hx   . Hypertension Neg Hx   . Stroke Maternal Uncle     SOCIAL HISTORY: Social History   Social History  . Marital Status: Widowed    Spouse Name: N/A  . Number of Children: 3  . Years of Education: 10 TH   Occupational History  . RETIRED---truck driver    Social History Main Topics  . Smoking status: Current Every Day Smoker -- 0.50 packs/day for 48 years    Types: Cigarettes    Last Attempt to Quit: 12/11/2004  . Smokeless tobacco: Never Used  . Alcohol Use: 0.0 oz/week    0 Standard drinks or equivalent per week     Comment: 06/01/2016 "haven't had a drink in years"  . Drug Use: No  . Sexual Activity: Yes   Other Topics Concern  . Not on file   Social History Narrative   Patient is married with 3 children.   Patient is right handed.   Patient has 10 th grade education.   Patient drinks tea occasionally.     PHYSICAL EXAM  Filed Vitals:   06/22/16 1009  BP: 131/86  Pulse: 56  Height: 6\' 1"  (1.854 m)  Weight: 182 lb 3.2 oz (82.645 kg)   Body mass index is 24.04 kg/(m^2).  Generalized: Well developed, in no acute distress  Head: normocephalic and atraumatic,. Oropharynx benign  Neck: Supple, no carotid bruits  Cardiac: Regular rate rhythm, no murmur  Musculoskeletal: No deformity   Neurological examination   Mentation: Alert oriented to time, place, history taking. Attention span and concentration appropriate. Recent and remote memory intact.  Follows all commands speech and language fluent.   Cranial  nerve II-XII: Fundoscopic exam reveals sharp disc margins.Pupils were equal round reactive to light extraocular movements were full, visual field were full on confrontational test. Facial sensation and strength were normal. hearing was intact to finger rubbing bilaterally. Uvula tongue midline. head turning and shoulder shrug were normal and symmetric.Tongue protrusion into cheek strength was normal. Motor: normal bulk and  tone, full strength in the BUE, BLE, fine finger movements normal, no pronator drift. No focal weakness Sensory: normal and symmetric to light touch, pinprick, and  Vibration, In the upper and lower extremities  Coordination: finger-nose-finger, heel-to-shin bilaterally, no dysmetria Reflexes: 1+ upper lower and symmetric plantar responses were flexor bilaterally. Gait and Station: Rising up from seated position without assistance, normal stance,  moderate stride, good arm swing, smooth turning, able to perform tiptoe, and heel walking without difficulty. Tandem gait is steady and Romberg is negative  DIAGNOSTIC DATA (LABS, IMAGING, TESTING) - I reviewed patient records, labs, notes, testing and imaging myself where available.  Lab Results  Component Value Date   WBC 6.7 05/18/2016   HGB 13.6 05/18/2016   HCT 42.4 05/18/2016   MCV 84.5 05/18/2016   PLT 191 05/18/2016      Component Value Date/Time   NA 144 06/02/2016 0527   K 3.0* 06/02/2016 0527   CL 107 06/02/2016 0527   CO2 30 06/02/2016 0527   GLUCOSE 99 06/02/2016 0527   BUN 20 06/02/2016 0527   CREATININE 1.44* 06/02/2016 0527   CREATININE 1.07 05/18/2016 0827   CALCIUM 8.2* 06/02/2016 0527   PROT 5.9* 05/10/2016 1134   ALBUMIN 3.6 05/10/2016 1134   AST 13 05/10/2016 1134   ALT 9 05/10/2016 1134   ALKPHOS 73 05/10/2016 1134   BILITOT 0.7 05/10/2016 1134   GFRNONAA 47* 06/02/2016 0527   GFRNONAA 58* 02/05/2015 1215   GFRAA 54* 06/02/2016 0527   GFRAA 67 02/05/2015 1215   Lab Results  Component Value  Date   CHOL 144 12/24/2014   HDL 38* 12/24/2014   LDLCALC 75 12/24/2014   LDLDIRECT 91.8 04/28/2010   TRIG 154* 12/24/2014   CHOLHDL 3.8 12/24/2014   Lab Results  Component Value Date   HGBA1C 5.5 12/24/2014   No results found for: VITAMINB12 Lab Results  Component Value Date   TSH 0.84 05/10/2016      ASSESSMENT AND PLAN  75 y.o. year old [name]  has a past medical history of  PMH of afib on Pradaxa, CAD, prior left CEA, AAA, COPD, HLD, HTN, ischemic cardiomyopathy s/p pacemaker and previous strokewas admitted on 01/10/2015 for dizziness and right hemiparesis with hemisensory loss and vomiting. MRI not able to perform due to pacemaker. Initial CT and repeat CT did not show acute abnormalities. Stroke workup including CTA head and neck, carotid Doppler and 2-D echo unremarkable. Neuro exam found to have possible positive Dix-Hallpike and give-away weakness on the right side. By further questioning, patient suffered depression since his wife passed away. He was put on Celexa, and referred to outpatient BPPV maneuver. He was discharged in good condition with continuation of his Pradaxa and statin for stroke prevention.  Still Pradaxa and statin for stroke prevention. Off antidepressants now.    PLAN continue pradaxa and pravachol for stroke prevention continue to follow up with cardiology for intermittent afib and pacemaker management BPPV maneuver if there is recurrent vertigo Keep systolic blood pressure less than 140/80  today's reading 131/86  Follow up with your primary care physician for stroke risk factor modification. lipids with LDL cholesterol goal below 70 mg/dL. Moderate exercise if tolerated  RTC in 1 year.If continued stable will discharge at that time Dennie Bible, [facility], [facility], [facility] [address], [place], [place] [address] [phone]

## 2016-06-22 NOTE — Progress Notes (Signed)
I reviewed above note and agree with the assessment and plan.  Agree discharge from our service if stable next visit. Thanks.  Marvel Plan, MD PhD Stroke Neurology 06/22/2016 6:26 PM

## 2016-06-22 NOTE — Patient Instructions (Addendum)
continue pradaxa and pravachol for stroke prevention continue to follow up with cardiology for intermittent afib and pacemaker management BPPV maneuver if there is recurrent vertigo Keep systolic blood pressure less than 140/80  today's reading 131/86  Follow up with your primary care physician for stroke risk factor modification. lipids with LDL cholesterol goal below 70 mg/dL. Moderate exercise if tolerated  RTC in 1 year.

## 2016-06-30 ENCOUNTER — Other Ambulatory Visit: Payer: Self-pay | Admitting: Vascular Surgery

## 2016-06-30 DIAGNOSIS — I714 Abdominal aortic aneurysm, without rupture, unspecified: Secondary | ICD-10-CM

## 2016-07-04 ENCOUNTER — Ambulatory Visit: Payer: Medicare Other | Admitting: Neurology

## 2016-07-10 ENCOUNTER — Other Ambulatory Visit: Payer: Medicare Other | Admitting: *Deleted

## 2016-07-10 DIAGNOSIS — I4821 Permanent atrial fibrillation: Secondary | ICD-10-CM

## 2016-07-10 LAB — CBC WITH DIFFERENTIAL/PLATELET
BASOS ABS: 73 {cells}/uL (ref 0–200)
BASOS PCT: 1 %
EOS PCT: 4 %
Eosinophils Absolute: 292 cells/uL (ref 15–500)
HCT: 37.9 % — ABNORMAL LOW (ref 38.5–50.0)
HEMOGLOBIN: 12.2 g/dL — AB (ref 13.2–17.1)
LYMPHS ABS: 1314 {cells}/uL (ref 850–3900)
Lymphocytes Relative: 18 %
MCH: 28.6 pg (ref 27.0–33.0)
MCHC: 32.2 g/dL (ref 32.0–36.0)
MCV: 88.8 fL (ref 80.0–100.0)
MONOS PCT: 10 %
MPV: 10.1 fL (ref 7.5–12.5)
Monocytes Absolute: 730 cells/uL (ref 200–950)
NEUTROS ABS: 4891 {cells}/uL (ref 1500–7800)
Neutrophils Relative %: 67 %
PLATELETS: 278 10*3/uL (ref 140–400)
RBC: 4.27 MIL/uL (ref 4.20–5.80)
RDW: 16.2 % — ABNORMAL HIGH (ref 11.0–15.0)
WBC: 7.3 10*3/uL (ref 3.8–10.8)

## 2016-07-10 LAB — BASIC METABOLIC PANEL
BUN: 33 mg/dL — ABNORMAL HIGH (ref 7–25)
CALCIUM: 9 mg/dL (ref 8.6–10.3)
CHLORIDE: 112 mmol/L — AB (ref 98–110)
CO2: 21 mmol/L (ref 20–31)
Creat: 1.72 mg/dL — ABNORMAL HIGH (ref 0.70–1.18)
Glucose, Bld: 115 mg/dL — ABNORMAL HIGH (ref 65–99)
Potassium: 5.1 mmol/L (ref 3.5–5.3)
SODIUM: 143 mmol/L (ref 135–146)

## 2016-07-12 ENCOUNTER — Telehealth: Payer: Self-pay | Admitting: Cardiology

## 2016-07-12 ENCOUNTER — Ambulatory Visit (INDEPENDENT_AMBULATORY_CARE_PROVIDER_SITE_OTHER): Payer: Medicare Other

## 2016-07-12 DIAGNOSIS — Z95810 Presence of automatic (implantable) cardiac defibrillator: Secondary | ICD-10-CM

## 2016-07-12 DIAGNOSIS — I5022 Chronic systolic (congestive) heart failure: Secondary | ICD-10-CM

## 2016-07-12 NOTE — Telephone Encounter (Signed)
LMOVM reminding pt to send remote transmission.   

## 2016-07-13 ENCOUNTER — Telehealth: Payer: Self-pay

## 2016-07-13 NOTE — Progress Notes (Signed)
EPIC Encounter for ICM Monitoring  Patient Name: [name] [name] is a 75 y.o. male Date: 07/13/2016 Primary Care Physican: [name], MD Primary Cardiologist: [name] Electrophysiologist: [name] Dry Weight:  unknown Bi-V Pacing:  21%  AT/AF Burden > 99% (was 5.6% on 10/14/2015)        Heart Failure questions reviewed, pt asymptomatic.  Spoke with patient today to request updated transmission and he is out of town.   Reviewed remote sent on 07/10/2016 for unrelated reasons and ICM remote was scheduled for 07/12/2016.  Patient unable to send on scheduled date because he is out of town.    Thoracic impedance abnormal suggesting fluid accumulation since 06/24/2016.     LABS: 07/10/2016 Creatinine 1.72, BUN 33, Potassium 5.1, Sodium 143 06/02/2016 Creatinine 1.44, BUN 20, Potassium 3.0, Sodium 144 05/18/2016 Creatinine 1.07, BUN 14, Potassium 3.6, Sodium 144 05/10/2016 Creatinine 1.01, BUN 17, Potassium 4.1, Sodium 142 04/18/2016 Creatinine 1.38, BUN 18, Potassium 3.6, Sodium 144 04/05/2016 Creatinine 1.32, BUN 13, Potassium 3.2, Sodium 145 03/28/2016 Creatinine 1.11, BUN 13, Potassium 3.9, Sodium 143  Recommendations:  Advised would send to Dr [name] and Dr [name] for review of 07/10/2016 trannsmission.  Will repeat transmission on 07/17/2016.  ICM trend: 07/10/2016       Follow-up plan: ICM clinic phone appointment on 07/17/2016.  Copy of ICM check sent to primary cardiologist and device physician.   Karie Soda, RN 07/13/2016 9:20 AM

## 2016-07-13 NOTE — Telephone Encounter (Signed)
Attempted patient call and left detailed message to send updated transmission today due the last one received on 07/10/2016 showed some fluid accumulation.  Provided call back number.

## 2016-07-17 ENCOUNTER — Ambulatory Visit (INDEPENDENT_AMBULATORY_CARE_PROVIDER_SITE_OTHER): Payer: Medicare Other

## 2016-07-17 ENCOUNTER — Other Ambulatory Visit: Payer: Self-pay | Admitting: Nurse Practitioner

## 2016-07-17 DIAGNOSIS — I5022 Chronic systolic (congestive) heart failure: Secondary | ICD-10-CM | POA: Diagnosis not present

## 2016-07-17 DIAGNOSIS — Z95810 Presence of automatic (implantable) cardiac defibrillator: Secondary | ICD-10-CM

## 2016-07-17 NOTE — Progress Notes (Signed)
EPIC Encounter for ICM Monitoring  Patient Name: [name] [name] is a 75 y.o. male Date: 07/17/2016 Primary Care Physican: [name], MD Primary Cardiologist: [name] Electrophysiologist: [name] Dry Weight:  unknown Bi-V Pacing:  17%  AT/AF Burden >99%       Heart Failure questions reviewed, pt symptomatic with fatigue and tiredness.  [place] home 2 days early due to not feeling very well.   Thoracic impedance abnormal suggesting fluid accumulation since 06/25/2016.  LABS: 07/10/2016 Creatinine 1.72, BUN 33, Potassium 5.1, Sodium 143 (Dr [name] advised patient to hold Lasix x 3 days at time of this lab) 06/02/2016 Creatinine 1.44, BUN 20, Potassium 3.0, Sodium 144 05/18/2016 Creatinine 1.07, BUN 14, Potassium 3.6, Sodium 144 05/10/2016 Creatinine 1.01, BUN 17, Potassium 4.1, Sodium 142 04/18/2016 Creatinine 1.38, BUN 18, Potassium 3.6, Sodium 144 04/05/2016 Creatinine 1.32, BUN 13, Potassium 3.2, Sodium 145 03/28/2016 Creatinine 1.11, BUN 13, Potassium 3.9, Sodium 143  Recommendations:  Send copy to Dr [name] for recommendation.   Patient scheduled for TEE on 07/19/2016.  Recheck fluid levels and next ICM remote transmission on 07/24/2016  ICM trend: 07/17/2016        Follow-up plan: ICM clinic phone appointment on 07/24/2016.  Copy of ICM check sent to primary cardiologist and device physician.   Karie Soda, RN 07/17/2016 12:20 PM

## 2016-07-18 ENCOUNTER — Encounter (HOSPITAL_COMMUNITY): Payer: Self-pay

## 2016-07-18 ENCOUNTER — Encounter (HOSPITAL_COMMUNITY)
Admission: RE | Disposition: A | Discharge status: Home / Self Care | Payer: Self-pay | Source: Ambulatory Visit | Attending: Cardiology

## 2016-07-18 ENCOUNTER — Encounter (HOSPITAL_COMMUNITY): Payer: Self-pay | Admitting: Certified Registered Nurse Anesthetist

## 2016-07-18 ENCOUNTER — Ambulatory Visit (HOSPITAL_COMMUNITY)
Admission: RE | Admit: 2016-07-18 | Discharge: 2016-07-18 | Disposition: A | Discharge status: Home / Self Care | Payer: Medicare Other | Source: Ambulatory Visit | Attending: Cardiology | Admitting: Cardiology

## 2016-07-18 DIAGNOSIS — Z86718 Personal history of other venous thrombosis and embolism: Secondary | ICD-10-CM | POA: Diagnosis not present

## 2016-07-18 DIAGNOSIS — Z8673 Personal history of transient ischemic attack (TIA), and cerebral infarction without residual deficits: Secondary | ICD-10-CM | POA: Diagnosis not present

## 2016-07-18 DIAGNOSIS — I481 Persistent atrial fibrillation: Secondary | ICD-10-CM | POA: Insufficient documentation

## 2016-07-18 DIAGNOSIS — E785 Hyperlipidemia, unspecified: Secondary | ICD-10-CM | POA: Diagnosis not present

## 2016-07-18 DIAGNOSIS — I5022 Chronic systolic (congestive) heart failure: Secondary | ICD-10-CM | POA: Insufficient documentation

## 2016-07-18 DIAGNOSIS — I480 Paroxysmal atrial fibrillation: Secondary | ICD-10-CM | POA: Insufficient documentation

## 2016-07-18 DIAGNOSIS — Z5309 Procedure and treatment not carried out because of other contraindication: Secondary | ICD-10-CM | POA: Insufficient documentation

## 2016-07-18 DIAGNOSIS — Z7982 Long term (current) use of aspirin: Secondary | ICD-10-CM | POA: Insufficient documentation

## 2016-07-18 DIAGNOSIS — N189 Chronic kidney disease, unspecified: Secondary | ICD-10-CM | POA: Diagnosis not present

## 2016-07-18 DIAGNOSIS — I130 Hypertensive heart and chronic kidney disease with heart failure and stage 1 through stage 4 chronic kidney disease, or unspecified chronic kidney disease: Secondary | ICD-10-CM | POA: Insufficient documentation

## 2016-07-18 DIAGNOSIS — Z09 Encounter for follow-up examination after completed treatment for conditions other than malignant neoplasm: Secondary | ICD-10-CM | POA: Diagnosis present

## 2016-07-18 DIAGNOSIS — J449 Chronic obstructive pulmonary disease, unspecified: Secondary | ICD-10-CM | POA: Diagnosis not present

## 2016-07-18 DIAGNOSIS — F329 Major depressive disorder, single episode, unspecified: Secondary | ICD-10-CM | POA: Insufficient documentation

## 2016-07-18 DIAGNOSIS — Z79899 Other long term (current) drug therapy: Secondary | ICD-10-CM | POA: Diagnosis not present

## 2016-07-18 DIAGNOSIS — I255 Ischemic cardiomyopathy: Secondary | ICD-10-CM | POA: Diagnosis not present

## 2016-07-18 DIAGNOSIS — Z951 Presence of aortocoronary bypass graft: Secondary | ICD-10-CM | POA: Diagnosis not present

## 2016-07-18 DIAGNOSIS — I252 Old myocardial infarction: Secondary | ICD-10-CM | POA: Insufficient documentation

## 2016-07-18 DIAGNOSIS — Z7902 Long term (current) use of antithrombotics/antiplatelets: Secondary | ICD-10-CM | POA: Insufficient documentation

## 2016-07-18 DIAGNOSIS — F17210 Nicotine dependence, cigarettes, uncomplicated: Secondary | ICD-10-CM | POA: Insufficient documentation

## 2016-07-18 DIAGNOSIS — Z95810 Presence of automatic (implantable) cardiac defibrillator: Secondary | ICD-10-CM | POA: Insufficient documentation

## 2016-07-18 DIAGNOSIS — I2510 Atherosclerotic heart disease of native coronary artery without angina pectoris: Secondary | ICD-10-CM | POA: Diagnosis not present

## 2016-07-18 SURGERY — CANCELLED PROCEDURE

## 2016-07-18 MED ORDER — SODIUM CHLORIDE 0.9 % IV SOLN: INTRAVENOUS | Status: DC

## 2016-07-18 NOTE — Progress Notes (Signed)
Patient presents to endoscopy today for TEE/Cardioversion reports stopping pradaxa 07/14/16. Call placed to Magdalene Molly, NP to report this. Per [facility], procedure cancelled at this time. Patient needs to restart pradaxa as ordered and can reschedule procedure for 07/26/16 or 07/27/16. Patient informed of this, he reports understanding and agrees. TEE/cardioversion rescheduled for 07/26/16 per patient request. Patient expresses understanding of need to take pradaxa as ordered, not to miss any doses and to take any dose due in AM on day of procedure

## 2016-07-18 NOTE — H&P (Addendum)
[name] [name]  06/07/2016 3:00 PM  Office Visit  MRN:  [id number]  Description: Male DOB: [birth year] Provider: [name], MD Department: [facility] [facility]  Vitals   BP  132/72   Pulse  76   Ht   (1.854 m)   Wt  190 lb 9.6 oz (86.5 kg)   BMI  25.15 kg/m      Vitals History  Progress Notes   [name], MD at 06/06/2016 1:21 PM   Status: Signed       Electrophysiology Office Note Date: 06/07/2016  ID:  [name] [name], DOB 07/05/1941, MRN [id number]  PCP: [name], MD Primary Cardiologist: [name] Electrophysiologist: [name]  CC: follow up [name] [name] is a 75 y.o. male seen today post Watchman implant.  Since discharge, the patient reports doing reasonably well.  He has returned to AF after discharge.  He reports compliance with medications today. He has not had procedural related complications.  He denies chest pain, palpitations, dyspnea, PND, orthopnea, nausea, vomiting, dizziness, syncope, edema, or early satiety.  He has not had ICD shocks. He has had no further GI bleeding that he is aware of.  Device History: STJ CRTD implanted 2013 for ICM, chronic systolic heart failure History of appropriate therapy: No History of AAD therapy: Yes - amiodarone for AF         Past Medical History  Diagnosis Date  . PAF (paroxysmal atrial fibrillation) (HCC)     a. on amio and pradaxa.   [name] CAD (coronary artery disease)     a. s/p CABG;  cath 10/07:  LM ok, LAD occluded, AV CFX 95%, pRCA occluded; L-LAD, S-OM2/OM3, S-PDA ok  . Cerebrovascular disease     a. s/p prior Left CEA;  followed by Dr. [name]  . COPD (chronic obstructive pulmonary disease) (HCC)   . Hyperlipidemia   . Hypertension   . Nephrolithiasis   . Ischemic cardiomyopathy     a. EF 25% by last myoview in 11/2015. s/p CRT-D  . Iliac artery aneurysm, left (HCC)     followed by Dr. [name]  . Aortic dissection (HCC)     H/O focal aortic  dissection  . LBBB (left bundle branch block)   . CKD (chronic kidney disease)   . DVT (deep venous thrombosis) (HCC)   . AICD (automatic cardioverter/defibrillator) present   . CHF (congestive heart failure) (HCC)   . Myocardial infarction (HCC) 1997  . Anginal pain (HCC)   . History of stomach ulcers   . Stroke (HCC) 12/2015 "several mini"    a. on coumadin --> switched to pradaxa  . Depression          Past Surgical History  Procedure Laterality Date  . Cardiac catheterization  1998; 2013  . Coronary artery bypass graft  1998    CABG X5  . Appendectomy  09/11/11  . Bi-ventricular implantable cardioverter defibrillator  (crt-d)  08/22/2012    SJM Quadra Assura BiV ICD implanted by Dr [name]  . Bi-ventricular implantable cardioverter defibrillator N/A 08/22/2012    Procedure: BI-VENTRICULAR IMPLANTABLE CARDIOVERTER DEFIBRILLATOR  (CRT-D);  Surgeon: [name], MD;  Location[facility] CATH LAB;  Service: Cardiovascular;  Laterality: N/A;  . Carotid endarterectomy Left July  2005  . Abdominal aortic aneurysm repair N/A 01/03/2016    Procedure: ANEURYSM ABDOMINAL AORTA, BILATERAL COMMON ILIAC REPAIR;  Surgeon: [name], MD;  Location: [facility] OR;  Service: Vascular;  Laterality: N/A;  . Esophagogastroduodenoscopy N/A 02/28/2016  Procedure: ESOPHAGOGASTRODUODENOSCOPY (EGD);  Surgeon: [name], MD;  Location: [facility] ENDOSCOPY;  Service: Endoscopy;  Laterality: N/A;  . Cardiac catheterization  06/01/2016    atrial appendage occlusion  . Posterior fusion lumbar spine  Sep 27, 2014    L4-L5  . Back surgery    . Cystoscopy w/ stone manipulation  X 2  . Left atrial appendage occlusion N/A 06/01/2016    Procedure: LEFT ATRIAL APPENDAGE OCCLUSION;  Surgeon: [name], MD;  Location: [facility] INVASIVE CV LAB;  Service: Cardiovascular;  Laterality: N/A;          Current Outpatient Prescriptions  Medication Sig Dispense Refill  . albuterol (PROVENTIL  HFA;VENTOLIN HFA) 108 (90 BASE) MCG/ACT inhaler Inhale 2 puffs into the lungs every 4 (four) hours as needed for wheezing. 1 Inhaler 0  . amiodarone (PACERONE) 200 MG tablet Take 1 tablet (200 mg total) by mouth 2 (two) times daily. 60 tablet 11  . amLODipine (NORVASC) 10 MG tablet Take 10 mg by mouth daily.    [name] aspirin EC 81 MG tablet Take 1 tablet (81 mg total) by mouth daily.    . citalopram (CELEXA) 10 MG tablet Take 10 mg by mouth daily. Reported on 06/06/2016    . dabigatran (PRADAXA) 150 MG CAPS capsule Take 1 capsule (150 mg total) by mouth 2 (two) times daily. 180 capsule 3  . fluconazole (DIFLUCAN) 100 MG tablet Take 1 tablet (100 mg total) by mouth daily. 7 tablet 0  . furosemide (LASIX) 20 MG tablet Take 3 tablets (60 mg total) by mouth daily. 90 tablet 11  . ibuprofen (ADVIL,MOTRIN) 200 MG tablet Take 400 mg by mouth every 6 (six) hours as needed (For pain.).    [name] ipratropium-albuterol (DUONEB) 0.5-2.5 (3) MG/3ML SOLN Take 3 mLs by nebulization every 4 (four) hours as needed (for shortness of breath). 360 mL 5  . lisinopril (PRINIVIL,ZESTRIL) 10 MG tablet Take 1 tablet (10 mg total) by mouth daily. 90 tablet 3  . metoprolol succinate (TOPROL-XL) 50 MG 24 hr tablet Take 1 tablet (50 mg total) by mouth daily. Please schedule appointment for refills. 90 tablet 3  . Multiple Vitamin (MULITIVITAMIN WITH MINERALS) TABS Take 1 tablet by mouth daily.    . niacin (NIASPAN) 500 MG CR tablet Take 1,000 mg by mouth 2 (two) times daily.     . nitroGLYCERIN (NITROSTAT) 0.4 MG SL tablet Place 1 tablet (0.4 mg total) under the tongue every 5 (five) minutes as needed for chest pain. 100 tablet 3  . pantoprazole (PROTONIX) 40 MG tablet Take 1 tablet (40 mg total) by mouth 2 (two) times daily. 60 tablet 0  . potassium chloride SA (K-DUR,KLOR-CON) 20 MEQ tablet Take 80 mEq by mouth 2 (two) times daily.     . pravastatin (PRAVACHOL) 80 MG tablet Take 1 tablet (80 mg total) by mouth daily. 90  tablet 2  . tiotropium (SPIRIVA HANDIHALER) 18 MCG inhalation capsule Place 1 capsule (18 mcg total) into inhaler and inhale daily. 30 capsule 2   No current facility-administered medications for this visit.    Allergies:   Buprenorphine hcl and Morphine and related   Social History: Social History        Social History  . Marital Status: Widowed    Spouse Name: N/A  . Number of Children: 3  . Years of Education: 10 TH       Occupational History  . RETIRED---truck driver          Social  History Main Topics  . Smoking status: Current Every Day Smoker -- 0.50 packs/day for 48 years    Types: Cigarettes    Last Attempt to Quit: 12/11/2004  . Smokeless tobacco: Never Used  . Alcohol Use: 0.0 oz/week    0 Standard drinks or equivalent per week     Comment: 06/01/2016 "haven't had a drink in years"  . Drug Use: No  . Sexual Activity: Yes       Other Topics Concern  . Not on file      Social History Narrative   Patient is married with 3 children.   Patient is right handed.   Patient has 10 th grade education.   Patient drinks tea occasionally.    Family History:      Family History  Problem Relation Age of Onset  . Stroke Maternal Uncle   . Heart attack Neg Hx   . Hypertension Neg Hx   . Stroke Maternal Uncle     Review of Systems: All other systems reviewed and are otherwise negative except as noted above.   Physical Exam: VS:  BP 132/72 mmHg  Pulse 76  Ht 6\' 1"  (1.854 m)  Wt 190 lb 9.6 oz (86.456 kg)  BMI 25.15 kg/m2 , BMI Body mass index is 25.15 kg/(m^2).  GEN- The patient is elderly appearing, alert and oriented x 3 today.   HEENT: normocephalic, atraumatic; sclera clear, conjunctiva pink; hearing intact; oropharynx clear; neck supple  Lungs- Clear to ausculation bilaterally, normal work of breathing.  No wheezes, rales, rhonchi Heart- Irregular rate and rhythm, no murmurs, rubs or gallops  GI- soft, non-tender,  non-distended, bowel sounds present  Extremities- no clubbing, cyanosis, or edema; DP/PT/radial pulses 2+ bilaterally MS- no significant deformity or atrophy Skin- warm and dry, no rash or lesion; ICD pocket well healed Psych- euthymic mood, full affect Neuro- strength and sensation are intact  ICD interrogation- reviewed in detail today,  See PACEART report  EKG:  EKG is ordered today. EKG today showed AF with controlled V rate, LBBB  Recent Labs: 03/15/2016: Magnesium 1.7 03/28/2016: Brain Natriuretic Peptide 1794.7* 05/10/2016: ALT 9; TSH 0.84 05/18/2016: Hemoglobin 13.6; Platelets 191 06/02/2016: BUN 20; Creatinine, Ser 1.44*; Potassium 3.0*; Sodium 144      Wt Readings from Last 3 Encounters:  06/07/16 190 lb 9.6 oz (86.456 kg)  06/02/16 188 lb 11.4 oz (85.6 kg)  05/18/16 192 lb 3.2 oz (87.181 kg)     Other studies Reviewed: Additional studies/ records that were reviewed today include: Dr Jenel LucksAllred's office notes, hospital records  Assessment and Plan:  1.  Persistent atrial fibrillation Doing well s/p Watchman and cardioversion last week, but now back in AF with decreased CRT pacing Continue Pradaxa and ASA 81mg  daily for now - importance of compliance discussed today TEE at 6 weeks to evaluate device placement and leaks (will schedule with Dr Jens Somrenshaw as he knows the patient well) Will continue amiodarone at increased dose until 6 week TEE and do cardioversion again at that time.  If fails cardioversion then, would discontinue amiodarone and consider AVN ablation  2. Chronic systolic dysfunction Euvolemic on exam  Stable on an appropriate medical regimen Normal ICD function  See Pace Art report No changes today  3.  HTN Stable No change required today  Current medicines are reviewed at length with the patient today.   The patient does not have concerns regarding his medicines.  The following changes were made today:  none  Labs/ tests ordered  today include:  pre-procedure labs     Orders Placed This Encounter  Procedures  . Basic metabolic panel  . CBC with Differential  . EKG 12-Lead    Disposition:   Follow up with EP NP after 6 week TEE  Signed, [name], MD  06/07/2016 4:50 PM  [facility] [address] [place] [address] [phone] (office) [phone] (fax)     For TEE/DCCV. However, patient not taking pradaxa; will resume and reschedule procedure. Olga Millers

## 2016-07-24 ENCOUNTER — Ambulatory Visit (INDEPENDENT_AMBULATORY_CARE_PROVIDER_SITE_OTHER): Payer: Medicare Other

## 2016-07-24 DIAGNOSIS — I5022 Chronic systolic (congestive) heart failure: Secondary | ICD-10-CM

## 2016-07-24 DIAGNOSIS — Z95810 Presence of automatic (implantable) cardiac defibrillator: Secondary | ICD-10-CM

## 2016-07-24 NOTE — Progress Notes (Signed)
EPIC Encounter for ICM Monitoring  Patient Name: [name] [name] is a 75 y.o. male Date: 07/24/2016 Primary Care Physican: [name], MD Primary Cardiologist: [name] Electrophysiologist: [name] Dry Weight: 180 lb  Bi-V Pacing:  15%  AT/AF Burden >99%                   Heart Failure questions reviewed, pt asymptomatic.  Patient scheduled for TEE and Cardioversion on 07/26/2016.   Thoracic impedance continues to be abnormal suggesting fluid accumulation.  LABS: 07/10/2016 Creatinine 1.72, BUN 33, Potassium 5.1, Sodium 143 (Dr [name] advised patient to hold Lasix x 3 days at time of this lab) 06/02/2016 Creatinine 1.44, BUN 20, Potassium 3.0, Sodium 144 05/18/2016 Creatinine 1.07, BUN 14, Potassium 3.6, Sodium 144 05/10/2016 Creatinine 1.01, BUN 17, Potassium 4.1, Sodium 142 04/18/2016 Creatinine 1.38, BUN 18, Potassium 3.6, Sodium 144 04/05/2016 Creatinine 1.32, BUN 13, Potassium 3.2, Sodium 145 03/28/2016 Creatinine 1.11, BUN 13, Potassium 3.9, Sodium 143  Recommendations:  Advised to follow low sodium diet.   Follow-up plan: ICM clinic phone appointment on 09/12/2016.  Office appointment with Dr [name] on 08/07/2016 and Gypsy Balsam, NP on 08/09/2016.    Copy of ICM check sent to primary cardiologist and device physician.   ICM trend: 07/24/2016       Karie Soda, RN 07/24/2016 2:33 PM

## 2016-07-26 ENCOUNTER — Ambulatory Visit (HOSPITAL_BASED_OUTPATIENT_CLINIC_OR_DEPARTMENT_OTHER): Payer: Medicare Other

## 2016-07-26 ENCOUNTER — Encounter (HOSPITAL_COMMUNITY): Payer: Self-pay

## 2016-07-26 ENCOUNTER — Ambulatory Visit (HOSPITAL_COMMUNITY): Payer: Medicare Other | Admitting: Certified Registered"

## 2016-07-26 ENCOUNTER — Encounter (HOSPITAL_COMMUNITY)
Admission: RE | Disposition: A | Discharge status: Home / Self Care | Payer: Self-pay | Source: Ambulatory Visit | Attending: Cardiology

## 2016-07-26 ENCOUNTER — Ambulatory Visit (HOSPITAL_COMMUNITY)
Admission: RE | Admit: 2016-07-26 | Discharge: 2016-07-26 | Disposition: A | Discharge status: Home / Self Care | Payer: Medicare Other | Source: Ambulatory Visit | Attending: Cardiology | Admitting: Cardiology

## 2016-07-26 DIAGNOSIS — Z7982 Long term (current) use of aspirin: Secondary | ICD-10-CM | POA: Diagnosis not present

## 2016-07-26 DIAGNOSIS — I252 Old myocardial infarction: Secondary | ICD-10-CM | POA: Diagnosis not present

## 2016-07-26 DIAGNOSIS — I340 Nonrheumatic mitral (valve) insufficiency: Secondary | ICD-10-CM | POA: Diagnosis not present

## 2016-07-26 DIAGNOSIS — Z955 Presence of coronary angioplasty implant and graft: Secondary | ICD-10-CM | POA: Diagnosis not present

## 2016-07-26 DIAGNOSIS — F17210 Nicotine dependence, cigarettes, uncomplicated: Secondary | ICD-10-CM | POA: Diagnosis not present

## 2016-07-26 DIAGNOSIS — Z8673 Personal history of transient ischemic attack (TIA), and cerebral infarction without residual deficits: Secondary | ICD-10-CM | POA: Insufficient documentation

## 2016-07-26 DIAGNOSIS — J449 Chronic obstructive pulmonary disease, unspecified: Secondary | ICD-10-CM | POA: Diagnosis not present

## 2016-07-26 DIAGNOSIS — I2510 Atherosclerotic heart disease of native coronary artery without angina pectoris: Secondary | ICD-10-CM | POA: Diagnosis not present

## 2016-07-26 DIAGNOSIS — Z95810 Presence of automatic (implantable) cardiac defibrillator: Secondary | ICD-10-CM | POA: Insufficient documentation

## 2016-07-26 DIAGNOSIS — I480 Paroxysmal atrial fibrillation: Secondary | ICD-10-CM | POA: Diagnosis not present

## 2016-07-26 DIAGNOSIS — I5022 Chronic systolic (congestive) heart failure: Secondary | ICD-10-CM | POA: Diagnosis not present

## 2016-07-26 DIAGNOSIS — I481 Persistent atrial fibrillation: Secondary | ICD-10-CM | POA: Diagnosis not present

## 2016-07-26 DIAGNOSIS — I130 Hypertensive heart and chronic kidney disease with heart failure and stage 1 through stage 4 chronic kidney disease, or unspecified chronic kidney disease: Secondary | ICD-10-CM | POA: Diagnosis not present

## 2016-07-26 DIAGNOSIS — N189 Chronic kidney disease, unspecified: Secondary | ICD-10-CM | POA: Insufficient documentation

## 2016-07-26 DIAGNOSIS — I4891 Unspecified atrial fibrillation: Secondary | ICD-10-CM

## 2016-07-26 HISTORY — PX: CARDIOVERSION: SHX1299

## 2016-07-26 HISTORY — PX: TEE WITHOUT CARDIOVERSION: SHX5443

## 2016-07-26 LAB — POCT I-STAT 4, (NA,K, GLUC, HGB,HCT)
Glucose, Bld: 90 mg/dL (ref 65–99)
HCT: 39 % (ref 39.0–52.0)
Hemoglobin: 13.3 g/dL (ref 13.0–17.0)
Potassium: 5 mmol/L (ref 3.5–5.1)
Sodium: 144 mmol/L (ref 135–145)

## 2016-07-26 SURGERY — ECHOCARDIOGRAM, TRANSESOPHAGEAL
Anesthesia: General

## 2016-07-26 MED ORDER — SODIUM CHLORIDE 0.9 % IV SOLN
INTRAVENOUS | Status: DC | PRN
  Administered 2016-07-26: 13:00:00 via INTRAVENOUS

## 2016-07-26 MED ORDER — PROPOFOL 500 MG/50ML IV EMUL
INTRAVENOUS | Status: DC | PRN
  Administered 2016-07-26: 100 ug/kg/min via INTRAVENOUS

## 2016-07-26 MED ORDER — SODIUM CHLORIDE 0.9 % IV SOLN: INTRAVENOUS | Status: DC

## 2016-07-26 MED ORDER — BUTAMBEN-TETRACAINE-BENZOCAINE 2-2-14 % EX AERO
INHALATION_SPRAY | CUTANEOUS | Status: DC | PRN
  Administered 2016-07-26: 2 via TOPICAL

## 2016-07-26 MED ORDER — ONDANSETRON HCL 4 MG/2ML IJ SOLN: 4.0000 mg | Freq: Once | INTRAMUSCULAR | Status: DC | PRN

## 2016-07-26 MED ORDER — FENTANYL CITRATE (PF) 100 MCG/2ML IJ SOLN: 25.0000 ug | INTRAMUSCULAR | Status: DC | PRN

## 2016-07-26 MED ORDER — PROPOFOL 10 MG/ML IV BOLUS
INTRAVENOUS | Status: DC | PRN
  Administered 2016-07-26 (×3): 20 mg via INTRAVENOUS

## 2016-07-26 NOTE — Anesthesia Preprocedure Evaluation (Addendum)
Anesthesia Evaluation  Patient identified by MRN, date of birth, ID band Patient awake    Reviewed: Allergy & Precautions, NPO status , Patient's Chart, lab work & pertinent test results  Airway Mallampati: II  TM Distance: >3 FB Neck ROM: Full    Dental   Pulmonary Current Smoker,    breath sounds clear to auscultation       Cardiovascular hypertension,  Rhythm:Irregular Rate:Normal     Neuro/Psych    GI/Hepatic   Endo/Other    Renal/GU      Musculoskeletal   Abdominal   Peds  Hematology   Anesthesia Other Findings   Reproductive/Obstetrics                             Anesthesia Physical Anesthesia Plan  ASA: III  Anesthesia Plan: MAC   Post-op Pain Management:    Induction: Intravenous  Airway Management Planned: Natural Airway and Nasal Cannula  Additional Equipment:   Intra-op Plan:   Post-operative Plan:   Informed Consent: I have reviewed the patients History and Physical, chart, labs and discussed the procedure including the risks, benefits and alternatives for the proposed anesthesia with the patient or authorized representative who has indicated his/her understanding and acceptance.     Plan Discussed with: CRNA and Anesthesiologist  Anesthesia Plan Comments:        Anesthesia Quick Evaluation

## 2016-07-26 NOTE — CV Procedure (Signed)
     Transesophageal Echocardiogram Note  [name] [name] [id number] 1941/10/31  Procedure: Transesophageal Echocardiogram Indications: atrial fibrillation   Procedure Details Consent: Obtained Time Out: Verified patient identification, verified procedure, site/side was marked, verified correct patient position, special equipment/implants available, Radiology Safety Procedures followed,  medications/allergies/relevent history reviewed, required imaging and test results available.  Performed The patient received IV propofol 50 mcg and iv lidocain 20 mg for sedation.    This was a follow up TEE post Watchman device implantation and also a TEE prior to a cardioversion.   A 30 mm Watchman device is sitting well in the LAA. There is no thrombus seen on the Watchman device or in the LAA. There is a residual lobe that was not completely covered by the device with vena contracta measuring 3.1 mm. There is no pericardial effusion.  TEE was followed by a successful cardioversion.  Complications: No apparent complications Patient did tolerate procedure well.  Tobias Alexander, MD, [facility] 07/26/2016, 3:11 PM     Cardioversion Note  [name] [name] [id number] 1941-08-29  Procedure: DC Cardioversion Indications: atrial fibrillation  Procedure Details Consent: Obtained Time Out: Verified patient identification, verified procedure, site/side was marked, verified correct patient position, special equipment/implants available, Radiology Safety Procedures followed,  medications/allergies/relevent history reviewed, required imaging and test results available.  Performed  The patient has been on adequate anticoagulation.  The patient received IV propofol 50 mcg and iv lidocain 20 mg for sedation.  Synchronous cardioversion was performed at 120 and 150 joules.  The cardioversion was successful. The cardioversion was followed by a PM/ICD interrogation.    Complications: No apparent  complications Patient did tolerate procedure well.   Tobias Alexander, MD, [facility] 07/26/2016, 3:11 PM

## 2016-07-26 NOTE — H&P (View-Only) (Signed)
[name]  06/07/2016 3:00 PM  Office Visit  MRN:  [id number]  Description: Male DOB: [birth year] Provider: [name], MD Department: [facility] [facility]  Vitals   BP  132/72   Pulse  76   Ht   (1.854 m)   Wt  190 lb 9.6 oz (86.5 kg)   BMI  25.15 kg/m      Vitals History  Progress Notes   [name], MD at 06/06/2016 1:21 PM   Status: Signed       Electrophysiology Office Note Date: 06/07/2016  ID:  [name] [name], DOB 07/05/1941, MRN [id number]  PCP: [name] Rossetti, MD Primary Cardiologist: [name] Electrophysiologist: [name]  CC: follow up [name] [name] is a 75 y.o. male seen today post Watchman implant.  Since discharge, the patient reports doing reasonably well.  He has returned to AF after discharge.  He reports compliance with medications today. He has not had procedural related complications.  He denies chest pain, palpitations, dyspnea, PND, orthopnea, nausea, vomiting, dizziness, syncope, edema, or early satiety.  He has not had ICD shocks. He has had no further GI bleeding that he is aware of.  Device History: STJ CRTD implanted 2013 for ICM, chronic systolic heart failure History of appropriate therapy: No History of AAD therapy: Yes - amiodarone for AF         Past Medical History  Diagnosis Date  . PAF (paroxysmal atrial fibrillation) (HCC)     a. on amio and pradaxa.   [name] CAD (coronary artery disease)     a. s/p CABG;  cath 10/07:  LM ok, LAD occluded, AV CFX 95%, pRCA occluded; L-LAD, S-OM2/OM3, S-PDA ok  . Cerebrovascular disease     a. s/p prior Left CEA;  followed by Dr. [name]  . COPD (chronic obstructive pulmonary disease) (HCC)   . Hyperlipidemia   . Hypertension   . Nephrolithiasis   . Ischemic cardiomyopathy     a. EF 25% by last myoview in 11/2015. s/p CRT-D  . Iliac artery aneurysm, left (HCC)     followed by Dr. [name]  . Aortic dissection (HCC)     H/O focal aortic  dissection  . LBBB (left bundle branch block)   . CKD (chronic kidney disease)   . DVT (deep venous thrombosis) (HCC)   . AICD (automatic cardioverter/defibrillator) present   . CHF (congestive heart failure) (HCC)   . Myocardial infarction (HCC) 1997  . Anginal pain (HCC)   . History of stomach ulcers   . Stroke (HCC) 12/2015 "several mini"    a. on coumadin --> switched to pradaxa  . Depression          Past Surgical History  Procedure Laterality Date  . Cardiac catheterization  1998; 2013  . Coronary artery bypass graft  1998    CABG X5  . Appendectomy  09/11/11  . Bi-ventricular implantable cardioverter defibrillator  (crt-d)  08/22/2012    SJM Quadra Assura BiV ICD implanted by Dr [name]  . Bi-ventricular implantable cardioverter defibrillator N/A 08/22/2012    Procedure: BI-VENTRICULAR IMPLANTABLE CARDIOVERTER DEFIBRILLATOR  (CRT-D);  Surgeon: [name], MD;  Location[facility] CATH LAB;  Service: Cardiovascular;  Laterality: N/A;  . Carotid endarterectomy Left July  2005  . Abdominal aortic aneurysm repair N/A 01/03/2016    Procedure: ANEURYSM ABDOMINAL AORTA, BILATERAL COMMON ILIAC REPAIR;  Surgeon: [name], MD;  Location: [facility] OR;  Service: Vascular;  Laterality: N/A;  . Esophagogastroduodenoscopy N/A 02/28/2016  Procedure: ESOPHAGOGASTRODUODENOSCOPY (EGD);  Surgeon: [name], MD;  Location: [facility] ENDOSCOPY;  Service: Endoscopy;  Laterality: N/A;  . Cardiac catheterization  06/01/2016    atrial appendage occlusion  . Posterior fusion lumbar spine  Sep 27, 2014    L4-L5  . Back surgery    . Cystoscopy w/ stone manipulation  X 2  . Left atrial appendage occlusion N/A 06/01/2016    Procedure: LEFT ATRIAL APPENDAGE OCCLUSION;  Surgeon: [name], MD;  Location: [facility] INVASIVE CV LAB;  Service: Cardiovascular;  Laterality: N/A;          Current Outpatient Prescriptions  Medication Sig Dispense Refill  . albuterol (PROVENTIL  HFA;VENTOLIN HFA) 108 (90 BASE) MCG/ACT inhaler Inhale 2 puffs into the lungs every 4 (four) hours as needed for wheezing. 1 Inhaler 0  . amiodarone (PACERONE) 200 MG tablet Take 1 tablet (200 mg total) by mouth 2 (two) times daily. 60 tablet 11  . amLODipine (NORVASC) 10 MG tablet Take 10 mg by mouth daily.    [name] aspirin EC 81 MG tablet Take 1 tablet (81 mg total) by mouth daily.    . citalopram (CELEXA) 10 MG tablet Take 10 mg by mouth daily. Reported on 06/06/2016    . dabigatran (PRADAXA) 150 MG CAPS capsule Take 1 capsule (150 mg total) by mouth 2 (two) times daily. 180 capsule 3  . fluconazole (DIFLUCAN) 100 MG tablet Take 1 tablet (100 mg total) by mouth daily. 7 tablet 0  . furosemide (LASIX) 20 MG tablet Take 3 tablets (60 mg total) by mouth daily. 90 tablet 11  . ibuprofen (ADVIL,MOTRIN) 200 MG tablet Take 400 mg by mouth every 6 (six) hours as needed (For pain.).    [name] ipratropium-albuterol (DUONEB) 0.5-2.5 (3) MG/3ML SOLN Take 3 mLs by nebulization every 4 (four) hours as needed (for shortness of breath). 360 mL 5  . lisinopril (PRINIVIL,ZESTRIL) 10 MG tablet Take 1 tablet (10 mg total) by mouth daily. 90 tablet 3  . metoprolol succinate (TOPROL-XL) 50 MG 24 hr tablet Take 1 tablet (50 mg total) by mouth daily. Please schedule appointment for refills. 90 tablet 3  . Multiple Vitamin (MULITIVITAMIN WITH MINERALS) TABS Take 1 tablet by mouth daily.    . niacin (NIASPAN) 500 MG CR tablet Take 1,000 mg by mouth 2 (two) times daily.     . nitroGLYCERIN (NITROSTAT) 0.4 MG SL tablet Place 1 tablet (0.4 mg total) under the tongue every 5 (five) minutes as needed for chest pain. 100 tablet 3  . pantoprazole (PROTONIX) 40 MG tablet Take 1 tablet (40 mg total) by mouth 2 (two) times daily. 60 tablet 0  . potassium chloride SA (K-DUR,KLOR-CON) 20 MEQ tablet Take 80 mEq by mouth 2 (two) times daily.     . pravastatin (PRAVACHOL) 80 MG tablet Take 1 tablet (80 mg total) by mouth daily. 90  tablet 2  . tiotropium (SPIRIVA HANDIHALER) 18 MCG inhalation capsule Place 1 capsule (18 mcg total) into inhaler and inhale daily. 30 capsule 2   No current facility-administered medications for this visit.    Allergies:   Buprenorphine hcl and Morphine and related   Social History: Social History        Social History  . Marital Status: Widowed    Spouse Name: N/A  . Number of Children: 3  . Years of Education: 10 TH       Occupational History  . RETIRED---truck driver          Social  History Main Topics  . Smoking status: Current Every Day Smoker -- 0.50 packs/day for 48 years    Types: Cigarettes    Last Attempt to Quit: 12/11/2004  . Smokeless tobacco: Never Used  . Alcohol Use: 0.0 oz/week    0 Standard drinks or equivalent per week     Comment: 06/01/2016 "haven't had a drink in years"  . Drug Use: No  . Sexual Activity: Yes       Other Topics Concern  . Not on file      Social History Narrative   Patient is married with 3 children.   Patient is right handed.   Patient has 10 th grade education.   Patient drinks tea occasionally.    Family History:      Family History  Problem Relation Age of Onset  . Stroke Maternal Uncle   . Heart attack Neg Hx   . Hypertension Neg Hx   . Stroke Maternal Uncle     Review of Systems: All other systems reviewed and are otherwise negative except as noted above.   Physical Exam: VS:  BP 132/72 mmHg  Pulse 76  Ht 6\' 1"  (1.854 m)  Wt 190 lb 9.6 oz (86.456 kg)  BMI 25.15 kg/m2 , BMI Body mass index is 25.15 kg/(m^2).  GEN- The patient is elderly appearing, alert and oriented x 3 today.   HEENT: normocephalic, atraumatic; sclera clear, conjunctiva pink; hearing intact; oropharynx clear; neck supple  Lungs- Clear to ausculation bilaterally, normal work of breathing.  No wheezes, rales, rhonchi Heart- Irregular rate and rhythm, no murmurs, rubs or gallops  GI- soft, non-tender,  non-distended, bowel sounds present  Extremities- no clubbing, cyanosis, or edema; DP/PT/radial pulses 2+ bilaterally MS- no significant deformity or atrophy Skin- warm and dry, no rash or lesion; ICD pocket well healed Psych- euthymic mood, full affect Neuro- strength and sensation are intact  ICD interrogation- reviewed in detail today,  See PACEART report  EKG:  EKG is ordered today. EKG today showed AF with controlled V rate, LBBB  Recent Labs: 03/15/2016: Magnesium 1.7 03/28/2016: Brain Natriuretic Peptide 1794.7* 05/10/2016: ALT 9; TSH 0.84 05/18/2016: Hemoglobin 13.6; Platelets 191 06/02/2016: BUN 20; Creatinine, Ser 1.44*; Potassium 3.0*; Sodium 144      Wt Readings from Last 3 Encounters:  06/07/16 190 lb 9.6 oz (86.456 kg)  06/02/16 188 lb 11.4 oz (85.6 kg)  05/18/16 192 lb 3.2 oz (87.181 kg)     Other studies Reviewed: Additional studies/ records that were reviewed today include: Dr Jenel LucksAllred's office notes, hospital records  Assessment and Plan:  1.  Persistent atrial fibrillation Doing well s/p Watchman and cardioversion last week, but now back in AF with decreased CRT pacing Continue Pradaxa and ASA 81mg  daily for now - importance of compliance discussed today TEE at 6 weeks to evaluate device placement and leaks (will schedule with Dr Jens Somrenshaw as he knows the patient well) Will continue amiodarone at increased dose until 6 week TEE and do cardioversion again at that time.  If fails cardioversion then, would discontinue amiodarone and consider AVN ablation  2. Chronic systolic dysfunction Euvolemic on exam  Stable on an appropriate medical regimen Normal ICD function  See Pace Art report No changes today  3.  HTN Stable No change required today  Current medicines are reviewed at length with the patient today.   The patient does not have concerns regarding his medicines.  The following changes were made today:  none  Labs/ tests ordered  today include:  pre-procedure labs     Orders Placed This Encounter  Procedures  . Basic metabolic panel  . CBC with Differential  . EKG 12-Lead    Disposition:   Follow up with EP NP after 6 week TEE  Signed, [name], MD  06/07/2016 4:50 PM  [facility] [address] [place] [address] [phone] (office) [phone] (fax)     For TEE/DCCV. However, patient not taking pradaxa; will resume and reschedule procedure. Olga Millers

## 2016-07-26 NOTE — Anesthesia Postprocedure Evaluation (Signed)
Anesthesia Post Note  Patient: [name] [name]  Procedure(s) Performed: Procedure(s) (LRB): TRANSESOPHAGEAL ECHOCARDIOGRAM (TEE) (N/A) CARDIOVERSION (N/A)  Patient location during evaluation: PACU Anesthesia Type: General Level of consciousness: awake, awake and alert and oriented Pain management: pain level controlled Vital Signs Assessment: post-procedure vital signs reviewed and stable Respiratory status: spontaneous breathing, nonlabored ventilation and respiratory function stable Cardiovascular status: blood pressure returned to baseline Anesthetic complications: no    Last Vitals:  Vitals:   07/26/16 1201 07/26/16 1349  BP: (!) 146/93 120/80  Pulse: (!) 108 84  Resp: (!) 23 20    Last Pain:  Vitals:   07/26/16 1201  TempSrc: Oral                 Daniella Dewberry COKER

## 2016-07-26 NOTE — Interval H&P Note (Signed)
History and Physical Interval Note:  07/26/2016 12:51 PM  [name] [name]  has presented today for surgery, with the diagnosis of post watchman, afib  The various methods of treatment have been discussed with the patient and family. After consideration of risks, benefits and other options for treatment, the patient has consented to  Procedure(s): TRANSESOPHAGEAL ECHOCARDIOGRAM (TEE) (N/A) CARDIOVERSION (N/A) as a surgical intervention .  The patient's history has been reviewed, patient examined, no change in status, stable for surgery.  I have reviewed the patient's chart and labs.  Questions were answered to the patient's satisfaction.     Tobias Alexander

## 2016-07-26 NOTE — Transfer of Care (Signed)
Immediate Anesthesia Transfer of Care Note  Patient: [name] [name]  Procedure(s) Performed: Procedure(s): TRANSESOPHAGEAL ECHOCARDIOGRAM (TEE) (N/A) CARDIOVERSION (N/A)  Patient Location: Endoscopy Unit  Anesthesia Type:MAC  Level of Consciousness: sedated and patient cooperative  Airway & Oxygen Therapy: Patient Spontanous Breathing and Patient connected to nasal cannula oxygen  Post-op Assessment: Report given to RN, Post -op Vital signs reviewed and stable and Patient moving all extremities  Post vital signs: Reviewed and stable  Last Vitals:  Vitals:   07/26/16 1201  BP: (!) 146/93  Pulse: (!) 108  Resp: (!) 23    Last Pain:  Vitals:   07/26/16 1201  TempSrc: Oral         Complications: No apparent anesthesia complications

## 2016-07-26 NOTE — Discharge Instructions (Signed)
Transesophageal Echocardiogram °Transesophageal echocardiography (TEE) is a picture test of your heart using sound waves. The pictures taken can give very detailed pictures of your heart. This can help your doctor see if there are problems with your heart. TEE can check: °· If your heart has blood clots in it. °· How well your heart valves are working. °· If you have an infection on the inside of your heart. °· Some of the major arteries of your heart. °· If your heart valve is working after a repair. °· Your heart before a procedure that uses a shock to your heart to get the rhythm back to normal. °BEFORE THE PROCEDURE °· Do not eat or drink for 6 hours before the procedure or as told by your doctor. °· Make plans to have someone drive you home after the procedure. Do not drive yourself home. °· An IV tube will be put in your arm. °PROCEDURE °· You will be given a medicine to help you relax (sedative). It will be given through the IV tube. °· A numbing medicine will be sprayed or gargled in the back of your throat to help numb it. °· The tip of the probe is placed into the back of your mouth. You will be asked to swallow. This helps to pass the probe into your esophagus. °· Once the tip of the probe is in the right place, your doctor can take pictures of your heart. °· You may feel pressure at the back of your throat. °AFTER THE PROCEDURE °· You will be taken to a recovery area so the sedative can wear off. °· Your throat may be sore and scratchy. This will go away slowly over time. °· You will go home when you are fully awake and able to swallow liquids. °· You should have someone stay with you for the next 24 hours. °· Do not drive or operate machinery for the next 24 hours. °  °This information is not intended to replace advice given to you by your health care provider. Make sure you discuss any questions you have with your health care provider. °  °Document Released: 09/24/2009 Document Revised: 12/02/2013  Document Reviewed: 05/29/2013 °Elsevier Interactive Patient Education ©2016 [employer] °Electrical Cardioversion, Care After °Refer to this sheet in the next few weeks. These instructions provide you with information on caring for yourself after your procedure. Your health care provider may also give you more specific instructions. Your treatment has been planned according to current medical practices, but problems sometimes occur. Call your health care provider if you have any problems or questions after your procedure. °WHAT TO EXPECT AFTER THE PROCEDURE °After your procedure, it is typical to have the following sensations: °· Some redness on the skin where the shocks were delivered. If this is tender, a sunburn lotion or hydrocortisone cream may help. °· Possible return of an abnormal heart rhythm within hours or days after the procedure. °HOME CARE INSTRUCTIONS °· Take medicines only as directed by your health care provider. Be sure you understand how and when to take your medicine. °· Learn how to feel your pulse and check it often. °· Limit your activity for 48 hours after the procedure or as directed by your health care provider. °· Avoid or minimize caffeine and other stimulants as directed by your health care provider. °SEEK MEDICAL CARE IF: °· You feel like your heart is beating too fast or your pulse is not regular. °· You have any questions about your medicines. °·   You have bleeding that will not stop. °SEEK IMMEDIATE MEDICAL CARE IF: °· You are dizzy or feel faint. °· It is hard to breathe or you feel short of breath. °· There is a change in discomfort in your chest. °· Your speech is slurred or you have trouble moving an arm or leg on one side of your body. °· You get a serious muscle cramp that does not go away. °· Your fingers or toes turn cold or blue. °  °This information is not intended to replace advice given to you by your health care provider. Make sure you discuss any questions you have  with your health care provider. °  °Document Released: 09/17/2013 Document Revised: 12/18/2014 Document Reviewed: 09/17/2013 °Elsevier Interactive Patient Education ©2016 [employer] ° °

## 2016-07-29 ENCOUNTER — Other Ambulatory Visit: Payer: Self-pay | Admitting: Cardiology

## 2016-07-30 ENCOUNTER — Encounter (HOSPITAL_COMMUNITY): Payer: Self-pay | Admitting: Cardiology

## 2016-07-31 ENCOUNTER — Other Ambulatory Visit: Payer: Self-pay

## 2016-08-02 ENCOUNTER — Encounter: Payer: Medicare Other | Admitting: Physician Assistant

## 2016-08-03 NOTE — Progress Notes (Signed)
HPI: Fu CAD s/p CABG, ischemic CM, combined systolic and diastolic CHF, PAF, COPD, s/p CRT-D. LHC in 2013 with patent grafts. He also has a history of focal aortic dissection being managed medically. He also has a hx of CEA. He underwent aortic and iliac artery aneurysm repair by Dr. Arbie CookeyEarly in 1/17 with a aorta to bilateral CIA bypass. Admitted in 2/17 with hematemesis 2/2 MW tear. He was seen by Dr. Johney FrameAllred and considered a good candidate for Watchman device implant (placed 6/17). Had TEE 8/17; residual lobe not covered by occlusion device; had DCCV.  Since last seen, He denies dyspnea, chest pain, palpitations or syncope.  Myoview 12/16 EF 25%, inf scar, no ischemia, Intermediate Risk due to reduced EF  Carotid US 11/16 R < 40%; L CEA patent with < 40%  TEE 8/17 EF 25-30; mild to moderate MR; watchman device in place with residual lobe not covered by device   LHC 8/13 Left mainstem: LM normal.  Left anterior descending (LAD): 100 proximal. LAD fills via LIMA. No high grade distal disease. D1 moderated sized and fills via SVG. Left circumflex (LCx): AV groove diffuse luminal irregularities. Mid 25%. Small OM2 with ostial 95%. MOM with subtotal stenosis. There is filling of two moderate sized OMs with SVG. Both of these vessels are occluded proximally but demonstrated no high grade disease after the graft anastomosis.  Right coronary artery (RCA): Occluded proximally. PDA is moderate sized and fills via SVG. Two moderate sized PLs backfill via this graft and are free if disease. Grafts:  LIMA: Patent to the LAD. SVG to Diag: Patent with proximal circumferential 50% proximal stenosis and mild luminal irregularities. SVG to OM x 2: Patent with mild luminal irregularities. SVG to RCA: Mild luminal irregularities with mid 40% stenosis. Left ventriculography: Left ventricular systolic function is reduced, LVEF is estimated at 35%, there is no significant  mitral regurgitation.   Current Outpatient Prescriptions  Medication Sig Dispense Refill  . amiodarone (PACERONE) 200 MG tablet Take 1 tablet (200 mg total) by mouth 2 (two) times daily. 60 tablet 11  . amLODipine (NORVASC) 10 MG tablet Take 10 mg by mouth daily.    [name]. aspirin EC 81 MG tablet Take 1 tablet (81 mg total) by mouth daily.    . citalopram (CELEXA) 10 MG tablet Take 10 mg by mouth daily. Reported on 06/06/2016    . dabigatran (PRADAXA) 150 MG CAPS capsule Take 1 capsule (150 mg total) by mouth 2 (two) times daily. 180 capsule 3  . furosemide (LASIX) 20 MG tablet Take 3 tablets (60 mg total) by mouth daily. 90 tablet 11  . ibuprofen (ADVIL,MOTRIN) 200 MG tablet Take 400 mg by mouth every 6 (six) hours as needed (For pain.).    [name]. lisinopril (PRINIVIL,ZESTRIL) 10 MG tablet Take 1 tablet (10 mg total) by mouth daily. 90 tablet 3  . metoprolol succinate (TOPROL-XL) 50 MG 24 hr tablet Take 1 tablet (50 mg total) by mouth daily. Please schedule appointment for refills. 90 tablet 3  . Multiple Vitamin (MULITIVITAMIN WITH MINERALS) TABS Take 1 tablet by mouth daily.    . niacin (NIASPAN) 500 MG CR tablet Take 1,000 mg by mouth 2 (two) times daily.     . nitroGLYCERIN (NITROSTAT) 0.4 MG SL tablet Place 1 tablet (0.4 mg total) under the tongue every 5 (five) minutes as needed for chest pain. 100 tablet 3  . potassium chloride SA (K-DUR,KLOR-CON) 20 MEQ tablet Take 80 mEq by mouth  2 (two) times daily.     . pravastatin (PRAVACHOL) 80 MG tablet TAKE 1 TABLET EVERY DAY 90 tablet 0   No current facility-administered medications for this visit.      Past Medical History:  Diagnosis Date  . AICD (automatic cardioverter/defibrillator) present   . Anginal pain (HCC)   . Aortic dissection (HCC)    H/O focal aortic dissection  . CAD (coronary artery disease)    a. s/p CABG;  cath 10/07:  LM ok, LAD occluded, AV CFX 95%, pRCA occluded; L-LAD, S-OM2/OM3, S-PDA ok  . Cerebrovascular disease    a.  s/p prior Left CEA;  followed by Dr. [name]  . CHF (congestive heart failure) (HCC)   . CKD (chronic kidney disease)   . COPD (chronic obstructive pulmonary disease) (HCC)   . Depression   . DVT (deep venous thrombosis) (HCC)   . History of stomach ulcers   . Hyperlipidemia   . Hypertension   . Iliac artery aneurysm, left (HCC)    followed by Dr. [name]  . Ischemic cardiomyopathy    a. EF 25% by last myoview in 11/2015. s/p CRT-D  . LBBB (left bundle branch block)   . Myocardial infarction (HCC) 1997  . Nephrolithiasis   . PAF (paroxysmal atrial fibrillation) (HCC)    a. on amio and pradaxa.   . Stroke (HCC) 12/2015 "several mini"   a. on coumadin --> switched to pradaxa    Past Surgical History:  Procedure Laterality Date  . ABDOMINAL AORTIC ANEURYSM REPAIR N/A 01/03/2016   Procedure: ANEURYSM ABDOMINAL AORTA, BILATERAL COMMON ILIAC REPAIR;  Surgeon: [name], MD;  Location: [facility] OR;  Service: Vascular;  Laterality: N/A;  . APPENDECTOMY  09/11/11  . BACK SURGERY    . BI-VENTRICULAR IMPLANTABLE CARDIOVERTER DEFIBRILLATOR N/A 08/22/2012   Procedure: BI-VENTRICULAR IMPLANTABLE CARDIOVERTER DEFIBRILLATOR  (CRT-D);  Surgeon: [name], MD;  Location: [facility] CATH LAB;  Service: Cardiovascular;  Laterality: N/A;  . BI-VENTRICULAR IMPLANTABLE CARDIOVERTER DEFIBRILLATOR  (CRT-D)  08/22/2012   SJM Quadra Assura BiV ICD implanted by Dr [name]  . CARDIAC CATHETERIZATION  1998; 2013  . CARDIAC CATHETERIZATION  06/01/2016   atrial appendage occlusion  . CARDIOVERSION N/A 07/26/2016   Procedure: CARDIOVERSION;  Surgeon: Lars Masson, MD;  Location: [facility] ENDOSCOPY;  Service: Cardiovascular;  Laterality: N/A;  . CAROTID ENDARTERECTOMY Left July  2005  . CORONARY ARTERY BYPASS GRAFT  1998   CABG X5  . CYSTOSCOPY W/ STONE MANIPULATION  X 2  . ESOPHAGOGASTRODUODENOSCOPY N/A 02/28/2016   Procedure: ESOPHAGOGASTRODUODENOSCOPY (EGD);  Surgeon: [name], MD;  Location: [facility] ENDOSCOPY;  Service:  Endoscopy;  Laterality: N/A;  . LEFT ATRIAL APPENDAGE OCCLUSION N/A 06/01/2016   Procedure: LEFT ATRIAL APPENDAGE OCCLUSION;  Surgeon: [name], MD;  Location: [facility] INVASIVE CV LAB;  Service: Cardiovascular;  Laterality: N/A;  . POSTERIOR FUSION LUMBAR SPINE  Sep 27, 2014   L4-L5  . TEE WITHOUT CARDIOVERSION N/A 07/26/2016   Procedure: TRANSESOPHAGEAL ECHOCARDIOGRAM (TEE);  Surgeon: Lars Masson, MD;  Location: [facility] ENDOSCOPY;  Service: Cardiovascular;  Laterality: N/A;    Social History   Social History  . Marital status: Widowed    Spouse name: N/A  . Number of children: 3  . Years of education: 31 TH   Occupational History  . RETIRED---truck driver Retired   Social History Main Topics  . Smoking status: Current Every Day Smoker    Packs/day: 0.50    Years: 48.00    Types: Cigarettes  Last attempt to quit: 12/11/2004  . Smokeless tobacco: Never Used  . Alcohol use 0.0 oz/week     Comment: 06/01/2016 "haven't had a drink in years"  . Drug use: No  . Sexual activity: Yes   Other Topics Concern  . Not on file   Social History Narrative   Patient is married with 3 children.   Patient is right handed.   Patient has 10 th grade education.   Patient drinks tea occasionally.    Family History  Problem Relation Age of Onset  . Stroke Maternal Uncle   . Stroke Maternal Uncle   . Heart attack Neg Hx   . Hypertension Neg Hx     ROS: no fevers or chills, productive cough, hemoptysis, dysphasia, odynophagia, melena, hematochezia, dysuria, hematuria, rash, seizure activity, orthopnea, PND, pedal edema, claudication. Remaining systems are negative.  Physical Exam: Well-developed well-nourished in no acute distress.  Skin is warm and dry.  HEENT is normal.  Neck is supple.  Chest is clear to auscultation with normal expansion.  Cardiovascular exam is regular rate and rhythm.  Abdominal exam nontender or distended. No masses palpated. Extremities show no edema. neuro  grossly intact  ECG  A/P  1 Paroxysmal atrial fibrillation-patient remains in sinus rhythm on examination. Continue amiodarone. Resume Toprol. Continue pradaxa. Recent TEE showed one lobe of left atrial appendage not covered. May need to remain on anticoagulation long-term. Follow-up Dr. [name] for further discussion.  2 hyperlipidemia-continue statin.  3 ischemic cardiomyopathy-patient has somehow stopped taking his beta blocker and ACE inhibitor. Resume Toprol 25 mg daily and lisinopril 2.5 mg daily. Check potassium and renal function in 1 week. Advanced medications as tolerated.  4 coronary artery disease-continue statin. No aspirin given need for anticoagulation.  5 ICD-managed by electrophysiology.  6 acute on chronic systolic congestive heart failure-he is euvolemic today. Continue present dose of Lasix.  7 cerebrovascular disease/abdominal aortic aneurysm status post repair/iliac aneurysm-followed by vascular surgery.  Olga Millers, MD

## 2016-08-07 ENCOUNTER — Other Ambulatory Visit: Payer: Self-pay | Admitting: *Deleted

## 2016-08-07 ENCOUNTER — Encounter: Payer: Self-pay | Admitting: Cardiology

## 2016-08-07 ENCOUNTER — Ambulatory Visit (INDEPENDENT_AMBULATORY_CARE_PROVIDER_SITE_OTHER): Payer: Medicare Other | Admitting: Cardiology

## 2016-08-07 VITALS — BP 138/76 | HR 73 | Ht 73.0 in | Wt 198.0 lb

## 2016-08-07 DIAGNOSIS — I482 Chronic atrial fibrillation: Secondary | ICD-10-CM | POA: Diagnosis not present

## 2016-08-07 DIAGNOSIS — Z79899 Other long term (current) drug therapy: Secondary | ICD-10-CM | POA: Diagnosis not present

## 2016-08-07 DIAGNOSIS — I4821 Permanent atrial fibrillation: Secondary | ICD-10-CM

## 2016-08-07 DIAGNOSIS — I2510 Atherosclerotic heart disease of native coronary artery without angina pectoris: Secondary | ICD-10-CM | POA: Diagnosis not present

## 2016-08-07 DIAGNOSIS — I255 Ischemic cardiomyopathy: Secondary | ICD-10-CM | POA: Diagnosis not present

## 2016-08-07 MED ORDER — DABIGATRAN ETEXILATE MESYLATE 150 MG PO CAPS: 150.0000 mg | ORAL_CAPSULE | Freq: Two times a day (BID) | ORAL | 3 refills | Status: DC

## 2016-08-07 MED ORDER — METOPROLOL SUCCINATE ER 25 MG PO TB24: 25.0000 mg | ORAL_TABLET | Freq: Every day | ORAL | 3 refills | Status: DC

## 2016-08-07 MED ORDER — LISINOPRIL 2.5 MG PO TABS: 2.5000 mg | ORAL_TABLET | Freq: Every day | ORAL | 3 refills | Status: DC

## 2016-08-07 NOTE — Progress Notes (Signed)
Electrophysiology Office Note Date: 08/09/2016  ID:  [name] [name], DOB 1941-03-23, MRN [id number]  PCP: [name], MD Primary Cardiologist: [name] Electrophysiologist: [name]  CC: follow up [name] [name] is a 75 y.o. male seen today post Watchman implant.  Since last being seen in clinic, the patient reports doing reasonably well. Follow up TEE showed well seated device with no significant jet. He has not maintained SR since cardioversion at time of 6 week TEE.  He reports compliance with medications today. He has not had procedural related complications. He does have persistent shortness of breath with exertion that was improved while he was in SR post cardioversion for a short time.  He denies chest pain, palpitations, dyspnea, PND, orthopnea, nausea, vomiting, dizziness, syncope, edema, or early satiety.  He has not had ICD shocks.   Device History: STJ CRTD implanted 2013 for ICM, chronic systolic heart failure History of appropriate therapy: No History of AAD therapy: Yes - amiodarone for AF    Past Medical History:  Diagnosis Date  . AICD (automatic cardioverter/defibrillator) present   . Anginal pain (HCC)   . Aortic dissection (HCC)    H/O focal aortic dissection  . CAD (coronary artery disease)    a. s/p CABG;  cath 10/07:  LM ok, LAD occluded, AV CFX 95%, pRCA occluded; L-LAD, S-OM2/OM3, S-PDA ok  . Cerebrovascular disease    a. s/p prior Left CEA;  followed by Dr. [name]  . CHF (congestive heart failure) (HCC)   . CKD (chronic kidney disease)   . COPD (chronic obstructive pulmonary disease) (HCC)   . Depression   . DVT (deep venous thrombosis) (HCC)   . History of stomach ulcers   . Hyperlipidemia   . Hypertension   . Iliac artery aneurysm, left (HCC)    followed by Dr. [name]  . Ischemic cardiomyopathy    a. EF 25% by last myoview in 11/2015. s/p CRT-D  . LBBB (left bundle branch block)   . Myocardial infarction (HCC) 1997  .  Nephrolithiasis   . PAF (paroxysmal atrial fibrillation) (HCC)    a. on amio and pradaxa.   . Stroke (HCC) 12/2015 "several mini"   a. on coumadin --> switched to pradaxa   Past Surgical History:  Procedure Laterality Date  . ABDOMINAL AORTIC ANEURYSM REPAIR N/A 01/03/2016   Procedure: ANEURYSM ABDOMINAL AORTA, BILATERAL COMMON ILIAC REPAIR;  Surgeon: [name], MD;  Location: [facility] OR;  Service: Vascular;  Laterality: N/A;  . APPENDECTOMY  09/11/11  . BACK SURGERY    . BI-VENTRICULAR IMPLANTABLE CARDIOVERTER DEFIBRILLATOR N/A 08/22/2012   Procedure: BI-VENTRICULAR IMPLANTABLE CARDIOVERTER DEFIBRILLATOR  (CRT-D);  Surgeon: [name], MD;  Location[facility] CATH LAB;  Service: Cardiovascular;  Laterality: N/A;  . BI-VENTRICULAR IMPLANTABLE CARDIOVERTER DEFIBRILLATOR  (CRT-D)  08/22/2012   SJM Quadra Assura BiV ICD implanted by Dr [name]  . CARDIAC CATHETERIZATION  1998; 2013  . CARDIAC CATHETERIZATION  06/01/2016   atrial appendage occlusion  . CARDIOVERSION N/A 07/26/2016   Procedure: CARDIOVERSION;  Surgeon: Lars Masson, MD;  Location: [facility] ENDOSCOPY;  Service: Cardiovascular;  Laterality: N/A;  . CAROTID ENDARTERECTOMY Left July  2005  . CORONARY ARTERY BYPASS GRAFT  1998   CABG X5  . CYSTOSCOPY W/ STONE MANIPULATION  X 2  . ESOPHAGOGASTRODUODENOSCOPY N/A 02/28/2016   Procedure: ESOPHAGOGASTRODUODENOSCOPY (EGD);  Surgeon: [name], MD;  Location: [facility] ENDOSCOPY;  Service: Endoscopy;  Laterality: N/A;  . LEFT ATRIAL APPENDAGE OCCLUSION N/A 06/01/2016  Procedure: LEFT ATRIAL APPENDAGE OCCLUSION;  Surgeon: [name], MD;  Location: [facility] INVASIVE CV LAB;  Service: Cardiovascular;  Laterality: N/A;  . POSTERIOR FUSION LUMBAR SPINE  Sep 27, 2014   L4-L5  . TEE WITHOUT CARDIOVERSION N/A 07/26/2016   Procedure: TRANSESOPHAGEAL ECHOCARDIOGRAM (TEE);  Surgeon: Lars Masson, MD;  Location: [facility] ENDOSCOPY;  Service: Cardiovascular;  Laterality: N/A;    Current Outpatient Prescriptions    Medication Sig Dispense Refill  . amiodarone (PACERONE) 200 MG tablet Take 1 tablet (200 mg total) by mouth 2 (two) times daily. 60 tablet 11  . amLODipine (NORVASC) 10 MG tablet Take 10 mg by mouth daily.    [name] aspirin EC 325 MG EC tablet Take 1 tablet (325 mg total) by mouth daily.    . citalopram (CELEXA) 10 MG tablet Take 10 mg by mouth daily. Reported on 06/06/2016    . furosemide (LASIX) 20 MG tablet Take 3 tablets (60 mg total) by mouth daily. 90 tablet 11  . ibuprofen (ADVIL,MOTRIN) 200 MG tablet Take 400 mg by mouth every 6 (six) hours as needed (For pain.).    [name] lisinopril (PRINIVIL,ZESTRIL) 10 MG tablet Take 10 mg by mouth daily.     . metoprolol succinate (TOPROL XL) 25 MG 24 hr tablet Take 1 tablet (25 mg total) by mouth daily. 90 tablet 3  . Multiple Vitamin (MULITIVITAMIN WITH MINERALS) TABS Take 1 tablet by mouth daily.    . niacin (NIASPAN) 500 MG CR tablet Take 1,000 mg by mouth 2 (two) times daily.     . nitroGLYCERIN (NITROSTAT) 0.4 MG SL tablet Place 1 tablet (0.4 mg total) under the tongue every 5 (five) minutes as needed for chest pain. 100 tablet 3  . potassium chloride SA (K-DUR,KLOR-CON) 20 MEQ tablet Take 80 mEq by mouth 2 (two) times daily.     . pravastatin (PRAVACHOL) 80 MG tablet TAKE 1 TABLET EVERY DAY 90 tablet 0  . clopidogrel (PLAVIX) 75 MG tablet Take 1 tablet (75 mg total) by mouth daily. 30 tablet 0  . clopidogrel (PLAVIX) 75 MG tablet Take 1 tablet (75 mg total) by mouth daily. 90 tablet 3   No current facility-administered medications for this visit.     Allergies:   Buprenorphine hcl and Morphine and related   Social History: Social History   Social History  . Marital status: Widowed    Spouse name: N/A  . Number of children: 3  . Years of education: 104 TH   Occupational History  . RETIRED---truck driver Retired   Social History Main Topics  . Smoking status: Current Every Day Smoker    Packs/day: 0.50    Years: 48.00    Types: Cigarettes     Last attempt to quit: 12/11/2004  . Smokeless tobacco: Never Used  . Alcohol use 0.0 oz/week     Comment: 06/01/2016 "haven't had a drink in years"  . Drug use: No  . Sexual activity: Yes   Other Topics Concern  . Not on file   Social History Narrative   Patient is married with 3 children.   Patient is right handed.   Patient has 10 th grade education.   Patient drinks tea occasionally.    Family History: Family History  Problem Relation Age of Onset  . Stroke Maternal Uncle   . Stroke Maternal Uncle   . Heart attack Neg Hx   . Hypertension Neg Hx     Review of Systems: All other systems reviewed and are  otherwise negative except as noted above.   Physical Exam: VS:  BP 120/70   Pulse 72   Ht 6\' 1"  (1.854 m)   Wt 191 lb 9.6 oz (86.9 kg)   BMI 25.28 kg/m  , BMI Body mass index is 25.28 kg/m.  GEN- The patient is elderly appearing, alert and oriented x 3 today.   HEENT: normocephalic, atraumatic; sclera clear, conjunctiva pink; hearing intact; oropharynx clear; neck supple, +JVP Lungs- Clear to ausculation bilaterally, normal work of breathing.  No wheezes, rales, rhonchi Heart- Irregular rate and rhythm, no murmurs, rubs or gallops  GI- soft, non-tender, non-distended, bowel sounds present  Extremities- no clubbing, cyanosis, or edema; DP/PT/radial pulses 2+ bilaterally MS- no significant deformity or atrophy Skin- warm and dry, no rash or lesion; ICD pocket well healed Psych- euthymic mood, full affect Neuro- strength and sensation are intact  ICD interrogation- reviewed in detail today,  See PACEART report  EKG:  EKG is not ordered today.  Recent Labs: 03/15/2016: Magnesium 1.7 03/28/2016: Brain Natriuretic Peptide 1,794.7 05/10/2016: ALT 9; TSH 0.84 07/10/2016: BUN 33; Creat 1.72; Platelets 278 07/26/2016: Hemoglobin 13.3; Potassium 5.0; Sodium 144   Wt Readings from Last 3 Encounters:  08/09/16 191 lb 9.6 oz (86.9 kg)  08/07/16 198 lb (89.8 kg)  07/26/16  182 lb (82.6 kg)     Other studies Reviewed: Additional studies/ records that were reviewed today include: Dr Jenel LucksAllred's office notes, hospital records  Assessment and Plan:  1.  Persistent atrial fibrillation Doing well s/p Watchman  6 week TEE with well seated device and no significant jet Back in AF post DCCV despite amio re-loading. Will stop Pradaxa at this time. Start Plavix 75mg  daily, increase ASA to 325mg  daily  He has failed rhythm control with amiodarone and repeat cardioversion.  His AF is limiting his CRT pacing and causing worsening HF.  Treatment options discussed with patient today including AVN ablation. Risks, benefits reviewed with patient, daughter, son-in-law who wish to proceed.  LFT's, TSH stable 04/2016. Annual eye exams recommended.  2. Chronic systolic dysfunction Slightly volume overloaded on exam  Normal ICD function - CRT pacing 41% with AF See Pace Art report No changes today  3.  HTN Stable No change required today  Current medicines are reviewed at length with the patient today.   The patient does not have concerns regarding his medicines.  The following changes were made today:  Stop Pradaxa, start Plavix 75mg  daily, increase ASA to 325mg  daily   Labs/ tests ordered today include:  Orders Placed This Encounter  Procedures  . Basic metabolic panel  . CBC with Differential/Platelet  . Protime-INR    Disposition:   Follow up with Dr Johney FrameAllred or me post AVN ablation   Signed, Gypsy BalsamAmber Devone Bonilla, NP  08/09/2016 11:24 AM  [facility] [address] [place] [address] [phone] (office) [phone] (fax)

## 2016-08-07 NOTE — Patient Instructions (Signed)
Medication Instructions:  Your physician has recommended you make the following change in your medication:  1- CHANGE Lisinopril to 2.5 mg  (1 tablet) by mouth daily. 2- CHANGE Toprol to 25mg   (1 tablet) by mouth daily.   Labwork: Your physician recommends that you return for lab work in: ONE WEEK.   Follow-Up: Your physician recommends that you schedule a follow-up appointment in: 3 MONTHS WITH DR CRENSHAW.   If you need a refill on your cardiac medications before your next appointment, please call your pharmacy.

## 2016-08-09 ENCOUNTER — Encounter (INDEPENDENT_AMBULATORY_CARE_PROVIDER_SITE_OTHER): Payer: Self-pay

## 2016-08-09 ENCOUNTER — Encounter: Payer: Self-pay | Admitting: Nurse Practitioner

## 2016-08-09 ENCOUNTER — Ambulatory Visit (INDEPENDENT_AMBULATORY_CARE_PROVIDER_SITE_OTHER): Payer: Medicare Other | Admitting: Nurse Practitioner

## 2016-08-09 ENCOUNTER — Telehealth: Payer: Self-pay | Admitting: Internal Medicine

## 2016-08-09 ENCOUNTER — Encounter: Payer: Self-pay | Admitting: Internal Medicine

## 2016-08-09 VITALS — BP 120/70 | HR 72 | Ht 73.0 in | Wt 191.6 lb

## 2016-08-09 DIAGNOSIS — I481 Persistent atrial fibrillation: Secondary | ICD-10-CM | POA: Diagnosis not present

## 2016-08-09 DIAGNOSIS — Z01812 Encounter for preprocedural laboratory examination: Secondary | ICD-10-CM

## 2016-08-09 DIAGNOSIS — Z79899 Other long term (current) drug therapy: Secondary | ICD-10-CM

## 2016-08-09 DIAGNOSIS — I5022 Chronic systolic (congestive) heart failure: Secondary | ICD-10-CM | POA: Diagnosis not present

## 2016-08-09 DIAGNOSIS — I4819 Other persistent atrial fibrillation: Secondary | ICD-10-CM

## 2016-08-09 DIAGNOSIS — I5023 Acute on chronic systolic (congestive) heart failure: Secondary | ICD-10-CM

## 2016-08-09 DIAGNOSIS — I255 Ischemic cardiomyopathy: Secondary | ICD-10-CM

## 2016-08-09 LAB — CBC WITH DIFFERENTIAL/PLATELET
BASOS ABS: 52 {cells}/uL (ref 0–200)
Basophils Relative: 1 %
EOS ABS: 156 {cells}/uL (ref 15–500)
EOS PCT: 3 %
HCT: 40.7 % (ref 38.5–50.0)
HEMOGLOBIN: 12.6 g/dL — AB (ref 13.2–17.1)
LYMPHS ABS: 1144 {cells}/uL (ref 850–3900)
Lymphocytes Relative: 22 %
MCH: 26.7 pg — AB (ref 27.0–33.0)
MCHC: 31 g/dL — AB (ref 32.0–36.0)
MCV: 86.2 fL (ref 80.0–100.0)
MONOS PCT: 9 %
MPV: 10.4 fL (ref 7.5–12.5)
Monocytes Absolute: 468 cells/uL (ref 200–950)
NEUTROS ABS: 3380 {cells}/uL (ref 1500–7800)
NEUTROS PCT: 65 %
Platelets: 180 10*3/uL (ref 140–400)
RBC: 4.72 MIL/uL (ref 4.20–5.80)
RDW: 16.9 % — ABNORMAL HIGH (ref 11.0–15.0)
WBC: 5.2 10*3/uL (ref 3.8–10.8)

## 2016-08-09 LAB — PROTIME-INR
INR: 1.5 — ABNORMAL HIGH
Prothrombin Time: 15.9 s — ABNORMAL HIGH (ref 9.0–11.5)

## 2016-08-09 LAB — BASIC METABOLIC PANEL
BUN: 21 mg/dL (ref 7–25)
CALCIUM: 7.9 mg/dL — AB (ref 8.6–10.3)
CHLORIDE: 98 mmol/L (ref 98–110)
CO2: 32 mmol/L — AB (ref 20–31)
CREATININE: 1.36 mg/dL — AB (ref 0.70–1.18)
GLUCOSE: 97 mg/dL (ref 65–99)
Potassium: 2.7 mmol/L — CL (ref 3.5–5.3)
Sodium: 143 mmol/L (ref 135–146)

## 2016-08-09 MED ORDER — ASPIRIN 325 MG PO TBEC: 325.0000 mg | DELAYED_RELEASE_TABLET | Freq: Every day | ORAL | Status: AC

## 2016-08-09 MED ORDER — CLOPIDOGREL BISULFATE 75 MG PO TABS: 75.0000 mg | ORAL_TABLET | Freq: Every day | ORAL | 0 refills | Status: DC

## 2016-08-09 MED ORDER — CLOPIDOGREL BISULFATE 75 MG PO TABS: 75.0000 mg | ORAL_TABLET | Freq: Every day | ORAL | 3 refills | Status: DC

## 2016-08-09 NOTE — Patient Instructions (Addendum)
Medication Instructions:  Your physician has recommended you make the following change in your medication:  1- STOP Pradaxa  2- INCREASE Aspirin to 325 mg by mouth daily 3- START Plavix 75 mg by mouth daily  Labwork: Your physician recommends that you have lab work today BMET, CBC, and PT/INR  Testing/Procedures: Your physician has recommended that you have an ablation on Thursday August 17, 2016. Catheter ablation is a medical procedure used to treat some cardiac arrhythmias (irregular heartbeats). During catheter ablation, a long, thin, flexible tube is put into a blood vessel in your groin (upper thigh), or neck. This tube is called an ablation catheter. It is then guided to your heart through the blood vessel. Radio frequency waves destroy small areas of heart tissue where abnormal heartbeats may cause an arrhythmia to start. Please see the instruction sheet given to you today.  Follow-Up: Your physician recommends that you schedule a follow-up appointment with Dr. [name] after your ablation.   Any Other Special Instructions Will Be Listed Below (If Applicable).     If you need a refill on your cardiac medications before your next appointment, please call your pharmacy.

## 2016-08-09 NOTE — Telephone Encounter (Signed)
I was contacted by [facility] regarding critical laboratory values on [name].  His potassium that was collected today was noted to be 2.7 mmol/L.  I was able to reach Mr. Prawl by telephone and informed him to take two extra tablets of his home potassium of 20 mEq today for hypokalemia.  I also informed patient to contact Dr. Ludwig Clarks office for additional instructions.  Patient was doing fine and was without any symptoms when I reached him today.  Burton Apley, MD

## 2016-08-10 NOTE — Telephone Encounter (Signed)
LM for patient that he will need a repeat BMET today. Informed him via VM that this can be done in Dr. Ludwig Clarks building on the 1sst floor in [address] and no appt is needed. Asked for return call to verify receipt of information

## 2016-08-10 NOTE — Telephone Encounter (Signed)
Please make sure pt is taking his kdur, repeat bmet today Olga Millers

## 2016-08-11 ENCOUNTER — Telehealth: Payer: Self-pay | Admitting: Internal Medicine

## 2016-08-11 LAB — CUP PACEART INCLINIC DEVICE CHECK
Implantable Lead Implant Date: 20130912
Implantable Lead Location: 753859
MDC IDC LEAD IMPLANT DT: 20130912
MDC IDC LEAD IMPLANT DT: 20130912
MDC IDC LEAD LOCATION: 753858
MDC IDC LEAD LOCATION: 753860
MDC IDC SESS DTM: 20170901082452
Pulse Gen Serial Number: 1075181

## 2016-08-11 NOTE — Telephone Encounter (Signed)
LM for patient with info denoted below.

## 2016-08-11 NOTE — Telephone Encounter (Signed)
New Message  Pt voiced he has a question about the upcoming procedure and would like for the nurse to contact him.  Please follow up with pt. Thanks!

## 2016-08-11 NOTE — Telephone Encounter (Addendum)
Verified with DOD MD Kelly-pt is to take additional to daily dose of (per pt report).   Pt will take (2 tablets) TID until blood work is completed on Tuesday.  Pt verbalized understanding.  Med list updated.

## 2016-08-11 NOTE — Telephone Encounter (Signed)
Patient should take an additional 40 meq KDur daily in addition to his normal dose; bmet Tuesday 08/15/16 Olga Millers

## 2016-08-11 NOTE — Telephone Encounter (Signed)
Returned call to patient-  Pt reports he is taking 2 tablets ( ) Kdur in the AM and 2 tablets ( ) Kdur in the PM, totaling daily.   (Rx is 2x day).    Will route to MD to verify increase in dose.  Pt aware.

## 2016-08-11 NOTE — Telephone Encounter (Signed)
Left message for patient that I am leaving for the day and to call me back Tues and hopefully will be able to answer his questions

## 2016-08-11 NOTE — Telephone Encounter (Signed)
Called patient-pt states he is unable to get blood work done today but can get it done first thing Tuesday morning (d/t Monday being holiday).   Reports he is taking his kdur as prescribed.    Offered to order labs for him to have drawn at [facility] in [place] (closer to patient) but states he cannot get there before they close.   Will route to MD Crenshaw to make aware.

## 2016-08-15 ENCOUNTER — Other Ambulatory Visit: Payer: Medicare Other | Admitting: *Deleted

## 2016-08-15 ENCOUNTER — Other Ambulatory Visit: Payer: Self-pay | Admitting: *Deleted

## 2016-08-15 DIAGNOSIS — I4821 Permanent atrial fibrillation: Secondary | ICD-10-CM

## 2016-08-15 LAB — CBC WITH DIFFERENTIAL/PLATELET
BASOS ABS: 56 {cells}/uL (ref 0–200)
Basophils Relative: 1 %
EOS ABS: 224 {cells}/uL (ref 15–500)
Eosinophils Relative: 4 %
HEMATOCRIT: 38.5 % (ref 38.5–50.0)
HEMOGLOBIN: 11.9 g/dL — AB (ref 13.2–17.1)
LYMPHS ABS: 1008 {cells}/uL (ref 850–3900)
LYMPHS PCT: 18 %
MCH: 26.5 pg — AB (ref 27.0–33.0)
MCHC: 30.9 g/dL — AB (ref 32.0–36.0)
MCV: 85.7 fL (ref 80.0–100.0)
MONO ABS: 560 {cells}/uL (ref 200–950)
MPV: 9.9 fL (ref 7.5–12.5)
Monocytes Relative: 10 %
NEUTROS PCT: 67 %
Neutro Abs: 3752 cells/uL (ref 1500–7800)
Platelets: 137 10*3/uL — ABNORMAL LOW (ref 140–400)
RBC: 4.49 MIL/uL (ref 4.20–5.80)
RDW: 16.9 % — AB (ref 11.0–15.0)
WBC: 5.6 10*3/uL (ref 3.8–10.8)

## 2016-08-15 LAB — BASIC METABOLIC PANEL
BUN: 21 mg/dL (ref 7–25)
CO2: 26 mmol/L (ref 20–31)
Calcium: 8.8 mg/dL (ref 8.6–10.3)
Chloride: 109 mmol/L (ref 98–110)
Creat: 1.23 mg/dL — ABNORMAL HIGH (ref 0.70–1.18)
GLUCOSE: 90 mg/dL (ref 65–99)
POTASSIUM: 3.6 mmol/L (ref 3.5–5.3)
SODIUM: 146 mmol/L (ref 135–146)

## 2016-08-15 NOTE — Addendum Note (Signed)
Addended by: Sydnee Cabal R on: 08/15/2016 09:05 AM   Modules accepted: Orders

## 2016-08-15 NOTE — Addendum Note (Signed)
Addended by: Sydnee Cabal R on: 08/15/2016 09:02 AM   Modules accepted: Orders

## 2016-08-15 NOTE — Progress Notes (Signed)
BMET 

## 2016-08-16 NOTE — Telephone Encounter (Signed)
Patient came in for pre-op labs 08/15/16

## 2016-08-17 ENCOUNTER — Ambulatory Visit (INDEPENDENT_AMBULATORY_CARE_PROVIDER_SITE_OTHER): Payer: Medicare Other | Admitting: *Deleted

## 2016-08-17 ENCOUNTER — Ambulatory Visit (HOSPITAL_COMMUNITY)
Admission: RE | Admit: 2016-08-17 | Discharge: 2016-08-18 | Disposition: A | Discharge status: Home / Self Care | Payer: Medicare Other | Source: Ambulatory Visit | Attending: Internal Medicine | Admitting: Internal Medicine

## 2016-08-17 ENCOUNTER — Encounter (HOSPITAL_COMMUNITY): Payer: Self-pay | Admitting: *Deleted

## 2016-08-17 ENCOUNTER — Encounter (HOSPITAL_COMMUNITY)
Admission: RE | Disposition: A | Discharge status: Home / Self Care | Payer: Self-pay | Source: Ambulatory Visit | Attending: Internal Medicine

## 2016-08-17 DIAGNOSIS — I2510 Atherosclerotic heart disease of native coronary artery without angina pectoris: Secondary | ICD-10-CM | POA: Diagnosis not present

## 2016-08-17 DIAGNOSIS — Z8673 Personal history of transient ischemic attack (TIA), and cerebral infarction without residual deficits: Secondary | ICD-10-CM | POA: Insufficient documentation

## 2016-08-17 DIAGNOSIS — I480 Paroxysmal atrial fibrillation: Secondary | ICD-10-CM | POA: Diagnosis not present

## 2016-08-17 DIAGNOSIS — I482 Chronic atrial fibrillation: Secondary | ICD-10-CM | POA: Insufficient documentation

## 2016-08-17 DIAGNOSIS — N189 Chronic kidney disease, unspecified: Secondary | ICD-10-CM | POA: Insufficient documentation

## 2016-08-17 DIAGNOSIS — Z79899 Other long term (current) drug therapy: Secondary | ICD-10-CM | POA: Insufficient documentation

## 2016-08-17 DIAGNOSIS — F17210 Nicotine dependence, cigarettes, uncomplicated: Secondary | ICD-10-CM | POA: Diagnosis not present

## 2016-08-17 DIAGNOSIS — I4891 Unspecified atrial fibrillation: Secondary | ICD-10-CM | POA: Diagnosis not present

## 2016-08-17 DIAGNOSIS — E785 Hyperlipidemia, unspecified: Secondary | ICD-10-CM | POA: Diagnosis not present

## 2016-08-17 DIAGNOSIS — Z7902 Long term (current) use of antithrombotics/antiplatelets: Secondary | ICD-10-CM | POA: Insufficient documentation

## 2016-08-17 DIAGNOSIS — Z86718 Personal history of other venous thrombosis and embolism: Secondary | ICD-10-CM | POA: Diagnosis not present

## 2016-08-17 DIAGNOSIS — J449 Chronic obstructive pulmonary disease, unspecified: Secondary | ICD-10-CM | POA: Diagnosis not present

## 2016-08-17 DIAGNOSIS — Z95818 Presence of other cardiac implants and grafts: Secondary | ICD-10-CM | POA: Insufficient documentation

## 2016-08-17 DIAGNOSIS — I255 Ischemic cardiomyopathy: Secondary | ICD-10-CM | POA: Insufficient documentation

## 2016-08-17 DIAGNOSIS — F329 Major depressive disorder, single episode, unspecified: Secondary | ICD-10-CM | POA: Insufficient documentation

## 2016-08-17 DIAGNOSIS — Z951 Presence of aortocoronary bypass graft: Secondary | ICD-10-CM | POA: Diagnosis not present

## 2016-08-17 DIAGNOSIS — I495 Sick sinus syndrome: Secondary | ICD-10-CM

## 2016-08-17 DIAGNOSIS — I130 Hypertensive heart and chronic kidney disease with heart failure and stage 1 through stage 4 chronic kidney disease, or unspecified chronic kidney disease: Secondary | ICD-10-CM | POA: Insufficient documentation

## 2016-08-17 DIAGNOSIS — Z95810 Presence of automatic (implantable) cardiac defibrillator: Secondary | ICD-10-CM | POA: Insufficient documentation

## 2016-08-17 DIAGNOSIS — I447 Left bundle-branch block, unspecified: Secondary | ICD-10-CM | POA: Insufficient documentation

## 2016-08-17 DIAGNOSIS — I5022 Chronic systolic (congestive) heart failure: Secondary | ICD-10-CM | POA: Diagnosis not present

## 2016-08-17 DIAGNOSIS — Z7982 Long term (current) use of aspirin: Secondary | ICD-10-CM | POA: Insufficient documentation

## 2016-08-17 DIAGNOSIS — I252 Old myocardial infarction: Secondary | ICD-10-CM | POA: Insufficient documentation

## 2016-08-17 HISTORY — PX: ELECTROPHYSIOLOGIC STUDY: SHX172A

## 2016-08-17 LAB — MRSA PCR SCREENING: MRSA BY PCR: POSITIVE — AB

## 2016-08-17 SURGERY — AV NODE ABLATION
Anesthesia: LOCAL

## 2016-08-17 MED ORDER — ALBUTEROL SULFATE (2.5 MG/3ML) 0.083% IN NEBU
INHALATION_SOLUTION | RESPIRATORY_TRACT | Status: AC
  Administered 2016-08-17: 2.5 mg via RESPIRATORY_TRACT
  Filled 2016-08-17: qty 3

## 2016-08-17 MED ORDER — MUPIROCIN 2 % EX OINT
1.0000 "application " | TOPICAL_OINTMENT | Freq: Two times a day (BID) | CUTANEOUS | Status: DC
  Administered 2016-08-18 (×2): 1 via NASAL
  Filled 2016-08-17: qty 22

## 2016-08-17 MED ORDER — CHLORHEXIDINE GLUCONATE CLOTH 2 % EX PADS
6.0000 | MEDICATED_PAD | Freq: Every day | CUTANEOUS | Status: DC
  Administered 2016-08-18: 6 via TOPICAL

## 2016-08-17 MED ORDER — POTASSIUM CHLORIDE CRYS ER 20 MEQ PO TBCR
80.0000 meq | EXTENDED_RELEASE_TABLET | Freq: Two times a day (BID) | ORAL | Status: DC
  Administered 2016-08-17 – 2016-08-18 (×2): 80 meq via ORAL
  Filled 2016-08-17 (×2): qty 4

## 2016-08-17 MED ORDER — METOPROLOL SUCCINATE ER 25 MG PO TB24
25.0000 mg | ORAL_TABLET | Freq: Every day | ORAL | Status: DC
  Administered 2016-08-18: 25 mg via ORAL
  Filled 2016-08-17: qty 1

## 2016-08-17 MED ORDER — LIDOCAINE HCL (PF) 1 % IJ SOLN
INTRAMUSCULAR | Status: DC | PRN
  Administered 2016-08-17: 13 mL

## 2016-08-17 MED ORDER — CITALOPRAM HYDROBROMIDE 10 MG PO TABS
10.0000 mg | ORAL_TABLET | Freq: Every day | ORAL | Status: DC
  Administered 2016-08-18: 10 mg via ORAL
  Filled 2016-08-17: qty 1

## 2016-08-17 MED ORDER — IPRATROPIUM-ALBUTEROL 0.5-2.5 (3) MG/3ML IN SOLN
RESPIRATORY_TRACT | Status: AC
  Filled 2016-08-17: qty 3

## 2016-08-17 MED ORDER — ACETAMINOPHEN 325 MG PO TABS: 650.0000 mg | ORAL_TABLET | ORAL | Status: DC | PRN

## 2016-08-17 MED ORDER — AMLODIPINE BESYLATE 10 MG PO TABS
10.0000 mg | ORAL_TABLET | Freq: Every day | ORAL | Status: DC
  Administered 2016-08-18: 10 mg via ORAL
  Filled 2016-08-17: qty 1

## 2016-08-17 MED ORDER — SODIUM CHLORIDE 0.9 % IV SOLN: 250.0000 mL | INTRAVENOUS | Status: DC | PRN

## 2016-08-17 MED ORDER — LISINOPRIL 10 MG PO TABS
10.0000 mg | ORAL_TABLET | Freq: Every day | ORAL | Status: DC
  Administered 2016-08-18: 10 mg via ORAL
  Filled 2016-08-17: qty 1

## 2016-08-17 MED ORDER — FUROSEMIDE 40 MG PO TABS: 60.0000 mg | ORAL_TABLET | Freq: Every day | ORAL | Status: DC

## 2016-08-17 MED ORDER — ONDANSETRON HCL 4 MG/2ML IJ SOLN: 4.0000 mg | Freq: Four times a day (QID) | INTRAMUSCULAR | Status: DC | PRN

## 2016-08-17 MED ORDER — SODIUM CHLORIDE 0.9% FLUSH
3.0000 mL | Freq: Two times a day (BID) | INTRAVENOUS | Status: DC
  Administered 2016-08-17: 3 mL via INTRAVENOUS

## 2016-08-17 MED ORDER — IPRATROPIUM-ALBUTEROL 0.5-2.5 (3) MG/3ML IN SOLN
3.0000 mL | RESPIRATORY_TRACT | Status: DC | PRN
  Administered 2016-08-17 – 2016-08-18 (×2): 3 mL via RESPIRATORY_TRACT
  Filled 2016-08-17: qty 3

## 2016-08-17 MED ORDER — CLOPIDOGREL BISULFATE 75 MG PO TABS
75.0000 mg | ORAL_TABLET | Freq: Every day | ORAL | Status: DC
Start: 2016-08-18 — End: 2016-08-18
  Administered 2016-08-18: 75 mg via ORAL
  Filled 2016-08-17: qty 1

## 2016-08-17 MED ORDER — LIDOCAINE HCL (PF) 1 % IJ SOLN
INTRAMUSCULAR | Status: AC
  Filled 2016-08-17: qty 60

## 2016-08-17 MED ORDER — SODIUM CHLORIDE 0.9% FLUSH: 3.0000 mL | INTRAVENOUS | Status: DC | PRN

## 2016-08-17 MED ORDER — FUROSEMIDE 40 MG PO TABS
60.0000 mg | ORAL_TABLET | Freq: Every day | ORAL | Status: DC
  Administered 2016-08-18: 60 mg via ORAL
  Filled 2016-08-17: qty 1

## 2016-08-17 MED ORDER — ASPIRIN EC 325 MG PO TBEC
325.0000 mg | DELAYED_RELEASE_TABLET | Freq: Every day | ORAL | Status: DC
  Administered 2016-08-18: 325 mg via ORAL
  Filled 2016-08-17: qty 1

## 2016-08-17 MED ORDER — ALBUTEROL SULFATE (2.5 MG/3ML) 0.083% IN NEBU
2.5000 mg | INHALATION_SOLUTION | Freq: Once | RESPIRATORY_TRACT | Status: AC
  Administered 2016-08-17: 2.5 mg via RESPIRATORY_TRACT

## 2016-08-17 MED ORDER — FUROSEMIDE 10 MG/ML IJ SOLN
60.0000 mg | Freq: Once | INTRAMUSCULAR | Status: AC
  Administered 2016-08-17: 60 mg via INTRAVENOUS
  Filled 2016-08-17: qty 6

## 2016-08-17 MED ORDER — SODIUM CHLORIDE 0.9 % IV SOLN
INTRAVENOUS | Status: DC
  Administered 2016-08-17: 13:00:00 via INTRAVENOUS

## 2016-08-17 SURGICAL SUPPLY — 7 items
BAG SNAP BAND KOVER 36X36 (MISCELLANEOUS) ×2 IMPLANT
CATH THERM 7FR 4MM 5031TK1 (ABLATOR) ×1 IMPLANT
PACK EP LATEX FREE (CUSTOM PROCEDURE TRAY) ×2
PACK EP LF (CUSTOM PROCEDURE TRAY) ×1 IMPLANT
PAD DEFIB LIFELINK (PAD) ×2 IMPLANT
SHEATH PINNACLE 8F 10CM (SHEATH) ×1 IMPLANT
SHIELD RADPAD SCOOP 12X17 (MISCELLANEOUS) ×2 IMPLANT

## 2016-08-17 NOTE — Progress Notes (Signed)
Remote ICD transmission.   

## 2016-08-17 NOTE — H&P (View-Only) (Signed)
Electrophysiology Office Note Date: 08/09/2016  ID:  [name] [name], DOB 1941-03-23, MRN [id number]  PCP: [name], MD Primary Cardiologist: [name] Electrophysiologist: [name]  CC: follow up [name] [name] is a 75 y.o. male seen today post Watchman implant.  Since last being seen in clinic, the patient reports doing reasonably well. Follow up TEE showed well seated device with no significant jet. He has not maintained SR since cardioversion at time of 6 week TEE.  He reports compliance with medications today. He has not had procedural related complications. He does have persistent shortness of breath with exertion that was improved while he was in SR post cardioversion for a short time.  He denies chest pain, palpitations, dyspnea, PND, orthopnea, nausea, vomiting, dizziness, syncope, edema, or early satiety.  He has not had ICD shocks.   Device History: STJ CRTD implanted 2013 for ICM, chronic systolic heart failure History of appropriate therapy: No History of AAD therapy: Yes - amiodarone for AF    Past Medical History:  Diagnosis Date  . AICD (automatic cardioverter/defibrillator) present   . Anginal pain (HCC)   . Aortic dissection (HCC)    H/O focal aortic dissection  . CAD (coronary artery disease)    a. s/p CABG;  cath 10/07:  LM ok, LAD occluded, AV CFX 95%, pRCA occluded; L-LAD, S-OM2/OM3, S-PDA ok  . Cerebrovascular disease    a. s/p prior Left CEA;  followed by Dr. [name]  . CHF (congestive heart failure) (HCC)   . CKD (chronic kidney disease)   . COPD (chronic obstructive pulmonary disease) (HCC)   . Depression   . DVT (deep venous thrombosis) (HCC)   . History of stomach ulcers   . Hyperlipidemia   . Hypertension   . Iliac artery aneurysm, left (HCC)    followed by Dr. [name]  . Ischemic cardiomyopathy    a. EF 25% by last myoview in 11/2015. s/p CRT-D  . LBBB (left bundle branch block)   . Myocardial infarction (HCC) 1997  .  Nephrolithiasis   . PAF (paroxysmal atrial fibrillation) (HCC)    a. on amio and pradaxa.   . Stroke (HCC) 12/2015 "several mini"   a. on coumadin --> switched to pradaxa   Past Surgical History:  Procedure Laterality Date  . ABDOMINAL AORTIC ANEURYSM REPAIR N/A 01/03/2016   Procedure: ANEURYSM ABDOMINAL AORTA, BILATERAL COMMON ILIAC REPAIR;  Surgeon: [name], MD;  Location: [facility] OR;  Service: Vascular;  Laterality: N/A;  . APPENDECTOMY  09/11/11  . BACK SURGERY    . BI-VENTRICULAR IMPLANTABLE CARDIOVERTER DEFIBRILLATOR N/A 08/22/2012   Procedure: BI-VENTRICULAR IMPLANTABLE CARDIOVERTER DEFIBRILLATOR  (CRT-D);  Surgeon: [name], MD;  Location[facility] CATH LAB;  Service: Cardiovascular;  Laterality: N/A;  . BI-VENTRICULAR IMPLANTABLE CARDIOVERTER DEFIBRILLATOR  (CRT-D)  08/22/2012   SJM Quadra Assura BiV ICD implanted by Dr [name]  . CARDIAC CATHETERIZATION  1998; 2013  . CARDIAC CATHETERIZATION  06/01/2016   atrial appendage occlusion  . CARDIOVERSION N/A 07/26/2016   Procedure: CARDIOVERSION;  Surgeon: Lars Masson, MD;  Location: [facility] ENDOSCOPY;  Service: Cardiovascular;  Laterality: N/A;  . CAROTID ENDARTERECTOMY Left July  2005  . CORONARY ARTERY BYPASS GRAFT  1998   CABG X5  . CYSTOSCOPY W/ STONE MANIPULATION  X 2  . ESOPHAGOGASTRODUODENOSCOPY N/A 02/28/2016   Procedure: ESOPHAGOGASTRODUODENOSCOPY (EGD);  Surgeon: [name], MD;  Location: [facility] ENDOSCOPY;  Service: Endoscopy;  Laterality: N/A;  . LEFT ATRIAL APPENDAGE OCCLUSION N/A 06/01/2016  Procedure: LEFT ATRIAL APPENDAGE OCCLUSION;  Surgeon: [name], MD;  Location: [facility] INVASIVE CV LAB;  Service: Cardiovascular;  Laterality: N/A;  . POSTERIOR FUSION LUMBAR SPINE  Sep 27, 2014   L4-L5  . TEE WITHOUT CARDIOVERSION N/A 07/26/2016   Procedure: TRANSESOPHAGEAL ECHOCARDIOGRAM (TEE);  Surgeon: Lars Masson, MD;  Location: [facility] ENDOSCOPY;  Service: Cardiovascular;  Laterality: N/A;    Current Outpatient Prescriptions    Medication Sig Dispense Refill  . amiodarone (PACERONE) 200 MG tablet Take 1 tablet (200 mg total) by mouth 2 (two) times daily. 60 tablet 11  . amLODipine (NORVASC) 10 MG tablet Take 10 mg by mouth daily.    [name] aspirin EC 325 MG EC tablet Take 1 tablet (325 mg total) by mouth daily.    . citalopram (CELEXA) 10 MG tablet Take 10 mg by mouth daily. Reported on 06/06/2016    . furosemide (LASIX) 20 MG tablet Take 3 tablets (60 mg total) by mouth daily. 90 tablet 11  . ibuprofen (ADVIL,MOTRIN) 200 MG tablet Take 400 mg by mouth every 6 (six) hours as needed (For pain.).    [name] lisinopril (PRINIVIL,ZESTRIL) 10 MG tablet Take 10 mg by mouth daily.     . metoprolol succinate (TOPROL XL) 25 MG 24 hr tablet Take 1 tablet (25 mg total) by mouth daily. 90 tablet 3  . Multiple Vitamin (MULITIVITAMIN WITH MINERALS) TABS Take 1 tablet by mouth daily.    . niacin (NIASPAN) 500 MG CR tablet Take 1,000 mg by mouth 2 (two) times daily.     . nitroGLYCERIN (NITROSTAT) 0.4 MG SL tablet Place 1 tablet (0.4 mg total) under the tongue every 5 (five) minutes as needed for chest pain. 100 tablet 3  . potassium chloride SA (K-DUR,KLOR-CON) 20 MEQ tablet Take 80 mEq by mouth 2 (two) times daily.     . pravastatin (PRAVACHOL) 80 MG tablet TAKE 1 TABLET EVERY DAY 90 tablet 0  . clopidogrel (PLAVIX) 75 MG tablet Take 1 tablet (75 mg total) by mouth daily. 30 tablet 0  . clopidogrel (PLAVIX) 75 MG tablet Take 1 tablet (75 mg total) by mouth daily. 90 tablet 3   No current facility-administered medications for this visit.     Allergies:   Buprenorphine hcl and Morphine and related   Social History: Social History   Social History  . Marital status: Widowed    Spouse name: N/A  . Number of children: 3  . Years of education: 104 TH   Occupational History  . RETIRED---truck driver Retired   Social History Main Topics  . Smoking status: Current Every Day Smoker    Packs/day: 0.50    Years: 48.00    Types: Cigarettes     Last attempt to quit: 12/11/2004  . Smokeless tobacco: Never Used  . Alcohol use 0.0 oz/week     Comment: 06/01/2016 "haven't had a drink in years"  . Drug use: No  . Sexual activity: Yes   Other Topics Concern  . Not on file   Social History Narrative   Patient is married with 3 children.   Patient is right handed.   Patient has 10 th grade education.   Patient drinks tea occasionally.    Family History: Family History  Problem Relation Age of Onset  . Stroke Maternal Uncle   . Stroke Maternal Uncle   . Heart attack Neg Hx   . Hypertension Neg Hx     Review of Systems: All other systems reviewed and are  otherwise negative except as noted above.   Physical Exam: VS:  BP 120/70   Pulse 72   Ht 6\' 1"  (1.854 m)   Wt 191 lb 9.6 oz (86.9 kg)   BMI 25.28 kg/m  , BMI Body mass index is 25.28 kg/m.  GEN- The patient is elderly appearing, alert and oriented x 3 today.   HEENT: normocephalic, atraumatic; sclera clear, conjunctiva pink; hearing intact; oropharynx clear; neck supple, +JVP Lungs- Clear to ausculation bilaterally, normal work of breathing.  No wheezes, rales, rhonchi Heart- Irregular rate and rhythm, no murmurs, rubs or gallops  GI- soft, non-tender, non-distended, bowel sounds present  Extremities- no clubbing, cyanosis, or edema; DP/PT/radial pulses 2+ bilaterally MS- no significant deformity or atrophy Skin- warm and dry, no rash or lesion; ICD pocket well healed Psych- euthymic mood, full affect Neuro- strength and sensation are intact  ICD interrogation- reviewed in detail today,  See PACEART report  EKG:  EKG is not ordered today.  Recent Labs: 03/15/2016: Magnesium 1.7 03/28/2016: Brain Natriuretic Peptide 1,794.7 05/10/2016: ALT 9; TSH 0.84 07/10/2016: BUN 33; Creat 1.72; Platelets 278 07/26/2016: Hemoglobin 13.3; Potassium 5.0; Sodium 144   Wt Readings from Last 3 Encounters:  08/09/16 191 lb 9.6 oz (86.9 kg)  08/07/16 198 lb (89.8 kg)  07/26/16  182 lb (82.6 kg)     Other studies Reviewed: Additional studies/ records that were reviewed today include: Dr Jenel LucksAllred's office notes, hospital records  Assessment and Plan:  1.  Persistent atrial fibrillation Doing well s/p Watchman  6 week TEE with well seated device and no significant jet Back in AF post DCCV despite amio re-loading. Will stop Pradaxa at this time. Start Plavix 75mg  daily, increase ASA to 325mg  daily  He has failed rhythm control with amiodarone and repeat cardioversion.  His AF is limiting his CRT pacing and causing worsening HF.  Treatment options discussed with patient today including AVN ablation. Risks, benefits reviewed with patient, daughter, son-in-law who wish to proceed.  LFT's, TSH stable 04/2016. Annual eye exams recommended.  2. Chronic systolic dysfunction Slightly volume overloaded on exam  Normal ICD function - CRT pacing 41% with AF See Pace Art report No changes today  3.  HTN Stable No change required today  Current medicines are reviewed at length with the patient today.   The patient does not have concerns regarding his medicines.  The following changes were made today:  Stop Pradaxa, start Plavix 75mg  daily, increase ASA to 325mg  daily   Labs/ tests ordered today include:  Orders Placed This Encounter  Procedures  . Basic metabolic panel  . CBC with Differential/Platelet  . Protime-INR    Disposition:   Follow up with Dr Johney FrameAllred or me post AVN ablation   Signed, Gypsy BalsamAmber Saul Fabiano, NP  08/09/2016 11:24 AM  [facility] [address] [place] [address] [phone] (office) [phone] (fax)

## 2016-08-17 NOTE — Interval H&P Note (Signed)
History and Physical Interval Note:  08/17/2016 2:22 PM  The patient has acute on chronic systolic dysfunction, exacerbated by AF with RVR.  He has failed medical therapy with amiodarone.  He is not an ablation candidate due to severe LA enlargement and chronic lung disease.  I have recommended AV nodal ablation to allow for better CRT. Risk, benefits, and alternatives to radiofrequency ablation were also discussed in detail today. These risks include but are not limited to stroke, bleeding, vascular damage, tamponade, perforation, damage to the heart and other structures, ICD lead dislodgement, worsening renal function, and death. The patient understands these risk and wishes to proceed.     [name] [name]  has presented today for surgery, with the diagnosis of afib  The various methods of treatment have been discussed with the patient and family. After consideration of risks, benefits and other options for treatment, the patient has consented to  Procedure(s): AV Node Ablation (N/A) as a surgical intervention .  The patient's history has been reviewed, patient examined, no change in status, stable for surgery.  I have reviewed the patient's chart and labs.  Questions were answered to the patient's satisfaction.     [name]

## 2016-08-17 NOTE — Progress Notes (Signed)
Site area: rt groin fv sheath Site Prior to Removal:  Level 0 Pressure Applied For:  15 minutes Manual:   yes Patient Status During Pull:  stable Post Pull Site:  Level  0 Post Pull Instructions Given:  yes Post Pull Pulses Present: yes Dressing Applied:  Small tegaderm Bedrest begins @ 1700 Comments:

## 2016-08-18 ENCOUNTER — Encounter (HOSPITAL_COMMUNITY): Payer: Self-pay | Admitting: Internal Medicine

## 2016-08-18 DIAGNOSIS — I447 Left bundle-branch block, unspecified: Secondary | ICD-10-CM | POA: Diagnosis not present

## 2016-08-18 DIAGNOSIS — I480 Paroxysmal atrial fibrillation: Secondary | ICD-10-CM | POA: Diagnosis not present

## 2016-08-18 DIAGNOSIS — I482 Chronic atrial fibrillation: Secondary | ICD-10-CM | POA: Diagnosis not present

## 2016-08-18 DIAGNOSIS — I495 Sick sinus syndrome: Secondary | ICD-10-CM | POA: Diagnosis not present

## 2016-08-18 DIAGNOSIS — I481 Persistent atrial fibrillation: Secondary | ICD-10-CM

## 2016-08-18 DIAGNOSIS — I5023 Acute on chronic systolic (congestive) heart failure: Secondary | ICD-10-CM

## 2016-08-18 NOTE — Progress Notes (Signed)
Patient is alert and oriented, vital signs are stable, discharge instructions reviewed with patient, patient to follow up with NP for defib check Stanford Breed RN 10:57 AM 08-18-2016

## 2016-08-18 NOTE — Care Management Note (Signed)
Case Management Note  Patient Details  Name: [name] [name] MRN: [id number] Date of Birth: 1941-01-22  Subjective/Objective:       Afib, CHF, AVN ablation             Action/Plan: Discharge Planning: AVS reviewed: NCM spoke to pt and states he lives at home alone. He was independent prior to hospital stay. States he has family that can assist him at home and drive to his appts.   PCP-  Gordan Payment MD   Expected Discharge Date: 08/18/2016           Expected Discharge Plan:  Home/Self Care  In-House Referral:  NA  Discharge planning Services  CM Consult  Post Acute Care Choice:  NA Choice offered to:  NA  DME Arranged:  N/A DME Agency:  NA  HH Arranged:  NA HH Agency:  NA  Status of Service:  Completed, signed off  If discussed at Long Length of Stay Meetings, dates discussed:    Additional Comments:  Elliot Cousin, RN 08/18/2016, 9:42 AM

## 2016-08-18 NOTE — Discharge Summary (Signed)
ELECTROPHYSIOLOGY PROCEDURE DISCHARGE SUMMARY    Patient ID: [name] [name],  MRN: [id number], DOB/AGE: Feb 23, 1941 75 y.o.  Admit date: 08/17/2016 Discharge date: 08/18/2016  Primary Care Physician: Jens Somrenshaw Primary Cardiologist: Afiya Ferrebee  Primary Discharge Diagnosis:  Active Problems:   Acute on chronic systolic (congestive) heart failure (HCC)   Atrial fibrillation (HCC)   Persistent atrial fibrillation (HCC)  Secondary Diagnosis: 1.  Hypertension 2.  Hyperlipidemia 3.  ICM 4.  CAD 5.  PriorCVA  Allergies  Allergen Reactions  . Buprenorphine Hcl Other (See Comments)    Patient stated that he did not do well on this medication. He felt like climbing the walls  . Morphine And Related Other (See Comments)    Patient stated that he did not do well on this medication. He felt like climbing the walls    Procedures This Admission:  1. AVN ablation on 08/17/16 by Dr Johney FrameAllred. This study demonstrated successful AVN ablation with escape rate of 45bpm.  There were no early apparent complications   Brief HPI:  [name] [name] is a 75 y.o. male with a past medical history as outlined above.  He has persistent atrial fibrillation refractory to amiodarone and cardioversion resulting in decreased CRT pacing.  He has undergone Watchman procedure for stroke prevention.  Risks, benefits to AVN ablation were discussed with the patient who wished to proceed.   Hospital Course:  [name] [name] is a 75 y.o. male was admitted and underwent AVN ablation with details as outlined above. He was monitored on telemetry overnight which demonstrated atrial fibrillation with ventricular pacing.  His groin was without complication.  He was examined by Dr Johney FrameAllred and considered stable for discharge to home.   Physical Exam: Vitals:   08/18/16 0000 08/18/16 0342 08/18/16 0355 08/18/16 0758  BP: (!) 111/58  128/85 (!) 134/91  Pulse: 80  81 82  Resp: 20  20 18   Temp: 98.4 F (36.9 C)  97.6 F (36.4  C) 97.9 F (36.6 C)  TempSrc: Oral  Oral Oral  SpO2: 94%  97% 95%  Weight:  181 lb 14.4 oz (82.5 kg)    Height:        GEN- The patient is elderly appearing, alert and oriented x 3 today.   HEENT: normocephalic, atraumatic; sclera clear, conjunctiva pink; hearing intact; oropharynx clear; neck supple  Lungs- Clear to ausculation bilaterally, normal work of breathing.  No wheezes, rales, rhonchi Heart- Regular rate and rhythm (paced) GI- soft, non-tender, non-distended, bowel sounds present  Extremities- no clubbing, cyanosis, or edema; DP/PT/radial pulses 2+ bilaterally MS- no significant deformity or atrophy Skin- warm and dry, no rash or lesion Psych- euthymic mood, full affect Neuro- strength and sensation are intact    Labs:   Lab Results  Component Value Date   WBC 5.6 08/15/2016   HGB 11.9 (L) 08/15/2016   HCT 38.5 08/15/2016   MCV 85.7 08/15/2016   PLT 137 (L) 08/15/2016     Recent Labs Lab 08/15/16 0902  NA 146  K 3.6  CL 109  CO2 26  BUN 21  CREATININE 1.23*  CALCIUM 8.8  GLUCOSE 90     Discharge Medications:  Current Discharge Medication List    CONTINUE these medications which have NOT CHANGED   Details  amLODipine (NORVASC) 10 MG tablet Take 10 mg by mouth daily.    aspirin EC 325 MG EC tablet Take 1 tablet (325 mg total) by mouth daily.    citalopram (CELEXA) 10  MG tablet Take 10 mg by mouth daily. Reported on 06/06/2016    !! clopidogrel (PLAVIX) 75 MG tablet Take 1 tablet (75 mg total) by mouth daily. Qty: 30 tablet, Refills: 0    furosemide (LASIX) 20 MG tablet Take 3 tablets (60 mg total) by mouth daily. Qty: 90 tablet, Refills: 11   Associated Diagnoses: Acute on chronic systolic (congestive) heart failure (HCC)    ibuprofen (ADVIL,MOTRIN) 200 MG tablet Take 400 mg by mouth every 6 (six) hours as needed (For pain.).    lisinopril (PRINIVIL,ZESTRIL) 10 MG tablet Take 10 mg by mouth daily.     metoprolol succinate (TOPROL XL) 25 MG 24  hr tablet Take 1 tablet (25 mg total) by mouth daily. Qty: 90 tablet, Refills: 3   Associated Diagnoses: Medication management    Multiple Vitamin (MULITIVITAMIN WITH MINERALS) TABS Take 1 tablet by mouth daily.    niacin (NIASPAN) 500 MG CR tablet Take 1,000 mg by mouth 2 (two) times daily.     potassium chloride SA (K-DUR,KLOR-CON) 20 MEQ tablet Take 80 mEq by mouth 2 (two) times daily.    Associated Diagnoses: Permanent atrial fibrillation (HCC); Chronic systolic heart failure (HCC); Essential hypertension    pravastatin (PRAVACHOL) 80 MG tablet TAKE 1 TABLET EVERY DAY Qty: 90 tablet, Refills: 0    !! clopidogrel (PLAVIX) 75 MG tablet Take 1 tablet (75 mg total) by mouth daily. Qty: 90 tablet, Refills: 3    nitroGLYCERIN (NITROSTAT) 0.4 MG SL tablet Place 1 tablet (0.4 mg total) under the tongue every 5 (five) minutes as needed for chest pain. Qty: 100 tablet, Refills: 3     !! - Potential duplicate medications found. Please discuss with provider.    STOP taking these medications     amiodarone (PACERONE) 200 MG tablet         Disposition: Pt is being discharged home today in good condition. Discharge Instructions    Diet - low sodium heart healthy    Complete by:  As directed   Increase activity slowly    Complete by:  As directed     Follow-up Information    [facility] Office Follow up on 09/07/2016.   Specialty:  Cardiology Why:  at 8:20AM Contact information: [address] [place] [address] [phone]          Duration of Discharge Encounter: Greater than 30 minutes including physician time.  Signed, Gypsy Balsam, NP 08/18/2016 8:05 AM    I have seen, examined the patient, and reviewed the above assessment and plan.  On exam, RRR (paced) without return of AV nodal conduction.  Changes to above are made where necessary.    Co Sign: [name], MD 08/18/2016 10:42 AM

## 2016-08-21 ENCOUNTER — Encounter: Payer: Self-pay | Admitting: Cardiology

## 2016-08-25 ENCOUNTER — Encounter: Payer: Self-pay | Admitting: Nurse Practitioner

## 2016-08-28 LAB — CUP PACEART REMOTE DEVICE CHECK
Battery Remaining Percentage: 47 %
Battery Voltage: 2.92 V
Brady Statistic AP VP Percent: 14 %
Brady Statistic AS VP Percent: 23 %
Date Time Interrogation Session: 20170907080018
HIGH POWER IMPEDANCE MEASURED VALUE: 54 Ohm
HighPow Impedance: 54 Ohm
Implantable Lead Location: 753858
Implantable Lead Location: 753859
Implantable Lead Location: 753860
Lead Channel Impedance Value: 290 Ohm
Lead Channel Impedance Value: 380 Ohm
Lead Channel Pacing Threshold Pulse Width: 0.5 ms
Lead Channel Pacing Threshold Pulse Width: 0.7 ms
Lead Channel Sensing Intrinsic Amplitude: 11.7 mV
Lead Channel Setting Pacing Pulse Width: 0.7 ms
Lead Channel Setting Sensing Sensitivity: 0.5 mV
MDC IDC LEAD IMPLANT DT: 20130912
MDC IDC LEAD IMPLANT DT: 20130912
MDC IDC LEAD IMPLANT DT: 20130912
MDC IDC MSMT BATTERY REMAINING LONGEVITY: 31 mo
MDC IDC MSMT LEADCHNL LV PACING THRESHOLD AMPLITUDE: 2 V
MDC IDC MSMT LEADCHNL RA IMPEDANCE VALUE: 400 Ohm
MDC IDC MSMT LEADCHNL RA PACING THRESHOLD AMPLITUDE: 1.125 V
MDC IDC MSMT LEADCHNL RA PACING THRESHOLD PULSEWIDTH: 0.5 ms
MDC IDC MSMT LEADCHNL RA SENSING INTR AMPL: 4.5 mV
MDC IDC MSMT LEADCHNL RV PACING THRESHOLD AMPLITUDE: 0.5 V
MDC IDC PG SERIAL: 1075181
MDC IDC SET LEADCHNL LV PACING AMPLITUDE: 3 V
MDC IDC SET LEADCHNL RA PACING AMPLITUDE: 2.125
MDC IDC SET LEADCHNL RV PACING AMPLITUDE: 2 V
MDC IDC SET LEADCHNL RV PACING PULSEWIDTH: 0.5 ms
MDC IDC STAT BRADY AP VS PERCENT: 6.8 %
MDC IDC STAT BRADY AS VS PERCENT: 55 %
MDC IDC STAT BRADY RA PERCENT PACED: 8.5 %

## 2016-09-06 NOTE — Progress Notes (Signed)
Electrophysiology Office Note Date: 09/07/2016  ID:  [name] [name], DOB 1941/02/28, MRN [id number]  PCP: [name] Rossetti, MD Primary Cardiologist: [name] [name] Electrophysiologist: [name]  CC: follow up AVN ablation  [name] [name] is a 75 y.o. male seen today post AVN ablation.  Since discharge, the patient reports doing very well. He has noticed significant improvement in shortness of breath and exercise tolerance since AVN ablation. He is now able to climb the hill to his fiance's house without stopping to rest. He denies chest pain, palpitations, dyspnea, PND, orthopnea, nausea, vomiting, dizziness, syncope, edema, or early satiety.  He has not had ICD shocks.   Device History: STJ CRTD implanted 2013 for ICM, chronic systolic heart failure History of appropriate therapy: No History of AAD therapy: Yes - amiodarone for AF    Past Medical History:  Diagnosis Date  . Aortic dissection (HCC)    H/O focal aortic dissection  . CAD (coronary artery disease)    a. s/p CABG;  cath 10/07:  LM ok, LAD occluded, AV CFX 95%, pRCA occluded; L-LAD, S-OM2/OM3, S-PDA ok  . Cerebrovascular disease    a. s/p prior Left CEA;  followed by Dr. Arbie [name]  . CHF (congestive heart failure) (HCC)    a. s/p STJ CRTD  . CKD (chronic kidney disease)   . COPD (chronic obstructive pulmonary disease) (HCC)   . Depression   . DVT (deep venous thrombosis) (HCC)   . History of stomach ulcers   . Hyperlipidemia   . Hypertension   . Iliac artery aneurysm, left (HCC)    followed by Dr. Arbie [name]  . Ischemic cardiomyopathy    a. EF 25% by last myoview in 11/2015. s/p CRT-D  . LBBB (left bundle branch block)   . Myocardial infarction (HCC) 1997  . Nephrolithiasis   . PAF (paroxysmal atrial fibrillation) (HCC)    a. on amio and pradaxa. b. s/p AVN ablation  . Stroke [facility]) 12/2015 "several mini"   a. on coumadin --> switched to pradaxa b. s/p Watchman   Past Surgical History:  Procedure Laterality Date  .  ABDOMINAL AORTIC ANEURYSM REPAIR N/A 01/03/2016   Procedure: ANEURYSM ABDOMINAL AORTA, BILATERAL COMMON ILIAC REPAIR;  Surgeon: [name] Earthly, MD;  Location: [facility] OR;  Service: Vascular;  Laterality: N/A;  . APPENDECTOMY  09/11/11  . BACK SURGERY    . BI-VENTRICULAR IMPLANTABLE CARDIOVERTER DEFIBRILLATOR N/A 08/22/2012   SJM Quadra Assura BiV ICD implanted by Dr [name] [name]  . CARDIAC CATHETERIZATION  1998; 2013  . CARDIOVERSION N/A 07/26/2016   Procedure: CARDIOVERSION;  Surgeon: [name] Masson, MD;  Location: [facility] ENDOSCOPY;  Service: Cardiovascular;  Laterality: N/A;  . CAROTID ENDARTERECTOMY Left July  2005  . CORONARY ARTERY BYPASS GRAFT  1998   CABG X5  . CYSTOSCOPY W/ STONE MANIPULATION  X 2  . ELECTROPHYSIOLOGIC STUDY N/A 08/17/2016   AVN ablation by Dr [name] [name]  . ESOPHAGOGASTRODUODENOSCOPY N/A 02/28/2016   Procedure: ESOPHAGOGASTRODUODENOSCOPY (EGD);  Surgeon: [name] Dare, MD;  Location: [facility] ENDOSCOPY;  Service: Endoscopy;  Laterality: N/A;  . LEFT ATRIAL APPENDAGE OCCLUSION N/A 06/01/2016   Watchman device implanted by Dr [name] [name]  . POSTERIOR FUSION LUMBAR SPINE  Sep 27, 2014   L4-L5  . TEE WITHOUT CARDIOVERSION N/A 07/26/2016   Procedure: TRANSESOPHAGEAL ECHOCARDIOGRAM (TEE);  Surgeon: [name] Masson, MD;  Location: [facility] ENDOSCOPY;  Service: Cardiovascular;  Laterality: N/A;    Current Outpatient Prescriptions  Medication Sig Dispense Refill  . amLODipine (NORVASC) 10 MG tablet Take 10  mg by mouth daily.    [name] aspirin EC 325 MG EC tablet Take 1 tablet (325 mg total) by mouth daily.    . citalopram (CELEXA) 10 MG tablet Take 10 mg by mouth daily. Reported on 06/06/2016    . clopidogrel (PLAVIX) 75 MG tablet Take 1 tablet (75 mg total) by mouth daily. 30 tablet 0  . furosemide (LASIX) 20 MG tablet Take 3 tablets (60 mg total) by mouth daily. 90 tablet 11  . ibuprofen (ADVIL,MOTRIN) 200 MG tablet Take 400 mg by mouth every 6 (six) hours as needed (For pain.).    [name] lisinopril  (PRINIVIL,ZESTRIL) 10 MG tablet Take 10 mg by mouth daily.     . metoprolol succinate (TOPROL XL) 25 MG 24 hr tablet Take 1 tablet (25 mg total) by mouth daily. 90 tablet 3  . Multiple Vitamin (MULITIVITAMIN WITH MINERALS) TABS Take 1 tablet by mouth daily.    . niacin (NIASPAN) 500 MG CR tablet Take 1,000 mg by mouth 2 (two) times daily.     . nitroGLYCERIN (NITROSTAT) 0.4 MG SL tablet Place 1 tablet (0.4 mg total) under the tongue every 5 (five) minutes as needed for chest pain. 100 tablet 3  . potassium chloride SA (K-DUR,KLOR-CON) 20 MEQ tablet Take 80 mEq by mouth 2 (two) times daily.     . pravastatin (PRAVACHOL) 80 MG tablet Take 80 mg by mouth daily.     No current facility-administered medications for this visit.     Allergies:   Buprenorphine hcl and Morphine and related   Social History: Social History   Social History  . Marital status: Widowed    Spouse name: N/A  . Number of children: 3  . Years of education: 37 TH   Occupational History  . RETIRED---truck driver Retired   Social History Main Topics  . Smoking status: Current Every Day Smoker    Packs/day: 0.50    Years: 48.00    Types: Cigarettes    Last attempt to quit: 12/11/2004  . Smokeless tobacco: Never Used  . Alcohol use 0.0 oz/week     Comment: 06/01/2016 "haven't had a drink in years"  . Drug use: No  . Sexual activity: Yes   Other Topics Concern  . Not on file   Social History Narrative   Patient is married with 3 children.   Patient is right handed.   Patient has 10 th grade education.   Patient drinks tea occasionally.    Family History: Family History  Problem Relation Age of Onset  . Stroke Maternal Uncle   . Stroke Maternal Uncle   . Heart attack Neg Hx   . Hypertension Neg Hx     Review of Systems: All other systems reviewed and are otherwise negative except as noted above.   Physical Exam: VS:  BP 126/76   Pulse 65   Ht 6\' 1"  (1.854 m)   Wt 181 lb 3.2 oz (82.2 kg)   SpO2  97%   BMI 23.91 kg/m  , BMI Body mass index is 23.91 kg/m.  GEN- The patient is elderly appearing, alert and oriented x 3 today.   HEENT: normocephalic, atraumatic; sclera clear, conjunctiva pink; hearing intact; oropharynx clear; neck supple  Lungs- Clear to ausculation bilaterally, normal work of breathing.  No wheezes, rales, rhonchi Heart- Regular rate and rhythm (paced) GI- soft, non-tender, non-distended, bowel sounds present  Extremities- no clubbing, cyanosis, or edema; DP/PT/radial pulses 2+ bilaterally MS- no significant deformity or atrophy Skin-  warm and dry, no rash or lesion; ICD pocket well healed Psych- euthymic mood, full affect Neuro- strength and sensation are intact  ICD interrogation- reviewed in detail today,  See PACEART report  EKG:  EKG is not ordered today.  Recent Labs: 03/15/2016: Magnesium 1.7 03/28/2016: Brain Natriuretic Peptide 1,794.7 05/10/2016: ALT 9; TSH 0.84 08/15/2016: BUN 21; Creat 1.23; Hemoglobin 11.9; Platelets 137; Potassium 3.6; Sodium 146   Wt Readings from Last 3 Encounters:  09/07/16 181 lb 3.2 oz (82.2 kg)  08/18/16 181 lb 14.4 oz (82.5 kg)  08/09/16 191 lb 9.6 oz (86.9 kg)     Other studies Reviewed: Additional studies/ records that were reviewed today include: Dr Jenel LucksAllred's office notes, hospital records  Assessment and Plan:  1.  Persistent atrial fibrillation Doing well s/p Watchman  6 week TEE with well seated device and no significant jet Continue Plavix/ASA until 11/2016 - 6 months post Watchman then stop Plavix  2. Chronic systolic dysfunction Euvolemic on exam Normal ICD function - CRT pacing >99% with AVN ablation - pt is device dependent today Rate lowered to 70 today and rate response turned on.  See Pace Art report No changes today Pt is significantly improved post AVN ablation   3.  HTN Stable No change required today  Current medicines are reviewed at length with the patient today.   The patient does not  have concerns regarding his medicines.  The following changes were made today:  none  Labs/ tests ordered today include:  No orders of the defined types were placed in this encounter.   Disposition:   Follow up with me 3 months   Signed, Gypsy BalsamAmber Selicia Windom, NP  09/07/2016 8:47 AM  [facility] [address] [place] [address] [phone] (office) [phone] (fax)

## 2016-09-07 ENCOUNTER — Ambulatory Visit (INDEPENDENT_AMBULATORY_CARE_PROVIDER_SITE_OTHER): Payer: Medicare Other | Admitting: Nurse Practitioner

## 2016-09-07 ENCOUNTER — Encounter: Payer: Self-pay | Admitting: Internal Medicine

## 2016-09-07 ENCOUNTER — Encounter: Payer: Self-pay | Admitting: Nurse Practitioner

## 2016-09-07 VITALS — BP 126/76 | HR 65 | Ht 73.0 in | Wt 181.2 lb

## 2016-09-07 DIAGNOSIS — I4821 Permanent atrial fibrillation: Secondary | ICD-10-CM

## 2016-09-07 DIAGNOSIS — I5022 Chronic systolic (congestive) heart failure: Secondary | ICD-10-CM | POA: Diagnosis not present

## 2016-09-07 DIAGNOSIS — I255 Ischemic cardiomyopathy: Secondary | ICD-10-CM

## 2016-09-07 DIAGNOSIS — I10 Essential (primary) hypertension: Secondary | ICD-10-CM

## 2016-09-07 DIAGNOSIS — I482 Chronic atrial fibrillation: Secondary | ICD-10-CM | POA: Diagnosis not present

## 2016-09-07 DIAGNOSIS — I2589 Other forms of chronic ischemic heart disease: Secondary | ICD-10-CM

## 2016-09-07 LAB — CUP PACEART INCLINIC DEVICE CHECK
Date Time Interrogation Session: 20170928085315
Implantable Lead Implant Date: 20130912
Implantable Lead Implant Date: 20130912
Implantable Lead Location: 753859
MDC IDC LEAD IMPLANT DT: 20130912
MDC IDC LEAD LOCATION: 753858
MDC IDC LEAD LOCATION: 753860
Pulse Gen Serial Number: 1075181

## 2016-09-07 NOTE — Patient Instructions (Addendum)
Medication Instructions:  Your physician recommends that you continue on your current medications as directed. Please refer to the Current Medication list given to you today.   Labwork: None ordered   Testing/Procedures: None ordered   Follow-Up: Your physician recommends that you schedule a follow-up appointment in: 3 months with Amber Seiler, NP   Any Other Special Instructions Will Be Listed Below (If Applicable).     If you need a refill on your cardiac medications before your next appointment, please call your pharmacy.   

## 2016-09-12 ENCOUNTER — Ambulatory Visit (INDEPENDENT_AMBULATORY_CARE_PROVIDER_SITE_OTHER): Payer: Medicare Other

## 2016-09-12 DIAGNOSIS — I5022 Chronic systolic (congestive) heart failure: Secondary | ICD-10-CM | POA: Diagnosis not present

## 2016-09-12 DIAGNOSIS — Z95810 Presence of automatic (implantable) cardiac defibrillator: Secondary | ICD-10-CM | POA: Diagnosis not present

## 2016-09-12 NOTE — Progress Notes (Signed)
EPIC Encounter for ICM Monitoring  Patient Name: [name] [name] is a 75 y.o. male Date: 09/12/2016 Primary Care Physican: [name], MD Primary Cardiologist: [name] Electrophysiologist: [name] Dry Weight:    180.4 lb  Bi-V Pacing:  >99%      AT/AF Burden >99%      Heart Failure questions reviewed, pt asymptomatic   Thoracic impedance normal   Recommendations: No changes.  Low sodium diet education provided.    Follow-up plan: ICM clinic phone appointment on 10/16/2016.  Copy of ICM check sent to device physician.   ICM trend: 09/12/2016       Karie Soda, RN 09/12/2016 9:09 AM

## 2016-09-15 ENCOUNTER — Other Ambulatory Visit: Payer: Self-pay | Admitting: Cardiology

## 2016-09-15 DIAGNOSIS — I4821 Permanent atrial fibrillation: Secondary | ICD-10-CM

## 2016-09-15 DIAGNOSIS — I10 Essential (primary) hypertension: Secondary | ICD-10-CM

## 2016-09-15 DIAGNOSIS — I5022 Chronic systolic (congestive) heart failure: Secondary | ICD-10-CM

## 2016-10-16 ENCOUNTER — Ambulatory Visit (INDEPENDENT_AMBULATORY_CARE_PROVIDER_SITE_OTHER): Payer: Medicare Other

## 2016-10-16 ENCOUNTER — Telehealth: Payer: Self-pay

## 2016-10-16 DIAGNOSIS — I5022 Chronic systolic (congestive) heart failure: Secondary | ICD-10-CM

## 2016-10-16 DIAGNOSIS — Z95810 Presence of automatic (implantable) cardiac defibrillator: Secondary | ICD-10-CM

## 2016-10-16 NOTE — Progress Notes (Signed)
EPIC Encounter for ICM Monitoring  Patient Name: [name] [name] is a 75 y.o. male Date: 10/16/2016 Primary Care Physican: [name], MD Primary Cardiologist:Crenshaw Electrophysiologist: [name] Dry Weight:180.4lb  Bi-V Pacing: >99% AT/AF Burden >99%       Attempted ICM call and unable to reach. Left detailed message regarding transmission.  Transmission reviewed.   Thoracic impedance normal   Follow-up plan: ICM clinic phone appointment on 11/16/2016.  Copy of ICM check sent to device physician.   ICM trend: 10/16/2016   AT/AF    Karie Soda, RN 10/16/2016 11:53 AM

## 2016-10-16 NOTE — Telephone Encounter (Signed)
Remote ICM transmission received.  Attempted patient call and left detailed message regarding transmission and next ICM scheduled for 11/16/2016.  Advised to return call for any fluid symptoms or questions.    

## 2016-10-31 ENCOUNTER — Ambulatory Visit: Payer: Medicare Other | Admitting: Cardiology

## 2016-11-01 ENCOUNTER — Other Ambulatory Visit: Payer: Self-pay | Admitting: Emergency Medicine

## 2016-11-01 NOTE — Progress Notes (Signed)
HPI: Fu CAD s/p CABG, ischemic CM, combined systolic and diastolic CHF, AF, COPD, s/p CRT-D, s/p AV node ablation.He also has a history of focal aortic dissection being managed medically. He also has a hx of CEA. He underwent aortic and iliac artery aneurysm repair by Dr. [name] in 1/17 with a aorta to bilateral CIA bypass. Watchman device implant (placed 6/17).   Since last seen, he has mild dyspnea on exertion but no orthopnea, PND, pedal edema, chest pain or syncope.  Myoview 12/16 EF 25%, inf scar, no ischemia, Intermediate Risk due to reduced EF  Carotid US 11/16 R < 40%; L CEA patent with < 40%  TEE 8/17 EF 25-30; mild to moderate MR; watchman device in place with residual lobe not covered by device   LHC 8/13 Left mainstem: LM normal.  Left anterior descending (LAD): 100 proximal. LAD fills via LIMA. No high grade distal disease. D1 moderated sized and fills via SVG. Left circumflex (LCx): AV groove diffuse luminal irregularities. Mid 25%. Small OM2 with ostial 95%. MOM with subtotal stenosis. There is filling of two moderate sized OMs with SVG. Both of these vessels are occluded proximally but demonstrated no high grade disease after the graft anastomosis.  Right coronary artery (RCA): Occluded proximally. PDA is moderate sized and fills via SVG. Two moderate sized PLs backfill via this graft and are free if disease. Grafts:  LIMA: Patent to the LAD. SVG to Diag: Patent with proximal circumferential 50% proximal stenosis and mild luminal irregularities. SVG to OM x 2: Patent with mild luminal irregularities. SVG to RCA: Mild luminal irregularities with mid 40% stenosis. Left ventriculography: Left ventricular systolic function is reduced, LVEF is estimated at 35%, there is no significant mitral regurgitation.   Current Outpatient Prescriptions  Medication Sig Dispense Refill  . amLODipine (NORVASC) 10 MG tablet Take 10 mg by mouth daily.     [name] aspirin EC 325 MG EC tablet Take 1 tablet (325 mg total) by mouth daily.    . citalopram (CELEXA) 10 MG tablet Take 10 mg by mouth daily. Reported on 06/06/2016    . clopidogrel (PLAVIX) 75 MG tablet Take 1 tablet (75 mg total) by mouth daily. 30 tablet 0  . furosemide (LASIX) 20 MG tablet Take 3 tablets (60 mg total) by mouth daily. 90 tablet 11  . ibuprofen (ADVIL,MOTRIN) 200 MG tablet Take 400 mg by mouth every 6 (six) hours as needed (For pain.).    [name] lisinopril (PRINIVIL,ZESTRIL) 10 MG tablet Take 10 mg by mouth daily.     . metoprolol succinate (TOPROL XL) 25 MG 24 hr tablet Take 1 tablet (25 mg total) by mouth daily. 90 tablet 3  . Multiple Vitamin (MULITIVITAMIN WITH MINERALS) TABS Take 1 tablet by mouth daily.    . niacin (NIASPAN) 500 MG CR tablet Take 1,000 mg by mouth 2 (two) times daily.     . nitroGLYCERIN (NITROSTAT) 0.4 MG SL tablet Place 1 tablet (0.4 mg total) under the tongue every 5 (five) minutes as needed for chest pain. 100 tablet 3  . potassium chloride SA (K-DUR,KLOR-CON) 20 MEQ tablet TAKE 2 TABLETS THREE TIMES DAILY. PLEASE SCHEDULE APPOINTMENT FOR REFILLS. 540 tablet 1  . pravastatin (PRAVACHOL) 80 MG tablet Take 80 mg by mouth daily.     No current facility-administered medications for this visit.      Past Medical History:  Diagnosis Date  . Aortic dissection (HCC)    H/O focal aortic dissection  .  CAD (coronary artery disease)    a. s/p CABG;  cath 10/07:  LM ok, LAD occluded, AV CFX 95%, pRCA occluded; L-LAD, S-OM2/OM3, S-PDA ok  . Cerebrovascular disease    a. s/p prior Left CEA;  followed by Dr. Arbie CookeyEarly  . CHF (congestive heart failure) (HCC)    a. s/p STJ CRTD  . CKD (chronic kidney disease)   . COPD (chronic obstructive pulmonary disease) (HCC)   . Depression   . DVT (deep venous thrombosis) (HCC)   . History of stomach ulcers   . Hyperlipidemia   . Hypertension   . Iliac artery aneurysm, left (HCC)    followed by Dr. Arbie CookeyEarly  . Ischemic  cardiomyopathy    a. EF 25% by last myoview in 11/2015. s/p CRT-D  . LBBB (left bundle branch block)   . Myocardial infarction 1997  . Nephrolithiasis   . PAF (paroxysmal atrial fibrillation) (HCC)    a. on amio and pradaxa. b. s/p AVN ablation  . Stroke [facility](HCC) 12/2015 "several mini"   a. on coumadin --> switched to pradaxa b. s/p Watchman    Past Surgical History:  Procedure Laterality Date  . ABDOMINAL AORTIC ANEURYSM REPAIR N/A 01/03/2016   Procedure: ANEURYSM ABDOMINAL AORTA, BILATERAL COMMON ILIAC REPAIR;  Surgeon: Larina Earthlyodd F Early, MD;  [facility];  Service: Vascular;  Laterality: N/A;  . APPENDECTOMY  09/11/11  . BACK SURGERY    . BI-VENTRICULAR IMPLANTABLE CARDIOVERTER DEFIBRILLATOR N/A 08/22/2012   SJM Quadra Assura BiV ICD implanted by Dr Johney FrameAllred  . CARDIAC CATHETERIZATION  1998; 2013  . CARDIOVERSION N/A 07/26/2016   Procedure: CARDIOVERSION;  Surgeon: Lars MassonKatarina H Nelson, MD;  [facility];  Service: Cardiovascular;  Laterality: N/A;  . CAROTID ENDARTERECTOMY Left July  2005  . CORONARY ARTERY BYPASS GRAFT  1998   CABG X5  . CYSTOSCOPY W/ STONE MANIPULATION  X 2  . ELECTROPHYSIOLOGIC STUDY N/A 08/17/2016   AVN ablation by Dr Johney FrameAllred  . ESOPHAGOGASTRODUODENOSCOPY N/A 02/28/2016   Procedure: ESOPHAGOGASTRODUODENOSCOPY (EGD);  Surgeon: Meryl DareMalcolm T Stark, MD;  [facility];  Service: Endoscopy;  Laterality: N/A;  . LEFT ATRIAL APPENDAGE OCCLUSION N/A 06/01/2016   Watchman device implanted by Dr Johney FrameAllred  . POSTERIOR FUSION LUMBAR SPINE  Sep 27, 2014   L4-L5  . TEE WITHOUT CARDIOVERSION N/A 07/26/2016   Procedure: TRANSESOPHAGEAL ECHOCARDIOGRAM (TEE);  Surgeon: Lars MassonKatarina H Nelson, MD;  [facility];  Service: Cardiovascular;  Laterality: N/A;    Social History   Social History  . Marital status: Widowed    Spouse name: N/A  . Number of children: 3  . Years of education: 7810 TH   Occupational History  . RETIRED---truck driver Retired   Social History  Main Topics  . Smoking status: Current Every Day Smoker    Packs/day: 0.50    Years: 48.00    Types: Cigarettes    Last attempt to quit: 12/11/2004  . Smokeless tobacco: Never Used  . Alcohol use 0.0 oz/week     Comment: 06/01/2016 "haven't had a drink in years"  . Drug use: No  . Sexual activity: Yes   Other Topics Concern  . Not on file   Social History Narrative   Patient is married with 3 children.   Patient is right handed.   Patient has 10 th grade education.   Patient drinks tea occasionally.    Family History  Problem Relation Age of Onset  . Stroke Maternal Uncle   . Stroke Maternal Uncle   .  Heart attack Neg Hx   . Hypertension Neg Hx     ROS: no fevers or chills, productive cough, hemoptysis, dysphasia, odynophagia, melena, hematochezia, dysuria, hematuria, rash, seizure activity, orthopnea, PND, pedal edema, claudication. Remaining systems are negative.  Physical Exam: Well-developed well-nourished in no acute distress.  Skin is warm and dry.  HEENT is normal.  Neck is supple.  Chest is clear to auscultation with normal expansion.  Cardiovascular exam is regular rate and rhythm.  Abdominal exam nontender or distended. No masses palpated. Extremities show no edema. neuro grossly intact  A/P  1 Atrial fibrillation-patient is status post AV node ablation. He is also status post watchman device on June 22. Continue aspirin. Discontinue Plavix on December 22.  2 hyperlipidemia-continue statin.  3 ischemic cardiomyopathy continue ACE inhibitor and beta blocker.  4 coronary artery disease-continue aspirin and statin.  5 ICD-managed by electrophysiology.  6 chronic systolic congestive heart failure- Continue present dose of Lasix. Patient euvolemic on examination.   7 cerebrovascular disease/abdominal aortic aneurysm status post repair/iliac aneurysm-followed by vascular surgery.  Olga Millers, MD

## 2016-11-06 ENCOUNTER — Encounter: Payer: Self-pay | Admitting: Cardiology

## 2016-11-11 IMAGING — CR DG CHEST 2V
2 series · 2 of 2 positions shown · non-contrast
Comparison: 03/04/2015

CLINICAL DATA: Headache, dizziness, hypertensive

EXAM:
CHEST  2 VIEW

[chest pa]
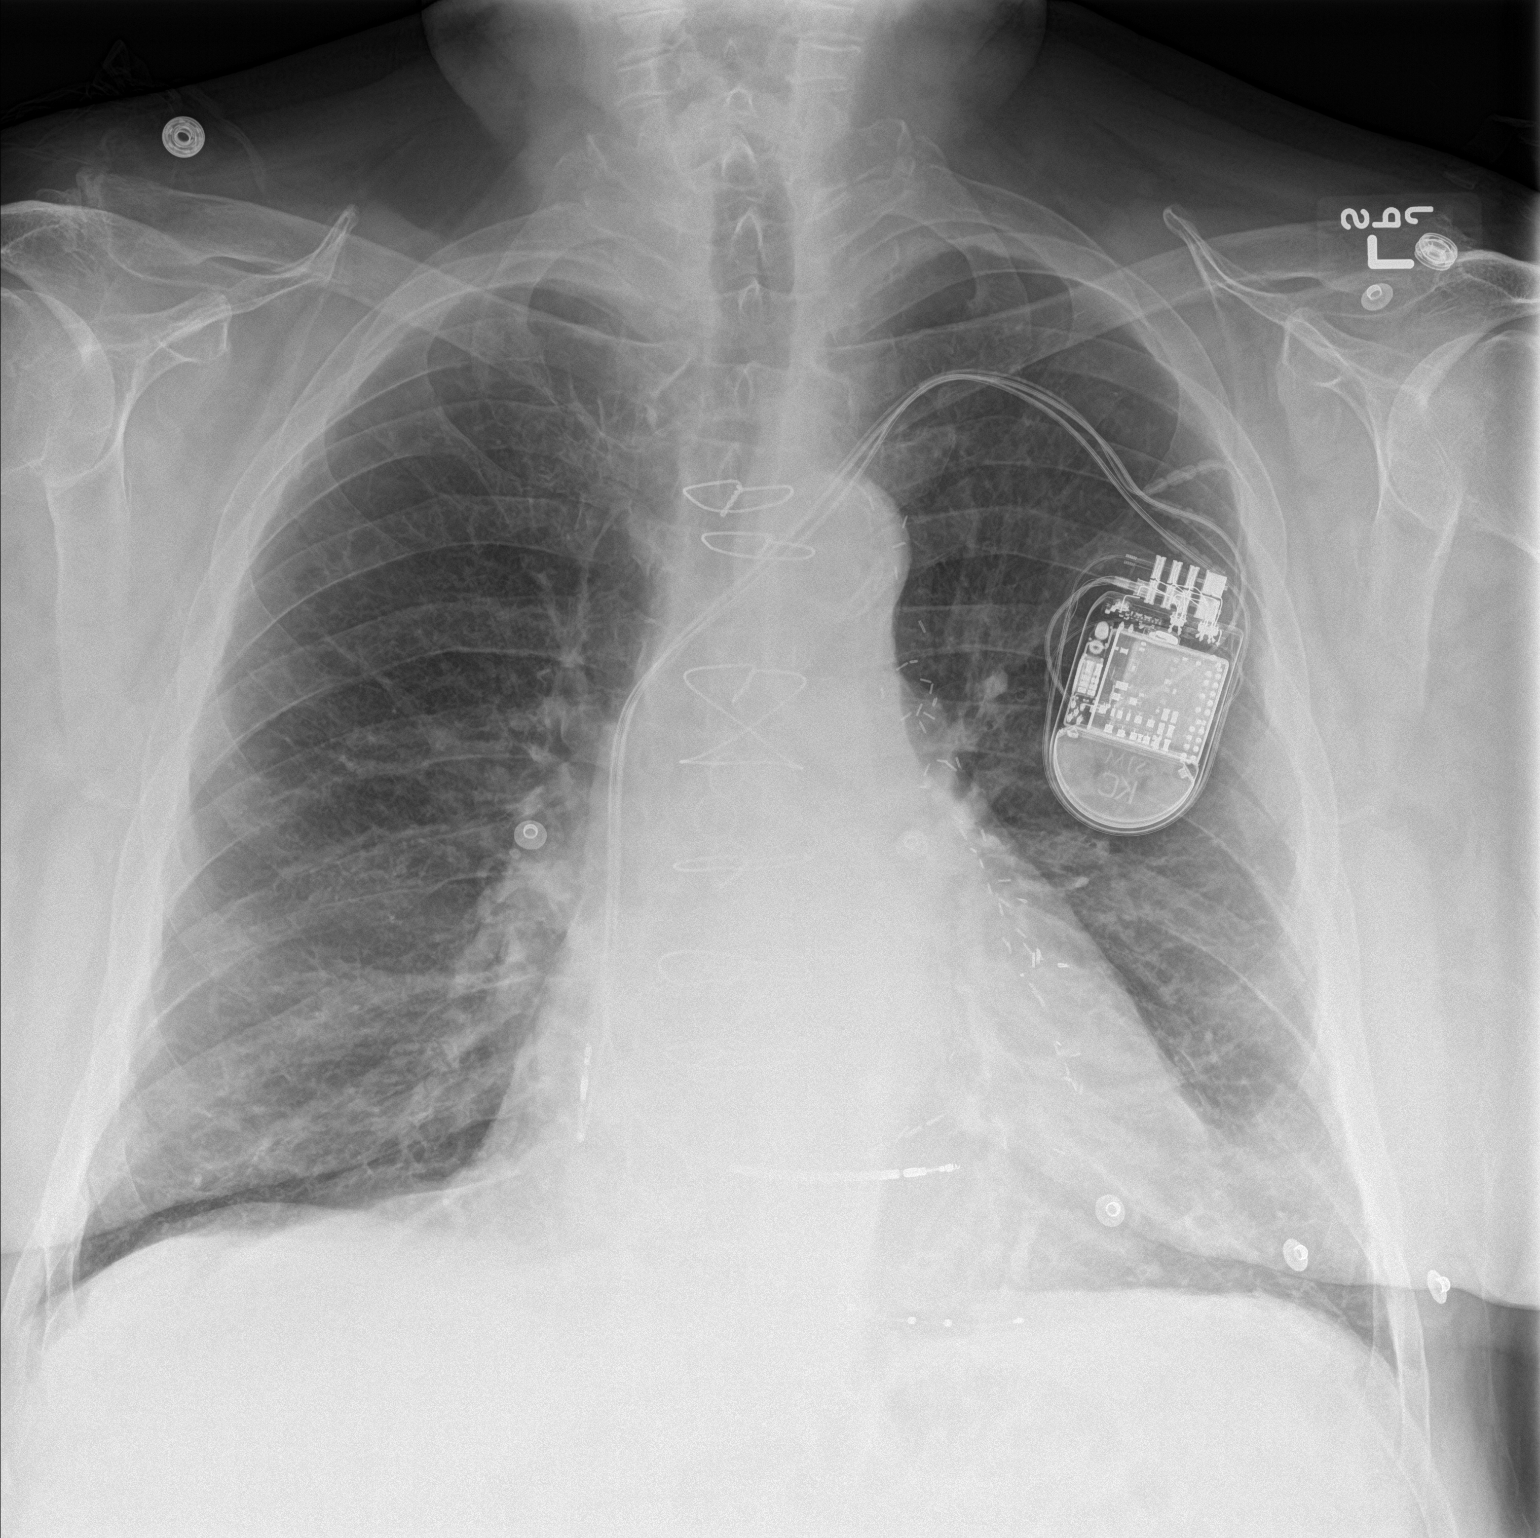

[chest lat]
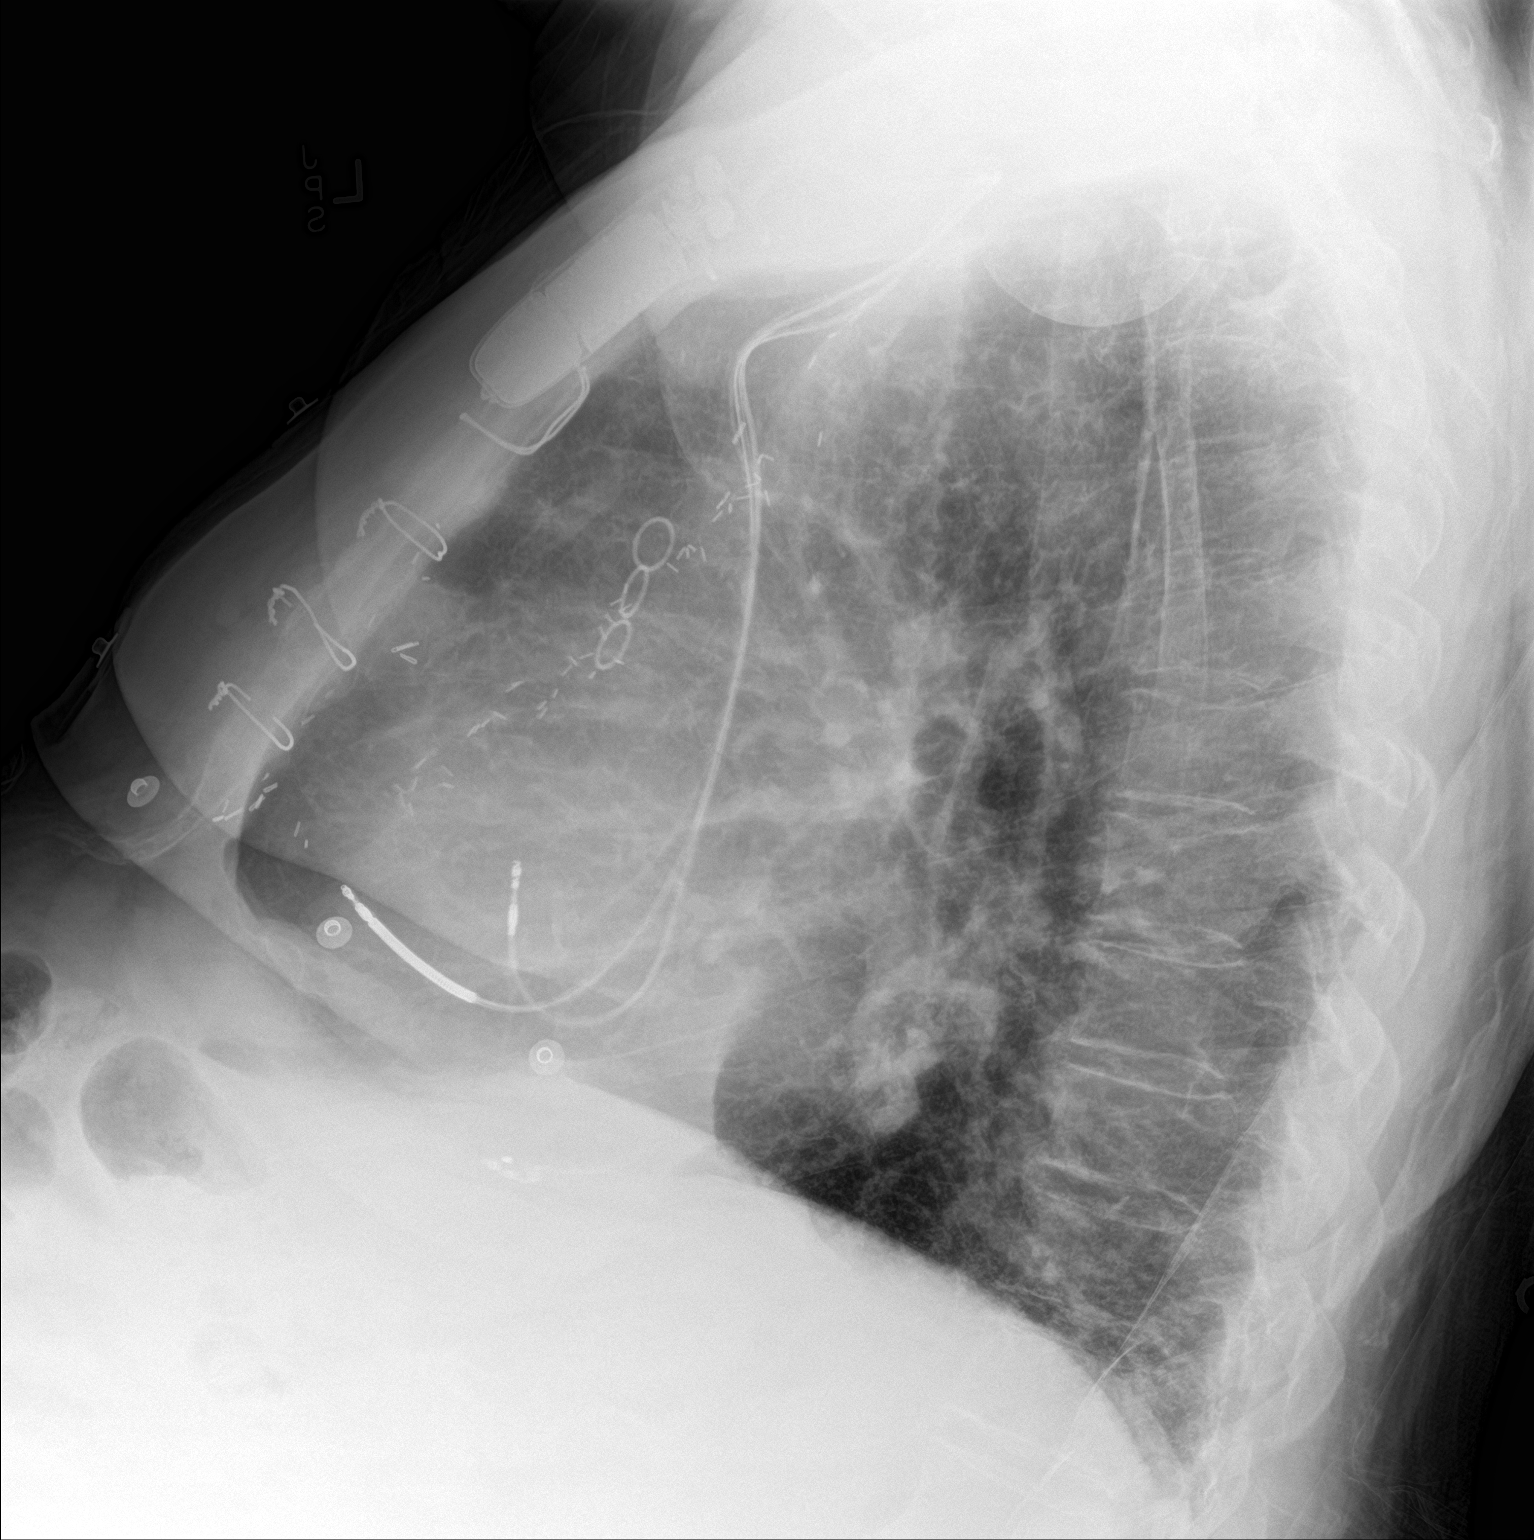

[2 of 2 positions shown; findings below may reference images not displayed]

FINDINGS: Evidence of CABG. Mild cardiomegaly noted with central vascular
congestion but no overt alveolar edema. A few interstitial Kerley
B-lines are noted at the lung bases. No focal opacity or
consolidation. No pleural effusion. Left AICD in place. Mild
flattening of the hemidiaphragms may suggest emphysema.
IMPRESSION: Mild cardiomegaly with interstitial pulmonary edema.

## 2016-11-13 ENCOUNTER — Ambulatory Visit (INDEPENDENT_AMBULATORY_CARE_PROVIDER_SITE_OTHER): Payer: Medicare Other | Admitting: Cardiology

## 2016-11-13 ENCOUNTER — Encounter: Payer: Self-pay | Admitting: Cardiology

## 2016-11-13 VITALS — BP 120/74 | HR 89 | Ht 73.0 in | Wt 173.0 lb

## 2016-11-13 DIAGNOSIS — I255 Ischemic cardiomyopathy: Secondary | ICD-10-CM

## 2016-11-13 DIAGNOSIS — I5022 Chronic systolic (congestive) heart failure: Secondary | ICD-10-CM | POA: Diagnosis not present

## 2016-11-13 DIAGNOSIS — I2510 Atherosclerotic heart disease of native coronary artery without angina pectoris: Secondary | ICD-10-CM | POA: Diagnosis not present

## 2016-11-13 DIAGNOSIS — I10 Essential (primary) hypertension: Secondary | ICD-10-CM | POA: Diagnosis not present

## 2016-11-13 DIAGNOSIS — Z95810 Presence of automatic (implantable) cardiac defibrillator: Secondary | ICD-10-CM | POA: Diagnosis not present

## 2016-11-13 NOTE — Patient Instructions (Signed)
Medication Instructions:   TAKE THE LAST DOSE OF PLAVIX 12-01-16  Follow-Up:  Your physician wants you to follow-up in: 6 MONTHS WITH DR [name] You will receive a reminder letter in the mail two months in advance. If you don't receive a letter, please call our office to schedule the follow-up appointment.   If you need a refill on your cardiac medications before your next appointment, please call your pharmacy.

## 2016-11-16 ENCOUNTER — Ambulatory Visit (INDEPENDENT_AMBULATORY_CARE_PROVIDER_SITE_OTHER): Payer: Medicare Other | Admitting: *Deleted

## 2016-11-16 DIAGNOSIS — I255 Ischemic cardiomyopathy: Secondary | ICD-10-CM | POA: Diagnosis not present

## 2016-11-16 DIAGNOSIS — Z95810 Presence of automatic (implantable) cardiac defibrillator: Secondary | ICD-10-CM

## 2016-11-16 DIAGNOSIS — I5022 Chronic systolic (congestive) heart failure: Secondary | ICD-10-CM | POA: Diagnosis not present

## 2016-11-16 NOTE — Progress Notes (Signed)
Remote ICD transmission.   

## 2016-11-17 NOTE — Progress Notes (Signed)
EPIC Encounter for ICM Monitoring  Patient Name: [name] [name] is a 75 y.o. male Date: 11/17/2016 Primary Care Physican: [name], MD Primary Cardiologist:Crenshaw Electrophysiologist: [name] Dry Weight:180.4lb  Bi-V Pacing: >99% AT/AF Burden >99%                                                       Heart Failure questions reviewed, pt asymptomatic   Thoracic impedance normal.  Recommendations: No changes.  Reinforced to limit low salt food choices to 2000 mg day and limiting fluid intake to < 2 liters per day. Encouraged to call for fluid symptoms.    Follow-up plan: ICM clinic phone appointment on 12/18/2016.  Copy of ICM check sent to device physician.   ICM trend: 11/16/2016       Karie Soda, RN 11/17/2016 9:08 AM

## 2016-11-22 ENCOUNTER — Encounter: Payer: Self-pay | Admitting: Cardiology

## 2016-12-13 LAB — CUP PACEART REMOTE DEVICE CHECK
Battery Remaining Longevity: 31 mo
Battery Remaining Percentage: 42 %
HIGH POWER IMPEDANCE MEASURED VALUE: 72 Ohm
HIGH POWER IMPEDANCE MEASURED VALUE: 72 Ohm
Implantable Lead Implant Date: 20130912
Implantable Lead Location: 753858
Lead Channel Impedance Value: 480 Ohm
Lead Channel Impedance Value: 490 Ohm
Lead Channel Sensing Intrinsic Amplitude: 1.8 mV
Lead Channel Setting Pacing Amplitude: 2 V
Lead Channel Setting Pacing Amplitude: 2.875
Lead Channel Setting Pacing Pulse Width: 0.5 ms
Lead Channel Setting Pacing Pulse Width: 0.8 ms
MDC IDC LEAD IMPLANT DT: 20130912
MDC IDC LEAD IMPLANT DT: 20130912
MDC IDC LEAD LOCATION: 753859
MDC IDC LEAD LOCATION: 753860
MDC IDC MSMT BATTERY VOLTAGE: 2.9 V
MDC IDC MSMT LEADCHNL LV IMPEDANCE VALUE: 960 Ohm
MDC IDC MSMT LEADCHNL LV PACING THRESHOLD AMPLITUDE: 1.875 V
MDC IDC MSMT LEADCHNL LV PACING THRESHOLD PULSEWIDTH: 0.8 ms
MDC IDC MSMT LEADCHNL RV PACING THRESHOLD AMPLITUDE: 0.625 V
MDC IDC MSMT LEADCHNL RV PACING THRESHOLD PULSEWIDTH: 0.5 ms
MDC IDC MSMT LEADCHNL RV SENSING INTR AMPL: 11.7 mV
MDC IDC PG IMPLANT DT: 20130912
MDC IDC PG SERIAL: 1075181
MDC IDC SESS DTM: 20171207090019
MDC IDC SET LEADCHNL RV SENSING SENSITIVITY: 0.5 mV

## 2016-12-18 ENCOUNTER — Ambulatory Visit (INDEPENDENT_AMBULATORY_CARE_PROVIDER_SITE_OTHER): Payer: Medicare Other

## 2016-12-18 ENCOUNTER — Telehealth: Payer: Self-pay | Admitting: Cardiology

## 2016-12-18 ENCOUNTER — Other Ambulatory Visit: Payer: Self-pay | Admitting: Cardiology

## 2016-12-18 DIAGNOSIS — I5022 Chronic systolic (congestive) heart failure: Secondary | ICD-10-CM | POA: Diagnosis not present

## 2016-12-18 DIAGNOSIS — Z95810 Presence of automatic (implantable) cardiac defibrillator: Secondary | ICD-10-CM

## 2016-12-18 MED ORDER — PRAVASTATIN SODIUM 80 MG PO TABS: 80.0000 mg | ORAL_TABLET | Freq: Every day | ORAL | 3 refills | Status: DC

## 2016-12-18 NOTE — Telephone Encounter (Signed)
Rx(s) sent to pharmacy electronically.  

## 2016-12-18 NOTE — Telephone Encounter (Signed)
°*  STAT* If patient is at the pharmacy, call can be transferred to refill team.   1. Which medications need to be refilled? (please list name of each medication and dose if known)Pravastatin ( Renewal )   2. Which pharmacy/location (including street and city if local pharmacy) is medication to be sent to?[facility]   3. Do they need a 30 day or 90 day supply?90

## 2016-12-19 NOTE — Progress Notes (Signed)
EPIC Encounter for ICM Monitoring  Patient Name: [name] [name] is a 76 y.o. male Date: 12/19/2016 Primary Care Physican: [name], MD Primary Cardiologist:Crenshaw Electrophysiologist: [name] Dry Weight:unknown  Bi-V Pacing: >97% AT/AF Burden >99%          Attempted ICM call and unable to reach.  Left detailed message regarding transmission.  Transmission reviewed.   Thoracic impedance trending to normal.  Was abnormal suggesting fluid accumulation 12/18 to 12/28.  Recommendations:  Provided ICM number and encouraged to call for fluid symptoms.    Follow-up plan: ICM clinic phone appointment on 01/19/2017.  Copy of ICM check sent to device physician.   3 month ICM trend : 12/18/2016   1 Year ICM trend:      Karie Soda, RN 12/19/2016 9:34 AM

## 2016-12-21 ENCOUNTER — Ambulatory Visit (INDEPENDENT_AMBULATORY_CARE_PROVIDER_SITE_OTHER): Payer: Medicare Other | Admitting: Emergency Medicine

## 2016-12-21 ENCOUNTER — Encounter: Payer: Self-pay | Admitting: Emergency Medicine

## 2016-12-21 DIAGNOSIS — Z23 Encounter for immunization: Secondary | ICD-10-CM

## 2016-12-21 DIAGNOSIS — J449 Chronic obstructive pulmonary disease, unspecified: Secondary | ICD-10-CM

## 2016-12-21 MED ORDER — ALBUTEROL SULFATE (2.5 MG/3ML) 0.083% IN NEBU: 2.5000 mg | INHALATION_SOLUTION | RESPIRATORY_TRACT | 5 refills | Status: DC | PRN

## 2016-12-21 NOTE — Assessment & Plan Note (Signed)
Based on his pulmonary function testing and his daily symptoms I believe he would probably benefit from a long-acting bronchodilator. He is not convinced that he wants to do that at this time. I did discuss the potential benefits with him. We will concentrate instead on smoking cessation. He is using cigarettes much less frequently than he was at our last visit. Prevnar 13 today, flu shot up-to-date.  We discussed possibly starting an every-day inhaler today. If your daily symptoms worsen or if your albuterol use increases then we will start one in the future.  Please continue to work on stopping smoking  Flu shot today.  Prevnar -13 today Follow with Dr Delton Coombes in 12 months or sooner if you have any problems

## 2016-12-21 NOTE — Patient Instructions (Addendum)
We discussed possibly starting an every-day inhaler today. If your daily symptoms worsen or if your albuterol use increases then we will start one in the future.  Please continue to work on stopping smoking  Flu shot today.  Prevnar -13 today Follow with Dr Delton Coombes in 12 months or sooner if you have any problems

## 2016-12-21 NOTE — Addendum Note (Signed)
Addended by: Jaynee Eagles C on: 12/21/2016 09:59 AM   Modules accepted: Orders

## 2016-12-21 NOTE — Progress Notes (Signed)
Subjective:    Patient ID: [name] [name], male    DOB: 10/18/1941, 76 y.o.   MRN: [id number] HPI 76 yo man, former tobacco (80+ pk-yrs), hx of CAD, HTN, A Fib, carotid dz. Was hospitalized 9-09/2011 for appendectomy, and during that hosp was found to have hypoxemia. Pulm consulted and he was suspected to have COPD + restriction due to abd compliance. He is not quite back up to usual functional capacity yet. He describes exertional SOB after walking 200 ft. No real cough. On coumadin for A Fib. He has had PFT at [facility] several yrs ago. Not on BD's at this time.   ROV 11/14/11 -- Hx Tobacco use, dyspnea, returns to eval SOB following his PFT today. Shows mild AFL, no BD response, normal volumes. He has been dieting, has lost about 20 lbs since hospitalization. His breathing is better - still with some limitations but able to do more.   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 Follows at [facility] cards for CAD, HTN, A Fib. Since last time he has had a pacer placed, followed by Dr [name]. He had some SOB that improved when the pacer was placed. He is not on scheduled BD's at this time. He sometimes has wheezing, no associated CP or tightness. He used SABA a few times but never really helped him. He has put back on about 15 lbs.   ROV 02/10/14 -- hx COPD, mild AFL. Also with CAD, HTN, A Fib s/p pacer. He returns today and reports that he was treated for an AE and CAP about 1 month ago. He had wheezing, coughing, non-productive. He was treated with corticosteroids and abx. He now feels better. He hears some wheeze. He uses SABA rarely, more often in the Summer. He is on DuoNebs bid that he started 3-4 weeks ago. He believes that it is helping him.   ROV 10/06/14 -- hx COPD, mild AFL. Also a hx A Fib, pacemaker. He has been doing very well.  Has not needed his SABA. He notes that his breathing improved significantly since his pacemaker. He hasn't been using any of his inhaled meds, feels quite  well.   ROV 10/29/15 -- follow-up visit for COPD with associated mild obstructive lung disease, atrial fibrillation, CAD, hypertension. He's also been followed for a AAA by Dr. [name] and will likely require repair.  He has been doing fairly well, has some wheeze occasionally and uses albuterol neb once every few weeks. He still smokes about 1 pack a month.  He is not on scheduled therapy. His last AE was last Summer '16, treated with abx and pred.    ROV 12/21/16 -- patient has a history of mild airflow obstruction and COPD, atrial fibrillation, coronary disease, hypertension. Continues to smoke but less - smokes a pack in a week. As recent spirometry 10/2015 was consistent with progressive obstruction.  He is not on any maintenance bronchodilators at this time. He has been in and out of the hospital since our last visit. He underwent a AAA repair, has been hospitalized for hematemesis, has undergone an AV nodal ablation now paced. He has albuterol nebs, uses occasionally, about 1-2x a week. He remains active. He does hear wheeze daily. No cough.     Objective:   Physical Exam Vitals:   12/21/16 0940 12/21/16 0941  BP:  (!) 128/92  BP Location:  Right Arm  Cuff Size:  Normal  Pulse:  85  SpO2:  95%  Weight: 183 lb (  83 kg)   Height: 6\' 1"  (1.854 m)    Gen: Pleasant, obese, in no distress,  normal affect  ENT: No lesions,  mouth clear,  oropharynx clear, no postnasal drip  Neck: No JVD, no TMG, no carotid bruits  Lungs: No use of accessory muscles, no wheeze  Cardiovascular: RRR, heart sounds normal, no murmur or gallops, no peripheral edema  Musculoskeletal: No deformities, no cyanosis or clubbing  Neuro: alert, non focal  Skin: Warm, no lesions or rashes     Assessment & Plan:  COPD (chronic obstructive pulmonary disease) (HCC) Based on his pulmonary function testing and his daily symptoms I believe he would probably benefit from a long-acting bronchodilator. He is not convinced  that he wants to do that at this time. I did discuss the potential benefits with him. We will concentrate instead on smoking cessation. He is using cigarettes much less frequently than he was at our last visit. Prevnar 13 today, flu shot up-to-date.  We discussed possibly starting an every-day inhaler today. If your daily symptoms worsen or if your albuterol use increases then we will start one in the future.  Please continue to work on stopping smoking  Flu shot today.  Prevnar -13 today Follow with Dr Delton Coombes in 12 months or sooner if you have any problems   Levy Pupa, MD, PhD 12/21/2016, 9:56 AM [facility] [phone] or if no answer [phone]

## 2016-12-22 NOTE — Progress Notes (Signed)
Returned call and he stated is feeling good. He denied any fluid symptoms.  No changes today.  Encouraged to call for any fluid symptoms.  Next ICM clinic phone appointment on 01/19/2017.

## 2017-01-19 ENCOUNTER — Ambulatory Visit (INDEPENDENT_AMBULATORY_CARE_PROVIDER_SITE_OTHER): Payer: Medicare Other

## 2017-01-19 DIAGNOSIS — I5022 Chronic systolic (congestive) heart failure: Secondary | ICD-10-CM

## 2017-01-19 DIAGNOSIS — Z95810 Presence of automatic (implantable) cardiac defibrillator: Secondary | ICD-10-CM | POA: Diagnosis not present

## 2017-01-19 NOTE — Progress Notes (Signed)
EPIC Encounter for ICM Monitoring  Patient Name: [name] [name] is a 76 y.o. male Date: 01/19/2017 Primary Care Physican: [name], MD Primary Cardiologist:Crenshaw Electrophysiologist: [name] Dry Weight:unknown  Bi-V Pacing: >95% AT/AF Burden >99%                                                                   Heart Failure questions reviewed, pt asymptomatic   Thoracic impedance normal but was abnormal suggesting fluid accumulation from 1/18 to 1/30.  Recommendations: No changes. Reminded to limit dietary salt intake to 2000 mg/day and fluid intake to < 2 liters/day. Encouraged to call for fluid symptoms.  Follow-up plan: ICM clinic phone appointment on 03/06/2017.  Copy of ICM check sent to primary cardiologist and device physician.   3 month ICM trend: 01/19/2017     1 Year ICM trend:      Karie Soda, RN 01/19/2017 7:43 AM

## 2017-01-23 ENCOUNTER — Telehealth: Payer: Self-pay

## 2017-01-23 NOTE — Telephone Encounter (Signed)
Returned patient call.  He asked for date of the office appt with Gypsy Balsam NP.  Information given and he confirmed he will be there.

## 2017-01-30 NOTE — Progress Notes (Signed)
Electrophysiology Office Note Date: 02/01/2017  ID:  [name] [name], DOB 10/13/1941, MRN [id number]  PCP: [name], MD Primary Cardiologist: [name] Electrophysiologist: [name]  CC: routine ICD and Watchman follow up  [name] [name] is a 76 y.o. male seen today post AVN ablation.  Since discharge, the patient reports doing very well. He continues to see symptomatic improvement following AVN ablation. He occasionally gets "tight" and takes extra Lasix as needed.   He denies chest pain, palpitations,  PND, orthopnea, nausea, vomiting, dizziness, syncope, edema, or early satiety.  He has not had ICD shocks. He continues to smoke.   Device History: STJ CRTD implanted 2013 for ICM, chronic systolic heart failure History of appropriate therapy: No History of AAD therapy: Yes - amiodarone for AF    Past Medical History:  Diagnosis Date  . Aortic dissection (HCC)    H/O focal aortic dissection  . CAD (coronary artery disease)    a. s/p CABG;  cath 10/07:  LM ok, LAD occluded, AV CFX 95%, pRCA occluded; L-LAD, S-OM2/OM3, S-PDA ok  . Cerebrovascular disease    a. s/p prior Left CEA;  followed by Dr. [name]  . CHF (congestive heart failure) (HCC)    a. s/p STJ CRTD  . CKD (chronic kidney disease)   . COPD (chronic obstructive pulmonary disease) (HCC)   . Depression   . DVT (deep venous thrombosis) (HCC)   . History of stomach ulcers   . Hyperlipidemia   . Hypertension   . Iliac artery aneurysm, left (HCC)    followed by Dr. [name]  . Ischemic cardiomyopathy    a. EF 25% by last myoview in 11/2015. s/p CRT-D  . LBBB (left bundle branch block)   . Myocardial infarction 1997  . Nephrolithiasis   . PAF (paroxysmal atrial fibrillation) (HCC)    a. on amio and pradaxa. b. s/p AVN ablation  . Stroke [facility]) 12/2015 "several mini"   a. on coumadin --> switched to pradaxa b. s/p Watchman   Past Surgical History:  Procedure Laterality Date  . ABDOMINAL AORTIC ANEURYSM REPAIR N/A  01/03/2016   Procedure: ANEURYSM ABDOMINAL AORTA, BILATERAL COMMON ILIAC REPAIR;  Surgeon: [name], MD;  Location: [facility] OR;  Service: Vascular;  Laterality: N/A;  . APPENDECTOMY  09/11/11  . BACK SURGERY    . BI-VENTRICULAR IMPLANTABLE CARDIOVERTER DEFIBRILLATOR N/A 08/22/2012   SJM Quadra Assura BiV ICD implanted by Dr [name]  . CARDIAC CATHETERIZATION  1998; 2013  . CARDIOVERSION N/A 07/26/2016   Procedure: CARDIOVERSION;  Surgeon: Lars Masson, MD;  Location: [facility] ENDOSCOPY;  Service: Cardiovascular;  Laterality: N/A;  . CAROTID ENDARTERECTOMY Left July  2005  . CORONARY ARTERY BYPASS GRAFT  1998   CABG X5  . CYSTOSCOPY W/ STONE MANIPULATION  X 2  . ELECTROPHYSIOLOGIC STUDY N/A 08/17/2016   AVN ablation by Dr [name]  . ESOPHAGOGASTRODUODENOSCOPY N/A 02/28/2016   Procedure: ESOPHAGOGASTRODUODENOSCOPY (EGD);  Surgeon: [name], MD;  Location: [facility] ENDOSCOPY;  Service: Endoscopy;  Laterality: N/A;  . LEFT ATRIAL APPENDAGE OCCLUSION N/A 06/01/2016   Watchman device implanted by Dr [name]  . POSTERIOR FUSION LUMBAR SPINE  Sep 27, 2014   L4-L5  . TEE WITHOUT CARDIOVERSION N/A 07/26/2016   Procedure: TRANSESOPHAGEAL ECHOCARDIOGRAM (TEE);  Surgeon: Lars Masson, MD;  Location: [facility] ENDOSCOPY;  Service: Cardiovascular;  Laterality: N/A;    Current Outpatient Prescriptions  Medication Sig Dispense Refill  . albuterol (PROVENTIL) (2.5 MG/3ML) 0.083% nebulizer solution Take 3 mLs (2.5 mg  total) by nebulization every 4 (four) hours as needed for wheezing or shortness of breath. 150 mL 5  . amLODipine (NORVASC) 10 MG tablet Take 10 mg by mouth daily.    [name] aspirin EC 325 MG EC tablet Take 1 tablet (325 mg total) by mouth daily.    . citalopram (CELEXA) 10 MG tablet Take 10 mg by mouth daily. Reported on 06/06/2016    . furosemide (LASIX) 20 MG tablet Take 3 tablets (60 mg total) by mouth daily. 90 tablet 11  . ibuprofen (ADVIL,MOTRIN) 200 MG tablet Take 400 mg by mouth every 6 (six) hours as  needed (For pain.).    [name] lisinopril (PRINIVIL,ZESTRIL) 10 MG tablet Take 10 mg by mouth daily.     . metoprolol succinate (TOPROL XL) 25 MG 24 hr tablet Take 1 tablet (25 mg total) by mouth daily. 90 tablet 3  . Multiple Vitamin (MULITIVITAMIN WITH MINERALS) TABS Take 1 tablet by mouth daily.    . niacin (NIASPAN) 500 MG CR tablet Take 1,000 mg by mouth 2 (two) times daily.     . nitroGLYCERIN (NITROSTAT) 0.4 MG SL tablet Place 1 tablet (0.4 mg total) under the tongue every 5 (five) minutes as needed for chest pain. 100 tablet 3  . potassium chloride SA (K-DUR,KLOR-CON) 20 MEQ tablet TAKE 2 TABLETS THREE TIMES DAILY. PLEASE SCHEDULE APPOINTMENT FOR REFILLS. 540 tablet 1  . pravastatin (PRAVACHOL) 80 MG tablet Take 1 tablet (80 mg total) by mouth daily. 90 tablet 3   No current facility-administered medications for this visit.     Allergies:   Buprenorphine hcl and Morphine and related   Social History: Social History   Social History  . Marital status: Widowed    Spouse name: N/A  . Number of children: 3  . Years of education: 58 TH   Occupational History  . RETIRED---truck driver Retired   Social History Main Topics  . Smoking status: Current Some Day Smoker    Packs/day: 0.50    Years: 48.00    Types: Cigarettes    Last attempt to quit: 12/11/2004  . Smokeless tobacco: Never Used  . Alcohol use 0.0 oz/week     Comment: 06/01/2016 "haven't had a drink in years"  . Drug use: No  . Sexual activity: Yes   Other Topics Concern  . Not on file   Social History Narrative   Patient is married with 3 children.   Patient is right handed.   Patient has 10 th grade education.   Patient drinks tea occasionally.    Family History: Family History  Problem Relation Age of Onset  . Stroke Maternal Uncle   . Stroke Maternal Uncle   . Heart attack Neg Hx   . Hypertension Neg Hx     Review of Systems: All other systems reviewed and are otherwise negative except as noted  above.   Physical Exam: VS:  BP 140/72   Pulse 70   Ht 6\' 1"  (1.854 m)   Wt 173 lb (78.5 kg)   BMI 22.82 kg/m  , BMI Body mass index is 22.82 kg/m.  GEN- The patient is elderly appearing, alert and oriented x 3 today.   HEENT: normocephalic, atraumatic; sclera clear, conjunctiva pink; hearing intact; oropharynx clear; neck supple  Lungs- Clear to ausculation bilaterally, normal work of breathing.  No wheezes, rales, rhonchi Heart- Regular rate and rhythm (paced) GI- soft, non-tender, non-distended, bowel sounds present  Extremities- no clubbing, cyanosis, or edema MS- no significant  deformity or atrophy Skin- warm and dry, no rash or lesion; ICD pocket well healed Psych- euthymic mood, full affect Neuro- strength and sensation are intact  ICD interrogation- reviewed in detail today,  See PACEART report  EKG:  EKG is not ordered today.  Recent Labs: 03/15/2016: Magnesium 1.7 03/28/2016: Brain Natriuretic Peptide 1,794.7 05/10/2016: ALT 9; TSH 0.84 08/15/2016: BUN 21; Creat 1.23; Hemoglobin 11.9; Platelets 137; Potassium 3.6; Sodium 146   Wt Readings from Last 3 Encounters:  02/01/17 173 lb (78.5 kg)  12/21/16 183 lb (83 kg)  11/13/16 173 lb (78.5 kg)     Other studies Reviewed: Additional studies/ records that were reviewed today include: Dr Jenel Lucks office notes, hospital records  Assessment and Plan:  1.  Persistent atrial fibrillation Doing well s/p Watchman  6 week TEE with well seated device and no significant jet Continue ASA daily   2. Chronic systolic dysfunction Euvolemic on exam Normal ICD function - CRT pacing >99% with AVN ablation - pt is device dependent today See Pace Art report No changes today SJM Fortify Assura advisory previously discussed with the patient. He understands that recommendation from SJM is to not replace the device at this time. The patient is device dependant.  The patient has  had appropriate device therapy in the past or implanted  for secondary prevention.  He is actively remotely monitored and understands the importance of compliance today.   3.  HTN Stable No change required today  4.  Tobacco abuse Cessation advised  Current medicines are reviewed at length with the patient today.   The patient does not have concerns regarding his medicines.  The following changes were made today:  none  Labs/ tests ordered today include: Orders Placed This Encounter  Procedures  . Basic metabolic panel  . CUP PACEART INCLINIC DEVICE CHECK    Disposition:   Follow up with ICM clinic, [name], Dr [name] as scheduled, Dr [name] 6 months   Signed, Gypsy Balsam, NP  02/01/2017 8:24 AM  [facility] [address] [place] [address] [phone] (office) [phone] (fax)

## 2017-02-01 ENCOUNTER — Ambulatory Visit (INDEPENDENT_AMBULATORY_CARE_PROVIDER_SITE_OTHER): Payer: Medicare Other | Admitting: Nurse Practitioner

## 2017-02-01 VITALS — BP 140/72 | HR 70 | Ht 73.0 in | Wt 173.0 lb

## 2017-02-01 DIAGNOSIS — I5022 Chronic systolic (congestive) heart failure: Secondary | ICD-10-CM

## 2017-02-01 DIAGNOSIS — I481 Persistent atrial fibrillation: Secondary | ICD-10-CM | POA: Diagnosis not present

## 2017-02-01 DIAGNOSIS — I10 Essential (primary) hypertension: Secondary | ICD-10-CM | POA: Diagnosis not present

## 2017-02-01 DIAGNOSIS — I4819 Other persistent atrial fibrillation: Secondary | ICD-10-CM

## 2017-02-01 LAB — CUP PACEART INCLINIC DEVICE CHECK
Date Time Interrogation Session: 20180222081009
Implantable Lead Implant Date: 20130912
Implantable Lead Location: 753859
Implantable Pulse Generator Implant Date: 20130912
MDC IDC LEAD IMPLANT DT: 20130912
MDC IDC LEAD IMPLANT DT: 20130912
MDC IDC LEAD LOCATION: 753858
MDC IDC LEAD LOCATION: 753860
Pulse Gen Serial Number: 1075181

## 2017-02-01 LAB — BASIC METABOLIC PANEL
BUN / CREAT RATIO: 16 (ref 10–24)
BUN: 23 mg/dL (ref 8–27)
CALCIUM: 8.9 mg/dL (ref 8.6–10.2)
CO2: 28 mmol/L (ref 18–29)
CREATININE: 1.44 mg/dL — AB (ref 0.76–1.27)
Chloride: 100 mmol/L (ref 96–106)
GFR calc non Af Amer: 47 — ABNORMAL LOW (ref >59)
GFR, EST AFRICAN AMERICAN: 55 — AB (ref >59)
GLUCOSE: 90 mg/dL (ref 65–99)
Potassium: 3.2 mmol/L — ABNORMAL LOW (ref 3.5–5.2)
Sodium: 146 mmol/L — ABNORMAL HIGH (ref 134–144)

## 2017-02-01 NOTE — Patient Instructions (Signed)
Medication Instructions:   Your physician recommends that you continue on your current medications as directed. Please refer to the Current Medication list given to you today.    If you need a refill on your cardiac medications before your next appointment, please call your pharmacy.  Labwork: BMET TODAY    Testing/Procedures: NONE ORDERED  TODAY   Follow-Up: WITH DR [name] AS SCHEDULED   Your physician wants you to follow-up in:  IN  6  MONTHS WITH DR [name]   You will receive a reminder letter in the mail two months in advance. If you don't receive a letter, please call our office to schedule the follow-up appointment.      Any Other Special Instructions Will Be Listed Below (If Applicable).

## 2017-02-13 ENCOUNTER — Encounter (HOSPITAL_COMMUNITY): Payer: Medicare Other

## 2017-02-13 ENCOUNTER — Ambulatory Visit: Payer: Medicare Other | Admitting: Vascular Surgery

## 2017-03-06 ENCOUNTER — Ambulatory Visit (INDEPENDENT_AMBULATORY_CARE_PROVIDER_SITE_OTHER): Payer: Medicare Other

## 2017-03-06 ENCOUNTER — Telehealth: Payer: Self-pay

## 2017-03-06 DIAGNOSIS — I5022 Chronic systolic (congestive) heart failure: Secondary | ICD-10-CM | POA: Diagnosis not present

## 2017-03-06 DIAGNOSIS — Z95810 Presence of automatic (implantable) cardiac defibrillator: Secondary | ICD-10-CM | POA: Diagnosis not present

## 2017-03-06 NOTE — Telephone Encounter (Signed)
Remote ICM transmission received.  Attempted patient call and left message to return call.   

## 2017-03-06 NOTE — Progress Notes (Signed)
Patient returned call.  He stated he feels fine but had missed a couple of days of Furosemide and is back on track taking as prescribed.  [place] and encouraged to call if he has any fluid symptoms.  No changes today.  Next ICM remote transmission 04/06/2017.

## 2017-03-06 NOTE — Progress Notes (Signed)
EPIC Encounter for ICM Monitoring  Patient Name: [name] [name] is a 76 y.o. male Date: 03/06/2017 Primary Care Physican: [name], MD Primary Cardiologist:Crenshaw Electrophysiologist: [name] Dry Weight:unknown Bi-V Pacing: >95% AT/AF Burden >99%      Attempted call to patient and unable to reach.  Left message to return call.  Transmission reviewed.    Thoracic impedance slightly abnormal suggesting fluid accumulation since 02/28/2017 but close to baseline.  Prescribed dosage: Furosemide 20 mg 3 tablets (60 mg total) daily.  Potassium 20 mEq 2 tablets 3 times a day.  Per result note 2/226/2018, Gypsy Balsam, NP recommended he limit prn lasix to 3 days/week.  Labs: 02/01/2017 Creatinine 1.44, BUN 23, Potassium 3.2. Sodium 146 08/15/2016 Creatinine 1.23, BUN 21, Potassium 3.6, Sodium 146  08/09/2016 Creatinine 1.36, BUN 21, Potassium 2.7, Sodium 143  07/10/2016 Creatinine 1.72, BUN 33, Potassium 5.1, Sodium 143  06/02/2016 Creatinine 1.44, BUN 20, Potassium 3.0, Sodium 144   Recommendations: NONE - Unable to reach patient   Follow-up plan: ICM clinic phone appointment on 04/06/2017.  Copy of ICM check sent to device physician.   3 month ICM trend: 03/06/2017   1 Year ICM trend:      Karie Soda, RN 03/06/2017 12:07 PM

## 2017-03-16 ENCOUNTER — Telehealth: Payer: Self-pay | Admitting: Cardiology

## 2017-03-16 NOTE — Telephone Encounter (Signed)
LMOVM requesting that pt send manual transmission b/c home monitor has not updated in at least 7 days.    

## 2017-03-19 ENCOUNTER — Telehealth: Payer: Self-pay | Admitting: Internal Medicine

## 2017-03-19 NOTE — Telephone Encounter (Signed)
New Message     Pt is returning [place] call

## 2017-03-20 NOTE — Telephone Encounter (Signed)
Spoke w/ pt and informed him that the reason I called was b/c his home monitor had not updated, but his monitor is currently updated and he can disregard the call. Pt verbalized understanding.

## 2017-04-03 ENCOUNTER — Encounter: Payer: Self-pay | Admitting: Vascular Surgery

## 2017-04-06 ENCOUNTER — Telehealth: Payer: Self-pay | Admitting: Cardiology

## 2017-04-06 NOTE — Telephone Encounter (Signed)
LMOVM reminding pt to send remote transmission.   

## 2017-04-10 ENCOUNTER — Encounter: Payer: Self-pay | Admitting: Vascular Surgery

## 2017-04-10 ENCOUNTER — Ambulatory Visit (INDEPENDENT_AMBULATORY_CARE_PROVIDER_SITE_OTHER): Payer: Medicare Other | Admitting: Vascular Surgery

## 2017-04-10 ENCOUNTER — Ambulatory Visit (HOSPITAL_COMMUNITY)
Admission: RE | Admit: 2017-04-10 | Discharge: 2017-04-10 | Disposition: A | Discharge status: Home / Self Care | Payer: Medicare Other | Source: Ambulatory Visit | Attending: Vascular Surgery | Admitting: Vascular Surgery

## 2017-04-10 VITALS — BP 147/82 | HR 77 | Temp 97.0°F | Resp 20 | Ht 73.0 in | Wt 180.0 lb

## 2017-04-10 DIAGNOSIS — Z8679 Personal history of other diseases of the circulatory system: Secondary | ICD-10-CM | POA: Insufficient documentation

## 2017-04-10 DIAGNOSIS — I714 Abdominal aortic aneurysm, without rupture, unspecified: Secondary | ICD-10-CM

## 2017-04-10 DIAGNOSIS — Z9889 Other specified postprocedural states: Secondary | ICD-10-CM | POA: Diagnosis not present

## 2017-04-10 NOTE — Progress Notes (Signed)
Vascular and Vein Specialist of [facility]  Patient name: [name] [name] MRN: [id number] DOB: 1941/06/16 Sex: male  REASON FOR VISIT: Follow-up open aneurysm repair January 2017  HPI: [name] [name] is a 76 y.o. male here today for follow-up. He no new medical problems. Has no stroke symptoms and no cardiac symptoms. He does complain of occasional soreness of the xiphoid process. Is status post median sternotomy with coronary bypass grafting 20 years ago. No symptoms of abdominal hernia or other abdominal pain. No claudication type pain  Past Medical History:  Diagnosis Date  . Aortic dissection (HCC)    H/O focal aortic dissection  . CAD (coronary artery disease)    a. s/p CABG;  cath 10/07:  LM ok, LAD occluded, AV CFX 95%, pRCA occluded; L-LAD, S-OM2/OM3, S-PDA ok  . Cerebrovascular disease    a. s/p prior Left CEA;  followed by Dr. [name]  . CHF (congestive heart failure) (HCC)    a. s/p STJ CRTD  . CKD (chronic kidney disease)   . COPD (chronic obstructive pulmonary disease) (HCC)   . Depression   . DVT (deep venous thrombosis) (HCC)   . History of stomach ulcers   . Hyperlipidemia   . Hypertension   . Iliac artery aneurysm, left (HCC)    followed by Dr. [name]  . Ischemic cardiomyopathy    a. EF 25% by last myoview in 11/2015. s/p CRT-D  . LBBB (left bundle branch block)   . Myocardial infarction (HCC) 1997  . Nephrolithiasis   . PAF (paroxysmal atrial fibrillation) (HCC)    a. on amio and pradaxa. b. s/p AVN ablation  . Stroke [facility]) 12/2015 "several mini"   a. on coumadin --> switched to pradaxa b. s/p Watchman    Family History  Problem Relation Age of Onset  . Stroke Maternal Uncle   . Stroke Maternal Uncle   . Heart attack Neg Hx   . Hypertension Neg Hx     SOCIAL HISTORY: Social History  Substance Use Topics  . Smoking status: Current Some Day Smoker    Packs/day: 0.50    Years: 48.00    Types: Cigarettes   Last attempt to quit: 12/11/2004  . Smokeless tobacco: Never Used     Comment: stated "I'm trying to quit  . Alcohol use 0.0 oz/week     Comment: 06/01/2016 "haven't had a drink in years"    Allergies  Allergen Reactions  . Buprenorphine Hcl Other (See Comments)    Patient stated that he did not do well on this medication. He felt like climbing the walls  . Morphine And Related Other (See Comments)    Patient stated that he did not do well on this medication. He felt like climbing the walls    Current Outpatient Prescriptions  Medication Sig Dispense Refill  . albuterol (PROVENTIL) (2.5 MG/3ML) 0.083% nebulizer solution Take 3 mLs (2.5 mg total) by nebulization every 4 (four) hours as needed for wheezing or shortness of breath. 150 mL 5  . amLODipine (NORVASC) 10 MG tablet Take 10 mg by mouth daily.    [name] aspirin EC 325 MG EC tablet Take 1 tablet (325 mg total) by mouth daily.    . citalopram (CELEXA) 10 MG tablet Take 10 mg by mouth daily. Reported on 06/06/2016    . furosemide (LASIX) 20 MG tablet Take 3 tablets (60 mg total) by mouth daily. 90 tablet 11  . ibuprofen (ADVIL,MOTRIN) 200 MG tablet Take 400 mg by mouth  every 6 (six) hours as needed (For pain.).    [name] lisinopril (PRINIVIL,ZESTRIL) 10 MG tablet Take 10 mg by mouth daily.     . metoprolol succinate (TOPROL XL) 25 MG 24 hr tablet Take 1 tablet (25 mg total) by mouth daily. 90 tablet 3  . Multiple Vitamin (MULITIVITAMIN WITH MINERALS) TABS Take 1 tablet by mouth daily.    . niacin (NIASPAN) 500 MG CR tablet Take 1,000 mg by mouth 2 (two) times daily.     . nitroGLYCERIN (NITROSTAT) 0.4 MG SL tablet Place 1 tablet (0.4 mg total) under the tongue every 5 (five) minutes as needed for chest pain. 100 tablet 3  . potassium chloride SA (K-DUR,KLOR-CON) 20 MEQ tablet TAKE 2 TABLETS THREE TIMES DAILY. PLEASE SCHEDULE APPOINTMENT FOR REFILLS. 540 tablet 1  . pravastatin (PRAVACHOL) 80 MG tablet Take 1 tablet (80 mg total) by mouth daily. 90  tablet 3   No current facility-administered medications for this visit.     REVIEW OF SYSTEMS:  [X]  denotes positive finding, [ ]  denotes negative finding Cardiac  Comments:  Chest pain or chest pressure:    Shortness of breath upon exertion:    Short of breath when lying flat:    Irregular heart rhythm:        Vascular    Pain in calf, thigh, or hip brought on by ambulation:    Pain in feet at night that wakes you up from your sleep:     Blood clot in your veins:    Leg swelling:           PHYSICAL EXAM: Vitals:   04/10/17 1146 04/10/17 1155  BP: 138/80 (!) 147/82  Pulse: 77   Resp: 20   Temp: 97 F (36.1 C)   TempSrc: Oral   SpO2: 97%   Weight: 180 lb (81.6 kg)   Height: 6\' 1"  (1.854 m)     GENERAL: The patient is a well-nourished male, in no acute distress. The vital signs are documented above. CARDIOVASCULAR: 2+ radial 2+ femoral and 2+ dorsalis pedis pulses bilaterally. Abdomen soft and nontender. No aneurysm palpable. Midline incision well healed with no evidence of hernia PULMONARY: There is good air exchange  MUSCULOSKELETAL: There are no major deformities or cyanosis. NEUROLOGIC: No focal weakness or paresthesias are detected. SKIN: There are no ulcers or rashes noted. PSYCHIATRIC: The patient has a normal affect.  DATA:  Ankle arm index today normal bilaterally with normal triphasic waveforms  MEDICAL ISSUES: Stable status post open aneurysm repair January 2017. He will resume full activities with no limitations. He will see [place] again on an as-needed basis.    [name] Earthly, MD FACS [facility] Special[facility] of [facility] Tel [phone] Pager [phone]

## 2017-04-10 NOTE — Progress Notes (Signed)
Vitals:   04/10/17 1146 04/10/17 1155  BP: 138/80 (!) 147/82  Pulse: 77   Resp: 20   Temp: 97 F (36.1 C)   TempSrc: Oral   SpO2: 97%   Weight: 180 lb (81.6 kg)   Height: 6\' 1"  (1.854 m)

## 2017-04-13 ENCOUNTER — Other Ambulatory Visit: Payer: Self-pay | Admitting: Emergency Medicine

## 2017-04-13 NOTE — Progress Notes (Signed)
No ICM remote transmission received for 04/06/2017 and next ICM transmission scheduled for 04/24/2017.

## 2017-04-24 ENCOUNTER — Telehealth: Payer: Self-pay

## 2017-04-24 ENCOUNTER — Telehealth: Payer: Self-pay | Admitting: Cardiology

## 2017-04-24 NOTE — Telephone Encounter (Signed)
Patient called and stated [place] is in the mountains and unable to send ICM remote transmission.  He will be back next week and transmission rescheduled for 05/03/2017.

## 2017-04-24 NOTE — Telephone Encounter (Signed)
Confirmed remote transmission w/ pt friend.   

## 2017-05-03 ENCOUNTER — Ambulatory Visit (INDEPENDENT_AMBULATORY_CARE_PROVIDER_SITE_OTHER): Payer: Medicare Other

## 2017-05-03 DIAGNOSIS — I5022 Chronic systolic (congestive) heart failure: Secondary | ICD-10-CM

## 2017-05-03 DIAGNOSIS — Z95810 Presence of automatic (implantable) cardiac defibrillator: Secondary | ICD-10-CM | POA: Diagnosis not present

## 2017-05-03 NOTE — Progress Notes (Signed)
EPIC Encounter for ICM Monitoring  Patient Name: [name] [name] is a 76 y.o. male Date: 05/03/2017 Primary Care Physican: [name], MD Primary Cardiologist:Crenshaw Electrophysiologist: [name] Dry Weight:unknown Bi-V Pacing: >97% AT/AF Burden >99%         Heart Failure questions reviewed, pt asymptomatic.   Thoracic impedance normal   Prescribed dosage: Furosemide 20 mg 3 tablets (60 mg total) daily.  Potassium 20 mEq 2 tablets 3 times a day.  Per result note 02/01/2017, Gypsy Balsam, NP recommended he limit prn lasix to 3 days/week.  Labs: 02/01/2017 Creatinine 1.44, BUN 23, Potassium 3.2. Sodium 146 08/15/2016 Creatinine 1.23, BUN 21, Potassium 3.6, Sodium 146  08/09/2016 Creatinine 1.36, BUN 21, Potassium 2.7, Sodium 143  07/10/2016 Creatinine 1.72, BUN 33, Potassium 5.1, Sodium 143  06/02/2016 Creatinine 1.44, BUN 20, Potassium 3.0, Sodium 144   Recommendations: No changes. Discussed to limit salt intake to 2000 mg/day and fluid intake to < 2 liters/day.  Encouraged to call for fluid symptoms or use local ER for any urgent symptoms.  Follow-up plan: ICM clinic phone appointment on 06/05/2017.    Copy of ICM check sent to device physician.   3 month ICM trend: 05/03/2017   1 Year ICM trend:      Karie Soda, RN 05/03/2017 11:56 AM

## 2017-05-24 ENCOUNTER — Emergency Department (HOSPITAL_COMMUNITY): Payer: Medicare Other

## 2017-05-24 ENCOUNTER — Inpatient Hospital Stay (HOSPITAL_COMMUNITY)
Admission: EM | Admit: 2017-05-24 | Discharge: 2017-05-29 | DRG: 291 | Disposition: A | Discharge status: Home / Self Care | Payer: Medicare Other | Attending: Internal Medicine | Admitting: Internal Medicine

## 2017-05-24 ENCOUNTER — Encounter (HOSPITAL_COMMUNITY): Payer: Self-pay

## 2017-05-24 DIAGNOSIS — E785 Hyperlipidemia, unspecified: Secondary | ICD-10-CM | POA: Diagnosis present

## 2017-05-24 DIAGNOSIS — Z981 Arthrodesis status: Secondary | ICD-10-CM

## 2017-05-24 DIAGNOSIS — I509 Heart failure, unspecified: Secondary | ICD-10-CM

## 2017-05-24 DIAGNOSIS — J9601 Acute respiratory failure with hypoxia: Secondary | ICD-10-CM | POA: Diagnosis present

## 2017-05-24 DIAGNOSIS — I255 Ischemic cardiomyopathy: Secondary | ICD-10-CM | POA: Diagnosis present

## 2017-05-24 DIAGNOSIS — R0602 Shortness of breath: Secondary | ICD-10-CM | POA: Diagnosis not present

## 2017-05-24 DIAGNOSIS — I480 Paroxysmal atrial fibrillation: Secondary | ICD-10-CM | POA: Diagnosis present

## 2017-05-24 DIAGNOSIS — J441 Chronic obstructive pulmonary disease with (acute) exacerbation: Secondary | ICD-10-CM | POA: Diagnosis present

## 2017-05-24 DIAGNOSIS — I5023 Acute on chronic systolic (congestive) heart failure: Secondary | ICD-10-CM

## 2017-05-24 DIAGNOSIS — E876 Hypokalemia: Secondary | ICD-10-CM | POA: Diagnosis present

## 2017-05-24 DIAGNOSIS — Z86718 Personal history of other venous thrombosis and embolism: Secondary | ICD-10-CM

## 2017-05-24 DIAGNOSIS — N183 Chronic kidney disease, stage 3 unspecified: Secondary | ICD-10-CM | POA: Diagnosis present

## 2017-05-24 DIAGNOSIS — Z95810 Presence of automatic (implantable) cardiac defibrillator: Secondary | ICD-10-CM | POA: Diagnosis present

## 2017-05-24 DIAGNOSIS — Z823 Family history of stroke: Secondary | ICD-10-CM

## 2017-05-24 DIAGNOSIS — F17200 Nicotine dependence, unspecified, uncomplicated: Secondary | ICD-10-CM | POA: Diagnosis present

## 2017-05-24 DIAGNOSIS — I495 Sick sinus syndrome: Secondary | ICD-10-CM | POA: Diagnosis present

## 2017-05-24 DIAGNOSIS — I481 Persistent atrial fibrillation: Secondary | ICD-10-CM | POA: Diagnosis present

## 2017-05-24 DIAGNOSIS — F17210 Nicotine dependence, cigarettes, uncomplicated: Secondary | ICD-10-CM | POA: Diagnosis present

## 2017-05-24 DIAGNOSIS — Z885 Allergy status to narcotic agent status: Secondary | ICD-10-CM

## 2017-05-24 DIAGNOSIS — I77810 Thoracic aortic ectasia: Secondary | ICD-10-CM | POA: Diagnosis present

## 2017-05-24 DIAGNOSIS — I083 Combined rheumatic disorders of mitral, aortic and tricuspid valves: Secondary | ICD-10-CM | POA: Diagnosis present

## 2017-05-24 DIAGNOSIS — I5043 Acute on chronic combined systolic (congestive) and diastolic (congestive) heart failure: Secondary | ICD-10-CM | POA: Diagnosis present

## 2017-05-24 DIAGNOSIS — I25810 Atherosclerosis of coronary artery bypass graft(s) without angina pectoris: Secondary | ICD-10-CM | POA: Diagnosis present

## 2017-05-24 DIAGNOSIS — I447 Left bundle-branch block, unspecified: Secondary | ICD-10-CM | POA: Diagnosis present

## 2017-05-24 DIAGNOSIS — I252 Old myocardial infarction: Secondary | ICD-10-CM

## 2017-05-24 DIAGNOSIS — I739 Peripheral vascular disease, unspecified: Secondary | ICD-10-CM | POA: Diagnosis present

## 2017-05-24 DIAGNOSIS — Z7982 Long term (current) use of aspirin: Secondary | ICD-10-CM

## 2017-05-24 DIAGNOSIS — Z951 Presence of aortocoronary bypass graft: Secondary | ICD-10-CM

## 2017-05-24 DIAGNOSIS — I482 Chronic atrial fibrillation: Secondary | ICD-10-CM | POA: Diagnosis present

## 2017-05-24 DIAGNOSIS — I130 Hypertensive heart and chronic kidney disease with heart failure and stage 1 through stage 4 chronic kidney disease, or unspecified chronic kidney disease: Secondary | ICD-10-CM | POA: Diagnosis not present

## 2017-05-24 DIAGNOSIS — I4821 Permanent atrial fibrillation: Secondary | ICD-10-CM | POA: Diagnosis present

## 2017-05-24 DIAGNOSIS — Z888 Allergy status to other drugs, medicaments and biological substances status: Secondary | ICD-10-CM

## 2017-05-24 DIAGNOSIS — Z8673 Personal history of transient ischemic attack (TIA), and cerebral infarction without residual deficits: Secondary | ICD-10-CM

## 2017-05-24 DIAGNOSIS — I2510 Atherosclerotic heart disease of native coronary artery without angina pectoris: Secondary | ICD-10-CM | POA: Diagnosis present

## 2017-05-24 MED ORDER — ALBUTEROL SULFATE (2.5 MG/3ML) 0.083% IN NEBU: 5.0000 mg | INHALATION_SOLUTION | Freq: Once | RESPIRATORY_TRACT | Status: DC

## 2017-05-24 NOTE — ED Provider Notes (Signed)
[facility]-EMERGENCY DEPT Provider Note   CSN: [id number] Arrival date & time: 05/24/17  2306 By signing my name below, I, [name] [name], attest that this documentation has been prepared under the direction and in the presence of [name] Monday, MD . Electronically Signed: Levon [name], Scribe. 05/25/2017. 12:02 AM.   History   Chief Complaint Chief Complaint  Patient presents with  . Shortness of Breath   HPI [name] [name] is a 76 y.o. male with a history of CAD, CHF, COPD, and MI who presents to the Emergency Department complaining of progressively worsening, severe shortness of breath onset yesterday. Pt reports associated wheezing, cough productive of yellow sputum, diaphoresis, and intermittent rhinorrhea. Symptoms are exacerbated by lying flat. He reports some temporary relief with his home breathing treatments. He was given albuterol and a Duoneb en route to the ED with moderate relief, but states his symptoms have not resolved. He is unsure if this feels similar to prior COPD exacerbations. He does not wear oxygen at home. Pt denies any CP, nausea, vomiting, fever, abdominal pain, difficulty urinating, leg pain, or BLE edema.  Reports he has been taking his lasix normally.   The history is provided by the patient. No language interpreter was used.    Past Medical History:  Diagnosis Date  . Aortic dissection (HCC)    H/O focal aortic dissection  . CAD (coronary artery disease)    a. s/p CABG;  cath 10/07:  LM ok, LAD occluded, AV CFX 95%, pRCA occluded; L-LAD, S-OM2/OM3, S-PDA ok  . Cerebrovascular disease    a. s/p prior Left CEA;  followed by Dr. Arbie [name]  . CHF (congestive heart failure) (HCC)    a. s/p STJ CRTD  . CKD (chronic kidney disease)   . COPD (chronic obstructive pulmonary disease) (HCC)   . Depression   . DVT (deep venous thrombosis) (HCC)   . History of stomach ulcers   . Hyperlipidemia   . Hypertension   . Iliac artery aneurysm, left (HCC)    followed by  Dr. Arbie [name]  . Ischemic cardiomyopathy    a. EF 25% by last myoview in 11/2015. s/p CRT-D  . LBBB (left bundle branch block)   . Myocardial infarction (HCC) 1997  . Nephrolithiasis   . PAF (paroxysmal atrial fibrillation) (HCC)    a. on amio and pradaxa. b. s/p AVN ablation  . Stroke [facility]) 12/2015 "several mini"   a. on coumadin --> switched to pradaxa b. s/p Watchman    Patient Active Problem List   Diagnosis Date Noted  . Acute on chronic systolic CHF (congestive heart failure) (HCC) 05/25/2017  . Hypokalemia 05/25/2017  . COPD with acute exacerbation (HCC) 05/25/2017  . History of aortic aneurysm repair 04/10/2017  . Tachycardia-bradycardia syndrome (HCC)   . Cerebrovascular disease 05/04/2016  . NSVT (nonsustained ventricular tachycardia) (HCC) 03/15/2016  . Non compliance with medical treatment 03/15/2016  . AKI (acute kidney injury) (HCC) 03/12/2016  . GI bleed-Feb 2017 (being cnsidered for Watchman device) 01/21/2016  . Abdominal aortic aneurysm without rupture (HCC) 01/03/2016  . Carpal tunnel syndrome 09/09/2015  . CKD (chronic kidney disease), stage III 03/04/2015  . HLD (hyperlipidemia) 03/01/2015  . BPPV (benign paroxysmal positional vertigo)   . Depression   . History of stroke-2005 12/23/2014  . Automatic implantable cardioverter-defibrillator in situ 08/26/2012  . Dizziness 06/24/2012  . Claudication (HCC) 04/15/2012  . COPD (chronic obstructive pulmonary disease) (HCC) 07/24/2011  . Long term current use of anticoagulant-Pradaxa 02/21/2011  . CAD- CABG '  98- cath 2007 and Aug 2013- Med Rx 10/12/2009  . ILIAC ARTERY ANEURYSM 10/12/2009  . Cardiomyopathy, ischemic-EF 25%  10/18/2008  . 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 Permanent atrial fibrillation (HCC) 10/18/2008    Past Surgical History:  Procedure Laterality Date  . ABDOMINAL AORTIC ANEURYSM REPAIR N/A 01/03/2016   Procedure: ANEURYSM ABDOMINAL AORTA, BILATERAL COMMON ILIAC REPAIR;  Surgeon: [name] Earthly,  MD;  Location: [facility] OR;  Service: Vascular;  Laterality: N/A;  . ABDOMINAL AORTIC ANEURYSM REPAIR  01/03/2016  . APPENDECTOMY  09/11/11  . BACK SURGERY    . BI-VENTRICULAR IMPLANTABLE CARDIOVERTER DEFIBRILLATOR N/A 08/22/2012   SJM Quadra Assura BiV ICD implanted by Dr [name] [name]  . CARDIAC CATHETERIZATION  1998; 2013  . CARDIOVERSION N/A 07/26/2016   Procedure: CARDIOVERSION;  Surgeon: [name] Masson, MD;  Location: [facility] ENDOSCOPY;  Service: Cardiovascular;  Laterality: N/A;  . CAROTID ENDARTERECTOMY Left July  2005  . CORONARY ARTERY BYPASS GRAFT  1998   CABG X5  . CYSTOSCOPY W/ STONE MANIPULATION  X 2  . ELECTROPHYSIOLOGIC STUDY N/A 08/17/2016   AVN ablation by Dr [name] [name]  . ESOPHAGOGASTRODUODENOSCOPY N/A 02/28/2016   Procedure: ESOPHAGOGASTRODUODENOSCOPY (EGD);  Surgeon: [name] Dare, MD;  Location[facility] ENDOSCOPY;  Service: Endoscopy;  Laterality: N/A;  . LEFT ATRIAL APPENDAGE OCCLUSION N/A 06/01/2016   Watchman device implanted by Dr [name] [name]  . POSTERIOR FUSION LUMBAR SPINE  Sep 27, 2014   L4-L5  . PR VEIN BYPASS GRAFT,AORTO-FEM-POP  01/03/2016  . TEE WITHOUT CARDIOVERSION N/A 07/26/2016   Procedure: TRANSESOPHAGEAL ECHOCARDIOGRAM (TEE);  Surgeon: [name] Masson, MD;  Location: [facility] ENDOSCOPY;  Service: Cardiovascular;  Laterality: N/A;       Home Medications    Prior to Admission medications   Medication Sig Start Date End Date Taking? Authorizing Provider  albuterol (PROVENTIL) (2.5 MG/3ML) 0.083% nebulizer solution USE ONE VIAL IN NEBULIZER EVERY 4 HOURS AS NEEDED FOR WHEEZING AND FOR SHORTNESS OF BREATH 04/13/17  Yes Leslye Peer, MD  amLODipine (NORVASC) 10 MG tablet Take 10 mg by mouth daily.   Yes [provider]  aspirin EC 325 MG EC tablet Take 1 tablet (325 mg total) by mouth daily. 08/09/16  Yes [name], Amber K, NP  citalopram (CELEXA) 10 MG tablet Take 10 mg by mouth daily. Reported on 06/06/2016   Yes [provider]  furosemide (LASIX) 20 MG tablet Take 3  tablets (60 mg total) by mouth daily. Patient taking differently: Take 60 mg by mouth every 3 (three) days.  05/18/16  Yes [name], Amber K, NP  ibuprofen (ADVIL,MOTRIN) 200 MG tablet Take 400 mg by mouth every 6 (six) hours as needed (For pain.).   Yes [provider]  lisinopril (PRINIVIL,ZESTRIL) 10 MG tablet Take 10 mg by mouth daily.  07/31/16  Yes [provider]  metoprolol succinate (TOPROL XL) 25 MG 24 hr tablet Take 1 tablet (25 mg total) by mouth daily. 08/07/16  Yes Lewayne Bunting, MD  Multiple Vitamin (MULITIVITAMIN WITH MINERALS) TABS Take 1 tablet by mouth daily.   Yes [provider]  niacin (NIASPAN) 500 MG CR tablet Take 1,000 mg by mouth 2 (two) times daily.    Yes [provider]  nitroGLYCERIN (NITROSTAT) 0.4 MG SL tablet Place 1 tablet (0.4 mg total) under the tongue every 5 (five) minutes as needed for chest pain. 02/29/16  Yes Lewayne Bunting, MD  potassium chloride SA ([name]-DUR,KLOR-CON) 20 MEQ tablet TAKE 2 TABLETS THREE TIMES DAILY. PLEASE SCHEDULE  APPOINTMENT FOR REFILLS. 09/18/16  Yes Lewayne Bunting, MD  pravastatin (PRAVACHOL) 80 MG tablet Take 1 tablet (80 mg total) by mouth daily. 12/18/16  Yes Lewayne Bunting, MD    Family History Family History  Problem Relation Age of Onset  . Stroke Maternal Uncle   . Stroke Maternal Uncle   . Heart attack Neg Hx   . Hypertension Neg Hx     Social History Social History  Substance Use Topics  . Smoking status: Current Some Day Smoker    Packs/day: 0.50    Years: 48.00    Types: Cigarettes    Last attempt to quit: 12/11/2004  . Smokeless tobacco: Never Used     Comment: stated "I'm trying to quit  . Alcohol use 0.0 oz/week     Comment: 06/01/2016 "haven't had a drink in years"     Allergies   Buprenorphine hcl and Morphine and related   Review of Systems Review of Systems  Constitutional: Positive for diaphoresis. Negative for fever.  HENT: Positive for rhinorrhea.     Respiratory: Positive for cough, shortness of breath and wheezing.   Cardiovascular: Negative for chest pain and leg swelling.  Gastrointestinal: Negative for abdominal pain, nausea and vomiting.  Genitourinary: Negative for difficulty urinating.  Musculoskeletal: Negative for myalgias.    Physical Exam Updated Vital Signs BP (!) 160/71 (BP Location: Left Arm)   Pulse 75   Temp 98 F (36.7 C) (Oral)   Resp 18   Ht 6\' 1"  (1.854 m)   Wt 84.6 kg (186 lb 6.4 oz)   SpO2 98%   BMI 24.59 kg/m   Physical Exam  Constitutional: He is oriented to person, place, and time. He appears well-developed and well-nourished. No distress.  HENT:  Head: Normocephalic and atraumatic.  Eyes: Conjunctivae and EOM are normal.  Neck: Normal range of motion.  Cardiovascular: Normal rate, regular rhythm, normal heart sounds and intact distal pulses.  Exam reveals no gallop and no friction rub.   No murmur heard. Equal pulses in all four extremities   Pulmonary/Chest: Effort normal. No respiratory distress. He has wheezes. He has no rales.  Inspiratory and expiratory wheezes diffusely.   Abdominal: Soft. He exhibits no distension. There is no tenderness. There is no guarding.  Musculoskeletal: Normal range of motion. He exhibits edema (1+ pitting edema bilaterally).  Neurological: He is alert and oriented to person, place, and time.  Skin: Skin is warm and dry. He is not diaphoretic.  Psychiatric: He has a normal mood and affect. Judgment normal.  Nursing note and vitals reviewed.   ED Treatments / Results  DIAGNOSTIC STUDIES:  Oxygen Saturation is 95% on 2L [place], adequate by my interpretation.    COORDINATION OF CARE:  11:27 PM Discussed treatment plan with pt at bedside and pt agreed to plan.   Labs (all labs ordered are listed, but only abnormal results are displayed) Labs Reviewed  MRSA PCR SCREENING - Abnormal; Notable for the following:       Result Value   MRSA by PCR POSITIVE (*)     All other components within normal limits  CBC WITH DIFFERENTIAL/PLATELET - Abnormal; Notable for the following:    Hemoglobin 12.6 (*)    RDW 17.5 (*)    All other components within normal limits  COMPREHENSIVE METABOLIC PANEL - Abnormal; Notable for the following:    Potassium 2.8 (*)    Glucose, Bld 116 (*)    Creatinine, Ser 1.32 (*)  Calcium 8.5 (*)    Total Protein 5.8 (*)    Albumin 3.4 (*)    GFR calc non Af Amer 51 (*)    GFR calc Af Amer 60 (*)    All other components within normal limits  BRAIN NATRIURETIC PEPTIDE - Abnormal; Notable for the following:    B Natriuretic Peptide 2,353.9 (*)    All other components within normal limits  RESPIRATORY PANEL BY PCR  MAGNESIUM  COMPREHENSIVE METABOLIC PANEL  MAGNESIUM  I-STAT TROPOININ, ED    EKG  EKG Interpretation  Date/Time:  Thursday May 24 2017 23:12:51 EDT Ventricular Rate:  75 PR Interval:    QRS Duration: 181 QT Interval:  533 QTC Calculation: 596 R Axis:   -89 Text Interpretation:  Afib/flutter and ventricular-paced rhythm No further analysis attempted due to paced rhythm No significant change since last tracing Confirmed by [name] [name] ([id number]) on 05/24/2017 11:23:56 PM Also confirmed by [name] [name] [id number]), editor Elita Quick ([id number])  on 05/25/2017 6:46:20 AM       Radiology Dg Chest 2 View  Result Date: 05/25/2017 CLINICAL DATA:  Shortness of breath EXAM: CHEST  2 VIEW COMPARISON:  03/12/2016 FINDINGS: Left-sided pacing device as before. Post sternotomy changes. Vascular occlusive device on lateral view. Cardiomegaly with central vascular congestion. No focal infiltrate or effusion. Aortic atherosclerosis. Tiny bilateral effusions. No pneumothorax. Vertebral augmentation changes at the upper lumbar spine. Increased interstitial opacity at the lung bases. IMPRESSION: 1. Cardiomegaly with mild central congestion. No edema or infiltrate. 2. Increased interstitial opacity at the lung bases could  relate to bronchial inflammation. Electronically Signed   By: Jasmine Pang M.D.   On: 05/25/2017 00:00    Procedures Procedures (including critical care time)  Medications Ordered in ED Medications  pravastatin (PRAVACHOL) tablet 80 mg (not administered)  aspirin EC tablet 325 mg (not administered)  lisinopril (PRINIVIL,ZESTRIL) tablet 10 mg (not administered)  metoprolol succinate (TOPROL-XL) 24 hr tablet 25 mg (not administered)  amLODipine (NORVASC) tablet 10 mg (not administered)  citalopram (CELEXA) tablet 10 mg (not administered)  methylPREDNISolone sodium succinate (SOLU-MEDROL) 125 mg/2 mL injection 60 mg (60 mg Intravenous Given 05/25/17 0815)  doxycycline (VIBRA-TABS) tablet 100 mg (not administered)  albuterol (PROVENTIL) (2.5 MG/3ML) 0.083% nebulizer solution 2.5 mg (2.5 mg Nebulization Given 05/25/17 0829)  albuterol (PROVENTIL) (2.5 MG/3ML) 0.083% nebulizer solution 2.5 mg (not administered)  acetaminophen (TYLENOL) tablet 650 mg (not administered)    Or  acetaminophen (TYLENOL) suppository 650 mg (not administered)  enoxaparin (LOVENOX) injection 40 mg (not administered)  mupirocin ointment (BACTROBAN) 2 % 1 application (not administered)  Chlorhexidine Gluconate Cloth 2 % PADS 6 each (not administered)  potassium chloride SA ([name]-DUR,KLOR-CON) CR tablet 40 mEq (not administered)  furosemide (LASIX) injection 60 mg (not administered)  magnesium sulfate IVPB 1 g 100 mL (not administered)  methylPREDNISolone sodium succinate (SOLU-MEDROL) 125 mg/2 mL injection 125 mg (125 mg Intravenous Given 05/25/17 0011)  potassium chloride SA ([name]-DUR,KLOR-CON) CR tablet 40 mEq (40 mEq Oral Given 05/25/17 0143)  magnesium sulfate IVPB 2 g 50 mL (0 g Intravenous Stopped 05/25/17 0246)  furosemide (LASIX) injection 60 mg (60 mg Intravenous Given 05/25/17 0449)     Initial Impression / Assessment and Plan / ED Course  I have reviewed the triage vital signs and the nursing notes.  Pertinent  labs & imaging results that were available during my care of the patient were reviewed by me and considered in my medical decision making (see chart for details).  Clinical Course as of May 25 957  Fri May 25, 2017  0124 Pt states his breathing has improved but his wheezing is worsening. Will order Albuterol.   [EH]    Clinical Course User Index [EH] [name] [name]     76 year old male with a history of coronary artery disease, CHF, COPD, DVT, paroxysmal atrial fibrillation, CVA, aortic aneurysm repair, NSVT, ICD who presents with cough, wheezing, shortness of breath. Exam consistent with suspected COPD exacerbation, initially given 2 nebs and Solu-Medrol. Initially patient had improvement, however his dyspnea returned. He is then given albuterol continuous nebulizer and magnesium. Troponin and EKG without significant changes. Chest XR without edema, pt reports peripheral swelling less than prior.  Overall suspect COPD more likely than PE as etiology of symptoms. BNP ordered given orthopnea and shows significant elevation.  Pt with likely mixed COPD and CHF exacerbation. Given lasix. Admitted for further care.   Final Clinical Impressions(s) / ED Diagnoses   Final diagnoses:  COPD exacerbation (HCC)  Acute on chronic congestive heart failure, unspecified heart failure type [facility])    New Prescriptions Current Discharge Medication List     I personally performed the services described in this documentation, which was scribed in my presence. The recorded information has been reviewed and is accurate.    [name] Monday, MD 05/25/17 [phone]

## 2017-05-24 NOTE — ED Triage Notes (Signed)
Patient here from home for shortness of breath for 2 days.  Has hx of COD and stated breathing becoming more difficult over the past 2 days.  95% on 2L and EMS gave albuterol and a duoneb.  Still having expiratory wheezing.  Stated not in any pain.  Vitals stable.

## 2017-05-25 ENCOUNTER — Encounter (HOSPITAL_COMMUNITY): Payer: Self-pay | Admitting: Family Medicine

## 2017-05-25 ENCOUNTER — Other Ambulatory Visit (HOSPITAL_COMMUNITY): Payer: Medicare Other

## 2017-05-25 DIAGNOSIS — I25700 Atherosclerosis of coronary artery bypass graft(s), unspecified, with unstable angina pectoris: Secondary | ICD-10-CM | POA: Diagnosis not present

## 2017-05-25 DIAGNOSIS — Z86718 Personal history of other venous thrombosis and embolism: Secondary | ICD-10-CM | POA: Diagnosis not present

## 2017-05-25 DIAGNOSIS — Z95810 Presence of automatic (implantable) cardiac defibrillator: Secondary | ICD-10-CM

## 2017-05-25 DIAGNOSIS — N183 Chronic kidney disease, stage 3 (moderate): Secondary | ICD-10-CM | POA: Diagnosis present

## 2017-05-25 DIAGNOSIS — I495 Sick sinus syndrome: Secondary | ICD-10-CM | POA: Diagnosis not present

## 2017-05-25 DIAGNOSIS — J441 Chronic obstructive pulmonary disease with (acute) exacerbation: Secondary | ICD-10-CM | POA: Diagnosis present

## 2017-05-25 DIAGNOSIS — J9601 Acute respiratory failure with hypoxia: Secondary | ICD-10-CM | POA: Diagnosis present

## 2017-05-25 DIAGNOSIS — E785 Hyperlipidemia, unspecified: Secondary | ICD-10-CM | POA: Diagnosis present

## 2017-05-25 DIAGNOSIS — Z885 Allergy status to narcotic agent status: Secondary | ICD-10-CM | POA: Diagnosis not present

## 2017-05-25 DIAGNOSIS — Z981 Arthrodesis status: Secondary | ICD-10-CM | POA: Diagnosis not present

## 2017-05-25 DIAGNOSIS — Z951 Presence of aortocoronary bypass graft: Secondary | ICD-10-CM | POA: Diagnosis not present

## 2017-05-25 DIAGNOSIS — I447 Left bundle-branch block, unspecified: Secondary | ICD-10-CM | POA: Diagnosis present

## 2017-05-25 DIAGNOSIS — I480 Paroxysmal atrial fibrillation: Secondary | ICD-10-CM | POA: Diagnosis present

## 2017-05-25 DIAGNOSIS — Z8673 Personal history of transient ischemic attack (TIA), and cerebral infarction without residual deficits: Secondary | ICD-10-CM | POA: Diagnosis not present

## 2017-05-25 DIAGNOSIS — I5023 Acute on chronic systolic (congestive) heart failure: Secondary | ICD-10-CM

## 2017-05-25 DIAGNOSIS — E876 Hypokalemia: Secondary | ICD-10-CM | POA: Diagnosis not present

## 2017-05-25 DIAGNOSIS — I340 Nonrheumatic mitral (valve) insufficiency: Secondary | ICD-10-CM | POA: Diagnosis not present

## 2017-05-25 DIAGNOSIS — Z888 Allergy status to other drugs, medicaments and biological substances status: Secondary | ICD-10-CM | POA: Diagnosis not present

## 2017-05-25 DIAGNOSIS — I5043 Acute on chronic combined systolic (congestive) and diastolic (congestive) heart failure: Secondary | ICD-10-CM | POA: Diagnosis not present

## 2017-05-25 DIAGNOSIS — I481 Persistent atrial fibrillation: Secondary | ICD-10-CM | POA: Diagnosis present

## 2017-05-25 DIAGNOSIS — I360 Nonrheumatic tricuspid (valve) stenosis: Secondary | ICD-10-CM | POA: Diagnosis not present

## 2017-05-25 DIAGNOSIS — I482 Chronic atrial fibrillation: Secondary | ICD-10-CM

## 2017-05-25 DIAGNOSIS — Z823 Family history of stroke: Secondary | ICD-10-CM | POA: Diagnosis not present

## 2017-05-25 DIAGNOSIS — F17200 Nicotine dependence, unspecified, uncomplicated: Secondary | ICD-10-CM | POA: Diagnosis not present

## 2017-05-25 DIAGNOSIS — I2510 Atherosclerotic heart disease of native coronary artery without angina pectoris: Secondary | ICD-10-CM | POA: Diagnosis present

## 2017-05-25 DIAGNOSIS — I130 Hypertensive heart and chronic kidney disease with heart failure and stage 1 through stage 4 chronic kidney disease, or unspecified chronic kidney disease: Secondary | ICD-10-CM | POA: Diagnosis present

## 2017-05-25 DIAGNOSIS — F17210 Nicotine dependence, cigarettes, uncomplicated: Secondary | ICD-10-CM | POA: Diagnosis present

## 2017-05-25 DIAGNOSIS — I255 Ischemic cardiomyopathy: Secondary | ICD-10-CM | POA: Diagnosis not present

## 2017-05-25 DIAGNOSIS — Z7982 Long term (current) use of aspirin: Secondary | ICD-10-CM | POA: Diagnosis not present

## 2017-05-25 DIAGNOSIS — I252 Old myocardial infarction: Secondary | ICD-10-CM | POA: Diagnosis not present

## 2017-05-25 DIAGNOSIS — R0602 Shortness of breath: Secondary | ICD-10-CM | POA: Diagnosis present

## 2017-05-25 LAB — CBC WITH DIFFERENTIAL/PLATELET
Basophils Absolute: 0.1 10*3/uL (ref 0.0–0.1)
Basophils Relative: 1 %
Eosinophils Absolute: 0.3 10*3/uL (ref 0.0–0.7)
Eosinophils Relative: 4 %
HEMATOCRIT: 40.7 % (ref 39.0–52.0)
HEMOGLOBIN: 12.6 g/dL — AB (ref 13.0–17.0)
LYMPHS ABS: 0.7 10*3/uL (ref 0.7–4.0)
Lymphocytes Relative: 10 %
MCH: 26.9 pg (ref 26.0–34.0)
MCHC: 31 g/dL (ref 30.0–36.0)
MCV: 87 fL (ref 78.0–100.0)
MONOS PCT: 7 %
Monocytes Absolute: 0.5 10*3/uL (ref 0.1–1.0)
NEUTROS ABS: 5.8 10*3/uL (ref 1.7–7.7)
NEUTROS PCT: 78 %
Platelets: 162 10*3/uL (ref 150–400)
RBC: 4.68 MIL/uL (ref 4.22–5.81)
RDW: 17.5 % — ABNORMAL HIGH (ref 11.5–15.5)
WBC: 7.4 10*3/uL (ref 4.0–10.5)

## 2017-05-25 LAB — COMPREHENSIVE METABOLIC PANEL
ALK PHOS: 69 U/L (ref 38–126)
ALT: 19 U/L (ref 17–63)
ALT: 20 U/L (ref 17–63)
ANION GAP: 13 (ref 5–15)
ANION GAP: 8 (ref 5–15)
AST: 21 U/L (ref 15–41)
AST: 28 U/L (ref 15–41)
Albumin: 3.4 g/dL — ABNORMAL LOW (ref 3.5–5.0)
Albumin: 3.4 g/dL — ABNORMAL LOW (ref 3.5–5.0)
Alkaline Phosphatase: 75 U/L (ref 38–126)
BILIRUBIN TOTAL: 0.9 mg/dL (ref 0.3–1.2)
BILIRUBIN TOTAL: 1.2 mg/dL (ref 0.3–1.2)
BUN: 19 mg/dL (ref 6–20)
BUN: 20 mg/dL (ref 6–20)
CALCIUM: 8.5 mg/dL — AB (ref 8.9–10.3)
CO2: 24 mmol/L (ref 22–32)
CO2: 26 mmol/L (ref 22–32)
Calcium: 8.5 mg/dL — ABNORMAL LOW (ref 8.9–10.3)
Chloride: 105 mmol/L (ref 101–111)
Chloride: 107 mmol/L (ref 101–111)
Creatinine, Ser: 1.32 mg/dL — ABNORMAL HIGH (ref 0.61–1.24)
Creatinine, Ser: 1.41 mg/dL — ABNORMAL HIGH (ref 0.61–1.24)
GFR calc non Af Amer: 51 mL/min — ABNORMAL LOW (ref >60)
GFR, EST AFRICAN AMERICAN: 55 mL/min — AB (ref >60)
GFR, EST AFRICAN AMERICAN: 60 mL/min — AB (ref >60)
GFR, EST NON AFRICAN AMERICAN: 48 mL/min — AB (ref >60)
Glucose, Bld: 116 mg/dL — ABNORMAL HIGH (ref 65–99)
Glucose, Bld: 270 mg/dL — ABNORMAL HIGH (ref 65–99)
POTASSIUM: 2.8 mmol/L — AB (ref 3.5–5.1)
Potassium: 2.8 mmol/L — ABNORMAL LOW (ref 3.5–5.1)
Sodium: 141 mmol/L (ref 135–145)
Sodium: 142 mmol/L (ref 135–145)
TOTAL PROTEIN: 5.8 g/dL — AB (ref 6.5–8.1)
TOTAL PROTEIN: 6.1 g/dL — AB (ref 6.5–8.1)

## 2017-05-25 LAB — RESPIRATORY PANEL BY PCR
Adenovirus: NOT DETECTED
BORDETELLA PERTUSSIS-RVPCR: NOT DETECTED
CHLAMYDOPHILA PNEUMONIAE-RVPPCR: NOT DETECTED
CORONAVIRUS 229E-RVPPCR: NOT DETECTED
CORONAVIRUS HKU1-RVPPCR: NOT DETECTED
Coronavirus NL63: NOT DETECTED
Coronavirus OC43: NOT DETECTED
Influenza A: NOT DETECTED
Influenza B: NOT DETECTED
Metapneumovirus: NOT DETECTED
Mycoplasma pneumoniae: NOT DETECTED
PARAINFLUENZA VIRUS 3-RVPPCR: NOT DETECTED
Parainfluenza Virus 1: NOT DETECTED
Parainfluenza Virus 2: NOT DETECTED
Parainfluenza Virus 4: NOT DETECTED
RHINOVIRUS / ENTEROVIRUS - RVPPCR: NOT DETECTED
Respiratory Syncytial Virus: NOT DETECTED

## 2017-05-25 LAB — MAGNESIUM
MAGNESIUM: 1.7 mg/dL (ref 1.7–2.4)
MAGNESIUM: 1.8 mg/dL (ref 1.7–2.4)

## 2017-05-25 LAB — BRAIN NATRIURETIC PEPTIDE: B Natriuretic Peptide: 2353.9 pg/mL — ABNORMAL HIGH (ref 0.0–100.0)

## 2017-05-25 LAB — MRSA PCR SCREENING: MRSA by PCR: POSITIVE — AB

## 2017-05-25 LAB — I-STAT TROPONIN, ED: TROPONIN I, POC: 0.01 ng/mL (ref 0.00–0.08)

## 2017-05-25 MED ORDER — ACETAMINOPHEN 325 MG PO TABS: 650.0000 mg | ORAL_TABLET | Freq: Four times a day (QID) | ORAL | Status: DC | PRN

## 2017-05-25 MED ORDER — DOXYCYCLINE HYCLATE 100 MG PO TABS
100.0000 mg | ORAL_TABLET | Freq: Two times a day (BID) | ORAL | Status: DC
  Administered 2017-05-25 – 2017-05-29 (×9): 100 mg via ORAL
  Filled 2017-05-25 (×10): qty 1

## 2017-05-25 MED ORDER — ALBUTEROL SULFATE (2.5 MG/3ML) 0.083% IN NEBU
2.5000 mg | INHALATION_SOLUTION | Freq: Four times a day (QID) | RESPIRATORY_TRACT | Status: DC
  Administered 2017-05-25: 2.5 mg via RESPIRATORY_TRACT
  Filled 2017-05-25: qty 3

## 2017-05-25 MED ORDER — ALBUTEROL SULFATE (2.5 MG/3ML) 0.083% IN NEBU: 2.5000 mg | INHALATION_SOLUTION | RESPIRATORY_TRACT | Status: DC | PRN

## 2017-05-25 MED ORDER — POTASSIUM CHLORIDE CRYS ER 20 MEQ PO TBCR: 40.0000 meq | EXTENDED_RELEASE_TABLET | Freq: Two times a day (BID) | ORAL | Status: DC

## 2017-05-25 MED ORDER — MOMETASONE FURO-FORMOTEROL FUM 200-5 MCG/ACT IN AERO
2.0000 | INHALATION_SPRAY | Freq: Two times a day (BID) | RESPIRATORY_TRACT | Status: DC
  Administered 2017-05-25 – 2017-05-29 (×8): 2 via RESPIRATORY_TRACT
  Filled 2017-05-25: qty 8.8

## 2017-05-25 MED ORDER — ACETAMINOPHEN 650 MG RE SUPP: 650.0000 mg | Freq: Four times a day (QID) | RECTAL | Status: DC | PRN

## 2017-05-25 MED ORDER — METOPROLOL SUCCINATE ER 25 MG PO TB24
25.0000 mg | ORAL_TABLET | Freq: Every day | ORAL | Status: DC
  Administered 2017-05-25 – 2017-05-27 (×3): 25 mg via ORAL
  Filled 2017-05-25 (×3): qty 1

## 2017-05-25 MED ORDER — IPRATROPIUM-ALBUTEROL 0.5-2.5 (3) MG/3ML IN SOLN
3.0000 mL | Freq: Three times a day (TID) | RESPIRATORY_TRACT | Status: DC
  Administered 2017-05-26 – 2017-05-29 (×10): 3 mL via RESPIRATORY_TRACT
  Filled 2017-05-25 (×10): qty 3

## 2017-05-25 MED ORDER — ASPIRIN EC 325 MG PO TBEC
325.0000 mg | DELAYED_RELEASE_TABLET | Freq: Every day | ORAL | Status: DC
  Administered 2017-05-25 – 2017-05-29 (×5): 325 mg via ORAL
  Filled 2017-05-25 (×5): qty 1

## 2017-05-25 MED ORDER — CHLORHEXIDINE GLUCONATE CLOTH 2 % EX PADS
6.0000 | MEDICATED_PAD | Freq: Every day | CUTANEOUS | Status: DC
  Administered 2017-05-25 – 2017-05-29 (×2): 6 via TOPICAL

## 2017-05-25 MED ORDER — MAGNESIUM SULFATE 2 GM/50ML IV SOLN
2.0000 g | Freq: Once | INTRAVENOUS | Status: AC
Start: 2017-05-25 — End: 2017-05-25
  Administered 2017-05-25: 2 g via INTRAVENOUS
  Filled 2017-05-25: qty 50

## 2017-05-25 MED ORDER — POTASSIUM CHLORIDE CRYS ER 20 MEQ PO TBCR
40.0000 meq | EXTENDED_RELEASE_TABLET | Freq: Three times a day (TID) | ORAL | Status: DC
  Administered 2017-05-25: 40 meq via ORAL
  Filled 2017-05-25: qty 2

## 2017-05-25 MED ORDER — POTASSIUM CHLORIDE CRYS ER 20 MEQ PO TBCR
40.0000 meq | EXTENDED_RELEASE_TABLET | Freq: Once | ORAL | Status: AC
  Administered 2017-05-25: 40 meq via ORAL
  Filled 2017-05-25: qty 2

## 2017-05-25 MED ORDER — LISINOPRIL 10 MG PO TABS
10.0000 mg | ORAL_TABLET | Freq: Every day | ORAL | Status: DC
  Administered 2017-05-25 – 2017-05-27 (×3): 10 mg via ORAL
  Filled 2017-05-25 (×3): qty 1

## 2017-05-25 MED ORDER — POTASSIUM CHLORIDE CRYS ER 20 MEQ PO TBCR
60.0000 meq | EXTENDED_RELEASE_TABLET | Freq: Three times a day (TID) | ORAL | Status: AC
  Administered 2017-05-25 (×2): 60 meq via ORAL
  Filled 2017-05-25 (×2): qty 3

## 2017-05-25 MED ORDER — METHYLPREDNISOLONE SODIUM SUCC 125 MG IJ SOLR
60.0000 mg | Freq: Two times a day (BID) | INTRAMUSCULAR | Status: DC
  Administered 2017-05-25 – 2017-05-29 (×9): 60 mg via INTRAVENOUS
  Filled 2017-05-25 (×9): qty 2

## 2017-05-25 MED ORDER — CITALOPRAM HYDROBROMIDE 10 MG PO TABS
10.0000 mg | ORAL_TABLET | Freq: Every day | ORAL | Status: DC
  Administered 2017-05-25 – 2017-05-29 (×5): 10 mg via ORAL
  Filled 2017-05-25 (×6): qty 1

## 2017-05-25 MED ORDER — FUROSEMIDE 10 MG/ML IJ SOLN: 60.0000 mg | Freq: Every day | INTRAMUSCULAR | Status: DC

## 2017-05-25 MED ORDER — MAGNESIUM SULFATE IN D5W 1-5 GM/100ML-% IV SOLN
1.0000 g | Freq: Once | INTRAVENOUS | Status: AC
  Administered 2017-05-25: 1 g via INTRAVENOUS
  Filled 2017-05-25: qty 100

## 2017-05-25 MED ORDER — ALBUTEROL (5 MG/ML) CONTINUOUS INHALATION SOLN
10.0000 mg/h | INHALATION_SOLUTION | RESPIRATORY_TRACT | Status: DC
  Administered 2017-05-25: 10 mg/h via RESPIRATORY_TRACT

## 2017-05-25 MED ORDER — ENOXAPARIN SODIUM 40 MG/0.4ML ~~LOC~~ SOLN
40.0000 mg | SUBCUTANEOUS | Status: DC
  Administered 2017-05-25 – 2017-05-27 (×3): 40 mg via SUBCUTANEOUS
  Filled 2017-05-25 (×5): qty 0.4

## 2017-05-25 MED ORDER — IPRATROPIUM-ALBUTEROL 0.5-2.5 (3) MG/3ML IN SOLN
3.0000 mL | Freq: Three times a day (TID) | RESPIRATORY_TRACT | Status: DC
  Administered 2017-05-25 (×2): 3 mL via RESPIRATORY_TRACT
  Filled 2017-05-25 (×2): qty 3

## 2017-05-25 MED ORDER — FUROSEMIDE 10 MG/ML IJ SOLN
60.0000 mg | Freq: Once | INTRAMUSCULAR | Status: AC
  Administered 2017-05-25: 60 mg via INTRAVENOUS
  Filled 2017-05-25: qty 6

## 2017-05-25 MED ORDER — MUPIROCIN 2 % EX OINT
1.0000 "application " | TOPICAL_OINTMENT | Freq: Two times a day (BID) | CUTANEOUS | Status: DC
  Administered 2017-05-25 – 2017-05-29 (×9): 1 via NASAL
  Filled 2017-05-25 (×2): qty 22

## 2017-05-25 MED ORDER — PRAVASTATIN SODIUM 40 MG PO TABS
80.0000 mg | ORAL_TABLET | Freq: Every day | ORAL | Status: DC
  Administered 2017-05-25 – 2017-05-28 (×4): 80 mg via ORAL
  Filled 2017-05-25 (×4): qty 2

## 2017-05-25 MED ORDER — METHYLPREDNISOLONE SODIUM SUCC 125 MG IJ SOLR
125.0000 mg | Freq: Once | INTRAMUSCULAR | Status: AC
  Administered 2017-05-25: 125 mg via INTRAVENOUS
  Filled 2017-05-25: qty 2

## 2017-05-25 MED ORDER — AMLODIPINE BESYLATE 10 MG PO TABS
10.0000 mg | ORAL_TABLET | Freq: Every day | ORAL | Status: DC
  Administered 2017-05-25 – 2017-05-27 (×3): 10 mg via ORAL
  Filled 2017-05-25 (×3): qty 1

## 2017-05-25 MED ORDER — FUROSEMIDE 10 MG/ML IJ SOLN
60.0000 mg | Freq: Two times a day (BID) | INTRAMUSCULAR | Status: DC
Start: 2017-05-25 — End: 2017-05-26
  Administered 2017-05-25 – 2017-05-26 (×3): 60 mg via INTRAVENOUS
  Filled 2017-05-25 (×2): qty 6

## 2017-05-25 MED ORDER — IPRATROPIUM-ALBUTEROL 0.5-2.5 (3) MG/3ML IN SOLN
3.0000 mL | RESPIRATORY_TRACT | Status: DC
  Administered 2017-05-25: 3 mL via RESPIRATORY_TRACT
  Filled 2017-05-25 (×2): qty 3

## 2017-05-25 NOTE — Progress Notes (Signed)
MRSA screening positive.  Order set ordered per protocol.

## 2017-05-25 NOTE — Progress Notes (Signed)
CAT STARTED  

## 2017-05-25 NOTE — Progress Notes (Signed)
Patient arrived in the unit from ED accompanied by NT via stretcher. Orientation to the unit  given. Patient verbalizes understanding. Telemetry box in place. Bed alarm activated.

## 2017-05-25 NOTE — ED Notes (Signed)
Patient states he feels like he is getting sob however is talking on the phone with family able to speak in complete sentences.

## 2017-05-25 NOTE — Progress Notes (Signed)
Patient seen and examined  [name] [name] is a 77 y.o. male with a past medical history significant for CHF EF 25% with ICD/pacer, COPD not on home O2, CKD III, CAD s/p remote CABG, recent aorta repair, pAF with watchman, and remote CVA who presents with dyspnea, wheezing and orthopnea for one week. Plan  continue treatment for COPD exacerbation and CHF exacerbation Replete electrolytes Cardiology consulted for chf exacerbation

## 2017-05-25 NOTE — H&P (Signed)
History and Physical  Patient Name: [name] [name]     ZOX:[id number]    DOB: [birth year]    DOA: 05/24/2017 PCP: [name], MD  Patient coming from: Home  Chief Complaint: Dyspnea, wheezing      HPI: [name] [name] is a 76 y.o. male with a past medical history significant for CHF EF 25% with ICD/pacer, COPD not on home O2, CKD III, CAD s/p remote CABG, recent aorta repair, pAF with watchman, and remote CVA who presents with dyspnea, wheezing and orthopnea for one week.  She was in his usual state of health until about one week ago when he started to have slowly progressive onset of dyspnea. This occurred at rest was worse with exertion, also associated with wheezing, rhinorrhea, and cough productive of yellow sputum. He tried using his albuterol with Minimal relief, until the last few days and has been using albuterol frequently through the day. He did have significant sleep disturbance from orthopnea relieved with sitting up, but no leg swelling.  Several days ago he suspected it might be CHF instead so he tried taking his Lasix (he takes this only PRN) without improvement.    Finally today, he was so short of breathe he couldn't shave, so he called EMS.  ED course: -Afebrile, heart rate 70, respirations 23-25, pulse oximetry 95% on 2L by nasal cannula, blood pressure 162/82 -Na 141, K 2.8, Cr 1.32 (baseline 1.4), WBC 7.4K, Hgb 12.6 -BNP >2500 -Troponin negative -CXR without pneumonia, without overt edema -He was very wheezy on exam, received Solu-medrol 125 mg IV, albuterol and magnesium in the ER, then furosemide IV 60 mg and TRH were asked to evaluate for dyspnea     ROS: Review of Systems  Constitutional: Negative for chills and fever.  HENT: Positive for congestion.   Respiratory: Positive for cough, sputum production, shortness of breath and wheezing.   Cardiovascular: Positive for orthopnea and PND. Negative for chest pain, palpitations and leg swelling.  All other  systems reviewed and are negative.         Past Medical History:  Diagnosis Date  . Aortic dissection (HCC)    H/O focal aortic dissection  . CAD (coronary artery disease)    a. s/p CABG;  cath 10/07:  LM ok, LAD occluded, AV CFX 95%, pRCA occluded; L-LAD, S-OM2/OM3, S-PDA ok  . Cerebrovascular disease    a. s/p prior Left CEA;  followed by Dr. [name]  . CHF (congestive heart failure) (HCC)    a. s/p STJ CRTD  . CKD (chronic kidney disease)   . COPD (chronic obstructive pulmonary disease) (HCC)   . Depression   . DVT (deep venous thrombosis) (HCC)   . History of stomach ulcers   . Hyperlipidemia   . Hypertension   . Iliac artery aneurysm, left (HCC)    followed by Dr. [name]  . Ischemic cardiomyopathy    a. EF 25% by last myoview in 11/2015. s/p CRT-D  . LBBB (left bundle branch block)   . Myocardial infarction (HCC) 1997  . Nephrolithiasis   . PAF (paroxysmal atrial fibrillation) (HCC)    a. on amio and pradaxa. b. s/p AVN ablation  . Stroke [facility]) 12/2015 "several mini"   a. on coumadin --> switched to pradaxa b. s/p Watchman    Past Surgical History:  Procedure Laterality Date  . ABDOMINAL AORTIC ANEURYSM REPAIR N/A 01/03/2016   Procedure: ANEURYSM ABDOMINAL AORTA, BILATERAL COMMON ILIAC REPAIR;  Surgeon: [name], MD;  Location:  [facility] OR;  Service: Vascular;  Laterality: N/A;  . ABDOMINAL AORTIC ANEURYSM REPAIR  01/03/2016  . APPENDECTOMY  09/11/11  . BACK SURGERY    . BI-VENTRICULAR IMPLANTABLE CARDIOVERTER DEFIBRILLATOR N/A 08/22/2012   SJM Quadra Assura BiV ICD implanted by Dr [name]  . CARDIAC CATHETERIZATION  1998; 2013  . CARDIOVERSION N/A 07/26/2016   Procedure: CARDIOVERSION;  Surgeon: Lars Masson, MD;  Location: [facility] ENDOSCOPY;  Service: Cardiovascular;  Laterality: N/A;  . CAROTID ENDARTERECTOMY Left July  2005  . CORONARY ARTERY BYPASS GRAFT  1998   CABG X5  . CYSTOSCOPY W/ STONE MANIPULATION  X 2  . ELECTROPHYSIOLOGIC STUDY N/A 08/17/2016   AVN  ablation by Dr [name]  . ESOPHAGOGASTRODUODENOSCOPY N/A 02/28/2016   Procedure: ESOPHAGOGASTRODUODENOSCOPY (EGD);  Surgeon: [name], MD;  Location[facility] END[facility]COPY;  Service: Endoscopy;  Laterality: N/A;  . LEFT ATRIAL APPENDAGE OCCLUSION N/A 06/01/2016   Watchman device implanted by Dr [name]  . POSTERIOR FUSION LUMBAR SPINE  Sep 27, 2014   L4-L5  . PR VEIN BYPASS GRAFT,AORTO-FEM-POP  01/03/2016  . TEE WITHOUT CARDIOVERSION N/A 07/26/2016   Procedure: TRANSESOPHAGEAL ECHOCARDIOGRAM (TEE);  Surgeon: Lars Masson, MD;  Location: [facility] ENDOSCOPY;  Service: Cardiovascular;  Laterality: N/A;    Social History: Patient lives with his son and daughter in law.  The patient walks unassisted.  Smoker.  He used to smoke, quit in 2004, started again last year.  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  Allergies  Allergen Reactions  . Buprenorphine Hcl Other (See Comments)    Patient stated that he did not do well on this medication. He felt like climbing the walls  . Morphine And Related Other (See Comments)    Patient stated that he did not do well on this medication. He felt like climbing the walls    Family history: family history includes Stroke in his maternal uncle and maternal uncle.  Prior to Admission medications   Medication Sig Start Date End Date Taking? Authorizing Provider  albuterol (PROVENTIL) (2.5 MG/3ML) 0.083% nebulizer solution USE ONE VIAL IN NEBULIZER EVERY 4 HOURS AS NEEDED FOR WHEEZING AND FOR SHORTNESS OF BREATH 04/13/17  Yes Leslye Peer, MD  amLODipine (NORVASC) 10 MG tablet Take 10 mg by mouth daily.   Yes [provider]  aspirin EC 325 MG EC tablet Take 1 tablet (325 mg total) by mouth daily. 08/09/16  Yes Seiler, Amber K, NP  citalopram (CELEXA) 10 MG tablet Take 10 mg by mouth daily. Reported on 06/06/2016   Yes [provider]  furosemide (LASIX) 20 MG tablet Take 3 tablets (60 mg total) by mouth daily. Patient taking differently: Take 60  mg by mouth every 3 (three) days.  05/18/16  Yes Seiler, Amber K, NP  ibuprofen (ADVIL,MOTRIN) 200 MG tablet Take 400 mg by mouth every 6 (six) hours as needed (For pain.).   Yes [provider]  lisinopril (PRINIVIL,ZESTRIL) 10 MG tablet Take 10 mg by mouth daily.  07/31/16  Yes [provider]  metoprolol succinate (TOPROL XL) 25 MG 24 hr tablet Take 1 tablet (25 mg total) by mouth daily. 08/07/16  Yes Lewayne Bunting, MD  Multiple Vitamin (MULITIVITAMIN WITH MINERALS) TABS Take 1 tablet by mouth daily.   Yes [provider]  niacin (NIASPAN) 500 MG CR tablet Take 1,000 mg by mouth 2 (two) times daily.    Yes [provider]  nitroGLYCERIN (NITROSTAT) 0.4 MG SL tablet Place 1 tablet (  0.4 mg total) under the tongue every 5 (five) minutes as needed for chest pain. 02/29/16  Yes Lewayne Bunting, MD  potassium chloride SA (K-DUR,KLOR-CON) 20 MEQ tablet TAKE 2 TABLETS THREE TIMES DAILY. PLEASE SCHEDULE APPOINTMENT FOR REFILLS. 09/18/16  Yes Lewayne Bunting, MD  pravastatin (PRAVACHOL) 80 MG tablet Take 1 tablet (80 mg total) by mouth daily. 12/18/16  Yes Lewayne Bunting, MD       Physical Exam: BP (!) 178/91 (BP Location: Right Arm)   Pulse 71   Temp 97 F (36.1 C) (Oral)   Resp 18   Ht 6\' 1"  (1.854 m)   Wt 81.6 kg (180 lb)   SpO2 97%   BMI 23.75 kg/m  General appearance: Well-developed, elderly adult male, alert and in mild distress from dyspnea.   Eyes: Anicteric, conjunctiva pink, lids and lashes normal. PERRL.    ENT: No nasal deformity, discharge, epistaxis.  Hearing normal. OP with dry MM without lesions.   Neck: No neck masses.  Trachea midline.  No thyromegaly/tenderness. Lymph: No cervical or supraclavicular lymphadenopathy. Skin: Warm and dry.  No jaundice.  No suspicious rashes or lesions. Cardiac: Tachycardic, nl S1-S2, no murmurs appreciated.  Capillary refill is brisk.  JVP hard to visualize given bounding carotid.  No LE edema.  Radial  pulses 2+ and symmetric. Respiratory: Tachypneic, speaks in short sentences, seems breathless with minimal exertion, diffuse expiratory wheezing.  No rales. Abdomen: Abdomen soft.  No TTP. No ascites, distension, hepatosplenomegaly.   MSK: No deformities or effusions.  No cyanosis or clubbing. Neuro: Cranial nerves normal.  Sensation intact to light touch. Speech is fluent.  Muscle strength normal.    Psych: Sensorium intact and responding to questions, attention normal.  Behavior appropriate.  Affect normal.  Judgment and insight appear normal.     Labs on Admission:  I have personally reviewed following labs and imaging studies: CBC:  Recent Labs Lab 05/25/17 0004  WBC 7.4  NEUTROABS 5.8  HGB 12.6*  HCT 40.7  MCV 87.0  PLT 162   Basic Metabolic Panel:  Recent Labs Lab 05/25/17 0004  NA 141  K 2.8*  CL 107  CO2 26  GLUCOSE 116*  BUN 19  CREATININE 1.32*  CALCIUM 8.5*   GFR: Estimated Creatinine Clearance: 55.5 mL/min (A) (by C-G formula based on SCr of 1.32 mg/dL (H)).  Liver Function Tests:  Recent Labs Lab 05/25/17 0004  AST 21  ALT 19  ALKPHOS 69  BILITOT 1.2  PROT 5.8*  ALBUMIN 3.4*        Radiological Exams on Admission: Personally reviewed CXR shows maybe increased interstitial markings, chronic, no overt edema, no pneuonia: Dg Chest 2 View  Result Date: 05/25/2017 CLINICAL DATA:  Shortness of breath EXAM: CHEST  2 VIEW COMPARISON:  03/12/2016 FINDINGS: Left-sided pacing device as before. Post sternotomy changes. Vascular occlusive device on lateral view. Cardiomegaly with central vascular congestion. No focal infiltrate or effusion. Aortic atherosclerosis. Tiny bilateral effusions. No pneumothorax. Vertebral augmentation changes at the upper lumbar spine. Increased interstitial opacity at the lung bases. IMPRESSION: 1. Cardiomegaly with mild central congestion. No edema or infiltrate. 2. Increased interstitial opacity at the lung bases could relate to  bronchial inflammation. Electronically Signed   By: Jasmine Pang M.D.   On: 05/25/2017 00:00    EKG: Independently reviewed. Rate 75, v Paced.    Echocardiogram 2017: Report reviewed EF 25-30%           Assessment/Plan  1. COPD exacerbation:  This seems to be the primary driver.   -Solu-Medrol 60 mg twice a day -Scheduled and when necessary bronchodilators -Doxycycline 100 mg twice a day -Check respiratory virus panel for infection control -Smoking cessation strongly recommended, modalities discussed, nursing teaching ordered   2. Acute on chronic systolic CHF:  EF 25%. Ischemic adenopathy. At home takes Lasix only when necessary. No significant fluid overload on exam, but BNP greater than 2500. -Furosemide 60 mg daily -Replete K -Strict ins and outs, daily weights  3. Hypokalemia:  -Check Mag -Replete K orally  4. Atrial fibrillation:  CHADs2Vasc 5.  Has pacer.  Has watchman. -Continue metoprolol  5. Hypertension and CV disease:  Hypertensive at admission. -Continue amlodipine -Continue metoprolol, ACEi -Continue aspirin 325, statin  6. Other medications:  -Continue citalopram         DVT prophylaxis: Lovenox  Code Status: FULL  Family Communication: None present  Disposition Plan: Anticipate IV steroids, bronchodilators, antibiotics for COPD flare. IV lasix for CHF flare, close monitroing of renal function and volume status. Consults called: None Admission status: INPATIENT    Medical decision making: Patient seen at 5:00 AM on 05/25/2017.  The patient was discussed with Dr. Dalene Seltzer.  What exists of the patient's chart was reviewed in depth and summarized above.  Clinical condition: hemodynamics improving, stable for tele floor.        Alberteen Sam [facility] Pager [phone]        At the time of admission, it appears that the appropriate admission status for this patient is INPATIENT. This is judged to be  reasonable and necessary in order to provide the required intensity of service to ensure the patient's safety given the presenting symptoms, physical exam findings, and initial radiographic and laboratory data in the context of their chronic comorbidities.  Together, these circumstances are felt to place him at high risk for further clinical deterioration threatening life, limb, or organ.   Patient requires inpatient status due to high intensity of service, high risk for further deterioration and high frequency of surveillance required because of this severe exacerbation of their chronic organ failure.  Factors support inpatient status include Presentation with tachypnea, breathlessness with minimal exertion, requirement for supplemental oxygen Acute on chronic COPD, requiring IV steroids and frequent neurologic bronchodilators Acute on chronic systolic CHF requiring IV diuresis, telemetry monitoring insetting of paroxysmal atrial fibrillation, ischemic cardiomyopathy with EF 25%, history of stroke and chronic kidney disease stage III  I certify that at the point of admission it is my clinical judgment that the patient will require inpatient hospital care spanning beyond 2 midnights from the point of admission and that early discharge would result in unnecessary risk of decompensation and readmission or threat to life, limb or bodily function.

## 2017-05-25 NOTE — Consult Note (Signed)
Cardiology Consult    Patient ID: ERSEL ENSLIN MRN: [id number], DOB/AGE: [birth year]   Admit date: 05/24/2017 Date of Consult: 05/25/2017  Primary Physician: [name], MD Reason for Consult: CHF Primary Cardiologist: Dr. [name] Electrophysiologist: Dr. [name] Requesting Provider: Dr. Susie Cassette   History of Present Illness    [name] [name] is a 77 y.o. male with past medical history of CAD (s/p CABG in 1998 with LIMA-LAD, SVG-D1 SVG-OM2-OM3 and SVG-RCA, patent grafts by cath in 2013), ischemic cardiomyopathy (s/p St.Jude CRTD placement in 2013), chronic combined systolic and diastolic CHF (EF 40-98% by echo in 07/2016), PAF (s/p LAA closure with WATCHMAN in 05/2016 with AVN ablation in 08/2016), COPD, AAA (s/p repair in 12/2015 - followed by Vascular), HTN, HLD, and continued tobacco use who is being seen today for the evaluation of CHF at the request of Dr. Susie Cassette.   Was last evaluated by Gypsy Balsam in 01/2017 and reported doing well from a cardiac perspective at that time. His remote transmission was received on 5/24 and showed normal thoracic impedence.   He presented to the ED on 05/24/2017 for worsening dyspnea over the past week. Reports he initially had orthopnea and was having to sleep in his recliner but over the past few days, his dyspnea progressed and was occurring at rest and with activity. No PND or lower extremity edema. Has experienced a dry cough. He denies any recent chest discomfort, palpitations, or presyncope. Reports weights have been stable on his home scales at approximately 180 lbs, up to 186 lbs today. Reports not consuming sodium and limiting his fluid intake.   Initial labs show WBC of 7.4, Hgb 12.6, platelets 162. K+ 2.8, creatinine 1.32. BNP 2353. Initial troponin negative. Mg 1.8. CXR shows cardiomegaly with mild central congestion with no edema or Infiltrate. Increased interstitial opacity at the lung bases was noted. EKG shows  V-pacing, HR  75.  He has been admitted with worsening dyspnea in the setting of a COPD exacerbation and acute on chronic systolic CHF. Has been started on Solu-medrol 60mg  BID and Doxycycline 100mg  BID. Was placed on IV Lasix 60mg  daily with a net output of -1.5L thus far (was on Lasix 60mg  every 3 days as an outpatient).   Past Medical History   Past Medical History:  Diagnosis Date  . Aortic dissection (HCC)    H/O focal aortic dissection  . CAD (coronary artery disease)    a. s/p CABG in 1998 with LIMA-LAD, SVG-D1, SVG-OM2-OM3, and SVG-RCA b. cath 10/07:  LM ok, LAD occluded, AV CFX 95%, pRCA occluded; L-LAD, S-OM2/OM3, S-PDA ok c. 2013: cath showing patent grafts  . Cerebrovascular disease    a. s/p prior Left CEA;  followed by Dr. [name]  . CHF (congestive heart failure) (HCC)    a. s/p STJ CRTD  . CKD (chronic kidney disease)   . COPD (chronic obstructive pulmonary disease) (HCC)   . Depression   . DVT (deep venous thrombosis) (HCC)   . History of stomach ulcers   . Hyperlipidemia   . Hypertension   . Iliac artery aneurysm, left (HCC)    followed by Dr. [name]  . Ischemic cardiomyopathy    a. s/p St. Jude CRTD placement in 2013  . LBBB (left bundle branch block)   . Myocardial infarction (HCC) 1997  . Nephrolithiasis   . PAF (paroxysmal atrial fibrillation) (HCC)    a. on amio and pradaxa. b. s/p AVN ablation  . Stroke [facility]) 12/2015 "several mini"  a. on coumadin --> switched to pradaxa b. s/p Watchman in 05/2016    Past Surgical History:  Procedure Laterality Date  . ABDOMINAL AORTIC ANEURYSM REPAIR N/A 01/03/2016   Procedure: ANEURYSM ABDOMINAL AORTA, BILATERAL COMMON ILIAC REPAIR;  Surgeon: [name], MD;  Location: [facility] OR;  Service: Vascular;  Laterality: N/A;  . ABDOMINAL AORTIC ANEURYSM REPAIR  01/03/2016  . APPENDECTOMY  09/11/11  . BACK SURGERY    . BI-VENTRICULAR IMPLANTABLE CARDIOVERTER DEFIBRILLATOR N/A 08/22/2012   SJM Quadra Assura BiV ICD implanted by Dr [name]  .  CARDIAC CATHETERIZATION  1998; 2013  . CARDIOVERSION N/A 07/26/2016   Procedure: CARDIOVERSION;  Surgeon: Lars Masson, MD;  Location: [facility] ENDOSCOPY;  Service: Cardiovascular;  Laterality: N/A;  . CAROTID ENDARTERECTOMY Left July  2005  . CORONARY ARTERY BYPASS GRAFT  1998   CABG X5  . CYSTOSCOPY W/ STONE MANIPULATION  X 2  . ELECTROPHYSIOLOGIC STUDY N/A 08/17/2016   AVN ablation by Dr [name]  . ESOPHAGOGASTRODUODENOSCOPY N/A 02/28/2016   Procedure: ESOPHAGOGASTRODUODENOSCOPY (EGD);  Surgeon: [name], MD;  Location: [facility] ENDOSCOPY;  Service: Endoscopy;  Laterality: N/A;  . LEFT ATRIAL APPENDAGE OCCLUSION N/A 06/01/2016   Watchman device implanted by Dr [name]  . POSTERIOR FUSION LUMBAR SPINE  Sep 27, 2014   L4-L5  . PR VEIN BYPASS GRAFT,AORTO-FEM-POP  01/03/2016  . TEE WITHOUT CARDIOVERSION N/A 07/26/2016   Procedure: TRANSESOPHAGEAL ECHOCARDIOGRAM (TEE);  Surgeon: Lars Masson, MD;  Location: [facility] ENDOSCOPY;  Service: Cardiovascular;  Laterality: N/A;     Allergies  Allergies  Allergen Reactions  . Buprenorphine Hcl Other (See Comments)    Patient stated that he did not do well on this medication. He felt like climbing the walls  . Morphine And Related Other (See Comments)    Patient stated that he did not do well on this medication. He felt like climbing the walls    Inpatient Medications    . amLODipine  10 mg Oral Daily  . aspirin  325 mg Oral Daily  . Chlorhexidine Gluconate Cloth  6 each Topical Q0600  . citalopram  10 mg Oral Daily  . doxycycline  100 mg Oral Q12H  . enoxaparin (LOVENOX) injection  40 mg Subcutaneous Q24H  . furosemide  60 mg Intravenous Q12H  . ipratropium-albuterol  3 mL Nebulization Q8H  . lisinopril  10 mg Oral Daily  . methylPREDNISolone (SOLU-MEDROL) injection  60 mg Intravenous Q12H  . metoprolol succinate  25 mg Oral Daily  . mometasone-formoterol  2 puff Inhalation BID  . mupirocin ointment  1 application Nasal BID  . potassium  chloride  60 mEq Oral TID  . pravastatin  80 mg Oral Daily    Family History    Family History  Problem Relation Age of Onset  . Stroke Maternal Uncle   . Stroke Maternal Uncle   . Heart attack Neg Hx   . Hypertension Neg Hx     Social History    Social History   Social History  . Marital status: Widowed    Spouse name: N/A  . Number of children: 3  . Years of education: 6 TH   Occupational History  . RETIRED---truck driver Retired   Social History Main Topics  . Smoking status: Current Some Day Smoker    Packs/day: 0.50    Years: 48.00    Types: Cigarettes    Last attempt to quit: 12/11/2004  . Smokeless tobacco: Never Used     Comment: stated "  I'm trying to quit  . Alcohol use 0.0 oz/week     Comment: 06/01/2016 "haven't had a drink in years"  . Drug use: No  . Sexual activity: Yes   Other Topics Concern  . Not on file   Social History Narrative   Patient is married with 3 children.   Patient is right handed.   Patient has 10 th grade education.   Patient drinks tea occasionally.     Review of Systems    General:  No chills, fever, night sweats or weight changes.  Cardiovascular:  No chest pain, edema, palpitations, paroxysmal nocturnal dyspnea. Positive for dyspnea on exertion and orthopnea.  Dermatological: No rash, lesions/masses Respiratory: Positive for dry cough and dyspnea. Urologic: No hematuria, dysuria Abdominal:   No nausea, vomiting, diarrhea, bright red blood per rectum, melena, or hematemesis Neurologic:  No visual changes, wkns, changes in mental status. All other systems reviewed and are otherwise negative except as noted above.  Physical Exam    Blood pressure (!) 160/71, pulse 75, temperature 98 F (36.7 C), temperature source Oral, resp. rate 18, height 6\' 1"  (1.854 m), weight 186 lb 6.4 oz (84.6 kg), SpO2 98 %.  General: Pleasant, elderly Caucasian male appearing in NAD Psych: Normal affect. Neuro: Alert and oriented X 3. Moves  all extremities spontaneously. HEENT: Normal  Neck: Supple without bruits. JVD at 9cm. Lungs:  Resp regular and unlabored, rhonchi throughout lung fields with expiratory wheezing noted. Heart: RRR no s3, s4, or murmurs. Abdomen: Soft, non-tender, non-distended, BS + x 4.  Extremities: No clubbing, cyanosis or lower extremity edema. DP/PT/Radials 2+ and equal bilaterally.  Labs    Troponin [facility] Test)  Recent Labs  05/25/17 0017  TROPIPOC 0.01   No results for input(s): CKTOTAL, CKMB, TROPONINI in the last 72 hours. Lab Results  Component Value Date   WBC 7.4 05/25/2017   HGB 12.6 (L) 05/25/2017   HCT 40.7 05/25/2017   MCV 87.0 05/25/2017   PLT 162 05/25/2017     Recent Labs Lab 05/25/17 0917  NA 142  K 2.8*  CL 105  CO2 24  BUN 20  CREATININE 1.41*  CALCIUM 8.5*  PROT 6.1*  BILITOT 0.9  ALKPHOS 75  ALT 20  AST 28  GLUCOSE 270*   Lab Results  Component Value Date   CHOL 144 12/24/2014   HDL 38 (L) 12/24/2014   LDLCALC 75 12/24/2014   TRIG 154 (H) 12/24/2014   Lab Results  Component Value Date   DDIMER <0.22 01/25/2012     Radiology Studies    Dg Chest 2 View  Result Date: 05/25/2017 CLINICAL DATA:  Shortness of breath EXAM: CHEST  2 VIEW COMPARISON:  03/12/2016 FINDINGS: Left-sided pacing device as before. Post sternotomy changes. Vascular occlusive device on lateral view. Cardiomegaly with central vascular congestion. No focal infiltrate or effusion. Aortic atherosclerosis. Tiny bilateral effusions. No pneumothorax. Vertebral augmentation changes at the upper lumbar spine. Increased interstitial opacity at the lung bases. IMPRESSION: 1. Cardiomegaly with mild central congestion. No edema or infiltrate. 2. Increased interstitial opacity at the lung bases could relate to bronchial inflammation. Electronically Signed   By: Jasmine Pang M.D.   On: 05/25/2017 00:00    EKG & Cardiac Imaging    EKG:  V-paced, HR 75. - Personally  Reviewed  Echocardiogram: 07/26/2016 Study Conclusions  - Left ventricle: Systolic function was severely reduced. The   estimated ejection fraction was in the range of 25% to 30%.  Diffuse hypokinesis. - Aortic valve: There was trivial regurgitation. - Aorta: Descending aorta has mild plaque. - Aortic arch: The aortic arch was normal in size. - Mitral valve: There was mild to moderate regurgitation. - Left atrium: The atrium was dilated. No evidence of thrombus in   the atrial cavity or appendage. - Right ventricle: The cavity size was normal. Wall thickness was   normal. Pacer wire or catheter noted in right ventricle. Systolic   function was mildly reduced. - Right atrium: Pacer wire or catheter noted in right atrium. No   evidence of thrombus in the atrial cavity or appendage. - Atrial septum: There is a small iatrogenic ASD with left to right   flow. - Tricuspid valve: There was mild regurgitation.  Impressions:  - This was a follow up TEE post Watchman device implantation and   also a TEE prior to a cardioversion.  Assessment & Plan    1. Acute on Chronic Combined Systolic and Diastolic CHF - known ischemic cardiomyopathy with EF at 25-30% by TEE in 07/2016 - he presents with worsening orthopnea, dyspnea, and a dry cough over the past week. Denies any chest pain, palpitations, PND, lower extremity edema, or presyncope. - Reports weights have been stable on his home scales at approximately 180 lbs, but he is up to 186 lbs on the scales today - BNP elevated to 2353. CXR shows cardiomegaly with mild central congestion with no edema or Infiltrate. - he has been placed on IV Lasix 60mg  daily with a net output of -1.5L thus far (was on Lasix 60mg  every 3 days as an outpatient). Can increase to 40mg  BID if output lessens and creatinine remains stable.   2. CAD - s/p CABG in 1998 with LIMA-LAD, SVG-D1 SVG-OM2-OM3 and SVG-RCA, patent grafts by cath in 2013. - he denies any  recent chest pain. Initial troponin is negative and EKG is without acute changes.  - continue ASA, statin, and BB therapy (currently on Toprol-XL, with his active wheezing consider switching to Bisoprolol.   3. Ischemic cardiomyopathy - s/p St.Jude CRTD placement in 2013. - followed by Dr. [name] as an outpatient.   4. Persistent Atrial Fibrillation - s/p LAA closure with WATCHMAN in 05/2016. Remains on ASA 325mg  daily.  - underwent AVN ablation in 08/2016 with remote transmission from 04/2017 showing > 99% AT/AF burden. - continue BB therapy.  5. HTN - BP elevated to 160/71 on most recent check. - remains on PTA Amlodipine 10mg  daily, Lisinopril 10mg  daily,and Toprol-XL 25mg  daily.   6. Hypokalemia - K+ at 2.8 this AM.   - has received 80 mEq thus far. Plan for repeat BMET in AM.   7. COPD - remains on Solu-medrol and Doxycycline along with scheduled nebs.  - per admitting team  8. Stage 3 CKD - creatinine at 1.41, close to baseline.  - recheck BMET in the AM.  9. Tobacco Use - he is still smoking 1 ppd.  - the importance of cessation was reviewed with the patient.    Signed, Ellsworth Lennox, PA-C 05/25/2017, 1:48 PM Pager: [phone]

## 2017-05-26 ENCOUNTER — Inpatient Hospital Stay (HOSPITAL_COMMUNITY): Payer: Medicare Other

## 2017-05-26 DIAGNOSIS — I360 Nonrheumatic tricuspid (valve) stenosis: Secondary | ICD-10-CM

## 2017-05-26 DIAGNOSIS — I255 Ischemic cardiomyopathy: Secondary | ICD-10-CM

## 2017-05-26 LAB — BASIC METABOLIC PANEL
Anion gap: 9 (ref 5–15)
BUN: 28 mg/dL — ABNORMAL HIGH (ref 6–20)
CO2: 30 mmol/L (ref 22–32)
CREATININE: 1.55 mg/dL — AB (ref 0.61–1.24)
Calcium: 8.4 mg/dL — ABNORMAL LOW (ref 8.9–10.3)
Chloride: 106 mmol/L (ref 101–111)
GFR, EST AFRICAN AMERICAN: 49 mL/min — AB (ref >60)
GFR, EST NON AFRICAN AMERICAN: 42 mL/min — AB (ref >60)
Glucose, Bld: 136 mg/dL — ABNORMAL HIGH (ref 65–99)
POTASSIUM: 3.9 mmol/L (ref 3.5–5.1)
SODIUM: 145 mmol/L (ref 135–145)

## 2017-05-26 LAB — CBC
HCT: 41.9 % (ref 39.0–52.0)
HEMOGLOBIN: 12.9 g/dL — AB (ref 13.0–17.0)
MCH: 26.8 pg (ref 26.0–34.0)
MCHC: 30.8 g/dL (ref 30.0–36.0)
MCV: 86.9 fL (ref 78.0–100.0)
PLATELETS: 184 10*3/uL (ref 150–400)
RBC: 4.82 MIL/uL (ref 4.22–5.81)
RDW: 17.6 % — ABNORMAL HIGH (ref 11.5–15.5)
WBC: 10.5 10*3/uL (ref 4.0–10.5)

## 2017-05-26 LAB — ECHOCARDIOGRAM COMPLETE
HEIGHTINCHES: 73 in
WEIGHTICAEL: 2921.6 [oz_av]

## 2017-05-26 MED ORDER — FUROSEMIDE 10 MG/ML IJ SOLN: 60.0000 mg | Freq: Three times a day (TID) | INTRAMUSCULAR | Status: DC

## 2017-05-26 MED ORDER — FUROSEMIDE 10 MG/ML IJ SOLN
60.0000 mg | Freq: Two times a day (BID) | INTRAMUSCULAR | Status: DC
  Administered 2017-05-26 – 2017-05-27 (×2): 60 mg via INTRAVENOUS
  Filled 2017-05-26 (×2): qty 6

## 2017-05-26 MED ORDER — GUAIFENESIN-CODEINE 100-10 MG/5ML PO SOLN
5.0000 mL | Freq: Two times a day (BID) | ORAL | Status: DC
  Administered 2017-05-26 – 2017-05-29 (×7): 5 mL via ORAL
  Filled 2017-05-26 (×8): qty 5

## 2017-05-26 NOTE — Progress Notes (Signed)
  Echocardiogram 2D Echocardiogram has been performed.  [name] [name] 05/26/2017, 9:03 AM

## 2017-05-26 NOTE — Progress Notes (Addendum)
DAILY PROGRESS NOTE   Patient Name: [name] [name] Date of Encounter: 05/26/2017  Hospital Problem List   Principal Problem:   COPD exacerbation [facility]) Active Problems:   CAD- CABG '98- cath 2007 and Aug 2013- Med Rx   Cardiomyopathy, ischemic-EF 25%    Permanent atrial fibrillation (HCC)   Automatic implantable cardioverter-defibrillator in situ   CKD (chronic kidney disease), stage III   Tachycardia-bradycardia syndrome (HCC)   Acute on chronic combined systolic and diastolic CHF (congestive heart failure) (HCC)   Hypokalemia    Chief Complaint   Breathing minimally better today  Subjective   Very little negative last night - total 1.7L negative. Creatinine rising (up to 1.55 today). BNP was 2,353. I personally reviewed his echo today - LVEF is lower at 20-25%, there is now severe MR and severe biatrial enlargement with elevated LV filling pressures.   Objective     Vitals:   05/25/17 2029 05/25/17 2033 05/26/17 0505 05/26/17 0911  BP:   (!) 156/84 (!) 161/93  Pulse:   78 79  Resp:   20   Temp:   97.8 F (36.6 C)   TempSrc:   Oral   SpO2: 93% 93% 95% 96%  Weight:   182 lb 9.6 oz (82.8 kg)   Height:        Intake/Output Summary (Last 24 hours) at 05/26/17 1121 Last data filed at 05/26/17 0731  Gross per 24 hour  Intake              800 ml  Output             1650 ml  Net             -850 ml   Filed Weights   05/24/17 2313 05/25/17 0542 05/26/17 0505  Weight: 180 lb (81.6 kg) 186 lb 6.4 oz (84.6 kg) 182 lb 9.6 oz (82.8 kg)    Physical Exam   General appearance: alert and no distress Neck: JVD - 3 cm above sternal notch and no carotid bruit Lungs: diminished breath sounds bilaterally and rales bibasilar Heart: regular rate and rhythm Extremities: extremities normal, atraumatic, no cyanosis or edema Neurologic: Grossly normal Psych: Pleasant  Inpatient Medications    Scheduled Meds: . amLODipine  10 mg Oral Daily  . aspirin  325 mg Oral Daily    . Chlorhexidine Gluconate Cloth  6 each Topical Q0600  . citalopram  10 mg Oral Daily  . doxycycline  100 mg Oral Q12H  . enoxaparin (LOVENOX) injection  40 mg Subcutaneous Q24H  . furosemide  60 mg Intravenous Q12H  . guaiFENesin-codeine  5 mL Oral Q12H  . ipratropium-albuterol  3 mL Nebulization TID  . lisinopril  10 mg Oral Daily  . methylPREDNISolone (SOLU-MEDROL) injection  60 mg Intravenous Q12H  . metoprolol succinate  25 mg Oral Daily  . mometasone-formoterol  2 puff Inhalation BID  . mupirocin ointment  1 application Nasal BID  . pravastatin  80 mg Oral Daily    Continuous Infusions:   PRN Meds: acetaminophen **OR** acetaminophen, albuterol   Labs   Results for orders placed or performed during the hospital encounter of 05/24/17 (from the past 48 hour(s))  CBC with Differential     Status: Abnormal   Collection Time: 05/25/17 12:04 AM  Result Value Ref Range   WBC 7.4 4.0 - 10.5 K/uL   RBC 4.68 4.22 - 5.81 MIL/uL   Hemoglobin 12.6 (L) 13.0 - 17.0 g/dL   HCT  40.7 39.0 - 52.0 %   MCV 87.0 78.0 - 100.0 fL   MCH 26.9 26.0 - 34.0 pg   MCHC 31.0 30.0 - 36.0 g/dL   RDW 17.5 (H) 11.5 - 15.5 %   Platelets 162 150 - 400 K/uL   Neutrophils Relative % 78 %   Neutro Abs 5.8 1.7 - 7.7 K/uL   Lymphocytes Relative 10 %   Lymphs Abs 0.7 0.7 - 4.0 K/uL   Monocytes Relative 7 %   Monocytes Absolute 0.5 0.1 - 1.0 K/uL   Eosinophils Relative 4 %   Eosinophils Absolute 0.3 0.0 - 0.7 K/uL   Basophils Relative 1 %   Basophils Absolute 0.1 0.0 - 0.1 K/uL  Comprehensive metabolic panel     Status: Abnormal   Collection Time: 05/25/17 12:04 AM  Result Value Ref Range   Sodium 141 135 - 145 mmol/L   Potassium 2.8 (L) 3.5 - 5.1 mmol/L   Chloride 107 101 - 111 mmol/L   CO2 26 22 - 32 mmol/L   Glucose, Bld 116 (H) 65 - 99 mg/dL   BUN 19 6 - 20 mg/dL   Creatinine, Ser 1.32 (H) 0.61 - 1.24 mg/dL   Calcium 8.5 (L) 8.9 - 10.3 mg/dL   Total Protein 5.8 (L) 6.5 - 8.1 g/dL   Albumin  3.4 (L) 3.5 - 5.0 g/dL   AST 21 15 - 41 U/L   ALT 19 17 - 63 U/L   Alkaline Phosphatase 69 38 - 126 U/L   Total Bilirubin 1.2 0.3 - 1.2 mg/dL   GFR calc non Af Amer 51 (L) >60 mL/min   GFR calc Af Amer 60 (L) >60 mL/min    Comment: (NOTE) The eGFR has been calculated using the CKD EPI equation. This calculation has not been validated in all clinical situations. eGFR's persistently <60 mL/min signify possible Chronic Kidney Disease.    Anion gap 8 5 - 15  Brain natriuretic peptide     Status: Abnormal   Collection Time: 05/25/17 12:04 AM  Result Value Ref Range   B Natriuretic Peptide 2,353.9 (H) 0.0 - 100.0 pg/mL  I-stat troponin, ED     Status: None   Collection Time: 05/25/17 12:17 AM  Result Value Ref Range   Troponin i, poc 0.01 0.00 - 0.08 ng/mL   Comment 3            Comment: Due to the release kinetics of cTnI, a negative result within the first hours of the onset of symptoms does not rule out myocardial infarction with certainty. If myocardial infarction is still suspected, repeat the test at appropriate intervals.   Respiratory Panel by PCR     Status: None   Collection Time: 05/25/17  5:18 AM  Result Value Ref Range   Adenovirus NOT DETECTED NOT DETECTED   Coronavirus 229E NOT DETECTED NOT DETECTED   Coronavirus HKU1 NOT DETECTED NOT DETECTED   Coronavirus NL63 NOT DETECTED NOT DETECTED   Coronavirus OC43 NOT DETECTED NOT DETECTED   Metapneumovirus NOT DETECTED NOT DETECTED   Rhinovirus / Enterovirus NOT DETECTED NOT DETECTED   Influenza A NOT DETECTED NOT DETECTED   Influenza B NOT DETECTED NOT DETECTED   Parainfluenza Virus 1 NOT DETECTED NOT DETECTED   Parainfluenza Virus 2 NOT DETECTED NOT DETECTED   Parainfluenza Virus 3 NOT DETECTED NOT DETECTED   Parainfluenza Virus 4 NOT DETECTED NOT DETECTED   Respiratory Syncytial Virus NOT DETECTED NOT DETECTED   Bordetella pertussis NOT DETECTED NOT  DETECTED   Chlamydophila pneumoniae NOT DETECTED NOT DETECTED    Mycoplasma pneumoniae NOT DETECTED NOT DETECTED  MRSA PCR Screening     Status: Abnormal   Collection Time: 05/25/17  6:05 AM  Result Value Ref Range   MRSA by PCR POSITIVE (A) NEGATIVE    Comment:        The GeneXpert MRSA Assay (FDA approved for NASAL specimens only), is one component of a comprehensive MRSA colonization surveillance program. It is not intended to diagnose MRSA infection nor to guide or monitor treatment for MRSA infections. RESULT CALLED TO, READ BACK BY AND VERIFIED WITH: L MUELLER 05/25/17 @ 0839 M VESTAL   Magnesium     Status: None   Collection Time: 05/25/17  8:29 AM  Result Value Ref Range   Magnesium 1.7 1.7 - 2.4 mg/dL  Comprehensive metabolic panel     Status: Abnormal   Collection Time: 05/25/17  9:17 AM  Result Value Ref Range   Sodium 142 135 - 145 mmol/L   Potassium 2.8 (L) 3.5 - 5.1 mmol/L   Chloride 105 101 - 111 mmol/L   CO2 24 22 - 32 mmol/L   Glucose, Bld 270 (H) 65 - 99 mg/dL   BUN 20 6 - 20 mg/dL   Creatinine, Ser 1.41 (H) 0.61 - 1.24 mg/dL   Calcium 8.5 (L) 8.9 - 10.3 mg/dL   Total Protein 6.1 (L) 6.5 - 8.1 g/dL   Albumin 3.4 (L) 3.5 - 5.0 g/dL   AST 28 15 - 41 U/L   ALT 20 17 - 63 U/L   Alkaline Phosphatase 75 38 - 126 U/L   Total Bilirubin 0.9 0.3 - 1.2 mg/dL   GFR calc non Af Amer 48 (L) >60 mL/min   GFR calc Af Amer 55 (L) >60 mL/min    Comment: (NOTE) The eGFR has been calculated using the CKD EPI equation. This calculation has not been validated in all clinical situations. eGFR's persistently <60 mL/min signify possible Chronic Kidney Disease.    Anion gap 13 5 - 15  Magnesium     Status: None   Collection Time: 05/25/17  9:17 AM  Result Value Ref Range   Magnesium 1.8 1.7 - 2.4 mg/dL  CBC     Status: Abnormal   Collection Time: 05/26/17  3:02 AM  Result Value Ref Range   WBC 10.5 4.0 - 10.5 K/uL   RBC 4.82 4.22 - 5.81 MIL/uL   Hemoglobin 12.9 (L) 13.0 - 17.0 g/dL   HCT 41.9 39.0 - 52.0 %   MCV 86.9 78.0 - 100.0  fL   MCH 26.8 26.0 - 34.0 pg   MCHC 30.8 30.0 - 36.0 g/dL   RDW 17.6 (H) 11.5 - 15.5 %   Platelets 184 150 - 400 K/uL  Basic metabolic panel     Status: Abnormal   Collection Time: 05/26/17  3:02 AM  Result Value Ref Range   Sodium 145 135 - 145 mmol/L   Potassium 3.9 3.5 - 5.1 mmol/L    Comment: DELTA CHECK NOTED   Chloride 106 101 - 111 mmol/L   CO2 30 22 - 32 mmol/L   Glucose, Bld 136 (H) 65 - 99 mg/dL   BUN 28 (H) 6 - 20 mg/dL   Creatinine, Ser 1.55 (H) 0.61 - 1.24 mg/dL   Calcium 8.4 (L) 8.9 - 10.3 mg/dL   GFR calc non Af Amer 42 (L) >60 mL/min   GFR calc Af Amer 49 (L) >60 mL/min  Comment: (NOTE) The eGFR has been calculated using the CKD EPI equation. This calculation has not been validated in all clinical situations. eGFR's persistently <60 mL/min signify possible Chronic Kidney Disease.    Anion gap 9 5 - 15    ECG   N/A  Telemetry   Paced rhythm - Personally Reviewed  Radiology    Dg Chest 2 View  Result Date: 05/25/2017 CLINICAL DATA:  Shortness of breath EXAM: CHEST  2 VIEW COMPARISON:  03/12/2016 FINDINGS: Left-sided pacing device as before. Post sternotomy changes. Vascular occlusive device on lateral view. Cardiomegaly with central vascular congestion. No focal infiltrate or effusion. Aortic atherosclerosis. Tiny bilateral effusions. No pneumothorax. Vertebral augmentation changes at the upper lumbar spine. Increased interstitial opacity at the lung bases. IMPRESSION: 1. Cardiomegaly with mild central congestion. No edema or infiltrate. 2. Increased interstitial opacity at the lung bases could relate to bronchial inflammation. Electronically Signed   By: Donavan Foil M.D.   On: 05/25/2017 00:00    Cardiac Studies   LV EF: 20% -   25%  ------------------------------------------------------------------- Indications:      CHF - 428.0.  ------------------------------------------------------------------- History:   PMH:  Ischemic cardiomyopathy. CKD.  Aortic aneurysm repair.  Coronary artery disease.  Mitral valve disease.  Chronic obstructive pulmonary disease.  Risk factors:  Dyslipidemia.  ------------------------------------------------------------------- Study Conclusions  - Left ventricle: The cavity size was mildly dilated. Wall   thickness was increased in a pattern of moderate LVH. Systolic   function was severely reduced. The estimated ejection fraction   was in the range of 20% to 25%. Diffuse hypokinesis. The study is   not technically sufficient to allow evaluation of LV diastolic   function. LV filling pressure is elevated. - Aortic valve: Sclerosis without stenosis. There was trivial   regurgitation. - Aorta: Dilated aortic root. Aortic root dimension: 47 mm (ED). - Mitral valve: Severe, mostly posteriorly directed MR - may be   functional. - Left atrium: Severely dilated. - Right ventricle: The cavity size was mildly dilated. Moderately   reduced systolic function. - Right atrium: Severely dilated. - Tricuspid valve: There was moderate regurgitation. - Pulmonary arteries: PA peak pressure: 52 mm Hg (S). - Inferior vena cava: The vessel was dilated. The respirophasic   diameter changes were blunted (< 50%), consistent with elevated   central venous pressure.  Impressions:  - Compared to a prior echo in 2017, the LVEF is lower at 20-25% -   there is now severe MR and severe biatrial enlargement.  Assessment   1. Principal Problem: 2.   COPD exacerbation ([place]) 3. Active Problems: 4.   CAD- CABG '98- cath 2007 and Aug 2013- Med Rx 5.   Cardiomyopathy, ischemic-EF 25%  6.   Permanent atrial fibrillation ([place]) 7.   Automatic implantable cardioverter-defibrillator in situ 8.   CKD (chronic kidney disease), stage III 9.   Tachycardia-bradycardia syndrome ([place]) 10.   Acute on chronic combined systolic and diastolic CHF (congestive heart failure) ([place]) 11.   Hypokalemia 12.   Plan   Mr. Lippman reports  minimal improvement in breathing - some diuresis overall, but creatinine rising - EF lower at 20-25% (was 25-30%) with moderate RV dysfunction as well - there is severe MR and severe biatrial enlargement. Still volume overloaded - may need inotropic therapy. Will d/w Dr. Haroldine Laws of the advanced CHF service. Ultimately, ?if it would be worthwhile to purse MitraClip given his severe MR - this seems to be functional given his severely dilated LA.  Time Spent Directly with Patient:  I have spent a total of 25 minutes with the patient reviewing hospital notes, telemetry, EKGs, labs and examining the patient as well as establishing an assessment and plan that was discussed personally with the patient. > 50% of time was spent in direct patient care.  Length of Stay:  LOS: 1 day   Pixie Casino, MD, [place]  Attending Cardiologist  Direct Dial: [phone]  Fax: [phone]  Website:  [email or web address] [place] 05/26/2017, 11:21 AM

## 2017-05-26 NOTE — Progress Notes (Signed)
Patient without complaint 7 a to 7 p shift, remains on room air. Continues to diurese.  Family at bedside

## 2017-05-26 NOTE — Plan of Care (Signed)
Problem: Physical Regulation: Goal: Ability to maintain clinical measurements within normal limits will improve Outcome: Progressing VSS  Problem: Tissue Perfusion: Goal: Risk factors for ineffective tissue perfusion will decrease Outcome: Progressing Patient maintains oxygen saturation in the 90's on room air   Problem: Activity: Goal: Risk for activity intolerance will decrease Outcome: Progressing Walks in hall without increased dyspnea   Problem: Fluid Volume: Goal: Ability to maintain a balanced intake and output will improve Outcome: Progressing Continues to diurese

## 2017-05-26 NOTE — Progress Notes (Signed)
[facility] PROGRESS NOTE  [name] [name] WJX:[id number] DOB: 11-19-1941 DOA: 05/24/2017   PCP: [name], MD     Assessment/Plan: Principal Problem:   COPD exacerbation [facility]) Active Problems:   CAD- CABG '98- cath 2007 and Aug 2013- Med Rx   Cardiomyopathy, ischemic-EF 25%    Permanent atrial fibrillation (HCC)   Automatic implantable cardioverter-defibrillator in situ   CKD (chronic kidney disease), stage III   Tachycardia-bradycardia syndrome (HCC)   Acute on chronic combined systolic and diastolic CHF (congestive heart failure) (HCC)   Hypokalemia   [name] [name] a 75 y.o.malewith a past medical history significant for CHF EF 25% with ICD/pacer, COPD not on home O2, CKD III, CAD s/p remote CABG, recent aorta repair, pAF with watchman, and remote CVAwho presents with dyspnea, wheezing and orthopnea for one week. Admitted for, and COPD/CHF exacerbation. Cardiology consulted  Assessment and plan   Acute COPD exacerbation:  Continue current treatment, weaned off oxygen  -Solu-Medrol 60 mg twice a day -Scheduled and when necessary bronchodilators -Doxycycline 100 mg twice a day -Respiratory panel negative -Patient seems to be clinically improved    Acute on chronic systolic CHF: LVEF is lower at 20-25% now with severe MR and severe biatrial enlargement with elevated LV filling pressures  At home takes Lasix only when necessary. No significant fluid overload on exam, BNP 2353. Chest x-ray shows mild central congestion -Continue Lasix 60 mg IV every 12 - Creatinine slightly up since admission, 1.32>1.55, -Strict ins and outs, daily weights May need inotropes ,  Dr. [name] of the advanced CHF ,to see patient    Coronary artery disease Status post CABG Continue aspirin, statin, beta blocker, Toprol-XL   s/p [employer] CRTD placement in 2013. followed by Dr. [name] as an outpatient   Hypokalemia:    Mag 1.8  -Repleted  Stage III CK  D Baseline around 1.3, creatinine currently 1.55   Atrial fibrillation:  CHADs2Vasc 5.  Has pacer.   status post LAA closure with watchman 6/17, on aspirin 325 mg daily -Continue metoprolol    Hypertension and CV disease:  Hypertensive at admission. Continue Norvasc, lisinopril, Toprol-XL    Other medications:  -Continue citalopram    DVT prophylaxsis Lovenox  Code Status:  Full code   Family Communication: Discussed in detail with the patient, all imaging results, lab results explained to the patient   Disposition Plan:  Pending cardiology recommendations       Consultants:  Cardiology  Procedures:  None  Antibiotics: Anti-infectives    Start     Dose/Rate Route Frequency Ordered Stop   05/25/17 1000  doxycycline (VIBRA-TABS) tablet 100 mg     100 mg Oral Every 12 hours 05/25/17 0557           HPI/Subjective: Off oxygen , states he urinated a lot yesterday, still wheezing and has a slight cough  Objective: Vitals:   05/25/17 1949 05/25/17 2029 05/25/17 2033 05/26/17 0505  BP: (!) 157/83   (!) 156/84  Pulse: 80   78  Resp: 18   20  Temp: 97.9 F (36.6 C)   97.8 F (36.6 C)  TempSrc: Oral   Oral  SpO2: 95% 93% 93% 95%  Weight:    82.8 kg (182 lb 9.6 oz)  Height:        Intake/Output Summary (Last 24 hours) at 05/26/17 0832 Last data filed at 05/26/17 0731  Gross per 24 hour  Intake  1220 ml  Output             2000 ml  Net             -780 ml    Exam:  Examination:  General exam: Appears calm and comfortable  Respiratory system: wheezing . Respiratory effort normal. Cardiovascular system: S1 & S2 heard, RRR. No JVD, murmurs, rubs, gallops or clicks. No pedal edema. Gastrointestinal system: Abdomen is nondistended, soft and nontender. No organomegaly or masses felt. Normal bowel sounds heard. Central nervous system: Alert and oriented. No focal neurological deficits. Extremities: Symmetric 5 x 5 power. Skin: No rashes,  lesions or ulcers Psychiatry: Judgement and insight appear normal. Mood & affect appropriate.     Data Reviewed: I have personally reviewed following labs and imaging studies  Micro Results Recent Results (from the past 240 hour(s))  Respiratory Panel by PCR     Status: None   Collection Time: 05/25/17  5:18 AM  Result Value Ref Range Status   Adenovirus NOT DETECTED NOT DETECTED Final   Coronavirus 229E NOT DETECTED NOT DETECTED Final   Coronavirus HKU1 NOT DETECTED NOT DETECTED Final   Coronavirus NL63 NOT DETECTED NOT DETECTED Final   Coronavirus OC43 NOT DETECTED NOT DETECTED Final   Metapneumovirus NOT DETECTED NOT DETECTED Final   Rhinovirus / Enterovirus NOT DETECTED NOT DETECTED Final   Influenza A NOT DETECTED NOT DETECTED Final   Influenza B NOT DETECTED NOT DETECTED Final   Parainfluenza Virus 1 NOT DETECTED NOT DETECTED Final   Parainfluenza Virus 2 NOT DETECTED NOT DETECTED Final   Parainfluenza Virus 3 NOT DETECTED NOT DETECTED Final   Parainfluenza Virus 4 NOT DETECTED NOT DETECTED Final   Respiratory Syncytial Virus NOT DETECTED NOT DETECTED Final   Bordetella pertussis NOT DETECTED NOT DETECTED Final   Chlamydophila pneumoniae NOT DETECTED NOT DETECTED Final   Mycoplasma pneumoniae NOT DETECTED NOT DETECTED Final  MRSA PCR Screening     Status: Abnormal   Collection Time: 05/25/17  6:05 AM  Result Value Ref Range Status   MRSA by PCR POSITIVE (A) NEGATIVE Final    Comment:        The GeneXpert MRSA Assay (FDA approved for NASAL specimens only), is one component of a comprehensive MRSA colonization surveillance program. It is not intended to diagnose MRSA infection nor to guide or monitor treatment for MRSA infections. RESULT CALLED TO, READ BACK BY AND VERIFIED WITH: L MUELLER 05/25/17 @ 0839 Annye Asa     Radiology Reports Dg Chest 2 View  Result Date: 05/25/2017 CLINICAL DATA:  Shortness of breath EXAM: CHEST  2 VIEW COMPARISON:  03/12/2016  FINDINGS: Left-sided pacing device as before. Post sternotomy changes. Vascular occlusive device on lateral view. Cardiomegaly with central vascular congestion. No focal infiltrate or effusion. Aortic atherosclerosis. Tiny bilateral effusions. No pneumothorax. Vertebral augmentation changes at the upper lumbar spine. Increased interstitial opacity at the lung bases. IMPRESSION: 1. Cardiomegaly with mild central congestion. No edema or infiltrate. 2. Increased interstitial opacity at the lung bases could relate to bronchial inflammation. Electronically Signed   By: Jasmine Pang M.D.   On: 05/25/2017 00:00     CBC  Recent Labs Lab 05/25/17 0004 05/26/17 0302  WBC 7.4 10.5  HGB 12.6* 12.9*  HCT 40.7 41.9  PLT 162 184  MCV 87.0 86.9  MCH 26.9 26.8  MCHC 31.0 30.8  RDW 17.5* 17.6*  LYMPHSABS 0.7  --   MONOABS 0.5  --   EOSABS 0.3  --  BASOSABS 0.1  --     Chemistries   Recent Labs Lab 05/25/17 0004 05/25/17 0829 05/25/17 0917 05/26/17 0302  NA 141  --  142 145  K 2.8*  --  2.8* 3.9  CL 107  --  105 106  CO2 26  --  24 30  GLUCOSE 116*  --  270* 136*  BUN 19  --  20 28*  CREATININE 1.32*  --  1.41* 1.55*  CALCIUM 8.5*  --  8.5* 8.4*  MG  --  1.7 1.8  --   AST 21  --  28  --   ALT 19  --  20  --   ALKPHOS 69  --  75  --   BILITOT 1.2  --  0.9  --    ------------------------------------------------------------------------------------------------------------------ estimated creatinine clearance is 47.3 mL/min (A) (by C-G formula based on SCr of 1.55 mg/dL (H)). ------------------------------------------------------------------------------------------------------------------ No results for input(s): HGBA1C in the last 72 hours. ------------------------------------------------------------------------------------------------------------------ No results for input(s): CHOL, HDL, LDLCALC, TRIG, CHOLHDL, LDLDIRECT in the last 72  hours. ------------------------------------------------------------------------------------------------------------------ No results for input(s): TSH, T4TOTAL, T3FREE, THYROIDAB in the last 72 hours.  Invalid input(s): FREET3 ------------------------------------------------------------------------------------------------------------------ No results for input(s): VITAMINB12, FOLATE, FERRITIN, TIBC, IRON, RETICCTPCT in the last 72 hours.  Coagulation profile No results for input(s): INR, PROTIME in the last 168 hours.  No results for input(s): DDIMER in the last 72 hours.  Cardiac Enzymes No results for input(s): CKMB, TROPONINI, MYOGLOBIN in the last 168 hours.  Invalid input(s): CK ------------------------------------------------------------------------------------------------------------------ Invalid input(s): POCBNP   CBG: No results for input(s): GLUCAP in the last 168 hours.     Studies: Dg Chest 2 View  Result Date: 05/25/2017 CLINICAL DATA:  Shortness of breath EXAM: CHEST  2 VIEW COMPARISON:  03/12/2016 FINDINGS: Left-sided pacing device as before. Post sternotomy changes. Vascular occlusive device on lateral view. Cardiomegaly with central vascular congestion. No focal infiltrate or effusion. Aortic atherosclerosis. Tiny bilateral effusions. No pneumothorax. Vertebral augmentation changes at the upper lumbar spine. Increased interstitial opacity at the lung bases. IMPRESSION: 1. Cardiomegaly with mild central congestion. No edema or infiltrate. 2. Increased interstitial opacity at the lung bases could relate to bronchial inflammation. Electronically Signed   By: Jasmine Pang M.D.   On: 05/25/2017 00:00      Lab Results  Component Value Date   HGBA1C 5.5 12/24/2014   HGBA1C 5.6 01/14/2013   HGBA1C 5.9 (H) 07/09/2011   Lab Results  Component Value Date   LDLCALC 75 12/24/2014   CREATININE 1.55 (H) 05/26/2017       Scheduled Meds: . amLODipine  10 mg Oral  Daily  . aspirin  325 mg Oral Daily  . Chlorhexidine Gluconate Cloth  6 each Topical Q0600  . citalopram  10 mg Oral Daily  . doxycycline  100 mg Oral Q12H  . enoxaparin (LOVENOX) injection  40 mg Subcutaneous Q24H  . furosemide  60 mg Intravenous Q12H  . ipratropium-albuterol  3 mL Nebulization TID  . lisinopril  10 mg Oral Daily  . methylPREDNISolone (SOLU-MEDROL) injection  60 mg Intravenous Q12H  . metoprolol succinate  25 mg Oral Daily  . mometasone-formoterol  2 puff Inhalation BID  . mupirocin ointment  1 application Nasal BID  . pravastatin  80 mg Oral Daily   Continuous Infusions:   LOS: 1 day    Time spent: >30 MINS    Richarda Overlie  [facility] Pager [phone]. If 7PM-7AM, please contact night-coverage at [email or web address], password [facility]  05/26/2017, 8:32 AM  LOS: 1 day

## 2017-05-27 DIAGNOSIS — N183 Chronic kidney disease, stage 3 (moderate): Secondary | ICD-10-CM

## 2017-05-27 DIAGNOSIS — I340 Nonrheumatic mitral (valve) insufficiency: Secondary | ICD-10-CM

## 2017-05-27 LAB — COMPREHENSIVE METABOLIC PANEL
ALT: 20 U/L (ref 17–63)
ANION GAP: 10 (ref 5–15)
AST: 19 U/L (ref 15–41)
Albumin: 3.3 g/dL — ABNORMAL LOW (ref 3.5–5.0)
Alkaline Phosphatase: 72 U/L (ref 38–126)
BUN: 35 mg/dL — ABNORMAL HIGH (ref 6–20)
CO2: 29 mmol/L (ref 22–32)
Calcium: 8.2 mg/dL — ABNORMAL LOW (ref 8.9–10.3)
Chloride: 105 mmol/L (ref 101–111)
Creatinine, Ser: 1.41 mg/dL — ABNORMAL HIGH (ref 0.61–1.24)
GFR, EST AFRICAN AMERICAN: 55 mL/min — AB (ref >60)
GFR, EST NON AFRICAN AMERICAN: 48 mL/min — AB (ref >60)
Glucose, Bld: 128 mg/dL — ABNORMAL HIGH (ref 65–99)
POTASSIUM: 3.3 mmol/L — AB (ref 3.5–5.1)
SODIUM: 144 mmol/L (ref 135–145)
Total Bilirubin: 0.8 mg/dL (ref 0.3–1.2)
Total Protein: 6 g/dL — ABNORMAL LOW (ref 6.5–8.1)

## 2017-05-27 LAB — CBC
HCT: 42.3 % (ref 39.0–52.0)
HEMOGLOBIN: 13.1 g/dL (ref 13.0–17.0)
MCH: 27.2 pg (ref 26.0–34.0)
MCHC: 31 g/dL (ref 30.0–36.0)
MCV: 87.8 fL (ref 78.0–100.0)
Platelets: 190 10*3/uL (ref 150–400)
RBC: 4.82 MIL/uL (ref 4.22–5.81)
RDW: 17.7 % — ABNORMAL HIGH (ref 11.5–15.5)
WBC: 9.5 10*3/uL (ref 4.0–10.5)

## 2017-05-27 MED ORDER — FUROSEMIDE 10 MG/ML IJ SOLN
80.0000 mg | Freq: Two times a day (BID) | INTRAMUSCULAR | Status: DC
  Administered 2017-05-27 – 2017-05-28 (×2): 80 mg via INTRAVENOUS
  Filled 2017-05-27 (×2): qty 8

## 2017-05-27 MED ORDER — METOLAZONE 2.5 MG PO TABS
2.5000 mg | ORAL_TABLET | Freq: Once | ORAL | Status: AC
  Administered 2017-05-27: 2.5 mg via ORAL
  Filled 2017-05-27: qty 1

## 2017-05-27 MED ORDER — SPIRONOLACTONE 25 MG PO TABS
12.5000 mg | ORAL_TABLET | Freq: Every day | ORAL | Status: DC
  Administered 2017-05-27: 12.5 mg via ORAL
  Filled 2017-05-27: qty 1

## 2017-05-27 MED ORDER — POTASSIUM CHLORIDE CRYS ER 20 MEQ PO TBCR
40.0000 meq | EXTENDED_RELEASE_TABLET | Freq: Once | ORAL | Status: AC
  Administered 2017-05-27: 40 meq via ORAL
  Filled 2017-05-27: qty 2

## 2017-05-27 MED ORDER — LOSARTAN POTASSIUM 25 MG PO TABS
25.0000 mg | ORAL_TABLET | Freq: Every day | ORAL | Status: DC
  Administered 2017-05-28 – 2017-05-29 (×2): 25 mg via ORAL
  Filled 2017-05-27 (×3): qty 1

## 2017-05-27 MED ORDER — POTASSIUM CHLORIDE CRYS ER 20 MEQ PO TBCR
40.0000 meq | EXTENDED_RELEASE_TABLET | Freq: Every day | ORAL | Status: DC
  Administered 2017-05-27: 40 meq via ORAL
  Filled 2017-05-27: qty 2

## 2017-05-27 MED ORDER — AMLODIPINE BESYLATE 5 MG PO TABS
5.0000 mg | ORAL_TABLET | Freq: Every day | ORAL | Status: DC
  Administered 2017-05-28 – 2017-05-29 (×2): 5 mg via ORAL
  Filled 2017-05-27 (×3): qty 1

## 2017-05-27 NOTE — Plan of Care (Signed)
Problem: Tissue Perfusion: Goal: Risk factors for ineffective tissue perfusion will decrease Outcome: Progressing Remains on room air, O2 sats in the 90's   Problem: Activity: Goal: Risk for activity intolerance will decrease Outcome: Progressing Dyspnea is improving per patient   Problem: Fluid Volume: Goal: Ability to maintain a balanced intake and output will improve Outcome: Progressing Continues to diurese

## 2017-05-27 NOTE — Consult Note (Addendum)
Advanced Heart Failure Team Consult Note   Referring: [facility] Primary Cardiologist:  Crenshaw  Reason for Consultation: Heart failure management   HPI:    [name] [name] is seen today for evaluation of HF at the request of Dr. Rennis Golden.   76 y.o. male w/ hx CABG in 1998 w/ LIMA-LAD, SVG-D1 SVG-OM2-OM3and SVG-RCA, patent grafts by cath in 2013, ICM (s/p St.Jude CRTD 2013), S-D-CHF (EF 25-30% by echo in 07/2016), PAF (s/p LAA closure with WATCHMAN in 05/2016 with AVN ablation in 08/2016), COPD, AAA (s/p repair in 12/2015 - followed by Vascular), HTN, HLD, and continued tobacco use. Admitted 06/15 w/ CHF/COPD.  At baseline able to do all ADLs without too much difficulty. Last week developed worsening SOB and wheezing but didn't come to hospital. Got worse and finally came to ER on 6/15. Had evidence of COPD flare and acute HF. CXR with mild edema. BNP 2353   Treated with nebs, steroids and abx as well as lasix. Initially didn't respond well to IV lasix. creatinine 1.4 -> 1.55. IV lasix increased yesterday and now starting to diurese but weight still up. Breathing some better but still wheezing. No CP.   Echo this admit EF 20-25% with moderate RV HK. Severe posterior MR. Smokes 1.5 ppd   Review of Systems: [y] = yes, [ ]  = no   General: Weight gain [facility] ]; Weight loss [ ] ; Anorexia [ ] ; Fatigue [facility] ]; Fever [ ] ; Chills [ ] ; Weakness [ ]   Cardiac: Chest pain/pressure [ ] ; Resting SOB [facility] ]; Exertional SOB [ y]; Orthopnea [ y]; Pedal Edema [ y]; Palpitations [ ] ; Syncope [ ] ; Presyncope [ ] ; Paroxysmal nocturnal dyspnea[ y]  Pulmonary: Cough [facility] ]; [facility] ]; Hemoptysis[ ] ; Sputum [ ] ; Snoring [ ]   GI: Vomiting[ ] ; Dysphagia[ ] ; Melena[ ] ; Hematochezia [ ] ; Heartburn[ ] ; Abdominal pain [ ] ; Constipation [ ] ; Diarrhea [ ] ; BRBPR [ ]   GU: Hematuria[ ] ; Dysuria [ ] ; Nocturia[ ]   Vascular: Pain in legs with walking [ ] ; Pain in feet with lying flat [ ] ; Non-healing sores [ ] ; Stroke [ ] ; TIA [  ]; Slurred speech [ ] ;  Neuro: Headaches[ ] ; Vertigo[ ] ; Seizures[ ] ; Paresthesias[ ] ;Blurred vision [ ] ; Diplopia [ ] ; Vision changes [ ]   Ortho/Skin: Arthritis [ y]; Joint pain [facility] ]; Muscle pain [ ] ; Joint swelling [ ] ; Back Pain [ ] ; Rash [ ]   Psych: Depression[ ] ; Anxiety[ ]   Heme: Bleeding problems [facility] ]; Clotting disorders [ ] ; Anemia [facility] ]  Endocrine: Diabetes [facility] ]; Thyroid dysfunction[ ]   Home Medications Prior to Admission medications   Medication Sig Start Date End Date Taking? Authorizing Provider  albuterol (PROVENTIL) (2.5 MG/3ML) 0.083% nebulizer solution USE ONE VIAL IN NEBULIZER EVERY 4 HOURS AS NEEDED FOR WHEEZING AND FOR SHORTNESS OF BREATH 04/13/17  Yes Leslye Peer, MD  amLODipine (NORVASC) 10 MG tablet Take 10 mg by mouth daily.   Yes [provider]  aspirin EC 325 MG EC tablet Take 1 tablet (325 mg total) by mouth daily. 08/09/16  Yes Seiler, Amber K, NP  citalopram (CELEXA) 10 MG tablet Take 10 mg by mouth daily. Reported on 06/06/2016   Yes [provider]  furosemide (LASIX) 20 MG tablet Take 3 tablets (60 mg total) by mouth daily. Patient taking differently: Take 60 mg by mouth every 3 (three) days.  05/18/16  Yes Seiler, Amber K, NP  ibuprofen (ADVIL,MOTRIN) 200 MG tablet Take 400 mg  by mouth every 6 (six) hours as needed (For pain.).   Yes [provider]  lisinopril (PRINIVIL,ZESTRIL) 10 MG tablet Take 10 mg by mouth daily.  07/31/16  Yes [provider]  metoprolol succinate (TOPROL XL) 25 MG 24 hr tablet Take 1 tablet (25 mg total) by mouth daily. 08/07/16  Yes Lewayne Bunting, MD  Multiple Vitamin (MULITIVITAMIN WITH MINERALS) TABS Take 1 tablet by mouth daily.   Yes [provider]  niacin (NIASPAN) 500 MG CR tablet Take 1,000 mg by mouth 2 (two) times daily.    Yes [provider]  nitroGLYCERIN (NITROSTAT) 0.4 MG SL tablet Place 1 tablet (0.4 mg total) under the tongue every 5 (five) minutes as needed for chest  pain. 02/29/16  Yes Lewayne Bunting, MD  potassium chloride SA (K-DUR,KLOR-CON) 20 MEQ tablet TAKE 2 TABLETS THREE TIMES DAILY. PLEASE SCHEDULE APPOINTMENT FOR REFILLS. 09/18/16  Yes Lewayne Bunting, MD  pravastatin (PRAVACHOL) 80 MG tablet Take 1 tablet (80 mg total) by mouth daily. 12/18/16  Yes Lewayne Bunting, MD    Past Medical History: Past Medical History:  Diagnosis Date  . Aortic dissection (HCC)    H/O focal aortic dissection  . CAD (coronary artery disease)    a. s/p CABG in 1998 with LIMA-LAD, SVG-D1, SVG-OM2-OM3, and SVG-RCA b. cath 10/07:  LM ok, LAD occluded, AV CFX 95%, pRCA occluded; L-LAD, S-OM2/OM3, S-PDA ok c. 2013: cath showing patent grafts  . Cerebrovascular disease    a. s/p prior Left CEA;  followed by Dr. [name]  . CHF (congestive heart failure) (HCC)    a. s/p STJ CRTD  . CKD (chronic kidney disease)   . COPD (chronic obstructive pulmonary disease) (HCC)   . Depression   . DVT (deep venous thrombosis) (HCC)   . History of stomach ulcers   . Hyperlipidemia   . Hypertension   . Iliac artery aneurysm, left (HCC)    followed by Dr. [name]  . Ischemic cardiomyopathy    a. s/p St. Jude CRTD placement in 2013  . LBBB (left bundle branch block)   . Myocardial infarction (HCC) 1997  . Nephrolithiasis   . PAF (paroxysmal atrial fibrillation) (HCC)    a. on amio and pradaxa. b. s/p AVN ablation  . Stroke [facility]) 12/2015 "several mini"   a. on coumadin --> switched to pradaxa b. s/p Watchman in 05/2016    Past Surgical History: Past Surgical History:  Procedure Laterality Date  . ABDOMINAL AORTIC ANEURYSM REPAIR N/A 01/03/2016   Procedure: ANEURYSM ABDOMINAL AORTA, BILATERAL COMMON ILIAC REPAIR;  Surgeon: [name], MD;  Location: [facility] OR;  Service: Vascular;  Laterality: N/A;  . ABDOMINAL AORTIC ANEURYSM REPAIR  01/03/2016  . APPENDECTOMY  09/11/11  . BACK SURGERY    . BI-VENTRICULAR IMPLANTABLE CARDIOVERTER DEFIBRILLATOR N/A 08/22/2012   SJM Quadra Assura BiV  ICD implanted by Dr [name]  . CARDIAC CATHETERIZATION  1998; 2013  . CARDIOVERSION N/A 07/26/2016   Procedure: CARDIOVERSION;  Surgeon: Lars Masson, MD;  Location: [facility] ENDOSCOPY;  Service: Cardiovascular;  Laterality: N/A;  . CAROTID ENDARTERECTOMY Left July  2005  . CORONARY ARTERY BYPASS GRAFT  1998   CABG X5  . CYSTOSCOPY W/ STONE MANIPULATION  X 2  . ELECTROPHYSIOLOGIC STUDY N/A 08/17/2016   AVN ablation by Dr [name]  . ESOPHAGOGASTRODUODENOSCOPY N/A 02/28/2016   Procedure: ESOPHAGOGASTRODUODENOSCOPY (EGD);  Surgeon: [name], MD;  Location: [facility] ENDOSCOPY;  Service: Endoscopy;  Laterality: N/A;  .  LEFT ATRIAL APPENDAGE OCCLUSION N/A 06/01/2016   Watchman device implanted by Dr [name]  . POSTERIOR FUSION LUMBAR SPINE  Sep 27, 2014   L4-L5  . PR VEIN BYPASS GRAFT,AORTO-FEM-POP  01/03/2016  . TEE WITHOUT CARDIOVERSION N/A 07/26/2016   Procedure: TRANSESOPHAGEAL ECHOCARDIOGRAM (TEE);  Surgeon: Lars Masson, MD;  Location: [facility] ENDOSCOPY;  Service: Cardiovascular;  Laterality: N/A;    Family History: Family History  Problem Relation Age of Onset  . Stroke Maternal Uncle   . Stroke Maternal Uncle   . Heart attack Neg Hx   . Hypertension Neg Hx     Social History: Social History   Social History  . Marital status: Widowed    Spouse name: N/A  . Number of children: 3  . Years of education: 5 TH   Occupational History  . RETIRED---truck driver Retired   Social History Main Topics  . Smoking status: Current Some Day Smoker    Packs/day: 0.50    Years: 48.00    Types: Cigarettes    Last attempt to quit: 12/11/2004  . Smokeless tobacco: Never Used     Comment: stated "I'm trying to quit  . Alcohol use 0.0 oz/week     Comment: 06/01/2016 "haven't had a drink in years"  . Drug use: No  . Sexual activity: Yes   Other Topics Concern  . None   Social History Narrative   Patient is married with 3 children.   Patient is right handed.   Patient has 10 th grade  education.   Patient drinks tea occasionally.    Allergies:  Allergies  Allergen Reactions  . Buprenorphine Hcl Other (See Comments)    Patient stated that he did not do well on this medication. He felt like climbing the walls  . Morphine And Related Other (See Comments)    Patient stated that he did not do well on this medication. He felt like climbing the walls    Objective:    Vital Signs:   Temp:  [98 F (36.7 C)-98.2 F (36.8 C)] 98 F (36.7 C) (06/17 0622) Pulse Rate:  [76-82] 80 (06/17 0900) Resp:  [18] 18 (06/17 0622) BP: (145-148)/(75-84) 148/79 (06/17 0900) SpO2:  [92 %] 92 % (06/17 0900) Weight:  [83.6 kg (184 lb 4.8 oz)] 83.6 kg (184 lb 4.8 oz) (06/17 0622) Last BM Date: 05/26/17  Weight change: Filed Weights   05/25/17 0542 05/26/17 0505 05/27/17 0622  Weight: 84.6 kg (186 lb 6.4 oz) 82.8 kg (182 lb 9.6 oz) 83.6 kg (184 lb 4.8 oz)    Intake/Output:   Intake/Output Summary (Last 24 hours) at 05/27/17 1433 Last data filed at 05/27/17 0500  Gross per 24 hour  Intake              480 ml  Output             1750 ml  Net            -1270 ml     Physical Exam: General:  Lying in bed. + cough and audible wheezing  HEENT: normal Neck: supple. JVP to jaw with prominent CV waves . Carotids 2+ bilat; no bruits. No lymphadenopathy or thyromegaly appreciated. Cor: PMI nonpalpable distant  Regular rate & rhythm. 2/6 MR Lungs: limited air movmement +active wheezing  Abdomen: soft, nontender, nondistended. No hepatosplenomegaly. No bruits or masses. Good bowel sounds. Extremities: no cyanosis, clubbing, rash, trace edema Neuro: alert & orientedx3, cranial nerves grossly intact. moves all 4 extremities  w/o difficulty. Affect pleasant  Telemetry: AF with biv pacing Personally reviewed   Labs: Basic Metabolic Panel:  Recent Labs Lab 05/25/17 0004 05/25/17 0829 05/25/17 0917 05/26/17 0302 05/27/17 0548  NA 141  --  142 145 144  K 2.8*  --  2.8* 3.9 3.3*  CL  107  --  105 106 105  CO2 26  --  24 30 29   GLUCOSE 116*  --  270* 136* 128*  BUN 19  --  20 28* 35*  CREATININE 1.32*  --  1.41* 1.55* 1.41*  CALCIUM 8.5*  --  8.5* 8.4* 8.2*  MG  --  1.7 1.8  --   --     Liver Function Tests:  Recent Labs Lab 05/25/17 0004 05/25/17 0917 05/27/17 0548  AST 21 28 19   ALT 19 20 20   ALKPHOS 69 75 72  BILITOT 1.2 0.9 0.8  PROT 5.8* 6.1* 6.0*  ALBUMIN 3.4* 3.4* 3.3*   No results for input(s): LIPASE, AMYLASE in the last 168 hours. No results for input(s): AMMONIA in the last 168 hours.  CBC:  Recent Labs Lab 05/25/17 0004 05/26/17 0302 05/27/17 0548  WBC 7.4 10.5 9.5  NEUTROABS 5.8  --   --   HGB 12.6* 12.9* 13.1  HCT 40.7 41.9 42.3  MCV 87.0 86.9 87.8  PLT 162 184 190    Cardiac Enzymes: No results for input(s): CKTOTAL, CKMB, CKMBINDEX, TROPONINI in the last 168 hours.  BNP: BNP (last 3 results)  Recent Labs  05/25/17 0004  BNP 2,353.9*    ProBNP (last 3 results) No results for input(s): PROBNP in the last 8760 hours.   CBG: No results for input(s): GLUCAP in the last 168 hours.  Coagulation Studies: No results for input(s): LABPROT, INR in the last 72 hours.  Other results: EKG: AF 75 with bivpacing Personally reviewed   Imaging:  No results found.   Medications:     Current Medications: . amLODipine  10 mg Oral Daily  . aspirin  325 mg Oral Daily  . Chlorhexidine Gluconate Cloth  6 each Topical Q0600  . citalopram  10 mg Oral Daily  . doxycycline  100 mg Oral Q12H  . enoxaparin (LOVENOX) injection  40 mg Subcutaneous Q24H  . furosemide  60 mg Intravenous BID  . guaiFENesin-codeine  5 mL Oral Q12H  . ipratropium-albuterol  3 mL Nebulization TID  . lisinopril  10 mg Oral Daily  . methylPREDNISolone (SOLU-MEDROL) injection  60 mg Intravenous Q12H  . metoprolol succinate  25 mg Oral Daily  . mometasone-formoterol  2 puff Inhalation BID  . mupirocin ointment  1 application Nasal BID  . potassium  chloride  40 mEq Oral Daily  . pravastatin  80 mg Oral Daily     Infusions:    Assessment/Plan   1. Acute on chronic systolic HF due to iCM/ s/p CABG 1998 with BIVICD - Echo this admit reviewed personally EF 20-25% with moderate RV HK and severe posterior MR - NYHA III at baseline. Now NYHA IV - Remains volume overloaded. Seems to be responding to higher dose IV lasix. Will increase to 80 IV bid - Plan RHC later this admit. May need intoropes - Will hold metoprolol for now with active wheezing. Can likely switch to bisoprolol in futtre - Switch lisinopril to losartan with possible change to Entresto down the road - Start spiro 12.5  2. Acute hypoxic respiratory failure - Multifactorial due to COPD flare and ADHF - Continue nebs,  steroids and abx - Long talk about need to stop smoking (quit for 12 years but restarted last year) 3. CKD 3 - Watch closely with diuresis 4. CAD s/p CABG 1998 patent grafts by cath in 2013 - no current ischemic symptoms 5. 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 Severe mitral regurgitation -Echos reviewed personally. MR worse since previous.  - Likely combination of ischmic MR with restricted posterior leaflet and volume overload with dilated annulus. ? If it has gotten worse with more bivpacing (though this usually makes it better) - Would not consider MitraClip at this point. Will re-evaluate MR after full diuresis 7. PAD  --s/p AAA repair  8. Ongoing tobacco use --stressed need to quit.  9. Hypokalemia - supp K - Start spiro  Length of Stay: 2  Arvilla Meres, MD  05/27/2017, 2:33 PM  Advanced Heart Failure Team Pager [phone] (M-F; 7a - 4p)  Please contact [facility] for night-coverage after hours (4p -7a ) and weekends on [email or web address]

## 2017-05-27 NOTE — Progress Notes (Signed)
PROGRESS NOTE                                                                                                                                                                                                             Patient Demographics:    [name] [name], is a 76 y.o. male, DOB - Feb 22, 1941, ZOX:[id number]  Admit date - 05/24/2017   Admitting Physician Alberteen Sam, MD  Outpatient Primary MD for the patient is [name], MD  LOS - 2  Outpatient Specialists:Cardiology cardiology  Chief Complaint  Patient presents with  . Shortness of Breath       Brief Narrative   76 year old male with history of severe cardiomyopathy (EF of 25% with ICD/pacer), COPD not on home O2 with continues to use, chronic kidney disease stage III, CAD with history of CABG in 53, (s/p LAA closure with WATCHMAN in 05/2016 with AVN ablation in 08/2016), COPD, AAA (s/p repair in 12/2015 ), hypertension, hyperlipidemia who presented with acute COPD exacerbation and acute on chronic systolic CHF.    Subjective:   Reports his breathing minimally improved. Still wheezing actively.   Assessment  & Plan :    Principal Problem:   COPD With acute exacerbation (HCC) Sats stable on room air but still has active wheezing. Continue scheduled Solu-Medrol, scheduled nebs. Continue doxycycline twice a day. Supportive care with Tylenol and antitussives. Counseled strongly on smoking cessation.   Active Problems:  Acute on chronic combined systolic and diastolic CHF (congestive heart failure) (HCC) Symptoms slightly better with increase IV Lasix. Echo done this admission with EF of 20-25% with severe posterior MR and RV hypokinesis. Heart failure team consulted. Lasix dose further increased to 80 mg IV twice a day. Plan on right heart catheterization this admission. Likely needs inotropes. Metoprolol held due to active wheezing. Lisinopril  switch to losartan, possibly needs contrast. Added Aldactone. Replenish potassium.  Chronic atrial fibrillation. Status post AV node ablation in September 2017. LAA loose with watchman June 2017. metoprolol switched to bisoprolol  Severe mitral regurgitation Worsened MR on recent echo. Cardiology recommended to evaluate for for diuresis.  CAD- CABG '98- cath 2007 and Aug 2013 On medical management.  Tachycardia-bradycardia syndrome (HCC) Status post ICD.   Peripheral artery disease      Code Status : Full code  Family Communication  :  Daughter at bedside  Disposition Plan  : Pending hospital course  Barriers For Discharge : acitve symptoms  Consults  :   Heart failure  Procedures  : echo  DVT Prophylaxis  :  heaprin  Lab Results  Component Value Date   PLT 190 05/27/2017    Antibiotics  :    Anti-infectives    Start     Dose/Rate Route Frequency Ordered Stop   05/25/17 1000  doxycycline (VIBRA-TABS) tablet 100 mg     100 mg Oral Every 12 hours 05/25/17 0557          Objective:   Vitals:   05/26/17 1954 05/26/17 2020 05/27/17 0622 05/27/17 0900  BP: (!) 147/75  (!) 145/84 (!) 148/79  Pulse: 82 80 76 80  Resp: 18 18 18    Temp: 98.2 F (36.8 C)  98 F (36.7 C)   TempSrc: Oral  Oral   SpO2: 92%  92% 92%  Weight:   83.6 kg (184 lb 4.8 oz)   Height:        Wt Readings from Last 3 Encounters:  05/27/17 83.6 kg (184 lb 4.8 oz)  04/10/17 81.6 kg (180 lb)  02/01/17 78.5 kg (173 lb)     Intake/Output Summary (Last 24 hours) at 05/27/17 1522 Last data filed at 05/27/17 0500  Gross per 24 hour  Intake              480 ml  Output             1750 ml  Net            -1270 ml     Physical Exam  Gen: not in distress HEENT: no pallor, moist mucosa, supple neck, JVD+ Chest: Diffuse wheezing bilaterally CVS: S1 and S2, systolic murmur 3/6 GI: soft, NT, ND,  Musculoskeletal: warm, no edema     Data Review:    CBC  Recent Labs Lab  05/25/17 0004 05/26/17 0302 05/27/17 0548  WBC 7.4 10.5 9.5  HGB 12.6* 12.9* 13.1  HCT 40.7 41.9 42.3  PLT 162 184 190  MCV 87.0 86.9 87.8  MCH 26.9 26.8 27.2  MCHC 31.0 30.8 31.0  RDW 17.5* 17.6* 17.7*  LYMPHSABS 0.7  --   --   MONOABS 0.5  --   --   EOSABS 0.3  --   --   BASOSABS 0.1  --   --     Chemistries   Recent Labs Lab 05/25/17 0004 05/25/17 0829 05/25/17 0917 05/26/17 0302 05/27/17 0548  NA 141  --  142 145 144  K 2.8*  --  2.8* 3.9 3.3*  CL 107  --  105 106 105  CO2 26  --  24 30 29   GLUCOSE 116*  --  270* 136* 128*  BUN 19  --  20 28* 35*  CREATININE 1.32*  --  1.41* 1.55* 1.41*  CALCIUM 8.5*  --  8.5* 8.4* 8.2*  MG  --  1.7 1.8  --   --   AST 21  --  28  --  19  ALT 19  --  20  --  20  ALKPHOS 69  --  75  --  72  BILITOT 1.2  --  0.9  --  0.8   ------------------------------------------------------------------------------------------------------------------ No results for input(s): CHOL, HDL, LDLCALC, TRIG, CHOLHDL, LDLDIRECT in the last 72 hours.  Lab Results  Component Value Date   HGBA1C 5.5 12/24/2014   ------------------------------------------------------------------------------------------------------------------ No results for  input(s): TSH, T4TOTAL, T3FREE, THYROIDAB in the last 72 hours.  Invalid input(s): FREET3 ------------------------------------------------------------------------------------------------------------------ No results for input(s): VITAMINB12, FOLATE, FERRITIN, TIBC, IRON, RETICCTPCT in the last 72 hours.  Coagulation profile No results for input(s): INR, PROTIME in the last 168 hours.  No results for input(s): DDIMER in the last 72 hours.  Cardiac Enzymes No results for input(s): CKMB, TROPONINI, MYOGLOBIN in the last 168 hours.  Invalid input(s): CK ------------------------------------------------------------------------------------------------------------------    Component Value Date/Time   BNP 2,353.9  (H) 05/25/2017 0004   BNP 1,794.7 (H) 03/28/2016 1017    Inpatient Medications  Scheduled Meds: . [START ON 05/28/2017] amLODipine  5 mg Oral Daily  . aspirin  325 mg Oral Daily  . Chlorhexidine Gluconate Cloth  6 each Topical Q0600  . citalopram  10 mg Oral Daily  . doxycycline  100 mg Oral Q12H  . enoxaparin (LOVENOX) injection  40 mg Subcutaneous Q24H  . furosemide  80 mg Intravenous BID  . guaiFENesin-codeine  5 mL Oral Q12H  . ipratropium-albuterol  3 mL Nebulization TID  . [START ON 05/28/2017] losartan  25 mg Oral Daily  . methylPREDNISolone (SOLU-MEDROL) injection  60 mg Intravenous Q12H  . metolazone  2.5 mg Oral Once  . mometasone-formoterol  2 puff Inhalation BID  . mupirocin ointment  1 application Nasal BID  . potassium chloride  40 mEq Oral Daily  . potassium chloride  40 mEq Oral Once  . pravastatin  80 mg Oral Daily  . spironolactone  12.5 mg Oral Daily   Continuous Infusions: PRN Meds:.acetaminophen **OR** acetaminophen, albuterol  Micro Results Recent Results (from the past 240 hour(s))  Respiratory Panel by PCR     Status: None   Collection Time: 05/25/17  5:18 AM  Result Value Ref Range Status   Adenovirus NOT DETECTED NOT DETECTED Final   Coronavirus 229E NOT DETECTED NOT DETECTED Final   Coronavirus HKU1 NOT DETECTED NOT DETECTED Final   Coronavirus NL63 NOT DETECTED NOT DETECTED Final   Coronavirus OC43 NOT DETECTED NOT DETECTED Final   Metapneumovirus NOT DETECTED NOT DETECTED Final   Rhinovirus / Enterovirus NOT DETECTED NOT DETECTED Final   Influenza A NOT DETECTED NOT DETECTED Final   Influenza B NOT DETECTED NOT DETECTED Final   Parainfluenza Virus 1 NOT DETECTED NOT DETECTED Final   Parainfluenza Virus 2 NOT DETECTED NOT DETECTED Final   Parainfluenza Virus 3 NOT DETECTED NOT DETECTED Final   Parainfluenza Virus 4 NOT DETECTED NOT DETECTED Final   Respiratory Syncytial Virus NOT DETECTED NOT DETECTED Final   Bordetella pertussis NOT DETECTED  NOT DETECTED Final   Chlamydophila pneumoniae NOT DETECTED NOT DETECTED Final   Mycoplasma pneumoniae NOT DETECTED NOT DETECTED Final  MRSA PCR Screening     Status: Abnormal   Collection Time: 05/25/17  6:05 AM  Result Value Ref Range Status   MRSA by PCR POSITIVE (A) NEGATIVE Final    Comment:        The GeneXpert MRSA Assay (FDA approved for NASAL specimens only), is one component of a comprehensive MRSA colonization surveillance program. It is not intended to diagnose MRSA infection nor to guide or monitor treatment for MRSA infections. RESULT CALLED TO, READ BACK BY AND VERIFIED WITH: L MUELLER 05/25/17 @ 0839 Annye Asa     Radiology Reports Dg Chest 2 View  Result Date: 05/25/2017 CLINICAL DATA:  Shortness of breath EXAM: CHEST  2 VIEW COMPARISON:  03/12/2016 FINDINGS: Left-sided pacing device as before. Post sternotomy changes. Vascular occlusive  device on lateral view. Cardiomegaly with central vascular congestion. No focal infiltrate or effusion. Aortic atherosclerosis. Tiny bilateral effusions. No pneumothorax. Vertebral augmentation changes at the upper lumbar spine. Increased interstitial opacity at the lung bases. IMPRESSION: 1. Cardiomegaly with mild central congestion. No edema or infiltrate. 2. Increased interstitial opacity at the lung bases could relate to bronchial inflammation. Electronically Signed   By: Jasmine Pang M.D.   On: 05/25/2017 00:00    Time Spent in minutes 35   Eddie North M.D on 05/27/2017 at 3:22 PM  Between 7am to 7pm - Pager - [phone]  After 7pm go to [email or web address] - password [facility]  [facility] -  Office  [phone]

## 2017-05-27 NOTE — Progress Notes (Signed)
Patient without complaint on 7 a to 7 p shift, continues to diurese.   Felt better after speaking with Dr. [name] regarding his possible valve replacement.  Family at bedside majority of shift.

## 2017-05-27 NOTE — Progress Notes (Signed)
Progress Note  Patient Name: [name] [name] Date of Encounter: 05/27/2017  Primary Cardiologist: Dr [name]  Patient Profile     76 y.o. male w/ hx CABG in 1998 w/ LIMA-LAD, SVG-D1 SVG-OM2-OM3 and SVG-RCA, patent grafts by cath in 2013, ICM (s/p St.Jude CRTD 2013), S-D-CHF (EF 25-30% by echo in 07/2016), PAF (s/p LAA closure with WATCHMAN in 05/2016 with AVN ablation in 08/2016), COPD, AAA (s/p repair in 12/2015 - followed by Vascular), HTN, HLD, and continued tobacco use. Admitted 06/15 w/ CHF/COPD.  Subjective   Breathing better today, still w/ wheeze. Concerned about where he can get the MV repaired.   Inpatient Medications    Scheduled Meds: . amLODipine  10 mg Oral Daily  . aspirin  325 mg Oral Daily  . Chlorhexidine Gluconate Cloth  6 each Topical Q0600  . citalopram  10 mg Oral Daily  . doxycycline  100 mg Oral Q12H  . enoxaparin (LOVENOX) injection  40 mg Subcutaneous Q24H  . furosemide  60 mg Intravenous BID  . guaiFENesin-codeine  5 mL Oral Q12H  . ipratropium-albuterol  3 mL Nebulization TID  . lisinopril  10 mg Oral Daily  . methylPREDNISolone (SOLU-MEDROL) injection  60 mg Intravenous Q12H  . metoprolol succinate  25 mg Oral Daily  . mometasone-formoterol  2 puff Inhalation BID  . mupirocin ointment  1 application Nasal BID  . potassium chloride  40 mEq Oral Daily  . pravastatin  80 mg Oral Daily   Continuous Infusions:  PRN Meds: acetaminophen **OR** acetaminophen, albuterol   Vital Signs    Vitals:   05/26/17 1226 05/26/17 1954 05/26/17 2020 05/27/17 0622  BP: (!) 153/82 (!) 147/75  (!) 145/84  Pulse: 76 82 80 76  Resp:  18 18 18   Temp: 97.6 F (36.4 C) 98.2 F (36.8 C)  98 F (36.7 C)  TempSrc: Oral Oral  Oral  SpO2: 92% 92%  92%  Weight:    184 lb 4.8 oz (83.6 kg)  Height:        Intake/Output Summary (Last 24 hours) at 05/27/17 0850 Last data filed at 05/27/17 0500  Gross per 24 hour  Intake              480 ml  Output              1950 ml  Net            -1470 ml   Filed Weights   05/25/17 0542 05/26/17 0505 05/27/17 0622  Weight: 186 lb 6.4 oz (84.6 kg) 182 lb 9.6 oz (82.8 kg) 184 lb 4.8 oz (83.6 kg)    Telemetry    V pacing - Personally Reviewed  ECG    n/a - Personally Reviewed  Physical Exam   General: Well developed, well nourished, male appearing in no acute distress. Head: Normocephalic, atraumatic.  Neck: Supple without bruits, JVD 10 cm. Lungs:  Resp regular and unlabored, rhonchi and rales, +wheeze. Heart: RRR, S1, S2, no S3, S4, 3/6 murmur; no rub. Abdomen: Soft, non-tender, non-distended with normoactive bowel sounds. No hepatomegaly. No rebound/guarding. No obvious abdominal masses. Extremities: No clubbing, cyanosis, no edema. Distal pedal pulses are 2+ bilaterally. Neuro: Alert and oriented X 3. Moves all extremities spontaneously. Psych: Normal affect.  Labs    Hematology Recent Labs Lab 05/25/17 0004 05/26/17 0302 05/27/17 0548  WBC 7.4 10.5 9.5  RBC 4.68 4.82 4.82  HGB 12.6* 12.9* 13.1  HCT 40.7 41.9 42.3  MCV 87.0 86.9  87.8  MCH 26.9 26.8 27.2  MCHC 31.0 30.8 31.0  RDW 17.5* 17.6* 17.7*  PLT 162 184 190    Chemistry Recent Labs Lab 05/25/17 0004 05/25/17 0917 05/26/17 0302 05/27/17 0548  NA 141 142 145 144  K 2.8* 2.8* 3.9 3.3*  CL 107 105 106 105  CO2 26 24 30 29   GLUCOSE 116* 270* 136* 128*  BUN 19 20 28* 35*  CREATININE 1.32* 1.41* 1.55* 1.41*  CALCIUM 8.5* 8.5* 8.4* 8.2*  PROT 5.8* 6.1*  --  6.0*  ALBUMIN 3.4* 3.4*  --  3.3*  AST 21 28  --  19  ALT 19 20  --  20  ALKPHOS 69 75  --  72  BILITOT 1.2 0.9  --  0.8  GFRNONAA 51* 48* 42* 48*  GFRAA 60* 55* 49* 55*  ANIONGAP 8 13 9 10      Cardiac Enzymes  Recent Labs Lab 05/25/17 0017  TROPIPOC 0.01     BNP Recent Labs Lab 05/25/17 0004  BNP 2,353.9*     Radiology    Dg Chest 2 View  Result Date: 05/25/2017 CLINICAL DATA:  Shortness of breath EXAM: CHEST  2 VIEW COMPARISON:  03/12/2016  FINDINGS: Left-sided pacing device as before. Post sternotomy changes. Vascular occlusive device on lateral view. Cardiomegaly with central vascular congestion. No focal infiltrate or effusion. Aortic atherosclerosis. Tiny bilateral effusions. No pneumothorax. Vertebral augmentation changes at the upper lumbar spine. Increased interstitial opacity at the lung bases. IMPRESSION: 1. Cardiomegaly with mild central congestion. No edema or infiltrate. 2. Increased interstitial opacity at the lung bases could relate to bronchial inflammation. Electronically Signed   By: Jasmine Pang M.D.   On: 05/25/2017 00:00     Cardiac Studies   ECHO: 05/26/2017 - Left ventricle: The cavity size was mildly dilated. Wall thickness was increased in a pattern of moderate LVH. Systolic function was severely reduced. The estimated ejection fraction was in the range of 20% to 25%. Diffuse hypokinesis. The study is not technically sufficient to allow evaluation of LV diastolic function. LV filling pressure is elevated. - Aortic valve: Sclerosis without stenosis. There was trivial regurgitation. - Aorta: Dilated aortic root. Aortic root dimension: 47 mm (ED). - Mitral valve: Severe, mostly posteriorly directed MR - may be functional. - Left atrium: Severely dilated. - Right ventricle: The cavity size was mildly dilated. Moderately reduced systolic function. - Right atrium: Severely dilated. - Tricuspid valve: There was moderate regurgitation. - Pulmonary arteries: PA peak pressure: 52 mm Hg (S). - Inferior vena cava: The vessel was dilated. The respirophasic diameter changes were blunted (<50%), consistent with elevated central venous pressure. Impressions: - Compared to a prior echo in 2017, the LVEF is lower at 20-25% - there is now severe MR and severe biatrial enlargement.   Patient Profile     76 y.o. male w/ hx CABG in 1998 w/ LIMA-LAD, SVG-D1 SVG-OM2-OM3 and SVG-RCA, patent  grafts by cath in 2013, ICM (s/p St.Jude CRTD 2013), S-D-CHF (EF 25-30% by echo in 07/2016), PAF (s/p LAA closure with WATCHMAN in 05/2016 with AVN ablation in 08/2016), COPD, AAA (s/p repair in 12/2015 - followed by Vascular), HTN, HLD, and continued tobacco use. Admitted 06/15 w/ CHF/COPD.  Assessment & Plan    1.  Acute on chronic combined systolic and diastolic CHF (congestive heart failure) (HCC) - breathing has improved, net - 3.5 L since admit - continue diuresis - EF lower at 20-25% (was 25-30%) with moderate RV dysfunction as well -  there is severe MR and severe biatrial enlargement.   - may need inotropic therapy.  - Dr Rennis Golden to d/w Dr. [name] of the advanced CHF service. Ultimately, ?if it would be worthwhile to purse MitraClip given his severe MR - this seems to be functional given his severely dilated LA.  - Cr down slightly, BUN rising w/ diuresis  2. S/p St Jude CRT-D - Biv pacing - MD advise if interrogation needed.  Otherwise, per IM Principal Problem:   COPD exacerbation (HCC) Active Problems:   CAD- CABG '98- cath 2007 and Aug 2013- Med Rx   Cardiomyopathy, ischemic-EF 25%    Permanent atrial fibrillation (HCC)   Automatic implantable cardioverter-defibrillator in situ   CKD (chronic kidney disease), stage III   Tachycardia-bradycardia syndrome (HCC)   Hypokalemia    Signed, Theodore Demark , PA-C 8:50 AM 05/27/2017 Pager: [phone]

## 2017-05-28 ENCOUNTER — Encounter (HOSPITAL_COMMUNITY): Payer: Self-pay | Admitting: Internal Medicine

## 2017-05-28 ENCOUNTER — Encounter (HOSPITAL_COMMUNITY)
Admission: EM | Disposition: A | Discharge status: Home / Self Care | Payer: Self-pay | Source: Home / Self Care | Attending: Internal Medicine

## 2017-05-28 HISTORY — PX: RIGHT HEART CATH: CATH118263

## 2017-05-28 LAB — POCT I-STAT 3, VENOUS BLOOD GAS (G3P V)
ACID-BASE EXCESS: 14 mmol/L — AB (ref 0.0–2.0)
Acid-Base Excess: 12 mmol/L — ABNORMAL HIGH (ref 0.0–2.0)
Bicarbonate: 39.4 mmol/L — ABNORMAL HIGH (ref 20.0–28.0)
Bicarbonate: 37 mmol/L — ABNORMAL HIGH (ref 20.0–28.0)
O2 SAT: 56 %
O2 SAT: 59 %
PCO2 VEN: 48.8 mmHg (ref 44.0–60.0)
TCO2: 41 mmol/L (ref 0–100)
TCO2: 38 mmol/L (ref 0–100)
pCO2, Ven: 51.5 mmHg (ref 44.0–60.0)
pH, Ven: 7.491 — ABNORMAL HIGH (ref 7.250–7.430)
pH, Ven: 7.487 — ABNORMAL HIGH (ref 7.250–7.430)
pO2, Ven: 28 mmHg — CL (ref 32.0–45.0)
pO2, Ven: 29 mmHg — CL (ref 32.0–45.0)

## 2017-05-28 LAB — BASIC METABOLIC PANEL
Anion gap: 14 (ref 5–15)
BUN: 41 mg/dL — AB (ref 6–20)
CO2: 31 mmol/L (ref 22–32)
CREATININE: 1.49 mg/dL — AB (ref 0.61–1.24)
Calcium: 8.5 mg/dL — ABNORMAL LOW (ref 8.9–10.3)
Chloride: 97 mmol/L — ABNORMAL LOW (ref 101–111)
GFR calc Af Amer: 52 mL/min — ABNORMAL LOW (ref >60)
GFR, EST NON AFRICAN AMERICAN: 44 mL/min — AB (ref >60)
GLUCOSE: 124 mg/dL — AB (ref 65–99)
Potassium: 3.5 mmol/L (ref 3.5–5.1)
SODIUM: 142 mmol/L (ref 135–145)

## 2017-05-28 LAB — POCT I-STAT 3, ART BLOOD GAS (G3+)
Acid-Base Excess: 15 mmol/L — ABNORMAL HIGH (ref 0.0–2.0)
Bicarbonate: 38.7 mmol/L — ABNORMAL HIGH (ref 20.0–28.0)
O2 Saturation: 89 %
PH ART: 7.557 — AB (ref 7.350–7.450)
TCO2: 40 mmol/L (ref 0–100)
pCO2 arterial: 43.5 mmHg (ref 32.0–48.0)
pO2, Arterial: 50 mmHg — ABNORMAL LOW (ref 83.0–108.0)

## 2017-05-28 LAB — MAGNESIUM: MAGNESIUM: 1.9 mg/dL (ref 1.7–2.4)

## 2017-05-28 SURGERY — RIGHT HEART CATH
Anesthesia: LOCAL

## 2017-05-28 MED ORDER — ONDANSETRON HCL 4 MG/2ML IJ SOLN: 4.0000 mg | Freq: Four times a day (QID) | INTRAMUSCULAR | Status: DC | PRN

## 2017-05-28 MED ORDER — SODIUM CHLORIDE 0.9 % IV SOLN: 250.0000 mL | INTRAVENOUS | Status: DC | PRN

## 2017-05-28 MED ORDER — SODIUM CHLORIDE 0.9% FLUSH: 3.0000 mL | INTRAVENOUS | Status: DC | PRN

## 2017-05-28 MED ORDER — LIDOCAINE HCL (PF) 1 % IJ SOLN
INTRAMUSCULAR | Status: AC
  Filled 2017-05-28: qty 30

## 2017-05-28 MED ORDER — LIDOCAINE HCL (PF) 1 % IJ SOLN
INTRAMUSCULAR | Status: DC | PRN
  Administered 2017-05-28: 2 mL via INTRADERMAL
  Administered 2017-05-28: 10 mL via INTRADERMAL

## 2017-05-28 MED ORDER — ACETAMINOPHEN 325 MG PO TABS: 650.0000 mg | ORAL_TABLET | ORAL | Status: DC | PRN

## 2017-05-28 MED ORDER — HEPARIN (PORCINE) IN NACL 2-0.9 UNIT/ML-% IJ SOLN
INTRAMUSCULAR | Status: AC
  Filled 2017-05-28: qty 500

## 2017-05-28 MED ORDER — SODIUM CHLORIDE 0.9 % IV SOLN: INTRAVENOUS | Status: DC

## 2017-05-28 MED ORDER — SPIRONOLACTONE 25 MG PO TABS
25.0000 mg | ORAL_TABLET | Freq: Every day | ORAL | Status: DC
Start: 2017-05-28 — End: 2017-05-29
  Administered 2017-05-28 – 2017-05-29 (×2): 25 mg via ORAL
  Filled 2017-05-28 (×3): qty 1

## 2017-05-28 MED ORDER — SODIUM CHLORIDE 0.9% FLUSH
3.0000 mL | Freq: Two times a day (BID) | INTRAVENOUS | Status: DC
  Administered 2017-05-28 – 2017-05-29 (×2): 3 mL via INTRAVENOUS

## 2017-05-28 MED ORDER — POTASSIUM CHLORIDE CRYS ER 20 MEQ PO TBCR
40.0000 meq | EXTENDED_RELEASE_TABLET | Freq: Two times a day (BID) | ORAL | Status: DC
  Administered 2017-05-28 – 2017-05-29 (×3): 40 meq via ORAL
  Filled 2017-05-28 (×4): qty 2

## 2017-05-28 MED ORDER — SODIUM CHLORIDE 0.9% FLUSH
3.0000 mL | Freq: Two times a day (BID) | INTRAVENOUS | Status: DC
Start: 2017-05-28 — End: 2017-05-28

## 2017-05-28 MED ORDER — HEPARIN (PORCINE) IN NACL 2-0.9 UNIT/ML-% IJ SOLN
INTRAMUSCULAR | Status: AC | PRN
  Administered 2017-05-28: 500 mL

## 2017-05-28 SURGICAL SUPPLY — 10 items
CATH BALLN WEDGE 5F 110CM (CATHETERS) ×1 IMPLANT
CATH SWAN GANZ 7F STRAIGHT (CATHETERS) ×2 IMPLANT
GUIDEWIRE .025 260CM (WIRE) ×1 IMPLANT
KIT HEART RIGHT NAMIC (KITS) ×2 IMPLANT
PACK CARDIAC CATHETERIZATION (CUSTOM PROCEDURE TRAY) ×2 IMPLANT
PROTECTION STATION PRESSURIZED (MISCELLANEOUS) ×2
SHEATH GLIDE SLENDER 4/5FR (SHEATH) ×1 IMPLANT
SHEATH PINNACLE 7F 10CM (SHEATH) ×1 IMPLANT
STATION PROTECTION PRESSURIZED (MISCELLANEOUS) IMPLANT
TRANSDUCER W/STOPCOCK (MISCELLANEOUS) ×2 IMPLANT

## 2017-05-28 NOTE — Progress Notes (Signed)
Advanced Heart Failure Rounding Note  Primary Cardiologist: Crenshaw  Subjective:    Creatinine 1.49. K 3.5   Feeling better today. Still SOB with exertion and still wheezing. Leg swelling improved.   On lasix 80 mg IV BID and negative 2.8 L and weight down 6 lbs overnight.   Objective:   Weight Range: 178 lb 8 oz (81 kg) Body mass index is 23.55 kg/m.   Vital Signs:   Temp:  [98.5 F (36.9 C)-99 F (37.2 C)] 99 F (37.2 C) (06/18 0541) Pulse Rate:  [66-100] 100 (06/18 0541) Resp:  [18] 18 (06/18 0541) BP: (145-158)/(76-90) 145/90 (06/18 0541) SpO2:  [92 %-98 %] 98 % (06/18 0541) Weight:  [178 lb 8 oz (81 kg)] 178 lb 8 oz (81 kg) (06/18 0541) Last BM Date: 05/27/17  Weight change: Filed Weights   05/26/17 0505 05/27/17 0622 05/28/17 0541  Weight: 182 lb 9.6 oz (82.8 kg) 184 lb 4.8 oz (83.6 kg) 178 lb 8 oz (81 kg)    Intake/Output:   Intake/Output Summary (Last 24 hours) at 05/28/17 0745 Last data filed at 05/28/17 0630  Gross per 24 hour  Intake              480 ml  Output             3350 ml  Net            -2870 ml     Physical Exam: General:  Elderly appearing.  + cough with audible wheeze.  HEENT: Normal Neck: supple. JVP ~6-7 with mild HJR. Carotids 2+ bilat; no bruits. No lymphadenopathy or thyromegaly appreciated. Cor: PMI nondisplaced. Regular rate & rhythm. 2/6 MR Lungs: Limited air movement, + mild active wheezing.  Abdomen: soft, nontender, nondistended. No hepatosplenomegaly. No bruits or masses. Good bowel sounds. Extremities: no cyanosis, clubbing, rash, or edema.  Neuro: alert & orientedx3, cranial nerves grossly intact. moves all 4 extremities w/o difficulty. Affect pleasant  Telemetry: Personally reviewed, AF with BiV pacing.   Labs: CBC  Recent Labs  05/26/17 0302 05/27/17 0548  WBC 10.5 9.5  HGB 12.9* 13.1  HCT 41.9 42.3  MCV 86.9 87.8  PLT 184 190   Basic Metabolic Panel  Recent Labs  05/25/17 0829  05/25/17 0917  05/26/17 0302 05/27/17 0548  NA  --   < > 142 145 144  K  --   < > 2.8* 3.9 3.3*  CL  --   < > 105 106 105  CO2  --   < > 24 30 29   GLUCOSE  --   < > 270* 136* 128*  BUN  --   < > 20 28* 35*  CREATININE  --   < > 1.41* 1.55* 1.41*  CALCIUM  --   < > 8.5* 8.4* 8.2*  MG 1.7  --  1.8  --   --   < > = values in this interval not displayed. Liver Function Tests  Recent Labs  05/25/17 0917 05/27/17 0548  AST 28 19  ALT 20 20  ALKPHOS 75 72  BILITOT 0.9 0.8  PROT 6.1* 6.0*  ALBUMIN 3.4* 3.3*   No results for input(s): LIPASE, AMYLASE in the last 72 hours. Cardiac Enzymes No results for input(s): CKTOTAL, CKMB, CKMBINDEX, TROPONINI in the last 72 hours.  BNP: BNP (last 3 results)  Recent Labs  05/25/17 0004  BNP 2,353.9*    ProBNP (last 3 results) No results for input(s): PROBNP in the last  8760 hours.   D-Dimer No results for input(s): DDIMER in the last 72 hours. Hemoglobin A1C No results for input(s): HGBA1C in the last 72 hours. Fasting Lipid Panel No results for input(s): CHOL, HDL, LDLCALC, TRIG, CHOLHDL, LDLDIRECT in the last 72 hours. Thyroid Function Tests No results for input(s): TSH, T4TOTAL, T3FREE, THYROIDAB in the last 72 hours.  Invalid input(s): FREET3  Other results:     Imaging/Studies:   No results found.    Medications:     Scheduled Medications: . amLODipine  5 mg Oral Daily  . aspirin  325 mg Oral Daily  . Chlorhexidine Gluconate Cloth  6 each Topical Q0600  . citalopram  10 mg Oral Daily  . doxycycline  100 mg Oral Q12H  . enoxaparin (LOVENOX) injection  40 mg Subcutaneous Q24H  . furosemide  80 mg Intravenous BID  . guaiFENesin-codeine  5 mL Oral Q12H  . ipratropium-albuterol  3 mL Nebulization TID  . losartan  25 mg Oral Daily  . methylPREDNISolone (SOLU-MEDROL) injection  60 mg Intravenous Q12H  . mometasone-formoterol  2 puff Inhalation BID  . mupirocin ointment  1 application Nasal BID  . potassium chloride  40 mEq  Oral Daily  . pravastatin  80 mg Oral Daily  . spironolactone  12.5 mg Oral Daily     Infusions:   PRN Medications:  acetaminophen **OR** acetaminophen, albuterol   Assessment/Plan   1. Acute on chronic systolic HF due to ICM s/p CABG 1998 with BiVICD.  - Echo this admit reviewed personally EF 20-25% with moderate RV HK and severe posterior MR - NYHA III at baseline. Now NYHA IV - Volume status much improved. Repeat IV lasix this am then transition to po.  - Plan RHC this afternoon if spot available.  - Will hold metoprolol for now with active wheezing. Can likely switch to bisoprolol in futtre - continue losartan 25 mg daily. Possible change to Entresto down the road - Increase spiro to 25 mg daily.   2. Acute hypoxic respiratory failure - Multifactorial due to COPD flare and ADHF - Continue nebs, steroids and abx - Long talk this admission about need to stop smoking (quit for 12 years but restarted last year). Reinforced today.  3. CKD 3 - Relatively stable creatinine at 1.49.  4. CAD s/p CABG 1998 patent grafts by cath in 2013 - no current ischemic symptoms 5. 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 Severe mitral regurgitation -Echo 05/26/17 reviewed. MR worse since previous.  - Likely combination of ischmic MR with restricted posterior leaflet and volume overload with dilated annulus. ? If it has gotten worse with more bivpacing (though this usually makes it better) - Would not consider MitraClip at this point. Will re-evaluate MR after full diuresis. No change.  7. PAD  --s/p AAA repair. No change.  8. Ongoing tobacco use - Encouraged cessation.   9. Hypokalemia  - Slightly improved but remains slightly low at 3.5. Supp ordered.  - Increase spironolactone 25 mg daily.   Mobilize. Consult PT.   Length of Stay: 3  Luane School  05/28/2017, 7:45 AM  Advanced Heart Failure Team Pager [phone] (M-F; 7a - 4p)   Please contact [facility] for night-coverage after hours (4p -7a ) and weekends on [email or web address]  Patient seen and examined with the above-signed Advanced Practice Provider and/or Housestaff. I personally reviewed laboratory data, imaging studies and relevant notes. I independently examined the patient  and formulated the important aspects of the plan. I have edited the note to reflect any of my changes or salient points. I have personally discussed the plan with the patient and/or family.  Volume status much improved. CVP now appears in 5-6 range. Still with active wheezing from COPD. Continue nebs, abx and steroids. Will plan RHC today to further evaluate filling pressures. Will need repeat echo to re-evaluate MR once he is compleely stable.   Arvilla Meres, MD  10:08 AM

## 2017-05-28 NOTE — Progress Notes (Signed)
Heart failure PA at bedside, aware of pt rising BUN States to give lasix and possible heart cath today  [name] [name] Elige Radon

## 2017-05-28 NOTE — Interval H&P Note (Signed)
History and Physical Interval Note:  05/28/2017 10:09 AM  [name] [name]  has presented today for surgery, with the diagnosis of hf  The various methods of treatment have been discussed with the patient and family. After consideration of risks, benefits and other options for treatment, the patient has consented to  Procedure(s): Right Heart Cath (N/A) as a surgical intervention .  The patient's history has been reviewed, patient examined, no change in status, stable for surgery.  I have reviewed the patient's chart and labs.  Questions were answered to the patient's satisfaction.     Debroah Shuttleworth, Reuel Boom

## 2017-05-28 NOTE — Progress Notes (Signed)
PROGRESS NOTE                                                                                                                                                                                                             Patient Demographics:    [name] [name], is a 76 y.o. male, DOB - 04-Feb-1941, AOZ:[id number]  Admit date - 05/24/2017   Admitting Physician Alberteen Sam, MD  Outpatient Primary MD for the patient is [name], MD  LOS - 3  Outpatient Specialists:Cardiology cardiology  Chief Complaint  Patient presents with  . Shortness of Breath       Brief Narrative   76 year old male with history of severe cardiomyopathy (EF of 25% with ICD/pacer), COPD not on home O2 with continues to use, chronic kidney disease stage III, CAD with history of CABG in 29, (s/p LAA closure with WATCHMAN in 05/2016 with AVN ablation in 08/2016), COPD, AAA (s/p repair in 12/2015 ), hypertension, hyperlipidemia who presented with acute COPD exacerbation and acute on chronic systolic CHF.    Subjective:   Feels his breathing to be better today. Still wheezing.   Assessment  & Plan :    Principal Problem:   COPD With acute exacerbation (HCC) Wheezing improved today. Continue scheduled Solu-Medrol, scheduled nebs. Continue doxycycline. Supportive care with Tylenol and antitussives. Counseled strongly on smoking cessation. Refused nicotine patch. (Wants to quit by himself)   Active Problems:  Acute on chronic combined systolic and diastolic CHF (congestive heart failure) (HCC) Diuresed better after Lasix dose increased. Echo done this admission with EF of 20-25% with severe posterior MR and RV hypokinesis. Right heart catheterization today. Metoprolol switched to bisoprolol due to active wheezing.. Lisinopril switched to losartan, possibly needs Entresto. . Increased Aldactone dose. Replenish potassium.  Chronic atrial  fibrillation. Status post AV node ablation in September 2017. LAA  closure with watchman June 2017. metoprolol switched to bisoprolol  Hypokalemia Continue supplement.( kcl 40 meq bid)   Severe mitral regurgitation Worsened MR on recent echo.  No need for MitrClip at present per cardiology. Plan to reevaluate MRI after diuresis.  CAD- CABG '98- cath 2007 and Aug 2013 On medical management.  Tachycardia-bradycardia syndrome (HCC) Status post ICD.  Chronic kidney disease stage III Stable.   Peripheral artery disease      Code Status : Full  code  Family Communication  : Daughter at bedside  Disposition Plan  : Pending hospital course  Barriers For Discharge : acitve symptoms  Consults  :   Heart failure  Procedures  :  Echo Right heart catheterization  DVT Prophylaxis  :  heaprin  Lab Results  Component Value Date   PLT 190 05/27/2017    Antibiotics  :    Anti-infectives    Start     Dose/Rate Route Frequency Ordered Stop   05/25/17 1000  doxycycline (VIBRA-TABS) tablet 100 mg     100 mg Oral Every 12 hours 05/25/17 0557          Objective:   Vitals:   05/28/17 1056 05/28/17 1101 05/28/17 1106 05/28/17 1121  BP: (!) 147/84 126/84 138/80 138/73  Pulse: 76 73 74 74  Resp: (!) 28 (!) 28 (!) 23 (!) 22  Temp:      TempSrc:    Oral  SpO2: 91% 90% 92% 94%  Weight:      Height:        Wt Readings from Last 3 Encounters:  05/28/17 81 kg (178 lb 8 oz)  04/10/17 81.6 kg (180 lb)  02/01/17 78.5 kg (173 lb)     Intake/Output Summary (Last 24 hours) at 05/28/17 1158 Last data filed at 05/28/17 1121  Gross per 24 hour  Intake              480 ml  Output             3600 ml  Net            -3120 ml     Physical Exam  Gen: not in distress HEENT: moist mucosa, supple neck, JVD+ Chest: Diffuse wheezing bilaterally(improved from yesterday) CVS: S1 and S2, systolic murmur 3/6 GI: soft, NT, ND,  Musculoskeletal: warm, no edema     Data Review:      CBC  Recent Labs Lab 05/25/17 0004 05/26/17 0302 05/27/17 0548  WBC 7.4 10.5 9.5  HGB 12.6* 12.9* 13.1  HCT 40.7 41.9 42.3  PLT 162 184 190  MCV 87.0 86.9 87.8  MCH 26.9 26.8 27.2  MCHC 31.0 30.8 31.0  RDW 17.5* 17.6* 17.7*  LYMPHSABS 0.7  --   --   MONOABS 0.5  --   --   EOSABS 0.3  --   --   BASOSABS 0.1  --   --     Chemistries   Recent Labs Lab 05/25/17 0004 05/25/17 0829 05/25/17 0917 05/26/17 0302 05/27/17 0548 05/28/17 0539  NA 141  --  142 145 144 142  K 2.8*  --  2.8* 3.9 3.3* 3.5  CL 107  --  105 106 105 97*  CO2 26  --  24 30 29 31   GLUCOSE 116*  --  270* 136* 128* 124*  BUN 19  --  20 28* 35* 41*  CREATININE 1.32*  --  1.41* 1.55* 1.41* 1.49*  CALCIUM 8.5*  --  8.5* 8.4* 8.2* 8.5*  MG  --  1.7 1.8  --   --  1.9  AST 21  --  28  --  19  --   ALT 19  --  20  --  20  --   ALKPHOS 69  --  75  --  72  --   BILITOT 1.2  --  0.9  --  0.8  --    ------------------------------------------------------------------------------------------------------------------ No results for input(s): CHOL, HDL, LDLCALC, TRIG,  CHOLHDL, LDLDIRECT in the last 72 hours.  Lab Results  Component Value Date   HGBA1C 5.5 12/24/2014   ------------------------------------------------------------------------------------------------------------------ No results for input(s): TSH, T4TOTAL, T3FREE, THYROIDAB in the last 72 hours.  Invalid input(s): FREET3 ------------------------------------------------------------------------------------------------------------------ No results for input(s): VITAMINB12, FOLATE, FERRITIN, TIBC, IRON, RETICCTPCT in the last 72 hours.  Coagulation profile No results for input(s): INR, PROTIME in the last 168 hours.  No results for input(s): DDIMER in the last 72 hours.  Cardiac Enzymes No results for input(s): CKMB, TROPONINI, MYOGLOBIN in the last 168 hours.  Invalid input(s):  CK ------------------------------------------------------------------------------------------------------------------    Component Value Date/Time   BNP 2,353.9 (H) 05/25/2017 0004   BNP 1,794.7 (H) 03/28/2016 1017    Inpatient Medications  Scheduled Meds: . amLODipine  5 mg Oral Daily  . aspirin  325 mg Oral Daily  . Chlorhexidine Gluconate Cloth  6 each Topical Q0600  . citalopram  10 mg Oral Daily  . doxycycline  100 mg Oral Q12H  . enoxaparin (LOVENOX) injection  40 mg Subcutaneous Q24H  . guaiFENesin-codeine  5 mL Oral Q12H  . ipratropium-albuterol  3 mL Nebulization TID  . losartan  25 mg Oral Daily  . methylPREDNISolone (SOLU-MEDROL) injection  60 mg Intravenous Q12H  . mometasone-formoterol  2 puff Inhalation BID  . mupirocin ointment  1 application Nasal BID  . potassium chloride  40 mEq Oral BID  . pravastatin  80 mg Oral Daily  . spironolactone  25 mg Oral Daily   Continuous Infusions: PRN Meds:.acetaminophen **OR** acetaminophen, albuterol  Micro Results Recent Results (from the past 240 hour(s))  Respiratory Panel by PCR     Status: None   Collection Time: 05/25/17  5:18 AM  Result Value Ref Range Status   Adenovirus NOT DETECTED NOT DETECTED Final   Coronavirus 229E NOT DETECTED NOT DETECTED Final   Coronavirus HKU1 NOT DETECTED NOT DETECTED Final   Coronavirus NL63 NOT DETECTED NOT DETECTED Final   Coronavirus OC43 NOT DETECTED NOT DETECTED Final   Metapneumovirus NOT DETECTED NOT DETECTED Final   Rhinovirus / Enterovirus NOT DETECTED NOT DETECTED Final   Influenza A NOT DETECTED NOT DETECTED Final   Influenza B NOT DETECTED NOT DETECTED Final   Parainfluenza Virus 1 NOT DETECTED NOT DETECTED Final   Parainfluenza Virus 2 NOT DETECTED NOT DETECTED Final   Parainfluenza Virus 3 NOT DETECTED NOT DETECTED Final   Parainfluenza Virus 4 NOT DETECTED NOT DETECTED Final   Respiratory Syncytial Virus NOT DETECTED NOT DETECTED Final   Bordetella pertussis NOT  DETECTED NOT DETECTED Final   Chlamydophila pneumoniae NOT DETECTED NOT DETECTED Final   Mycoplasma pneumoniae NOT DETECTED NOT DETECTED Final  MRSA PCR Screening     Status: Abnormal   Collection Time: 05/25/17  6:05 AM  Result Value Ref Range Status   MRSA by PCR POSITIVE (A) NEGATIVE Final    Comment:        The GeneXpert MRSA Assay (FDA approved for NASAL specimens only), is one component of a comprehensive MRSA colonization surveillance program. It is not intended to diagnose MRSA infection nor to guide or monitor treatment for MRSA infections. RESULT CALLED TO, READ BACK BY AND VERIFIED WITH: L MUELLER 05/25/17 @ 0839 Annye Asa     Radiology Reports Dg Chest 2 View  Result Date: 05/25/2017 CLINICAL DATA:  Shortness of breath EXAM: CHEST  2 VIEW COMPARISON:  03/12/2016 FINDINGS: Left-sided pacing device as before. Post sternotomy changes. Vascular occlusive device on lateral view. Cardiomegaly with  central vascular congestion. No focal infiltrate or effusion. Aortic atherosclerosis. Tiny bilateral effusions. No pneumothorax. Vertebral augmentation changes at the upper lumbar spine. Increased interstitial opacity at the lung bases. IMPRESSION: 1. Cardiomegaly with mild central congestion. No edema or infiltrate. 2. Increased interstitial opacity at the lung bases could relate to bronchial inflammation. Electronically Signed   By: Jasmine Pang M.D.   On: 05/25/2017 00:00    Time Spent in minutes 25   Eddie North M.D on 05/28/2017 at 11:58 AM  Between 7am to 7pm - Pager - [phone]  After 7pm go to [email or web address] - password [email or web address]  [facility] -  Office  [phone]

## 2017-05-28 NOTE — Progress Notes (Signed)
PT Cancellation Note  Patient Details Name: [name] [name] MRN: [id number] DOB: 1941/03/18   Cancelled Treatment:    Reason Eval/Treat Not Completed: Patient at procedure or test/unavailable (cath lab)   Fabio Asa 05/28/2017, 10:11 AM

## 2017-05-28 NOTE — Progress Notes (Signed)
Cath lab called and stated they are coming to take pt for cardiac cath  Pt signed consent and Asprin given  Informed that pt ate breakfast this morning, MD stated that is okay  [name] [name]

## 2017-05-28 NOTE — H&P (View-Only) (Signed)
  Advanced Heart Failure Rounding Note  Primary Cardiologist: Crenshaw  Subjective:    Creatinine 1.49. K 3.5   Feeling better today. Still SOB with exertion and still wheezing. Leg swelling improved.   On lasix 80 mg IV BID and negative 2.8 L and weight down 6 lbs overnight.   Objective:   Weight Range: 178 lb 8 oz (81 kg) Body mass index is 23.55 kg/m.   Vital Signs:   Temp:  [98.5 F (36.9 C)-99 F (37.2 C)] 99 F (37.2 C) (06/18 0541) Pulse Rate:  [66-100] 100 (06/18 0541) Resp:  [18] 18 (06/18 0541) BP: (145-158)/(76-90) 145/90 (06/18 0541) SpO2:  [92 %-98 %] 98 % (06/18 0541) Weight:  [178 lb 8 oz (81 kg)] 178 lb 8 oz (81 kg) (06/18 0541) Last BM Date: 05/27/17  Weight change: Filed Weights   05/26/17 0505 05/27/17 0622 05/28/17 0541  Weight: 182 lb 9.6 oz (82.8 kg) 184 lb 4.8 oz (83.6 kg) 178 lb 8 oz (81 kg)    Intake/Output:   Intake/Output Summary (Last 24 hours) at 05/28/17 0745 Last data filed at 05/28/17 0630  Gross per 24 hour  Intake              480 ml  Output             3350 ml  Net            -2870 ml     Physical Exam: General:  Elderly appearing.  + cough with audible wheeze.  HEENT: Normal Neck: supple. JVP ~6-7 with mild HJR. Carotids 2+ bilat; no bruits. No lymphadenopathy or thyromegaly appreciated. Cor: PMI nondisplaced. Regular rate & rhythm. 2/6 MR Lungs: Limited air movement, + mild active wheezing.  Abdomen: soft, nontender, nondistended. No hepatosplenomegaly. No bruits or masses. Good bowel sounds. Extremities: no cyanosis, clubbing, rash, or edema.  Neuro: alert & orientedx3, cranial nerves grossly intact. moves all 4 extremities w/o difficulty. Affect pleasant  Telemetry: Personally reviewed, AF with BiV pacing.   Labs: CBC  Recent Labs  05/26/17 0302 05/27/17 0548  WBC 10.5 9.5  HGB 12.9* 13.1  HCT 41.9 42.3  MCV 86.9 87.8  PLT 184 190   Basic Metabolic Panel  Recent Labs  05/25/17 0829  05/25/17 0917  05/26/17 0302 05/27/17 0548  NA  --   < > 142 145 144  K  --   < > 2.8* 3.9 3.3*  CL  --   < > 105 106 105  CO2  --   < > 24 30 29  GLUCOSE  --   < > 270* 136* 128*  BUN  --   < > 20 28* 35*  CREATININE  --   < > 1.41* 1.55* 1.41*  CALCIUM  --   < > 8.5* 8.4* 8.2*  MG 1.7  --  1.8  --   --   < > = values in this interval not displayed. Liver Function Tests  Recent Labs  05/25/17 0917 05/27/17 0548  AST 28 19  ALT 20 20  ALKPHOS 75 72  BILITOT 0.9 0.8  PROT 6.1* 6.0*  ALBUMIN 3.4* 3.3*   No results for input(s): LIPASE, AMYLASE in the last 72 hours. Cardiac Enzymes No results for input(s): CKTOTAL, CKMB, CKMBINDEX, TROPONINI in the last 72 hours.  BNP: BNP (last 3 results)  Recent Labs  05/25/17 0004  BNP 2,353.9*    ProBNP (last 3 results) No results for input(s): PROBNP in the last   8760 hours.   D-Dimer No results for input(s): DDIMER in the last 72 hours. Hemoglobin A1C No results for input(s): HGBA1C in the last 72 hours. Fasting Lipid Panel No results for input(s): CHOL, HDL, LDLCALC, TRIG, CHOLHDL, LDLDIRECT in the last 72 hours. Thyroid Function Tests No results for input(s): TSH, T4TOTAL, T3FREE, THYROIDAB in the last 72 hours.  Invalid input(s): FREET3  Other results:     Imaging/Studies:   No results found.    Medications:     Scheduled Medications: . amLODipine  5 mg Oral Daily  . aspirin  325 mg Oral Daily  . Chlorhexidine Gluconate Cloth  6 each Topical Q0600  . citalopram  10 mg Oral Daily  . doxycycline  100 mg Oral Q12H  . enoxaparin (LOVENOX) injection  40 mg Subcutaneous Q24H  . furosemide  80 mg Intravenous BID  . guaiFENesin-codeine  5 mL Oral Q12H  . ipratropium-albuterol  3 mL Nebulization TID  . losartan  25 mg Oral Daily  . methylPREDNISolone (SOLU-MEDROL) injection  60 mg Intravenous Q12H  . mometasone-formoterol  2 puff Inhalation BID  . mupirocin ointment  1 application Nasal BID  . potassium chloride  40 mEq  Oral Daily  . pravastatin  80 mg Oral Daily  . spironolactone  12.5 mg Oral Daily     Infusions:   PRN Medications:  acetaminophen **OR** acetaminophen, albuterol   Assessment/Plan   1. Acute on chronic systolic HF due to ICM s/p CABG 1998 with BiVICD.  - Echo this admit reviewed personally EF 20-25% with moderate RV HK and severe posterior MR - NYHA III at baseline. Now NYHA IV - Volume status much improved. Repeat IV lasix this am then transition to po.  - Plan RHC this afternoon if spot available.  - Will hold metoprolol for now with active wheezing. Can likely switch to bisoprolol in futtre - continue losartan 25 mg daily. Possible change to Entresto down the road - Increase spiro to 25 mg daily.   2. Acute hypoxic respiratory failure - Multifactorial due to COPD flare and ADHF - Continue nebs, steroids and abx - Long talk this admission about need to stop smoking (quit for 12 years but restarted last year). Reinforced today.  3. CKD 3 - Relatively stable creatinine at 1.49.  4. CAD s/p CABG 1998 patent grafts by cath in 2013 - no current ischemic symptoms 5. 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 Severe mitral regurgitation -Echo 05/26/17 reviewed. MR worse since previous.  - Likely combination of ischmic MR with restricted posterior leaflet and volume overload with dilated annulus. ? If it has gotten worse with more bivpacing (though this usually makes it better) - Would not consider MitraClip at this point. Will re-evaluate MR after full diuresis. No change.  7. PAD  --s/p AAA repair. No change.  8. Ongoing tobacco use - Encouraged cessation.   9. Hypokalemia  - Slightly improved but remains slightly low at 3.5. Supp ordered.  - Increase spironolactone 25 mg daily.   Mobilize. Consult PT.   Length of Stay: 3  Michael Andrew Tillery, PA-C  05/28/2017, 7:45 AM  Advanced Heart Failure Team Pager [phone] (M-F; 7a - 4p)   Please contact [facility] for night-coverage after hours (4p -7a ) and weekends on [email or web address]  Patient seen and examined with the above-signed Advanced Practice Provider and/or Housestaff. I personally reviewed laboratory data, imaging studies and relevant notes. I independently examined the patient   and formulated the important aspects of the plan. I have edited the note to reflect any of my changes or salient points. I have personally discussed the plan with the patient and/or family.  Volume status much improved. CVP now appears in 5-6 range. Still with active wheezing from COPD. Continue nebs, abx and steroids. Will plan RHC today to further evaluate filling pressures. Will need repeat echo to re-evaluate MR once he is compleely stable.   Murad Staples, MD  10:08 AM    

## 2017-05-28 NOTE — Progress Notes (Signed)
Patient arrived to the unit alert and oriented post cath. Vital signs stable. No complaints of pain/discomfort. Post cath bedrest instructions given to the patient. Right groin dressing clean, dry, and intact. Clean dressing to right AC. Patient resting comfortably at this time.

## 2017-05-29 ENCOUNTER — Inpatient Hospital Stay (HOSPITAL_COMMUNITY): Payer: Medicare Other

## 2017-05-29 DIAGNOSIS — F17200 Nicotine dependence, unspecified, uncomplicated: Secondary | ICD-10-CM | POA: Diagnosis present

## 2017-05-29 DIAGNOSIS — I360 Nonrheumatic tricuspid (valve) stenosis: Secondary | ICD-10-CM

## 2017-05-29 DIAGNOSIS — J9601 Acute respiratory failure with hypoxia: Secondary | ICD-10-CM

## 2017-05-29 LAB — BASIC METABOLIC PANEL
ANION GAP: 12 (ref 5–15)
BUN: 48 mg/dL — ABNORMAL HIGH (ref 6–20)
CALCIUM: 8.4 mg/dL — AB (ref 8.9–10.3)
CHLORIDE: 98 mmol/L — AB (ref 101–111)
CO2: 33 mmol/L — AB (ref 22–32)
Creatinine, Ser: 1.59 mg/dL — ABNORMAL HIGH (ref 0.61–1.24)
GFR calc Af Amer: 48 mL/min — ABNORMAL LOW (ref >60)
GFR calc non Af Amer: 41 mL/min — ABNORMAL LOW (ref >60)
GLUCOSE: 144 mg/dL — AB (ref 65–99)
Potassium: 3.1 mmol/L — ABNORMAL LOW (ref 3.5–5.1)
Sodium: 143 mmol/L (ref 135–145)

## 2017-05-29 LAB — ECHOCARDIOGRAM LIMITED
Height: 73 in
Weight: 2830.4 oz

## 2017-05-29 MED ORDER — PREDNISONE 20 MG PO TABS: 20.0000 mg | ORAL_TABLET | Freq: Every day | ORAL | 0 refills | Status: DC

## 2017-05-29 MED ORDER — ALBUTEROL SULFATE HFA 108 (90 BASE) MCG/ACT IN AERS: 2.0000 | INHALATION_SPRAY | Freq: Four times a day (QID) | RESPIRATORY_TRACT | 2 refills | Status: DC | PRN

## 2017-05-29 MED ORDER — SPIRONOLACTONE 25 MG PO TABS: 25.0000 mg | ORAL_TABLET | Freq: Every day | ORAL | 0 refills | Status: DC

## 2017-05-29 MED ORDER — FUROSEMIDE 40 MG PO TABS
40.0000 mg | ORAL_TABLET | Freq: Two times a day (BID) | ORAL | Status: DC
  Administered 2017-05-29: 40 mg via ORAL
  Filled 2017-05-29: qty 1

## 2017-05-29 MED ORDER — BISOPROLOL FUMARATE 5 MG PO TABS
2.5000 mg | ORAL_TABLET | Freq: Every day | ORAL | Status: DC
  Administered 2017-05-29: 2.5 mg via ORAL
  Filled 2017-05-29: qty 1

## 2017-05-29 MED ORDER — AMLODIPINE BESYLATE 10 MG PO TABS: 5.0000 mg | ORAL_TABLET | Freq: Every day | ORAL | 0 refills | Status: DC

## 2017-05-29 MED ORDER — FUROSEMIDE 40 MG PO TABS: 40.0000 mg | ORAL_TABLET | Freq: Every day | ORAL | Status: DC

## 2017-05-29 MED ORDER — LOSARTAN POTASSIUM 25 MG PO TABS: 25.0000 mg | ORAL_TABLET | Freq: Every day | ORAL | 0 refills | Status: DC

## 2017-05-29 MED ORDER — BISOPROLOL FUMARATE 5 MG PO TABS: 2.5000 mg | ORAL_TABLET | Freq: Every day | ORAL | 0 refills | Status: DC

## 2017-05-29 MED ORDER — MOMETASONE FURO-FORMOTEROL FUM 200-5 MCG/ACT IN AERO: 2.0000 | INHALATION_SPRAY | Freq: Two times a day (BID) | RESPIRATORY_TRACT | 0 refills | Status: DC

## 2017-05-29 MED ORDER — TIOTROPIUM BROMIDE MONOHYDRATE 18 MCG IN CAPS: 18.0000 ug | ORAL_CAPSULE | Freq: Every day | RESPIRATORY_TRACT | 2 refills | Status: DC

## 2017-05-29 MED ORDER — FUROSEMIDE 20 MG PO TABS: 40.0000 mg | ORAL_TABLET | Freq: Every day | ORAL | 0 refills | Status: DC

## 2017-05-29 NOTE — Progress Notes (Signed)
  Echocardiogram 2D Echocardiogram has been performed.  Garnette Greb T Pilar Westergaard 05/29/2017, 12:31 PM

## 2017-05-29 NOTE — Progress Notes (Signed)
SATURATION QUALIFICATIONS: (This note is used to comply with regulatory documentation for home oxygen)  Patient Saturations on Room Air at Rest = 96%  Patient Saturations on Room Air while Ambulating = 93%  Patient Saturations on 2 Liters of oxygen while Ambulating = 94%  Please briefly explain why patient needs home oxygen:

## 2017-05-29 NOTE — Progress Notes (Signed)
Progress Note  Patient Name: [name] [name] Date of Encounter: 05/29/2017  Primary Cardiologist: Dr [name]  Subjective   Mild dyspnea but much improved  Inpatient Medications    Scheduled Meds: . amLODipine  5 mg Oral Daily  . aspirin  325 mg Oral Daily  . Chlorhexidine Gluconate Cloth  6 each Topical Q0600  . citalopram  10 mg Oral Daily  . doxycycline  100 mg Oral Q12H  . enoxaparin (LOVENOX) injection  40 mg Subcutaneous Q24H  . furosemide  40 mg Oral BID  . guaiFENesin-codeine  5 mL Oral Q12H  . ipratropium-albuterol  3 mL Nebulization TID  . losartan  25 mg Oral Daily  . methylPREDNISolone (SOLU-MEDROL) injection  60 mg Intravenous Q12H  . mometasone-formoterol  2 puff Inhalation BID  . mupirocin ointment  1 application Nasal BID  . potassium chloride  40 mEq Oral BID  . pravastatin  80 mg Oral Daily  . sodium chloride flush  3 mL Intravenous Q12H  . spironolactone  25 mg Oral Daily   Continuous Infusions: . sodium chloride     PRN Meds: sodium chloride, acetaminophen **OR** acetaminophen, albuterol, ondansetron (ZOFRAN) IV, sodium chloride flush   Vital Signs    Vitals:   05/29/17 0248 05/29/17 0500 05/29/17 0726 05/29/17 0727  BP:  134/79    Pulse:  64    Resp:  18    Temp:  98.2 F (36.8 C)    TempSrc:  Oral    SpO2:  96% 96% 96%  Weight: 80.2 kg (176 lb 14.4 oz)     Height:        Intake/Output Summary (Last 24 hours) at 05/29/17 0936 Last data filed at 05/29/17 0247  Gross per 24 hour  Intake              480 ml  Output             1550 ml  Net            -1070 ml   Filed Weights   05/27/17 0622 05/28/17 0541 05/29/17 0248  Weight: 83.6 kg (184 lb 4.8 oz) 81 kg (178 lb 8 oz) 80.2 kg (176 lb 14.4 oz)    Telemetry    VPacing with NSVT- Personally Reviewed  Physical Exam   GEN: No acute distress.   Neck: Supple Cardiac: RRR Respiratory: Diminished BS throughout GI: Soft, nontender, non-distended  MS: No edema Neuro:  Nonfocal   Psych: Normal affect   Labs    Chemistry Recent Labs Lab 05/25/17 0004 05/25/17 0917  05/27/17 0548 05/28/17 0539 05/29/17 0229  NA 141 142  < > 144 142 143  K 2.8* 2.8*  < > 3.3* 3.5 3.1*  CL 107 105  < > 105 97* 98*  CO2 26 24  < > 29 31 33*  GLUCOSE 116* 270*  < > 128* 124* 144*  BUN 19 20  < > 35* 41* 48*  CREATININE 1.32* 1.41*  < > 1.41* 1.49* 1.59*  CALCIUM 8.5* 8.5*  < > 8.2* 8.5* 8.4*  PROT 5.8* 6.1*  --  6.0*  --   --   ALBUMIN 3.4* 3.4*  --  3.3*  --   --   AST 21 28  --  19  --   --   ALT 19 20  --  20  --   --   ALKPHOS 69 75  --  72  --   --  BILITOT 1.2 0.9  --  0.8  --   --   GFRNONAA 51* 48*  < > 48* 44* 41*  GFRAA 60* 55*  < > 55* 52* 48*  ANIONGAP 8 13  < > 10 14 12   < > = values in this interval not displayed.   Hematology Recent Labs Lab 05/25/17 0004 05/26/17 0302 05/27/17 0548  WBC 7.4 10.5 9.5  RBC 4.68 4.82 4.82  HGB 12.6* 12.9* 13.1  HCT 40.7 41.9 42.3  MCV 87.0 86.9 87.8  MCH 26.9 26.8 27.2  MCHC 31.0 30.8 31.0  RDW 17.5* 17.6* 17.7*  PLT 162 184 190     Recent Labs Lab 05/25/17 0017  TROPIPOC 0.01     BNP Recent Labs Lab 05/25/17 0004  BNP 2,353.9*     Patient Profile     76 y.o. male coronary artery disease status post coronary artery bypass and graft, atrial fibrillation status post Watchman device, ischemic cardiomyopathy, COPD, renal insufficiency admitted with acute on chronic systolic congestive heart failure and COPD flare.  Assessment & Plan    1 acute on chronic systolic congestive heart failure-ejection fraction 20-25% with severe mitral regurgitation based on echocardiogram. He is now euvolemic on examination with pulmonary capillary wedge pressure of 12 by cardiac catheterization yesterday. Renal function mildly decreased. I will change Lasix to 40 mg daily. He was taking 60 mg every other day at home. Continue spironolactone 25 mg daily. Patient can be discharged from a cardiac standpoint. Check potassium  and renal function in 1 week.  2 ischemic cardiomyopathy-continue ARB. Will change to entresto as outpt. Add bisoprolol 2.5 mg daily.  3 tobacco abuse-patient has resumed smoking after discontinuing for 14 years. I again strongly recommended discontinuing.  4 acute on chronic stage III kidney disease-likely due to mild overdiuresis. Adjust diuretics as outlined above. Check potassium and renal function in 1 week.  5 permanent atrial fibrillation-patient is status post watchman device. He is also status post AV node ablation.  6 severe mitral regurgitation-we will plan to repeat echocardiogram in 4-6 weeks. Possible consideration of mitraclip in the future.  7 COPD-further management per primary service.   8 Hypokalemia-supplement  9 Coronary artery disease-continue aspirin and statin.   10 dilated aortic root-noted on echocardiogram. We will reassess as an outpatient.  Signed, Olga Millers, MD  05/29/2017, 9:36 AM

## 2017-05-29 NOTE — Discharge Instructions (Signed)
Heart Failure °Heart failure is a condition in which the heart has trouble pumping blood because it has become weak or stiff. This means that the heart does not pump blood efficiently for the body to work well. For some people with heart failure, fluid may back up into the lungs and there may be swelling (edema) in the lower legs. Heart failure is usually a long-term (chronic) condition. It is important for you to take good care of yourself and follow the treatment plan from your health care provider. °What are the causes? °This condition is caused by some health problems, including: °· High blood pressure (hypertension). Hypertension causes the heart muscle to work harder than normal. High blood pressure eventually causes the heart to become stiff and weak. °· Coronary artery disease (CAD). CAD is the buildup of cholesterol and fat (plaques) in the arteries of the heart. °· Heart attack (myocardial infarction). Injured tissue, which is caused by the heart attack, does not contract as well and the heart's ability to pump blood is weakened. °· Abnormal heart valves. When the heart valves do not open and close properly, the heart muscle must pump harder to keep the blood flowing. °· Heart muscle disease (cardiomyopathy or myocarditis). Heart muscle disease is damage to the heart muscle from a variety of causes, such as drug or alcohol abuse, infections, or unknown causes. These can increase the risk of heart failure. °· Lung disease. When the lungs do not work properly, the heart must work harder. ° °What increases the risk? °Risk of heart failure increases as a person ages. This condition is also more likely to develop in people who: °· Are overweight. °· Are male. °· Smoke or chew tobacco. °· Abuse alcohol or illegal drugs. °· Have taken medicines that can damage the heart, such as chemotherapy drugs. °· Have diabetes. °? High blood sugar (glucose) is associated with high fat (lipid) levels in the blood. °? Diabetes  can also damage tiny blood vessels that carry nutrients to the heart muscle. °· Have abnormal heart rhythms. °· Have thyroid problems. °· Have low blood counts (anemia). ° °What are the signs or symptoms? °Symptoms of this condition include: °· Shortness of breath with activity, such as when climbing stairs. °· Persistent cough. °· Swelling of the feet, ankles, legs, or abdomen. °· Unexplained weight gain. °· Difficulty breathing when lying flat (orthopnea). °· Waking from sleep because of the need to sit up and get more air. °· Rapid heartbeat. °· Fatigue and loss of energy. °· Feeling light-headed, dizzy, or close to fainting. °· Loss of appetite. °· Nausea. °· Increased urination during the night (nocturia). °· Confusion. ° °How is this diagnosed? °This condition is diagnosed based on: °· Medical history, symptoms, and a physical exam. °· Diagnostic tests, which may include: °? Echocardiogram. °? Electrocardiogram (ECG). °? Chest X-ray. °? Blood tests. °? Exercise stress test. °? Radionuclide scans. °? Cardiac catheterization and angiogram. ° °How is this treated? °Treatment for this condition is aimed at managing the symptoms of heart failure. Medicines, behavioral changes, or other treatments may be necessary to treat heart failure. °Medicines °These may include: °· Angiotensin-converting enzyme (ACE) inhibitors. This type of medicine blocks the effects of a blood protein called angiotensin-converting enzyme. ACE inhibitors relax (dilate) the blood vessels and help to lower blood pressure. °· Angiotensin receptor blockers (ARBs). This type of medicine blocks the actions of a blood protein called angiotensin. ARBs dilate the blood vessels and help to lower blood pressure. °· Water   pills (diuretics). Diuretics cause the kidneys to remove salt and water from the blood. The extra fluid is removed through urination, leaving a lower volume of blood that the heart has to pump. °· Beta blockers. These improve heart  muscle strength and they prevent the heart from beating too quickly. °· Digoxin. This increases the force of the heartbeat. ° °Healthy behavior changes °These may include: °· Reaching and maintaining a healthy weight. °· Stopping smoking or chewing tobacco. °· Eating heart-healthy foods. °· Limiting or avoiding alcohol. °· Stopping use of street drugs (illegal drugs). °· Physical activity. ° °Other treatments °These may include: °· Surgery to open blocked coronary arteries or repair damaged heart valves. °· Placement of a biventricular pacemaker to improve heart muscle function (cardiac resynchronization therapy). This device paces both the right ventricle and left ventricle. °· Placement of a device to treat serious abnormal heart rhythms (implantable cardioverter defibrillator, or ICD). °· Placement of a device to improve the pumping ability of the heart (left ventricular assist device, or LVAD). °· Heart transplant. This can cure heart failure, and it is considered for certain patients who do not improve with other therapies. ° °Follow these instructions at home: °Medicines °· Take over-the-counter and prescription medicines only as told by your health care provider. Medicines are important in reducing the workload of your heart, slowing the progression of heart failure, and improving your symptoms. °? Do not stop taking your medicine unless your health care provider told you to do that. °? Do not skip any dose of medicine. °? Refill your prescriptions before you run out of medicine. You need your medicines every day. °Eating and drinking ° °· Eat heart-healthy foods. Talk with a dietitian to make an eating plan that is right for you. °? Choose foods that contain no trans fat and are low in saturated fat and cholesterol. Healthy choices include fresh or frozen fruits and vegetables, fish, lean meats, legumes, fat-free or low-fat dairy products, and whole-grain or high-fiber foods. °? Limit salt (sodium) if  directed by your health care provider. Sodium restriction may reduce symptoms of heart failure. Ask a dietitian to recommend heart-healthy seasonings. °? Use healthy cooking methods instead of frying. Healthy methods include roasting, grilling, broiling, baking, poaching, steaming, and stir-frying. °· Limit your fluid intake if directed by your health care provider. Fluid restriction may reduce symptoms of heart failure. °Lifestyle °· Stop smoking or using chewing tobacco. Nicotine and tobacco can damage your heart and your blood vessels. Do not use nicotine gum or patches before talking to your health care provider. °· Limit alcohol intake to no more than 1 drink per day for non-pregnant women and 2 drinks per day for men. One drink equals 12 oz of beer, 5 oz of wine, or 1½ oz of hard liquor. °? Drinking more than that is harmful to your heart. Tell your health care provider if you drink alcohol several times a week. °? Talk with your health care provider about whether any level of alcohol use is safe for you. °? If your heart has already been damaged by alcohol or you have severe heart failure, drinking alcohol should be stopped completely. °· Stop use of illegal drugs. °· Lose weight if directed by your health care provider. Weight loss may reduce symptoms of heart failure. °· Do moderate physical activity if directed by your health care provider. People who are elderly and people with severe heart failure should consult with a health care provider for physical activity recommendations. °  Monitor important information °· Weigh yourself every day. Keeping track of your weight daily helps you to notice excess fluid sooner. °? Weigh yourself every morning after you urinate and before you eat breakfast. °? Wear the same amount of clothing each time you weigh yourself. °? Record your daily weight. Provide your health care provider with your weight record. °· Monitor and record your blood pressure as told by your health  care provider. °· Check your pulse as told by your health care provider. °Dealing with extreme temperatures °· If the weather is extremely hot: °? Avoid vigorous physical activity. °? Use air conditioning or fans or seek a cooler location. °? Avoid caffeine and alcohol. °? Wear loose-fitting, lightweight, and light-colored clothing. °· If the weather is extremely cold: °? Avoid vigorous physical activity. °? Layer your clothes. °? Wear mittens or gloves, a hat, and a scarf when you go outside. °? Avoid alcohol. °General instructions °· Manage other health conditions such as hypertension, diabetes, thyroid disease, or abnormal heart rhythms as told by your health care provider. °· Learn to manage stress. If you need help to do this, ask your health care provider. °· Plan rest periods when fatigued. °· Get ongoing education and support as needed. °· Participate in or seek rehabilitation as needed to maintain or improve independence and quality of life. °· Stay up to date with immunizations. Keeping current on pneumococcal and influenza immunizations is especially important to prevent respiratory infections. °· Keep all follow-up visits as told by your health care provider. This is important. °Contact a health care provider if: °· You have a rapid weight gain. °· You have increasing shortness of breath that is unusual for you. °· You are unable to participate in your usual physical activities. °· You tire easily. °· You cough more than normal, especially with physical activity. °· You have any swelling or more swelling in areas such as your hands, feet, ankles, or abdomen. °· You are unable to sleep because it is hard to breathe. °· You feel like your heart is beating quickly (palpitations). °· You become dizzy or light-headed when you stand up. °Get help right away if: °· You have difficulty breathing. °· You notice or your family notices a change in your awareness, such as having trouble staying awake or having  difficulty with concentration. °· You have pain or discomfort in your chest. °· You have an episode of fainting (syncope). °This information is not intended to replace advice given to you by your health care provider. Make sure you discuss any questions you have with your health care provider. °Document Released: 11/27/2005 Document Revised: 08/01/2016 Document Reviewed: 06/21/2016 °[employer] Interactive Patient Education © 2017 [employer] ° °

## 2017-05-29 NOTE — Progress Notes (Addendum)
Noted order for home oxygen; awaiting on qualifying oxygen saturation before oxygen can be ordered; patient's nurse made aware. Abelino [name] W[place],SHU,OHF [phone]  Per saturations documented, patient does not qualify for home oxygen at this time. Medicare will not pay for home oxygen without qualifying oxygen saturations.Abelino [name] Rn,MHA,BSN [phone]

## 2017-05-29 NOTE — Discharge Summary (Addendum)
Physician Discharge Summary  [name] [name]:[id number] DOB: [birth year] DOA: 05/24/2017  PCP: [name], MD  Admit date: 05/24/2017 Discharge date: 05/29/2017  Admitted From: Home Disposition:  Home  Recommendations for Outpatient Follow-up:  1. Follow up with PCP in 1-2 weeks 2. Cardiology will call for follow-up appointment in 1 week 3.  Patient should followup with his pulmonologist Dr. Delton Coombes 4 weeks.  Home Health: None Equipment/Devices: None  Discharge Condition: Stable CODE STATUS: Full code Diet recommendation: Heart Healthy    Discharge Diagnoses:  Principal Problem:   COPD exacerbation (HCC)   Active Problems:   Acute on chronic combined systolic and diastolic CHF (congestive heart failure) (HCC)   CAD- CABG '98- cath 2007 and Aug 2013- Med Rx   Cardiomyopathy, ischemic-EF 25%    Permanent atrial fibrillation (HCC)   Automatic implantable cardioverter-defibrillator in situ   CKD (chronic kidney disease), stage III   Tachycardia-bradycardia syndrome (HCC)   Hypokalemia   Tobacco use disorder   brief narrative/history of present illness 76 year old male with history of severe cardiomyopathy (EF of 25% with ICD/pacer), COPD not on home O2 with continues to use, chronic kidney disease stage III, CAD with history of CABG in 50, (s/p LAA closure with WATCHMAN in 05/2016 with AVN ablation in 08/2016), COPD, AAA (s/p repair in 12/2015 ), hypertension, hyperlipidemia who presented with acute COPD exacerbation and acute on chronic systolic CHF.  Hospital course Principal Problem:   COPD With acute exacerbation (HCC) Wheezing improved today. Treated with 5 days of oral doxycycline. Transition Solu-Medrol to oral prednisone taper over the next 2 days. Patient only takes albuterol nebulizer as needed at home and does not have any inhalers. (Reported being on Spiriva but stopped taking it). I would prescribe him albuterol inhaler, daily Spiriva and Dulera a  twice a day. He should follow-up with his pulmonologist Dr. Delton Coombes in 4 weeks.  Counseled strongly on smoking cessation.  patient had quit smoking for almost 14 years and  R now went back to smoking.efused nicotine patch. (Wants to quit by himself) sats >90% on both room air and ambulation. Does not need home O2.     Active Problems:  Acute on chronic combined systolic and diastolic CHF (congestive heart failure) (HCC) Diuresed better after Lasix dose increased. Echo done this admission with EF of 20-25% with severe posterior MR and RV hypokinesis. Right heart catheterization done on 6/18 shows mild pulmonary hypertension and PCWP of 12. Metoprolol switched to bisoprolol due to active wheezing.. Lisinopril switched to losartan, possibly needs Entresto as outpatient. . Increased Aldactone dose.  Acute on chronic kidney disease stage III Mild worsening of function today. Lasix dose has been reduced. Patient should have his renal function checked within 1 week since he is being discharged on ARB and Aldactone.  Chronic atrial fibrillation. Status post AV node ablation in September 2017. LAA  closure with watchman June 2017. metoprolol switched to bisoprolol.  Hypokalemia Continue potassium supplement .   Severe mitral regurgitation Worsened MR on recent echo.   was diuresed well this admission. Plan to repeat echo in 4-6 weeks. No need for MitrClip at present per cardiology.   CAD- CABG '98- cath 2007 and Aug 2013 On medical management.  Tachycardia-bradycardia syndrome (HCC) Status post ICD.  Chronic kidney disease stage III Stable.   Peripheral artery disease Continue aspirin.  Dilated aortic root Seen on echo. Plan to reassess as outpatient.     Code Status : Full code  Family Communication  :  Daughter at bedside  Disposition Plan  :  home  Consults  :   Heart failure  Procedures  :  Echo Right heart catheterization   Discharge  Instructions   Allergies as of 05/29/2017      Reactions   Buprenorphine Hcl Other (See Comments)   Patient stated that he did not do well on this medication. He felt like climbing the walls   Morphine And Related Other (See Comments)   Patient stated that he did not do well on this medication. He felt like climbing the walls      Medication List    STOP taking these medications   ibuprofen 200 MG tablet Commonly known as:  ADVIL,MOTRIN   lisinopril 10 MG tablet Commonly known as:  PRINIVIL,ZESTRIL   metoprolol succinate 25 MG 24 hr tablet Commonly known as:  TOPROL XL     TAKE these medications   albuterol (2.5 MG/3ML) 0.083% nebulizer solution Commonly known as:  PROVENTIL USE ONE VIAL IN NEBULIZER EVERY 4 HOURS AS NEEDED FOR WHEEZING AND FOR SHORTNESS OF BREATH What changed:  Another medication with the same name was added. Make sure you understand how and when to take each.   albuterol 108 (90 Base) MCG/ACT inhaler Commonly known as:  PROVENTIL HFA;VENTOLIN HFA Inhale 2 puffs into the lungs every 6 (six) hours as needed for wheezing or shortness of breath. What changed:  You were already taking a medication with the same name, and this prescription was added. Make sure you understand how and when to take each.   amLODipine 10 MG tablet Commonly known as:  NORVASC Take 0.5 tablets (5 mg total) by mouth daily. What changed:  how much to take   aspirin 325 MG EC tablet Take 1 tablet (325 mg total) by mouth daily.   bisoprolol 5 MG tablet Commonly known as:  ZEBETA Take 0.5 tablets (2.5 mg total) by mouth daily.   citalopram 10 MG tablet Commonly known as:  CELEXA Take 10 mg by mouth daily. Reported on 06/06/2016   furosemide 20 MG tablet Commonly known as:  LASIX Take 2 tablets (40 mg total) by mouth daily. What changed:  how much to take   losartan 25 MG tablet Commonly known as:  COZAAR Take 1 tablet (25 mg total) by mouth daily. Start taking on:   05/30/2017   mometasone-formoterol 200-5 MCG/ACT Aero Commonly known as:  DULERA Inhale 2 puffs into the lungs 2 (two) times daily.   multivitamin with minerals Tabs tablet Take 1 tablet by mouth daily.   niacin 500 MG CR tablet Commonly known as:  NIASPAN Take 1,000 mg by mouth 2 (two) times daily.   nitroGLYCERIN 0.4 MG SL tablet Commonly known as:  NITROSTAT Place 1 tablet (0.4 mg total) under the tongue every 5 (five) minutes as needed for chest pain.   potassium chloride SA 20 MEQ tablet Commonly known as:  K-DUR,KLOR-CON TAKE 2 TABLETS THREE TIMES DAILY. PLEASE SCHEDULE APPOINTMENT FOR REFILLS.   pravastatin 80 MG tablet Commonly known as:  PRAVACHOL Take 1 tablet (80 mg total) by mouth daily.   predniSONE 20 MG tablet Commonly known as:  DELTASONE Take 1 tablet (20 mg total) by mouth daily with breakfast.   spironolactone 25 MG tablet Commonly known as:  ALDACTONE Take 1 tablet (25 mg total) by mouth daily. Start taking on:  05/30/2017   tiotropium 18 MCG inhalation capsule Commonly known as:  SPIRIVA HANDIHALER Place 1 capsule (18 mcg total) into inhaler  and inhale daily.      Follow-up Information    [name], MD. Schedule an appointment as soon as possible for a visit in 1 week(s).   Specialty:  Internal Medicine Contact information: [address] [place] [address] [phone]        Leslye Peer, MD. Schedule an appointment as soon as possible for a visit in 4 week(s).   Specialty:  Pulmonary Disease Contact information: [address] [place] [address] [phone]        Lewayne Bunting, MD Follow up in 1 week(s).   Specialty:  Cardiology Why:  OFFICE WILL CALL WITH APPT Contact information: [address] [place] [address] [phone]          Allergies  Allergen Reactions  . Buprenorphine Hcl Other (See Comments)    Patient stated that he did not do well on this medication. He felt like  climbing the walls  . Morphine And Related Other (See Comments)    Patient stated that he did not do well on this medication. He felt like climbing the walls        Procedures/Studies: Dg Chest 2 View  Result Date: 05/25/2017 CLINICAL DATA:  Shortness of breath EXAM: CHEST  2 VIEW COMPARISON:  03/12/2016 FINDINGS: Left-sided pacing device as before. Post sternotomy changes. Vascular occlusive device on lateral view. Cardiomegaly with central vascular congestion. No focal infiltrate or effusion. Aortic atherosclerosis. Tiny bilateral effusions. No pneumothorax. Vertebral augmentation changes at the upper lumbar spine. Increased interstitial opacity at the lung bases. IMPRESSION: 1. Cardiomegaly with mild central congestion. No edema or infiltrate. 2. Increased interstitial opacity at the lung bases could relate to bronchial inflammation. Electronically Signed   By: Jasmine Pang M.D.   On: 05/25/2017 00:00    2-D echo Study Conclusions  - Left ventricle: The cavity size was mildly dilated. Wall   thickness was increased in a pattern of moderate LVH. Systolic   function was severely reduced. The estimated ejection fraction   was in the range of 20% to 25%. Diffuse hypokinesis. The study is   not technically sufficient to allow evaluation of LV diastolic   function. LV filling pressure is elevated. - Aortic valve: Sclerosis without stenosis. There was trivial   regurgitation. - Aorta: Dilated aortic root. Aortic root dimension: 47 mm (ED). - Mitral valve: Severe, mostly posteriorly directed MR - may be   functional. - Left atrium: Severely dilated. - Right ventricle: The cavity size was mildly dilated. Moderately   reduced systolic function. - Right atrium: Severely dilated. - Tricuspid valve: There was moderate regurgitation. - Pulmonary arteries: PA peak pressure: 52 mm Hg (S). - Inferior vena cava: The vessel was dilated. The respirophasic   diameter changes were blunted (< 50%),  consistent with elevated   central venous pressure.  Impressions:  - Compared to a prior echo in 2017, the LVEF is lower at 20-25% -   there is now severe MR and severe biatrial enlargement.  Subjective:  breathing much improved today. Feels close to baseline.  Discharge Exam: Vitals:   05/28/17 2031 05/29/17 0500  BP: (!) 152/90 134/79  Pulse: 69 64  Resp: 18 18  Temp: 98 F (36.7 C) 98.2 F (36.8 C)   Vitals:   05/29/17 0248 05/29/17 0500 05/29/17 0726 05/29/17 0727  BP:  134/79    Pulse:  64    Resp:  18    Temp:  98.2 F (36.8 C)  TempSrc:  Oral    SpO2:  96% 96% 96%  Weight: 80.2 kg (176 lb 14.4 oz)     Height:        Gen: not in distress HEENT: moist mucosa, supple neck, JVD+ Chest: Clear Bilaterally, no wheezing. CVS: S1 and S2, systolic murmur 3/6 GI: soft, NT, ND,  Musculoskeletal: warm, no edema    The results of significant diagnostics from this hospitalization (including imaging, microbiology, ancillary and laboratory) are listed below for reference.     Microbiology: Recent Results (from the past 240 hour(s))  Respiratory Panel by PCR     Status: None   Collection Time: 05/25/17  5:18 AM  Result Value Ref Range Status   Adenovirus NOT DETECTED NOT DETECTED Final   Coronavirus 229E NOT DETECTED NOT DETECTED Final   Coronavirus HKU1 NOT DETECTED NOT DETECTED Final   Coronavirus NL63 NOT DETECTED NOT DETECTED Final   Coronavirus OC43 NOT DETECTED NOT DETECTED Final   Metapneumovirus NOT DETECTED NOT DETECTED Final   Rhinovirus / Enterovirus NOT DETECTED NOT DETECTED Final   Influenza A NOT DETECTED NOT DETECTED Final   Influenza B NOT DETECTED NOT DETECTED Final   Parainfluenza Virus 1 NOT DETECTED NOT DETECTED Final   Parainfluenza Virus 2 NOT DETECTED NOT DETECTED Final   Parainfluenza Virus 3 NOT DETECTED NOT DETECTED Final   Parainfluenza Virus 4 NOT DETECTED NOT DETECTED Final   Respiratory Syncytial Virus NOT DETECTED NOT DETECTED  Final   Bordetella pertussis NOT DETECTED NOT DETECTED Final   Chlamydophila pneumoniae NOT DETECTED NOT DETECTED Final   Mycoplasma pneumoniae NOT DETECTED NOT DETECTED Final  MRSA PCR Screening     Status: Abnormal   Collection Time: 05/25/17  6:05 AM  Result Value Ref Range Status   MRSA by PCR POSITIVE (A) NEGATIVE Final    Comment:        The GeneXpert MRSA Assay (FDA approved for NASAL specimens only), is one component of a comprehensive MRSA colonization surveillance program. It is not intended to diagnose MRSA infection nor to guide or monitor treatment for MRSA infections. RESULT CALLED TO, READ BACK BY AND VERIFIED WITH: L MUELLER 05/25/17 @ 0839 M VESTAL      Labs: BNP (last 3 results)  Recent Labs  05/25/17 0004  BNP 2,353.9*   Basic Metabolic Panel:  Recent Labs Lab 05/25/17 0829 05/25/17 0917 05/26/17 0302 05/27/17 0548 05/28/17 0539 05/29/17 0229  NA  --  142 145 144 142 143  K  --  2.8* 3.9 3.3* 3.5 3.1*  CL  --  105 106 105 97* 98*  CO2  --  24 30 29 31  33*  GLUCOSE  --  270* 136* 128* 124* 144*  BUN  --  20 28* 35* 41* 48*  CREATININE  --  1.41* 1.55* 1.41* 1.49* 1.59*  CALCIUM  --  8.5* 8.4* 8.2* 8.5* 8.4*  MG 1.7 1.8  --   --  1.9  --    Liver Function Tests:  Recent Labs Lab 05/25/17 0004 05/25/17 0917 05/27/17 0548  AST 21 28 19   ALT 19 20 20   ALKPHOS 69 75 72  BILITOT 1.2 0.9 0.8  PROT 5.8* 6.1* 6.0*  ALBUMIN 3.4* 3.4* 3.3*   No results for input(s): LIPASE, AMYLASE in the last 168 hours. No results for input(s): AMMONIA in the last 168 hours. CBC:  Recent Labs Lab 05/25/17 0004 05/26/17 0302 05/27/17 0548  WBC 7.4 10.5 9.5  NEUTROABS 5.8  --   --  HGB 12.6* 12.9* 13.1  HCT 40.7 41.9 42.3  MCV 87.0 86.9 87.8  PLT 162 184 190   Cardiac Enzymes: No results for input(s): CKTOTAL, CKMB, CKMBINDEX, TROPONINI in the last 168 hours. BNP: Invalid input(s): POCBNP CBG: No results for input(s): GLUCAP in the last 168  hours. D-Dimer No results for input(s): DDIMER in the last 72 hours. Hgb A1c No results for input(s): HGBA1C in the last 72 hours. Lipid Profile No results for input(s): CHOL, HDL, LDLCALC, TRIG, CHOLHDL, LDLDIRECT in the last 72 hours. Thyroid function studies No results for input(s): TSH, T4TOTAL, T3FREE, THYROIDAB in the last 72 hours.  Invalid input(s): FREET3 Anemia work up No results for input(s): VITAMINB12, FOLATE, FERRITIN, TIBC, IRON, RETICCTPCT in the last 72 hours. Urinalysis    Component Value Date/Time   COLORURINE YELLOW 01/21/2016 1905   APPEARANCEUR CLEAR 01/21/2016 1905   LABSPEC >1.046 (H) 01/21/2016 1905   PHURINE 6.5 01/21/2016 1905   GLUCOSEU NEGATIVE 01/21/2016 1905   HGBUR NEGATIVE 01/21/2016 1905   BILIRUBINUR NEGATIVE 01/21/2016 1905   KETONESUR NEGATIVE 01/21/2016 1905   PROTEINUR NEGATIVE 01/21/2016 1905   UROBILINOGEN 1.0 10/24/2015 2051   NITRITE NEGATIVE 01/21/2016 1905   LEUKOCYTESUR NEGATIVE 01/21/2016 1905   Sepsis Labs Invalid input(s): PROCALCITONIN,  WBC,  LACTICIDVEN Microbiology Recent Results (from the past 240 hour(s))  Respiratory Panel by PCR     Status: None   Collection Time: 05/25/17  5:18 AM  Result Value Ref Range Status   Adenovirus NOT DETECTED NOT DETECTED Final   Coronavirus 229E NOT DETECTED NOT DETECTED Final   Coronavirus HKU1 NOT DETECTED NOT DETECTED Final   Coronavirus NL63 NOT DETECTED NOT DETECTED Final   Coronavirus OC43 NOT DETECTED NOT DETECTED Final   Metapneumovirus NOT DETECTED NOT DETECTED Final   Rhinovirus / Enterovirus NOT DETECTED NOT DETECTED Final   Influenza A NOT DETECTED NOT DETECTED Final   Influenza B NOT DETECTED NOT DETECTED Final   Parainfluenza Virus 1 NOT DETECTED NOT DETECTED Final   Parainfluenza Virus 2 NOT DETECTED NOT DETECTED Final   Parainfluenza Virus 3 NOT DETECTED NOT DETECTED Final   Parainfluenza Virus 4 NOT DETECTED NOT DETECTED Final   Respiratory Syncytial Virus NOT  DETECTED NOT DETECTED Final   Bordetella pertussis NOT DETECTED NOT DETECTED Final   Chlamydophila pneumoniae NOT DETECTED NOT DETECTED Final   Mycoplasma pneumoniae NOT DETECTED NOT DETECTED Final  MRSA PCR Screening     Status: Abnormal   Collection Time: 05/25/17  6:05 AM  Result Value Ref Range Status   MRSA by PCR POSITIVE (A) NEGATIVE Final    Comment:        The GeneXpert MRSA Assay (FDA approved for NASAL specimens only), is one component of a comprehensive MRSA colonization surveillance program. It is not intended to diagnose MRSA infection nor to guide or monitor treatment for MRSA infections. RESULT CALLED TO, READ BACK BY AND VERIFIED WITH: L MUELLER 05/25/17 @ 0839 M VESTAL      Time coordinating discharge: Over 30 minutes  SIGNED:   Eddie North, MD  [facility] 05/29/2017, 11:21 AM Pager   If 7PM-7AM, please contact night-coverage [email or web address] Password [email or web address]

## 2017-05-29 NOTE — Evaluation (Signed)
Physical Therapy Evaluation and D/C Patient Details Name: [name] [name] MRN: [id number] DOB: October 22, 1941 Today's Date: 05/29/2017   History of Present Illness  76 y.o. male coronary artery disease status post coronary artery bypass and graft, atrial fibrillation status post Watchman device, ischemic cardiomyopathy, COPD, renal insufficiency admitted with acute on chronic systolic congestive heart failure and COPD flare.  Clinical Impression  Pt admitted with above diagnosis. Pt currently without significant functional limitations. Pt was able to ambulate on unit without significant LOB.  Scored 23/24 on DGI. Pt states he is at baseline.  Of note, pt did desat to 88% on RA with activity.  O2 incr to >90% with pursed lip breathing.  Had O2 at home in past but not on admit.  Will not follow acutely as pt has no skilled PT needs as pt can ambulate on unit by himself or nursing can ambulate with pt. Sign off.     Follow Up Recommendations No PT follow up    Equipment Recommendations  None recommended by PT    Recommendations for Other Services       Precautions / Restrictions Precautions Precautions: Fall Restrictions Weight Bearing Restrictions: No      Mobility  Bed Mobility Overal bed mobility: Independent                Transfers Overall transfer level: Independent                  Ambulation/Gait Ambulation/Gait assistance: Independent Ambulation Distance (Feet): 350 Feet Assistive device: None Gait Pattern/deviations: WFL(Within Functional Limits)   Gait velocity interpretation: at or above normal speed for age/gender General Gait Details: 1LOB with mod challenge but was able to self correct.  Pt with overall good safey and good balance.    Stairs Stairs: Yes Stairs assistance: Independent Stair Management: Two rails;One rail Right;Alternating pattern;Forwards Number of Stairs: 4 General stair comments: Pt able to ascend and desend steps without  assist and without help.  Wheelchair Mobility    Modified Rankin (Stroke Patients Only)       Balance Overall balance assessment: Independent                               Standardized Balance Assessment Standardized Balance Assessment : Dynamic Gait Index   Dynamic Gait Index Level Surface: Normal Change in Gait Speed: Normal Gait with Horizontal Head Turns: Normal Gait with Vertical Head Turns: Normal Gait and Pivot Turn: Normal Step Over Obstacle: Normal Step Around Obstacles: Normal Steps: Mild Impairment Total Score: 23       Pertinent Vitals/Pain Pain Assessment: No/denies pain    Home Living Family/patient expects to be discharged to:: Private residence Living Arrangements: Alone Available Help at Discharge: Family;Available PRN/intermittently (lives with son) Type of Home: House Home Access: Stairs to enter Entrance Stairs-Rails: Right;Left;Can reach both Secretary/administrator of Steps: 3 Home Layout: One level Home Equipment: Walker - 2 wheels;Wheelchair - manual;Crutches;Bedside commode Additional Comments: All equipment was wife's who passed away.     Prior Function Level of Independence: Independent               Hand Dominance   Dominant Hand: Right    Extremity/Trunk Assessment   Upper Extremity Assessment Upper Extremity Assessment: Defer to OT evaluation    Lower Extremity Assessment Lower Extremity Assessment: Overall WFL for tasks assessed    Cervical / Trunk Assessment Cervical / Trunk Assessment: Normal  Communication  Communication: No difficulties  Cognition Arousal/Alertness: Awake/alert Behavior During Therapy: WFL for tasks assessed/performed Overall Cognitive Status: Within Functional Limits for tasks assessed                                        General Comments General comments (skin integrity, edema, etc.): Scorred 23/24 on DGI suggesting low risk of falls.      Exercises      Assessment/Plan    PT Assessment Patent does not need any further PT services  PT Problem List         PT Treatment Interventions      PT Goals (Current goals can be found in the Care Plan section)  Acute Rehab PT Goals PT Goal Formulation: All assessment and education complete, DC therapy    Frequency     Barriers to discharge        Co-evaluation               AM-PAC PT "6 Clicks" Daily Activity  Outcome Measure Difficulty turning over in bed (including adjusting bedclothes, sheets and blankets)?: None Difficulty moving from lying on back to sitting on the side of the bed? : None Difficulty sitting down on and standing up from a chair with arms (e.g., wheelchair, bedside commode, etc,.)?: None Help needed moving to and from a bed to chair (including a wheelchair)?: None Help needed walking in hospital room?: None Help needed climbing 3-5 steps with a railing? : None 6 Click Score: 24    End of Session Equipment Utilized During Treatment: Gait belt Activity Tolerance: Patient tolerated treatment well Patient left: in bed;with call bell/phone within reach;with family/visitor present Nurse Communication: Mobility status PT Visit Diagnosis: Muscle weakness (generalized) (M62.81)    Time: 0375-4360 PT Time Calculation (min) (ACUTE ONLY): 16 min   Charges:   PT Evaluation $PT Eval Low Complexity: 1 Procedure     PT G Codes:        Kristalynn Coddington,PT Acute Rehabilitation [phone] (pager)   Berline Lopes 05/29/2017, 10:49 AM

## 2017-05-30 ENCOUNTER — Other Ambulatory Visit: Payer: Self-pay | Admitting: Cardiology

## 2017-05-30 ENCOUNTER — Telehealth: Payer: Self-pay | Admitting: Cardiology

## 2017-05-30 ENCOUNTER — Telehealth: Payer: Self-pay | Admitting: Emergency Medicine

## 2017-05-30 DIAGNOSIS — I10 Essential (primary) hypertension: Secondary | ICD-10-CM

## 2017-05-30 DIAGNOSIS — I4821 Permanent atrial fibrillation: Secondary | ICD-10-CM

## 2017-05-30 DIAGNOSIS — I5022 Chronic systolic (congestive) heart failure: Secondary | ICD-10-CM

## 2017-05-30 MED ORDER — PRAVASTATIN SODIUM 80 MG PO TABS: 80.0000 mg | ORAL_TABLET | Freq: Every day | ORAL | 6 refills | Status: DC

## 2017-05-30 MED ORDER — AMLODIPINE BESYLATE 10 MG PO TABS: 5.0000 mg | ORAL_TABLET | Freq: Every day | ORAL | 6 refills | Status: DC

## 2017-05-30 NOTE — Telephone Encounter (Signed)
Refill sent to the pharmacy electronically.  

## 2017-05-30 NOTE — Telephone Encounter (Signed)
Error

## 2017-05-30 NOTE — Telephone Encounter (Signed)
Spoke with Eunice Blase, pt's daughter. She states that pt was started on [facility] after his recent hospital stay but pharmacy would not fill the rx that they sent him home with.  Pt has a hospital f/u with Sarah on 06/25/17.  Daughter wants to know if RB will still call in Rx for Dulera 200 2 sp bid.  Please advise. Daughter is aware that we will call her back 05/31/17.

## 2017-05-30 NOTE — Telephone Encounter (Signed)
New message    Pt daughter is calling about these medications. She said they have been ordered through mail service pharmacy, but pt is out.  *STAT* If patient is at the pharmacy, call can be transferred to refill team.   1. Which medications need to be refilled? (please list name of each medication and dose if known) amlodipine 10 mg, pravastatin 80 mg  2. Which pharmacy/location (including street and city if local pharmacy) is medication to be sent to? [facility] in [place]   3. Do they need a 30 day or 90 day supply? 30 day

## 2017-05-31 NOTE — Telephone Encounter (Signed)
Rx(s) sent to pharmacy electronically.  

## 2017-05-31 NOTE — Telephone Encounter (Signed)
RB please advise. Thanks.  

## 2017-06-01 NOTE — Telephone Encounter (Signed)
Yes ok to fill this

## 2017-06-01 NOTE — Telephone Encounter (Signed)
RB please advise. Thanks.  

## 2017-06-01 NOTE — Telephone Encounter (Signed)
[email or web address] x1 

## 2017-06-04 ENCOUNTER — Telehealth: Payer: Self-pay | Admitting: Cardiology

## 2017-06-04 MED ORDER — MOMETASONE FURO-FORMOTEROL FUM 200-5 MCG/ACT IN AERO: 2.0000 | INHALATION_SPRAY | Freq: Two times a day (BID) | RESPIRATORY_TRACT | 5 refills | Status: DC

## 2017-06-04 NOTE — Telephone Encounter (Signed)
F/u call.. Patient daughter asking for call (debbie)

## 2017-06-04 NOTE — Telephone Encounter (Signed)
Spoke with Debbie. RX will be called into [facility] in [place]. She is aware. Nothing else needed at time of call.

## 2017-06-04 NOTE — Telephone Encounter (Signed)
New message    Pt is calling asking for a call back from [facility] about an appt. He said he needs to talk to her before he schedules an appt.

## 2017-06-05 ENCOUNTER — Ambulatory Visit (INDEPENDENT_AMBULATORY_CARE_PROVIDER_SITE_OTHER): Payer: Medicare Other

## 2017-06-05 ENCOUNTER — Telehealth: Payer: Self-pay

## 2017-06-05 DIAGNOSIS — Z95810 Presence of automatic (implantable) cardiac defibrillator: Secondary | ICD-10-CM

## 2017-06-05 DIAGNOSIS — I5022 Chronic systolic (congestive) heart failure: Secondary | ICD-10-CM | POA: Diagnosis not present

## 2017-06-05 NOTE — Progress Notes (Signed)
EPIC Encounter for ICM Monitoring  Patient Name: [name] [name] is a 76 y.o. male Date: 06/05/2017 Primary Care Physican: [name], MD Primary Cardiologist:Crenshaw Electrophysiologist: [name] Dry Weight:unknown Bi-V Pacing: 97% AT/AF Burden >99%                                                 Heart Failure questions reviewed, pt sounds congested but said he is feeling fine.  Hospitalized 6/14 - 6/19 for COPD exacerbation with CHF per hospital notes.    Thoracic impedance normal but above baseline suggesting dryness which correlates being diuresed at hospital.    Prescribed dosage: Furosemide 20 mg 2 tablets (40 mg total) daily. Potassium 20 mEq 2 tablets 3 times a day.   Labs: 02/01/2017 Creatinine 1.44, BUN 23, Potassium 3.2. Sodium 146 08/15/2016 Creatinine 1.23, BUN 21, Potassium 3.6, Sodium 146  08/09/2016 Creatinine 1.36, BUN 21, Potassium 2.7, Sodium 143  07/10/2016 Creatinine 1.72, BUN 33, Potassium 5.1, Sodium 143  06/02/2016 Creatinine 1.44, BUN 20, Potassium 3.0, Sodium 144   Recommendations: No changes.   Encouraged to call for fluid symptoms.  Follow-up plan: ICM clinic phone appointment on 07/09/2017.  Office appointment scheduled with Azalee Course, PA for post up follow up.   Copy of ICM check sent to primary cardiologist and device physician.   3 month ICM trend: 06/05/2017   1 Year ICM trend:      Karie Soda, RN 06/05/2017 10:40 AM

## 2017-06-05 NOTE — Telephone Encounter (Signed)
PA started on covermymeds for [facility] through [employer] [place]. Pending review  Malique Debellis Key: [id number] - PA Case ID: [id number] - Rx #: [id number]   I will route to [facility] for follow up.

## 2017-06-05 NOTE — Telephone Encounter (Signed)
Spoke with pt, follow up appointment made with APP.

## 2017-06-06 NOTE — Telephone Encounter (Signed)
Checked Cover My Meds. PA still pending. Will await PA decision.

## 2017-06-07 NOTE — Telephone Encounter (Signed)
Received a form to complete PA. Form has been filled out and needs RB signature. Will update chart as soon as this is signed.

## 2017-06-08 NOTE — Telephone Encounter (Signed)
Received a PA determination. PA has been denied. No alternatives were given.  RB - please advise.

## 2017-06-08 NOTE — Telephone Encounter (Signed)
PA form has been signed and returned to me. This has been faxed back. Will await PA decision.

## 2017-06-08 NOTE — Telephone Encounter (Signed)
Need to figure our what we can substitute - have him get his formulary

## 2017-06-11 NOTE — Telephone Encounter (Signed)
Spoke with pt. He is aware that we need his drug formulary. Will await his call back.

## 2017-06-12 ENCOUNTER — Ambulatory Visit (INDEPENDENT_AMBULATORY_CARE_PROVIDER_SITE_OTHER): Payer: Medicare Other | Admitting: Physician Assistant

## 2017-06-12 ENCOUNTER — Encounter: Payer: Self-pay | Admitting: Physician Assistant

## 2017-06-12 ENCOUNTER — Other Ambulatory Visit: Payer: Self-pay | Admitting: Cardiology

## 2017-06-12 VITALS — BP 128/84 | HR 78 | Ht 73.0 in | Wt 183.0 lb

## 2017-06-12 DIAGNOSIS — Z79899 Other long term (current) drug therapy: Secondary | ICD-10-CM

## 2017-06-12 DIAGNOSIS — I10 Essential (primary) hypertension: Secondary | ICD-10-CM

## 2017-06-12 DIAGNOSIS — I5042 Chronic combined systolic (congestive) and diastolic (congestive) heart failure: Secondary | ICD-10-CM | POA: Diagnosis not present

## 2017-06-12 DIAGNOSIS — I714 Abdominal aortic aneurysm, without rupture, unspecified: Secondary | ICD-10-CM

## 2017-06-12 DIAGNOSIS — E785 Hyperlipidemia, unspecified: Secondary | ICD-10-CM

## 2017-06-12 DIAGNOSIS — I25810 Atherosclerosis of coronary artery bypass graft(s) without angina pectoris: Secondary | ICD-10-CM | POA: Diagnosis not present

## 2017-06-12 DIAGNOSIS — I480 Paroxysmal atrial fibrillation: Secondary | ICD-10-CM

## 2017-06-12 DIAGNOSIS — J449 Chronic obstructive pulmonary disease, unspecified: Secondary | ICD-10-CM | POA: Diagnosis not present

## 2017-06-12 LAB — BASIC METABOLIC PANEL
BUN/Creatinine Ratio: 28 — ABNORMAL HIGH (ref 10–24)
BUN: 41 mg/dL — AB (ref 8–27)
CHLORIDE: 105 mmol/L (ref 96–106)
CO2: 21 mmol/L (ref 20–29)
CREATININE: 1.44 mg/dL — AB (ref 0.76–1.27)
Calcium: 9.1 mg/dL (ref 8.6–10.2)
GFR calc Af Amer: 55 mL/min/{1.73_m2} — ABNORMAL LOW (ref >59)
GFR calc non Af Amer: 47 mL/min/{1.73_m2} — ABNORMAL LOW (ref >59)
GLUCOSE: 79 mg/dL (ref 65–99)
Potassium: 5.5 mmol/L — ABNORMAL HIGH (ref 3.5–5.2)
Sodium: 143 mmol/L (ref 134–144)

## 2017-06-12 MED ORDER — SACUBITRIL-VALSARTAN 49-51 MG PO TABS: 1.0000 | ORAL_TABLET | Freq: Two times a day (BID) | ORAL | 0 refills | Status: DC

## 2017-06-12 NOTE — Telephone Encounter (Signed)
lmtcb X1 for daughter Eunice Blase.  Will route to LL for follow-up as this was generated by TA.

## 2017-06-12 NOTE — Telephone Encounter (Signed)
ATC [name] [name] on the number provided but this number directed to [employer] and needed an ext to be transferred to another department. [name] [name] on  [phone]  Which was a previous number provided to try to reach her

## 2017-06-12 NOTE — Telephone Encounter (Signed)
Patient daughter Chad Cordial called states that Spiriva and Symbicort are the preferred and covered drugs in place of [facility]. Pt uses [facility] in [place] - Eunice Blase can be reached at [phone] -pr

## 2017-06-12 NOTE — Progress Notes (Signed)
Kidney function stable, potassium high, decrease potassium supplement from three times a day to twice a day. Recheck BMET in 1 week as discussed during office visit

## 2017-06-12 NOTE — Patient Instructions (Signed)
Medication Instructions:   STOP losartan  START Entresto 49/51mg . Take TWICE A DAY (Once in AM, once in PM - 12 hours apart)   Labwork:   BMET today and in 1 week. This is not a fasting test.  Testing/Procedures:  none  Follow-Up:  In 2 weeks with Azalee Course PA, or with clinical pharmacist here in the office for a dose adjustment on your Entresto.  In 3 months with Dr. [name]   If you need a refill on your cardiac medications before your next appointment, please call your pharmacy.

## 2017-06-12 NOTE — Telephone Encounter (Signed)
Debbie returning call and can be reached [phone]  refrence # [id number] has som questions.Caren Griffins

## 2017-06-12 NOTE — Progress Notes (Signed)
Cardiology Office Note    Date:  06/12/2017   ID:  [name] [name], DOB 1941-03-10, MRN [id number]  PCP:  [name], MD  Cardiologist:  Dr. [name] Electrophysiologist: Dr. [name]  Chief Complaint  Patient presents with  . Follow-up    seen for Dr. [name]    History of Present Illness:  [name] [name] is a 76 y.o. male with PMH of CAD s/p CABG 1998 with LIMA to LAD, SVG to D1, SVG to OM2/OM3, ICM s/p St Jude CRT-D in 2013, chronic combined systolic and diastolic heart failure with baseline EF 25-30%, PAF s/p LAA closure with Watchman in 05/2016 and AVN ablation in 08/2016, COPD, AAA s/p repair in 12/2015, HTN, HLD and continued tobacco use. Last cardiac catheterization in 2013 showed patent grafts. More recently he was admitted in June 2018 with acute COPD exacerbation and also acute on chronic systolic heart failure. He underwent aggressive diuresis. Right heart cath performed on 05/28/2017 showed cardiac index 2.4, cardiac output 4.9, wedge pressure 12. Echocardiogram obtained on 05/29/2017 showed EF 25-30%, diffuse hypokinesis, mild AR, moderate MR, severely dilated left atrium, PA peak pressure 25 mmHg. He previously was on 60 mg Lasix every other day, this has been changed to 40 mg daily of Lasix along with spironolactone.  He presents today for cardiology office visit. He has been euvolemic since recent discharge. I will obtain a basic metabolic panel today. We have discussed the various options, he was agreeable to start on Entresto. I have discontinued his losartan and started on Entresto of 49-51 mg. Otherwise, he denies any shortness of breath. He is continuing on a maintenance dose of Spiriva and Dulera and use albuterol on an as-needed basis. Otherwise on physical exam, he does not have any significant lower extremity edema, orthopnea or PND.   Past Medical History:  Diagnosis Date  . Aortic dissection (HCC)    H/O focal aortic dissection  . CAD (coronary artery  disease)    a. s/p CABG in 1998 with LIMA-LAD, SVG-D1, SVG-OM2-OM3, and SVG-RCA b. cath 10/07:  LM ok, LAD occluded, AV CFX 95%, pRCA occluded; L-LAD, S-OM2/OM3, S-PDA ok c. 2013: cath showing patent grafts  . Cerebrovascular disease    a. s/p prior Left CEA;  followed by Dr. [name]  . CHF (congestive heart failure) (HCC)    a. s/p STJ CRTD  . CKD (chronic kidney disease)   . COPD (chronic obstructive pulmonary disease) (HCC)   . Depression   . DVT (deep venous thrombosis) (HCC)   . History of stomach ulcers   . Hyperlipidemia   . Hypertension   . Iliac artery aneurysm, left (HCC)    followed by Dr. [name]  . Ischemic cardiomyopathy    a. s/p St. Jude CRTD placement in 2013  . LBBB (left bundle branch block)   . Myocardial infarction (HCC) 1997  . Nephrolithiasis   . PAF (paroxysmal atrial fibrillation) (HCC)    a. on amio and pradaxa. b. s/p AVN ablation  . Stroke [facility]) 12/2015 "several mini"   a. on coumadin --> switched to pradaxa b. s/p Watchman in 05/2016    Past Surgical History:  Procedure Laterality Date  . ABDOMINAL AORTIC ANEURYSM REPAIR N/A 01/03/2016   Procedure: ANEURYSM ABDOMINAL AORTA, BILATERAL COMMON ILIAC REPAIR;  Surgeon: [name], MD;  Location: [facility] OR;  Service: Vascular;  Laterality: N/A;  . ABDOMINAL AORTIC ANEURYSM REPAIR  01/03/2016  . APPENDECTOMY  09/11/11  . BACK SURGERY    .  BI-VENTRICULAR IMPLANTABLE CARDIOVERTER DEFIBRILLATOR N/A 08/22/2012   SJM Quadra Assura BiV ICD implanted by Dr [name]  . CARDIAC CATHETERIZATION  1998; 2013  . CARDIOVERSION N/A 07/26/2016   Procedure: CARDIOVERSION;  Surgeon: Lars Masson, MD;  Location[facility] ENDOSCOPY;  Service: Cardiovascular;  Laterality: N/A;  . CAROTID ENDARTERECTOMY Left July  2005  . CORONARY ARTERY BYPASS GRAFT  1998   CABG X5  . CYSTOSCOPY W/ STONE MANIPULATION  X 2  . ELECTROPHYSIOLOGIC STUDY N/A 08/17/2016   AVN ablation by Dr [name]  . ESOPHAGOGASTRODUODENOSCOPY N/A 02/28/2016   Procedure:  ESOPHAGOGASTRODUODENOSCOPY (EGD);  Surgeon: [name], MD;  Location: [facility] ENDOSCOPY;  Service: Endoscopy;  Laterality: N/A;  . LEFT ATRIAL APPENDAGE OCCLUSION N/A 06/01/2016   Watchman device implanted by Dr [name]  . POSTERIOR FUSION LUMBAR SPINE  Sep 27, 2014   L4-L5  . PR VEIN BYPASS GRAFT,AORTO-FEM-POP  01/03/2016  . RIGHT HEART CATH N/A 05/28/2017   Procedure: Right Heart Cath;  Surgeon: Dolores Patty, MD;  Location: [facility] INVASIVE CV LAB;  Service: Cardiovascular;  Laterality: N/A;  . TEE WITHOUT CARDIOVERSION N/A 07/26/2016   Procedure: TRANSESOPHAGEAL ECHOCARDIOGRAM (TEE);  Surgeon: Lars Masson, MD;  Location: [facility] ENDOSCOPY;  Service: Cardiovascular;  Laterality: N/A;    Current Medications: Outpatient Medications Prior to Visit  Medication Sig Dispense Refill  . albuterol (PROVENTIL HFA;VENTOLIN HFA) 108 (90 Base) MCG/ACT inhaler Inhale 2 puffs into the lungs every 6 (six) hours as needed for wheezing or shortness of breath. 1 Inhaler 2  . albuterol (PROVENTIL) (2.5 MG/3ML) 0.083% nebulizer solution USE ONE VIAL IN NEBULIZER EVERY 4 HOURS AS NEEDED FOR WHEEZING AND FOR SHORTNESS OF BREATH 360 mL 11  . amLODipine (NORVASC) 10 MG tablet Take 0.5 tablets (5 mg total) by mouth daily. 30 tablet 6  . aspirin EC 325 MG EC tablet Take 1 tablet (325 mg total) by mouth daily.    . bisoprolol (ZEBETA) 5 MG tablet Take 0.5 tablets (2.5 mg total) by mouth daily. 30 tablet 0  . citalopram (CELEXA) 10 MG tablet Take 10 mg by mouth daily. Reported on 06/06/2016    . furosemide (LASIX) 20 MG tablet Take 2 tablets (40 mg total) by mouth daily. 60 tablet 0  . mometasone-formoterol (DULERA) 200-5 MCG/ACT AERO Inhale 2 puffs into the lungs 2 (two) times daily. 1 Inhaler 5  . Multiple Vitamin (MULITIVITAMIN WITH MINERALS) TABS Take 1 tablet by mouth daily.    . niacin (NIASPAN) 500 MG CR tablet Take 1,000 mg by mouth 2 (two) times daily.     . Potassium Chloride ER 20 MEQ TBCR Take 2 tablets by  mouth 3 (three) times daily. PLEASE CONTACT OFFICE FOR ADDITIONAL REFILLS FINAL WARNING 540 tablet 0  . pravastatin (PRAVACHOL) 80 MG tablet Take 1 tablet (80 mg total) by mouth daily. 30 tablet 6  . predniSONE (DELTASONE) 20 MG tablet Take 1 tablet (20 mg total) by mouth daily with breakfast. 16 tablet 0  . spironolactone (ALDACTONE) 25 MG tablet Take 1 tablet (25 mg total) by mouth daily. 30 tablet 0  . tiotropium (SPIRIVA HANDIHALER) 18 MCG inhalation capsule Place 1 capsule (18 mcg total) into inhaler and inhale daily. 30 capsule 2  . losartan (COZAAR) 25 MG tablet Take 1 tablet (25 mg total) by mouth daily. 30 tablet 0  . nitroGLYCERIN (NITROSTAT) 0.4 MG SL tablet Place 1 tablet (0.4 mg total) under the tongue every 5 (five) minutes as needed for chest pain. 100 tablet 3  No facility-administered medications prior to visit.      Allergies:   Buprenorphine hcl and Morphine and related   Social History   Social History  . Marital status: Widowed    Spouse name: N/A  . Number of children: 3  . Years of education: 26 TH   Occupational History  . RETIRED---truck driver Retired   Social History Main Topics  . Smoking status: Current Some Day Smoker    Packs/day: 0.50    Years: 48.00    Types: Cigarettes    Last attempt to quit: 12/11/2004  . Smokeless tobacco: Never Used     Comment: stated "I'm trying to quit  . Alcohol use 0.0 oz/week     Comment: 06/01/2016 "haven't had a drink in years"  . Drug use: No  . Sexual activity: Yes   Other Topics Concern  . None   Social History Narrative   Patient is married with 3 children.   Patient is right handed.   Patient has 10 th grade education.   Patient drinks tea occasionally.     Family History:  The patient's family history includes Stroke in his maternal uncle and maternal uncle.   ROS:   Please see the history of present illness.    ROS All other systems reviewed and are negative.   PHYSICAL EXAM:   VS:  BP 128/84    Pulse 78   Ht 6\' 1"  (1.854 m)   Wt 183 lb (83 kg)   BMI 24.14 kg/m    GEN: Well nourished, well developed, in no acute distress  HEENT: normal  Neck: no JVD, carotid bruits, or masses Cardiac: RRR; no murmurs, rubs, or gallops,no edema  Respiratory:  clear to auscultation bilaterally, normal work of breathing GI: soft, nontender, nondistended, + BS MS: no deformity or atrophy  Skin: warm and dry, no rash Neuro:  Alert and Oriented x 3, Strength and sensation are intact Psych: euthymic mood, full affect  Wt Readings from Last 3 Encounters:  06/12/17 183 lb (83 kg)  05/29/17 176 lb 14.4 oz (80.2 kg)  04/10/17 180 lb (81.6 kg)      Studies/Labs Reviewed:   EKG:  EKG is not ordered today.    Recent Labs: 05/25/2017: B Natriuretic Peptide 2,353.9 05/27/2017: ALT 20; Hemoglobin 13.1; Platelets 190 05/28/2017: Magnesium 1.9 06/12/2017: BUN 41; Creatinine, Ser 1.44; Potassium 5.5; Sodium 143   Lipid Panel    Component Value Date/Time   CHOL 144 12/24/2014 0333   TRIG 154 (H) 12/24/2014 0333   HDL 38 (L) 12/24/2014 0333   CHOLHDL 3.8 12/24/2014 0333   VLDL 31 12/24/2014 0333   LDLCALC 75 12/24/2014 0333   LDLDIRECT 91.8 04/28/2010 0000    Additional studies/ records that were reviewed today include:   Myoview 11/24/2015 Study Highlights    Nuclear stress EF: 25%.  The left ventricular ejection fraction is severely decreased (<30%).  Defect 1: There is a medium defect of severe severity present in the basal inferior, mid inferior and apex location.  Findings consistent with prior myocardial infarction.  This is an intermediate risk study.   1. Intermediate risk study secondary to severe LV dysfunction 2. Inferior scar w/o ischemia consistent with ISCM     Cath 05/28/2017 Conclusion   Findings:  RA = 5 RV = 44/4 PA = 41/18 (26) PCW = 12 (no significant v-waves) Fick cardiac output/index = 4.9/2.4 PVR = 1.5 WU FA sat = 89% PA sat = 56%.  59%  Assessment:  1. Relatively well compensated hemodynamics with mild pulmonary HTN.  2. No significant v-waves in wedge tracing  Plan/Discussion:  He has been well diuresed. Cardiac output is adequate. Can switch back to po diuretics in am.  Will need repeat echo to reassess MR now that volume status improved.      Echo 05/29/2017 LV EF: 25% -   30%  Study Conclusions  - Left ventricle: The cavity size was moderately dilated. There was   moderate concentric hypertrophy. Systolic function was severely   reduced. The estimated ejection fraction was in the range of 25%   to 30%. Diffuse hypokinesis. - Aortic valve: Transvalvular velocity was within the normal range.   There was no stenosis. There was mild regurgitation. - Mitral valve: Transvalvular velocity was within the normal range.   There was no evidence for stenosis. There was moderate   regurgitation. - Left atrium: The atrium was severely dilated. - Right ventricle: The cavity size was normal. Wall thickness was   normal. Systolic function was moderately reduced. - Right atrium: The atrium was mildly dilated. - Atrial septum: No defect or patent foramen ovale was identified. - Tricuspid valve: There was mild regurgitation. - Pulmonary arteries: Systolic pressure was within the normal   range. PA peak pressure: 25 mm Hg (S).   ASSESSMENT:    1. Chronic combined systolic and diastolic heart failure (HCC)   2. Encounter for long-term (current) use of high-risk medication   3. Coronary artery disease involving coronary bypass graft of native heart without angina pectoris   4. PAF (paroxysmal atrial fibrillation) (HCC)   5. Chronic obstructive pulmonary disease, unspecified COPD type (HCC)   6. Abdominal aortic aneurysm (AAA) without rupture (HCC)   7. Essential hypertension   8. Hyperlipidemia, unspecified hyperlipidemia type      PLAN:  In order of problems listed above:  1. Chronic combined systolic and  diastolic heart failure: Baseline EF 25-30% on echocardiogram 05/29/2017. He is currently on bisoprolol and spironolactone. We'll transition losartan to Entresto 49-51 mg. He will need a basic metabolic panel today and also in one week.   2. CAD s/p CABG: No recent angina.  3. Paroxysmal atrial fibrillation: On aspirin, s/p Watchman  4. COPD: s/p recent COPD exacerbation, currently on Dulera and Spiriva for maintenance, also on albuterol for exacerbation.  5. History of AAA: Status post repair  6. Hypertension: Blood pressure stable on current medication, will discontinue losartan and a switch to [employer]. We have given him samples today and I will bring him back in 2 weeks for dosage adjustment.  7. Hyperlipidemia: He is on niacin and Pravachol   Medication Adjustments/Labs and Tests Ordered: Current medicines are reviewed at length with the patient today.  Concerns regarding medicines are outlined above.  Medication changes, Labs and Tests ordered today are listed in the Patient Instructions below. Patient Instructions  Medication Instructions:   STOP losartan  START Entresto 49/51mg . Take TWICE A DAY (Once in AM, once in PM - 12 hours apart)   Labwork:   BMET today and in 1 week. This is not a fasting test.  Testing/Procedures:  none  Follow-Up:  In 2 weeks with Azalee Course PA, or with clinical pharmacist here in the office for a dose adjustment on your Entresto.  In 3 months with Dr. [name]   If you need a refill on your cardiac medications before your next appointment, please call your pharmacy.      Ramond Dial, [place]  06/12/2017  6:34 PM    [facility] [address] [place], [place], [place]  [address] Phone: [phone]; Fax: [phone]

## 2017-06-14 ENCOUNTER — Telehealth: Payer: Self-pay | Admitting: Emergency Medicine

## 2017-06-14 NOTE — Telephone Encounter (Signed)
RB  Please Advise-  Looks like there is another phone note that you were responding to on 06/05/17. Toniann Fail from [employer] called about an appeal for the [facility], she wanted to know if patient has tried and fail Breo or Symbicort or is there any indications why this patient could not use either one.

## 2017-06-15 NOTE — Telephone Encounter (Signed)
Attempted to call Debbie on all the numbers have listed for her. None of them are in working order. I called the pt and let him know that we will have RB address this message on Monday when he returns to the office. Pt thanked me for our time and will await our call.  Pt's insurance prefers Symbicort and Spiriva.  RB - please advise. Thanks.

## 2017-06-15 NOTE — Telephone Encounter (Signed)
RB please advise if one of the other meds can be used in place of the [place].  Thanks

## 2017-06-19 LAB — BASIC METABOLIC PANEL
BUN/Creatinine Ratio: 25 — ABNORMAL HIGH (ref 10–24)
BUN: 33 mg/dL — AB (ref 8–27)
CALCIUM: 9 mg/dL (ref 8.6–10.2)
CHLORIDE: 105 mmol/L (ref 96–106)
CO2: 25 mmol/L (ref 20–29)
Creatinine, Ser: 1.31 mg/dL — ABNORMAL HIGH (ref 0.76–1.27)
GFR, EST AFRICAN AMERICAN: 62 mL/min/{1.73_m2} (ref >59)
GFR, EST NON AFRICAN AMERICAN: 53 mL/min/{1.73_m2} — AB (ref >59)
Glucose: 84 mg/dL (ref 65–99)
Potassium: 5.1 mmol/L (ref 3.5–5.2)
Sodium: 145 mmol/L — ABNORMAL HIGH (ref 134–144)

## 2017-06-19 NOTE — Telephone Encounter (Signed)
OK to substitute Symbicort 160 / 4.5 2 puffs bid

## 2017-06-19 NOTE — Telephone Encounter (Signed)
Addressed other phone note

## 2017-06-19 NOTE — Telephone Encounter (Signed)
LM for patient x 1 

## 2017-06-19 NOTE — Progress Notes (Signed)
Kidney function improving, potassium normalized.

## 2017-06-20 MED ORDER — BUDESONIDE-FORMOTEROL FUMARATE 160-4.5 MCG/ACT IN AERO: 2.0000 | INHALATION_SPRAY | Freq: Two times a day (BID) | RESPIRATORY_TRACT | 6 refills | Status: DC

## 2017-06-20 NOTE — Telephone Encounter (Signed)
Pt aware of change in medication - expressed understanding.  Pt requests that this be sent to [facility] in [place] -- Rx sent for Symbicort 160 Pt aware to contact our office if any issues with the cost or medication itself. Nothing further needed.

## 2017-06-25 ENCOUNTER — Encounter: Payer: Self-pay | Admitting: Nurse Practitioner

## 2017-06-25 ENCOUNTER — Ambulatory Visit (INDEPENDENT_AMBULATORY_CARE_PROVIDER_SITE_OTHER): Payer: Medicare Other | Admitting: Acute Care

## 2017-06-25 ENCOUNTER — Encounter: Payer: Self-pay | Admitting: Acute Care

## 2017-06-25 ENCOUNTER — Ambulatory Visit (INDEPENDENT_AMBULATORY_CARE_PROVIDER_SITE_OTHER): Payer: Medicare Other | Admitting: Nurse Practitioner

## 2017-06-25 ENCOUNTER — Other Ambulatory Visit: Payer: Self-pay | Admitting: *Deleted

## 2017-06-25 VITALS — BP 118/70 | HR 75 | Ht 73.0 in | Wt 188.0 lb

## 2017-06-25 VITALS — BP 99/62 | HR 78 | Wt 190.4 lb

## 2017-06-25 DIAGNOSIS — I25810 Atherosclerosis of coronary artery bypass graft(s) without angina pectoris: Secondary | ICD-10-CM

## 2017-06-25 DIAGNOSIS — I679 Cerebrovascular disease, unspecified: Secondary | ICD-10-CM

## 2017-06-25 DIAGNOSIS — J449 Chronic obstructive pulmonary disease, unspecified: Secondary | ICD-10-CM

## 2017-06-25 DIAGNOSIS — E785 Hyperlipidemia, unspecified: Secondary | ICD-10-CM

## 2017-06-25 DIAGNOSIS — J441 Chronic obstructive pulmonary disease with (acute) exacerbation: Secondary | ICD-10-CM

## 2017-06-25 DIAGNOSIS — I4821 Permanent atrial fibrillation: Secondary | ICD-10-CM

## 2017-06-25 DIAGNOSIS — I482 Chronic atrial fibrillation: Secondary | ICD-10-CM | POA: Diagnosis not present

## 2017-06-25 NOTE — Patient Instructions (Addendum)
It is great to see you today. Congratulations on quitting smoking. Keep it up. Continue the Symbicort 2 puffs twuce daily. Continue the Spiriva daily. Use your rescue inhaler and neb treatments as needed. Mucinex for chest congestion, 1200 mg once daily with full glass of water. We will walk you today in the office today. Follow up with Dr. Delton Coombes in 3 months with PFT's prior. Please contact office for sooner follow up if symptoms do not improve or worsen or seek emergency care

## 2017-06-25 NOTE — Progress Notes (Signed)
History of Present Illness [name] [name] is a 76 y.o. male with COPD and combined systolic and diastolic CHF ( EF 25%, with IC/ pacemaker). He is followed by Dr. Delton Coombes.   06/25/2017 Hospital Follow Up : Pt presents for hospital follow up. He was hospitalized 05/24/2017-05/29/2017 for COPD exacerbation.He was treated with 5 days of oral doxycycline, and IV steroids, scheduled bronchodilators and oxygen. He presents today stating that he is much better. He completed his Doxycycline and prednisone taper at  Discharged as prescribed. Prior to hospitalization the patient had not been taking any maintenance inhalers, although Dr. Delton Coombes had attempted to have him start. He had refused up to this point. Since discharge he has been taking Symbicort and Spiriva as maintenance. He states  he has been compliant with the medications. He realized that they do make him feel better..He has quit smoking since his discharge.He has not had to use his neb treatments as he has not had shortness of breath. He has a cough with clear to tan secretions. He states this is his baseline cough. He denies fever, chest pain, orthopnea or hemoptysis.     Test Results:  CBC Latest Ref Rng & Units 05/27/2017 05/26/2017 05/25/2017  WBC 4.0 - 10.5 K/uL 9.5 10.5 7.4  Hemoglobin 13.0 - 17.0 g/dL 16.1 12.9(L) 12.6(L)  Hematocrit 39.0 - 52.0 % 42.3 41.9 40.7  Platelets 150 - 400 K/uL 190 184 162    BMP Latest Ref Rng & Units 06/19/2017 06/12/2017 05/29/2017  Glucose 65 - 99 mg/dL 84 79 096(E)  BUN 8 - 27 mg/dL 45(W) 09(W) 11(B)  Creatinine 0.76 - 1.27 mg/dL 1.47(W) 2.95(A) 2.13(Y)  BUN/Creat Ratio 10 - 24 25(H) 28(H) -  Sodium 134 - 144 mmol/L 145(H) 143 143  Potassium 3.5 - 5.2 mmol/L 5.1 5.5(H) 3.1(L)  Chloride 96 - 106 mmol/L 105 105 98(L)  CO2 20 - 29 mmol/L 25 21 33(H)  Calcium 8.6 - 10.2 mg/dL 9.0 9.1 8.6(V)    BNP    Component Value Date/Time   BNP 2,353.9 (H) 05/25/2017 0004   BNP 1,794.7 (H) 03/28/2016 1017     ProBNP    Component Value Date/Time   PROBNP 138.0 (H) 09/19/2013 1326    Past medical hx Past Medical History:  Diagnosis Date  . Aortic dissection (HCC)    H/O focal aortic dissection  . CAD (coronary artery disease)    a. s/p CABG in 1998 with LIMA-LAD, SVG-D1, SVG-OM2-OM3, and SVG-RCA b. cath 10/07:  LM ok, LAD occluded, AV CFX 95%, pRCA occluded; L-LAD, S-OM2/OM3, S-PDA ok c. 2013: cath showing patent grafts  . Cerebrovascular disease    a. s/p prior Left CEA;  followed by Dr. [name]  . CHF (congestive heart failure) (HCC)    a. s/p STJ CRTD  . CKD (chronic kidney disease)   . COPD (chronic obstructive pulmonary disease) (HCC)   . Depression   . DVT (deep venous thrombosis) (HCC)   . History of stomach ulcers   . Hyperlipidemia   . Hypertension   . Iliac artery aneurysm, left (HCC)    followed by Dr. [name]  . Ischemic cardiomyopathy    a. s/p St. Jude CRTD placement in 2013  . LBBB (left bundle branch block)   . Myocardial infarction (HCC) 1997  . Nephrolithiasis   . PAF (paroxysmal atrial fibrillation) (HCC)    a. on amio and pradaxa. b. s/p AVN ablation  . Stroke (HCC) 12/2015 "several mini"   a. on coumadin -->  switched to pradaxa b. s/p Watchman in 05/2016     Social History  Substance Use Topics  . Smoking status: Former Smoker    Packs/day: 0.50    Years: 48.00    Types: Cigarettes    Quit date: 05/24/2017  . Smokeless tobacco: Never Used     Comment: quit 05/25/17  . Alcohol use 0.0 oz/week     Comment: 06/01/2016 "haven't had a drink in years"    Tobacco Cessation: Current smoker who states he has quit x 1 month ( Since hospital discharge)  Goal would be to maintain smoking cessation  Past surgical hx, Family hx, Social hx all reviewed.  Current Outpatient Prescriptions on File Prior to Visit  Medication Sig  . albuterol (PROVENTIL HFA;VENTOLIN HFA) 108 (90 Base) MCG/ACT inhaler Inhale 2 puffs into the lungs every 6 (six) hours as needed for  wheezing or shortness of breath.  [name] albuterol (PROVENTIL) (2.5 MG/3ML) 0.083% nebulizer solution USE ONE VIAL IN NEBULIZER EVERY 4 HOURS AS NEEDED FOR WHEEZING AND FOR SHORTNESS OF BREATH  . amLODipine (NORVASC) 10 MG tablet Take 0.5 tablets (5 mg total) by mouth daily.  [name] aspirin EC 325 MG EC tablet Take 1 tablet (325 mg total) by mouth daily.  . bisoprolol (ZEBETA) 5 MG tablet Take 0.5 tablets (2.5 mg total) by mouth daily.  . budesonide-formoterol (SYMBICORT) 160-4.5 MCG/ACT inhaler Inhale 2 puffs into the lungs 2 (two) times daily.  . citalopram (CELEXA) 10 MG tablet Take 10 mg by mouth daily. Reported on 06/06/2016  . furosemide (LASIX) 20 MG tablet Take 2 tablets (40 mg total) by mouth daily.  . Multiple Vitamin (MULITIVITAMIN WITH MINERALS) TABS Take 1 tablet by mouth daily.  . niacin (NIASPAN) 500 MG CR tablet Take 1,000 mg by mouth 2 (two) times daily.   . nitroGLYCERIN (NITROSTAT) 0.4 MG SL tablet PLACE 1 TABLET (0.4 MG TOTAL) UNDER THE TONGUE EVERY 5 MINUTES AS NEEDED FOR CHEST PAIN. (Patient not taking: Reported on 06/25/2017)  . Potassium Chloride ER 20 MEQ TBCR Take 2 tablets by mouth 3 (three) times daily. PLEASE CONTACT OFFICE FOR ADDITIONAL REFILLS FINAL WARNING  . pravastatin (PRAVACHOL) 80 MG tablet Take 1 tablet (80 mg total) by mouth daily.  . sacubitril-valsartan (ENTRESTO) 49-51 MG Take 1 tablet by mouth 2 (two) times daily.  [name] spironolactone (ALDACTONE) 25 MG tablet Take 1 tablet (25 mg total) by mouth daily. (Patient not taking: Reported on 06/25/2017)  . tiotropium (SPIRIVA HANDIHALER) 18 MCG inhalation capsule Place 1 capsule (18 mcg total) into inhaler and inhale daily.   No current facility-administered medications on file prior to visit.      Allergies  Allergen Reactions  . Buprenorphine Hcl Other (See Comments)    Patient stated that he did not do well on this medication. He felt like climbing the walls  . Morphine And Related Other (See Comments)    Patient  stated that he did not do well on this medication. He felt like climbing the walls    Review Of Systems:  Constitutional:   No  weight loss, night sweats,  Fevers, chills, fatigue, or  lassitude.  HEENT:   No headaches,  Difficulty swallowing,  Tooth/dental problems, or  Sore throat,                No sneezing, itching, ear ache, nasal congestion, post nasal drip,   CV:  No chest pain,  Orthopnea, PND, swelling in lower extremities, anasarca, dizziness, palpitations, syncope.  GI  No heartburn, indigestion, abdominal pain, nausea, vomiting, diarrhea, change in bowel habits, loss of appetite, bloody stools.   Resp: No shortness of breath with exertion or at rest.  No excess mucus, no productive cough,  No non-productive cough,  No coughing up of blood.  No change in color of mucus.  No wheezing.  No chest wall deformity  Skin: no rash or lesions.  GU: no dysuria, change in color of urine, no urgency or frequency.  No flank pain, no hematuria   MS:  No joint pain or swelling.  No decreased range of motion.  No back pain.  Psych:  No change in mood or affect. No depression or anxiety.  No memory loss.   Vital Signs BP 118/70 (BP Location: Left Arm, Patient Position: Sitting, Cuff Size: Normal)   Pulse 75   Ht 6\' 1"  (1.854 m)   Wt 188 lb (85.3 kg)   SpO2 97%   BMI 24.80 kg/m    Physical Exam:  General- No distress,  A&Ox3, pleasant ENT: No sinus tenderness, TM clear, pale nasal mucosa, no oral exudate,no post nasal drip, no LAN Cardiac: S1, S2, regular rate and rhythm, no murmur Chest: No wheeze/ rales/ dullness; no accessory muscle use, no nasal flaring, no sternal retractions Abd.: Soft Non-tender Ext: No clubbing cyanosis, edema Neuro:  normal strength Skin: No rashes, warm and dry Psych: normal mood and behavior   Assessment/Plan  COPD exacerbation (HCC) COPD exacerbation requiring hospitalization 05/24/2017 through 05/29/2017 Resolved, as patient has returned to  baseline and is compliant with his maintenance medications and rescue medication Plan Congratulations on quitting smoking. Keep it up. Continue the Symbicort 2 puffs twuce daily. Continue the Spiriva daily. Use your rescue inhaler and neb treatments as needed. Mucinex for chest congestion, 1200 mg once daily with full glass of water. We will walk you today in the office today to ensure her saturations are not dropping. Follow up with Dr. Delton Coombes in 3 months with PFT's prior. Please contact office for sooner follow up if symptoms do not improve or worsen or seek emergency care     Acute on chronic combined systolic and diastolic CHF ( EF 20-25% with severe posterior MR and RV hypokinesis) Mild pulmonary hypertension( PCWP = 12)  Plan Continue taking Lasix as is ordered Daily weights Follow up with cardiology  Bevelyn Ngo, NP 06/25/2017  4:55 PM

## 2017-06-25 NOTE — Progress Notes (Signed)
I have read the note, and I agree with the clinical assessment and plan.  Maridee Slape A. Lateria Alderman, MD, PhD, FAAN Certified in Neurology, Clinical Neurophysiology, Sleep Medicine, Pain Medicine and Neuroimaging  [facility] [address], [address] [place], [place] [address] [phone]  

## 2017-06-25 NOTE — Progress Notes (Signed)
Cardiology Office Note    Date:  06/26/2017   ID:  KEYGAN DUMOND, DOB Sep 04, 1941, MRN [id number]  PCP:  [name], MD  Cardiologist:  Dr. [name] Electrophysiologist: Dr. [name]  Chief Complaint  Patient presents with  . Follow-up    seen for Dr. [name]    History of Present Illness:  [name] [name] is a 76 y.o. male with PMH of CAD s/p CABG 1998 with LIMA to LAD, SVG to D1, SVG to OM2/OM3, ICM s/p St Jude CRT-D in 2013, chronic combined systolic and diastolic heart failure with baseline EF 25-30%, PAF s/p LAA closure with Watchman in 05/2016 and AVN ablation in 08/2016, COPD, AAA s/p repair in 12/2015, HTN, HLD and tobacco use. Last cardiac catheterization in 2013 showed patent grafts. More recently he was admitted in June 2018 with acute COPD exacerbation and also acute on chronic systolic heart failure. He underwent aggressive diuresis. Right heart cath performed on 05/28/2017 showed cardiac index 2.4, cardiac output 4.9, wedge pressure 12. Echocardiogram obtained on 05/29/2017 showed EF 25-30%, diffuse hypokinesis, mild AR, moderate MR, severely dilated left atrium, PA peak pressure 25 mmHg. He previously was on 60 mg Lasix every other day, this has been changed to 40 mg daily of Lasix along with spironolactone.  I last saw him on 06/12/2017, we discussed various options, eventually we agreed to start on Entresto 49-51mg . Since our last visit, he has been doing very well on current dose of Entresto. Apparently when he went to see his neurologist recently, his blood pressure was 99/62. His blood pressure today has normalized. I will continue him on the moderate dose of Entresto. He has cut back on tobacco use and only use intermittently. I have advised him to stop using cigarette. Otherwise, despite the fact that he gained 2 pounds recently, he is not volume overloaded. I will continue him on the current dose of Lasix. I will obtain a basic metabolic panel today. This complete his  heart failure medication titration. I will defer to Dr. [name] to older a repeat echocardiogram in 3 month.   Past Medical History:  Diagnosis Date  . Aortic dissection (HCC)    H/O focal aortic dissection  . CAD (coronary artery disease)    a. s/p CABG in 1998 with LIMA-LAD, SVG-D1, SVG-OM2-OM3, and SVG-RCA b. cath 10/07:  LM ok, LAD occluded, AV CFX 95%, pRCA occluded; L-LAD, S-OM2/OM3, S-PDA ok c. 2013: cath showing patent grafts  . Cerebrovascular disease    a. s/p prior Left CEA;  followed by Dr. [name]  . CHF (congestive heart failure) (HCC)    a. s/p STJ CRTD  . CKD (chronic kidney disease)   . COPD (chronic obstructive pulmonary disease) (HCC)   . Depression   . DVT (deep venous thrombosis) (HCC)   . History of stomach ulcers   . Hyperlipidemia   . Hypertension   . Iliac artery aneurysm, left (HCC)    followed by Dr. [name]  . Ischemic cardiomyopathy    a. s/p St. Jude CRTD placement in 2013  . LBBB (left bundle branch block)   . Myocardial infarction (HCC) 1997  . Nephrolithiasis   . PAF (paroxysmal atrial fibrillation) (HCC)    a. on amio and pradaxa. b. s/p AVN ablation  . Stroke [facility]) 12/2015 "several mini"   a. on coumadin --> switched to pradaxa b. s/p Watchman in 05/2016    Past Surgical History:  Procedure Laterality Date  . ABDOMINAL AORTIC ANEURYSM REPAIR N/A  01/03/2016   Procedure: ANEURYSM ABDOMINAL AORTA, BILATERAL COMMON ILIAC REPAIR;  Surgeon: [name], MD;  Location: [facility] OR;  Service: Vascular;  Laterality: N/A;  . ABDOMINAL AORTIC ANEURYSM REPAIR  01/03/2016  . APPENDECTOMY  09/11/11  . BACK SURGERY    . BI-VENTRICULAR IMPLANTABLE CARDIOVERTER DEFIBRILLATOR N/A 08/22/2012   SJM Quadra Assura BiV ICD implanted by Dr [name]  . CARDIAC CATHETERIZATION  1998; 2013  . CARDIOVERSION N/A 07/26/2016   Procedure: CARDIOVERSION;  Surgeon: Lars Masson, MD;  Location: [facility] ENDOSCOPY;  Service: Cardiovascular;  Laterality: N/A;  . CAROTID ENDARTERECTOMY  Left July  2005  . CORONARY ARTERY BYPASS GRAFT  1998   CABG X5  . CYSTOSCOPY W/ STONE MANIPULATION  X 2  . ELECTROPHYSIOLOGIC STUDY N/A 08/17/2016   AVN ablation by Dr [name]  . ESOPHAGOGASTRODUODENOSCOPY N/A 02/28/2016   Procedure: ESOPHAGOGASTRODUODENOSCOPY (EGD);  Surgeon: [name], MD;  Location: [facility] ENDOSCOPY;  Service: Endoscopy;  Laterality: N/A;  . LEFT ATRIAL APPENDAGE OCCLUSION N/A 06/01/2016   Watchman device implanted by Dr [name]  . POSTERIOR FUSION LUMBAR SPINE  Sep 27, 2014   L4-L5  . PR VEIN BYPASS GRAFT,AORTO-FEM-POP  01/03/2016  . RIGHT HEART CATH N/A 05/28/2017   Procedure: Right Heart Cath;  Surgeon: Dolores Patty, MD;  Location: [facility] INVASIVE CV LAB;  Service: Cardiovascular;  Laterality: N/A;  . TEE WITHOUT CARDIOVERSION N/A 07/26/2016   Procedure: TRANSESOPHAGEAL ECHOCARDIOGRAM (TEE);  Surgeon: Lars Masson, MD;  Location: [facility] ENDOSCOPY;  Service: Cardiovascular;  Laterality: N/A;    Current Medications: Outpatient Medications Prior to Visit  Medication Sig Dispense Refill  . albuterol (PROVENTIL HFA;VENTOLIN HFA) 108 (90 Base) MCG/ACT inhaler Inhale 2 puffs into the lungs every 6 (six) hours as needed for wheezing or shortness of breath. 1 Inhaler 2  . albuterol (PROVENTIL) (2.5 MG/3ML) 0.083% nebulizer solution USE ONE VIAL IN NEBULIZER EVERY 4 HOURS AS NEEDED FOR WHEEZING AND FOR SHORTNESS OF BREATH 360 mL 11  . amLODipine (NORVASC) 10 MG tablet Take 0.5 tablets (5 mg total) by mouth daily. 30 tablet 6  . aspirin EC 325 MG EC tablet Take 1 tablet (325 mg total) by mouth daily.    . bisoprolol (ZEBETA) 5 MG tablet Take 0.5 tablets (2.5 mg total) by mouth daily. 30 tablet 0  . budesonide-formoterol (SYMBICORT) 160-4.5 MCG/ACT inhaler Inhale 2 puffs into the lungs 2 (two) times daily. 1 Inhaler 6  . citalopram (CELEXA) 10 MG tablet Take 10 mg by mouth daily. Reported on 06/06/2016    . Multiple Vitamin (MULITIVITAMIN WITH MINERALS) TABS Take 1 tablet by  mouth daily.    . niacin (NIASPAN) 500 MG CR tablet Take 1,000 mg by mouth 2 (two) times daily.     . pravastatin (PRAVACHOL) 80 MG tablet Take 1 tablet (80 mg total) by mouth daily. 30 tablet 6  . tiotropium (SPIRIVA HANDIHALER) 18 MCG inhalation capsule Place 1 capsule (18 mcg total) into inhaler and inhale daily. 30 capsule 2  . furosemide (LASIX) 20 MG tablet Take 2 tablets (40 mg total) by mouth daily. 60 tablet 0  . sacubitril-valsartan (ENTRESTO) 49-51 MG Take 1 tablet by mouth 2 (two) times daily. 56 tablet 0  . spironolactone (ALDACTONE) 25 MG tablet Take 1 tablet (25 mg total) by mouth daily. 30 tablet 3  . nitroGLYCERIN (NITROSTAT) 0.4 MG SL tablet PLACE 1 TABLET (0.4 MG TOTAL) UNDER THE TONGUE EVERY 5 MINUTES AS NEEDED FOR CHEST PAIN. (Patient not taking: Reported on 06/25/2017)  100 tablet 0  . Potassium Chloride ER 20 MEQ TBCR Take 2 tablets by mouth 3 (three) times daily. PLEASE CONTACT OFFICE FOR ADDITIONAL REFILLS FINAL WARNING 540 tablet 0   No facility-administered medications prior to visit.      Allergies:   Buprenorphine hcl and Morphine and related   Social History   Social History  . Marital status: Widowed    Spouse name: N/A  . Number of children: 3  . Years of education: 94 TH   Occupational History  . RETIRED---truck driver Retired   Social History Main Topics  . Smoking status: Former Smoker    Packs/day: 0.50    Years: 48.00    Types: Cigarettes    Quit date: 05/24/2017  . Smokeless tobacco: Never Used     Comment: quit 05/25/17  . Alcohol use 0.0 oz/week     Comment: 06/01/2016 "haven't had a drink in years"  . Drug use: No  . Sexual activity: Yes   Other Topics Concern  . None   Social History Narrative   Patient is married with 3 children.   Patient is right handed.   Patient has 10 th grade education.   Patient drinks tea occasionally.   06/25/17 son lives with him     Family History:  The patient's family history includes Stroke in his  maternal uncle and maternal uncle.   ROS:   Please see the history of present illness.    ROS All other systems reviewed and are negative.   PHYSICAL EXAM:   VS:  BP 115/76   Pulse 62   Ht 6\' 1"  (1.854 m)   Wt 190 lb 6.4 oz (86.4 kg)   SpO2 99%   BMI 25.12 kg/m    GEN: Well nourished, well developed, in no acute distress  HEENT: normal  Neck: no JVD, carotid bruits, or masses Cardiac: RRR; no murmurs, rubs, or gallops,no edema  Respiratory:  clear to auscultation bilaterally, normal work of breathing GI: soft, nontender, nondistended, + BS MS: no deformity or atrophy  Skin: warm and dry, no rash Neuro:  Alert and Oriented x 3, Strength and sensation are intact Psych: euthymic mood, full affect  Wt Readings from Last 3 Encounters:  06/26/17 190 lb 6.4 oz (86.4 kg)  06/25/17 190 lb 6.4 oz (86.4 kg)  06/25/17 188 lb (85.3 kg)      Studies/Labs Reviewed:   EKG:  EKG is not ordered today.    Recent Labs: 05/25/2017: B Natriuretic Peptide 2,353.9 05/27/2017: ALT 20; Hemoglobin 13.1; Platelets 190 05/28/2017: Magnesium 1.9 06/19/2017: BUN 33; Creatinine, Ser 1.31; Potassium 5.1; Sodium 145   Lipid Panel    Component Value Date/Time   CHOL 144 12/24/2014 0333   TRIG 154 (H) 12/24/2014 0333   HDL 38 (L) 12/24/2014 0333   CHOLHDL 3.8 12/24/2014 0333   VLDL 31 12/24/2014 0333   LDLCALC 75 12/24/2014 0333   LDLDIRECT 91.8 04/28/2010 0000    Additional studies/ records that were reviewed today include:   Myoview 11/24/2015 Study Highlights    Nuclear stress EF: 25%.  The left ventricular ejection fraction is severely decreased (<30%).  Defect 1: There is a medium defect of severe severity present in the basal inferior, mid inferior and apex location.  Findings consistent with prior myocardial infarction.  This is an intermediate risk study.  1. Intermediate risk study secondary to severe LV dysfunction 2. Inferior scar w/o ischemia consistent with ISCM      Cath 05/28/2017 Conclusion  Findings:  RA = 5 RV = 44/4 PA = 41/18 (26) PCW = 12 (no significant v-waves) Fick cardiac output/index = 4.9/2.4 PVR = 1.5 WU FA sat = 89% PA sat = 56%. 59%  Assessment: 1. Relatively well compensated hemodynamics with mild pulmonary HTN.  2. No significant v-waves in wedge tracing  Plan/Discussion:  He has been well diuresed. Cardiac output is adequate. Can switch back to po diuretics in am. Will need repeat echo to reassess MR now that volume status improved.      Echo 05/29/2017 LV EF: 25% - 30%  Study Conclusions  - Left ventricle: The cavity size was moderately dilated. There was moderate concentric hypertrophy. Systolic function was severely reduced. The estimated ejection fraction was in the range of 25% to 30%. Diffuse hypokinesis. - Aortic valve: Transvalvular velocity was within the normal range. There was no stenosis. There was mild regurgitation. - Mitral valve: Transvalvular velocity was within the normal range. There was no evidence for stenosis. There was moderate regurgitation. - Left atrium: The atrium was severely dilated. - Right ventricle: The cavity size was normal. Wall thickness was normal. Systolic function was moderately reduced. - Right atrium: The atrium was mildly dilated. - Atrial septum: No defect or patent foramen ovale was identified. - Tricuspid valve: There was mild regurgitation. - Pulmonary arteries: Systolic pressure was within the normal range. PA peak pressure: 25 mm Hg (S).    ASSESSMENT:    1. Chronic combined systolic and diastolic heart failure (HCC)   2. Medication management   3. Coronary artery disease involving coronary bypass graft of native heart without angina pectoris   4. PAF (paroxysmal atrial fibrillation) (HCC)   5. Chronic obstructive pulmonary disease, unspecified COPD type (HCC)   6. Moderate mitral regurgitation   7. Essential  hypertension   8. Hyperlipidemia, unspecified hyperlipidemia type   9. S/P AAA repair      PLAN:  In order of problems listed above:  1. Chronic combined systolic and diastolic heart failure: Baseline EF 25-30% on echocardiogram 05/29/2017, previously EF in 2016 was 45%. I have started him on Entresto 49-51 mg. He is currently tolerating this dose, I don't think we can uptitrate it anymore. I will defer to Dr. [name] to order a repeat echocardiogram in 3 month.  2. CAD s/p CABG: No recent angina despite significant drop in EF.  3. Paroxysmal atrial fibrillation: On aspirin, s/p watchman  4. COPD: Status post recent COPD exacerbation. Followed by pulmonology service.  5. Moderate MR: Seen on recent echocardiogram, surprisingly, I cannot hear a heart murmur on physical exam. Hopefully will see improvement on repeat echocardiogram.  6. H/o AAA: s/p repair  7. Hypertension: Blood pressure borderline low during urology office visit recently, today his blood pressure has normalized.  8. Hyperlipidemia: On niacin and Pravachol    Medication Adjustments/Labs and Tests Ordered: Current medicines are reviewed at length with the patient today.  Concerns regarding medicines are outlined above.  Medication changes, Labs and Tests ordered today are listed in the Patient Instructions below. Patient Instructions  Medication Instructions:  Your physician recommends that you continue on your current medications as directed. Please refer to the Current Medication list given to you today.  Labwork: TODAY-BMET  Testing/Procedures: NONE  Follow-Up: Your physician recommends that you schedule a follow-up appointment in: 3 MONTHS with Dr. [name].   Any Other Special Instructions Will Be Listed Below (If Applicable).     If you need a refill on your cardiac medications  before your next appointment, please call your pharmacy.      Ramond Dial, [place]  06/26/2017 7:00 PM    [facility] [address] [place], [place], [place]  [address] Phone: [phone]; Fax: [phone]

## 2017-06-25 NOTE — Assessment & Plan Note (Signed)
COPD exacerbation requiring hospitalization 05/24/2017 through 05/29/2017 Resolved, as patient has returned to baseline and is compliant with his maintenance medications and rescue medication Plan Congratulations on quitting smoking. Keep it up. Continue the Symbicort 2 puffs twuce daily. Continue the Spiriva daily. Use your rescue inhaler and neb treatments as needed. Mucinex for chest congestion, 1200 mg once daily with full glass of water. We will walk you today in the office today to ensure her saturations are not dropping. Follow up with Dr. Delton Coombes in 3 months with PFT's prior. Please contact office for sooner follow up if symptoms do not improve or worsen or seek emergency care

## 2017-06-25 NOTE — Progress Notes (Signed)
[facility]  PATIENT: [name] [name] DOB: [birth year]   REASON FOR VISIT: Follow-up for history of stroke, paroxysmal atrial fibrillation, hypertension, hyperlipidemia vertigo HISTORY FROM: Patient and daughter [name] [name]    HISTORY OF PRESENT ILLNESS:History Summary [name] [name] is a 76 y.o. male with history of atrial fibrillation on Pradaxa, CAD, prior left CEA, AAA, COPD, hyperlipidemia, hypertension, ischemic cardiomyopathy s/p pacemaker and previous stroke was admitted on 01/10/2015 for dizziness and right hemiparesis with hemisensory loss and vomiting. MRI not able to perform due to pacemaker. Initial CT and repeat CT did not show acute abnormalities. Stroke workup including CTA head and neck, carotid Doppler and 2-D echo unremarkable. Neuro exam found to have possible positive Dix-Hallpike and giveaway weakness on the right side. By further questioning, patient has suffered depression since his wife passed away. He was put on Celexa, and referred to outpatient BPPV maneuver. He was discharged in good condition with continuation of his Pradaxa and The statin for stroke prevention.  03/01/15 follow up Dr. Erlinda Hong- the patient has been doing well. He stated that he had worked with PT OT twice a week for maneuver training and vestibular rehabilitation, and he is feeling great. His blood pressure 133/80 in clinic today. He has no weakness or numbness or any neurological deficit. His Celexa has been discontinued by his own. He stated that his depression much better, and he recently met a lady who also recently lost her husband, and they have good relationship each other.  Interval History 07/05/15 DR. Xu During the interval time, the pt has been doing well. He is not depressed anymore and he feel good. He has stopped celexa and currently not on depression meds. His BP at home around 140-150 and today in clinic 145/79. He is doing BPPV maneuver at home. Still on pradaxa and  no complications. UPDATE 07/13/2017CM [name] [name], 76 year old male returns for follow-up.Admitted on 01/10/2015 for dizziness and right hemiparesis with hemisensory loss and vomiting. MRI not able to perform due to pacemaker. Initial CT and repeat CT did not show acute abnormalities. Stroke workup including CTA head and neck, carotid Doppler and 2-D echo unremarkable. Neuro exam found to have possible positive Dix-Hallpike and giveaway weakness on the right side. Blood pressure is controlled today in the office at 131/86. He has not had any stroke or TIA symptoms. He remains on Pradaxa Pravachol and aspirin for secondary stroke prevention. He returns for reevaluation UPDATE 076/2018CM/ [name] [name], 76 year old male returns for follow-up. with history of admission January 2016 for dizziness and right hemiparesis. Unable to obtain MRI due to pacemaker stroke workup was unremarkable. He has not had further stroke or TIA symptoms since that time. He had an admission to the hospital for COPD exacerbation  May 24, 2017. He has a history of cardiomyopathy paroxysmal atrial fibrillation chronic kidney disease stage III .He claims he exercises by walking. Blood pressure in the office today 99/62. He was encouraged to stay hydrated. He remains on aspirin and Pravachol for secondary stroke prevention. He returns for reevaluation REVIEW OF SYSTEMS: Full 14 system review of systems performed and notable only for those listed, all others are neg:  Constitutional: neg  Cardiovascular: neg Ear/Nose/Throat: neg  Skin: neg Eyes: Blurred vision Respiratory: Wheezing Gastroitestinal: neg  Hematology/Lymphatic: Easy bruising  Endocrine: neg Musculoskeletal:neg Allergy/Immunology: neg Neurological: neg Psychiatric: neg Sleep : neg   ALLERGIES: Allergies  Allergen Reactions  . Buprenorphine Hcl Other (See Comments)    Patient stated that he did  not do well on this medication. He felt like climbing the walls  .  Morphine And Related Other (See Comments)    Patient stated that he did not do well on this medication. He felt like climbing the walls    HOME MEDICATIONS: Outpatient Medications Prior to Visit  Medication Sig Dispense Refill  . albuterol (PROVENTIL HFA;VENTOLIN HFA) 108 (90 Base) MCG/ACT inhaler Inhale 2 puffs into the lungs every 6 (six) hours as needed for wheezing or shortness of breath. 1 Inhaler 2  . albuterol (PROVENTIL) (2.5 MG/3ML) 0.083% nebulizer solution USE ONE VIAL IN NEBULIZER EVERY 4 HOURS AS NEEDED FOR WHEEZING AND FOR SHORTNESS OF BREATH 360 mL 11  . amLODipine (NORVASC) 10 MG tablet Take 0.5 tablets (5 mg total) by mouth daily. 30 tablet 6  . aspirin EC 325 MG EC tablet Take 1 tablet (325 mg total) by mouth daily.    . bisoprolol (ZEBETA) 5 MG tablet Take 0.5 tablets (2.5 mg total) by mouth daily. 30 tablet 0  . budesonide-formoterol (SYMBICORT) 160-4.5 MCG/ACT inhaler Inhale 2 puffs into the lungs 2 (two) times daily. 1 Inhaler 6  . citalopram (CELEXA) 10 MG tablet Take 10 mg by mouth daily. Reported on 06/06/2016    . furosemide (LASIX) 20 MG tablet Take 2 tablets (40 mg total) by mouth daily. 60 tablet 0  . Multiple Vitamin (MULITIVITAMIN WITH MINERALS) TABS Take 1 tablet by mouth daily.    . niacin (NIASPAN) 500 MG CR tablet Take 1,000 mg by mouth 2 (two) times daily.     . nitroGLYCERIN (NITROSTAT) 0.4 MG SL tablet PLACE 1 TABLET (0.4 MG TOTAL) UNDER THE TONGUE EVERY 5 MINUTES AS NEEDED FOR CHEST PAIN. 100 tablet 0  . Potassium Chloride ER 20 MEQ TBCR Take 2 tablets by mouth 3 (three) times daily. PLEASE CONTACT OFFICE FOR ADDITIONAL REFILLS FINAL WARNING 540 tablet 0  . pravastatin (PRAVACHOL) 80 MG tablet Take 1 tablet (80 mg total) by mouth daily. 30 tablet 6  . sacubitril-valsartan (ENTRESTO) 49-51 MG Take 1 tablet by mouth 2 (two) times daily. 56 tablet 0  . spironolactone (ALDACTONE) 25 MG tablet Take 1 tablet (25 mg total) by mouth daily. 30 tablet 0  . tiotropium  (SPIRIVA HANDIHALER) 18 MCG inhalation capsule Place 1 capsule (18 mcg total) into inhaler and inhale daily. 30 capsule 2   No facility-administered medications prior to visit.     PAST MEDICAL HISTORY: Past Medical History:  Diagnosis Date  . Aortic dissection (HCC)    H/O focal aortic dissection  . CAD (coronary artery disease)    a. s/p CABG in 1998 with LIMA-LAD, SVG-D1, SVG-OM2-OM3, and SVG-RCA b. cath 10/07:  LM ok, LAD occluded, AV CFX 95%, pRCA occluded; L-LAD, S-OM2/OM3, S-PDA ok c. 2013: cath showing patent grafts  . Cerebrovascular disease    a. s/p prior Left CEA;  followed by Dr. Donnetta Hutching  . CHF (congestive heart failure) (HCC)    a. s/p STJ CRTD  . CKD (chronic kidney disease)   . COPD (chronic obstructive pulmonary disease) ([place])   . Depression   . DVT (deep venous thrombosis) ([facility])   . History of stomach ulcers   . Hyperlipidemia   . Hypertension   . Iliac artery aneurysm, left (HCC)    followed by Dr. Donnetta Hutching  . Ischemic cardiomyopathy    a. s/p St. Jude CRTD placement in 2013  . LBBB (left bundle branch block)   . Myocardial infarction ([place]) 1997  . Nephrolithiasis   .  PAF (paroxysmal atrial fibrillation) (HCC)    a. on amio and pradaxa. b. s/p AVN ablation  . Stroke [facility]) 12/2015 "several mini"   a. on coumadin --> switched to pradaxa b. s/p Watchman in 05/2016    PAST SURGICAL HISTORY: Past Surgical History:  Procedure Laterality Date  . ABDOMINAL AORTIC ANEURYSM REPAIR N/A 01/03/2016   Procedure: ANEURYSM ABDOMINAL AORTA, BILATERAL COMMON ILIAC REPAIR;  Surgeon: Rosetta Posner, MD;  Location: [facility];  Service: Vascular;  Laterality: N/A;  . ABDOMINAL AORTIC ANEURYSM REPAIR  01/03/2016  . APPENDECTOMY  09/11/11  . BACK SURGERY    . BI-VENTRICULAR IMPLANTABLE CARDIOVERTER DEFIBRILLATOR N/A 08/22/2012   SJM Quadra Assura BiV ICD implanted by Dr Rayann Heman  . CARDIAC CATHETERIZATION  1998; 2013  . CARDIOVERSION N/A 07/26/2016   Procedure: CARDIOVERSION;  Surgeon:  Dorothy Spark, MD;  Location: [facility];  Service: Cardiovascular;  Laterality: N/A;  . CAROTID ENDARTERECTOMY Left July  2005  . CORONARY ARTERY BYPASS GRAFT  1998   CABG X5  . CYSTOSCOPY W/ STONE MANIPULATION  X 2  . ELECTROPHYSIOLOGIC STUDY N/A 08/17/2016   AVN ablation by Dr Rayann Heman  . ESOPHAGOGASTRODUODENOSCOPY N/A 02/28/2016   Procedure: ESOPHAGOGASTRODUODENOSCOPY (EGD);  Surgeon: Ladene Artist, MD;  Location[facility] ENDOSCOPY;  Service: Endoscopy;  Laterality: N/A;  . LEFT ATRIAL APPENDAGE OCCLUSION N/A 06/01/2016   Watchman device implanted by Dr Rayann Heman  . POSTERIOR FUSION LUMBAR SPINE  Sep 27, 2014   L4-L5  . PR VEIN BYPASS GRAFT,AORTO-FEM-POP  01/03/2016  . RIGHT HEART CATH N/A 05/28/2017   Procedure: Right Heart Cath;  Surgeon: Jolaine Artist, MD;  Location: [facility];  Service: Cardiovascular;  Laterality: N/A;  . TEE WITHOUT CARDIOVERSION N/A 07/26/2016   Procedure: TRANSESOPHAGEAL ECHOCARDIOGRAM (TEE);  Surgeon: Dorothy Spark, MD;  Location: [facility]OSCOPY;  Service: Cardiovascular;  Laterality: N/A;    FAMILY HISTORY: Family History  Problem Relation Age of Onset  . Stroke Maternal Uncle   . Stroke Maternal Uncle   . Heart attack Neg Hx   . Hypertension Neg Hx     SOCIAL HISTORY: Social History   Social History  . Marital status: Widowed    Spouse name: N/A  . Number of children: 3  . Years of education: 3 [place]   Occupational History  . RETIRED---truck driver Retired   Social History Main Topics  . Smoking status: Former Smoker    Packs/day: 0.50    Years: 48.00    Types: Cigarettes    Quit date: 05/24/2017  . Smokeless tobacco: Never Used     Comment: quit 05/25/17  . Alcohol use 0.0 oz/week     Comment: 06/01/2016 "haven't had a drink in years"  . Drug use: No  . Sexual activity: Yes   Other Topics Concern  . Not on file   Social History Narrative   Patient is married with 3 children.   Patient is right handed.   Patient has 10 th  grade education.   Patient drinks tea occasionally.   06/25/17 son lives with him     PHYSICAL EXAM  Vitals:   06/25/17 1108  BP: 99/62  Pulse: 78  Weight: 190 lb 6.4 oz (86.4 kg)   Body mass index is 25.12 kg/m.  Generalized: Well developed, in no acute distress  Head: normocephalic and atraumatic,. Oropharynx benign  Neck: Supple, no carotid bruits  Cardiac: Regular rate rhythm, no murmur  Musculoskeletal: No deformity   Neurological examination  Mentation: Alert oriented to time, place, history taking. Attention span and concentration appropriate. Recent and remote memory intact.  Follows all commands speech and language fluent.   Cranial nerve II-XII: Pupils were equal round reactive to light extraocular movements were full, visual field were full on confrontational test. Facial sensation and strength were normal. hearing was intact to finger rubbing bilaterally. Uvula tongue midline. head turning and shoulder shrug were normal and symmetric.Tongue protrusion into cheek strength was normal. Motor: normal bulk and tone, full strength in the BUE, BLE, fine finger movements normal, no pronator drift. No focal weakness Sensory: normal and symmetric to light touch, pinprick, and  Vibration, In the upper and lower extremities  Coordination: finger-nose-finger, heel-to-shin bilaterally, no dysmetria Reflexes: 1+ upper lower and symmetric plantar responses were flexor bilaterally. Gait and Station: Rising up from seated position without assistance, normal stance,  moderate stride, good arm swing, smooth turning, able to perform tiptoe, and heel walking without difficulty. Tandem gait is steady and Romberg is negative  DIAGNOSTIC DATA (LABS, IMAGING, TESTING) - I reviewed patient records, labs, notes, testing and imaging myself where available.  Lab Results  Component Value Date   WBC 9.5 05/27/2017   HGB 13.1 05/27/2017   HCT 42.3 05/27/2017   MCV 87.8 05/27/2017   PLT 190  05/27/2017      Component Value Date/Time   NA 145 (H) 06/19/2017 0955   K 5.1 06/19/2017 0955   CL 105 06/19/2017 0955   CO2 25 06/19/2017 0955   GLUCOSE 84 06/19/2017 0955   GLUCOSE 144 (H) 05/29/2017 0229   BUN 33 (H) 06/19/2017 0955   CREATININE 1.31 (H) 06/19/2017 0955   CREATININE 1.23 (H) 08/15/2016 0902   CALCIUM 9.0 06/19/2017 0955   PROT 6.0 (L) 05/27/2017 0548   ALBUMIN 3.3 (L) 05/27/2017 0548   AST 19 05/27/2017 0548   ALT 20 05/27/2017 0548   ALKPHOS 72 05/27/2017 0548   BILITOT 0.8 05/27/2017 0548   GFRNONAA 53 (L) 06/19/2017 0955   GFRNONAA 58 (L) 02/05/2015 1215   GFRAA 62 06/19/2017 0955   GFRAA 67 02/05/2015 1215   Lab Results  Component Value Date   CHOL 144 12/24/2014   HDL 38 (L) 12/24/2014   LDLCALC 75 12/24/2014   LDLDIRECT 91.8 04/28/2010   TRIG 154 (H) 12/24/2014   CHOLHDL 3.8 12/24/2014    Lab Results  Component Value Date   TSH 0.84 05/10/2016      ASSESSMENT AND PLAN  76 y.o. year old male  has a past medical history of  PMH of afib CAD, prior left CEA, AAA, COPD, HLD, HTN, ischemic cardiomyopathy s/p pacemaker and previous strokewas admitted on 01/10/2015 for dizziness and right hemiparesis with hemisensory loss and vomiting. MRI not able to perform due to pacemaker. Initial CT and repeat CT did not show acute abnormalities. Stroke workup including CTA head and neck, carotid Doppler and 2-D echo unremarkable. Recent hospital admission for COPD exacerbation.  The patient is a current patient of Dr. Erlinda Hong  who is out of the office today . This note is sent to the work in doctor.      PLAN continue  aspirin, pravachol and Niacin for stroke prevention continue to follow up with cardiology for intermittent afib and pacemaker management Continue entresto as ordered by cardiology Keep systolic blood pressure less than 140/80  today's reading 99/62 please stay well hydrated  Follow up with your primary care physician for stroke risk factor  modification. lipids with LDL cholesterol  goal below 70 mg/dL. Moderate exercise if tolerated  Will discharge at that time from stroke clinic A total of 25 minutes was spent face-to-face with this patient. Over half this time was spent on counseling patient on the stroke and management of risk factors Dennie Bible, [facility], [facility], APRN  [facility] [address] [place], [place] [address] [phone]

## 2017-06-25 NOTE — Patient Instructions (Addendum)
continue  and aspirin, pravachol for stroke prevention continue to follow up with cardiology for intermittent afib and pacemaker management Continue entresto as ordered by cardiology Keep systolic blood pressure less than 140/80  today's reading 99/62 please stay well hydrated  Follow up with your primary care physician for stroke risk factor modification. lipids with LDL cholesterol goal below 70 mg/dL. Moderate exercise if tolerated  Will discharge at that time from stroke clinic  Stroke Prevention Some health problems and behaviors may make it more likely for you to have a stroke. Below are ways to lessen your risk of having a stroke.  Be active for at least 30 minutes on most or all days.  Do not smoke. Try not to be around others who smoke.  Do not drink too much alcohol. ? Do not have more than 2 drinks a day if you are a man. ? Do not have more than 1 drink a day if you are a woman and are not pregnant.  Eat healthy foods, such as fruits and vegetables. If you were put on a specific diet, follow the diet as told.  Keep your cholesterol levels under control through diet and medicines. Look for foods that are low in saturated fat, trans fat, cholesterol, and are high in fiber.  If you have diabetes, follow all diet plans and take your medicine as told.  Ask your doctor if you need treatment to lower your blood pressure. If you have high blood pressure (hypertension), follow all diet plans and take your medicine as told by your doctor.  If you are 3-64 years old, have your blood pressure checked every 3-5 years. If you are age 4 or older, have your blood pressure checked every year.  Keep a healthy weight. Eat foods that are low in calories, salt, saturated fat, trans fat, and cholesterol.  Do not take drugs.  Avoid birth control pills, if this applies. Talk to your doctor about the risks of taking birth control pills.  Talk to your doctor if you have sleep problems (sleep  apnea).  Take all medicine as told by your doctor. ? You may be told to take aspirin or blood thinner medicine. Take this medicine as told by your doctor. ? Understand your medicine instructions.  Make sure any other conditions you have are being taken care of.  Get help right away if:  You suddenly lose feeling (you feel numb) or have weakness in your face, arm, or leg.  Your face or eyelid hangs down to one side.  You suddenly feel confused.  You have trouble talking (aphasia) or understanding what people are saying.  You suddenly have trouble seeing in one or both eyes.  You suddenly have trouble walking.  You are dizzy.  You lose your balance or your movements are clumsy (uncoordinated).  You suddenly have a very bad headache and you do not know the cause.  You have new chest pain.  Your heart feels like it is fluttering or skipping a beat (irregular heartbeat). Do not wait to see if the symptoms above go away. Get help right away. Call your local emergency services (911 in [place] Do not drive yourself to the hospital. This information is not intended to replace advice given to you by your health care provider. Make sure you discuss any questions you have with your health care provider. Document Released: 05/28/2012 Document Revised: 05/04/2016 Document Reviewed: 05/30/2013 Elsevier Interactive Patient Education  [employer].

## 2017-06-26 ENCOUNTER — Ambulatory Visit (INDEPENDENT_AMBULATORY_CARE_PROVIDER_SITE_OTHER): Payer: Medicare Other | Admitting: Physician Assistant

## 2017-06-26 ENCOUNTER — Encounter: Payer: Self-pay | Admitting: Physician Assistant

## 2017-06-26 ENCOUNTER — Telehealth: Payer: Self-pay | Admitting: Cardiology

## 2017-06-26 VITALS — BP 115/76 | HR 62 | Ht 73.0 in | Wt 190.4 lb

## 2017-06-26 DIAGNOSIS — I10 Essential (primary) hypertension: Secondary | ICD-10-CM | POA: Diagnosis not present

## 2017-06-26 DIAGNOSIS — Z9889 Other specified postprocedural states: Secondary | ICD-10-CM

## 2017-06-26 DIAGNOSIS — Z79899 Other long term (current) drug therapy: Secondary | ICD-10-CM | POA: Diagnosis not present

## 2017-06-26 DIAGNOSIS — E785 Hyperlipidemia, unspecified: Secondary | ICD-10-CM

## 2017-06-26 DIAGNOSIS — I340 Nonrheumatic mitral (valve) insufficiency: Secondary | ICD-10-CM | POA: Diagnosis not present

## 2017-06-26 DIAGNOSIS — I5023 Acute on chronic systolic (congestive) heart failure: Secondary | ICD-10-CM

## 2017-06-26 DIAGNOSIS — J449 Chronic obstructive pulmonary disease, unspecified: Secondary | ICD-10-CM | POA: Diagnosis not present

## 2017-06-26 DIAGNOSIS — I25810 Atherosclerosis of coronary artery bypass graft(s) without angina pectoris: Secondary | ICD-10-CM | POA: Diagnosis not present

## 2017-06-26 DIAGNOSIS — I5042 Chronic combined systolic (congestive) and diastolic (congestive) heart failure: Secondary | ICD-10-CM | POA: Diagnosis not present

## 2017-06-26 DIAGNOSIS — I480 Paroxysmal atrial fibrillation: Secondary | ICD-10-CM

## 2017-06-26 DIAGNOSIS — Z8679 Personal history of other diseases of the circulatory system: Secondary | ICD-10-CM

## 2017-06-26 MED ORDER — SPIRONOLACTONE 25 MG PO TABS: 25.0000 mg | ORAL_TABLET | Freq: Every day | ORAL | 3 refills | Status: DC

## 2017-06-26 MED ORDER — FUROSEMIDE 20 MG PO TABS: 40.0000 mg | ORAL_TABLET | Freq: Every day | ORAL | 3 refills | Status: DC

## 2017-06-26 MED ORDER — SACUBITRIL-VALSARTAN 49-51 MG PO TABS: 1.0000 | ORAL_TABLET | Freq: Two times a day (BID) | ORAL | 3 refills | Status: DC

## 2017-06-26 NOTE — Patient Instructions (Signed)
Medication Instructions:  Your physician recommends that you continue on your current medications as directed. Please refer to the Current Medication list given to you today.  Labwork: TODAY-BMET  Testing/Procedures: NONE  Follow-Up: Your physician recommends that you schedule a follow-up appointment in: 3 MONTHS with Dr. [name].   Any Other Special Instructions Will Be Listed Below (If Applicable).     If you need a refill on your cardiac medications before your next appointment, please call your pharmacy.

## 2017-06-26 NOTE — Telephone Encounter (Signed)
Patient's daughter called in asking for a refill on Spironolactone and Furosemide. Refills have been placed.  Samples of Entresto have been left at the front for the patient. The daughter stated that he needs a prior authorization done for this medication. Will route to the nurse for her information.  Medication Samples have been provided to the patient.  Drug name: Sherryll Burger       Strength: 49-51        Qty: 2 boxes  LOT: [id number]  Exp.Date: 10/19

## 2017-06-26 NOTE — Telephone Encounter (Signed)
New Message ° ° pt daughter verbalized that she is returning call for rn  °

## 2017-06-27 LAB — BASIC METABOLIC PANEL
BUN / CREAT RATIO: 24 (ref 10–24)
BUN: 27 mg/dL (ref 8–27)
CALCIUM: 9.1 mg/dL (ref 8.6–10.2)
CHLORIDE: 102 mmol/L (ref 96–106)
CO2: 24 mmol/L (ref 20–29)
Creatinine, Ser: 1.13 mg/dL (ref 0.76–1.27)
GFR, EST AFRICAN AMERICAN: 74 mL/min/{1.73_m2} (ref >59)
GFR, EST NON AFRICAN AMERICAN: 64 mL/min/{1.73_m2} (ref >59)
Glucose: 81 mg/dL (ref 65–99)
Potassium: 5.1 mmol/L (ref 3.5–5.2)
Sodium: 143 mmol/L (ref 134–144)

## 2017-06-27 LAB — PLEASE NOTE

## 2017-06-27 NOTE — Telephone Encounter (Signed)
Left message for dtr to call with pharmacy info

## 2017-06-28 NOTE — Telephone Encounter (Signed)
Sent PA information through cover my meds for entresto.

## 2017-06-29 NOTE — Telephone Encounter (Signed)
PA sent 06-28-17 by Dr Ludwig Clarks nurse. Left detailed message for daughter

## 2017-06-29 NOTE — Telephone Encounter (Signed)
Follow up    Pt daughter is calling asking for a call back.

## 2017-07-02 ENCOUNTER — Telehealth: Payer: Self-pay | Admitting: Cardiology

## 2017-07-02 ENCOUNTER — Other Ambulatory Visit: Payer: Self-pay | Admitting: Physician Assistant

## 2017-07-02 NOTE — Telephone Encounter (Signed)
I called Covermymeds - this is not a time-sensitive review. Will discuss w Wynema Birch and complete ppw.

## 2017-07-02 NOTE — Telephone Encounter (Signed)
Please call if you have any questions . Thanks

## 2017-07-02 NOTE — Telephone Encounter (Signed)
[name]( Cover My Meds) is calling because an Authorization was started on  Mr. [name] and the plan is calling for additional Clinical information . Needs to get back into the account and complete it . The key for the request is [id number]. Please

## 2017-07-02 NOTE — Telephone Encounter (Signed)
[name] [name], just let me know supporting diagnosis for Entresto and meds tried and failed. I can complete Prior Auth.

## 2017-07-02 NOTE — Telephone Encounter (Signed)
I tried to call the Prior Auth line for [employer] in Pharmacy [phone], but apparently his zip code does not match their record. I tried to call Mr. Segall to verify zip code, but unable to reach him. Surprisingly his insurance is [employer] despite pharmacy listed as [employer] in Pharmacy. Diagnosis should be chronic systolic heart failure. Will try again after discussing with Harrold Donath tomorrow.

## 2017-07-03 NOTE — Telephone Encounter (Signed)
Called and clarified. Reason the address is different is b/c patient moved to live with his daughter at following address:  [address] [place] [address]  He will need his meds shipped there.

## 2017-07-04 NOTE — Telephone Encounter (Signed)
Called patient.  Prior address (what insurance should have on file for verification purpose):  [address] [place] [address]

## 2017-07-04 NOTE — Telephone Encounter (Signed)
I have called in prior authorization, reference number: [id number]

## 2017-07-09 ENCOUNTER — Ambulatory Visit (INDEPENDENT_AMBULATORY_CARE_PROVIDER_SITE_OTHER): Payer: Medicare Other | Admitting: *Deleted

## 2017-07-09 DIAGNOSIS — Z95810 Presence of automatic (implantable) cardiac defibrillator: Secondary | ICD-10-CM

## 2017-07-09 DIAGNOSIS — I255 Ischemic cardiomyopathy: Secondary | ICD-10-CM

## 2017-07-09 DIAGNOSIS — I5042 Chronic combined systolic (congestive) and diastolic (congestive) heart failure: Secondary | ICD-10-CM

## 2017-07-10 NOTE — Progress Notes (Signed)
EPIC Encounter for ICM Monitoring  Patient Name: [name] [name] is a 76 y.o. male Date: 07/10/2017 Primary Care Physican: [name], MD Primary Cardiologist:Crenshaw Electrophysiologist: [name] Dry Weight:unknown Bi-V Pacing: 97% AT/AF Burden >99%      Heart Failure questions reviewed, pt asymptomatic.   Thoracic impedance normal .  Prescribed dosage: Furosemide 20 mg 2 tablets (40 mg total) daily. Potassium 20 mEq 2 tablets 3 times a day.   Labs: 02/01/2017 Creatinine 1.44, BUN 23, Potassium 3.2. Sodium 146 08/15/2016 Creatinine 1.23, BUN 21, Potassium 3.6, Sodium 146  08/09/2016 Creatinine 1.36, BUN 21, Potassium 2.7, Sodium 143  07/10/2016 Creatinine 1.72, BUN 33, Potassium 5.1, Sodium 143  06/02/2016 Creatinine 1.44, BUN 20, Potassium 3.0, Sodium 144   Recommendations: No changes.   Encouraged to call for fluid symptoms.  Follow-up plan: ICM clinic phone appointment on 08/10/2017.    Copy of ICM check sent to device physician.   3 month ICM trend: 07/10/2017   1 Year ICM trend:      Karie Soda, RN 07/10/2017 3:13 PM

## 2017-07-10 NOTE — Progress Notes (Signed)
Remote ICD transmission.   

## 2017-07-11 NOTE — Telephone Encounter (Signed)
Received report from [facility] informing of Entresto approval through 07/05/2019.

## 2017-07-11 NOTE — Telephone Encounter (Signed)
I have left pt a msg to inform him of PA approval and advised in VM to call if questions.

## 2017-07-13 ENCOUNTER — Encounter: Payer: Self-pay | Admitting: Cardiology

## 2017-07-27 ENCOUNTER — Other Ambulatory Visit: Payer: Self-pay | Admitting: Cardiology

## 2017-07-27 MED ORDER — BISOPROLOL FUMARATE 5 MG PO TABS: 2.5000 mg | ORAL_TABLET | Freq: Every day | ORAL | 6 refills | Status: DC

## 2017-07-27 NOTE — Telephone Encounter (Signed)
°  New Prob   *STAT* If patient is at the pharmacy, call can be transferred to refill team.   1. Which medications need to be refilled? (please list name of each medication and dose if known) Bisoprolol 5 mg  2. Which pharmacy/location (including street and city if local pharmacy) is medication to be sent to? [facility] in [place], [place]  3. Do they need a 30 day or 90 day supply? 30 days

## 2017-07-27 NOTE — Telephone Encounter (Signed)
Spoke with patient Daughter, Chad Cordial, notidied rx's sent to pharmacy.

## 2017-08-14 LAB — CUP PACEART REMOTE DEVICE CHECK
HighPow Impedance: 74 Ohm
HighPow Impedance: 74 Ohm
Implantable Lead Implant Date: 20130912
Implantable Pulse Generator Implant Date: 20130912
Lead Channel Impedance Value: 1175 Ohm
Lead Channel Impedance Value: 490 Ohm
Lead Channel Impedance Value: 510 Ohm
Lead Channel Pacing Threshold Amplitude: 2 V
Lead Channel Pacing Threshold Pulse Width: 0.5 ms
Lead Channel Pacing Threshold Pulse Width: 0.8 ms
Lead Channel Setting Pacing Amplitude: 3 V
Lead Channel Setting Pacing Pulse Width: 0.5 ms
Lead Channel Setting Sensing Sensitivity: 0.5 mV
MDC IDC LEAD IMPLANT DT: 20130912
MDC IDC LEAD IMPLANT DT: 20130912
MDC IDC LEAD LOCATION: 753858
MDC IDC LEAD LOCATION: 753859
MDC IDC LEAD LOCATION: 753860
MDC IDC MSMT BATTERY REMAINING LONGEVITY: 25 mo
MDC IDC MSMT BATTERY REMAINING PERCENTAGE: 34 %
MDC IDC MSMT BATTERY VOLTAGE: 2.89 V
MDC IDC MSMT LEADCHNL RA SENSING INTR AMPL: 1.8 mV
MDC IDC MSMT LEADCHNL RV PACING THRESHOLD AMPLITUDE: 0.5 V
MDC IDC MSMT LEADCHNL RV SENSING INTR AMPL: 11.7 mV
MDC IDC PG SERIAL: 1075181
MDC IDC SESS DTM: 20180730080016
MDC IDC SET LEADCHNL LV PACING PULSEWIDTH: 0.8 ms
MDC IDC SET LEADCHNL RV PACING AMPLITUDE: 2 V

## 2017-08-16 NOTE — Progress Notes (Signed)
No ICM remote transmission received for 08/09/2017 and next ICM transmission scheduled for 08/30/2017.

## 2017-08-28 ENCOUNTER — Encounter: Payer: Self-pay | Admitting: *Deleted

## 2017-08-28 DIAGNOSIS — Z006 Encounter for examination for normal comparison and control in clinical research program: Secondary | ICD-10-CM

## 2017-08-28 NOTE — Progress Notes (Signed)
Galactic Heart Failure ResearchInformed Consent  Subject Name: [name] [name]  Subject met inclusion and exclusion criteria. The informed consent form, study requirements and expectations were reviewed with the subject and questions and concerns were addressed prior to the signing of the consent form. The subject verbalized understanding of the trail requirements. The subject agreed to participate in the [place] and signed the informed consent. The informed consent was obtained prior to performance of any protocol-specific procedures for the subject. A copy of the signed informed consent was given to the subject.  Duncan Dull, RN 08/28/2017

## 2017-08-30 ENCOUNTER — Ambulatory Visit (INDEPENDENT_AMBULATORY_CARE_PROVIDER_SITE_OTHER): Payer: Medicare Other

## 2017-08-30 DIAGNOSIS — I5042 Chronic combined systolic (congestive) and diastolic (congestive) heart failure: Secondary | ICD-10-CM | POA: Diagnosis not present

## 2017-08-30 DIAGNOSIS — Z95810 Presence of automatic (implantable) cardiac defibrillator: Secondary | ICD-10-CM | POA: Diagnosis not present

## 2017-08-30 NOTE — Progress Notes (Signed)
EPIC Encounter for ICM Monitoring  Patient Name: [name] [name] is a 76 y.o. male Date: 08/30/2017 Primary Care Physican: [name], MD Primary Cardiologist:Crenshaw Electrophysiologist: [name] Dry Weight:unknown Bi-V Pacing: 97% AT/AF Burden >99%      Heart Failure questions reviewed, pt asymptomatic.   Thoracic impedance normal  Prescribed dosage: Furosemide 20 mg 2tablets (40 mg total) daily. Potassium 20 mEq 2 tablets 3 times a day.   Labs: 02/01/2017 Creatinine 1.44, BUN 23, Potassium 3.2. Sodium 146 08/15/2016 Creatinine 1.23, BUN 21, Potassium 3.6, Sodium 146  08/09/2016 Creatinine 1.36, BUN 21, Potassium 2.7, Sodium 143  07/10/2016 Creatinine 1.72, BUN 33, Potassium 5.1, Sodium 143  06/02/2016 Creatinine 1.44, BUN 20, Potassium 3.0, Sodium 144   Recommendations: No changes.   Encouraged to call for fluid symptoms.  Follow-up plan: ICM clinic phone appointment on 10/08/2017.    Copy of ICM check sent to Dr. [name].   3 month ICM trend: 08/30/2017   1 Year ICM trend:      Karie Soda, RN 08/30/2017 1:06 PM

## 2017-08-31 ENCOUNTER — Other Ambulatory Visit: Payer: Self-pay | Admitting: *Deleted

## 2017-08-31 ENCOUNTER — Telehealth: Payer: Self-pay | Admitting: *Deleted

## 2017-08-31 MED ORDER — POTASSIUM CHLORIDE CRYS ER 20 MEQ PO TBCR: 20.0000 meq | EXTENDED_RELEASE_TABLET | Freq: Every day | ORAL | 0 refills | Status: DC

## 2017-08-31 NOTE — Telephone Encounter (Signed)
Study labs resulted and potassium elevated at 5.3 mEq/L.  Spoke with study PI, McLean, and order to discontinue potassium supplements and re-check labs next week.  Will have labs scanned into chart.  Subject qualifies for research study based on additional labs collected.  Spoke with Mr. Markwood and scheduled appointment for Wednesday, Sept 26th at 11:00.  Also, told Mr. Yebra to stop taking potassium; subject verbalized understanding.  Will send Epic message to Dr. [name] and PA Lisabeth Devoid.

## 2017-08-31 NOTE — Telephone Encounter (Signed)
Received orders from Dr. [name] to continue potassium 20 mEq daily.  Attempted to contact [name] [name]; left message to take potassium 20 mEq daily.  Will follow up with labs next week.

## 2017-09-04 NOTE — Progress Notes (Signed)
HPI: Fu CAD s/p CABG, ischemic CM, combined systolic and diastolic CHF, AF, COPD, s/p CRT-D, s/p AV node ablation.He also has a history of focal aortic dissection being managed medically. He also has a hx of CEA. He underwent aortic and iliac artery aneurysm repair by Dr. [name] in 1/17 with a aorta to bilateral CIA bypass. Watchman device implant (placed 6/17). Admitted 6/18 with CHF and improved with diuresis; noted to have severe MR and plan was fu echo and if MR remains severe when CHF compensated, consider mitraclip. Meds have been titrated as outpt.  Since last seen, patient has mild dyspnea on exertion but no orthopnea, PND, pedal edema, chest pain or syncope.  Echo 6/18 showed EF 20-25, mild LVE, moderate LVH, dilated aortic root (4.7 cm), severe MR, severe LAE, mild RVE with moderately reduced function, severe RAE; moderate TR.  Myoview 12/16 EF 25%, inf scar, no ischemia, Intermediate Risk due to reduced EF  Carotid US 11/16 R < 40%; L CEA patent with < 40%  TEE 8/17 EF 25-30; mild to moderate MR; watchman device in place with residual lobe not covered by device   LHC 8/13 Left mainstem: LM normal.  Left anterior descending (LAD): 100 proximal. LAD fills via LIMA. No high grade distal disease. D1 moderated sized and fills via SVG. Left circumflex (LCx): AV groove diffuse luminal irregularities. Mid 25%. Small OM2 with ostial 95%. MOM with subtotal stenosis. There is filling of two moderate sized OMs with SVG. Both of these vessels are occluded proximally but demonstrated no high grade disease after the graft anastomosis.  Right coronary artery (RCA): Occluded proximally. PDA is moderate sized and fills via SVG. Two moderate sized PLs backfill via this graft and are free if disease. Grafts:  LIMA: Patent to the LAD. SVG to Diag: Patent with proximal circumferential 50% proximal stenosis and mild luminal irregularities. SVG to OM x 2: Patent with  mild luminal irregularities. SVG to RCA: Mild luminal irregularities with mid 40% stenosis. Left ventriculography: Left ventricular systolic function is reduced, LVEF is estimated at 35%, there is no significant mitral regurgitation.   RHC 6/18 showed: RA = 5 RV = 44/4 PA = 41/18 (26) PCW = 12 (no significant v-waves) Fick cardiac output/index = 4.9/2.4 PVR = 1.5 WU FA sat = 89% PA sat = 56%  Current Outpatient Prescriptions  Medication Sig Dispense Refill  . albuterol (PROVENTIL HFA;VENTOLIN HFA) 108 (90 Base) MCG/ACT inhaler Inhale 2 puffs into the lungs every 6 (six) hours as needed for wheezing or shortness of breath. 1 Inhaler 2  . albuterol (PROVENTIL) (2.5 MG/3ML) 0.083% nebulizer solution USE ONE VIAL IN NEBULIZER EVERY 4 HOURS AS NEEDED FOR WHEEZING AND FOR SHORTNESS OF BREATH 360 mL 11  . amLODipine (NORVASC) 10 MG tablet Take 0.5 tablets (5 mg total) by mouth daily. 30 tablet 6  . aspirin EC 325 MG EC tablet Take 1 tablet (325 mg total) by mouth daily.    . bisoprolol (ZEBETA) 5 MG tablet Take 0.5 tablets (2.5 mg total) by mouth daily. 30 tablet 6  . budesonide-formoterol (SYMBICORT) 160-4.5 MCG/ACT inhaler Inhale 2 puffs into the lungs 2 (two) times daily. 1 Inhaler 6  . citalopram (CELEXA) 10 MG tablet Take 10 mg by mouth daily. Reported on 06/06/2016    . furosemide (LASIX) 20 MG tablet Take 2 tablets (40 mg total) by mouth daily. 60 tablet 3  . Multiple Vitamin (MULITIVITAMIN WITH MINERALS) TABS Take 1 tablet by mouth daily.    [name]  niacin (NIASPAN) 500 MG CR tablet Take 1,000 mg by mouth 2 (two) times daily.     . nitroGLYCERIN (NITROSTAT) 0.4 MG SL tablet Place 0.4 mg under the tongue every 5 (five) minutes as needed for chest pain. X 3 doses    . potassium chloride SA (K-DUR,KLOR-CON) 20 MEQ tablet Take 1 tablet (20 mEq total) by mouth daily. 30 tablet 0  . pravastatin (PRAVACHOL) 80 MG tablet Take 1 tablet (80 mg total) by mouth daily. 30 tablet 6  .  sacubitril-valsartan (ENTRESTO) 49-51 MG Take 1 tablet by mouth 2 (two) times daily. 60 tablet 3  . spironolactone (ALDACTONE) 25 MG tablet TAKE 1 TABLET BY MOUTH ONCE DAILY 90 tablet 3  . tiotropium (SPIRIVA HANDIHALER) 18 MCG inhalation capsule Place 1 capsule (18 mcg total) into inhaler and inhale daily. 30 capsule 2   No current facility-administered medications for this visit.      Past Medical History:  Diagnosis Date  . Aortic dissection (HCC)    H/O focal aortic dissection  . CAD (coronary artery disease)    a. s/p CABG in 1998 with LIMA-LAD, SVG-D1, SVG-OM2-OM3, and SVG-RCA b. cath 10/07:  LM ok, LAD occluded, AV CFX 95%, pRCA occluded; L-LAD, S-OM2/OM3, S-PDA ok c. 2013: cath showing patent grafts  . Cerebrovascular disease    a. s/p prior Left CEA;  followed by Dr. [name]  . CHF (congestive heart failure) (HCC)    a. s/p STJ CRTD  . CKD (chronic kidney disease)   . COPD (chronic obstructive pulmonary disease) (HCC)   . Depression   . DVT (deep venous thrombosis) (HCC)   . History of stomach ulcers   . Hyperlipidemia   . Hypertension   . Iliac artery aneurysm, left (HCC)    followed by Dr. [name]  . Ischemic cardiomyopathy    a. s/p St. Jude CRTD placement in 2013  . LBBB (left bundle branch block) 2006  . Myocardial infarction (HCC) 1997  . Nephrolithiasis   . PAF (paroxysmal atrial fibrillation) (HCC) 2006   a. on amio and pradaxa. b. s/p AVN ablation  . Stroke [facility]) 12/2015 "several mini"   a. on coumadin --> switched to pradaxa b. s/p Watchman in 05/2016    Past Surgical History:  Procedure Laterality Date  . ABDOMINAL AORTIC ANEURYSM REPAIR N/A 01/03/2016   Procedure: ANEURYSM ABDOMINAL AORTA, BILATERAL COMMON ILIAC REPAIR;  Surgeon: [name], MD;  Location: [facility] OR;  Service: Vascular;  Laterality: N/A;  . ABDOMINAL AORTIC ANEURYSM REPAIR  01/03/2016  . APPENDECTOMY  09/11/11  . BACK SURGERY    . BI-VENTRICULAR IMPLANTABLE CARDIOVERTER DEFIBRILLATOR N/A  08/22/2012   SJM Quadra Assura BiV ICD implanted by Dr [name]  . CARDIAC CATHETERIZATION  1998; 2013  . CARDIOVERSION N/A 07/26/2016   Procedure: CARDIOVERSION;  Surgeon: Lars Masson, MD;  Location: [facility] ENDOSCOPY;  Service: Cardiovascular;  Laterality: N/A;  . CAROTID ENDARTERECTOMY Left July  2005  . CORONARY ARTERY BYPASS GRAFT  1998   CABG X5  . CYSTOSCOPY W/ STONE MANIPULATION  X 2  . ELECTROPHYSIOLOGIC STUDY N/A 08/17/2016   AVN ablation by Dr [name]  . ESOPHAGOGASTRODUODENOSCOPY N/A 02/28/2016   Procedure: ESOPHAGOGASTRODUODENOSCOPY (EGD);  Surgeon: [name], MD;  Location: [facility] ENDOSCOPY;  Service: Endoscopy;  Laterality: N/A;  . LEFT ATRIAL APPENDAGE OCCLUSION N/A 06/01/2016   Watchman device implanted by Dr [name]  . POSTERIOR FUSION LUMBAR SPINE  Sep 27, 2014   L4-L5  . PR VEIN BYPASS GRAFT,AORTO-FEM-POP  01/03/2016  . RIGHT HEART CATH N/A 05/28/2017   Procedure: Right Heart Cath;  Surgeon: Dolores Patty, MD;  Location: [facility] INVASIVE CV LAB;  Service: Cardiovascular;  Laterality: N/A;  . TEE WITHOUT CARDIOVERSION N/A 07/26/2016   Procedure: TRANSESOPHAGEAL ECHOCARDIOGRAM (TEE);  Surgeon: Lars Masson, MD;  Location: [facility] ENDOSCOPY;  Service: Cardiovascular;  Laterality: N/A;    Social History   Social History  . Marital status: Widowed    Spouse name: N/A  . Number of children: 3  . Years of education: 69 TH   Occupational History  . RETIRED---truck driver Retired   Social History Main Topics  . Smoking status: Former Smoker    Packs/day: 0.50    Years: 48.00    Types: Cigarettes    Quit date: 05/24/2017  . Smokeless tobacco: Never Used     Comment: quit 05/25/17  . Alcohol use 0.0 oz/week     Comment: 06/01/2016 "haven't had a drink in years"  . Drug use: No  . Sexual activity: Yes   Other Topics Concern  . Not on file   Social History Narrative   Patient is married with 3 children.   Patient is right handed.   Patient has 10 th grade education.     Patient drinks tea occasionally.   06/25/17 son lives with him    Family History  Problem Relation Age of Onset  . Stroke Maternal Uncle   . Stroke Maternal Uncle   . Heart attack Neg Hx   . Hypertension Neg Hx     ROS: no fevers or chills, productive cough, hemoptysis, dysphasia, odynophagia, melena, hematochezia, dysuria, hematuria, rash, seizure activity, orthopnea, PND, pedal edema, claudication. Remaining systems are negative.  Physical Exam: Well-developed well-nourished in no acute distress.  Skin is warm and dry.  HEENT is normal.  Neck is supple.  Chest with diminished breath sounds throughout Cardiovascular exam is regular rate and rhythm.  Abdominal exam nontender or distended. No masses palpated. Extremities show no edema. neuro grossly intact   A/P  1 coronary artery disease-continue aspirin and statin.  2 atrial fibrillation-patient is status post AV node ablation and watchman device.  3 ischemic cardiomyopathy-continue entresto and beta blocker.  4 hyperlipidemia-continue statin.  5 chronic systolic congestive heart failure-continue present dose of Lasix. Check potassium and renal function.  6 prior ICD-followed by electrophysiology.  7 carotid artery disease/abdominal aortic aneurysm/iliac aneurysm-followed by vascular surgery.  8 mitral regurgitation-severe on previous echo. Repeat study to see if this has improved.   9 thoracic aortic aneurysm-most recent echocardiogram showed dilated aorta of 4.7 cm. Repeat echocardiogram. If significantly dilated mainly CTA.  10 tobacco abuse-patient counseled on discontinuing.  Olga Millers, MD

## 2017-09-05 ENCOUNTER — Encounter: Payer: Self-pay | Admitting: *Deleted

## 2017-09-06 ENCOUNTER — Encounter: Payer: Self-pay | Admitting: Cardiology

## 2017-09-06 ENCOUNTER — Encounter: Payer: Self-pay | Admitting: *Deleted

## 2017-09-06 DIAGNOSIS — Z006 Encounter for examination for normal comparison and control in clinical research program: Secondary | ICD-10-CM

## 2017-09-06 NOTE — Progress Notes (Signed)
RESEARCH ENCOUNTER  Patient ID: [name] [name]  DOB: 08/01/1941  Johna Roles Maheu presented to the [facility] for Day1/Randomization visit of the [facility].  No signs/symptoms of ACS since the last visit. IP dispensed and education provided on medication regimen, returning IP/packaging, and holding the morning dose on the morning of research appointments.  Subject verbalized understanding.  Vitals, ECG, questionnaires, and labs obtained.    Patient will follow up with Research Clinic in 2 weeks.

## 2017-09-07 ENCOUNTER — Encounter: Payer: Self-pay | Admitting: *Deleted

## 2017-09-07 DIAGNOSIS — Z006 Encounter for examination for normal comparison and control in clinical research program: Secondary | ICD-10-CM

## 2017-09-10 ENCOUNTER — Other Ambulatory Visit: Payer: Self-pay | Admitting: Physician Assistant

## 2017-09-10 NOTE — Telephone Encounter (Signed)
REFILL 

## 2017-09-10 NOTE — Telephone Encounter (Signed)
Refill Request.  

## 2017-09-11 NOTE — Progress Notes (Signed)
Research labs resulted - potassium WNL at 4.9 mmol/L.  PI Mclean made aware on 9/28.  Will scan results into patient chart.

## 2017-09-12 ENCOUNTER — Telehealth: Payer: Self-pay | Admitting: Emergency Medicine

## 2017-09-12 NOTE — Telephone Encounter (Signed)
Spoke with pt's daughter Eunice Blase (dpr on file), verified inhaled medications with her.  Nothing further needed.

## 2017-09-17 ENCOUNTER — Encounter: Payer: Self-pay | Admitting: Cardiology

## 2017-09-17 ENCOUNTER — Ambulatory Visit (INDEPENDENT_AMBULATORY_CARE_PROVIDER_SITE_OTHER): Payer: Medicare Other | Admitting: Cardiology

## 2017-09-17 ENCOUNTER — Telehealth: Payer: Self-pay | Admitting: Emergency Medicine

## 2017-09-17 VITALS — BP 110/64 | HR 58 | Ht 73.0 in | Wt 202.0 lb

## 2017-09-17 DIAGNOSIS — I5042 Chronic combined systolic (congestive) and diastolic (congestive) heart failure: Secondary | ICD-10-CM | POA: Diagnosis not present

## 2017-09-17 DIAGNOSIS — I255 Ischemic cardiomyopathy: Secondary | ICD-10-CM

## 2017-09-17 DIAGNOSIS — I2510 Atherosclerotic heart disease of native coronary artery without angina pectoris: Secondary | ICD-10-CM

## 2017-09-17 DIAGNOSIS — I482 Chronic atrial fibrillation: Secondary | ICD-10-CM | POA: Diagnosis not present

## 2017-09-17 DIAGNOSIS — Z79899 Other long term (current) drug therapy: Secondary | ICD-10-CM

## 2017-09-17 DIAGNOSIS — I4821 Permanent atrial fibrillation: Secondary | ICD-10-CM

## 2017-09-17 DIAGNOSIS — I340 Nonrheumatic mitral (valve) insufficiency: Secondary | ICD-10-CM | POA: Diagnosis not present

## 2017-09-17 LAB — BASIC METABOLIC PANEL
BUN / CREAT RATIO: 20 (ref 10–24)
BUN: 26 mg/dL (ref 8–27)
CALCIUM: 9.4 mg/dL (ref 8.6–10.2)
CO2: 24 mmol/L (ref 20–29)
CREATININE: 1.31 mg/dL — AB (ref 0.76–1.27)
Chloride: 103 mmol/L (ref 96–106)
GFR, EST AFRICAN AMERICAN: 62 mL/min/{1.73_m2} (ref >59)
GFR, EST NON AFRICAN AMERICAN: 53 mL/min/{1.73_m2} — AB (ref >59)
Glucose: 97 mg/dL (ref 65–99)
Potassium: 4.1 mmol/L (ref 3.5–5.2)
Sodium: 145 mmol/L — ABNORMAL HIGH (ref 134–144)

## 2017-09-17 MED ORDER — BUDESONIDE-FORMOTEROL FUMARATE 160-4.5 MCG/ACT IN AERO: 2.0000 | INHALATION_SPRAY | Freq: Two times a day (BID) | RESPIRATORY_TRACT | 3 refills | Status: DC

## 2017-09-17 MED ORDER — TIOTROPIUM BROMIDE MONOHYDRATE 18 MCG IN CAPS: 18.0000 ug | ORAL_CAPSULE | Freq: Every day | RESPIRATORY_TRACT | 3 refills | Status: DC

## 2017-09-17 NOTE — Patient Instructions (Signed)
Medication Instructions:  Your physician recommends that you continue on your current medications as directed. Please refer to the Current Medication list given to you today.  Labwork: BMET TODAY   Testing/Procedures: Your physician has requested that you have an echocardiogram. Echocardiography is a painless test that uses sound waves to create images of your heart. It provides your doctor with information about the size and shape of your heart and how well your heart's chambers and valves are working. This procedure takes approximately one hour. There are no restrictions for this procedure. [facility] AT [address]  Follow-Up: Your physician recommends that you schedule a follow-up appointment in: 3 MONTH OV  If you need a refill on your cardiac medications before your next appointment, please call your pharmacy.

## 2017-09-17 NOTE — Telephone Encounter (Signed)
Forms faxed and confirmation received.

## 2017-09-17 NOTE — Telephone Encounter (Signed)
Route to [place] for [employer] .

## 2017-09-17 NOTE — Telephone Encounter (Addendum)
I have pt forms and pt signed the forms. I also have income information and everything needed to fax the prescription.

## 2017-09-17 NOTE — Telephone Encounter (Signed)
Forms were signed by RB and returned to [place].

## 2017-09-18 ENCOUNTER — Telehealth: Payer: Self-pay | Admitting: Emergency Medicine

## 2017-09-18 NOTE — Telephone Encounter (Signed)
Spoke with Bridgett with [facility] [employer], who request for patient assistant forms and Rx to be sent to [phone], as she is currently trying to get pt approved for patient assistants. Forms have been signed. Received fax confirmation. Nothing further needed.

## 2017-09-19 ENCOUNTER — Telehealth: Payer: Self-pay | Admitting: Emergency Medicine

## 2017-09-19 NOTE — Telephone Encounter (Signed)
Clarisse Gouge with pt assistance program returned call. She states the pt is needing pt assistance for Ventolin. We have not received the form from [place]. She is refaxing this to the front fax. The pt assistance form will need a printed rx attached. Will forward to [place] to follow up on.

## 2017-09-19 NOTE — Telephone Encounter (Signed)
Left message for Clarisse Gouge to call back. The Ventolin was not mentioned in the list of medications he needed. I want to touch basis with Clarisse Gouge to see if this is what she needs. I will be happy to do the application for this medication as well. I will await her call back.

## 2017-09-20 ENCOUNTER — Encounter: Payer: Self-pay | Admitting: *Deleted

## 2017-09-20 ENCOUNTER — Other Ambulatory Visit: Payer: Self-pay | Admitting: *Deleted

## 2017-09-20 DIAGNOSIS — Z006 Encounter for examination for normal comparison and control in clinical research program: Secondary | ICD-10-CM

## 2017-09-20 MED ORDER — STUDY - INVESTIGATIONAL MEDICATION: 1.0000 | Freq: Two times a day (BID) | 99 refills | Status: AC

## 2017-09-20 NOTE — Telephone Encounter (Signed)
Barton Dubois, are you able to verify that you received this form?  Thank you!

## 2017-09-20 NOTE — Telephone Encounter (Signed)
The form has been received but the pt will need to sign the form. Bridgette indicated that there was a spot on the form that RB would need to sign, this part only has to signed if the medication is a vaccine. Ventolin is not a vaccine. lmtcb x1 for pt.

## 2017-09-20 NOTE — Progress Notes (Signed)
RESEARCH ENCOUNTER  Patient ID: [name] [name]  DOB: February 23, 1941  [name] presented to the [facility] for the Week 2 visit of the [facility].  No signs/symptoms of ACS since the last visit. Subject compliant with IP.  Subject due for lab tests including PK level; however, subject took morning dose of IP.  Subjected stated that he forgot and is willing to come back to the clinic tomorrow for lab draw.  Educated on keeping and returning all unused IP and boxes as well as holding morning dose of IP on days with Research appt.  Subject verbalized understanding.    Blood pressure 107/60, pulse 80, weight 199 lb (90.3 kg).

## 2017-09-21 ENCOUNTER — Encounter: Payer: Self-pay | Admitting: *Deleted

## 2017-09-21 DIAGNOSIS — Z006 Encounter for examination for normal comparison and control in clinical research program: Secondary | ICD-10-CM

## 2017-09-21 NOTE — Progress Notes (Signed)
RESEARCH ENCOUNTER  Patient ID: [name] [name]  DOB: Jun 14, 1941  [name] [name] presented to the [facility] for Week 2 visit of the [facility].  Week 2 Study labs collected for PK value, etc.    There were no vitals taken for this visit.  Patient will follow up with Research Clinic in 2 weeks.

## 2017-09-21 NOTE — Telephone Encounter (Signed)
lmtcb for pt.  

## 2017-09-24 NOTE — Telephone Encounter (Signed)
lmtcb X2 for pt.  

## 2017-09-25 ENCOUNTER — Other Ambulatory Visit: Payer: Self-pay

## 2017-09-25 ENCOUNTER — Ambulatory Visit (HOSPITAL_COMMUNITY): Payer: Medicare Other | Attending: Cardiovascular Disease

## 2017-09-25 DIAGNOSIS — I340 Nonrheumatic mitral (valve) insufficiency: Secondary | ICD-10-CM | POA: Diagnosis not present

## 2017-09-25 DIAGNOSIS — I420 Dilated cardiomyopathy: Secondary | ICD-10-CM | POA: Insufficient documentation

## 2017-09-25 DIAGNOSIS — I5030 Unspecified diastolic (congestive) heart failure: Secondary | ICD-10-CM | POA: Insufficient documentation

## 2017-09-25 DIAGNOSIS — I051 Rheumatic mitral insufficiency: Secondary | ICD-10-CM | POA: Diagnosis not present

## 2017-09-25 MED ORDER — ALBUTEROL SULFATE HFA 108 (90 BASE) MCG/ACT IN AERS: 2.0000 | INHALATION_SPRAY | Freq: Four times a day (QID) | RESPIRATORY_TRACT | 3 refills | Status: AC | PRN

## 2017-09-25 NOTE — Telephone Encounter (Signed)
Form has been signed by pt. Rx has been printed and signed by RB. Form and rx has been faxed back. Nothing further was needed.

## 2017-09-25 NOTE — Telephone Encounter (Signed)
Called and spoke with pt today and he stated that he will come up here and sign the forms that [facility] has.  He stated that he will be here after 11.

## 2017-09-26 ENCOUNTER — Encounter: Payer: Self-pay | Admitting: *Deleted

## 2017-09-26 ENCOUNTER — Telehealth: Payer: Self-pay | Admitting: *Deleted

## 2017-09-26 NOTE — Telephone Encounter (Signed)
Enrollment application for the [employer] patient assistance foundation for eliquis faxed

## 2017-09-26 NOTE — Telephone Encounter (Signed)
This encounter was created in error - please disregard.

## 2017-10-01 ENCOUNTER — Ambulatory Visit (INDEPENDENT_AMBULATORY_CARE_PROVIDER_SITE_OTHER): Payer: Medicare Other | Admitting: Emergency Medicine

## 2017-10-01 ENCOUNTER — Encounter: Payer: Self-pay | Admitting: Emergency Medicine

## 2017-10-01 ENCOUNTER — Encounter: Payer: Self-pay | Admitting: *Deleted

## 2017-10-01 DIAGNOSIS — J449 Chronic obstructive pulmonary disease, unspecified: Secondary | ICD-10-CM

## 2017-10-01 DIAGNOSIS — I255 Ischemic cardiomyopathy: Secondary | ICD-10-CM | POA: Diagnosis not present

## 2017-10-01 DIAGNOSIS — Z006 Encounter for examination for normal comparison and control in clinical research program: Secondary | ICD-10-CM

## 2017-10-01 DIAGNOSIS — Z23 Encounter for immunization: Secondary | ICD-10-CM

## 2017-10-01 DIAGNOSIS — F17200 Nicotine dependence, unspecified, uncomplicated: Secondary | ICD-10-CM

## 2017-10-01 LAB — PULMONARY FUNCTION TEST
DL/VA % pred: 50 %
DL/VA: 2.38 ml/min/mmHg/L
DLCO cor % pred: 49 %
DLCO cor: 17.43 ml/min/mmHg
DLCO unc % pred: 49 %
DLCO unc: 17.48 ml/min/mmHg
FEF 25-75 Post: 1.75 L/sec
FEF 25-75 Pre: 1.81 L/sec
FEF2575-%Change-Post: -3 %
FEF2575-%Pred-Post: 72 %
FEF2575-%Pred-Pre: 75 %
FEV1-%Change-Post: 0 %
FEV1-%Pred-Post: 99 %
FEV1-%Pred-Pre: 100 %
FEV1-Post: 3.3 L
FEV1-Pre: 3.32 L
FEV1FVC-%Change-Post: 1 %
FEV1FVC-%Pred-Pre: 86 %
FEV6-%Change-Post: -1 %
FEV6-%Pred-Post: 117 %
FEV6-%Pred-Pre: 119 %
FEV6-Post: 5.07 L
FEV6-Pre: 5.13 L
FEV6FVC-%Change-Post: 0 %
FEV6FVC-%Pred-Post: 104 %
FEV6FVC-%Pred-Pre: 103 %
FVC-%Change-Post: -1 %
FVC-%Pred-Post: 113 %
FVC-%Pred-Pre: 115 %
FVC-Post: 5.19 L
FVC-Pre: 5.29 L
Post FEV1/FVC ratio: 64 %
Post FEV6/FVC ratio: 98 %
Pre FEV1/FVC ratio: 63 %
Pre FEV6/FVC Ratio: 97 %
RV % pred: 59 %
RV: 1.58 L
TLC % pred: 94 %
TLC: 7.04 L

## 2017-10-01 NOTE — Assessment & Plan Note (Signed)
He is still smoking a small amount per week. We discussed cessation.

## 2017-10-01 NOTE — Progress Notes (Signed)
  Subjective:    Patient ID: [name] [name], male    DOB: Nov 07, 1941, 76 y.o.   MRN: [id number] HPI   ROV 10/01/17 -- This follow-up visit for patient with a history of tobacco use, coronary disease, hypertension, atrial fibrillation, MR with decreased LV fxn. I have followed him for mild obstruction and COPD. He had never truly benefited significantly from scheduled bronchodilators on our past evaluations. He was admitted with an apparent acute exacerbation of COPD + his cardiac disease June 2018, was discharged on Spiriva and Symbicort. He underwent pulmonary function testing today and I have reviewed. This shows mild obstruction, FEV1 3.32 L (100% predicted). Some evidence for restriction based on decreased residual volume, decreased diffusion capacity that does not correct when adjusted for alveolar volume. He is smoking about a pack a week.      Objective:   Physical Exam Vitals:   10/01/17 1053 10/01/17 1055  BP:  138/70  Pulse:  83  SpO2:  99%  Weight: 199 lb (90.3 kg)   Height: 6' (1.829 m)    Gen: Pleasant, obese, in no distress,  normal affect  ENT: No lesions,  Some while exudate on back of tongue and post pharynx  Neck: No JVD, no TMG, no carotid bruits  Lungs: No use of accessory muscles, no wheeze  Cardiovascular: RRR, heart sounds normal, no murmur or gallops, no peripheral edema  Musculoskeletal: No deformities, no cyanosis or clubbing  Neuro: alert, non focal  Skin: Warm, no lesions or rashes     Assessment & Plan:  Tobacco use disorder He is still smoking a small amount per week. We discussed cessation.  COPD (chronic obstructive pulmonary disease) (HCC) Mild obstruction on pulmonary function testing from today. Currently on Spiriva, Symbicort. I will stop the Symbicort and see if he tolerates. If he misses it then I would like to change him to an alternative LABA/LAMA such as Anoro or Stiolto. He will call us to let us know  We will try stopping  Symbicort. Please continue Spiriva 1 inhalation once a day Please call our office in one month to let us know if your breathing has changed, if you miss the Symbicort. If you do miss the Symbicort then we will change to an alternative inhaler Take albuterol 2 puffs up to every 4 hours if needed for shortness of breath.  Flu shot today You need to keep working on stopping smoking completely.  Follow with Dr Delton Coombes in 6 months or sooner if you have any problems   Levy Pupa, MD, PhD 10/01/2017, 11:11 AM [facility] [phone] or if no answer [phone]

## 2017-10-01 NOTE — Patient Instructions (Signed)
PFT done today. 

## 2017-10-01 NOTE — Assessment & Plan Note (Signed)
Mild obstruction on pulmonary function testing from today. Currently on Spiriva, Symbicort. I will stop the Symbicort and see if he tolerates. If he misses it then I would like to change him to an alternative LABA/LAMA such as Anoro or Stiolto. He will call us to let us know  We will try stopping Symbicort. Please continue Spiriva 1 inhalation once a day Please call our office in one month to let us know if your breathing has changed, if you miss the Symbicort. If you do miss the Symbicort then we will change to an alternative inhaler Take albuterol 2 puffs up to every 4 hours if needed for shortness of breath.  Flu shot today You need to keep working on stopping smoking completely.  Follow with Dr Delton Coombes in 6 months or sooner if you have any problems

## 2017-10-01 NOTE — Patient Instructions (Signed)
We will try stopping Symbicort. Please continue Spiriva 1 inhalation once a day Please call our office in one month to let us know if your breathing has changed, if you miss the Symbicort. If you do miss the Symbicort then we will change to an alternative inhaler Take albuterol 2 puffs up to every 4 hours if needed for shortness of breath.  Flu shot today You need to keep working on stopping smoking completely.  Follow with Dr Delton Coombes in 6 months or sooner if you have any problems

## 2017-10-01 NOTE — Progress Notes (Addendum)
RESEARCH ENCOUNTER  Patient ID: [name] [name]  DOB: 03/07/1941  [name] [name] presented to the [facility] for the Week 4 visit of the [facility].  No signs/symptoms of ACS since the last visit. Subject states compliance with IP and additional IP dispensed.  Unfortunately, [name] forgot to return the IP dispensed on Week 1.  Re-educated to bring back all IP/boxes to every research visit.  [name] will start taking IP dispensed today and will return unused on Nov 6th/Week 6 visit.  Subject verbalized understanding.  Note with IP instructions given to subject and written on outside of the bag used to store IP.     Patient will follow up with Research Clinic in 2 weeks.

## 2017-10-08 ENCOUNTER — Ambulatory Visit (INDEPENDENT_AMBULATORY_CARE_PROVIDER_SITE_OTHER): Payer: Medicare Other | Admitting: *Deleted

## 2017-10-08 DIAGNOSIS — I5042 Chronic combined systolic (congestive) and diastolic (congestive) heart failure: Secondary | ICD-10-CM | POA: Diagnosis not present

## 2017-10-08 DIAGNOSIS — Z95810 Presence of automatic (implantable) cardiac defibrillator: Secondary | ICD-10-CM

## 2017-10-09 NOTE — Progress Notes (Signed)
EPIC Encounter for ICM Monitoring  Patient Name: [name] [name] is a 76 y.o. male Date: 10/09/2017 Primary Care Physican: Raina Mina., MD Primary Cardiologist:Crenshaw Electrophysiologist: [name] Dry Weight:unknown Bi-V Pacing: 96% AT/AF Burden >99%       Heart Failure questions reviewed, pt asymptomatic.   Thoracic impedance is normal but was abnormal suggesting fluid accumulation from 08/30/2017 to 09/11/2017.  Prescribed dosage: Furosemide 20 mg 2tablets (40 mg total) daily. Potassium 20 mEq 1 tablets a day.   Labs: 09/17/2017 Creatinine 1.31, BUN 26, Potassium 4.1, Sodium 145, EGFR 53-62 06/26/2017 Creatinine 1.13, BUN 27, Potassium 5.1, Sodium 143, EGFR 64-74  06/19/2017 Creatinine 1.31, BUN 33, Potassium 5.1, Sodium 145, EGFR 53-62  06/12/2017 Creatinine 1.44, BUN 41, Potassium 5.5, Sodium 143, EGFR 47-55  05/29/2017 Creatinine 1.59, BUN 48, Potassium 3.1, Sodium 143, EGFR 41-48  05/28/2017 Creatinine 1.49, BUN 41, Potassium 3.3, Sodium 144, EGFR 48-55  05/25/2017 Creatinine 1.41, BUN 20, Potassium 2.8, Sodium 142, EGFR 48-55  02/01/2017 Creatinine 1.44, BUN 23, Potassium 3.2. Sodium 146 08/15/2016 Creatinine 1.23, BUN 21, Potassium 3.6, Sodium 146  08/09/2016 Creatinine 1.36, BUN 21, Potassium 2.7, Sodium 143  07/10/2016 Creatinine 1.72, BUN 33, Potassium 5.1, Sodium 143  06/02/2016 Creatinine 1.44, BUN 20, Potassium 3.0, Sodium 144   Recommendations: No changes.  Encouraged to call for fluid symptoms.  Follow-up plan: ICM clinic phone appointment on 11/08/2017.    Copy of ICM check sent to Dr. Rayann Heman.   3 month ICM trend: 10/08/2017   1 Year ICM trend:      Rosalene Billings, RN 10/09/2017 10:58 AM

## 2017-10-09 NOTE — Progress Notes (Signed)
Remote ICD transmission.   

## 2017-10-12 LAB — CUP PACEART REMOTE DEVICE CHECK
Battery Remaining Percentage: 30 %
Date Time Interrogation Session: 20181029080020
HIGH POWER IMPEDANCE MEASURED VALUE: 80 Ohm
HighPow Impedance: 80 Ohm
Implantable Lead Implant Date: 20130912
Implantable Lead Location: 753858
Implantable Lead Location: 753860
Implantable Pulse Generator Implant Date: 20130912
Lead Channel Impedance Value: 510 Ohm
Lead Channel Pacing Threshold Amplitude: 0.625 V
Lead Channel Pacing Threshold Amplitude: 2.5 V
Lead Channel Pacing Threshold Pulse Width: 0.5 ms
Lead Channel Pacing Threshold Pulse Width: 0.8 ms
Lead Channel Setting Pacing Amplitude: 2 V
Lead Channel Setting Pacing Pulse Width: 0.5 ms
Lead Channel Setting Sensing Sensitivity: 0.5 mV
MDC IDC LEAD IMPLANT DT: 20130912
MDC IDC LEAD IMPLANT DT: 20130912
MDC IDC LEAD LOCATION: 753859
MDC IDC MSMT BATTERY REMAINING LONGEVITY: 23 mo
MDC IDC MSMT BATTERY VOLTAGE: 2.89 V
MDC IDC MSMT LEADCHNL LV IMPEDANCE VALUE: 1275 Ohm
MDC IDC MSMT LEADCHNL RA IMPEDANCE VALUE: 490 Ohm
MDC IDC MSMT LEADCHNL RA SENSING INTR AMPL: 1.8 mV
MDC IDC MSMT LEADCHNL RV SENSING INTR AMPL: 11.7 mV
MDC IDC SET LEADCHNL LV PACING AMPLITUDE: 3.5 V
MDC IDC SET LEADCHNL LV PACING PULSEWIDTH: 0.8 ms
Pulse Gen Serial Number: 1075181

## 2017-10-16 ENCOUNTER — Encounter: Payer: Self-pay | Admitting: *Deleted

## 2017-10-16 ENCOUNTER — Encounter: Payer: Self-pay | Admitting: Cardiology

## 2017-10-16 DIAGNOSIS — Z006 Encounter for examination for normal comparison and control in clinical research program: Secondary | ICD-10-CM

## 2017-10-16 NOTE — Progress Notes (Signed)
RESEARCH ENCOUNTER  Patient ID: [name] [name]  DOB: [birth year]  [name] [name] presented to the [facility] for the Week 6 visit of the [facility].  No signs/symptoms of ACS since the last visit. Subject states compliant with IP.  Subject due for lab tests including PK level; however, subject took morning dose of IP.  Subjected stated that he forgot and is willing to come back to the clinic tomorrow for lab draw.  Re-educated on keeping and returning all unused IP and boxes as well as holding morning dose of IP on days with Research appt.  Subject verbalized understanding.     Patient will follow up with [facility] tomorrow.

## 2017-10-19 ENCOUNTER — Other Ambulatory Visit: Payer: Self-pay | Admitting: Physician Assistant

## 2017-10-19 DIAGNOSIS — I5023 Acute on chronic systolic (congestive) heart failure: Secondary | ICD-10-CM

## 2017-10-19 NOTE — Telephone Encounter (Signed)
Pharmacy requests a ninety day supply. 

## 2017-10-19 NOTE — Telephone Encounter (Signed)
Refill Request.  

## 2017-10-25 ENCOUNTER — Telehealth: Payer: Self-pay | Admitting: Physician Assistant

## 2017-10-25 NOTE — Telephone Encounter (Signed)
Left message for [name] [name], dr [name] will sign paperwork for the patient assistance for entresto. Ask her to fax paperwork to [place] or call if needed.

## 2017-10-25 NOTE — Telephone Encounter (Signed)
Jade calling with [facility], would like to know if Azalee Course would provide his signature on the patience assistance paperwork for the [facility] medication. Lesly Rubenstein said she will handle all the paperwork, she just needs a signature.

## 2017-10-29 ENCOUNTER — Encounter: Payer: Medicare Other | Admitting: *Deleted

## 2017-10-29 VITALS — BP 119/81 | HR 77 | Wt 199.8 lb

## 2017-10-29 DIAGNOSIS — Z006 Encounter for examination for normal comparison and control in clinical research program: Secondary | ICD-10-CM

## 2017-10-29 NOTE — Progress Notes (Signed)
RESEARCH ENCOUNTER  Patient ID: [name] [name]  DOB: February 07, 1941  Johna Roles Streat presented to the [facility] for the Week 8 visit of the [facility].  No signs/symptoms of ACS since the last visit. Subject 100% compliant with IP, IP returned, and additional IP dispensed.    Patient will follow up with [facility] in 4 weeks.

## 2017-11-08 ENCOUNTER — Ambulatory Visit (INDEPENDENT_AMBULATORY_CARE_PROVIDER_SITE_OTHER): Payer: Medicare Other

## 2017-11-08 ENCOUNTER — Telehealth: Payer: Self-pay | Admitting: *Deleted

## 2017-11-08 DIAGNOSIS — Z95810 Presence of automatic (implantable) cardiac defibrillator: Secondary | ICD-10-CM | POA: Diagnosis not present

## 2017-11-08 DIAGNOSIS — I5042 Chronic combined systolic (congestive) and diastolic (congestive) heart failure: Secondary | ICD-10-CM

## 2017-11-08 NOTE — Telephone Encounter (Signed)
   [facility] Pre-operative Risk Assessment    Request for surgical clearance:  1. What type of surgery is being performed? Cataract Extraction by PE, IOL-Right   2. When is this surgery scheduled? 11/15/17   3. Are there any medications that need to be held prior to surgery and how long?   4. Practice name and name of physician performing surgery? [facility], Dr. Marca Ancona. [place]   5. What is your office phone and fax number? Fax [phone]. Call Nobie Putnam with questions at [phone]   6. Anesthesia type (None, local, MAC, general) ? IV sedation  (Appointment with Dr. Stanford Breed on 12/24/17, last seen on 10/8) Ricci Barker 11/08/2017, 12:36 PM  _________________________________________________________________   (provider comments below)

## 2017-11-09 NOTE — Progress Notes (Signed)
EPIC Encounter for ICM Monitoring  Patient Name: [name] [name] is a 76 y.o. male Date: 11/09/2017 Primary Care Physican: Grisso, Greg A., MD Primary Cardiologist:Crenshaw Electrophysiologist: [name] Dry Weight:unknown Bi-V Pacing: 96% AT/AF Burden >99%                                               Heart Failure questions reviewed, pt asymptomatic.   Thoracic impedance is normal  Prescribed dosage: Furosemide 20 mg 2tablets (40 mg total) daily. Potassium 20 mEq 1 tablets a day.   Labs: 09/17/2017 Creatinine 1.31, BUN 26, Potassium 4.1, Sodium 145, EGFR 53-62 06/26/2017 Creatinine 1.13, BUN 27, Potassium 5.1, Sodium 143, EGFR 64-74  06/19/2017 Creatinine 1.31, BUN 33, Potassium 5.1, Sodium 145, EGFR 53-62  06/12/2017 Creatinine 1.44, BUN 41, Potassium 5.5, Sodium 143, EGFR 47-55  05/29/2017 Creatinine 1.59, BUN 48, Potassium 3.1, Sodium 143, EGFR 41-48  05/28/2017 Creatinine 1.49, BUN 41, Potassium 3.3, Sodium 144, EGFR 48-55  05/25/2017 Creatinine 1.41, BUN 20, Potassium 2.8, Sodium 142, EGFR 48-55  02/01/2017 Creatinine 1.44, BUN 23, Potassium 3.2. Sodium 146 08/15/2016 Creatinine 1.23, BUN 21, Potassium 3.6, Sodium 146  08/09/2016 Creatinine 1.36, BUN 21, Potassium 2.7, Sodium 143  07/10/2016 Creatinine 1.72, BUN 33, Potassium 5.1, Sodium 143  06/02/2016 Creatinine 1.44, BUN 20, Potassium 3.0, Sodium 144   Recommendations: No changes.  Encouraged to call for fluid symptoms  Follow-up plan: ICM clinic phone appointment on 12/13/2017.   Copy of ICM check sent to Dr. [name].   3 month ICM trend: 11/08/2017    1 Year ICM trend:       Laurie S Short, RN 11/09/2017 2:00 PM    

## 2017-11-09 NOTE — Telephone Encounter (Signed)
   Chart reviewed as part of pre-operative protocol coverage. Given past medical history and time since last visit, based on ACC/AHA guidelines, [name] would be at acceptable risk for the planned procedure without further cardiovascular testing. This was reviewed with Dr. [name].  I will route this recommendation to the requesting party via Epic fax function and remove from pre-op pool.  Please call with questions.  Laurann Montana, PA-C 11/09/2017, 4:45 PM

## 2017-11-09 NOTE — Telephone Encounter (Signed)
Ok for cataract surgery [name] [name]  

## 2017-11-09 NOTE — Telephone Encounter (Signed)
   Chart reviewed as part of pre-operative protocol coverage. Given that cataract is low risk procedure, usually per ACC/AHA guidelines, [name] [name] would be at acceptable risk for the planned procedure without further cardiovascular testing. However, given complex PMH, will route to Dr. [name] in case he has any specific recommendation otherwise. Dr. [name], please route reply to P CV DIV PREOP.  Laurann Montana, PA-C 11/09/2017, 1:35 PM

## 2017-11-12 ENCOUNTER — Telehealth: Payer: Self-pay | Admitting: Cardiology

## 2017-11-12 NOTE — Telephone Encounter (Signed)
New Message  Pt call requesting to speak with RN to f/u on his surgical clearance. Please call back to discuss

## 2017-11-13 NOTE — Telephone Encounter (Signed)
Per surgical 11/29 clearance request note, "Crenshaw, Madolyn Frieze, MD  to Cv Div Preop   1:38 PM  Note    Ok for cataract surgery [name] [name]      Informed patient clearance has been sent. He was grateful for call.

## 2017-11-15 ENCOUNTER — Telehealth: Payer: Self-pay | Admitting: *Deleted

## 2017-11-15 ENCOUNTER — Telehealth: Payer: Self-pay | Admitting: Cardiology

## 2017-11-15 NOTE — Telephone Encounter (Signed)
New Message    Please refax surgical clearance, they did not receive it , they are holding patient surgery until they get a written copy.  [phone] fax

## 2017-11-15 NOTE — Telephone Encounter (Signed)
[facility] called requesting surgical clearance. They did not receive the clearance that was faxed via epic. Clearance has been faxed to [phone] attn:Sheila.

## 2017-11-21 ENCOUNTER — Other Ambulatory Visit: Payer: Self-pay | Admitting: *Deleted

## 2017-11-21 MED ORDER — PRAVASTATIN SODIUM 80 MG PO TABS: 80.0000 mg | ORAL_TABLET | Freq: Every day | ORAL | 1 refills | Status: DC

## 2017-11-28 ENCOUNTER — Encounter: Payer: Medicare Other | Admitting: *Deleted

## 2017-11-28 VITALS — BP 116/63 | HR 73 | Wt 202.4 lb

## 2017-11-28 DIAGNOSIS — Z006 Encounter for examination for normal comparison and control in clinical research program: Secondary | ICD-10-CM

## 2017-11-28 NOTE — Progress Notes (Signed)
RESEARCH ENCOUNTER  Patient ID: [name] [name]  DOB: November 01, 1941  Johna Roles Waner presented to the [facility] for the Week 12 visit of the [facility].  No signs/symptoms of ACS since the last visit. Subject compliant with IP, IP returned, and additional IP dispensed.  In addition, week 12 surveys and labs completed.    Patient will follow up with [facility] in 12 weeks.

## 2017-12-05 DIAGNOSIS — N189 Chronic kidney disease, unspecified: Secondary | ICD-10-CM | POA: Insufficient documentation

## 2017-12-05 DIAGNOSIS — I82409 Acute embolism and thrombosis of unspecified deep veins of unspecified lower extremity: Secondary | ICD-10-CM | POA: Insufficient documentation

## 2017-12-05 DIAGNOSIS — Z8711 Personal history of peptic ulcer disease: Secondary | ICD-10-CM | POA: Insufficient documentation

## 2017-12-05 DIAGNOSIS — I639 Cerebral infarction, unspecified: Secondary | ICD-10-CM | POA: Insufficient documentation

## 2017-12-05 DIAGNOSIS — I723 Aneurysm of iliac artery: Secondary | ICD-10-CM | POA: Insufficient documentation

## 2017-12-05 DIAGNOSIS — I7100 Dissection of unspecified site of aorta: Secondary | ICD-10-CM | POA: Insufficient documentation

## 2017-12-05 DIAGNOSIS — I255 Ischemic cardiomyopathy: Secondary | ICD-10-CM | POA: Insufficient documentation

## 2017-12-05 DIAGNOSIS — E785 Hyperlipidemia, unspecified: Secondary | ICD-10-CM | POA: Insufficient documentation

## 2017-12-05 DIAGNOSIS — I10 Essential (primary) hypertension: Secondary | ICD-10-CM | POA: Insufficient documentation

## 2017-12-05 DIAGNOSIS — I2510 Atherosclerotic heart disease of native coronary artery without angina pectoris: Secondary | ICD-10-CM | POA: Insufficient documentation

## 2017-12-05 DIAGNOSIS — I509 Heart failure, unspecified: Secondary | ICD-10-CM | POA: Insufficient documentation

## 2017-12-05 DIAGNOSIS — N200 Calculus of kidney: Secondary | ICD-10-CM | POA: Insufficient documentation

## 2017-12-05 DIAGNOSIS — Z8719 Personal history of other diseases of the digestive system: Secondary | ICD-10-CM | POA: Insufficient documentation

## 2017-12-12 ENCOUNTER — Other Ambulatory Visit: Payer: Self-pay | Admitting: Physician Assistant

## 2017-12-12 NOTE — Telephone Encounter (Signed)
Please review for refill. Thanks!  

## 2017-12-13 ENCOUNTER — Other Ambulatory Visit: Payer: Self-pay

## 2017-12-13 ENCOUNTER — Ambulatory Visit (INDEPENDENT_AMBULATORY_CARE_PROVIDER_SITE_OTHER): Payer: Medicare Other

## 2017-12-13 DIAGNOSIS — I5042 Chronic combined systolic (congestive) and diastolic (congestive) heart failure: Secondary | ICD-10-CM

## 2017-12-13 DIAGNOSIS — Z95810 Presence of automatic (implantable) cardiac defibrillator: Secondary | ICD-10-CM

## 2017-12-13 MED ORDER — SACUBITRIL-VALSARTAN 49-51 MG PO TABS: 1.0000 | ORAL_TABLET | Freq: Two times a day (BID) | ORAL | 1 refills | Status: AC

## 2017-12-14 NOTE — Progress Notes (Signed)
EPIC Encounter for ICM Monitoring  Patient Name: [name] [name] is a 77 y.o. male Date: 12/14/2017 Primary Care Physican: Raina Mina., MD Primary Cardiologist:Crenshaw Electrophysiologist: [name] Dry Weight:unknown Bi-V Pacing: 97% AT/AF Burden >99%      Heart Failure questions reviewed, pt asymptomatic.   Thoracic impedance normal.  Prescribed dosage: Furosemide 20 mg 2tablets (40 mg total) daily. Potassium 20 mEq 1tablets aday.   Labs: 09/17/2017 Creatinine1.31[name], Potassium4.1, [id number], [id number] 06/26/2017 Creatinine1.13, BUN27, Potassium5.1, [id number], [id number]  06/19/2017 Creatinine1.31, BUN33, Potassium5.1, [id number], [id number]  06/12/2017 Creatinine1.44, BUN41, Potassium5.5, [id number], [id number]  05/29/2017 Creatinine1.59, BUN48, Potassium3.1, SELTRV202, [id number]  05/28/2017 Creatinine1.49, BUN41, Potassium3.3, [id number], [id number]  05/25/2017 Creatinine1.41, BUN20, Potassium2.8, [id number], [id number] 02/01/2017 Creatinine 1.44, BUN 23, Potassium 3.2. Sodium 146 08/15/2016 Creatinine 1.23, BUN 21, Potassium 3.6, Sodium 146  08/09/2016 Creatinine 1.36, BUN 21, Potassium 2.7, Sodium 143  07/10/2016 Creatinine 1.72, BUN 33, Potassium 5.1, Sodium 143  06/02/2016 Creatinine 1.44, BUN 20, Potassium 3.0, Sodium 144  Recommendations: No changes.   Encouraged to call for fluid symptoms.  Follow-up plan: ICM clinic phone appointment on 01/14/2018.  Office appointment scheduled 12/24/2017 with Dr. Stanford Breed.  Copy of ICM check sent to Dr. Rayann Heman.   3 month ICM trend: 12/13/2017    1 Year ICM trend:       Rosalene Billings, RN 12/14/2017 10:20 AM

## 2017-12-18 NOTE — Progress Notes (Deleted)
HPI: Fu CAD s/p CABG, ischemic CM, combined systolic and diastolic CHF, AF, COPD, s/p CRT-D, s/p AV node ablation.He also has a history of focal aortic dissection being managed medically. He also has a hx of CEA. He underwent aortic and iliac artery aneurysm repair by Dr. [name] in 1/17 with a aorta to bilateral CIA bypass. Watchman device implant (placed 6/17). Admitted 6/18 with CHF and improved with diuresis; noted to have severe MR and plan was fu echo and if MR remains severe when CHF compensated, consider mitraclip. Meds have been titrated as outpt.  Since last seen,   Echo 6/18 showed EF 20-25, grade 2 DD, moderate MR, biatrial enlargement.   Myoview 12/16 EF 25%, inf scar, no ischemia, Intermediate Risk due to reduced EF  Carotid US 11/16 R < 40%; L CEA patent with < 40%  TEE 8/17 EF 25-30; mild to moderate MR; watchman device in place with residual lobe not covered by device   LHC 8/13 Left mainstem: LM normal.  Left anterior descending (LAD): 100 proximal. LAD fills via LIMA. No high grade distal disease. D1 moderated sized and fills via SVG. Left circumflex (LCx): AV groove diffuse luminal irregularities. Mid 25%. Small OM2 with ostial 95%. MOM with subtotal stenosis. There is filling of two moderate sized OMs with SVG. Both of these vessels are occluded proximally but demonstrated no high grade disease after the graft anastomosis.  Right coronary artery (RCA): Occluded proximally. PDA is moderate sized and fills via SVG. Two moderate sized PLs backfill via this graft and are free if disease. Grafts:  LIMA: Patent to the LAD. SVG to Diag: Patent with proximal circumferential 50% proximal stenosis and mild luminal irregularities. SVG to OM x 2: Patent with mild luminal irregularities. SVG to RCA: Mild luminal irregularities with mid 40% stenosis. Left ventriculography: Left ventricular systolic function is reduced, LVEF is estimated at  35%, there is no significant mitral regurgitation.   RHC 6/18 showed: RA = 5 RV = 44/4 PA = 41/18 (26) PCW = 12 (no significant v-waves) Fick cardiac output/index = 4.9/2.4 PVR = 1.5 WU FA sat = 89% PA sat = 56%  Current Outpatient Medications  Medication Sig Dispense Refill  . albuterol (PROVENTIL HFA;VENTOLIN HFA) 108 (90 Base) MCG/ACT inhaler Inhale 2 puffs into the lungs every 6 (six) hours as needed for wheezing or shortness of breath. 3 Inhaler 3  . albuterol (PROVENTIL) (2.5 MG/3ML) 0.083% nebulizer solution USE ONE VIAL IN NEBULIZER EVERY 4 HOURS AS NEEDED FOR WHEEZING AND FOR SHORTNESS OF BREATH 360 mL 11  . amLODipine (NORVASC) 10 MG tablet Take 0.5 tablets (5 mg total) by mouth daily. 30 tablet 6  . aspirin EC 325 MG EC tablet Take 1 tablet (325 mg total) by mouth daily.    . bisoprolol (ZEBETA) 5 MG tablet Take 0.5 tablets (2.5 mg total) by mouth daily. 30 tablet 6  . budesonide-formoterol (SYMBICORT) 160-4.5 MCG/ACT inhaler Inhale 2 puffs into the lungs 2 (two) times daily. 3 Inhaler 3  . citalopram (CELEXA) 10 MG tablet Take 10 mg by mouth daily. Reported on 06/06/2016    . furosemide (LASIX) 20 MG tablet TAKE 2 TABLETS BY MOUTH ONCE DAILY (DOSE  INCREASE) 180 tablet 0  . Investigational - Study Medication Take 1 tablet by mouth 2 (two) times daily. Study name: [employer] Heart Failure Research Study Additional study details: Omecamtiv Mecarbil or Placebo 1 each PRN  . Multiple Vitamin (MULITIVITAMIN WITH MINERALS) TABS Take 1 tablet  by mouth daily.    . niacin (NIASPAN) 500 MG CR tablet Take 1,000 mg by mouth 2 (two) times daily.     . nitroGLYCERIN (NITROSTAT) 0.4 MG SL tablet Place 0.4 mg under the tongue every 5 (five) minutes as needed for chest pain. X 3 doses    . potassium chloride SA (K-DUR,KLOR-CON) 20 MEQ tablet Take 1 tablet (20 mEq total) by mouth daily. 30 tablet 0  . pravastatin (PRAVACHOL) 80 MG tablet Take 1 tablet (80 mg total) by mouth daily. 90 tablet 1    . sacubitril-valsartan (ENTRESTO) 49-51 MG Take 1 tablet by mouth 2 (two) times daily. 60 tablet 1  . spironolactone (ALDACTONE) 25 MG tablet TAKE 1 TABLET BY MOUTH ONCE DAILY 90 tablet 3  . tiotropium (SPIRIVA HANDIHALER) 18 MCG inhalation capsule Place 1 capsule (18 mcg total) into inhaler and inhale daily. 90 capsule 3   No current facility-administered medications for this visit.      Past Medical History:  Diagnosis Date  . Aortic dissection (HCC)    H/O focal aortic dissection  . CAD (coronary artery disease)    a. s/p CABG in 1998 with LIMA-LAD, SVG-D1, SVG-OM2-OM3, and SVG-RCA b. cath 10/07:  LM ok, LAD occluded, AV CFX 95%, pRCA occluded; L-LAD, S-OM2/OM3, S-PDA ok c. 2013: cath showing patent grafts  . Cerebrovascular disease    a. s/p prior Left CEA;  followed by Dr. [name]  . CHF (congestive heart failure) (HCC)    a. s/p STJ CRTD  . CKD (chronic kidney disease)   . COPD (chronic obstructive pulmonary disease) (HCC)   . Depression   . DVT (deep venous thrombosis) (HCC)   . History of stomach ulcers   . Hyperlipidemia   . Hypertension   . Iliac artery aneurysm, left (HCC)    followed by Dr. [name]  . Ischemic cardiomyopathy    a. s/p St. Jude CRTD placement in 2013  . LBBB (left bundle branch block) 2006  . Myocardial infarction (HCC) 1997  . Nephrolithiasis   . PAF (paroxysmal atrial fibrillation) (HCC) 2006   a. on amio and pradaxa. b. s/p AVN ablation  . Stroke [facility]) 12/2015 "several mini"   a. on coumadin --> switched to pradaxa b. s/p Watchman in 05/2016    Past Surgical History:  Procedure Laterality Date  . ABDOMINAL AORTIC ANEURYSM REPAIR N/A 01/03/2016   Procedure: ANEURYSM ABDOMINAL AORTA, BILATERAL COMMON ILIAC REPAIR;  Surgeon: [name], MD;  Location[facility] OR;  Service: Vascular;  Laterality: N/A;  . ABDOMINAL AORTIC ANEURYSM REPAIR  01/03/2016  . APPENDECTOMY  09/11/11  . BACK SURGERY    . BI-VENTRICULAR IMPLANTABLE CARDIOVERTER DEFIBRILLATOR N/A  08/22/2012   SJM Quadra Assura BiV ICD implanted by Dr [name]  . CARDIAC CATHETERIZATION  1998; 2013  . CARDIOVERSION N/A 07/26/2016   Procedure: CARDIOVERSION;  Surgeon: Lars Masson, MD;  Location: [facility] ENDOSCOPY;  Service: Cardiovascular;  Laterality: N/A;  . CAROTID ENDARTERECTOMY Left July  2005  . CORONARY ARTERY BYPASS GRAFT  1998   CABG X5  . CYSTOSCOPY W/ STONE MANIPULATION  X 2  . ELECTROPHYSIOLOGIC STUDY N/A 08/17/2016   AVN ablation by Dr [name]  . ESOPHAGOGASTRODUODENOSCOPY N/A 02/28/2016   Procedure: ESOPHAGOGASTRODUODENOSCOPY (EGD);  Surgeon: [name], MD;  Location: [facility] ENDOSCOPY;  Service: Endoscopy;  Laterality: N/A;  . LEFT ATRIAL APPENDAGE OCCLUSION N/A 06/01/2016   Watchman device implanted by Dr [name]  . POSTERIOR FUSION LUMBAR SPINE  Sep 27, 2014  L4-L5  . PR VEIN BYPASS GRAFT,AORTO-FEM-POP  01/03/2016  . RIGHT HEART CATH N/A 05/28/2017   Procedure: Right Heart Cath;  Surgeon: Dolores Patty, MD;  Location: [facility] INVASIVE CV LAB;  Service: Cardiovascular;  Laterality: N/A;  . TEE WITHOUT CARDIOVERSION N/A 07/26/2016   Procedure: TRANSESOPHAGEAL ECHOCARDIOGRAM (TEE);  Surgeon: Lars Masson, MD;  Location: [facility] ENDOSCOPY;  Service: Cardiovascular;  Laterality: N/A;    Social History   Socioeconomic History  . Marital status: Widowed    Spouse name: Not on file  . Number of children: 3  . Years of education: 10 TH  . Highest education level: Not on file  Social Needs  . Financial resource strain: Not on file  . Food insecurity - worry: Not on file  . Food insecurity - inability: Not on file  . Transportation needs - medical: Not on file  . Transportation needs - non-medical: Not on file  Occupational History  . Occupation: RETIRED---truck Air traffic controller: RETIRED  Tobacco Use  . Smoking status: Former Smoker    Packs/day: 0.50    Years: 48.00    Pack years: 24.00    Types: Cigarettes    Last attempt to quit: 05/24/2017    Years since  quitting: 0.5  . Smokeless tobacco: Never Used  . Tobacco comment: quit 05/25/17  Substance and Sexual Activity  . Alcohol use: Yes    Alcohol/week: 0.0 oz    Comment: 06/01/2016 "haven't had a drink in years"  . Drug use: No  . Sexual activity: Yes  Other Topics Concern  . Not on file  Social History Narrative   Patient is married with 3 children.   Patient is right handed.   Patient has 10 th grade education.   Patient drinks tea occasionally.   06/25/17 son lives with him    Family History  Problem Relation Age of Onset  . Stroke Maternal Uncle   . Stroke Maternal Uncle   . Heart attack Neg Hx   . Hypertension Neg Hx     ROS: no fevers or chills, productive cough, hemoptysis, dysphasia, odynophagia, melena, hematochezia, dysuria, hematuria, rash, seizure activity, orthopnea, PND, pedal edema, claudication. Remaining systems are negative.  Physical Exam: Well-developed well-nourished in no acute distress.  Skin is warm and dry.  HEENT is normal.  Neck is supple.  Chest is clear to auscultation with normal expansion.  Cardiovascular exam is regular rate and rhythm.  Abdominal exam nontender or distended. No masses palpated. Extremities show no edema. neuro grossly intact  ECG- personally reviewed  A/P  1  Olga Millers, MD

## 2017-12-24 ENCOUNTER — Ambulatory Visit: Payer: Medicare Other | Admitting: Cardiology

## 2017-12-27 ENCOUNTER — Telehealth: Payer: Self-pay | Admitting: Emergency Medicine

## 2017-12-27 MED ORDER — TIOTROPIUM BROMIDE MONOHYDRATE 18 MCG IN CAPS: 18.0000 ug | ORAL_CAPSULE | Freq: Every day | RESPIRATORY_TRACT | 3 refills | Status: DC

## 2017-12-27 NOTE — Telephone Encounter (Signed)
Spoke with pt. Parts of his forms have been completed. Pt's daughter, Gavin Pound is going to be faxing over his income statements. Will keep message open to follow up on.

## 2017-12-27 NOTE — Telephone Encounter (Signed)
Spoke with Lesly Rubenstein, states that she has faxed over pt assistance forms for [employer].  Forms cannot be located on triage or front fax, or in RB's cubby or folder up front.  [employer] pt assistance forms printed and filled out to best of my ability, and placed in RB's cubby for lookout.   Routing to [employer] to follow up on.

## 2017-12-27 NOTE — Telephone Encounter (Signed)
Patient waiting in lobby in regards to patient assistance forms

## 2017-12-31 NOTE — Telephone Encounter (Signed)
Looked in RB's cubby, did find patient's PA forms but no income statements.   Left message for patient's daughter to follow up on income statements.

## 2018-01-01 NOTE — Telephone Encounter (Signed)
lmtcb X2 for daughter Lesly Rubenstein

## 2018-01-02 NOTE — Telephone Encounter (Signed)
LMTCB x3 for [employer]

## 2018-01-03 NOTE — Telephone Encounter (Signed)
Jade from [place] called to check on form status. She states that the daughter has already faxed her the income forms directly. Lesly Rubenstein is asking that we fax over a cover sheet with the completed forms and they can process the application. Fax# [phone] attnLesly Rubenstein, her phone # [phone]

## 2018-01-03 NOTE — Telephone Encounter (Signed)
Forms faxed to [facility] at fax number listed below. Called [name] [name] and was advised that the pt assistance forms will take 2 weeks to process and she is aware the forms have been faxed to her attention. The forms were given back to [place]. [name] [name] states she will call back when the form is processed.   Will forward to [place] to follow.

## 2018-01-14 ENCOUNTER — Ambulatory Visit (INDEPENDENT_AMBULATORY_CARE_PROVIDER_SITE_OTHER): Payer: Medicare HMO | Admitting: *Deleted

## 2018-01-14 DIAGNOSIS — Z95810 Presence of automatic (implantable) cardiac defibrillator: Secondary | ICD-10-CM | POA: Diagnosis not present

## 2018-01-14 DIAGNOSIS — I255 Ischemic cardiomyopathy: Secondary | ICD-10-CM

## 2018-01-14 DIAGNOSIS — I5042 Chronic combined systolic (congestive) and diastolic (congestive) heart failure: Secondary | ICD-10-CM

## 2018-01-14 NOTE — Progress Notes (Signed)
EPIC Encounter for ICM Monitoring  Patient Name: Finis E Chio is a 77 y.o. male Date: 01/14/2018 Primary Care Physican: Grisso, Greg A., MD Primary Cardiologist:Crenshaw Electrophysiologist: [name] Dry Weight:unknown Bi-V Pacing: 97% AT/AF Burden >99%        Heart Failure questions reviewed, pt asymptomatic.   Thoracic impedance normal.  Prescribed dosage: Furosemide 20 mg 2tablets (40 mg total) daily. Potassium 20 mEq 1tablets aday.   Labs: 09/17/2017 Creatinine1.31, BUN26, Potassium4.1, Sodium145, EGFR53-62 06/26/2017 Creatinine1.13, BUN27, Potassium5.1, Sodium143, EGFR64-74  06/19/2017 Creatinine1.31, BUN33, Potassium5.1, Sodium145, EGFR53-62  06/12/2017 Creatinine1.44, BUN41, Potassium5.5, Sodium143, EGFR47-55  05/29/2017 Creatinine1.59, BUN48, Potassium3.1, Sodium143, EGFR41-48  05/28/2017 Creatinine1.49, BUN41, Potassium3.3, Sodium144, EGFR48-55  05/25/2017 Creatinine1.41, BUN20, Potassium2.8, Sodium142, EGFR48-55 02/01/2017 Creatinine 1.44, BUN 23, Potassium 3.2. Sodium 146 08/15/2016 Creatinine 1.23, BUN 21, Potassium 3.6, Sodium 146  08/09/2016 Creatinine 1.36, BUN 21, Potassium 2.7, Sodium 143  07/10/2016 Creatinine 1.72, BUN 33, Potassium 5.1, Sodium 143  06/02/2016 Creatinine 1.44, BUN 20, Potassium 3.0, Sodium 144  Recommendations: No changes.  Encouraged to call for fluid symptoms.  Follow-up plan: ICM clinic phone appointment on 02/14/2018.    Copy of ICM check sent to Dr. [name].   3 month ICM trend: 01/14/2018    1 Year ICM trend:            Laurie S Short, RN 01/14/2018 3:41 PM   

## 2018-01-14 NOTE — Progress Notes (Signed)
Remote ICD transmission.   

## 2018-01-15 ENCOUNTER — Encounter: Payer: Self-pay | Admitting: Cardiology

## 2018-01-18 ENCOUNTER — Other Ambulatory Visit: Payer: Self-pay | Admitting: Cardiology

## 2018-01-18 NOTE — Telephone Encounter (Signed)
REFILL 

## 2018-01-19 ENCOUNTER — Other Ambulatory Visit: Payer: Self-pay | Admitting: Cardiology

## 2018-01-21 ENCOUNTER — Other Ambulatory Visit: Payer: Self-pay

## 2018-01-21 MED ORDER — AMLODIPINE BESYLATE 10 MG PO TABS: 5.0000 mg | ORAL_TABLET | Freq: Every day | ORAL | 1 refills | Status: AC

## 2018-01-21 MED ORDER — NITROGLYCERIN 0.4 MG SL SUBL: 0.4000 mg | SUBLINGUAL_TABLET | SUBLINGUAL | 3 refills | Status: AC | PRN

## 2018-01-21 NOTE — Telephone Encounter (Signed)
REFILL 

## 2018-01-22 ENCOUNTER — Encounter: Payer: Self-pay | Admitting: Cardiology

## 2018-01-23 ENCOUNTER — Other Ambulatory Visit: Payer: Self-pay

## 2018-01-23 DIAGNOSIS — I5023 Acute on chronic systolic (congestive) heart failure: Secondary | ICD-10-CM

## 2018-01-23 MED ORDER — BISOPROLOL FUMARATE 5 MG PO TABS: 2.5000 mg | ORAL_TABLET | Freq: Every day | ORAL | 6 refills | Status: AC

## 2018-01-23 MED ORDER — FUROSEMIDE 20 MG PO TABS: 40.0000 mg | ORAL_TABLET | Freq: Every day | ORAL | 0 refills | Status: DC

## 2018-01-23 MED ORDER — SPIRONOLACTONE 25 MG PO TABS: 25.0000 mg | ORAL_TABLET | Freq: Every day | ORAL | 0 refills | Status: DC

## 2018-02-09 LAB — CUP PACEART REMOTE DEVICE CHECK
Battery Remaining Longevity: 20 mo
Battery Remaining Percentage: 28 %
HIGH POWER IMPEDANCE MEASURED VALUE: 80 Ohm
HIGH POWER IMPEDANCE MEASURED VALUE: 80 Ohm
Implantable Lead Implant Date: 20130912
Implantable Lead Location: 753858
Implantable Lead Location: 753859
Lead Channel Impedance Value: 1275 Ohm
Lead Channel Impedance Value: 480 Ohm
Lead Channel Impedance Value: 490 Ohm
Lead Channel Pacing Threshold Pulse Width: 0.5 ms
Lead Channel Setting Pacing Amplitude: 2 V
Lead Channel Setting Pacing Amplitude: 4 V
Lead Channel Setting Pacing Pulse Width: 0.5 ms
Lead Channel Setting Pacing Pulse Width: 0.8 ms
MDC IDC LEAD IMPLANT DT: 20130912
MDC IDC LEAD IMPLANT DT: 20130912
MDC IDC LEAD LOCATION: 753860
MDC IDC MSMT BATTERY VOLTAGE: 2.86 V
MDC IDC MSMT LEADCHNL LV PACING THRESHOLD AMPLITUDE: 3 V
MDC IDC MSMT LEADCHNL LV PACING THRESHOLD PULSEWIDTH: 0.8 ms
MDC IDC MSMT LEADCHNL RA SENSING INTR AMPL: 1.8 mV
MDC IDC MSMT LEADCHNL RV PACING THRESHOLD AMPLITUDE: 0.5 V
MDC IDC MSMT LEADCHNL RV SENSING INTR AMPL: 12 mV
MDC IDC PG IMPLANT DT: 20130912
MDC IDC PG SERIAL: 1075181
MDC IDC SESS DTM: 20190204090017
MDC IDC SET LEADCHNL RV SENSING SENSITIVITY: 0.5 mV

## 2018-02-14 ENCOUNTER — Telehealth: Payer: Self-pay

## 2018-02-14 ENCOUNTER — Ambulatory Visit (INDEPENDENT_AMBULATORY_CARE_PROVIDER_SITE_OTHER): Payer: Medicare HMO

## 2018-02-14 DIAGNOSIS — Z95810 Presence of automatic (implantable) cardiac defibrillator: Secondary | ICD-10-CM | POA: Diagnosis not present

## 2018-02-14 DIAGNOSIS — I5042 Chronic combined systolic (congestive) and diastolic (congestive) heart failure: Secondary | ICD-10-CM | POA: Diagnosis not present

## 2018-02-14 NOTE — Telephone Encounter (Signed)
Spoke with pt and reminded pt of remote transmission that is due today. Pt verbalized understanding.   

## 2018-02-15 NOTE — Progress Notes (Signed)
EPIC Encounter for ICM Monitoring  Patient Name: [name] [name] is a 77 y.o. male Date: 02/15/2018 Primary Care Physican: [name] Mina., MD Primary Cardiologist:[name] [name] Electrophysiologist: [name] [name] Dry Weight: 201 lbs Bi-V Pacing: 97%  AT/AF Burden 43%      Heart Failure questions reviewed, pt asymptomatic.   Thoracic impedance normal.  Prescribed dosage: Furosemide 20 mg 2tablets (40 mg total) daily. Potassium 20 mEq 1tablets aday.   Labs: 09/17/2017 Creatinine1.31Fabian [name], Potassium4.1, [id number], [id number] 06/26/2017 Creatinine1.13, BUN27, Potassium5.1, [id number], [id number]  06/19/2017 Creatinine1.31, BUN33, Potassium5.1, [id number], [id number]  06/12/2017 Creatinine1.44, BUN41, Potassium5.5, [id number], [id number]  05/29/2017 Creatinine1.59, BUN48, Potassium3.1, [id number], [id number]  05/28/2017 Creatinine1.49, BUN41, Potassium3.3, HGRJWB125, [id number]  05/25/2017 Creatinine1.41, BUN20, Potassium2.8, [id number], [id number] 02/01/2017 Creatinine 1.44, BUN 23, Potassium 3.2. Sodium 146 08/15/2016 Creatinine 1.23, BUN 21, Potassium 3.6, Sodium 146  08/09/2016 Creatinine 1.36, BUN 21, Potassium 2.7, Sodium 143  07/10/2016 Creatinine 1.72, BUN 33, Potassium 5.1, Sodium 143  06/02/2016 Creatinine 1.44, BUN 20, Potassium 3.0, Sodium 144  Recommendations:  No changes.   Encouraged to call for fluid symptoms.  Follow-up plan: ICM clinic phone appointment on 03/18/2018.  Office appointment scheduled 03/08/2018 with Dr. Stanford [name].  Copy of ICM check sent to Dr. Rayann [name].   3 month ICM trend: 02/15/2018    1 Year ICM trend:       [name] Billings, RN 02/15/2018 10:52 AM

## 2018-02-20 ENCOUNTER — Encounter: Payer: Medicare HMO | Admitting: *Deleted

## 2018-02-20 VITALS — BP 121/77 | HR 80 | Wt 197.0 lb

## 2018-02-20 DIAGNOSIS — Z006 Encounter for examination for normal comparison and control in clinical research program: Secondary | ICD-10-CM

## 2018-02-20 NOTE — Progress Notes (Signed)
RESEARCH ENCOUNTER  Patient ID: [name] [name]  DOB: 1941-06-22  [name] [name] presented to the [facility] for the Week 24visit of the [facility].  No signs/symptoms of ACS since the last visit. Subject compliant with IP, IP returned, and additional IP dispensed.  Subject stated that he had cataract surgery December 6, right eye, and December 20, left eye.  Vision improved greatly.  This weekend, he is planning to go prep his camper for the summer.  Very pleasant, as always.  Patient will follow up with Research Clinic in 12 weeks.

## 2018-02-27 NOTE — Progress Notes (Signed)
HPI: [name] [name] s/p CABG, ischemic CM, combined systolic and diastolic CHF, AF, COPD, s/p CRT-D, s/p AV node ablation.He also has a hx of CEA. He underwent aortic and iliac artery aneurysm repair by Dr. [name] in 1/17 with a aorta to bilateral CIA bypass. Watchman device implant (placed 6/17).   Since last seen,  [name] [name] denies dyspnea, chest pain, palpitations or syncope.  Echo 10/18 showed ejection fraction 20-25%, grade 2 diastolic dysfunction, moderate mitral regurgitation and biatrial enlargement.   Myoview 12/16 EF 25%, inf scar, no ischemia, Intermediate Risk due to reduced EF  Carotid US 11/16 R < 40%; L CEA patent with < 40%  TEE 8/17 EF 25-30; mild to moderate MR; watchman device in place with residual lobe not covered by device   LHC 8/13 Left mainstem: LM normal.  Left anterior descending (LAD): 100 proximal. LAD fills via LIMA. No high grade distal disease. D1 moderated sized and fills via SVG. Left circumflex (LCx): AV groove diffuse luminal irregularities. Mid 25%. Small OM2 with ostial 95%. MOM with subtotal stenosis. There is filling of two moderate sized OMs with SVG. Both of these vessels are occluded proximally but demonstrated no high grade disease after the graft anastomosis.  Right coronary artery (RCA): Occluded proximally. PDA is moderate sized and fills via SVG. Two moderate sized PLs backfill via this graft and are free if disease. Grafts:  LIMA: Patent to the LAD. SVG to Diag: Patent with proximal circumferential 50% proximal stenosis and mild luminal irregularities. SVG to OM x 2: Patent with mild luminal irregularities. SVG to RCA: Mild luminal irregularities with mid 40% stenosis. Left ventriculography: Left ventricular systolic function is reduced, LVEF is estimated at 35%, there is no significant mitral regurgitation.   RHC 6/18 showed: RA = 5 RV = 44/4 PA = 41/18 (26) PCW = 12 (no significant v-waves) Fick  cardiac output/index = 4.9/2.4 PVR = 1.5 WU FA sat = 89% PA sat = 56%  Current Outpatient Medications  Medication Sig Dispense Refill  . albuterol (PROVENTIL HFA;VENTOLIN HFA) 108 (90 Base) MCG/ACT inhaler Inhale 2 puffs into the lungs every 6 (six) hours as needed for wheezing or shortness of breath. 3 Inhaler 3  . albuterol (PROVENTIL) (2.5 MG/3ML) 0.083% nebulizer solution USE ONE VIAL IN NEBULIZER EVERY 4 HOURS AS NEEDED FOR WHEEZING AND FOR SHORTNESS OF BREATH 360 mL 11  . amLODipine (NORVASC) 10 MG tablet Take 0.5 tablets (5 mg total) by mouth daily. 90 tablet 1  . aspirin EC 325 MG EC tablet Take 1 tablet (325 mg total) by mouth daily.    . bisoprolol (ZEBETA) 5 MG tablet Take 0.5 tablets (2.5 mg total) by mouth daily. 30 tablet 6  . budesonide-formoterol (SYMBICORT) 160-4.5 MCG/ACT inhaler Inhale 2 puffs into the lungs 2 (two) times daily. 3 Inhaler 3  . citalopram (CELEXA) 10 MG tablet Take 10 mg by mouth daily. Reported on 06/06/2016    . furosemide (LASIX) 20 MG tablet Take 2 tablets (40 mg total) by mouth daily. 180 tablet 0  . Investigational - Study Medication Take 1 tablet by mouth 2 (two) times daily. Study name: Galactic Heart Failure Research Study Additional study details: Omecamtiv Mecarbil or Placebo 1 each PRN  . KLOR-CON M20 20 MEQ tablet TAKE 2 TABLETS THREE TIMES DAILY. PLEASE SCHEDULE APPOINTMENT FOR REFILLS. 540 tablet 0  . Multiple Vitamin (MULITIVITAMIN WITH MINERALS) TABS Take 1 tablet by mouth daily.    . niacin (NIASPAN) 500 MG CR tablet  Take 1,000 mg by mouth 2 (two) times daily.     . nitroGLYCERIN (NITROSTAT) 0.4 MG SL tablet Place 1 tablet (0.4 mg total) under the tongue every 5 (five) minutes as needed for chest pain. X 3 doses 25 tablet 3  . pravastatin (PRAVACHOL) 80 MG tablet TAKE 1 TABLET EVERY DAY 90 tablet 0  . sacubitril-valsartan (ENTRESTO) 49-51 MG Take 1 tablet by mouth 2 (two) times daily. 60 tablet 1  . spironolactone (ALDACTONE) 25 MG tablet  Take 1 tablet (25 mg total) by mouth daily. 90 tablet 0  . tiotropium (SPIRIVA HANDIHALER) 18 MCG inhalation capsule Place 1 capsule (18 mcg total) into inhaler and inhale daily. 90 capsule 3   No current facility-administered medications for this visit.      Past Medical History:  Diagnosis Date  . Aortic dissection (HCC)    H/O focal aortic dissection  . [name] (coronary artery disease)    a. s/p CABG in 1998 with LIMA-LAD, SVG-D1, SVG-OM2-OM3, and SVG-RCA b. cath 10/07:  LM ok, LAD occluded, AV CFX 95%, pRCA occluded; L-LAD, S-OM2/OM3, S-PDA ok c. 2013: cath showing patent grafts  . Cerebrovascular disease    a. s/p prior Left CEA;  followed by Dr. [name]  . CHF (congestive heart failure) (HCC)    a. s/p STJ CRTD  . CKD (chronic kidney disease)   . COPD (chronic obstructive pulmonary disease) (HCC)   . Depression   . DVT (deep venous thrombosis) (HCC)   . History of stomach ulcers   . Hyperlipidemia   . Hypertension   . Iliac artery aneurysm, left (HCC)    followed by Dr. [name]  . Ischemic cardiomyopathy    a. s/p St. Jude CRTD placement in 2013  . LBBB (left bundle branch block) 2006  . Myocardial infarction (HCC) 1997  . Nephrolithiasis   . PAF (paroxysmal atrial fibrillation) (HCC) 2006   a. on amio and pradaxa. b. s/p AVN ablation  . Stroke [facility]) 12/2015 "several mini"   a. on coumadin --> switched to pradaxa b. s/p Watchman in 05/2016    Past Surgical History:  Procedure Laterality Date  . ABDOMINAL AORTIC ANEURYSM REPAIR N/A 01/03/2016   Procedure: ANEURYSM ABDOMINAL AORTA, BILATERAL COMMON ILIAC REPAIR;  Surgeon: [name], MD;  Location: [facility] OR;  Service: Vascular;  Laterality: N/A;  . ABDOMINAL AORTIC ANEURYSM REPAIR  01/03/2016  . APPENDECTOMY  09/11/11  . BACK SURGERY    . BI-VENTRICULAR IMPLANTABLE CARDIOVERTER DEFIBRILLATOR N/A 08/22/2012   SJM Quadra Assura BiV ICD implanted by Dr [name]  . CARDIAC CATHETERIZATION  1998; 2013  . CARDIOVERSION N/A 07/26/2016     Procedure: CARDIOVERSION;  Surgeon: Lars Masson, MD;  Location: [facility] ENDOSCOPY;  Service: Cardiovascular;  Laterality: N/A;  . CAROTID ENDARTERECTOMY Left July  2005  . CORONARY ARTERY BYPASS GRAFT  1998   CABG X5  . CYSTOSCOPY W/ STONE MANIPULATION  X 2  . ELECTROPHYSIOLOGIC STUDY N/A 08/17/2016   AVN ablation by Dr [name]  . ESOPHAGOGASTRODUODENOSCOPY N/A 02/28/2016   Procedure: ESOPHAGOGASTRODUODENOSCOPY (EGD);  Surgeon: [name], MD;  Location: [facility] ENDOSCOPY;  Service: Endoscopy;  Laterality: N/A;  . LEFT ATRIAL APPENDAGE OCCLUSION N/A 06/01/2016   Watchman device implanted by Dr [name]  . POSTERIOR FUSION LUMBAR SPINE  Sep 27, 2014   L4-L5  . PR VEIN BYPASS GRAFT,AORTO-FEM-POP  01/03/2016  . RIGHT HEART CATH N/A 05/28/2017   Procedure: Right Heart Cath;  Surgeon: Dolores Patty, MD;  Location: [facility] INVASIVE  CV LAB;  Service: Cardiovascular;  Laterality: N/A;  . TEE WITHOUT CARDIOVERSION N/A 07/26/2016   Procedure: TRANSESOPHAGEAL ECHOCARDIOGRAM (TEE);  Surgeon: Lars Masson, MD;  Location: [facility] ENDOSCOPY;  Service: Cardiovascular;  Laterality: N/A;    Social History   Socioeconomic History  . Marital status: Widowed    Spouse name: Not on file  . Number of children: 3  . Years of education: 10 TH  . Highest education level: Not on file  Occupational History  . Occupation: RETIRED---truck Air traffic controller: RETIRED  Social Needs  . Financial resource strain: Not on file  . Food insecurity:    Worry: Not on file    Inability: Not on file  . Transportation needs:    Medical: Not on file    Non-medical: Not on file  Tobacco Use  . Smoking status: Former Smoker    Packs/day: 0.50    Years: 48.00    Pack years: 24.00    Types: Cigarettes    Last attempt to quit: 05/24/2017    Years since quitting: 0.7  . Smokeless tobacco: Never Used  . Tobacco comment: quit 05/25/17  Substance and Sexual Activity  . Alcohol use: Yes    Alcohol/week: 0.0 oz    Comment:  06/01/2016 "haven't had a drink in years"  . Drug use: No  . Sexual activity: Yes  Lifestyle  . Physical activity:    Days per week: Not on file    Minutes per session: Not on file  . Stress: Not on file  Relationships  . Social connections:    Talks on phone: Not on file    Gets together: Not on file    Attends religious service: Not on file    Active member of club or organization: Not on file    Attends meetings of clubs or organizations: Not on file    Relationship status: Not on file  . Intimate partner violence:    Fear of current or ex partner: Not on file    Emotionally abused: Not on file    Physically abused: Not on file    Forced sexual activity: Not on file  Other Topics Concern  . Not on file  Social History Narrative   [name] [name] is married with 3 children.   [name] [name] is right handed.   [name] [name] has 10 th grade education.   [name] [name] drinks tea occasionally.   06/25/17 son lives with him    Family History  Problem Relation Age of Onset  . Stroke Maternal Uncle   . Stroke Maternal Uncle   . Heart attack Neg Hx   . Hypertension Neg Hx     ROS: no fevers or chills, productive cough, hemoptysis, dysphasia, odynophagia, melena, hematochezia, dysuria, hematuria, rash, seizure activity, orthopnea, PND, pedal edema, claudication. Remaining systems are negative.  Physical Exam: Well-developed well-nourished in no acute distress.  Skin is warm and dry.  HEENT is normal.  Neck is supple.  Chest diminished BS throughout Cardiovascular exam is regular rate and rhythm.  Abdominal exam nontender or distended. No masses palpated. Extremities show no edema. neuro grossly intact  ECG-ventricular pacing.  Personally reviewed  A/P  1 coronary artery disease-[name] [name] denies chest pain.  Plan continue medical therapy with aspirin and statin.  2 permanent atrial fibrillation-[name] [name] is status post AV node ablation and also watchman device.  3 ischemic cardiomyopathy-continue  Entresto and beta-blocker.  4 chronic systolic congestive heart failure-he appears to be euvolemic.  Continue present dose of diuretic.  [name] [name] instructed on low-sodium diet and fluid restriction.  Potassium and renal function monitored by primary care.  5 hyperlipidemia-continue statin.  Lipids and liver monitored by primary care.  6 prior ICD-followed by electrophysiology.  7 tobacco abuse-[name] [name] counseled on discontinuing.  8 moderate mitral regurgitation-we will need follow-up echoes in the future.  9 history of carotid endarterectomy-schedule follow-up carotid Dopplers.  Olga Millers, MD

## 2018-03-08 ENCOUNTER — Ambulatory Visit: Payer: Medicare HMO | Admitting: Cardiology

## 2018-03-08 ENCOUNTER — Encounter: Payer: Self-pay | Admitting: Cardiology

## 2018-03-08 VITALS — BP 118/76 | HR 76 | Ht 73.0 in | Wt 196.8 lb

## 2018-03-08 DIAGNOSIS — I255 Ischemic cardiomyopathy: Secondary | ICD-10-CM | POA: Diagnosis not present

## 2018-03-08 DIAGNOSIS — I482 Chronic atrial fibrillation: Secondary | ICD-10-CM

## 2018-03-08 DIAGNOSIS — I679 Cerebrovascular disease, unspecified: Secondary | ICD-10-CM

## 2018-03-08 DIAGNOSIS — I4821 Permanent atrial fibrillation: Secondary | ICD-10-CM

## 2018-03-08 NOTE — Patient Instructions (Signed)
Medication Instructions:   NO CHANGE  Testing/Procedures:  Your physician has requested that you have a carotid duplex. This test is an ultrasound of the carotid arteries in your neck. It looks at blood flow through these arteries that supply the brain with blood. Allow one hour for this exam. There are no restrictions or special instructions.    Follow-Up:  Your physician recommends that you schedule a follow-up appointment in: 6 MONTHS WITH DR [name]   If you need a refill on your cardiac medications before your next appointment, please call your pharmacy.

## 2018-03-11 ENCOUNTER — Other Ambulatory Visit: Payer: Self-pay | Admitting: Cardiology

## 2018-03-11 DIAGNOSIS — I6523 Occlusion and stenosis of bilateral carotid arteries: Secondary | ICD-10-CM

## 2018-03-18 ENCOUNTER — Ambulatory Visit (INDEPENDENT_AMBULATORY_CARE_PROVIDER_SITE_OTHER): Payer: Medicare HMO

## 2018-03-18 DIAGNOSIS — I5042 Chronic combined systolic (congestive) and diastolic (congestive) heart failure: Secondary | ICD-10-CM

## 2018-03-18 DIAGNOSIS — Z95810 Presence of automatic (implantable) cardiac defibrillator: Secondary | ICD-10-CM | POA: Diagnosis not present

## 2018-03-18 NOTE — Progress Notes (Signed)
EPIC Encounter for ICM Monitoring  Patient Name: [name] [name] is a 77 y.o. male Date: 03/18/2018 Primary Care Physican: Raina Mina., MD Primary Cardiologist:Crenshaw Electrophysiologist: [name] Dry Weight: 201 lbs Bi-V Pacing: 97%  AT/AF Burden 44%      Heart Failure questions reviewed, pt asymptomatic.  Patient eats out at restaurants and does not review any food labels for salt content.  Advised to read the labels and limit salt to 2000 mg daily.  Advised restaurant foods are very high in salt.     Thoracic impedance normal.  Prescribed dosage: Furosemide 20 mg 2tablets (40 mg total) daily. Potassium 20 mEq 1tablets aday.   Labs: 03/05/2018 Creatinine 1.30, BUN 13, Potassium 3.4, Sodium 147, EGFR 53-62 Care Everywhere 09/17/2017 Creatinine1.31, BUN26, Potassium4.1, [id number], [id number] 06/26/2017 Creatinine1.13, BUN27, Potassium5.1, [id number], [id number]  06/19/2017 Creatinine1.31, BUN33, Potassium5.1, [id number], [id number]  06/12/2017 Creatinine1.44, BUN41, Potassium5.5, [id number], [id number]  05/29/2017 Creatinine1.59, BUN48, Potassium3.1, Sodium143, [id number]  05/28/2017 Creatinine1.49, BUN41, Potassium3.3, [id number], [id number]  05/25/2017 Creatinine1.41, BUN20, Potassium2.8, [id number], [id number] 02/01/2017 Creatinine 1.44, BUN 23, Potassium 3.2. Sodium 146 08/15/2016 Creatinine 1.23, BUN 21, Potassium 3.6, Sodium 146  08/09/2016 Creatinine 1.36, BUN 21, Potassium 2.7, Sodium 143  07/10/2016 Creatinine 1.72, BUN 33, Potassium 5.1, Sodium 143  06/02/2016 Creatinine 1.44, BUN 20, Potassium 3.0, Sodium 144  Recommendations: No changes.  Encouraged to call for fluid symptoms.  Follow-up plan: ICM clinic phone appointment on 04/18/2018.    Copy of ICM check sent to Dr. Rayann Heman.   3 month ICM trend: 03/18/2018    1 Year ICM trend:       Rosalene Billings, RN 03/18/2018 2:19 PM

## 2018-03-19 ENCOUNTER — Ambulatory Visit (HOSPITAL_COMMUNITY)
Admission: RE | Admit: 2018-03-19 | Discharge: 2018-03-19 | Disposition: A | Discharge status: Home / Self Care | Payer: Medicare HMO | Source: Ambulatory Visit | Attending: Cardiology | Admitting: Cardiology

## 2018-03-19 ENCOUNTER — Other Ambulatory Visit: Payer: Self-pay | Admitting: *Deleted

## 2018-03-19 DIAGNOSIS — I6523 Occlusion and stenosis of bilateral carotid arteries: Secondary | ICD-10-CM | POA: Insufficient documentation

## 2018-03-19 DIAGNOSIS — E785 Hyperlipidemia, unspecified: Secondary | ICD-10-CM | POA: Insufficient documentation

## 2018-03-19 DIAGNOSIS — I10 Essential (primary) hypertension: Secondary | ICD-10-CM | POA: Insufficient documentation

## 2018-03-19 DIAGNOSIS — I2510 Atherosclerotic heart disease of native coronary artery without angina pectoris: Secondary | ICD-10-CM | POA: Insufficient documentation

## 2018-03-19 DIAGNOSIS — I679 Cerebrovascular disease, unspecified: Secondary | ICD-10-CM

## 2018-03-19 DIAGNOSIS — R42 Dizziness and giddiness: Secondary | ICD-10-CM | POA: Diagnosis not present

## 2018-03-19 DIAGNOSIS — Z8673 Personal history of transient ischemic attack (TIA), and cerebral infarction without residual deficits: Secondary | ICD-10-CM | POA: Insufficient documentation

## 2018-04-01 ENCOUNTER — Other Ambulatory Visit: Payer: Self-pay | Admitting: Cardiology

## 2018-04-01 DIAGNOSIS — I5023 Acute on chronic systolic (congestive) heart failure: Secondary | ICD-10-CM

## 2018-04-02 NOTE — Telephone Encounter (Signed)
REFILL 

## 2018-04-18 ENCOUNTER — Ambulatory Visit (INDEPENDENT_AMBULATORY_CARE_PROVIDER_SITE_OTHER): Payer: Medicare HMO | Admitting: *Deleted

## 2018-04-18 DIAGNOSIS — I255 Ischemic cardiomyopathy: Secondary | ICD-10-CM

## 2018-04-18 DIAGNOSIS — I5042 Chronic combined systolic (congestive) and diastolic (congestive) heart failure: Secondary | ICD-10-CM

## 2018-04-18 DIAGNOSIS — Z95810 Presence of automatic (implantable) cardiac defibrillator: Secondary | ICD-10-CM

## 2018-04-18 NOTE — Progress Notes (Signed)
EPIC Encounter for ICM Monitoring  Patient Name: [name] [name] is a 77 y.o. male Date: 04/18/2018 Primary Care Physican: Raina Mina., MD Primary Cardiologist:Crenshaw Electrophysiologist: [name] Dry Weight: 200 lbs Bi-V Pacing: 97%   Heart Failure questions reviewed, pt asymptomatic.   Thoracic impedance normal.  Prescribed dosage: Furosemide 20 mg 2tablets (40 mg total) daily. Potassium 20 mEq 1tablets aday.   Labs: 03/05/2018 Creatinine 1.30, BUN 13, Potassium 3.4, Sodium 147, EGFR 53-62 Care Everywhere 09/17/2017 Creatinine1.31, BUN26, Potassium4.1, [id number], [id number] 06/26/2017 Creatinine1.13, BUN27, Potassium5.1, [id number], [id number]  06/19/2017 Creatinine1.31, BUN33, Potassium5.1, [id number], [id number]  06/12/2017 Creatinine1.44, BUN41, Potassium5.5, VQQUIV146, [id number]  05/29/2017 Creatinine1.59, BUN48, Potassium3.1, Sodium143, WPTY03-49  05/28/2017 Creatinine1.49, BUN41, Potassium3.3, [id number], [id number]  05/25/2017 Creatinine1.41, BUN20, Potassium2.8, [id number], VHSJ29-09 02/01/2017 Creatinine 1.44, BUN 23, Potassium 3.2. Sodium 146 08/15/2016 Creatinine 1.23, BUN 21, Potassium 3.6, Sodium 146  08/09/2016 Creatinine 1.36, BUN 21, Potassium 2.7, Sodium 143  07/10/2016 Creatinine 1.72, BUN 33, Potassium 5.1, Sodium 143  06/02/2016 Creatinine 1.44, BUN 20, Potassium 3.0, Sodium 144  Recommendations: No changes.  Encouraged to call for fluid symptoms.  Follow-up plan: ICM clinic phone appointment on 05/20/2018.  Patient overdue to schedule an appointment with Dr Rayann Heman. Last EP visit was with Chanetta Marshall, NP on 02/01/2018.  Copy of ICM check sent to Dr. Rayann Heman.   3 month ICM trend: 04/18/2018    1 Year ICM trend:       Rosalene Billings, RN 04/18/2018 3:47 PM

## 2018-04-18 NOTE — Progress Notes (Signed)
Remote ICD transmission.   

## 2018-04-23 ENCOUNTER — Encounter: Payer: Self-pay | Admitting: Cardiology

## 2018-05-07 ENCOUNTER — Encounter: Payer: Self-pay | Admitting: Emergency Medicine

## 2018-05-07 ENCOUNTER — Ambulatory Visit: Payer: Medicare HMO | Admitting: Emergency Medicine

## 2018-05-07 DIAGNOSIS — J449 Chronic obstructive pulmonary disease, unspecified: Secondary | ICD-10-CM | POA: Diagnosis not present

## 2018-05-07 DIAGNOSIS — F17200 Nicotine dependence, unspecified, uncomplicated: Secondary | ICD-10-CM

## 2018-05-07 NOTE — Assessment & Plan Note (Signed)
He is smoking less. Discussed cessation with him today.

## 2018-05-07 NOTE — Progress Notes (Signed)
  Subjective:    Patient ID: [name] [name], male    DOB: 1941-04-10, 77 y.o.   MRN: [id number] HPI   ROV 10/01/17 -- This follow-up visit for patient with a history of tobacco use, coronary disease, hypertension, atrial fibrillation, MR with decreased LV fxn. I have followed him for mild obstruction and COPD. He had never truly benefited significantly from scheduled bronchodilators on our past evaluations. He was admitted with an apparent acute exacerbation of COPD + his cardiac disease June 2018, was discharged on Spiriva and Symbicort. He underwent pulmonary function testing today and I have reviewed. This shows mild obstruction, FEV1 3.32 L (100% predicted). Some evidence for restriction based on decreased residual volume, decreased diffusion capacity that does not correct when adjusted for alveolar volume. He is smoking about a pack a week.   ROV 05/07/18 --pleasant 77 year old man with coronary disease, hypertension and A. fib, MR.  He also has a history of tobacco use (less, not every day) and mild obstruction confirmed on pulmonary function testing, some associated restriction based on his residual volume.  At his last visit he had been started on bronchodilators.  We continued him on Spiriva to see if he felt any benefit. He isn't taking spiriva every day. He feels that his breathing is doing well - he is reliable with spiriva. He had an episode in the last week when he was exposed to dust / pollen, needed [place] [facility] to [place] some, then felt better. He has been doing some coughing lately, non-productive. Minimal wheeze.     Objective:   Physical Exam Vitals:   05/07/18 1007  BP: 130/80  Pulse: (!) 47  SpO2: 100%  Weight: 195 lb (88.5 kg)  Height: 6\' 1"  (1.854 m)   Gen: Pleasant, obese, in no distress,  normal affect  ENT: No lesions, mouth clear.   Neck: No JVD, no stridor  Lungs: No use of accessory muscles, no  wheeze  Cardiovascular: RRR, heart sounds normal, no murmur or gallops, no peripheral edema  Musculoskeletal: No deformities, no cyanosis or clubbing  Neuro: alert, non focal  Skin: Warm, no lesions or rashes     Assessment & Plan:  Tobacco use disorder He is smoking less. Discussed cessation with him today.   COPD (chronic obstructive pulmonary disease) (HCC) No exacerbations. Occasional SABA use even on irregular Spiriva usage pattern - uses prn. We discussed increasing to qd spiriva to see if he benefits - he agrees.   We will try using Spiriva once a day, every day, to see if you get benefit.  Keep your albuterol available to use 2 puffs or one nebulizer treatment as needed for shortness of breath.  Get your flu shot in the Fall Follow with Dr Delton Coombes in 6 months or sooner if you have any problems   Levy Pupa, MD, PhD 05/07/2018, 10:33 AM [facility] [phone] or if no answer [phone]

## 2018-05-07 NOTE — Patient Instructions (Addendum)
We will try using Spiriva once a day, every day, to see if you get benefit.  Keep your albuterol available to use 2 puffs or one nebulizer treatment as needed for shortness of breath.  Get your flu shot in the Fall Follow with Dr Delton Coombes in 6 months or sooner if you have any problems

## 2018-05-07 NOTE — Assessment & Plan Note (Signed)
No exacerbations. Occasional SABA use even on irregular Spiriva usage pattern - uses prn. We discussed increasing to qd spiriva to see if he benefits - he agrees.   We will try using Spiriva once a day, every day, to see if you get benefit.  Keep your albuterol available to use 2 puffs or one nebulizer treatment as needed for shortness of breath.  Get your flu shot in the Fall Follow with Dr Delton Coombes in 6 months or sooner if you have any problems

## 2018-05-15 ENCOUNTER — Encounter: Payer: Self-pay | Admitting: *Deleted

## 2018-05-15 DIAGNOSIS — Z006 Encounter for examination for normal comparison and control in clinical research program: Secondary | ICD-10-CM

## 2018-05-15 NOTE — Progress Notes (Signed)
Patient ID: BAYLIN SUBA  DOB: 01/01/1941  [name] [name] presented to [facility] for the Week 36 visit of the [facility].  No signs/symptoms of ACS since the last visit. Subject compliant with IP, IP returned, and additional IP dispensed.

## 2018-05-16 ENCOUNTER — Other Ambulatory Visit: Payer: Self-pay

## 2018-05-16 ENCOUNTER — Encounter (HOSPITAL_COMMUNITY): Payer: Self-pay | Admitting: Emergency Medicine

## 2018-05-16 ENCOUNTER — Emergency Department (HOSPITAL_COMMUNITY): Payer: Medicare HMO

## 2018-05-16 ENCOUNTER — Inpatient Hospital Stay (HOSPITAL_COMMUNITY)
Admission: EM | Admit: 2018-05-16 | Discharge: 2018-05-18 | DRG: 190 | Disposition: A | Discharge status: Home / Self Care | Payer: Medicare HMO | Attending: Family Medicine | Admitting: Family Medicine

## 2018-05-16 DIAGNOSIS — Z7951 Long term (current) use of inhaled steroids: Secondary | ICD-10-CM | POA: Diagnosis not present

## 2018-05-16 DIAGNOSIS — Z8673 Personal history of transient ischemic attack (TIA), and cerebral infarction without residual deficits: Secondary | ICD-10-CM | POA: Diagnosis not present

## 2018-05-16 DIAGNOSIS — J9811 Atelectasis: Secondary | ICD-10-CM | POA: Diagnosis present

## 2018-05-16 DIAGNOSIS — Z95810 Presence of automatic (implantable) cardiac defibrillator: Secondary | ICD-10-CM | POA: Diagnosis present

## 2018-05-16 DIAGNOSIS — I25700 Atherosclerosis of coronary artery bypass graft(s), unspecified, with unstable angina pectoris: Secondary | ICD-10-CM | POA: Diagnosis not present

## 2018-05-16 DIAGNOSIS — I480 Paroxysmal atrial fibrillation: Secondary | ICD-10-CM | POA: Diagnosis present

## 2018-05-16 DIAGNOSIS — Z720 Tobacco use: Secondary | ICD-10-CM

## 2018-05-16 DIAGNOSIS — Z79899 Other long term (current) drug therapy: Secondary | ICD-10-CM

## 2018-05-16 DIAGNOSIS — E875 Hyperkalemia: Secondary | ICD-10-CM | POA: Diagnosis not present

## 2018-05-16 DIAGNOSIS — I130 Hypertensive heart and chronic kidney disease with heart failure and stage 1 through stage 4 chronic kidney disease, or unspecified chronic kidney disease: Secondary | ICD-10-CM | POA: Diagnosis present

## 2018-05-16 DIAGNOSIS — N183 Chronic kidney disease, stage 3 (moderate): Secondary | ICD-10-CM | POA: Diagnosis present

## 2018-05-16 DIAGNOSIS — I5042 Chronic combined systolic (congestive) and diastolic (congestive) heart failure: Secondary | ICD-10-CM

## 2018-05-16 DIAGNOSIS — F17210 Nicotine dependence, cigarettes, uncomplicated: Secondary | ICD-10-CM | POA: Diagnosis present

## 2018-05-16 DIAGNOSIS — I252 Old myocardial infarction: Secondary | ICD-10-CM | POA: Diagnosis not present

## 2018-05-16 DIAGNOSIS — Z981 Arthrodesis status: Secondary | ICD-10-CM

## 2018-05-16 DIAGNOSIS — I714 Abdominal aortic aneurysm, without rupture, unspecified: Secondary | ICD-10-CM | POA: Diagnosis present

## 2018-05-16 DIAGNOSIS — N179 Acute kidney failure, unspecified: Secondary | ICD-10-CM | POA: Diagnosis not present

## 2018-05-16 DIAGNOSIS — I255 Ischemic cardiomyopathy: Secondary | ICD-10-CM | POA: Diagnosis present

## 2018-05-16 DIAGNOSIS — Z7982 Long term (current) use of aspirin: Secondary | ICD-10-CM

## 2018-05-16 DIAGNOSIS — Z951 Presence of aortocoronary bypass graft: Secondary | ICD-10-CM

## 2018-05-16 DIAGNOSIS — J9601 Acute respiratory failure with hypoxia: Secondary | ICD-10-CM | POA: Diagnosis present

## 2018-05-16 DIAGNOSIS — J441 Chronic obstructive pulmonary disease with (acute) exacerbation: Principal | ICD-10-CM | POA: Diagnosis present

## 2018-05-16 DIAGNOSIS — I25810 Atherosclerosis of coronary artery bypass graft(s) without angina pectoris: Secondary | ICD-10-CM | POA: Diagnosis present

## 2018-05-16 DIAGNOSIS — I2510 Atherosclerotic heart disease of native coronary artery without angina pectoris: Secondary | ICD-10-CM | POA: Diagnosis present

## 2018-05-16 DIAGNOSIS — E785 Hyperlipidemia, unspecified: Secondary | ICD-10-CM | POA: Diagnosis present

## 2018-05-16 DIAGNOSIS — F329 Major depressive disorder, single episode, unspecified: Secondary | ICD-10-CM | POA: Diagnosis present

## 2018-05-16 LAB — CBC WITH DIFFERENTIAL/PLATELET
ABS IMMATURE GRANULOCYTES: 0.1 10*3/uL (ref 0.0–0.1)
BASOS ABS: 0.1 10*3/uL (ref 0.0–0.1)
Basophils Relative: 1 %
EOS PCT: 7 %
Eosinophils Absolute: 0.6 10*3/uL (ref 0.0–0.7)
HEMATOCRIT: 55.1 % — AB (ref 39.0–52.0)
HEMOGLOBIN: 17.5 g/dL — AB (ref 13.0–17.0)
IMMATURE GRANULOCYTES: 1 %
LYMPHS ABS: 2.4 10*3/uL (ref 0.7–4.0)
LYMPHS PCT: 27 %
MCH: 31 pg (ref 26.0–34.0)
MCHC: 31.8 g/dL (ref 30.0–36.0)
MCV: 97.7 fL (ref 78.0–100.0)
Monocytes Absolute: 0.6 10*3/uL (ref 0.1–1.0)
Monocytes Relative: 7 %
NEUTROS ABS: 5.2 10*3/uL (ref 1.7–7.7)
NEUTROS PCT: 57 %
Platelets: 174 10*3/uL (ref 150–400)
RBC: 5.64 MIL/uL (ref 4.22–5.81)
RDW: 14.5 % (ref 11.5–15.5)
WBC: 9 10*3/uL (ref 4.0–10.5)

## 2018-05-16 LAB — CUP PACEART REMOTE DEVICE CHECK
Battery Remaining Longevity: 22 mo
Battery Remaining Percentage: 28 %
Date Time Interrogation Session: 20190509080017
HIGH POWER IMPEDANCE MEASURED VALUE: 89 Ohm
HIGH POWER IMPEDANCE MEASURED VALUE: 89 Ohm
Implantable Lead Implant Date: 20130912
Implantable Lead Implant Date: 20130912
Implantable Lead Location: 753858
Implantable Lead Location: 753859
Lead Channel Impedance Value: 510 Ohm
Lead Channel Pacing Threshold Amplitude: 0.625 V
Lead Channel Sensing Intrinsic Amplitude: 1.8 mV
Lead Channel Setting Pacing Amplitude: 4.125
Lead Channel Setting Pacing Pulse Width: 0.8 ms
MDC IDC LEAD IMPLANT DT: 20130912
MDC IDC LEAD LOCATION: 753860
MDC IDC MSMT BATTERY VOLTAGE: 2.86 V
MDC IDC MSMT LEADCHNL LV IMPEDANCE VALUE: 1400 Ohm
MDC IDC MSMT LEADCHNL LV PACING THRESHOLD AMPLITUDE: 3.125 V
MDC IDC MSMT LEADCHNL LV PACING THRESHOLD PULSEWIDTH: 0.8 ms
MDC IDC MSMT LEADCHNL RA IMPEDANCE VALUE: 490 Ohm
MDC IDC MSMT LEADCHNL RV PACING THRESHOLD PULSEWIDTH: 0.5 ms
MDC IDC MSMT LEADCHNL RV SENSING INTR AMPL: 11.7 mV
MDC IDC PG IMPLANT DT: 20130912
MDC IDC SET LEADCHNL RV PACING AMPLITUDE: 2 V
MDC IDC SET LEADCHNL RV PACING PULSEWIDTH: 0.5 ms
MDC IDC SET LEADCHNL RV SENSING SENSITIVITY: 0.5 mV
Pulse Gen Serial Number: 1075181

## 2018-05-16 LAB — BASIC METABOLIC PANEL
ANION GAP: 10 (ref 5–15)
BUN: 23 mg/dL — AB (ref 6–20)
CHLORIDE: 110 mmol/L (ref 101–111)
CO2: 23 mmol/L (ref 22–32)
Calcium: 9 mg/dL (ref 8.9–10.3)
Creatinine, Ser: 1.37 mg/dL — ABNORMAL HIGH (ref 0.61–1.24)
GFR calc Af Amer: 57 mL/min — ABNORMAL LOW (ref >60)
GFR, EST NON AFRICAN AMERICAN: 49 mL/min — AB (ref >60)
Glucose, Bld: 115 mg/dL — ABNORMAL HIGH (ref 65–99)
POTASSIUM: 5.1 mmol/L (ref 3.5–5.1)
SODIUM: 143 mmol/L (ref 135–145)

## 2018-05-16 LAB — MAGNESIUM: MAGNESIUM: 2.2 mg/dL (ref 1.7–2.4)

## 2018-05-16 LAB — BRAIN NATRIURETIC PEPTIDE: B NATRIURETIC PEPTIDE 5: 107.1 pg/mL — AB (ref 0.0–100.0)

## 2018-05-16 LAB — I-STAT TROPONIN, ED: Troponin i, poc: 0.01 ng/mL (ref 0.00–0.08)

## 2018-05-16 LAB — I-STAT CHEM 8, ED
BUN: 31 mg/dL — AB (ref 6–20)
CREATININE: 1.3 mg/dL — AB (ref 0.61–1.24)
Calcium, Ion: 1.09 mmol/L — ABNORMAL LOW (ref 1.15–1.40)
Chloride: 109 mmol/L (ref 101–111)
Glucose, Bld: 113 mg/dL — ABNORMAL HIGH (ref 65–99)
HEMATOCRIT: 53 % — AB (ref 39.0–52.0)
Hemoglobin: 18 g/dL — ABNORMAL HIGH (ref 13.0–17.0)
POTASSIUM: 4.9 mmol/L (ref 3.5–5.1)
SODIUM: 143 mmol/L (ref 135–145)
TCO2: 26 mmol/L (ref 22–32)

## 2018-05-16 LAB — MRSA PCR SCREENING: MRSA by PCR: NEGATIVE

## 2018-05-16 MED ORDER — IPRATROPIUM-ALBUTEROL 0.5-2.5 (3) MG/3ML IN SOLN: 3.0000 mL | RESPIRATORY_TRACT | Status: DC | PRN

## 2018-05-16 MED ORDER — CITALOPRAM HYDROBROMIDE 10 MG PO TABS: 10.0000 mg | ORAL_TABLET | Freq: Every day | ORAL | Status: DC

## 2018-05-16 MED ORDER — MAGNESIUM SULFATE IN D5W 1-5 GM/100ML-% IV SOLN
1.0000 g | Freq: Once | INTRAVENOUS | Status: AC
  Administered 2018-05-16: 1 g via INTRAVENOUS
  Filled 2018-05-16: qty 100

## 2018-05-16 MED ORDER — ALBUTEROL SULFATE (2.5 MG/3ML) 0.083% IN NEBU
5.0000 mg | INHALATION_SOLUTION | Freq: Once | RESPIRATORY_TRACT | Status: AC
  Administered 2018-05-16: 5 mg via RESPIRATORY_TRACT

## 2018-05-16 MED ORDER — ARFORMOTEROL TARTRATE 15 MCG/2ML IN NEBU
15.0000 ug | INHALATION_SOLUTION | Freq: Two times a day (BID) | RESPIRATORY_TRACT | Status: DC
  Administered 2018-05-16 – 2018-05-18 (×5): 15 ug via RESPIRATORY_TRACT
  Filled 2018-05-16 (×5): qty 2

## 2018-05-16 MED ORDER — BISOPROLOL FUMARATE 5 MG PO TABS
2.5000 mg | ORAL_TABLET | Freq: Every day | ORAL | Status: DC
  Administered 2018-05-16 – 2018-05-18 (×3): 2.5 mg via ORAL
  Filled 2018-05-16: qty 0.5
  Filled 2018-05-16 (×2): qty 1

## 2018-05-16 MED ORDER — LOSARTAN POTASSIUM 25 MG PO TABS
25.0000 mg | ORAL_TABLET | Freq: Every day | ORAL | Status: DC
  Administered 2018-05-16: 25 mg via ORAL
  Filled 2018-05-16: qty 1

## 2018-05-16 MED ORDER — ALBUTEROL SULFATE (2.5 MG/3ML) 0.083% IN NEBU
INHALATION_SOLUTION | RESPIRATORY_TRACT | Status: AC
  Filled 2018-05-16: qty 6

## 2018-05-16 MED ORDER — SPIRONOLACTONE 25 MG PO TABS
25.0000 mg | ORAL_TABLET | Freq: Every day | ORAL | Status: DC
  Administered 2018-05-16: 25 mg via ORAL
  Filled 2018-05-16: qty 1

## 2018-05-16 MED ORDER — CITALOPRAM HYDROBROMIDE 20 MG PO TABS
20.0000 mg | ORAL_TABLET | Freq: Every day | ORAL | Status: DC
  Administered 2018-05-16 – 2018-05-18 (×3): 20 mg via ORAL
  Filled 2018-05-16: qty 2
  Filled 2018-05-16 (×2): qty 1

## 2018-05-16 MED ORDER — METHYLPREDNISOLONE SODIUM SUCC 125 MG IJ SOLR
60.0000 mg | INTRAMUSCULAR | Status: DC
  Administered 2018-05-16 – 2018-05-18 (×3): 60 mg via INTRAVENOUS
  Filled 2018-05-16 (×3): qty 2

## 2018-05-16 MED ORDER — SODIUM CHLORIDE 0.9 % IV SOLN
1.0000 g | Freq: Once | INTRAVENOUS | Status: AC
  Administered 2018-05-16: 1 g via INTRAVENOUS
  Filled 2018-05-16: qty 10

## 2018-05-16 MED ORDER — AMLODIPINE BESYLATE 5 MG PO TABS
5.0000 mg | ORAL_TABLET | Freq: Every day | ORAL | Status: DC
  Administered 2018-05-16 – 2018-05-18 (×3): 5 mg via ORAL
  Filled 2018-05-16 (×3): qty 1

## 2018-05-16 MED ORDER — IPRATROPIUM-ALBUTEROL 0.5-2.5 (3) MG/3ML IN SOLN
3.0000 mL | Freq: Four times a day (QID) | RESPIRATORY_TRACT | Status: DC
  Administered 2018-05-16 – 2018-05-18 (×10): 3 mL via RESPIRATORY_TRACT
  Filled 2018-05-16 (×10): qty 3

## 2018-05-16 MED ORDER — ASPIRIN EC 325 MG PO TBEC
325.0000 mg | DELAYED_RELEASE_TABLET | Freq: Every day | ORAL | Status: DC
  Administered 2018-05-16 – 2018-05-18 (×3): 325 mg via ORAL
  Filled 2018-05-16 (×3): qty 1

## 2018-05-16 MED ORDER — NITROGLYCERIN 0.4 MG SL SUBL: 0.4000 mg | SUBLINGUAL_TABLET | SUBLINGUAL | Status: DC | PRN

## 2018-05-16 MED ORDER — DOXYCYCLINE HYCLATE 100 MG PO TABS
100.0000 mg | ORAL_TABLET | Freq: Two times a day (BID) | ORAL | Status: DC
  Administered 2018-05-16 – 2018-05-18 (×5): 100 mg via ORAL
  Filled 2018-05-16 (×5): qty 1

## 2018-05-16 MED ORDER — FUROSEMIDE 40 MG PO TABS
40.0000 mg | ORAL_TABLET | Freq: Every day | ORAL | Status: DC
  Administered 2018-05-16: 40 mg via ORAL
  Filled 2018-05-16: qty 2

## 2018-05-16 MED ORDER — PRAVASTATIN SODIUM 80 MG PO TABS
80.0000 mg | ORAL_TABLET | Freq: Every day | ORAL | Status: DC
  Administered 2018-05-16 – 2018-05-17 (×2): 80 mg via ORAL
  Filled 2018-05-16 (×3): qty 1

## 2018-05-16 MED ORDER — HYDRALAZINE HCL 50 MG PO TABS
50.0000 mg | ORAL_TABLET | Freq: Three times a day (TID) | ORAL | Status: DC
  Administered 2018-05-16 – 2018-05-18 (×7): 50 mg via ORAL
  Filled 2018-05-16 (×7): qty 1
  Filled 2018-05-16: qty 2

## 2018-05-16 MED ORDER — PANTOPRAZOLE SODIUM 40 MG PO TBEC
40.0000 mg | DELAYED_RELEASE_TABLET | Freq: Two times a day (BID) | ORAL | Status: DC
  Administered 2018-05-16 – 2018-05-18 (×5): 40 mg via ORAL
  Filled 2018-05-16 (×5): qty 1

## 2018-05-16 MED ORDER — HYDRALAZINE HCL 20 MG/ML IJ SOLN: 10.0000 mg | INTRAMUSCULAR | Status: DC | PRN

## 2018-05-16 MED ORDER — METHYLPREDNISOLONE SODIUM SUCC 125 MG IJ SOLR: 60.0000 mg | Freq: Three times a day (TID) | INTRAMUSCULAR | Status: DC

## 2018-05-16 MED ORDER — ENOXAPARIN SODIUM 40 MG/0.4ML ~~LOC~~ SOLN
40.0000 mg | SUBCUTANEOUS | Status: DC
  Administered 2018-05-16 – 2018-05-17 (×2): 40 mg via SUBCUTANEOUS
  Filled 2018-05-16 (×3): qty 0.4

## 2018-05-16 MED ORDER — CEFTRIAXONE SODIUM 1 G IJ SOLR: 1.0000 g | INTRAMUSCULAR | Status: DC

## 2018-05-16 MED ORDER — BUDESONIDE 0.5 MG/2ML IN SUSP
0.5000 mg | Freq: Two times a day (BID) | RESPIRATORY_TRACT | Status: DC
  Administered 2018-05-16 – 2018-05-18 (×5): 0.5 mg via RESPIRATORY_TRACT
  Filled 2018-05-16 (×5): qty 2

## 2018-05-16 MED ORDER — ALBUTEROL SULFATE (2.5 MG/3ML) 0.083% IN NEBU
2.5000 mg | INHALATION_SOLUTION | RESPIRATORY_TRACT | Status: DC | PRN
  Administered 2018-05-16: 2.5 mg via RESPIRATORY_TRACT
  Filled 2018-05-16 (×2): qty 3

## 2018-05-16 MED ORDER — ALBUTEROL (5 MG/ML) CONTINUOUS INHALATION SOLN
10.0000 mg/h | INHALATION_SOLUTION | RESPIRATORY_TRACT | Status: DC
  Administered 2018-05-16: 10 mg/h via RESPIRATORY_TRACT
  Filled 2018-05-16: qty 20

## 2018-05-16 MED ORDER — ONDANSETRON HCL 4 MG PO TABS: 4.0000 mg | ORAL_TABLET | Freq: Four times a day (QID) | ORAL | Status: DC | PRN

## 2018-05-16 MED ORDER — CITALOPRAM HYDROBROMIDE 10 MG PO TABS: 20.0000 mg | ORAL_TABLET | Freq: Every day | ORAL | Status: DC

## 2018-05-16 MED ORDER — ACETAMINOPHEN 650 MG RE SUPP: 650.0000 mg | Freq: Four times a day (QID) | RECTAL | Status: DC | PRN

## 2018-05-16 MED ORDER — GUAIFENESIN ER 600 MG PO TB12
600.0000 mg | ORAL_TABLET | Freq: Two times a day (BID) | ORAL | Status: DC
  Administered 2018-05-16 – 2018-05-18 (×5): 600 mg via ORAL
  Filled 2018-05-16 (×5): qty 1

## 2018-05-16 MED ORDER — ONDANSETRON HCL 4 MG/2ML IJ SOLN: 4.0000 mg | Freq: Four times a day (QID) | INTRAMUSCULAR | Status: DC | PRN

## 2018-05-16 MED ORDER — POTASSIUM CHLORIDE CRYS ER 20 MEQ PO TBCR
20.0000 meq | EXTENDED_RELEASE_TABLET | Freq: Three times a day (TID) | ORAL | Status: DC
  Administered 2018-05-16 (×3): 20 meq via ORAL
  Filled 2018-05-16 (×3): qty 1

## 2018-05-16 MED ORDER — METHYLPREDNISOLONE SODIUM SUCC 125 MG IJ SOLR
60.0000 mg | Freq: Three times a day (TID) | INTRAMUSCULAR | Status: DC
  Filled 2018-05-16: qty 2

## 2018-05-16 MED ORDER — ALBUTEROL (5 MG/ML) CONTINUOUS INHALATION SOLN
10.0000 mg/h | INHALATION_SOLUTION | Freq: Once | RESPIRATORY_TRACT | Status: AC
  Administered 2018-05-16: 10 mg/h via RESPIRATORY_TRACT

## 2018-05-16 MED ORDER — ACETAMINOPHEN 325 MG PO TABS: 650.0000 mg | ORAL_TABLET | Freq: Four times a day (QID) | ORAL | Status: DC | PRN

## 2018-05-16 MED ORDER — NIACIN ER (ANTIHYPERLIPIDEMIC) 500 MG PO TBCR
500.0000 mg | EXTENDED_RELEASE_TABLET | Freq: Two times a day (BID) | ORAL | Status: DC
  Administered 2018-05-16 – 2018-05-18 (×5): 500 mg via ORAL
  Filled 2018-05-16 (×6): qty 1

## 2018-05-16 NOTE — ED Notes (Signed)
RT aware of need for CAT  

## 2018-05-16 NOTE — ED Provider Notes (Addendum)
[facility] EMERGENCY DEPARTMENT Provider Note   CSN: [id number] Arrival date & time: 05/16/18  0147     History   Chief Complaint Chief Complaint  Patient presents with  . COPD    Wheezing    HPI [name] [name] is a 77 y.o. male.  The history is provided by the patient.  COPD  This is a chronic problem. The current episode started more than 1 week ago. The problem occurs constantly. The problem has been gradually worsening. Associated symptoms include shortness of breath. Pertinent negatives include no chest pain, no abdominal pain and no headaches. Nothing aggravates the symptoms. Nothing relieves the symptoms. He has tried nothing for the symptoms. The treatment provided no relief.    Past Medical History:  Diagnosis Date  . Aortic dissection (HCC)    H/O focal aortic dissection  . CAD (coronary artery disease)    a. s/p CABG in 1998 with LIMA-LAD, SVG-D1, SVG-OM2-OM3, and SVG-RCA b. cath 10/07:  LM ok, LAD occluded, AV CFX 95%, pRCA occluded; L-LAD, S-OM2/OM3, S-PDA ok c. 2013: cath showing patent grafts  . Cerebrovascular disease    a. s/p prior Left CEA;  followed by Dr. [name]  . CHF (congestive heart failure) (HCC)    a. s/p STJ CRTD  . CKD (chronic kidney disease)   . COPD (chronic obstructive pulmonary disease) (HCC)   . Depression   . DVT (deep venous thrombosis) (HCC)   . History of stomach ulcers   . Hyperlipidemia   . Hypertension   . Iliac artery aneurysm, left (HCC)    followed by Dr. [name]  . Ischemic cardiomyopathy    a. s/p St. Jude CRTD placement in 2013  . LBBB (left bundle branch block) 2006  . Myocardial infarction (HCC) 1997  . Nephrolithiasis   . PAF (paroxysmal atrial fibrillation) (HCC) 2006   a. on amio and pradaxa. b. s/p AVN ablation  . Stroke [facility]) 12/2015 "several mini"   a. on coumadin --> switched to pradaxa b. s/p Watchman in 05/2016    Patient Active Problem List   Diagnosis Date Noted  . COPD exacerbation  (HCC) 05/16/2018  . Stroke (HCC)   . Nephrolithiasis   . Ischemic cardiomyopathy   . Iliac artery aneurysm, left (HCC)   . Hypertension   . Hyperlipidemia   . History of stomach ulcers   . DVT (deep venous thrombosis) (HCC)   . CKD (chronic kidney disease)   . CHF (congestive heart failure) (HCC)   . CAD (coronary artery disease)   . Aortic dissection (HCC)   . Tobacco use disorder 05/29/2017  . Acute on chronic combined systolic and diastolic CHF (congestive heart failure) (HCC) 05/25/2017  . Hypokalemia 05/25/2017  . History of aortic aneurysm repair 04/10/2017  . Tachycardia-bradycardia syndrome (HCC)   . Cerebrovascular disease 05/04/2016  . NSVT (nonsustained ventricular tachycardia) (HCC) 03/15/2016  . Non compliance with medical treatment 03/15/2016  . AKI (acute kidney injury) (HCC) 03/12/2016  . GI bleed-Feb 2017 (being cnsidered for Watchman device) 01/21/2016  . Abdominal aortic aneurysm without rupture (HCC) 01/03/2016  . Carpal tunnel syndrome 09/09/2015  . CKD (chronic kidney disease), stage III (HCC) 03/04/2015  . HLD (hyperlipidemia) 03/01/2015  . BPPV (benign paroxysmal positional vertigo)   . Depression   . History of stroke-2005 12/23/2014  . Automatic implantable cardioverter-defibrillator in situ 08/26/2012  . Dizziness 06/24/2012  . Claudication (HCC) 04/15/2012  . COPD (chronic obstructive pulmonary disease) (HCC) 07/24/2011  . Long term  current use of anticoagulant-Pradaxa 02/21/2011  . CAD- CABG '98- cath 2007 and Aug 2013- Med Rx 10/12/2009  . ILIAC ARTERY ANEURYSM 10/12/2009  . Cardiomyopathy, ischemic-EF 25%  10/18/2008  . 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 Permanent atrial fibrillation (HCC) 10/18/2008  . PAF (paroxysmal atrial fibrillation) (HCC) 12/11/2004  . LBBB (left bundle branch block) 12/11/2004  . Myocardial infarction [facility]) 12/12/1995    Past Surgical History:  Procedure Laterality Date  . ABDOMINAL AORTIC ANEURYSM REPAIR N/A  01/03/2016   Procedure: ANEURYSM ABDOMINAL AORTA, BILATERAL COMMON ILIAC REPAIR;  Surgeon: [name], MD;  Location: [facility] OR;  Service: Vascular;  Laterality: N/A;  . ABDOMINAL AORTIC ANEURYSM REPAIR  01/03/2016  . APPENDECTOMY  09/11/11  . BACK SURGERY    . BI-VENTRICULAR IMPLANTABLE CARDIOVERTER DEFIBRILLATOR N/A 08/22/2012   SJM Quadra Assura BiV ICD implanted by Dr [name]  . CARDIAC CATHETERIZATION  1998; 2013  . CARDIOVERSION N/A 07/26/2016   Procedure: CARDIOVERSION;  Surgeon: Lars Masson, MD;  Location: [facility] ENDOSCOPY;  Service: Cardiovascular;  Laterality: N/A;  . CAROTID ENDARTERECTOMY Left July  2005  . CORONARY ARTERY BYPASS GRAFT  1998   CABG X5  . CYSTOSCOPY W/ STONE MANIPULATION  X 2  . ELECTROPHYSIOLOGIC STUDY N/A 08/17/2016   AVN ablation by Dr [name]  . ESOPHAGOGASTRODUODENOSCOPY N/A 02/28/2016   Procedure: ESOPHAGOGASTRODUODENOSCOPY (EGD);  Surgeon: [name], MD;  Location: [facility] ENDOSCOPY;  Service: Endoscopy;  Laterality: N/A;  . LEFT ATRIAL APPENDAGE OCCLUSION N/A 06/01/2016   Watchman device implanted by Dr [name]  . POSTERIOR FUSION LUMBAR SPINE  Sep 27, 2014   L4-L5  . PR VEIN BYPASS GRAFT,AORTO-FEM-POP  01/03/2016  . RIGHT HEART CATH N/A 05/28/2017   Procedure: Right Heart Cath;  Surgeon: Dolores Patty, MD;  Location: [facility] INVASIVE CV LAB;  Service: Cardiovascular;  Laterality: N/A;  . TEE WITHOUT CARDIOVERSION N/A 07/26/2016   Procedure: TRANSESOPHAGEAL ECHOCARDIOGRAM (TEE);  Surgeon: Lars Masson, MD;  Location: [facility] ENDOSCOPY;  Service: Cardiovascular;  Laterality: N/A;        Home Medications    Prior to Admission medications   Medication Sig Start Date End Date Taking? Authorizing Provider  albuterol (PROVENTIL HFA;VENTOLIN HFA) 108 (90 Base) MCG/ACT inhaler Inhale 2 puffs into the lungs every 6 (six) hours as needed for wheezing or shortness of breath. 09/25/17   Leslye Peer, MD  albuterol (PROVENTIL) (2.5 MG/3ML) 0.083% nebulizer  solution USE ONE VIAL IN NEBULIZER EVERY 4 HOURS AS NEEDED FOR WHEEZING AND FOR SHORTNESS OF BREATH 04/13/17   Leslye Peer, MD  amLODipine (NORVASC) 10 MG tablet Take 0.5 tablets (5 mg total) by mouth daily. 01/21/18   Lewayne Bunting, MD  aspirin EC 325 MG EC tablet Take 1 tablet (325 mg total) by mouth daily. 08/09/16   Gypsy Balsam K, NP  bisoprolol (ZEBETA) 5 MG tablet Take 0.5 tablets (2.5 mg total) by mouth daily. 01/23/18   Lewayne Bunting, MD  budesonide-formoterol (SYMBICORT) 160-4.5 MCG/ACT inhaler Inhale 2 puffs into the lungs 2 (two) times daily. 09/17/17   Leslye Peer, MD  citalopram (CELEXA) 10 MG tablet Take 10 mg by mouth daily. Reported on 06/06/2016    [provider]  furosemide (LASIX) 20 MG tablet TAKE 2 TABLETS EVERY DAY 04/02/18   Lewayne Bunting, MD  Investigational - Study Medication Take 1 tablet by mouth 2 (two) times daily. Study name: Galactic Heart Failure Research Study Additional study details: Omecamtiv Mecarbil or Placebo 08/28/17  Laurey Morale, MD  KLOR-CON M20 20 MEQ tablet TAKE 2 TABLETS THREE TIMES DAILY. PLEASE SCHEDULE APPOINTMENT FOR REFILLS. 01/18/18   Lewayne Bunting, MD  Multiple Vitamin (MULITIVITAMIN WITH MINERALS) TABS Take 1 tablet by mouth daily.    [provider]  niacin (NIASPAN) 500 MG CR tablet Take 1,000 mg by mouth 2 (two) times daily.     [provider]  nitroGLYCERIN (NITROSTAT) 0.4 MG SL tablet Place 1 tablet (0.4 mg total) under the tongue every 5 (five) minutes as needed for chest pain. X 3 doses 01/21/18   Lewayne Bunting, MD  pravastatin (PRAVACHOL) 80 MG tablet TAKE 1 TABLET EVERY DAY 04/02/18   Lewayne Bunting, MD  sacubitril-valsartan (ENTRESTO) 49-51 MG Take 1 tablet by mouth 2 (two) times daily. 12/13/17   Azalee Course, PA  spironolactone (ALDACTONE) 25 MG tablet TAKE 1 TABLET EVERY DAY 04/02/18   Lewayne Bunting, MD  tiotropium (SPIRIVA HANDIHALER) 18 MCG inhalation capsule Place 1 capsule (18  mcg total) into inhaler and inhale daily. 12/27/17 12/27/18  Leslye Peer, MD    Family History Family History  Problem Relation Age of Onset  . Stroke Maternal Uncle   . Stroke Maternal Uncle   . Heart attack Neg Hx   . Hypertension Neg Hx     Social History Social History   Tobacco Use  . Smoking status: Former Smoker    Packs/day: 0.50    Years: 48.00    Pack years: 24.00    Types: Cigarettes    Last attempt to quit: 05/24/2017    Years since quitting: 0.9  . Smokeless tobacco: Never Used  . Tobacco comment: quit 05/25/17  Substance Use Topics  . Alcohol use: Yes    Alcohol/week: 0.0 oz    Comment: 06/01/2016 "haven't had a drink in years"  . Drug use: No     Allergies   Buprenorphine hcl; Morphine and related; and Other   Review of Systems Review of Systems  Constitutional: Negative for diaphoresis and fever.  Respiratory: Positive for cough, shortness of breath and wheezing.   Cardiovascular: Negative for chest pain.  Gastrointestinal: Negative for abdominal pain.  Neurological: Negative for headaches.  All other systems reviewed and are negative.    Physical Exam Updated Vital Signs BP 107/64   Pulse 70   Temp (!) 97.3 F (36.3 C) (Temporal)   Resp 17   SpO2 96%   Physical Exam  Constitutional: He is oriented to person, place, and time. He appears well-developed and well-nourished. No distress.  HENT:  Head: Normocephalic and atraumatic.  Mouth/Throat: No oropharyngeal exudate.  Eyes: Pupils are equal, round, and reactive to light. Conjunctivae are normal.  Neck: Normal range of motion. Neck supple. No tracheal deviation present.  Cardiovascular: Normal rate, regular rhythm, normal heart sounds and intact distal pulses.  Pulmonary/Chest: Tachypnea noted. He is in respiratory distress. He has decreased breath sounds. He has wheezes.  Abdominal: Soft. Bowel sounds are normal. He exhibits no mass. There is no tenderness. There is no rebound and no  guarding.  Musculoskeletal: Normal range of motion.  Neurological: He is alert and oriented to person, place, and time. He displays normal reflexes.  Skin: Skin is warm and dry. Capillary refill takes less than 2 seconds.  Psychiatric: He has a normal mood and affect.     ED Treatments / Results  Labs (all labs ordered are listed, but only abnormal results are displayed) Results for orders placed or  performed during the hospital encounter of 05/16/18  Basic metabolic panel  Result Value Ref Range   Sodium 143 135 - 145 mmol/L   Potassium 5.1 3.5 - 5.1 mmol/L   Chloride 110 101 - 111 mmol/L   CO2 23 22 - 32 mmol/L   Glucose, Bld 115 (H) 65 - 99 mg/dL   BUN 23 (H) 6 - 20 mg/dL   Creatinine, Ser 1.61 (H) 0.61 - 1.24 mg/dL   Calcium 9.0 8.9 - 09.6 mg/dL   GFR calc non Af Amer 49 (L) >60 mL/min   GFR calc Af Amer 57 (L) >60 mL/min   Anion gap 10 5 - 15  CBC with Differential/Platelet  Result Value Ref Range   WBC 9.0 4.0 - 10.5 K/uL   RBC 5.64 4.22 - 5.81 MIL/uL   Hemoglobin 17.5 (H) 13.0 - 17.0 g/dL   HCT 04.5 (H) 40.9 - 81.1 %   MCV 97.7 78.0 - 100.0 fL   MCH 31.0 26.0 - 34.0 pg   MCHC 31.8 30.0 - 36.0 g/dL   RDW 91.4 78.2 - 95.6 %   Platelets 174 150 - 400 K/uL   Neutrophils Relative % 57 %   Neutro Abs 5.2 1.7 - 7.7 K/uL   Lymphocytes Relative 27 %   Lymphs Abs 2.4 0.7 - 4.0 K/uL   Monocytes Relative 7 %   Monocytes Absolute 0.6 0.1 - 1.0 K/uL   Eosinophils Relative 7 %   Eosinophils Absolute 0.6 0.0 - 0.7 K/uL   Basophils Relative 1 %   Basophils Absolute 0.1 0.0 - 0.1 K/uL   Immature Granulocytes 1 %   Abs Immature Granulocytes 0.1 0.0 - 0.1 K/uL  Brain natriuretic peptide  Result Value Ref Range   B Natriuretic Peptide 107.1 (H) 0.0 - 100.0 pg/mL  Magnesium  Result Value Ref Range   Magnesium 2.2 1.7 - 2.4 mg/dL  I-stat chem 8, ed  Result Value Ref Range   Sodium 143 135 - 145 mmol/L   Potassium 4.9 3.5 - 5.1 mmol/L   Chloride 109 101 - 111 mmol/L   BUN 31  (H) 6 - 20 mg/dL   Creatinine, Ser 2.13 (H) 0.61 - 1.24 mg/dL   Glucose, Bld 086 (H) 65 - 99 mg/dL   Calcium, Ion 5.78 (L) 1.15 - 1.40 mmol/L   TCO2 26 22 - 32 mmol/L   Hemoglobin 18.0 (H) 13.0 - 17.0 g/dL   HCT 46.9 (H) 62.9 - 52.8 %  I-stat troponin, ED  Result Value Ref Range   Troponin i, poc 0.01 0.00 - 0.08 ng/mL   Comment 3           Dg Chest 2 View  Result Date: 05/16/2018 CLINICAL DATA:  Acute onset of shortness of breath. EXAM: CHEST - 2 VIEW COMPARISON:  Chest radiograph performed 05/24/2017 FINDINGS: The lungs are well-aerated. Minimal bibasilar atelectasis is noted. There is no evidence of focal opacification, pleural effusion or pneumothorax. The heart is borderline enlarged. The patient is status post median sternotomy, with evidence of prior CABG. A pacemaker/AICD is noted at the left chest wall, with leads ending at the right atrium and right ventricle. No acute osseous abnormalities are seen. IMPRESSION: Minimal bibasilar atelectasis noted; lungs otherwise clear. Borderline cardiomegaly. Electronically Signed   By: Roanna Raider M.D.   On: 05/16/2018 03:26    EKG EKG Interpretation  Date/Time:  Thursday May 16 2018 01:51:18 EDT Ventricular Rate:  76 PR Interval:    QRS Duration: 157 QT  Interval:  472 QTC Calculation: 531 R Axis:   -95 Text Interpretation:  Accelerated junctional rhythm Nonspecific IVCD with LAD Inferior infarct, old Anterior infarct, old Confirmed by Nicanor Alcon, Oreoluwa Gilmer ([id number]) on 05/16/2018 5:23:34 AM   Radiology Dg Chest 2 View  Result Date: 05/16/2018 CLINICAL DATA:  Acute onset of shortness of breath. EXAM: CHEST - 2 VIEW COMPARISON:  Chest radiograph performed 05/24/2017 FINDINGS: The lungs are well-aerated. Minimal bibasilar atelectasis is noted. There is no evidence of focal opacification, pleural effusion or pneumothorax. The heart is borderline enlarged. The patient is status post median sternotomy, with evidence of prior CABG. A pacemaker/AICD is  noted at the left chest wall, with leads ending at the right atrium and right ventricle. No acute osseous abnormalities are seen. IMPRESSION: Minimal bibasilar atelectasis noted; lungs otherwise clear. Borderline cardiomegaly. Electronically Signed   By: Roanna Raider M.D.   On: 05/16/2018 03:26    Procedures Procedures (including critical care time)  Medications Ordered in ED Medications  albuterol (PROVENTIL,VENTOLIN) solution continuous neb (10 mg/hr Nebulization New Bag/Given 05/16/18 0347)  albuterol (PROVENTIL,VENTOLIN) solution continuous neb (has no administration in time range)  magnesium sulfate IVPB 1 g 100 mL (has no administration in time range)  amLODipine (NORVASC) tablet 5 mg (has no administration in time range)  bisoprolol (ZEBETA) tablet 2.5 mg (has no administration in time range)  nitroGLYCERIN (NITROSTAT) SL tablet 0.4 mg (has no administration in time range)  arformoterol (BROVANA) nebulizer solution 15 mcg (has no administration in time range)  budesonide (PULMICORT) nebulizer solution 0.5 mg (has no administration in time range)  aspirin EC tablet 325 mg (has no administration in time range)  hydrALAZINE (APRESOLINE) injection 10 mg (has no administration in time range)  hydrALAZINE (APRESOLINE) tablet 50 mg (has no administration in time range)  losartan (COZAAR) tablet 25 mg (has no administration in time range)  spironolactone (ALDACTONE) tablet 25 mg (has no administration in time range)  enoxaparin (LOVENOX) injection 40 mg (has no administration in time range)  methylPREDNISolone sodium succinate (SOLU-MEDROL) 125 mg/2 mL injection 60 mg (has no administration in time range)  ondansetron (ZOFRAN) tablet 4 mg (has no administration in time range)    Or  ondansetron (ZOFRAN) injection 4 mg (has no administration in time range)  acetaminophen (TYLENOL) tablet 650 mg (has no administration in time range)    Or  acetaminophen (TYLENOL) suppository 650 mg (has no  administration in time range)  guaiFENesin (MUCINEX) 12 hr tablet 600 mg (has no administration in time range)  ipratropium-albuterol (DUONEB) 0.5-2.5 (3) MG/3ML nebulizer solution 3 mL (has no administration in time range)  citalopram (CELEXA) tablet 20 mg (has no administration in time range)  albuterol (PROVENTIL) (2.5 MG/3ML) 0.083% nebulizer solution 5 mg (5 mg Nebulization Given 05/16/18 0204)  cefTRIAXone (ROCEPHIN) 1 g in sodium chloride 0.9 % 100 mL IVPB (0 g Intravenous Stopped 05/16/18 0530)     MDM Reviewed: previous chart, nursing note and vitals Interpretation: x-ray, ECG and labs (no PNA on cxr negative troponin) Total time providing critical care: 75-105 minutes. This excludes time spent performing separately reportable procedures and services. Consults: admitting MD  CRITICAL CARE Performed by: Jasmine Awe Total critical care time: 75 minutes Critical care time was exclusive of separately billable procedures and treating other patients. Critical care was necessary to treat or prevent imminent or life-threatening deterioration. Critical care was time spent personally by me on the following activities: development of treatment plan with patient and/or surrogate as well as nursing,  discussions with consultants, evaluation of patient's response to treatment, examination of patient, obtaining history from patient or surrogate, ordering and performing treatments and interventions, ordering and review of laboratory studies, ordering and review of radiographic studies, pulse oximetry and re-evaluation of patient's condition.  Final Clinical Impressions(s) / ED Diagnoses   Final diagnoses:  COPD exacerbation (HCC)    Admit to step down   Zarya Lasseigne, MD 05/16/18 5621    Nicanor Alcon, Fernande Treiber, MD 05/16/18 3086

## 2018-05-16 NOTE — ED Triage Notes (Signed)
Patient arrived with EMS from home reports worsening SOB with wheezing and chest congestion this evening , received Albuterol 10 mg and 1 Duoneb treatment/Solumedrol 125 mg IV  by EMS , )O2 sat = 94% room air at arrival . History of COPD /CHF/CAD with pacemaker/defibrillator . Denies chest pain , his cardiologist is Dr. [name].

## 2018-05-16 NOTE — H&P (Addendum)
History and Physical    [name] [name] RUE:[id number] DOB: [birth year] DOA: 05/16/2018  Referring MD/NP/PA: Dr. Cy Blamer PCP: [name], MD  Patient coming from: Home via EMS  Chief Complaint: Shortness of breath I have personally briefly reviewed patient's old medical records in [place] [facility]   HPI: [name] [name] is a 77 y.o. male with medical history significant of HTN, HLD, PAF, CAD s/p CABG, systolic CHF last EF 20 to 25% with grade 2 diastolic dysfunction, s/p PM/defibrillator, CVA, AAA, CKD stage III, and DVT; who presents with complaints of worsening shortness of breath.  Patient reports that he had [place].  Over the last day patient reported having chills, subjective fever, wheezing, and productive cough with yellow sputum production.  Overnight patient reports being significantly short of breath and utilizing albuterol inhaler provided no relief.  Denies having any significant chest pain, recent sick contacts, nausea, vomiting, leg swelling, or diarrhea symptoms.  Patient reportedly still smokes from time to time.  At baseline patient reports not requiring oxygen at home.  Also makes note that he is no longer on anticoagulants due to a history of GI bleed and abdominal aortic aneurysm  En route with EMS patient was given DuoNeb breathing treatment and 125 mg of Solu-Medrol IV.  O2 saturations were noted to be 94% on room air.  ED Course: Upon admission to the emergency department patient was seen to be afebrile, pulse 70-82, respirations 17-29, blood pressures maintained, and O2 saturation 93 to 99% on 4 L currently.  Labs revealed WBC 9, hemoglobin 17.5, BUN 23, creatinine 1.37, BNP 107.1, troponin 0.1 patient was given 2 continuous albuterol neb treatments, ceftriaxone, and 1 g of magnesium sulfate.  TRH called to admit for COPD exacerbation.  Review of Systems  Constitutional:  Positive for chills, fever and malaise/fatigue.  HENT: Positive for congestion. Negative for ear discharge and nosebleeds.   Eyes: Negative for pain and discharge.  Respiratory: Positive for cough, sputum production, shortness of breath and wheezing.   Cardiovascular: Negative for chest pain and leg swelling.  Gastrointestinal: Negative for abdominal pain, blood in stool, nausea and vomiting.  Genitourinary: Negative for dysuria and frequency.  Musculoskeletal: Negative for falls and neck pain.  Skin: Negative for itching.  Neurological: Negative for focal weakness and loss of consciousness.  Psychiatric/Behavioral: Negative for hallucinations and substance abuse.    Past Medical History:  Diagnosis Date  . Aortic dissection (HCC)    H/O focal aortic dissection  . CAD (coronary artery disease)    a. s/p CABG in 1998 with LIMA-LAD, SVG-D1, SVG-OM2-OM3, and SVG-RCA b. cath 10/07:  LM ok, LAD occluded, AV CFX 95%, pRCA occluded; L-LAD, S-OM2/OM3, S-PDA ok c. 2013: cath showing patent grafts  . Cerebrovascular disease    a. s/p prior Left CEA;  followed by Dr. [name]  . CHF (congestive heart failure) (HCC)    a. s/p STJ CRTD  . CKD (chronic kidney disease)   . COPD (chronic obstructive pulmonary disease) (HCC)   . Depression   . DVT (deep venous thrombosis) (HCC)   . History of stomach ulcers   . Hyperlipidemia   . Hypertension   . Iliac artery aneurysm, left (HCC)    followed by Dr. [name]  . Ischemic cardiomyopathy    a. s/p St. Jude CRTD placement in 2013  . LBBB (left bundle branch block) 2006  . Myocardial infarction (HCC) 1997  .  Nephrolithiasis   . PAF (paroxysmal atrial fibrillation) (HCC) 2006   a. on amio and pradaxa. b. s/p AVN ablation  . Stroke [facility]) 12/2015 "several mini"   a. on coumadin --> switched to pradaxa b. s/p Watchman in 05/2016    Past Surgical History:  Procedure Laterality Date  . ABDOMINAL AORTIC ANEURYSM REPAIR N/A 01/03/2016   Procedure: ANEURYSM  ABDOMINAL AORTA, BILATERAL COMMON ILIAC REPAIR;  Surgeon: [name], MD;  Location: [facility] OR;  Service: Vascular;  Laterality: N/A;  . ABDOMINAL AORTIC ANEURYSM REPAIR  01/03/2016  . APPENDECTOMY  09/11/11  . BACK SURGERY    . BI-VENTRICULAR IMPLANTABLE CARDIOVERTER DEFIBRILLATOR N/A 08/22/2012   SJM Quadra Assura BiV ICD implanted by Dr [name]  . CARDIAC CATHETERIZATION  1998; 2013  . CARDIOVERSION N/A 07/26/2016   Procedure: CARDIOVERSION;  Surgeon: Lars Masson, MD;  Location: [facility] ENDOSCOPY;  Service: Cardiovascular;  Laterality: N/A;  . CAROTID ENDARTERECTOMY Left July  2005  . CORONARY ARTERY BYPASS GRAFT  1998   CABG X5  . CYSTOSCOPY W/ STONE MANIPULATION  X 2  . ELECTROPHYSIOLOGIC STUDY N/A 08/17/2016   AVN ablation by Dr [name]  . ESOPHAGOGASTRODUODENOSCOPY N/A 02/28/2016   Procedure: ESOPHAGOGASTRODUODENOSCOPY (EGD);  Surgeon: [name], MD;  Location: [facility] ENDOSCOPY;  Service: Endoscopy;  Laterality: N/A;  . LEFT ATRIAL APPENDAGE OCCLUSION N/A 06/01/2016   Watchman device implanted by Dr [name]  . POSTERIOR FUSION LUMBAR SPINE  Sep 27, 2014   L4-L5  . PR VEIN BYPASS GRAFT,AORTO-FEM-POP  01/03/2016  . RIGHT HEART CATH N/A 05/28/2017   Procedure: Right Heart Cath;  Surgeon: Dolores Patty, MD;  Location: [facility] INVASIVE CV LAB;  Service: Cardiovascular;  Laterality: N/A;  . TEE WITHOUT CARDIOVERSION N/A 07/26/2016   Procedure: TRANSESOPHAGEAL ECHOCARDIOGRAM (TEE);  Surgeon: Lars Masson, MD;  Location: [facility] ENDOSCOPY;  Service: Cardiovascular;  Laterality: N/A;     reports that he quit smoking about a year ago. His smoking use included cigarettes. He has a 24.00 pack-year smoking history. He has never used smokeless tobacco. He reports that he drinks alcohol. He reports that he does not use drugs.  Allergies  Allergen Reactions  . Buprenorphine Hcl Other (See Comments)    Patient stated that he did not do well on this medication. He felt like climbing the walls  .  Morphine And Related Other (See Comments)    Patient stated that he did not do well on this medication. He felt like climbing the walls  . Other Other (See Comments)    Patient stated that he did not do well on this medication. He felt like climbing the walls    Family History  Problem Relation Age of Onset  . Stroke Maternal Uncle   . Stroke Maternal Uncle   . Heart attack Neg Hx   . Hypertension Neg Hx     Prior to Admission medications   Medication Sig Start Date End Date Taking? Authorizing Provider  albuterol (PROVENTIL HFA;VENTOLIN HFA) 108 (90 Base) MCG/ACT inhaler Inhale 2 puffs into the lungs every 6 (six) hours as needed for wheezing or shortness of breath. 09/25/17   Leslye Peer, MD  albuterol (PROVENTIL) (2.5 MG/3ML) 0.083% nebulizer solution USE ONE VIAL IN NEBULIZER EVERY 4 HOURS AS NEEDED FOR WHEEZING AND FOR SHORTNESS OF BREATH 04/13/17   Leslye Peer, MD  amLODipine (NORVASC) 10 MG tablet Take 0.5 tablets (5 mg total) by mouth daily. 01/21/18   Lewayne Bunting, MD  aspirin EC  325 MG EC tablet Take 1 tablet (325 mg total) by mouth daily. 08/09/16   Gypsy Balsam K, NP  bisoprolol (ZEBETA) 5 MG tablet Take 0.5 tablets (2.5 mg total) by mouth daily. 01/23/18   Lewayne Bunting, MD  budesonide-formoterol (SYMBICORT) 160-4.5 MCG/ACT inhaler Inhale 2 puffs into the lungs 2 (two) times daily. 09/17/17   Leslye Peer, MD  citalopram (CELEXA) 10 MG tablet Take 10 mg by mouth daily. Reported on 06/06/2016    [provider]  furosemide (LASIX) 20 MG tablet TAKE 2 TABLETS EVERY DAY 04/02/18   Lewayne Bunting, MD  Investigational - Study Medication Take 1 tablet by mouth 2 (two) times daily. Study name: Galactic Heart Failure Research Study Additional study details: Omecamtiv Mecarbil or Placebo 08/28/17   Laurey Morale, MD  KLOR-CON M20 20 MEQ tablet TAKE 2 TABLETS THREE TIMES DAILY. PLEASE SCHEDULE APPOINTMENT FOR REFILLS. 01/18/18   Lewayne Bunting, MD  Multiple  Vitamin (MULITIVITAMIN WITH MINERALS) TABS Take 1 tablet by mouth daily.    [provider]  niacin (NIASPAN) 500 MG CR tablet Take 1,000 mg by mouth 2 (two) times daily.     [provider]  nitroGLYCERIN (NITROSTAT) 0.4 MG SL tablet Place 1 tablet (0.4 mg total) under the tongue every 5 (five) minutes as needed for chest pain. X 3 doses 01/21/18   Lewayne Bunting, MD  pravastatin (PRAVACHOL) 80 MG tablet TAKE 1 TABLET EVERY DAY 04/02/18   Lewayne Bunting, MD  sacubitril-valsartan (ENTRESTO) 49-51 MG Take 1 tablet by mouth 2 (two) times daily. 12/13/17   Azalee Course, PA  spironolactone (ALDACTONE) 25 MG tablet TAKE 1 TABLET EVERY DAY 04/02/18   Lewayne Bunting, MD  tiotropium (SPIRIVA HANDIHALER) 18 MCG inhalation capsule Place 1 capsule (18 mcg total) into inhaler and inhale daily. 12/27/17 12/27/18  Leslye Peer, MD    Physical Exam:  Constitutional: Elderly male who appears to be in respiratory distress currently receiving breathing treatment Vitals:   05/16/18 0349 05/16/18 0415 05/16/18 0445 05/16/18 0515  BP:  112/64 (!) 112/52 107/64  Pulse:  70 80 70  Resp:  19 20 17   Temp:      TempSrc:      SpO2: 94% 98% 98% 96%   Eyes: PERRL, lids and conjunctivae normal ENMT: Mucous membranes are moist. Posterior pharynx clear of any exudate or lesions.Normal dentition.  Neck: normal, supple, no masses, no thyromegaly Respiratory: Diffuse expiratory wheezes appreciated throughout both lung fields with decreased overall air movement.  Patient able to talk in 3-4 sentences at this time.   Cardiovascular: Regular rate and rhythm, no murmurs / rubs / gallops. No extremity edema. 2+ pedal pulses. No carotid bruits.  Abdomen: no tenderness, no masses palpated. No hepatosplenomegaly. Bowel sounds positive.  Musculoskeletal: Clubbing noted of the distal extremities.  No joint deformity upper and lower extremities. Good ROM, no contractures. Normal muscle tone.  Skin: Signs of venous  stasis noted of the lower extremities. Neurologic: CN 2-12 grossly intact. Sensation intact, DTR normal. Strength 5/5 in all 4.  Psychiatric: Normal judgment and insight. Alert and oriented x 3. Normal mood.     Labs on Admission: I have personally reviewed following labs and imaging studies  CBC: Recent Labs  Lab 05/16/18 0150 05/16/18 0208  WBC 9.0  --   NEUTROABS 5.2  --   HGB 17.5* 18.0*  HCT 55.1* 53.0*  MCV 97.7  --   PLT 174  --  Basic Metabolic Panel: Recent Labs  Lab 05/16/18 0150 05/16/18 0208  NA 143 143  K 5.1 4.9  CL 110 109  CO2 23  --   GLUCOSE 115* 113*  BUN 23* 31*  CREATININE 1.37* 1.30*  CALCIUM 9.0  --   MG 2.2  --    GFR: Estimated Creatinine Clearance: 55.5 mL/min (A) (by C-G formula based on SCr of 1.3 mg/dL (H)). Liver Function Tests: No results for input(s): AST, ALT, ALKPHOS, BILITOT, PROT, ALBUMIN in the last 168 hours. No results for input(s): LIPASE, AMYLASE in the last 168 hours. No results for input(s): AMMONIA in the last 168 hours. Coagulation Profile: No results for input(s): INR, PROTIME in the last 168 hours. Cardiac Enzymes: No results for input(s): CKTOTAL, CKMB, CKMBINDEX, TROPONINI in the last 168 hours. BNP (last 3 results) No results for input(s): PROBNP in the last 8760 hours. HbA1C: No results for input(s): HGBA1C in the last 72 hours. CBG: No results for input(s): GLUCAP in the last 168 hours. Lipid Profile: No results for input(s): CHOL, HDL, LDLCALC, TRIG, CHOLHDL, LDLDIRECT in the last 72 hours. Thyroid Function Tests: No results for input(s): TSH, T4TOTAL, FREET4, T3FREE, THYROIDAB in the last 72 hours. Anemia Panel: No results for input(s): VITAMINB12, FOLATE, FERRITIN, TIBC, IRON, RETICCTPCT in the last 72 hours. Urine analysis:    Component Value Date/Time   COLORURINE YELLOW 01/21/2016 1905   APPEARANCEUR CLEAR 01/21/2016 1905   LABSPEC >1.046 (H) 01/21/2016 1905   PHURINE 6.5 01/21/2016 1905    GLUCOSEU NEGATIVE 01/21/2016 1905   HGBUR NEGATIVE 01/21/2016 1905   BILIRUBINUR NEGATIVE 01/21/2016 1905   KETONESUR NEGATIVE 01/21/2016 1905   PROTEINUR NEGATIVE 01/21/2016 1905   UROBILINOGEN 1.0 10/24/2015 2051   NITRITE NEGATIVE 01/21/2016 1905   LEUKOCYTESUR NEGATIVE 01/21/2016 1905   Sepsis Labs: No results found for this or any previous visit (from the past 240 hour(s)).   Radiological Exams on Admission: Dg Chest 2 View  Result Date: 05/16/2018 CLINICAL DATA:  Acute onset of shortness of breath. EXAM: CHEST - 2 VIEW COMPARISON:  Chest radiograph performed 05/24/2017 FINDINGS: The lungs are well-aerated. Minimal bibasilar atelectasis is noted. There is no evidence of focal opacification, pleural effusion or pneumothorax. The heart is borderline enlarged. The patient is status post median sternotomy, with evidence of prior CABG. A pacemaker/AICD is noted at the left chest wall, with leads ending at the right atrium and right ventricle. No acute osseous abnormalities are seen. IMPRESSION: Minimal bibasilar atelectasis noted; lungs otherwise clear. Borderline cardiomegaly. Electronically Signed   By: Roanna Raider M.D.   On: 05/16/2018 03:26    EKG: Independently reviewed. Accelerated junctional rhythm  at 72 bpm  Assessment/Plan COPD exacerbation: Acute.  This would productive cough and wheezing.  Chest x-ray showed borderline cardiomegaly with atelectasis, but no acute infiltrate.  Patient required multiple hour-long breathing treatments while in the ED. - Admit to a stepdown bed -Continuous pulse oximetry with oxygen as needed - Duonebs QID and prn SOB/Wheezing - Brovana and budesonide nebs - Solu-Medrol 60 mg IV every 8 hours - Rocephin  Extensive cardiac history: systolic CHF EF noted to be 20-25%, s/p pacemaker/defibrillator, CAD s/p CABG in 1998, AAA, PAF, s/p watchman: Not on anticoagulation due to history of bleeding and AAA.  Patient is followed by Dr. [name] and had the  watchman procedure in 2017.  Patient appears to be euvolemic at this time. - Strict I&O's and daily weights - Fluid restriction - Continue to current medication regimen  Essential hypertension - Continue hydralazine, losartan, bisoprolol, amlodipine, furosemide  Depression  - Continue Celexa  CKD stage III: Stable. - Continue to monitor   Tobacco use disorder: Patient still intermittently uses tobacco  DVT prophylaxis: lovenox   Code Status: Full  Family Communication: No family present at bedside Disposition Plan: Discharge home once medically stable in 2 to 3 days Consults called: None  Admission status: Inpatient  Clydie Braun MD [facility] Pager [phone]   If 7PM-7AM, please contact night-coverage [email or web address] Password [email or web address]  05/16/2018, 5:41 AM

## 2018-05-16 NOTE — ED Notes (Signed)
Pt's CBG result was 131. Informed Jessica - RN.

## 2018-05-16 NOTE — Progress Notes (Signed)
This is a no charge note.  For further etails, please see H&P by my partner Dr. Katrinka Blazing from earlier today.    Patient seen and examined.  Off continuous albuterol. Stable on supplemental O2. Will transfer to med surg.  Continue IV steroids, scheduled and PRN nebs, taper Abx to doxycycline.

## 2018-05-16 NOTE — ED Notes (Signed)
Pt eating lunch

## 2018-05-16 NOTE — Progress Notes (Signed)
  CAT started.  

## 2018-05-17 DIAGNOSIS — I714 Abdominal aortic aneurysm, without rupture: Secondary | ICD-10-CM

## 2018-05-17 DIAGNOSIS — I25700 Atherosclerosis of coronary artery bypass graft(s), unspecified, with unstable angina pectoris: Secondary | ICD-10-CM

## 2018-05-17 DIAGNOSIS — N179 Acute kidney failure, unspecified: Secondary | ICD-10-CM

## 2018-05-17 DIAGNOSIS — Z95810 Presence of automatic (implantable) cardiac defibrillator: Secondary | ICD-10-CM

## 2018-05-17 LAB — BASIC METABOLIC PANEL
ANION GAP: 7 (ref 5–15)
BUN: 45 mg/dL — ABNORMAL HIGH (ref 6–20)
CHLORIDE: 109 mmol/L (ref 101–111)
CO2: 24 mmol/L (ref 22–32)
Calcium: 8.9 mg/dL (ref 8.9–10.3)
Creatinine, Ser: 1.86 mg/dL — ABNORMAL HIGH (ref 0.61–1.24)
GFR calc Af Amer: 39 mL/min — ABNORMAL LOW (ref >60)
GFR, EST NON AFRICAN AMERICAN: 34 mL/min — AB (ref >60)
GLUCOSE: 116 mg/dL — AB (ref 65–99)
POTASSIUM: 5.2 mmol/L — AB (ref 3.5–5.1)
Sodium: 140 mmol/L (ref 135–145)

## 2018-05-17 LAB — CBC
HCT: 48.5 % (ref 39.0–52.0)
HEMOGLOBIN: 15.5 g/dL (ref 13.0–17.0)
MCH: 31.4 pg (ref 26.0–34.0)
MCHC: 32 g/dL (ref 30.0–36.0)
MCV: 98.4 fL (ref 78.0–100.0)
PLATELETS: 161 10*3/uL (ref 150–400)
RBC: 4.93 MIL/uL (ref 4.22–5.81)
RDW: 14.7 % (ref 11.5–15.5)
WBC: 20.7 10*3/uL — AB (ref 4.0–10.5)

## 2018-05-17 MED ORDER — SODIUM CHLORIDE 0.9 % IV BOLUS
500.0000 mL | Freq: Once | INTRAVENOUS | Status: AC
  Administered 2018-05-17: 500 mL via INTRAVENOUS

## 2018-05-17 NOTE — Evaluation (Signed)
Physical Therapy Evaluation Patient Details Name: [name] [name] MRN: [id number] DOB: 1941-02-24 Today's Date: 05/17/2018   History of Present Illness  77 y.o. male with medical history significant of HTN, HLD, PAF, CAD s/p CABG, systolic CHF last EF 20 to 25% with grade 2 diastolic dysfunction, s/p PM/defibrillator, CVA, AAA, CKD stage III, and DVT; who presents with complaints of worsening shortness of breath. Chest x-ray showed borderline cardiomegaly with atelectasis, but no acute infiltrate.  Patient required multiple hour-long breathing treatments while in the ED.  Clinical Impression  Patient evaluated by Physical Therapy with no further acute PT needs identified. All education has been completed and the patient has no further questions. Pt is mod I with bed mobility, independent with transfers and mod I with ambulation of 600 feet without AD. Pt completed a DGI and scored 22/22 placing him at low risk for falling.  PT is signing off. Thank you for this referral.     Follow Up Recommendations No PT follow up    Equipment Recommendations  None recommended by PT    Recommendations for Other Services       Precautions / Restrictions Precautions Precautions: None Restrictions Weight Bearing Restrictions: No      Mobility  Bed Mobility Overal bed mobility: Modified Independent                Transfers Overall transfer level: Independent                  Ambulation/Gait Ambulation/Gait assistance: Modified independent (Device/Increase time) Ambulation Distance (Feet): 600 Feet Assistive device: None Gait Pattern/deviations: WFL(Within Functional Limits) Gait velocity: WFL Gait velocity interpretation: 1.31 - 2.62 ft/sec, indicative of limited community ambulator General Gait Details: strong steady gait, able to walk and talk although experienced 2/4 DoE         Balance Overall balance assessment: Modified Independent                               Standardized Balance Assessment Standardized Balance Assessment : Dynamic Gait Index   Dynamic Gait Index Level Surface: Normal Change in Gait Speed: Normal Gait with Horizontal Head Turns: Normal Gait with Vertical Head Turns: Normal Gait and Pivot Turn: Normal Step Over Obstacle: Normal Step Around Obstacles: Mild Impairment Steps: Mild Impairment Total Score: 22       Pertinent Vitals/Pain Pain Assessment: No/denies pain    Home Living Family/patient expects to be discharged to:: Private residence Living Arrangements: Children Available Help at Discharge: Family;Available PRN/intermittently Type of Home: House Home Access: Stairs to enter Entrance Stairs-Rails: Can reach both Entrance Stairs-Number of Steps: 5 Home Layout: One level Home Equipment: Grab bars - tub/shower;Hand held shower head      Prior Function Level of Independence: Independent                  Extremity/Trunk Assessment   Upper Extremity Assessment Upper Extremity Assessment: Overall WFL for tasks assessed    Lower Extremity Assessment Lower Extremity Assessment: Overall WFL for tasks assessed       Communication   Communication: No difficulties  Cognition Arousal/Alertness: Awake/alert Behavior During Therapy: WFL for tasks assessed/performed Overall Cognitive Status: Within Functional Limits for tasks assessed  General Comments General comments (skin integrity, edema, etc.): Pt on RA at rest SaO2 96%O2, HR 87bpm, with activity SaO2 dropped to 88%O2 and quickly rebounded once pt stopped talking and took some deep breaths, max HR with activity 110 bpm.        Assessment/Plan    PT Assessment Patent does not need any further PT services         PT Goals (Current goals can be found in the Care Plan section)  Acute Rehab PT Goals Patient Stated Goal: go home PT Goal Formulation: With patient     AM-PAC PT  "6 Clicks" Daily Activity  Outcome Measure Difficulty turning over in bed (including adjusting bedclothes, sheets and blankets)?: None Difficulty moving from lying on back to sitting on the side of the bed? : None Difficulty sitting down on and standing up from a chair with arms (e.g., wheelchair, bedside commode, etc,.)?: None Help needed moving to and from a bed to chair (including a wheelchair)?: None Help needed walking in hospital room?: None Help needed climbing 3-5 steps with a railing? : None 6 Click Score: 24    End of Session Equipment Utilized During Treatment: Gait belt Activity Tolerance: Patient tolerated treatment well Patient left: in bed;with call bell/phone within reach Nurse Communication: Mobility status PT Visit Diagnosis: Other abnormalities of gait and mobility (R26.89)    Time: 1610-9604 PT Time Calculation (min) (ACUTE ONLY): 35 min   Charges:   PT Evaluation $PT Eval Moderate Complexity: 1 Mod PT Treatments $Gait Training: 8-22 mins   PT G Codes:        [name] [name] B. Beverely Risen PT, DPT Acute Rehabilitation  [phone] Pager [phone]    [name] [name] 05/17/2018, 11:48 AM

## 2018-05-17 NOTE — Progress Notes (Signed)
PROGRESS NOTE    [name] [name]  UEA:[id number] DOB: [birth year] DOA: 05/16/2018 PCP: [name], MD      Brief Narrative:  [name] [name] is a 77 y.o. M with PAF s/p watchman, no AC, CAD s/p CABG, systolic CHF last EF 20 to 25% with grade 2 diastolic dysfunction, s/p PM/defibrillator, hx CVA, AAA, CKD stage III, HTN and DVT who presents with dyspnea, wheezing.       Assessment & Plan:  COPD with exacerbation Wheezing on presentation with dyspnea, relieved with albuterol. -Continue Solu-medrol -Continue GI ppx -Continue albuterol, scheduled and p.r.n. -Continue aformoterol/budesonide nebs -Continue doxycycline   Acute on chronic kidney injury Hyperkalemia Baseline Cr 1.3, today up to 1.8 mg/dL.   -Hold ARB, spiro -Hold K suppl -Hold Lasix -Gentle IV fluid bolus -Trend Cr  Chronic systolic CHF S/p pacemaker/defibrillator  CAD s/p CABG in 1998 AAA Paroxysmal atrial fibrillation  S/p watchman 2017 EF 20-25%. CHADs2Vasc 6 with prior stroke.  Not on anticoag due to history of GI bleeding.   -Holf furosemide and spironolactone today -Strict I/Os, daily weights, telemetry  -Daily monitoring renal function -Continue amlodipine, BB, aspirin, statin, hydralazine -Hold ARB  Smoking Smoking cessation recommended, modalities discussed.  Depression  -Continue SSRI      DVT prophylaxis: Lovenox Code Status: FULL Family Communication: None present MDM and disposition Plan: The below labs and imaging reports were reviewed and summarized above.    The patient was admitted with acute hypoxic respiratory failure (severe dyspnea increased work of breathing, with hypoxia requiring supplemental O2) from a severe exacerbation of his chronic COPD.  He is off oxygen but still significantly short of breath.  He also has a new AKI, which we will work up and challenge with fluids.  If Cr better tomorrow and breathing closer to baseline, likely home Saturday or  Sunday.       Consultants:   None  Procedures:   None  Antimicrobials:   Ceftriaxone x1  Doxycycline 6/6 >>    Subjective: Feels very dyspneic with any activity.  Still wheezing.  Still coughing.  No chest pian, sputum.  No confusion, weakness.  No fever.  Objective: Vitals:   05/16/18 1555 05/16/18 1927 05/16/18 2220 05/16/18 2358  BP:   (!) 118/51 (!) 116/43  Pulse: 82   82  Resp: (!) 21   20  Temp:    98.4 F (36.9 C)  TempSrc:    Oral  SpO2: 97% 98%  95%  Weight: 88.5 kg (195 lb 3.2 oz)     Height: 6\' 1"  (1.854 m)       Intake/Output Summary (Last 24 hours) at 05/17/2018 0550 Last data filed at 05/16/2018 2100 Gross per 24 hour  Intake 520 ml  Output 425 ml  Net 95 ml   Filed Weights   05/16/18 1555  Weight: 88.5 kg (195 lb 3.2 oz)    Examination: General appearance: Elderly adult male, alert and in mild respiratory distress.  Sitting up in bed. HEENT: Anicteric, conjunctiva pink, lids and lashes normal. No nasal deformity, discharge, epistaxis.  Lips moist, dentures in place, OP tacky dry, no oral lesions.   Skin: Warm and dry.  No jaundice.  No suspicious rashes or lesions. Cardiac: Tachycardic, nl S1-S2, no murmurs appreciated.  Capillary refill is brisk.  JVP normal.  No LE edema.  Radial pulses 2+ and symmetric. Respiratory: Increased respiratory rate, wheezing bilaterally with inspiration and expiration.  Speaks in short sentences, pursed lip breathing. Abdomen: Abdomen soft.  No TTP. No ascites, distension, hepatosplenomegaly.   MSK: No deformities or effusions. Neuro: Awake and alert.  EOMI, moves all extremities. Speech fluent.    Psych: Sensorium intact and responding to questions, attention normal. Affect normal.  Judgment and insight appear normal.    Data Reviewed: I have personally reviewed following labs and imaging studies:  CBC: Recent Labs  Lab 05/16/18 0150 05/16/18 0208 05/17/18 0345  WBC 9.0  --  20.7*  NEUTROABS 5.2  --    --   HGB 17.5* 18.0* 15.5  HCT 55.1* 53.0* 48.5  MCV 97.7  --  98.4  PLT 174  --  161   Basic Metabolic Panel: Recent Labs  Lab 05/16/18 0150 05/16/18 0208 05/17/18 0345  NA 143 143 140  K 5.1 4.9 5.2*  CL 110 109 109  CO2 23  --  24  GLUCOSE 115* 113* 116*  BUN 23* 31* 45*  CREATININE 1.37* 1.30* 1.86*  CALCIUM 9.0  --  8.9  MG 2.2  --   --    GFR: Estimated Creatinine Clearance: 38.8 mL/min (A) (by C-G formula based on SCr of 1.86 mg/dL (H)). Liver Function Tests: No results for input(s): AST, ALT, ALKPHOS, BILITOT, PROT, ALBUMIN in the last 168 hours. No results for input(s): LIPASE, AMYLASE in the last 168 hours. No results for input(s): AMMONIA in the last 168 hours. Coagulation Profile: No results for input(s): INR, PROTIME in the last 168 hours. Cardiac Enzymes: No results for input(s): CKTOTAL, CKMB, CKMBINDEX, TROPONINI in the last 168 hours. BNP (last 3 results) No results for input(s): PROBNP in the last 8760 hours. HbA1C: No results for input(s): HGBA1C in the last 72 hours. CBG: No results for input(s): GLUCAP in the last 168 hours. Lipid Profile: No results for input(s): CHOL, HDL, LDLCALC, TRIG, CHOLHDL, LDLDIRECT in the last 72 hours. Thyroid Function Tests: No results for input(s): TSH, T4TOTAL, FREET4, T3FREE, THYROIDAB in the last 72 hours. Anemia Panel: No results for input(s): VITAMINB12, FOLATE, FERRITIN, TIBC, IRON, RETICCTPCT in the last 72 hours. Urine analysis:    Component Value Date/Time   COLORURINE YELLOW 01/21/2016 1905   APPEARANCEUR CLEAR 01/21/2016 1905   LABSPEC >1.046 (H) 01/21/2016 1905   PHURINE 6.5 01/21/2016 1905   GLUCOSEU NEGATIVE 01/21/2016 1905   HGBUR NEGATIVE 01/21/2016 1905   BILIRUBINUR NEGATIVE 01/21/2016 1905   KETONESUR NEGATIVE 01/21/2016 1905   PROTEINUR NEGATIVE 01/21/2016 1905   UROBILINOGEN 1.0 10/24/2015 2051   NITRITE NEGATIVE 01/21/2016 1905   LEUKOCYTESUR NEGATIVE 01/21/2016 1905   Sepsis  Labs: @LABRCNTIP (procalcitonin:4,lacticacidven:4)  ) Recent Results (from the past 240 hour(s))  MRSA PCR Screening     Status: None   Collection Time: 05/16/18  3:09 PM  Result Value Ref Range Status   MRSA by PCR NEGATIVE NEGATIVE Final    Comment:        The GeneXpert MRSA Assay (FDA approved for NASAL specimens only), is one component of a comprehensive MRSA colonization surveillance program. It is not intended to diagnose MRSA infection nor to guide or monitor treatment for MRSA infections. Performed at [facility], [address], [place], [place] [address]          Radiology Studies: Dg Chest 2 View  Result Date: 05/16/2018 CLINICAL DATA:  Acute onset of shortness of breath. EXAM: CHEST - 2 VIEW COMPARISON:  Chest radiograph performed 05/24/2017 FINDINGS: The lungs are well-aerated. Minimal bibasilar atelectasis is noted. There is no evidence of focal opacification, pleural effusion or  pneumothorax. The heart is borderline enlarged. The patient is status post median sternotomy, with evidence of prior CABG. A pacemaker/AICD is noted at the left chest wall, with leads ending at the right atrium and right ventricle. No acute osseous abnormalities are seen. IMPRESSION: Minimal bibasilar atelectasis noted; lungs otherwise clear. Borderline cardiomegaly. Electronically Signed   By: Roanna Raider M.D.   On: 05/16/2018 03:26        Scheduled Meds: . amLODipine  5 mg Oral Daily  . arformoterol  15 mcg Nebulization BID  . aspirin  325 mg Oral Daily  . bisoprolol  2.5 mg Oral Daily  . budesonide (PULMICORT) nebulizer solution  0.5 mg Nebulization BID  . citalopram  20 mg Oral Daily  . doxycycline  100 mg Oral Q12H  . enoxaparin (LOVENOX) injection  40 mg Subcutaneous Q24H  . guaiFENesin  600 mg Oral BID  . hydrALAZINE  50 mg Oral Q8H  . ipratropium-albuterol  3 mL Nebulization QID  . methylPREDNISolone (SOLU-MEDROL) injection  60 mg Intravenous Q24H  . niacin  500  mg Oral BID  . pantoprazole  40 mg Oral BID AC  . pravastatin  80 mg Oral q1800   Continuous Infusions: . sodium chloride       LOS: 1 day    Time spent: 25 minutes    Alberteen Sam, MD [facility] 05/17/2018, 2:37 PM     Pager [phone] --- please page though AMION:  [email or web address] If 7PM-7AM, please contact night-coverage

## 2018-05-18 LAB — CBC
HEMATOCRIT: 46.9 % (ref 39.0–52.0)
Hemoglobin: 14.9 g/dL (ref 13.0–17.0)
MCH: 31.1 pg (ref 26.0–34.0)
MCHC: 31.8 g/dL (ref 30.0–36.0)
MCV: 97.9 fL (ref 78.0–100.0)
PLATELETS: 169 10*3/uL (ref 150–400)
RBC: 4.79 MIL/uL (ref 4.22–5.81)
RDW: 14.5 % (ref 11.5–15.5)
WBC: 15 10*3/uL — AB (ref 4.0–10.5)

## 2018-05-18 LAB — BASIC METABOLIC PANEL
ANION GAP: 9 (ref 5–15)
BUN: 45 mg/dL — ABNORMAL HIGH (ref 6–20)
CALCIUM: 8.8 mg/dL — AB (ref 8.9–10.3)
CO2: 25 mmol/L (ref 22–32)
Chloride: 107 mmol/L (ref 101–111)
Creatinine, Ser: 1.8 mg/dL — ABNORMAL HIGH (ref 0.61–1.24)
GFR, EST AFRICAN AMERICAN: 41 mL/min — AB (ref >60)
GFR, EST NON AFRICAN AMERICAN: 35 mL/min — AB (ref >60)
Glucose, Bld: 118 mg/dL — ABNORMAL HIGH (ref 65–99)
Potassium: 5.1 mmol/L (ref 3.5–5.1)
Sodium: 141 mmol/L (ref 135–145)

## 2018-05-18 MED ORDER — ALBUTEROL SULFATE (2.5 MG/3ML) 0.083% IN NEBU: 2.5000 mg | INHALATION_SOLUTION | RESPIRATORY_TRACT | 11 refills | Status: AC | PRN

## 2018-05-18 MED ORDER — GUAIFENESIN ER 600 MG PO TB12: 600.0000 mg | ORAL_TABLET | Freq: Two times a day (BID) | ORAL | 0 refills | Status: AC

## 2018-05-18 MED ORDER — DOXYCYCLINE HYCLATE 100 MG PO TABS: 100.0000 mg | ORAL_TABLET | Freq: Two times a day (BID) | ORAL | 0 refills | Status: DC

## 2018-05-18 MED ORDER — PREDNISONE 10 MG (21) PO TBPK: ORAL_TABLET | ORAL | 0 refills | Status: DC

## 2018-05-18 NOTE — Discharge Summary (Signed)
Physician Discharge Summary  [name] [name] HWY:[id number] DOB: 11/15/1941 DOA: 05/16/2018  PCP: [name] [name]., MD  Admit date: 05/16/2018 Discharge date: 05/18/2018  Admitted From: Home  Disposition:  Home   Recommendations for Outpatient Follow-up:  1. Follow up with PCP in 1-2 weeks 2. Please check renal function in 5 days and restart Entresto if needed  Home Health: None  Equipment/Devices: None  Discharge Condition: Good  CODE STATUS: FULL Diet recommendation: Cardiac  Brief/Interim Summary: [name] [name] is a 77 y.o. M with PAF s/p watchman, no AC, CAD s/p CABG,systolic CHF last EF 20 to 25% with grade 2 diastolic dysfunction,s/p PM/defibrillator,hx CVA, AAA, CKD stage III, HTN and DVT who presents with dyspnea, wheezing found to have severe exacerbation of his chronic COPD.       COPD with exacerbation The patient was treated with IV steroids, doxycycline, and frequent nebulized albuterol.  He slowly improved.  On the day of discharge, the patient is able to ambulate the length of the hall with only moderate dyspnea.  Renal insufficiency Chronic kidney disease Patient had some mild renal insufficiency (Cr up to 1.8 from 1.3) while in the hospital.  His diuretics and Entresto were temporarily held and he was given fluids.  No concern for congestive nephropathy (BNP 100, no congestion on exam).   -Discharged with close lab follow up and holding Entresto, spironolactone  Smoking  Smoking cessation recommended, modalities discussed.  Chronic systolic CHF S/ppacemaker/defibrillator  CADs/p CABG in 1998 AAA Paroxysmal atrial fibrillation  S/pwatchman 2017        Discharge Diagnoses:  Principal Problem:   COPD exacerbation [facility]) Active Problems:   CAD- CABG '98- cath 2007 and Aug 2013- Med Rx   Automatic implantable cardioverter-defibrillator in situ   Abdominal aortic aneurysm without rupture [facility])    Discharge Instructions  Discharge Instructions     Diet - low sodium heart healthy   Complete by:  As directed    Discharge instructions   Complete by:  As directed    From Dr. Maryfrances [name]: You were admitted for trouble breathing from a flare of your emphysema.   We treated this with steroids (Solu-medrol and prednisone) and antibiotics (doxycycline).  When you go home, you should complete these treatments: Take prednisone 60 mg (6 tabs) on day 1 (Sunday), then  Take prednisone 50 mg (5 tabs) on Monday, then Take prednisone 40 mg (4 tabs) on Tuesday Take prednisone 30 mg (3 tabs) on Wednesday Take prednisone 20 mg (2 tabs) on Thursday Take prednisone 10 mg (1 tab) on Friday  Take doxycycline 100 mg twice daily until gone.  I have refilled your albuterol prescription if you need it.  Use albuterol (either in your nebulizer or with an inhaler) up to every 4 hours as needed Use guiafenesin/Mucinex tablets twice daily for cough  Resume your home Spiriva and Symbicort daily.   Also, your kidney function was worse than usual during this hospitalization: When you go home, resume your home amlodipine and bisoprolol for now. Call [name] [name] office for a follow up appointment this week, and ask them to check your kidney function this week. Wait until you have your kidney function rechecked before you restart your Entresto or spironolactone.  Weigh yourself daily.  If your weight goes above 199 lbs, restart your Lasix.  Otherwise, hold your Lasix until you have your kidney function re-checked.   Increase activity slowly   Complete by:  As directed      Allergies as of  05/18/2018      Reactions   Buprenorphine Hcl Other (See Comments)   Patient stated that he did not do well on this medication. He felt like climbing the walls   Morphine And Related Other (See Comments)   Patient stated that he did not do well on this medication. He felt like climbing the walls   Other Other (See Comments)   Patient stated that he did not do well on this  medication. He felt like climbing the walls      Medication List    TAKE these medications   albuterol 108 (90 Base) MCG/ACT inhaler Commonly known as:  PROVENTIL HFA;VENTOLIN HFA Inhale 2 puffs into the lungs every 6 (six) hours as needed for wheezing or shortness of breath. What changed:  Another medication with the same name was changed. Make sure you understand how and when to take each.   albuterol (2.5 MG/3ML) 0.083% nebulizer solution Commonly known as:  PROVENTIL Take 3 mLs (2.5 mg total) by nebulization every 4 (four) hours as needed for wheezing or shortness of breath. What changed:  See the new instructions.   amLODipine 10 MG tablet Commonly known as:  NORVASC Take 0.5 tablets (5 mg total) by mouth daily.   aspirin 325 MG EC tablet Take 1 tablet (325 mg total) by mouth daily.   bisoprolol 5 MG tablet Commonly known as:  ZEBETA Take 0.5 tablets (2.5 mg total) by mouth daily.   budesonide-formoterol 160-4.5 MCG/ACT inhaler Commonly known as:  SYMBICORT Inhale 2 puffs into the lungs 2 (two) times daily.   citalopram 20 MG tablet Commonly known as:  CELEXA Take 20 mg by mouth daily.   doxycycline 100 MG tablet Commonly known as:  VIBRA-TABS Take 1 tablet (100 mg total) by mouth every 12 (twelve) hours.   furosemide 20 MG tablet Commonly known as:  LASIX TAKE 2 TABLETS EVERY DAY   guaiFENesin 600 MG 12 hr tablet Commonly known as:  MUCINEX Take 1 tablet (600 mg total) by mouth 2 (two) times daily.   Investigational - Study Medication Take 1 tablet by mouth 2 (two) times daily. Study name: Galactic Heart Failure Research Study Additional study details: Omecamtiv Mecarbil or Placebo What changed:  additional instructions   KLOR-CON M20 20 MEQ tablet Generic drug:  potassium chloride SA TAKE 2 TABLETS THREE TIMES DAILY. PLEASE SCHEDULE APPOINTMENT FOR REFILLS. What changed:  See the new instructions.   multivitamin with minerals Tabs tablet Take 1 tablet  by mouth daily.   niacin 500 MG CR tablet Commonly known as:  NIASPAN Take 500 mg by mouth 2 (two) times daily.   nitroGLYCERIN 0.4 MG SL tablet Commonly known as:  NITROSTAT Place 1 tablet (0.4 mg total) under the tongue every 5 (five) minutes as needed for chest pain. X 3 doses   pravastatin 80 MG tablet Commonly known as:  PRAVACHOL TAKE 1 TABLET EVERY DAY   predniSONE 10 MG (21) Tbpk tablet Commonly known as:  STERAPRED UNI-PAK 21 TAB Take prednisone taper as directed.   sacubitril-valsartan 49-51 MG Commonly known as:  ENTRESTO Take 1 tablet by mouth 2 (two) times daily.   spironolactone 25 MG tablet Commonly known as:  ALDACTONE TAKE 1 TABLET EVERY DAY   tiotropium 18 MCG inhalation capsule Commonly known as:  SPIRIVA HANDIHALER Place 1 capsule (18 mcg total) into inhaler and inhale daily.       Allergies  Allergen Reactions  . Buprenorphine Hcl Other (See Comments)  Patient stated that he did not do well on this medication. He felt like climbing the walls  . Morphine And Related Other (See Comments)    Patient stated that he did not do well on this medication. He felt like climbing the walls  . Other Other (See Comments)    Patient stated that he did not do well on this medication. He felt like climbing the walls    Consultations:  None   Procedures/Studies: Dg Chest 2 View  Result Date: 05/16/2018 CLINICAL DATA:  Acute onset of shortness of breath. EXAM: CHEST - 2 VIEW COMPARISON:  Chest radiograph performed 05/24/2017 FINDINGS: The lungs are well-aerated. Minimal bibasilar atelectasis is noted. There is no evidence of focal opacification, pleural effusion or pneumothorax. The heart is borderline enlarged. The patient is status post median sternotomy, with evidence of prior CABG. A pacemaker/AICD is noted at the left chest wall, with leads ending at the right atrium and right ventricle. No acute osseous abnormalities are seen. IMPRESSION: Minimal bibasilar  atelectasis noted; lungs otherwise clear. Borderline cardiomegaly. Electronically Signed   By: Roanna Raider M.D.   On: 05/16/2018 03:26       Subjective: Feeling still wheezy but breathing improved.  No orthopnea, leg swelling, PND.  No fever, cough, sputum.  Discharge Exam: Vitals:   05/18/18 1009 05/18/18 1119  BP: (!) 113/91   Pulse: 77   Resp:    Temp:    SpO2:  95%   Vitals:   05/18/18 0716 05/18/18 0809 05/18/18 1009 05/18/18 1119  BP:  (!) 142/76 (!) 113/91   Pulse:  71 77   Resp:  18    Temp:  97.7 F (36.5 C)    TempSrc:  Oral    SpO2: 95% 97%  95%  Weight:      Height:        General: Pt is alert, awake, not in acute distress, sitting up in bed, just finished breakfast Cardiovascular: RRR, S1/S2 +, no rubs, no gallops Respiratory: CTA bilaterally, wheezing bilaterally, no rales Abdominal: Soft, NT, ND, bowel sounds + Extremities: no edema, no cyanosis    The results of significant diagnostics from this hospitalization (including imaging, microbiology, ancillary and laboratory) are listed below for reference.     Microbiology: Recent Results (from the past 240 hour(s))  MRSA PCR Screening     Status: None   Collection Time: 05/16/18  3:09 PM  Result Value Ref Range Status   MRSA by PCR NEGATIVE NEGATIVE Final    Comment:        The GeneXpert MRSA Assay (FDA approved for NASAL specimens only), is one component of a comprehensive MRSA colonization surveillance program. It is not intended to diagnose MRSA infection nor to guide or monitor treatment for MRSA infections. Performed at [facility], [address], [place], [place] [address]      Labs: BNP (last 3 results) Recent Labs    05/25/17 0004 05/16/18 0204  BNP 2,353.9* 107.1*   Basic Metabolic Panel: Recent Labs  Lab 05/16/18 0150 05/16/18 0208 05/17/18 0345 05/18/18 0259  NA 143 143 140 141  K 5.1 4.9 5.2* 5.1  CL 110 109 109 107  CO2 23  --  24 25  GLUCOSE 115*  113* 116* 118*  BUN 23* 31* 45* 45*  CREATININE 1.37* 1.30* 1.86* 1.80*  CALCIUM 9.0  --  8.9 8.8*  MG 2.2  --   --   --    Liver Function Tests:  No results for input(s): AST, ALT, ALKPHOS, BILITOT, PROT, ALBUMIN in the last 168 hours. No results for input(s): LIPASE, AMYLASE in the last 168 hours. No results for input(s): AMMONIA in the last 168 hours. CBC: Recent Labs  Lab 05/16/18 0150 05/16/18 0208 05/17/18 0345 05/18/18 0259  WBC 9.0  --  20.7* 15.0*  NEUTROABS 5.2  --   --   --   HGB 17.5* 18.0* 15.5 14.9  HCT 55.1* 53.0* 48.5 46.9  MCV 97.7  --  98.4 97.9  PLT 174  --  161 169   Cardiac Enzymes: No results for input(s): CKTOTAL, CKMB, CKMBINDEX, TROPONINI in the last 168 hours. BNP: Invalid input(s): POCBNP CBG: No results for input(s): GLUCAP in the last 168 hours. D-Dimer No results for input(s): DDIMER in the last 72 hours. Hgb A1c No results for input(s): HGBA1C in the last 72 hours. Lipid Profile No results for input(s): CHOL, HDL, LDLCALC, TRIG, CHOLHDL, LDLDIRECT in the last 72 hours. Thyroid function studies No results for input(s): TSH, T4TOTAL, T3FREE, THYROIDAB in the last 72 hours.  Invalid input(s): FREET3 Anemia work up No results for input(s): VITAMINB12, FOLATE, FERRITIN, TIBC, IRON, RETICCTPCT in the last 72 hours. Urinalysis    Component Value Date/Time   COLORURINE YELLOW 01/21/2016 1905   APPEARANCEUR CLEAR 01/21/2016 1905   LABSPEC >1.046 (H) 01/21/2016 1905   PHURINE 6.5 01/21/2016 1905   GLUCOSEU NEGATIVE 01/21/2016 1905   HGBUR NEGATIVE 01/21/2016 1905   BILIRUBINUR NEGATIVE 01/21/2016 1905   KETONESUR NEGATIVE 01/21/2016 1905   PROTEINUR NEGATIVE 01/21/2016 1905   UROBILINOGEN 1.0 10/24/2015 2051   NITRITE NEGATIVE 01/21/2016 1905   LEUKOCYTESUR NEGATIVE 01/21/2016 1905   Sepsis Labs Invalid input(s): PROCALCITONIN,  WBC,  LACTICIDVEN Microbiology Recent Results (from the past 240 hour(s))  MRSA PCR Screening     Status:  None   Collection Time: 05/16/18  3:09 PM  Result Value Ref Range Status   MRSA by PCR NEGATIVE NEGATIVE Final    Comment:        The GeneXpert MRSA Assay (FDA approved for NASAL specimens only), is one component of a comprehensive MRSA colonization surveillance program. It is not intended to diagnose MRSA infection nor to guide or monitor treatment for MRSA infections. Performed at [facility], [address] [place], [place] [address]      Time coordinating discharge: 25 minutes       SIGNED:   Alberteen Sam, MD  [facility] 05/18/2018, 2:39 PM

## 2018-05-20 ENCOUNTER — Ambulatory Visit (INDEPENDENT_AMBULATORY_CARE_PROVIDER_SITE_OTHER): Payer: Medicare HMO

## 2018-05-20 DIAGNOSIS — Z95810 Presence of automatic (implantable) cardiac defibrillator: Secondary | ICD-10-CM

## 2018-05-20 DIAGNOSIS — I5042 Chronic combined systolic (congestive) and diastolic (congestive) heart failure: Secondary | ICD-10-CM

## 2018-05-21 ENCOUNTER — Telehealth: Payer: Self-pay | Admitting: *Deleted

## 2018-05-21 ENCOUNTER — Telehealth: Payer: Self-pay

## 2018-05-21 NOTE — Telephone Encounter (Signed)
Remote ICM transmission received.  Attempted call to patient and left detailed message, per DPR, regarding transmission. Requested a call back.

## 2018-05-21 NOTE — Telephone Encounter (Signed)
Spoke with [name] [name] re: his follow up labs and PCP visit after hospitalization.  Entresto, spironolactone and lasix still on hold pending labs. PCP appointment scheduled for today.

## 2018-05-21 NOTE — Progress Notes (Signed)
EPIC Encounter for ICM Monitoring  Patient Name: [name] [name] is a 77 y.o. male Date: 05/21/2018 Primary Care Physican: Raina Mina., MD Primary Cardiologist:Crenshaw Electrophysiologist: [name] Dry Weight:195.4 lbs Bi-V Pacing: 97%        Attempted call to patient and unable to reach.  Left message to return call regarding transmission.  Transmission reviewed.          Epic shows hospitalization 05/16/2018 to 05/18/2018 for COPD exacerbation and diuretics were held but resumed at discharge.   Thoracic impedance abnormal suggesting fluid accumulation since 05/16/2018.  Impedance also decreased below baseline 04/29/2018 to 05/08/2018.  Prescribed dosage: Furosemide 20 mg 2tablets (40 mg total) daily. Potassium 20 mEq 1tablets aday.   Labs: 03/05/2018 Creatinine 1.30, BUN 13, Potassium 3.4, Sodium 147, EGFR 53-62 Care Everywhere 09/17/2017 Creatinine1.31, BUN26, Potassium4.1, [id number], [id number] 06/26/2017 Creatinine1.13, BUN27, Potassium5.1, [id number], [id number]  06/19/2017 Creatinine1.31, BUN33, Potassium5.1, [id number], [id number]  06/12/2017 Creatinine1.44, BUN41, Potassium5.5, [id number], [id number]  05/29/2017 Creatinine1.59, BUN48, Potassium3.1, Sodium143, AXEN40-76  05/28/2017 Creatinine1.49, BUN41, Potassium3.3, [id number], RXYV85-92  05/25/2017 Creatinine1.41, BUN20, Potassium2.8, [id number], [id number] 02/01/2017 Creatinine 1.44, BUN 23, Potassium 3.2. Sodium 146 08/15/2016 Creatinine 1.23, BUN 21, Potassium 3.6, Sodium 146  08/09/2016 Creatinine 1.36, BUN 21, Potassium 2.7, Sodium 143  07/10/2016 Creatinine 1.72, BUN 33, Potassium 5.1, Sodium 143  06/02/2016 Creatinine 1.44, BUN 20, Potassium 3.0, Sodium 144  Recommendations: Left voice mail with ICM number and encouraged to call.  Follow-up plan: ICM clinic phone appointment on 05/30/2018 to recheck fluid levels.    Copy of ICM check sent to Dr. Rayann Heman and Dr. Stanford Breed for review  and recommendations if needed.    3 month ICM trend: 05/20/2018    AT/AF   1 Year ICM trend:       Rosalene Billings, RN 05/21/2018 8:25 AM

## 2018-05-23 NOTE — Progress Notes (Signed)
Spoke with patient.  He reported he was instructed to restart Entresto and Furosemide.  He is to taken an extra Furosemide if weight is >197 lbs.  Today's weight is 195.  Transmission reviewed and he does not have any fluid symptoms although impedance is significantly below baseline which correlates with Lasix being held.  Advised to call for any fluid symptoms or if taking extra Lasix does not drop weight below 197 lbs.  Recheck remote transmission on 05/30/2018.

## 2018-05-28 ENCOUNTER — Telehealth: Payer: Self-pay

## 2018-05-28 NOTE — Telephone Encounter (Signed)
Received ICM call from patient.  He bought scales to monitor daily weight.  He was a little concerned because his weight has dropped from 195 to 186-188 lbs since 05/20/2018.  He did take extra Furosemide x 2 days since the weight was around 195 lbs.  He returned to taking prescribed Furosemide of 40 mg daily.  He is unsure since weight is low when to take an extra fluid pill.  Advised to call if he experiences fluid symptoms.  He stated he would do so.  Checked [facility] thoracic impedance direct trend viewer which updated today, 05/28/2018.  Advised he may be a little dry and to drink up to 64 oz fluid daily and not to take an extra fluid pills unless he has weight gain of 3 lbs within 24 hours or 5 lbs in a week.

## 2018-05-30 ENCOUNTER — Ambulatory Visit (INDEPENDENT_AMBULATORY_CARE_PROVIDER_SITE_OTHER): Payer: Self-pay

## 2018-05-30 DIAGNOSIS — Z95810 Presence of automatic (implantable) cardiac defibrillator: Secondary | ICD-10-CM

## 2018-05-30 DIAGNOSIS — I5042 Chronic combined systolic (congestive) and diastolic (congestive) heart failure: Secondary | ICD-10-CM

## 2018-05-30 NOTE — Progress Notes (Signed)
EPIC Encounter for ICM Monitoring  Patient Name: [name] [name] is a 77 y.o. male Date: 05/30/2018 Primary Care Physican: Raina Mina., MD Primary Cardiologist:Crenshaw Electrophysiologist: [name] Dry Weight:188lbs Bi-V Pacing: 97%       Heart Failure questions reviewed, pt asymptomatic.  Reviewed dehydration symptoms and advised to drink about 64 oz daily for hydration.  Unable to determine how much fluid he drinks daily.    Thoracic impedance above baseline suggesting dryness since restarting Lasix after hospitalization dishcarge.  Prescribed dosage: Furosemide 20 mg 2tablets (40 mg total) daily. Potassium 20 mEq 1tablets aday.   Labs: 05/18/2018 Creatinine 1.80, BUN 45, Potassium 5.1, Sodium 141, EGFR 35-41 05/17/2018 Creatinine 1.86, BUN 45, Potassium 5.2, Sodium 140, EGFR 34-39  05/16/2018 Creatinine 1.30, BUN 31, Potassium 4.9, Sodium 143 @ 2:08 AM  05/16/2018 Creatinine 1.37, BUN 23, Potassium 5.1, Sodium 143, EGFR 49-57  @ 1:50 AM  03/05/2018 Creatinine 1.30, BUN 13, Potassium 3.4, Sodium 147, EGFR 53-62 Care Everywhere 09/17/2017 Creatinine1.31, BUN26, Potassium4.1, [id number], [id number] 06/26/2017 Creatinine1.13, BUN27, Potassium5.1, [id number], [id number]  06/19/2017 Creatinine1.31, BUN33, Potassium5.1, [id number], [id number]  06/12/2017 Creatinine1.44, BUN41, Potassium5.5, [id number], [id number]  05/29/2017 Creatinine1.59, BUN48, Potassium3.1, Sodium143, OACZ66-06  05/28/2017 Creatinine1.49, BUN41, Potassium3.3, [id number], [id number]  05/25/2017 Creatinine1.41, BUN20, Potassium2.8, GURKYH062, BJSE83-15 02/01/2017 Creatinine 1.44, BUN 23, Potassium 3.2. Sodium 146 08/15/2016 Creatinine 1.23, BUN 21, Potassium 3.6, Sodium 146  08/09/2016 Creatinine 1.36, BUN 21, Potassium 2.7, Sodium 143  07/10/2016 Creatinine 1.72, BUN 33, Potassium 5.1, Sodium 143  06/02/2016 Creatinine 1.44, BUN 20, Potassium 3.0, Sodium  144  Recommendations:  Encouraged to moderately increase fluids for the next couple of days.  Encouraged to call for fluid symptoms.  Follow-up plan: ICM clinic phone appointment on 06/21/2018.    Copy of ICM check sent to Dr. Rayann Heman.   3 month ICM trend: 05/30/2018    1 Year ICM trend:       Rosalene Billings, RN 05/30/2018 1:08 PM

## 2018-06-14 ENCOUNTER — Other Ambulatory Visit: Payer: Self-pay

## 2018-06-14 ENCOUNTER — Emergency Department (HOSPITAL_COMMUNITY): Payer: Medicare HMO

## 2018-06-14 ENCOUNTER — Inpatient Hospital Stay (HOSPITAL_COMMUNITY)
Admission: EM | Admit: 2018-06-14 | Discharge: 2018-06-16 | DRG: 191 | Disposition: A | Discharge status: Home / Self Care | Payer: Medicare HMO | Attending: Family Medicine | Admitting: Family Medicine

## 2018-06-14 DIAGNOSIS — Z885 Allergy status to narcotic agent status: Secondary | ICD-10-CM

## 2018-06-14 DIAGNOSIS — I2510 Atherosclerotic heart disease of native coronary artery without angina pectoris: Secondary | ICD-10-CM | POA: Diagnosis present

## 2018-06-14 DIAGNOSIS — I447 Left bundle-branch block, unspecified: Secondary | ICD-10-CM | POA: Diagnosis present

## 2018-06-14 DIAGNOSIS — I255 Ischemic cardiomyopathy: Secondary | ICD-10-CM | POA: Diagnosis present

## 2018-06-14 DIAGNOSIS — Z888 Allergy status to other drugs, medicaments and biological substances status: Secondary | ICD-10-CM

## 2018-06-14 DIAGNOSIS — Z951 Presence of aortocoronary bypass graft: Secondary | ICD-10-CM

## 2018-06-14 DIAGNOSIS — I480 Paroxysmal atrial fibrillation: Secondary | ICD-10-CM | POA: Diagnosis present

## 2018-06-14 DIAGNOSIS — F329 Major depressive disorder, single episode, unspecified: Secondary | ICD-10-CM | POA: Diagnosis present

## 2018-06-14 DIAGNOSIS — I252 Old myocardial infarction: Secondary | ICD-10-CM

## 2018-06-14 DIAGNOSIS — Z87442 Personal history of urinary calculi: Secondary | ICD-10-CM

## 2018-06-14 DIAGNOSIS — N183 Chronic kidney disease, stage 3 (moderate): Secondary | ICD-10-CM | POA: Diagnosis present

## 2018-06-14 DIAGNOSIS — J441 Chronic obstructive pulmonary disease with (acute) exacerbation: Secondary | ICD-10-CM | POA: Diagnosis not present

## 2018-06-14 DIAGNOSIS — I5022 Chronic systolic (congestive) heart failure: Secondary | ICD-10-CM | POA: Diagnosis present

## 2018-06-14 DIAGNOSIS — Z8673 Personal history of transient ischemic attack (TIA), and cerebral infarction without residual deficits: Secondary | ICD-10-CM

## 2018-06-14 DIAGNOSIS — Z823 Family history of stroke: Secondary | ICD-10-CM | POA: Diagnosis not present

## 2018-06-14 DIAGNOSIS — Z981 Arthrodesis status: Secondary | ICD-10-CM

## 2018-06-14 DIAGNOSIS — Z87891 Personal history of nicotine dependence: Secondary | ICD-10-CM

## 2018-06-14 DIAGNOSIS — Z95810 Presence of automatic (implantable) cardiac defibrillator: Secondary | ICD-10-CM

## 2018-06-14 DIAGNOSIS — I130 Hypertensive heart and chronic kidney disease with heart failure and stage 1 through stage 4 chronic kidney disease, or unspecified chronic kidney disease: Secondary | ICD-10-CM | POA: Diagnosis present

## 2018-06-14 DIAGNOSIS — N179 Acute kidney failure, unspecified: Secondary | ICD-10-CM | POA: Diagnosis present

## 2018-06-14 DIAGNOSIS — R0602 Shortness of breath: Secondary | ICD-10-CM

## 2018-06-14 DIAGNOSIS — Z86718 Personal history of other venous thrombosis and embolism: Secondary | ICD-10-CM | POA: Diagnosis not present

## 2018-06-14 DIAGNOSIS — Z9119 Patient's noncompliance with other medical treatment and regimen: Secondary | ICD-10-CM

## 2018-06-14 DIAGNOSIS — Z599 Problem related to housing and economic circumstances, unspecified: Secondary | ICD-10-CM | POA: Diagnosis not present

## 2018-06-14 DIAGNOSIS — E785 Hyperlipidemia, unspecified: Secondary | ICD-10-CM | POA: Diagnosis present

## 2018-06-14 DIAGNOSIS — F419 Anxiety disorder, unspecified: Secondary | ICD-10-CM | POA: Diagnosis present

## 2018-06-14 DIAGNOSIS — Z7982 Long term (current) use of aspirin: Secondary | ICD-10-CM

## 2018-06-14 LAB — BASIC METABOLIC PANEL
Anion gap: 11 (ref 5–15)
BUN: 20 mg/dL (ref 8–23)
CALCIUM: 8.7 mg/dL — AB (ref 8.9–10.3)
CHLORIDE: 106 mmol/L (ref 98–111)
CO2: 23 mmol/L (ref 22–32)
CREATININE: 1.35 mg/dL — AB (ref 0.61–1.24)
GFR calc non Af Amer: 50 mL/min — ABNORMAL LOW (ref >60)
GFR, EST AFRICAN AMERICAN: 58 mL/min — AB (ref >60)
Glucose, Bld: 100 mg/dL — ABNORMAL HIGH (ref 70–99)
Potassium: 4.1 mmol/L (ref 3.5–5.1)
SODIUM: 140 mmol/L (ref 135–145)

## 2018-06-14 LAB — CBC
HCT: 52.2 % — ABNORMAL HIGH (ref 39.0–52.0)
Hemoglobin: 16.7 g/dL (ref 13.0–17.0)
MCH: 31.3 pg (ref 26.0–34.0)
MCHC: 32 g/dL (ref 30.0–36.0)
MCV: 97.9 fL (ref 78.0–100.0)
PLATELETS: 197 10*3/uL (ref 150–400)
RBC: 5.33 MIL/uL (ref 4.22–5.81)
RDW: 14.2 % (ref 11.5–15.5)
WBC: 8.8 10*3/uL (ref 4.0–10.5)

## 2018-06-14 LAB — I-STAT TROPONIN, ED: TROPONIN I, POC: 0.01 ng/mL (ref 0.00–0.08)

## 2018-06-14 LAB — BRAIN NATRIURETIC PEPTIDE: B NATRIURETIC PEPTIDE 5: 170.6 pg/mL — AB (ref 0.0–100.0)

## 2018-06-14 MED ORDER — ALBUTEROL (5 MG/ML) CONTINUOUS INHALATION SOLN
20.0000 mg/h | INHALATION_SOLUTION | Freq: Once | RESPIRATORY_TRACT | Status: AC
  Administered 2018-06-14: 20 mg/h via RESPIRATORY_TRACT
  Filled 2018-06-14: qty 20

## 2018-06-14 MED ORDER — METHYLPREDNISOLONE SODIUM SUCC 125 MG IJ SOLR
125.0000 mg | Freq: Once | INTRAMUSCULAR | Status: AC
  Administered 2018-06-14: 125 mg via INTRAVENOUS
  Filled 2018-06-14: qty 2

## 2018-06-14 NOTE — ED Notes (Signed)
Patient transported to X-ray 

## 2018-06-14 NOTE — ED Triage Notes (Addendum)
Pt to ED from home with nine pound weight gain since this morning. Pt on 20 mg lasix. Pt c/o chest tightness and sob. 1 in Nitro paste and 324 ASA given PTA

## 2018-06-14 NOTE — ED Provider Notes (Signed)
[facility] EMERGENCY DEPARTMENT Provider Note   CSN: [id number] Arrival date & time: 06/14/18  1952  History   Chief Complaint Shortness of breath  HPI EDU ON is a 77 y.o. male.  HPI  Patient is a 77 y.o. male with complex medical history including COPD, CAD s/p CABG, CHF, and CKD presenting to the ED for dyspnea and chest tightness. He states he has had persistent chest tightness over the last 2-3 days with worsening shortness of breath at rest. Tightness is non-radiating. No vomiting or syncope. No recent abdominal pain or diarrhea. He reports adherence to his home medications. He also reports gaining 9 pounds on his home scale since this morning. No current URI symptoms.  Past Medical History:  Diagnosis Date  . Aortic dissection (HCC)    H/O focal aortic dissection  . CAD (coronary artery disease)    a. s/p CABG in 1998 with LIMA-LAD, SVG-D1, SVG-OM2-OM3, and SVG-RCA b. cath 10/07:  LM ok, LAD occluded, AV CFX 95%, pRCA occluded; L-LAD, S-OM2/OM3, S-PDA ok c. 2013: cath showing patent grafts  . Cerebrovascular disease    a. s/p prior Left CEA;  followed by Dr. [name]  . CHF (congestive heart failure) (HCC)    a. s/p STJ CRTD  . CKD (chronic kidney disease)   . COPD (chronic obstructive pulmonary disease) (HCC)   . Depression   . DVT (deep venous thrombosis) (HCC)   . History of stomach ulcers   . Hyperlipidemia   . Hypertension   . Iliac artery aneurysm, left (HCC)    followed by Dr. [name]  . Ischemic cardiomyopathy    a. s/p St. Jude CRTD placement in 2013  . LBBB (left bundle branch block) 2006  . Myocardial infarction (HCC) 1997  . Nephrolithiasis   . PAF (paroxysmal atrial fibrillation) (HCC) 2006   a. on amio and pradaxa. b. s/p AVN ablation  . Stroke [facility]) 12/2015 "several mini"   a. on coumadin --> switched to pradaxa b. s/p Watchman in 05/2016    Patient Active Problem List   Diagnosis Date Noted  . COPD exacerbation (HCC)  05/16/2018  . Stroke (HCC)   . Nephrolithiasis   . Ischemic cardiomyopathy   . Iliac artery aneurysm, left (HCC)   . Hypertension   . Hyperlipidemia   . History of stomach ulcers   . DVT (deep venous thrombosis) (HCC)   . CKD (chronic kidney disease)   . CHF (congestive heart failure) (HCC)   . CAD (coronary artery disease)   . Aortic dissection (HCC)   . Tobacco use disorder 05/29/2017  . Acute on chronic combined systolic and diastolic CHF (congestive heart failure) (HCC) 05/25/2017  . Hypokalemia 05/25/2017  . History of aortic aneurysm repair 04/10/2017  . Tachycardia-bradycardia syndrome (HCC)   . Cerebrovascular disease 05/04/2016  . NSVT (nonsustained ventricular tachycardia) (HCC) 03/15/2016  . Non compliance with medical treatment 03/15/2016  . AKI (acute kidney injury) (HCC) 03/12/2016  . GI bleed-Feb 2017 (being cnsidered for Watchman device) 01/21/2016  . Abdominal aortic aneurysm without rupture (HCC) 01/03/2016  . Carpal tunnel syndrome 09/09/2015  . CKD (chronic kidney disease), stage III (HCC) 03/04/2015  . HLD (hyperlipidemia) 03/01/2015  . BPPV (benign paroxysmal positional vertigo)   . Depression   . History of stroke-2005 12/23/2014  . Automatic implantable cardioverter-defibrillator in situ 08/26/2012  . Dizziness 06/24/2012  . Claudication (HCC) 04/15/2012  . COPD (chronic obstructive pulmonary disease) (HCC) 07/24/2011  . Long term current use of  anticoagulant-Pradaxa 02/21/2011  . CAD- CABG '98- cath 2007 and Aug 2013- Med Rx 10/12/2009  . ILIAC ARTERY ANEURYSM 10/12/2009  . Cardiomyopathy, ischemic-EF 25%  10/18/2008  . 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 Permanent atrial fibrillation (HCC) 10/18/2008  . PAF (paroxysmal atrial fibrillation) (HCC) 12/11/2004  . LBBB (left bundle branch block) 12/11/2004  . Myocardial infarction [facility]) 12/12/1995    Past Surgical History:  Procedure Laterality Date  . ABDOMINAL AORTIC ANEURYSM REPAIR N/A 01/03/2016     Procedure: ANEURYSM ABDOMINAL AORTA, BILATERAL COMMON ILIAC REPAIR;  Surgeon: [name], MD;  Location: [facility] OR;  Service: Vascular;  Laterality: N/A;  . ABDOMINAL AORTIC ANEURYSM REPAIR  01/03/2016  . APPENDECTOMY  09/11/11  . BACK SURGERY    . BI-VENTRICULAR IMPLANTABLE CARDIOVERTER DEFIBRILLATOR N/A 08/22/2012   SJM Quadra Assura BiV ICD implanted by Dr [name]  . CARDIAC CATHETERIZATION  1998; 2013  . CARDIOVERSION N/A 07/26/2016   Procedure: CARDIOVERSION;  Surgeon: Lars Masson, MD;  Location: [facility] ENDOSCOPY;  Service: Cardiovascular;  Laterality: N/A;  . CAROTID ENDARTERECTOMY Left July  2005  . CORONARY ARTERY BYPASS GRAFT  1998   CABG X5  . CYSTOSCOPY W/ STONE MANIPULATION  X 2  . ELECTROPHYSIOLOGIC STUDY N/A 08/17/2016   AVN ablation by Dr [name]  . ESOPHAGOGASTRODUODENOSCOPY N/A 02/28/2016   Procedure: ESOPHAGOGASTRODUODENOSCOPY (EGD);  Surgeon: [name], MD;  Location: [facility] ENDOSCOPY;  Service: Endoscopy;  Laterality: N/A;  . LEFT ATRIAL APPENDAGE OCCLUSION N/A 06/01/2016   Watchman device implanted by Dr [name]  . POSTERIOR FUSION LUMBAR SPINE  Sep 27, 2014   L4-L5  . PR VEIN BYPASS GRAFT,AORTO-FEM-POP  01/03/2016  . RIGHT HEART CATH N/A 05/28/2017   Procedure: Right Heart Cath;  Surgeon: Dolores Patty, MD;  Location: [facility] INVASIVE CV LAB;  Service: Cardiovascular;  Laterality: N/A;  . TEE WITHOUT CARDIOVERSION N/A 07/26/2016   Procedure: TRANSESOPHAGEAL ECHOCARDIOGRAM (TEE);  Surgeon: Lars Masson, MD;  Location: [facility] ENDOSCOPY;  Service: Cardiovascular;  Laterality: N/A;        Home Medications    Prior to Admission medications   Medication Sig Start Date End Date Taking? Authorizing Provider  albuterol (PROVENTIL HFA;VENTOLIN HFA) 108 (90 Base) MCG/ACT inhaler Inhale 2 puffs into the lungs every 6 (six) hours as needed for wheezing or shortness of breath. 09/25/17   Leslye Peer, MD  albuterol (PROVENTIL) (2.5 MG/3ML) 0.083% nebulizer solution Take 3  mLs (2.5 mg total) by nebulization every 4 (four) hours as needed for wheezing or shortness of breath. 05/18/18   Danford, Earl Lites, MD  amLODipine (NORVASC) 10 MG tablet Take 0.5 tablets (5 mg total) by mouth daily. 01/21/18   Lewayne Bunting, MD  aspirin EC 325 MG EC tablet Take 1 tablet (325 mg total) by mouth daily. 08/09/16   Gypsy Balsam K, NP  bisoprolol (ZEBETA) 5 MG tablet Take 0.5 tablets (2.5 mg total) by mouth daily. 01/23/18   Lewayne Bunting, MD  budesonide-formoterol (SYMBICORT) 160-4.5 MCG/ACT inhaler Inhale 2 puffs into the lungs 2 (two) times daily. 09/17/17   Leslye Peer, MD  citalopram (CELEXA) 20 MG tablet Take 20 mg by mouth daily. 04/23/18   [provider]  doxycycline (VIBRA-TABS) 100 MG tablet Take 1 tablet (100 mg total) by mouth every 12 (twelve) hours. 05/18/18   Alberteen Sam, MD  furosemide (LASIX) 20 MG tablet TAKE 2 TABLETS EVERY DAY 04/02/18   Lewayne Bunting, MD  guaiFENesin (MUCINEX) 600 MG 12 hr  tablet Take 1 tablet (600 mg total) by mouth 2 (two) times daily. 05/18/18   Danford, Earl Lites, MD  Investigational - Study Medication Take 1 tablet by mouth 2 (two) times daily. Study name: Galactic Heart Failure Research Study Additional study details: Omecamtiv Mecarbil or Placebo Patient taking differently: Take 1 tablet by mouth 2 (two) times daily. Study name: Galactic Heart Failure Research Study Additional study details: Hilario Quarry or Placebo Denman George [phone] 08/28/17   Laurey Morale, MD  KLOR-CON M20 20 MEQ tablet TAKE 2 TABLETS THREE TIMES DAILY. PLEASE SCHEDULE APPOINTMENT FOR REFILLS. Patient taking differently: TAKE 1 TABLETS twice a day 01/18/18   Lewayne Bunting, MD  Multiple Vitamin (MULITIVITAMIN WITH MINERALS) TABS Take 1 tablet by mouth daily.    [provider]  niacin (NIASPAN) 500 MG CR tablet Take 500 mg by mouth 2 (two) times daily.     [provider]  nitroGLYCERIN (NITROSTAT) 0.4 MG  SL tablet Place 1 tablet (0.4 mg total) under the tongue every 5 (five) minutes as needed for chest pain. X 3 doses 01/21/18   Lewayne Bunting, MD  pravastatin (PRAVACHOL) 80 MG tablet TAKE 1 TABLET EVERY DAY 04/02/18   Lewayne Bunting, MD  predniSONE (STERAPRED UNI-PAK 21 TAB) 10 MG (21) TBPK tablet Take prednisone taper as directed. 05/18/18   Danford, Earl Lites, MD  sacubitril-valsartan (ENTRESTO) 49-51 MG Take 1 tablet by mouth 2 (two) times daily. 12/13/17   Azalee Course, PA  spironolactone (ALDACTONE) 25 MG tablet TAKE 1 TABLET EVERY DAY 04/02/18   Lewayne Bunting, MD  tiotropium (SPIRIVA HANDIHALER) 18 MCG inhalation capsule Place 1 capsule (18 mcg total) into inhaler and inhale daily. 12/27/17 12/27/18  Leslye Peer, MD    Family History Family History  Problem Relation Age of Onset  . Stroke Maternal Uncle   . Stroke Maternal Uncle   . Heart attack Neg Hx   . Hypertension Neg Hx     Social History Social History   Tobacco Use  . Smoking status: Former Smoker    Packs/day: 0.50    Years: 48.00    Pack years: 24.00    Types: Cigarettes    Last attempt to quit: 05/24/2017    Years since quitting: 1.0  . Smokeless tobacco: Never Used  . Tobacco comment: quit 05/25/17  Substance Use Topics  . Alcohol use: Yes    Alcohol/week: 0.0 oz    Comment: 06/01/2016 "haven't had a drink in years"  . Drug use: No     Allergies   Buprenorphine hcl; Morphine and related; and Other   Review of Systems Review of Systems  Constitutional: Negative for fever.  HENT: Negative for congestion.   Eyes: Negative for visual disturbance.  Respiratory: Positive for cough (nonproductive) and shortness of breath.   Cardiovascular: Negative for chest pain (tightness only, denies "pain").  Gastrointestinal: Negative for abdominal pain, diarrhea and vomiting.  Genitourinary: Negative for dysuria.  Musculoskeletal: Negative for back pain.  Skin: Negative for rash.  Neurological: Negative for  headaches.  All other systems reviewed and are negative.    Physical Exam Updated Vital Signs BP 113/69   Pulse 72   Temp 97.9 F (36.6 C) (Oral)   Resp (!) 25   Ht 6\' 1"  (1.854 m)   Wt 86.6 kg (191 lb)   SpO2 95%   BMI 25.20 kg/m   Physical Exam  Constitutional: He is oriented to person, place, and time. No distress.  HENT:  Head: Normocephalic and atraumatic.  Mouth/Throat: Oropharynx is clear and moist.  Eyes: Pupils are equal, round, and reactive to light.  Neck: Neck supple. No JVD present.  Cardiovascular: Normal rate, regular rhythm, normal heart sounds and intact distal pulses.  No murmur heard. Pulmonary/Chest: Tachypnea noted. No respiratory distress. He has decreased breath sounds (throughout). He has wheezes. He has no rales.  Abdominal: Soft. He exhibits no distension and no mass. There is no tenderness. There is no guarding.  Musculoskeletal: Normal range of motion. He exhibits no edema.  Neurological: He is alert and oriented to person, place, and time.  Skin: Skin is warm and dry.  Psychiatric: He has a normal mood and affect. His behavior is normal.  Nursing note and vitals reviewed.    ED Treatments / Results  Labs (all labs ordered are listed, but only abnormal results are displayed) Labs Reviewed  BASIC METABOLIC PANEL  CBC  BRAIN NATRIURETIC PEPTIDE  I-STAT TROPONIN, ED    EKG EKG Interpretation  Date/Time:  Friday June 14 2018 20:04:40 EDT Ventricular Rate:  72 PR Interval:    QRS Duration: 160 QT Interval:  482 QTC Calculation: 528 R Axis:   -96 Text Interpretation:  Sinus or ectopic atrial rhythm Ventricular premature complex Short PR interval Right bundle branch block Inferior infarct, old Anterior infarct, old Confirmed by Blane Ohara [phone]) on 06/14/2018 8:42:07 PM Also confirmed by Blane Ohara [phone]), editor Josephine Igo [id number])  on 06/15/2018 11:28:19 AM   Radiology Dg Chest 2 View  Result Date: 06/14/2018 CLINICAL  DATA:  Acute chest pain and shortness of breath today. EXAM: CHEST - 2 VIEW COMPARISON:  05/16/2018 and prior radiographs FINDINGS: Cardiomegaly, CABG changes and LEFT AICD/pacemaker again noted. COPD/emphysema type changes again noted. There is no evidence of focal airspace disease, pulmonary edema, suspicious pulmonary nodule/mass, pleural effusion, or pneumothorax. No acute bony abnormalities are identified. IMPRESSION: No evidence of acute cardiopulmonary disease. Cardiomegaly and COPD/emphysema type changes. Electronically Signed   By: Harmon Pier M.D.   On: 06/14/2018 20:35    Procedures Procedures (including critical care time)  Medications Ordered in ED Medications - No data to display   Initial Impression / Assessment and Plan / ED Course  I have reviewed the triage vital signs and the nursing notes.  Pertinent labs & imaging results that were available during my care of the patient were reviewed by me and considered in my medical decision making (see chart for details).  Clinical picture most consistent with COPD exacerbation. Diminished air exchange with wheezing on arrival. He was given bronchodilators and steroids with improvement. No change in sputum volume or quality to indicate antibiotics. Chest tightness likely due to cough. EKG unchanged. We did send troponin given his cardiac history which was negative. Description not concerning for PE or dissection. No signs of volume overload on exam. POCUS TTE and lungs show no pericardial effusion and no pulmonary edema. CXR is clear. Patient admitted to hospitalist for further management.  Final Clinical Impressions(s) / ED Diagnoses   Final diagnoses:  Shortness of breath  COPD exacerbation (HCC)     Cecille Po, MD 06/15/18 1210    Blane Ohara, MD 06/15/18 1517    Blane Ohara, MD 06/16/18 1627

## 2018-06-15 ENCOUNTER — Encounter (HOSPITAL_COMMUNITY): Payer: Self-pay | Admitting: Internal Medicine

## 2018-06-15 DIAGNOSIS — I2510 Atherosclerotic heart disease of native coronary artery without angina pectoris: Secondary | ICD-10-CM

## 2018-06-15 DIAGNOSIS — J441 Chronic obstructive pulmonary disease with (acute) exacerbation: Principal | ICD-10-CM

## 2018-06-15 DIAGNOSIS — I480 Paroxysmal atrial fibrillation: Secondary | ICD-10-CM

## 2018-06-15 LAB — COMPREHENSIVE METABOLIC PANEL
ALT: 14 U/L (ref 0–44)
AST: 25 U/L (ref 15–41)
Albumin: 3.4 g/dL — ABNORMAL LOW (ref 3.5–5.0)
Alkaline Phosphatase: 71 U/L (ref 38–126)
Anion gap: 15 (ref 5–15)
BUN: 24 mg/dL — ABNORMAL HIGH (ref 8–23)
CHLORIDE: 104 mmol/L (ref 98–111)
CO2: 20 mmol/L — AB (ref 22–32)
CREATININE: 1.85 mg/dL — AB (ref 0.61–1.24)
Calcium: 8.8 mg/dL — ABNORMAL LOW (ref 8.9–10.3)
GFR calc non Af Amer: 34 mL/min — ABNORMAL LOW (ref >60)
GFR, EST AFRICAN AMERICAN: 39 mL/min — AB (ref >60)
Glucose, Bld: 261 mg/dL — ABNORMAL HIGH (ref 70–99)
Potassium: 3.6 mmol/L (ref 3.5–5.1)
SODIUM: 139 mmol/L (ref 135–145)
Total Bilirubin: 0.7 mg/dL (ref 0.3–1.2)
Total Protein: 6.1 g/dL — ABNORMAL LOW (ref 6.5–8.1)

## 2018-06-15 LAB — URINALYSIS, ROUTINE W REFLEX MICROSCOPIC
Bilirubin Urine: NEGATIVE
GLUCOSE, UA: NEGATIVE mg/dL
Hgb urine dipstick: NEGATIVE
Ketones, ur: NEGATIVE mg/dL
Leukocytes, UA: NEGATIVE
Nitrite: NEGATIVE
PH: 5 (ref 5.0–8.0)
PROTEIN: NEGATIVE mg/dL
Specific Gravity, Urine: 1.016 (ref 1.005–1.030)

## 2018-06-15 LAB — RESPIRATORY PANEL BY PCR
Adenovirus: NOT DETECTED
BORDETELLA PERTUSSIS-RVPCR: NOT DETECTED
CHLAMYDOPHILA PNEUMONIAE-RVPPCR: NOT DETECTED
Coronavirus 229E: NOT DETECTED
Coronavirus HKU1: NOT DETECTED
Coronavirus NL63: NOT DETECTED
Coronavirus OC43: NOT DETECTED
INFLUENZA A-RVPPCR: NOT DETECTED
INFLUENZA B-RVPPCR: NOT DETECTED
METAPNEUMOVIRUS-RVPPCR: NOT DETECTED
Mycoplasma pneumoniae: NOT DETECTED
PARAINFLUENZA VIRUS 3-RVPPCR: NOT DETECTED
Parainfluenza Virus 1: NOT DETECTED
Parainfluenza Virus 2: NOT DETECTED
Parainfluenza Virus 4: NOT DETECTED
RESPIRATORY SYNCYTIAL VIRUS-RVPPCR: NOT DETECTED
RHINOVIRUS / ENTEROVIRUS - RVPPCR: NOT DETECTED

## 2018-06-15 LAB — CBC
HEMATOCRIT: 51.3 % (ref 39.0–52.0)
Hemoglobin: 16.5 g/dL (ref 13.0–17.0)
MCH: 31.7 pg (ref 26.0–34.0)
MCHC: 32.2 g/dL (ref 30.0–36.0)
MCV: 98.5 fL (ref 78.0–100.0)
PLATELETS: 184 10*3/uL (ref 150–400)
RBC: 5.21 MIL/uL (ref 4.22–5.81)
RDW: 14.3 % (ref 11.5–15.5)
WBC: 7.3 10*3/uL (ref 4.0–10.5)

## 2018-06-15 LAB — POCT I-STAT TROPONIN I: TROPONIN I, POC: 0 ng/mL (ref 0.00–0.08)

## 2018-06-15 LAB — I-STAT VENOUS BLOOD GAS, ED
ACID-BASE DEFICIT: 1 mmol/L (ref 0.0–2.0)
BICARBONATE: 25.3 mmol/L (ref 20.0–28.0)
O2 SAT: 67 %
PH VEN: 7.356 (ref 7.250–7.430)
PO2 VEN: 37 mmHg (ref 32.0–45.0)
TCO2: 27 mmol/L (ref 22–32)
pCO2, Ven: 45.2 mmHg (ref 44.0–60.0)

## 2018-06-15 MED ORDER — TIOTROPIUM BROMIDE MONOHYDRATE 18 MCG IN CAPS
18.0000 ug | ORAL_CAPSULE | Freq: Every day | RESPIRATORY_TRACT | Status: DC
  Filled 2018-06-15 (×2): qty 5

## 2018-06-15 MED ORDER — PREDNISONE 20 MG PO TABS
40.0000 mg | ORAL_TABLET | Freq: Every day | ORAL | Status: DC
  Administered 2018-06-16: 40 mg via ORAL
  Filled 2018-06-15: qty 2

## 2018-06-15 MED ORDER — ASPIRIN EC 325 MG PO TBEC
325.0000 mg | DELAYED_RELEASE_TABLET | Freq: Every day | ORAL | Status: DC
  Administered 2018-06-15 – 2018-06-16 (×2): 325 mg via ORAL
  Filled 2018-06-15 (×2): qty 1

## 2018-06-15 MED ORDER — ACETAMINOPHEN 650 MG RE SUPP: 650.0000 mg | Freq: Four times a day (QID) | RECTAL | Status: DC | PRN

## 2018-06-15 MED ORDER — FUROSEMIDE 40 MG PO TABS
40.0000 mg | ORAL_TABLET | Freq: Every day | ORAL | Status: DC
  Administered 2018-06-15: 40 mg via ORAL
  Filled 2018-06-15: qty 1

## 2018-06-15 MED ORDER — ALBUTEROL SULFATE (2.5 MG/3ML) 0.083% IN NEBU: 2.5000 mg | INHALATION_SOLUTION | RESPIRATORY_TRACT | Status: DC | PRN

## 2018-06-15 MED ORDER — SODIUM CHLORIDE 0.9% FLUSH
3.0000 mL | Freq: Two times a day (BID) | INTRAVENOUS | Status: DC
  Administered 2018-06-15 – 2018-06-16 (×4): 3 mL via INTRAVENOUS

## 2018-06-15 MED ORDER — DOXYCYCLINE HYCLATE 100 MG PO TABS
100.0000 mg | ORAL_TABLET | Freq: Two times a day (BID) | ORAL | Status: DC
  Administered 2018-06-15 – 2018-06-16 (×2): 100 mg via ORAL
  Filled 2018-06-15 (×3): qty 1

## 2018-06-15 MED ORDER — ENOXAPARIN SODIUM 40 MG/0.4ML ~~LOC~~ SOLN
40.0000 mg | SUBCUTANEOUS | Status: DC
  Administered 2018-06-15: 40 mg via SUBCUTANEOUS
  Filled 2018-06-15: qty 0.4

## 2018-06-15 MED ORDER — METHYLPREDNISOLONE SODIUM SUCC 40 MG IJ SOLR
40.0000 mg | Freq: Two times a day (BID) | INTRAMUSCULAR | Status: AC
  Administered 2018-06-15 (×2): 40 mg via INTRAVENOUS
  Filled 2018-06-15 (×2): qty 1

## 2018-06-15 MED ORDER — CITALOPRAM HYDROBROMIDE 10 MG PO TABS
10.0000 mg | ORAL_TABLET | Freq: Every day | ORAL | Status: DC
  Administered 2018-06-15 – 2018-06-16 (×2): 10 mg via ORAL
  Filled 2018-06-15 (×2): qty 1

## 2018-06-15 MED ORDER — ONDANSETRON HCL 4 MG/2ML IJ SOLN
4.0000 mg | Freq: Four times a day (QID) | INTRAMUSCULAR | Status: DC | PRN
  Administered 2018-06-15: 4 mg via INTRAVENOUS
  Filled 2018-06-15: qty 2

## 2018-06-15 MED ORDER — PRAVASTATIN SODIUM 40 MG PO TABS
80.0000 mg | ORAL_TABLET | Freq: Every day | ORAL | Status: DC
  Administered 2018-06-15 – 2018-06-16 (×2): 80 mg via ORAL
  Filled 2018-06-15 (×2): qty 2

## 2018-06-15 MED ORDER — ENOXAPARIN SODIUM 40 MG/0.4ML ~~LOC~~ SOLN
40.0000 mg | SUBCUTANEOUS | Status: DC
  Administered 2018-06-16: 40 mg via SUBCUTANEOUS
  Filled 2018-06-15: qty 0.4

## 2018-06-15 MED ORDER — BISOPROLOL FUMARATE 5 MG PO TABS
2.5000 mg | ORAL_TABLET | Freq: Every day | ORAL | Status: DC
  Administered 2018-06-15 – 2018-06-16 (×2): 2.5 mg via ORAL
  Filled 2018-06-15 (×2): qty 1

## 2018-06-15 MED ORDER — ACETAMINOPHEN 325 MG PO TABS: 650.0000 mg | ORAL_TABLET | Freq: Four times a day (QID) | ORAL | Status: DC | PRN

## 2018-06-15 MED ORDER — SACUBITRIL-VALSARTAN 49-51 MG PO TABS
1.0000 | ORAL_TABLET | Freq: Two times a day (BID) | ORAL | Status: DC
  Administered 2018-06-15 – 2018-06-16 (×3): 1 via ORAL
  Filled 2018-06-15 (×3): qty 1

## 2018-06-15 MED ORDER — POTASSIUM CHLORIDE CRYS ER 20 MEQ PO TBCR
20.0000 meq | EXTENDED_RELEASE_TABLET | Freq: Two times a day (BID) | ORAL | Status: DC
  Administered 2018-06-15 – 2018-06-16 (×3): 20 meq via ORAL
  Filled 2018-06-15 (×3): qty 1

## 2018-06-15 MED ORDER — IPRATROPIUM-ALBUTEROL 0.5-2.5 (3) MG/3ML IN SOLN
3.0000 mL | Freq: Four times a day (QID) | RESPIRATORY_TRACT | Status: DC
  Administered 2018-06-15 – 2018-06-16 (×3): 3 mL via RESPIRATORY_TRACT
  Filled 2018-06-15 (×3): qty 3

## 2018-06-15 MED ORDER — ADULT MULTIVITAMIN W/MINERALS CH
1.0000 | ORAL_TABLET | Freq: Every day | ORAL | Status: DC
  Administered 2018-06-15 – 2018-06-16 (×2): 1 via ORAL
  Filled 2018-06-15 (×2): qty 1

## 2018-06-15 MED ORDER — LACTATED RINGERS IV SOLN
INTRAVENOUS | Status: AC
  Administered 2018-06-15: 17:00:00 via INTRAVENOUS

## 2018-06-15 MED ORDER — SPIRONOLACTONE 25 MG PO TABS
25.0000 mg | ORAL_TABLET | Freq: Every day | ORAL | Status: DC
  Administered 2018-06-15: 25 mg via ORAL
  Filled 2018-06-15: qty 1

## 2018-06-15 MED ORDER — STUDY - INVESTIGATIONAL MEDICATION
1.0000 | Freq: Two times a day (BID) | Status: DC
  Administered 2018-06-15: 1 via ORAL
  Filled 2018-06-15 (×3): qty 1

## 2018-06-15 MED ORDER — NIACIN ER (ANTIHYPERLIPIDEMIC) 500 MG PO TBCR
500.0000 mg | EXTENDED_RELEASE_TABLET | Freq: Two times a day (BID) | ORAL | Status: DC
  Administered 2018-06-15 – 2018-06-16 (×3): 500 mg via ORAL
  Filled 2018-06-15 (×3): qty 1

## 2018-06-15 MED ORDER — SODIUM CHLORIDE 0.9 % IV SOLN: 250.0000 mL | INTRAVENOUS | Status: DC | PRN

## 2018-06-15 MED ORDER — NITROGLYCERIN 0.4 MG SL SUBL: 0.4000 mg | SUBLINGUAL_TABLET | SUBLINGUAL | Status: DC | PRN

## 2018-06-15 MED ORDER — AMLODIPINE BESYLATE 5 MG PO TABS
5.0000 mg | ORAL_TABLET | Freq: Every day | ORAL | Status: DC
  Administered 2018-06-15 – 2018-06-16 (×2): 5 mg via ORAL
  Filled 2018-06-15 (×2): qty 1

## 2018-06-15 MED ORDER — SODIUM CHLORIDE 0.9% FLUSH: 3.0000 mL | INTRAVENOUS | Status: DC | PRN

## 2018-06-15 NOTE — Progress Notes (Signed)
PROGRESS NOTE    [name] [name]  PFX:[id number] DOB: [birth year] DOA: 06/14/2018 PCP: [name] [name]., MD   Brief Narrative:  [name] [name]  is a 77 y.o. male, w h/o stroke, L CEA, Pafib, Aortic dissection, CAD s/p CABG, CHF (EF 20-25%)  Copd not on home o2 apparently c/o dyspnea since last nite 11pm as well as dry cough starting this am.  Pt felt tightness in his chest which is typical of his Copd exacerbation and due to increase in dyspnea presented to ED.  Pt notes that he can not afford his inhalers and has not been using them over the past 3 weeks.  Pt denies fever, chills, cp, palp, orthopnea, pnd, lower ext edema,   Assessment & Plan:   Principal Problem:   COPD exacerbation (HCC) Active Problems:   PAF (paroxysmal atrial fibrillation) (HCC)   CAD (coronary artery disease)   Shortness of breath 2/2 Copd exacerbation Solumedrol -> prednisone tomorrow Doxycycline x 5 days Duonebs q6 with albuterol prn Care management for medication assistance (pt tells me he was compliant, but per H&P, he'd been unable to obtain some meds recently - family does note that his symbicort was d/c'd in May) Plan to d/c on spiriva/symbicort/albuterol  Follow RVP  CHF (EF 20%) s/p pacemaker/defibrillator Cont Entresto Hold lasix and spironolactone given AKI Cont Zebeta 2.5mg  poq day Cont Amlodipine 5mg  po qday Study med  Acute Kidney Injury:  Baseline creatinine around 1.3.  Bumped to ~1.8 today.  Appears euvolemic on exam.  Hold lasix, spironolactone.  Continue entresto for now. Follow UA. Gentle IVF  Atrial Fibrillation: s/p watchman procedure  CAD s/p CABG Cont Aspirin 325mg  po qday Cotn Zebeta as above Cont Pravastatin 80mg  po qhs  Anxiety Cont Celexa 10mg  po qday  DVT prophylaxis: lovenox Code Status: full Family Communication: daughter Disposition Plan: pending improvement in resp status    Consultants:   none  Procedures:   none  Antimicrobials:    Anti-infectives (From admission, onward)   Start     Dose/Rate Route Frequency Ordered Stop   06/15/18 1630  doxycycline (VIBRA-TABS) tablet 100 mg     100 mg Oral Every 12 hours 06/15/18 1620 06/20/18 2159     Subjective: SOB started a day or so ago. Frustrated, wonders why this has happened again.  Objective: Vitals:   06/15/18 0030 06/15/18 0106 06/15/18 0506 06/15/18 1212  BP: 123/89 122/81 108/64 (!) 100/44  Pulse: 70 74 78 80  Resp: 20 20 20 20   Temp:  97.6 F (36.4 C) (!) 97.4 F (36.3 C) 98.4 F (36.9 C)  TempSrc:  Oral Oral Oral  SpO2: 94% 94% 97% 98%  Weight:  87.3 kg (192 lb 8 oz)    Height:  6\' 1"  (1.854 m)      Intake/Output Summary (Last 24 hours) at 06/15/2018 1552 Last data filed at 06/15/2018 1437 Gross per 24 hour  Intake 486 ml  Output 750 ml  Net -264 ml   Filed Weights   06/14/18 1956 06/15/18 0106  Weight: 86.6 kg (191 lb) 87.3 kg (192 lb 8 oz)    Examination:  General exam: Appears calm and comfortable  Respiratory system: decreased breath sounds, scattered wheezes Cardiovascular system: S1 & S2 heard, RRR.  Gastrointestinal system: Abdomen is nondistended, soft and nontender.  Central nervous system: Alert and oriented. No focal neurological deficits. Extremities: Symmetric 5 x 5 power. Skin: No rashes, lesions or ulcers Psychiatry: Judgement and insight appear normal. Mood & affect appropriate.  Data Reviewed: I have personally reviewed following labs and imaging studies  CBC: Recent Labs  Lab 06/14/18 2007 06/15/18 0501  WBC 8.8 7.3  HGB 16.7 16.5  HCT 52.2* 51.3  MCV 97.9 98.5  PLT 197 184   Basic Metabolic Panel: Recent Labs  Lab 06/14/18 2007 06/15/18 0501  NA 140 139  K 4.1 3.6  CL 106 104  CO2 23 20*  GLUCOSE 100* 261*  BUN 20 24*  CREATININE 1.35* 1.85*  CALCIUM 8.7* 8.8*   GFR: Estimated Creatinine Clearance: 39 mL/min (A) (by C-G formula based on SCr of 1.85 mg/dL (H)). Liver Function Tests: Recent  Labs  Lab 06/15/18 0501  AST 25  ALT 14  ALKPHOS 71  BILITOT 0.7  PROT 6.1*  ALBUMIN 3.4*   No results for input(s): LIPASE, AMYLASE in the last 168 hours. No results for input(s): AMMONIA in the last 168 hours. Coagulation Profile: No results for input(s): INR, PROTIME in the last 168 hours. Cardiac Enzymes: No results for input(s): CKTOTAL, CKMB, CKMBINDEX, TROPONINI in the last 168 hours. BNP (last 3 results) No results for input(s): PROBNP in the last 8760 hours. HbA1C: No results for input(s): HGBA1C in the last 72 hours. CBG: No results for input(s): GLUCAP in the last 168 hours. Lipid Profile: No results for input(s): CHOL, HDL, LDLCALC, TRIG, CHOLHDL, LDLDIRECT in the last 72 hours. Thyroid Function Tests: No results for input(s): TSH, T4TOTAL, FREET4, T3FREE, THYROIDAB in the last 72 hours. Anemia Panel: No results for input(s): VITAMINB12, FOLATE, FERRITIN, TIBC, IRON, RETICCTPCT in the last 72 hours. Sepsis Labs: No results for input(s): PROCALCITON, LATICACIDVEN in the last 168 hours.  Recent Results (from the past 240 hour(s))  Respiratory Panel by PCR     Status: None   Collection Time: 06/15/18 12:16 PM  Result Value Ref Range Status   Adenovirus NOT DETECTED NOT DETECTED Final   Coronavirus 229E NOT DETECTED NOT DETECTED Final   Coronavirus HKU1 NOT DETECTED NOT DETECTED Final   Coronavirus NL63 NOT DETECTED NOT DETECTED Final   Coronavirus OC43 NOT DETECTED NOT DETECTED Final   Metapneumovirus NOT DETECTED NOT DETECTED Final   Rhinovirus / Enterovirus NOT DETECTED NOT DETECTED Final   Influenza A NOT DETECTED NOT DETECTED Final   Influenza B NOT DETECTED NOT DETECTED Final   Parainfluenza Virus 1 NOT DETECTED NOT DETECTED Final   Parainfluenza Virus 2 NOT DETECTED NOT DETECTED Final   Parainfluenza Virus 3 NOT DETECTED NOT DETECTED Final   Parainfluenza Virus 4 NOT DETECTED NOT DETECTED Final   Respiratory Syncytial Virus NOT DETECTED NOT DETECTED  Final   Bordetella pertussis NOT DETECTED NOT DETECTED Final   Chlamydophila pneumoniae NOT DETECTED NOT DETECTED Final   Mycoplasma pneumoniae NOT DETECTED NOT DETECTED Final    Comment: Performed at [facility], [address], [place], [place] [address]         Radiology Studies: Dg Chest 2 View  Result Date: 06/14/2018 CLINICAL DATA:  Acute chest pain and shortness of breath today. EXAM: CHEST - 2 VIEW COMPARISON:  05/16/2018 and prior radiographs FINDINGS: Cardiomegaly, CABG changes and LEFT AICD/pacemaker again noted. COPD/emphysema type changes again noted. There is no evidence of focal airspace disease, pulmonary edema, suspicious pulmonary nodule/mass, pleural effusion, or pneumothorax. No acute bony abnormalities are identified. IMPRESSION: No evidence of acute cardiopulmonary disease. Cardiomegaly and COPD/emphysema type changes. Electronically Signed   By: Harmon Pier M.D.   On: 06/14/2018 20:35  Scheduled Meds: . amLODipine  5 mg Oral Daily  . aspirin  325 mg Oral Daily  . bisoprolol  2.5 mg Oral Daily  . citalopram  10 mg Oral Daily  . enoxaparin (LOVENOX) injection  40 mg Subcutaneous Q24H  . Investigational - Study Medication  1 tablet Oral BID  . methylPREDNISolone (SOLU-MEDROL) injection  40 mg Intravenous Q12H  . multivitamin with minerals  1 tablet Oral Daily  . niacin  500 mg Oral BID  . potassium chloride SA  20 mEq Oral BID  . pravastatin  80 mg Oral Daily  . sacubitril-valsartan  1 tablet Oral BID  . sodium chloride flush  3 mL Intravenous Q12H  . tiotropium  18 mcg Inhalation Daily   Continuous Infusions: . sodium chloride    . lactated ringers       LOS: 1 day    Time spent: over 30 min    Lacretia Nicks, MD [facility] Pager [phone]  If 7PM-7AM, please contact night-coverage [email or web address] Password [email or web address] 06/15/2018, 3:52 PM

## 2018-06-15 NOTE — Progress Notes (Signed)
Patient ambulated entire hallway.  Oxygen saturation 95-98% on room air.  Patient did experience some shortness of breath and had to take several breaks.

## 2018-06-15 NOTE — H&P (Signed)
TRH H&P   Patient Demographics:    [name] [name], is a 77 y.o. male  MRN: [id number]   DOB - [birth year]  Admit Date - 06/14/2018  Outpatient Primary MD for the patient is [name], MD  Referring MD/NP/PA:  Blane Ohara  Outpatient Specialists:   Patient coming from: home  No chief complaint on file.  Dyspnea   HPI:    [name] [name]  is a 77 y.o. male, w h/o stroke, L CEA, Pafib, Aortic dissection, CAD s/p CABG, CHF (EF 20-25%)  Copd not on home o2 apparently c/o dyspnea since last nite 11pm as well as dry cough starting this am.  Pt felt tightness in his chest which is typical of his Copd exacerbation and due to increase in dyspnea presented to ED.  Pt notes that he can not afford his inhalers and has not been using them over the past 3 weeks.  Pt denies fever, chills, cp, palp, orthopnea, pnd, lower ext edema,   In Ed,  Pt received albuterol continuous neb without relief.   CXR IMPRESSION: No evidence of acute cardiopulmonary disease. Cardiomegaly and COPD/emphysema type changes.  Ekg nsr at 75, LAD, RBBB, Q 2,3, avf, v4-6  Na 140, K 4.1, Bun 20, Creatinine 1.35 Glucose 100  Wbc 8.8, Hgb 16.7, Plt 197 BNP 170.6  Trop 0.01  Pt will be admitted for dyspnea related to Copd exacerbation secondary to noncompliance due to cost of medication.    Review of systems:    In addition to the HPI above, No Fever-chills, No Headache, No changes with Vision or hearing, No problems swallowing food or Liquids, No Chest pain,  No Abdominal pain, No Nausea or Vommitting, Bowel movements are regular, No Blood in stool or Urine, No dysuria, No new skin rashes or bruises, No new joints pains-aches,  No new weakness, tingling, numbness in any extremity, No recent weight gain or loss, No polyuria, polydypsia or polyphagia, No significant Mental Stressors.  A full 10  point Review of Systems was done, except as stated above, all other Review of Systems were negative.   With Past History of the following :    Past Medical History:  Diagnosis Date  . Aortic dissection (HCC)    H/O focal aortic dissection  . CAD (coronary artery disease)    a. s/p CABG in 1998 with LIMA-LAD, SVG-D1, SVG-OM2-OM3, and SVG-RCA b. cath 10/07:  LM ok, LAD occluded, AV CFX 95%, pRCA occluded; L-LAD, S-OM2/OM3, S-PDA ok c. 2013: cath showing patent grafts  . Cerebrovascular disease    a. s/p prior Left CEA;  followed by Dr. [name]  . CHF (congestive heart failure) (HCC)    a. s/p STJ CRTD  . CKD (chronic kidney disease)   . COPD (chronic obstructive pulmonary disease) (HCC)   . Depression   . DVT (deep venous thrombosis) (HCC)   . History of  stomach ulcers   . Hyperlipidemia   . Hypertension   . Iliac artery aneurysm, left (HCC)    followed by Dr. [name]  . Ischemic cardiomyopathy    a. s/p St. Jude CRTD placement in 2013  . LBBB (left bundle branch block) 2006  . Myocardial infarction (HCC) 1997  . Nephrolithiasis   . PAF (paroxysmal atrial fibrillation) (HCC) 2006   a. on amio and pradaxa. b. s/p AVN ablation  . Stroke [facility]) 12/2015 "several mini"   a. on coumadin --> switched to pradaxa b. s/p Watchman in 05/2016      Past Surgical History:  Procedure Laterality Date  . ABDOMINAL AORTIC ANEURYSM REPAIR N/A 01/03/2016   Procedure: ANEURYSM ABDOMINAL AORTA, BILATERAL COMMON ILIAC REPAIR;  Surgeon: [name], MD;  Location: [facility] OR;  Service: Vascular;  Laterality: N/A;  . ABDOMINAL AORTIC ANEURYSM REPAIR  01/03/2016  . APPENDECTOMY  09/11/11  . BACK SURGERY    . BI-VENTRICULAR IMPLANTABLE CARDIOVERTER DEFIBRILLATOR N/A 08/22/2012   SJM Quadra Assura BiV ICD implanted by Dr [name]  . CARDIAC CATHETERIZATION  1998; 2013  . CARDIOVERSION N/A 07/26/2016   Procedure: CARDIOVERSION;  Surgeon: Lars Masson, MD;  Location: [facility] ENDOSCOPY;  Service: Cardiovascular;   Laterality: N/A;  . CAROTID ENDARTERECTOMY Left July  2005  . CORONARY ARTERY BYPASS GRAFT  1998   CABG X5  . CYSTOSCOPY W/ STONE MANIPULATION  X 2  . ELECTROPHYSIOLOGIC STUDY N/A 08/17/2016   AVN ablation by Dr [name]  . ESOPHAGOGASTRODUODENOSCOPY N/A 02/28/2016   Procedure: ESOPHAGOGASTRODUODENOSCOPY (EGD);  Surgeon: [name], MD;  Location[facility] ENDOSCOPY;  Service: Endoscopy;  Laterality: N/A;  . LEFT ATRIAL APPENDAGE OCCLUSION N/A 06/01/2016   Watchman device implanted by Dr [name]  . POSTERIOR FUSION LUMBAR SPINE  Sep 27, 2014   L4-L5  . PR VEIN BYPASS GRAFT,AORTO-FEM-POP  01/03/2016  . RIGHT HEART CATH N/A 05/28/2017   Procedure: Right Heart Cath;  Surgeon: Dolores Patty, MD;  Location: [facility] INVASIVE CV LAB;  Service: Cardiovascular;  Laterality: N/A;  . TEE WITHOUT CARDIOVERSION N/A 07/26/2016   Procedure: TRANSESOPHAGEAL ECHOCARDIOGRAM (TEE);  Surgeon: Lars Masson, MD;  Location: [facility] ENDOSCOPY;  Service: Cardiovascular;  Laterality: N/A;      Social History:     Social History   Tobacco Use  . Smoking status: Former Smoker    Packs/day: 0.50    Years: 48.00    Pack years: 24.00    Types: Cigarettes    Last attempt to quit: 05/24/2017    Years since quitting: 1.0  . Smokeless tobacco: Never Used  . Tobacco comment: quit 05/25/17  Substance Use Topics  . Alcohol use: Yes    Alcohol/week: 0.0 oz    Comment: 06/01/2016 "haven't had a drink in years"     Lives - at home  Mobility - walks by self   Family History :     Family History  Problem Relation Age of Onset  . Stroke Maternal Uncle   . Stroke Maternal Uncle   . Heart attack Neg Hx   . Hypertension Neg Hx        Home Medications:   Prior to Admission medications   Medication Sig Start Date End Date Taking? Authorizing Provider  albuterol (PROVENTIL HFA;VENTOLIN HFA) 108 (90 Base) MCG/ACT inhaler Inhale 2 puffs into the lungs every 6 (six) hours as needed for wheezing or shortness of breath.  09/25/17  Yes Leslye Peer, MD  albuterol (PROVENTIL) (2.5 MG/3ML) 0.083%  nebulizer solution Take 3 mLs (2.5 mg total) by nebulization every 4 (four) hours as needed for wheezing or shortness of breath. 05/18/18  Yes Danford, Earl Lites, MD  amLODipine (NORVASC) 10 MG tablet Take 0.5 tablets (5 mg total) by mouth daily. 01/21/18  Yes Lewayne Bunting, MD  aspirin EC 325 MG EC tablet Take 1 tablet (325 mg total) by mouth daily. 08/09/16  Yes Seiler, Amber K, NP  bisoprolol (ZEBETA) 5 MG tablet Take 0.5 tablets (2.5 mg total) by mouth daily. 01/23/18  Yes Lewayne Bunting, MD  citalopram (CELEXA) 20 MG tablet Take 10 mg by mouth daily.  04/23/18  Yes [provider]  furosemide (LASIX) 20 MG tablet TAKE 2 TABLETS EVERY DAY 04/02/18  Yes Crenshaw, Madolyn Frieze, MD  guaiFENesin (MUCINEX) 600 MG 12 hr tablet Take 1 tablet (600 mg total) by mouth 2 (two) times daily. Patient taking differently: Take 600 mg by mouth 2 (two) times daily as needed for cough or to loosen phlegm.  05/18/18  Yes Danford, Earl Lites, MD  Investigational - Study Medication Take 1 tablet by mouth 2 (two) times daily. Study name: Galactic Heart Failure Research Study Additional study details: Omecamtiv Mecarbil or Placebo Patient taking differently: Take 1 tablet by mouth 2 (two) times daily. Study name: Galactic Heart Failure Research Study Additional study details: Hilario Quarry or Placebo Denman George [phone] 08/28/17  Yes Laurey Morale, MD  KLOR-CON M20 20 MEQ tablet TAKE 2 TABLETS THREE TIMES DAILY. PLEASE SCHEDULE APPOINTMENT FOR REFILLS. Patient taking differently: TAKE 1 TABLETS twice a day 01/18/18  Yes Crenshaw, Madolyn Frieze, MD  Multiple Vitamin (MULITIVITAMIN WITH MINERALS) TABS Take 1 tablet by mouth daily.   Yes [provider]  niacin (NIASPAN) 500 MG CR tablet Take 500 mg by mouth 2 (two) times daily.    Yes [provider]  nitroGLYCERIN (NITROSTAT) 0.4 MG SL tablet Place 1 tablet (0.4  mg total) under the tongue every 5 (five) minutes as needed for chest pain. X 3 doses 01/21/18  Yes Lewayne Bunting, MD  pravastatin (PRAVACHOL) 80 MG tablet TAKE 1 TABLET EVERY DAY 04/02/18  Yes Crenshaw, Madolyn Frieze, MD  sacubitril-valsartan (ENTRESTO) 49-51 MG Take 1 tablet by mouth 2 (two) times daily. 12/13/17  Yes Meng, Wynema Birch, PA  spironolactone (ALDACTONE) 25 MG tablet TAKE 1 TABLET EVERY DAY 04/02/18  Yes Lewayne Bunting, MD  tiotropium (SPIRIVA HANDIHALER) 18 MCG inhalation capsule Place 1 capsule (18 mcg total) into inhaler and inhale daily. 12/27/17 12/27/18 Yes Leslye Peer, MD  budesonide-formoterol [facility]) 160-4.5 MCG/ACT inhaler Inhale 2 puffs into the lungs 2 (two) times daily. Patient not taking: Reported on 06/14/2018 09/17/17   Leslye Peer, MD  doxycycline (VIBRA-TABS) 100 MG tablet Take 1 tablet (100 mg total) by mouth every 12 (twelve) hours. Patient not taking: Reported on 06/14/2018 05/18/18   Alberteen Sam, MD  predniSONE (STERAPRED UNI-PAK 21 TAB) 10 MG (21) TBPK tablet Take prednisone taper as directed. Patient not taking: Reported on 06/14/2018 05/18/18   Alberteen Sam, MD     Allergies:     Allergies  Allergen Reactions  . Buprenorphine Hcl Other (See Comments)    Patient stated that he did not do well on this medication. He felt like climbing the walls  . Morphine And Related Other (See Comments)    Patient stated that he did not do well on this medication. He felt like climbing the walls  . Other Other (See  Comments)    Patient stated that he did not do well on this medication. He felt like climbing the walls     Physical Exam:   Vitals  Blood pressure 130/80, pulse 77, temperature 97.9 F (36.6 C), temperature source Oral, resp. rate (!) 24, height 6\' 1"  (1.854 m), weight 86.6 kg (191 lb), SpO2 93 %.   1. General lying in bed in NAD,    2. Normal affect and insight, Not Suicidal or Homicidal, Awake Alert, Oriented X 3.  3. No F.N deficits,  ALL C.Nerves Intact, Strength 5/5 all 4 extremities, Sensation intact all 4 extremities, Plantars down going.  4. Ears and Eyes appear Normal, Conjunctivae clear, PERRLA. Moist Oral Mucosa.  5. Supple Neck, No JVD, No cervical lymphadenopathy appriciated, No Carotid Bruits.  6. Symmetrical Chest wall movement, lung sound tight, slight bilateral exp wheezing,  No crackles.   7. RRR, No Gallops, Rubs or Murmurs, No Parasternal Heave.  8. Positive Bowel Sounds, Abdomen Soft, No tenderness, No organomegaly appriciated,No rebound -guarding or rigidity.  9.  No Cyanosis, Normal Skin Turgor, No Skin Rash or Bruise.  10. Good muscle tone,  joints appear normal , no effusions, Normal ROM.  11. No Palpable Lymph Nodes in Neck or Axillae     Data Review:    CBC Recent Labs  Lab 06/14/18 2007  WBC 8.8  HGB 16.7  HCT 52.2*  PLT 197  MCV 97.9  MCH 31.3  MCHC 32.0  RDW 14.2   ------------------------------------------------------------------------------------------------------------------  Chemistries  Recent Labs  Lab 06/14/18 2007  NA 140  K 4.1  CL 106  CO2 23  GLUCOSE 100*  BUN 20  CREATININE 1.35*  CALCIUM 8.7*   ------------------------------------------------------------------------------------------------------------------ estimated creatinine clearance is 53.4 mL/min (A) (by C-G formula based on SCr of 1.35 mg/dL (H)). ------------------------------------------------------------------------------------------------------------------ No results for input(s): TSH, T4TOTAL, T3FREE, THYROIDAB in the last 72 hours.  Invalid input(s): FREET3  Coagulation profile No results for input(s): INR, PROTIME in the last 168 hours. ------------------------------------------------------------------------------------------------------------------- No results for input(s): DDIMER in the last 72  hours. -------------------------------------------------------------------------------------------------------------------  Cardiac Enzymes No results for input(s): CKMB, TROPONINI, MYOGLOBIN in the last 168 hours.  Invalid input(s): CK ------------------------------------------------------------------------------------------------------------------    Component Value Date/Time   BNP 170.6 (H) 06/14/2018 2007   BNP 1,794.7 (H) 03/28/2016 1017     ---------------------------------------------------------------------------------------------------------------  Urinalysis    Component Value Date/Time   COLORURINE YELLOW 01/21/2016 1905   APPEARANCEUR CLEAR 01/21/2016 1905   LABSPEC >1.046 (H) 01/21/2016 1905   PHURINE 6.5 01/21/2016 1905   GLUCOSEU NEGATIVE 01/21/2016 1905   HGBUR NEGATIVE 01/21/2016 1905   BILIRUBINUR NEGATIVE 01/21/2016 1905   KETONESUR NEGATIVE 01/21/2016 1905   PROTEINUR NEGATIVE 01/21/2016 1905   UROBILINOGEN 1.0 10/24/2015 2051   NITRITE NEGATIVE 01/21/2016 1905   LEUKOCYTESUR NEGATIVE 01/21/2016 1905    ----------------------------------------------------------------------------------------------------------------   Imaging Results:    Dg Chest 2 View  Result Date: 06/14/2018 CLINICAL DATA:  Acute chest pain and shortness of breath today. EXAM: CHEST - 2 VIEW COMPARISON:  05/16/2018 and prior radiographs FINDINGS: Cardiomegaly, CABG changes and LEFT AICD/pacemaker again noted. COPD/emphysema type changes again noted. There is no evidence of focal airspace disease, pulmonary edema, suspicious pulmonary nodule/mass, pleural effusion, or pneumothorax. No acute bony abnormalities are identified. IMPRESSION: No evidence of acute cardiopulmonary disease. Cardiomegaly and COPD/emphysema type changes. Electronically Signed   By: Harmon Pier M.D.   On: 06/14/2018 20:35       Assessment & Plan:    Principal  Problem:   COPD exacerbation (HCC) Active Problems:    PAF (paroxysmal atrial fibrillation) (HCC)   CAD (coronary artery disease)   Dyspnea  Likely Copd exacerbation Solumedrol 80mg  iv q8h Zithromax 500mg  iv qday spiriva 1puff qday Albuterol neb q6h and q6h prn  Social work consulted to help with cost of medication which has led to multiple admissions recently for the same   Pafib, CHF (EF 20%) Cont Entresto Cont spironolactone 25mg  po qday Cont Lasix 40mg  po qday Cont Zebeta 2.5mg  poq day Cont Amlodipine 5mg  po qday  CAD s/p CABG Cont Aspirin 325mg  po qday Cotn Zebeta as above Cont Pravastatin 80mg  po qhs  Anxiety Cont Celexa 10mg  po qday    DVT Prophylaxis Lovenox - SCDs  AM Labs Ordered, also please review Full Orders  Family Communication: Admission, patients condition and plan of care including tests being ordered have been discussed with the patient  who indicate understanding and agree with the plan and Code Status.  Code Status FULL CODE  Likely DC to  home  Condition GUARDED    Consults called:  none  Admission status: inpatient  Time spent in minutes : 60   Pearson Grippe M.D on 06/15/2018 at 12:14 AM  Between 7am to 7pm - Pager - [phone]   After 7pm go to [email or web address] - password [email or web address]  [facility] - Office  [phone]

## 2018-06-16 LAB — MAGNESIUM: Magnesium: 2.3 mg/dL (ref 1.7–2.4)

## 2018-06-16 LAB — BASIC METABOLIC PANEL
ANION GAP: 8 (ref 5–15)
BUN: 29 mg/dL — ABNORMAL HIGH (ref 8–23)
CALCIUM: 8.8 mg/dL — AB (ref 8.9–10.3)
CHLORIDE: 106 mmol/L (ref 98–111)
CO2: 27 mmol/L (ref 22–32)
CREATININE: 1.37 mg/dL — AB (ref 0.61–1.24)
GFR calc Af Amer: 57 mL/min — ABNORMAL LOW (ref >60)
GFR, EST NON AFRICAN AMERICAN: 49 mL/min — AB (ref >60)
Glucose, Bld: 148 mg/dL — ABNORMAL HIGH (ref 70–99)
Potassium: 4.9 mmol/L (ref 3.5–5.1)
SODIUM: 141 mmol/L (ref 135–145)

## 2018-06-16 LAB — CBC
HCT: 50.3 % (ref 39.0–52.0)
HEMOGLOBIN: 16.1 g/dL (ref 13.0–17.0)
MCH: 31.3 pg (ref 26.0–34.0)
MCHC: 32 g/dL (ref 30.0–36.0)
MCV: 97.9 fL (ref 78.0–100.0)
PLATELETS: 184 10*3/uL (ref 150–400)
RBC: 5.14 MIL/uL (ref 4.22–5.81)
RDW: 14.2 % (ref 11.5–15.5)
WBC: 15.2 10*3/uL — AB (ref 4.0–10.5)

## 2018-06-16 MED ORDER — DOXYCYCLINE HYCLATE 100 MG PO TABS: 100.0000 mg | ORAL_TABLET | Freq: Two times a day (BID) | ORAL | 0 refills | Status: AC

## 2018-06-16 MED ORDER — PREDNISONE 20 MG PO TABS: ORAL_TABLET | ORAL | 0 refills | Status: AC

## 2018-06-16 NOTE — Care Management Note (Signed)
Case Management Note  Patient Details  Name: [name] [name] MRN: [id number] Date of Birth: 1941-02-02  Subjective/Objective:          Pt from home with son for COPD exacerbation.  Pt observed walking long distances in halls.  Discussed HH/DME with patient as MD plans to order [facility] RN for patient.  Pt states he does not need DME and declines HH RN.  Pt states [place] and wants to be able to travel freely.  He states he has learned how to monitor his symptoms and will promptly seek medical attention when his breathing worsens.  Pt had also expressed that he could not afford medications.  Pt states that since his admission, he has spoken to his daughter and she will help manage cost of his medications.        Action/Plan: No CM needs at this time.   Expected Discharge Date:                  Expected Discharge Plan:  Home w Home Health Services  In-House Referral:  NA  Discharge planning Services  CM Consult, Medication Assistance  Post Acute Care Choice:  Home Health Choice offered to:  Patient  DME Arranged:    DME Agency:     HH Arranged:  NA HH Agency:  NA  Status of Service:  Completed, signed off  If discussed at Long Length of Stay Meetings, dates discussed:    Additional Comments:  Deveron Furlong, RN 06/16/2018, 1:37 PM

## 2018-06-16 NOTE — Discharge Summary (Signed)
Physician Discharge Summary  [name] [name] ZOX:[id number] DOB: 1941-04-29 DOA: 06/14/2018  PCP: [name], MD  Admit date: 06/14/2018 Discharge date: 06/16/2018  Time spent: 35 minutes  Recommendations for Outpatient Follow-up:  1. Follow up CBC/CMP (attention to kidney function) 2. Follow up with pulmonology and PCP 3. Ensure compliance with medications and that he's able to obtain meds (concern for cost, but he denied this to me) 4.  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 Pt felt tightness in his chest which is typical of his Copd exacerbation and due to increase in dyspnea presented to ED. Pt notes that he can not afford his inhalers and has not been using them over the past 3 weeks. Pt denies fever, chills, cp, palp, orthopnea, pnd, lower ext edema,   He was treated for a COPD exacerbation.  He improved gradually with nebs, steroids, and antibiotics.  He was discharged with recommendations to f/u with PCP and pulm.  Hospital Course:  Shortness of breath 2/2 Copd exacerbation Solumedrol -> prednisone taper at dicsharge Doxycycline x 5 days Resume spiriva and symbicort at discharge (pt notes he was compliance, but per H&P he'd been unable to obtain some meds.  He notes he's had no problems with this.  Would  continue to follow as outpatient) RVP negative  CHF (EF 20%) s/p pacemaker/defibrillator Cont Entresto, lasix, spironolactone Cont Zebeta 2.5mg  poq day Cont Amlodipine 5mg  po qday Study med  Acute Kidney Injury:  Baseline creatinine around 1.3.  Bumped to ~1.8, but improved after IVF.  Appears euvolemic on exam.  UA without protein or RBC's BP and HF meds continued at discharge, would continue to monitor closely kidney function  Atrial Fibrillation: s/p watchman procedure  CAD s/p CABG Cont Aspirin 325mg  po qday Cotn Zebeta as above Cont Pravastatin 80mg  po qhs  Anxiety Cont Celexa 10mg  po qday  Procedures:  none   Consultations:  none  Discharge Exam: Vitals:   06/16/18 1200 06/16/18 1350  BP: 112/64   Pulse: 68   Resp: 16   Temp: 97.7 F (36.5 C)   SpO2: 100% 97%   Feeling better.  Frustrated.  Doesn't want this to happen every month (discussed importance of f/u with PCP and Dr. Delton Coombes to ensure continued improvement).  General: No acute distress. Cardiovascular: Heart sounds show a regular rate, and rhythm. No gallops or rubs. No murmurs. No JVD. Lungs: Clear to auscultation bilaterally with good air movement. Occasional wheezes Abdomen: Soft, nontender, nondistended with normal active bowel sounds. No masses. No hepatosplenomegaly. Neurological: Alert and oriented 3. Moves all extremities 4. Cranial nerves II through XII grossly intact. Skin: Warm and dry. No rashes or lesions. Extremities: No clubbing or cyanosis. No edema.  Psychiatric: Mood and affect are normal. Insight and judgment are appropirate. Discharge Instructions  Discharge Instructions    (HEART FAILURE PATIENTS) Call MD:  Anytime you have any of the following symptoms: 1) 3 pound weight gain in 24 hours or 5 pounds in 1 week 2) shortness of breath, with or without a dry hacking cough 3) swelling in the hands, feet or stomach 4) if you have to sleep on extra pillows at night in order to  breathe.   Complete by:  As directed    Call MD for:  difficulty breathing, headache or visual disturbances   Complete by:  As directed    Call MD for:  extreme fatigue   Complete by:  As directed    Call MD for:  hives   Complete by:  As directed    Call MD for:  persistant dizziness or light-headedness   Complete by:  As directed    Call MD for:  persistant nausea and vomiting   Complete by:  As directed    Call MD for:  redness, tenderness, or signs of infection (pain, swelling, redness, odor or green/yellow discharge around incision site)   Complete by:  As directed    Call MD for:  severe uncontrolled pain   Complete by:  As directed    Call MD for:  temperature >100.4   Complete by:  As directed    Diet - low sodium heart healthy   Complete by:  As directed    Discharge instructions   Complete by:  As directed    You were seen for a COPD exacerbation.  You've improved with steroids, antibiotics, and inhaler treatment.  Please continue taking your spiriva and symbicort daily.  Please call Dr. Delton Coombes and your PCP for a follow up appointment.  Take your albuterol every 4-6 hours for the next 24-48 hours until you feel back to normal.    Resume your heart failure medications.  Please follow up repeat labs in a few days with your PCP.  Return if you have new, recurrent, or worsening symptoms.  Please ask your PCP to request records from this hospitalization so they know what was done and what the next steps will be.   Increase activity slowly   Complete by:  As directed      Allergies as of 06/16/2018      Reactions   Buprenorphine Hcl Other (See Comments)   Patient stated that he did not do well on this medication. He felt like climbing the walls   Morphine And Related Other (See Comments)   Patient stated that he did not do well on this medication. He felt like climbing the walls   Other Other (See Comments)   Patient stated that he did not do well on this medication. He  felt like climbing the walls      Medication List    STOP taking these medications   predniSONE 10 MG (21) Tbpk tablet Commonly known as:  STERAPRED UNI-PAK 21 TAB Replaced by:  predniSONE 20 MG tablet     TAKE these medications   albuterol 108 (90 Base) MCG/ACT inhaler Commonly known as:  PROVENTIL HFA;VENTOLIN HFA Inhale 2 puffs into the lungs every 6 (six) hours as needed for wheezing or shortness of breath.   albuterol (2.5 MG/3ML) 0.083% nebulizer solution Commonly known as:  PROVENTIL Take 3 mLs (2.5 mg total) by nebulization every 4 (four) hours as needed for wheezing or shortness of breath.   amLODipine 10 MG tablet Commonly known as:  NORVASC Take 0.5 tablets (5 mg total)  by mouth daily.   aspirin 325 MG EC tablet Take 1 tablet (325 mg total) by mouth daily.   bisoprolol 5 MG tablet Commonly known as:  ZEBETA Take 0.5 tablets (2.5 mg total) by mouth daily.   budesonide-formoterol 160-4.5 MCG/ACT inhaler Commonly known as:  SYMBICORT Inhale 2 puffs into the lungs 2 (two) times daily.   citalopram 20 MG tablet Commonly known as:  CELEXA Take 10 mg by mouth daily.   doxycycline 100 MG tablet Commonly known as:  VIBRA-TABS Take 1 tablet (100 mg total) by mouth every 12 (twelve) hours for 4 days.   furosemide 20 MG tablet Commonly known as:  LASIX TAKE 2 TABLETS EVERY DAY   guaiFENesin 600 MG 12 hr tablet Commonly known as:  MUCINEX Take 1 tablet (600 mg total) by mouth 2 (two) times daily. What changed:    when to take this  reasons to take this   Investigational - Study Medication Take 1 tablet by mouth 2 (two) times daily. Study name: [facility] Heart Failure Research Study Additional study details: Omecamtiv Mecarbil or Placebo What changed:  additional instructions   KLOR-CON M20 20 MEQ tablet Generic drug:  potassium chloride SA TAKE 2 TABLETS THREE TIMES DAILY. PLEASE SCHEDULE APPOINTMENT FOR REFILLS. What changed:  See the new instructions.    multivitamin with minerals Tabs tablet Take 1 tablet by mouth daily.   niacin 500 MG CR tablet Commonly known as:  NIASPAN Take 500 mg by mouth 2 (two) times daily.   nitroGLYCERIN 0.4 MG SL tablet Commonly known as:  NITROSTAT Place 1 tablet (0.4 mg total) under the tongue every 5 (five) minutes as needed for chest pain. X 3 doses   pravastatin 80 MG tablet Commonly known as:  PRAVACHOL TAKE 1 TABLET EVERY DAY   predniSONE 20 MG tablet Commonly known as:  DELTASONE Take 2 tablets (40 mg total) by mouth daily with breakfast for 3 days, THEN 1.5 tablets (30 mg total) daily with breakfast for 3 days, THEN 1 tablet (20 mg total) daily with breakfast for 3 days, THEN 0.5 tablets (10 mg total) daily with breakfast for 3 days. Start taking on:  06/17/2018 Replaces:  predniSONE 10 MG (21) Tbpk tablet   sacubitril-valsartan 49-51 MG Commonly known as:  ENTRESTO Take 1 tablet by mouth 2 (two) times daily.   spironolactone 25 MG tablet Commonly known as:  ALDACTONE TAKE 1 TABLET EVERY DAY   tiotropium 18 MCG inhalation capsule Commonly known as:  SPIRIVA HANDIHALER Place 1 capsule (18 mcg total) into inhaler and inhale daily.      Allergies  Allergen Reactions  . Buprenorphine Hcl Other (See Comments)    Patient stated that he did not do well on this medication. He felt like climbing the walls  . Morphine And Related Other (See Comments)    Patient stated that he did not do well on this medication. He felt like climbing the walls  . Other Other (See Comments)    Patient stated that he did not do well on this medication. He felt like climbing the walls      The results of significant diagnostics from this hospitalization (including imaging, microbiology, ancillary and laboratory) are listed below for reference.    Significant Diagnostic Studies: Dg Chest 2 View  Result Date: 06/14/2018 CLINICAL DATA:  Acute chest pain and shortness of breath today. EXAM: CHEST - 2 VIEW  COMPARISON:  05/16/2018 and prior radiographs FINDINGS: Cardiomegaly, CABG changes and LEFT AICD/pacemaker again noted.  COPD/emphysema type changes again noted. There is no evidence of focal airspace disease, pulmonary edema, suspicious pulmonary nodule/mass, pleural effusion, or pneumothorax. No acute bony abnormalities are identified. IMPRESSION: No evidence of acute cardiopulmonary disease. Cardiomegaly and COPD/emphysema type changes. Electronically Signed   By: Harmon Pier M.D.   On: 06/14/2018 20:35    Microbiology: Recent Results (from the past 240 hour(s))  Respiratory Panel by PCR     Status: None   Collection Time: 06/15/18 12:16 PM  Result Value Ref Range Status   Adenovirus NOT DETECTED NOT DETECTED Final   Coronavirus 229E NOT DETECTED NOT DETECTED Final   Coronavirus HKU1 NOT DETECTED NOT DETECTED Final   Coronavirus NL63 NOT DETECTED NOT DETECTED Final   Coronavirus OC43 NOT DETECTED NOT DETECTED Final   Metapneumovirus NOT DETECTED NOT DETECTED Final   Rhinovirus / Enterovirus NOT DETECTED NOT DETECTED Final   Influenza A NOT DETECTED NOT DETECTED Final   Influenza B NOT DETECTED NOT DETECTED Final   Parainfluenza Virus 1 NOT DETECTED NOT DETECTED Final   Parainfluenza Virus 2 NOT DETECTED NOT DETECTED Final   Parainfluenza Virus 3 NOT DETECTED NOT DETECTED Final   Parainfluenza Virus 4 NOT DETECTED NOT DETECTED Final   Respiratory Syncytial Virus NOT DETECTED NOT DETECTED Final   Bordetella pertussis NOT DETECTED NOT DETECTED Final   Chlamydophila pneumoniae NOT DETECTED NOT DETECTED Final   Mycoplasma pneumoniae NOT DETECTED NOT DETECTED Final    Comment: Performed at [facility], [address] [place], [place] [address]     Labs: Basic Metabolic Panel: Recent Labs  Lab 06/14/18 2007 06/15/18 0501 06/16/18 0641  NA 140 139 141  K 4.1 3.6 4.9  CL 106 104 106  CO2 23 20* 27  GLUCOSE 100* 261* 148*  BUN 20 24* 29*  CREATININE 1.35* 1.85* 1.37*   CALCIUM 8.7* 8.8* 8.8*  MG  --   --  2.3   Liver Function Tests: Recent Labs  Lab 06/15/18 0501  AST 25  ALT 14  ALKPHOS 71  BILITOT 0.7  PROT 6.1*  ALBUMIN 3.4*   No results for input(s): LIPASE, AMYLASE in the last 168 hours. No results for input(s): AMMONIA in the last 168 hours. CBC: Recent Labs  Lab 06/14/18 2007 06/15/18 0501 06/16/18 0641  WBC 8.8 7.3 15.2*  HGB 16.7 16.5 16.1  HCT 52.2* 51.3 50.3  MCV 97.9 98.5 97.9  PLT 197 184 184   Cardiac Enzymes: No results for input(s): CKTOTAL, CKMB, CKMBINDEX, TROPONINI in the last 168 hours. BNP: BNP (last 3 results) Recent Labs    05/16/18 0204 06/14/18 2007  BNP 107.1* 170.6*    ProBNP (last 3 results) No results for input(s): PROBNP in the last 8760 hours.  CBG: No results for input(s): GLUCAP in the last 168 hours.     Signed:  Lacretia Nicks MD.  [facility] 06/16/2018, 7:15 PM

## 2018-06-16 NOTE — Progress Notes (Signed)
Patient given discharge instructions and all questions answered.  

## 2018-06-16 NOTE — Evaluation (Signed)
Physical Therapy Evaluation Patient Details Name: [name] [name] MRN: [id number] DOB: Apr 10, 1941 Today's Date: 06/16/2018   History of Present Illness  [name] [name]  is a 77 y.o. male, w h/o stroke, L CEA, Pafib, Aortic dissection, CAD s/p CABG, CHF (EF 20-25%)  Copd not on home o2 apparently c/o dyspnea since last nite 11pm as well as dry cough starting this am.  Pt felt tightness in his chest which is typical of his Copd exacerbation and due to increase in dyspnea presented to ED.  Clinical Impression  Pt admitted with above. Pt functioning at baseline however has DOE/SOB but his SpO2 >94% on RA. Pt very frustrated. Spoke with Dr. Lowell Guitar regarding pt's concern. MD suspects it is his COPD. Acute PT to cont to follow.    Follow Up Recommendations No PT follow up    Equipment Recommendations  None recommended by PT    Recommendations for Other Services       Precautions / Restrictions Precautions Precautions: Other (comment) Precaution Comments: SOB with activity Restrictions Weight Bearing Restrictions: No      Mobility  Bed Mobility Overal bed mobility: Independent             General bed mobility comments: no difficulty  Transfers Overall transfer level: Independent Equipment used: None             General transfer comment: no difficulty  Ambulation/Gait Ambulation/Gait assistance: Supervision Gait Distance (Feet): 500 Feet Assistive device: None Gait Pattern/deviations: WFL(Within Functional Limits) Gait velocity: wfl Gait velocity interpretation: >4.37 ft/sec, indicative of normal walking speed General Gait Details: no episodes of LOB, pt SOB however SPO2 >95% on RA.   Stairs Stairs: Yes Stairs assistance: Min guard Stair Management: One rail Right Number of Stairs: 10 General stair comments: no difficulty  Wheelchair Mobility    Modified Rankin (Stroke Patients Only)       Balance                                  Standardized Balance Assessment Standardized Balance Assessment : Dynamic Gait Index   Dynamic Gait Index Level Surface: Normal Change in Gait Speed: Normal Gait with Horizontal Head Turns: Normal Gait with Vertical Head Turns: Normal Gait and Pivot Turn: Normal Step Over Obstacle: Normal Step Around Obstacles: Normal Steps: Mild Impairment Total Score: 23       Pertinent Vitals/Pain Pain Assessment: No/denies pain    Home Living Family/patient expects to be discharged to:: Private residence Living Arrangements: Children Available Help at Discharge: Family;Available PRN/intermittently Type of Home: House Home Access: Stairs to enter Entrance Stairs-Rails: Can reach both Entrance Stairs-Number of Steps: 3 Home Layout: One level Home Equipment: Grab bars - tub/shower;Hand held shower head      Prior Function Level of Independence: Independent         Comments: drives     Hand Dominance   Dominant Hand: Right    Extremity/Trunk Assessment   Upper Extremity Assessment Upper Extremity Assessment: Overall WFL for tasks assessed    Lower Extremity Assessment Lower Extremity Assessment: Overall WFL for tasks assessed    Cervical / Trunk Assessment Cervical / Trunk Assessment: Normal  Communication   Communication: No difficulties  Cognition Arousal/Alertness: Awake/alert Behavior During Therapy: WFL for tasks assessed/performed Overall Cognitive Status: Within Functional Limits for tasks assessed  General Comments General comments (skin integrity, edema, etc.): pt with noted significant SOB.with mobility    Exercises     Assessment/Plan    PT Assessment Patient needs continued PT services  PT Problem List Decreased activity tolerance       PT Treatment Interventions      PT Goals (Current goals can be found in the Care Plan section)  Acute Rehab PT Goals Patient Stated Goal: why am I SOB  but my O2 is good? PT Goal Formulation: With patient/family Time For Goal Achievement: 06/30/18 Potential to Achieve Goals: Good Additional Goals Additional Goal #1: Pt to score above 45 on Berg Balance Test to indicate minimal falls risk.    Frequency Min 2X/week   Barriers to discharge        Co-evaluation               AM-PAC PT "6 Clicks" Daily Activity  Outcome Measure Difficulty turning over in bed (including adjusting bedclothes, sheets and blankets)?: None Difficulty moving from lying on back to sitting on the side of the bed? : None Difficulty sitting down on and standing up from a chair with arms (e.g., wheelchair, bedside commode, etc,.)?: None Help needed moving to and from a bed to chair (including a wheelchair)?: None Help needed walking in hospital room?: None Help needed climbing 3-5 steps with a railing? : A Little 6 Click Score: 23    End of Session Equipment Utilized During Treatment: Gait belt Activity Tolerance: Patient tolerated treatment well Patient left: in chair;with call bell/phone within reach Nurse Communication: Mobility status PT Visit Diagnosis: Difficulty in walking, not elsewhere classified (R26.2)    Time: 4098-1191 PT Time Calculation (min) (ACUTE ONLY): 20 min   Charges:   PT Evaluation $PT Eval Low Complexity: 1 Low     PT G CodesLewis Shock, PT, DPT Pager #: [phone] Office #: [phone]   Jaime Dome M Ferdinando Lodge 06/16/2018, 12:50 PM

## 2018-06-20 ENCOUNTER — Inpatient Hospital Stay: Payer: Medicare HMO | Admitting: Pulmonary Disease

## 2018-06-21 ENCOUNTER — Ambulatory Visit (INDEPENDENT_AMBULATORY_CARE_PROVIDER_SITE_OTHER): Payer: Medicare HMO

## 2018-06-21 DIAGNOSIS — Z95810 Presence of automatic (implantable) cardiac defibrillator: Secondary | ICD-10-CM | POA: Diagnosis not present

## 2018-06-21 DIAGNOSIS — I5042 Chronic combined systolic (congestive) and diastolic (congestive) heart failure: Secondary | ICD-10-CM

## 2018-06-21 NOTE — Progress Notes (Signed)
EPIC Encounter for ICM Monitoring  Patient Name: [name] [name] is a 77 y.o. male Date: 06/21/2018 Primary Care Physican: Raina Mina., MD Primary Cardiologist:Crenshaw Electrophysiologist: [name] Dry Weight:186lbs Bi-V Pacing: 97%  AT/AF Burden 47%       Heart Failure questions reviewed, pt asymptomatic.  Hospitalization 7/5 - 7/7 for COPD exacerbation.  Per hospital notes, patient unable to afford inhalers and has applied for patient assistance programs.   Thoracic impedance normal.  Prescribed dosage: Furosemide 20 mg 2tablets (40 mg total) daily. Potassium 20 mEq 1tablets aday.   Labs: 05/18/2018 Creatinine 1.80, BUN 45, Potassium 5.1, Sodium 141, EGFR 35-41 05/17/2018 Creatinine 1.86, BUN 45, Potassium 5.2, Sodium 140, EGFR 34-39  05/16/2018 Creatinine 1.30, BUN 31, Potassium 4.9, Sodium 143 @ 2:08 AM  05/16/2018 Creatinine 1.37, BUN 23, Potassium 5.1, Sodium 143, EGFR 49-57  @ 1:50 AM  03/05/2018 Creatinine 1.30, BUN 13, Potassium 3.4, Sodium 147, EGFR 53-62 Care Everywhere 09/17/2017 Creatinine1.31, BUN26, Potassium4.1, [id number], [id number] 06/26/2017 Creatinine1.13, BUN27, Potassium5.1, Sodium143, [id number]  06/19/2017 Creatinine1.31, BUN33, Potassium5.1, [id number], [id number]  06/12/2017 Creatinine1.44, BUN41, Potassium5.5, [id number], [id number]  Recommendations: No changes.  Encouraged to call for fluid symptoms.  Follow-up plan: ICM clinic phone appointment on 07/22/2018.   Office appointment scheduled 09/06/2018 with Dr. Stanford Breed.    Copy of ICM check sent to Dr. Rayann Heman.   3 month ICM trend: 06/21/2018    1 Year ICM trend:       Rosalene Billings, RN 06/21/2018 8:55 AM

## 2018-06-25 ENCOUNTER — Telehealth: Payer: Self-pay | Admitting: Emergency Medicine

## 2018-06-25 NOTE — Progress Notes (Signed)
@Patient  ID: [name] [name], male    DOB: 1941-10-23, 77 y.o.   MRN: [id number]  Chief Complaint  Patient presents with  . Follow-up    states his breathing has been good.     Referring provider: [name], MD  HPI: 77 year old current every day smoker managed for COPD in our office. Patient of Dr. Delton Coombes  Recent [facility] Encounters:   ROV 10/01/17 -- This follow-up visit for patient with a history of tobacco use, coronary disease, hypertension, atrial fibrillation, MR with decreased LV fxn. I have followed him for mild obstruction and COPD. He had never truly benefited significantly from scheduled bronchodilators on our past evaluations. He was admitted with an apparent acute exacerbation of COPD + his cardiac disease June 2018, was discharged on Spiriva and Symbicort. He underwent pulmonary function testing today and I have reviewed. This shows mild obstruction, FEV1 3.32 L (100% predicted). Some evidence for restriction based on decreased residual volume, decreased diffusion capacity that does not correct when adjusted for alveolar volume. He is smoking about a pack a week.   ROV 05/07/18 --pleasant 77 year old man with coronary disease, hypertension and A. fib, MR.  He also has a history of tobacco use (less, not every day) and mild obstruction confirmed on pulmonary function testing, some associated restriction based on his residual volume.  At his last visit he had been started on bronchodilators.  We continued him on Spiriva to see if he felt any benefit. He isn't taking spiriva every day. He feels that his breathing is doing well - he is reliable with spiriva. He had an episode in the last week when he was exposed to dust / pollen, needed [place] Had to [place] [facility] to get some, then felt better. He has been doing some coughing lately, non-productive. Minimal wheeze.  Plan: Emphasized the importance of stopping smoking, will  increase Spiriva to daily use, get flu shot in fall, follow-up in 6 months   Tests:   06/14/2018-chest x-ray-no evidence of acute cardiopulmonary disease, COPD and emphysema present 10/01/2017-pulmonary function test- FF-63, FEV1 100, no bronchodilator response, DLCO 49, concavity and flow volume loops 09/25/2017-echocardiogram-LV ejection fraction 20 to 25%, systolic function severely reduced, grade 2 diastolic dysfunction   Chart Review:  06/14/2018-hospitalization for shortness of breath Discharge date 06/16/2018 Discharge with prednisone taper as well as doxycycline, resume Spiriva and Symbicort   06/26/18 Hospital follow-up 77 year old patient being seen today for hospital follow-up.  Patient was admitted on 06/14/2018, discharged on 06/16/2018.  Patient was diagnosed with COPD exacerbation.  Patient was discharged with prednisone taper as well as doxycycline.  Patient to resume inhaler use.  It appears through chart review that patient was initially just is supposed to be on Spiriva as of May/2019 appointment with Dr. Delton Coombes.  Since then patient has had 2 COPD exacerbations.  Unfortunately patient reports he is smoking again, patient reports he is down to 1 to 2 cigarettes.  But is working on trying to stop.  Patient knows that this can affect his breathing.  Patient also reports that the heat outside has worsened his breathing.  He has been doing better now that he is been staying inside.  He does report that he does have shortness of breath when trying to keep up with other people of his age.  (mMRC-2).  Of note daughter and patient would like it clarified that they have been adherent to their medications.  The patient  has been taking his inhalers.  Apparently at both hospitalizations the providers have cited this is the reason for why he was having a COPD exacerbation.  They would like Dr. Delton Coombes as well as myself to know that they were taking her medications appropriately.  Patient did clearly  state when he needs to be taking his medications and it was accurate.   Allergies  Allergen Reactions  . Buprenorphine Hcl Other (See Comments)    Patient stated that he did not do well on this medication. He felt like climbing the walls  . Morphine And Related Other (See Comments)    Patient stated that he did not do well on this medication. He felt like climbing the walls  . Other Other (See Comments)    Patient stated that he did not do well on this medication. He felt like climbing the walls    Immunization History  Administered Date(s) Administered  . Influenza Split 08/23/2012, 09/10/2013  . Influenza, High Dose Seasonal PF 10/01/2017  . Influenza, Seasonal, Injecte, Preservative Fre 09/26/2015  . Influenza,inj,Quad PF,6+ Mos 08/23/2012, 09/10/2013, 10/06/2014, 09/26/2015  . Influenza-Unspecified 10/04/2016  . Pneumococcal Conjugate-13 12/21/2016  . Pneumococcal Polysaccharide-23 12/12/2011, 08/23/2012  . Tdap 04/01/2013    Past Medical History:  Diagnosis Date  . Aortic dissection (HCC)    H/O focal aortic dissection  . CAD (coronary artery disease)    a. s/p CABG in 1998 with LIMA-LAD, SVG-D1, SVG-OM2-OM3, and SVG-RCA b. cath 10/07:  LM ok, LAD occluded, AV CFX 95%, pRCA occluded; L-LAD, S-OM2/OM3, S-PDA ok c. 2013: cath showing patent grafts  . Cerebrovascular disease    a. s/p prior Left CEA;  followed by Dr. [name]  . CHF (congestive heart failure) (HCC)    a. s/p STJ CRTD  . CKD (chronic kidney disease)   . COPD (chronic obstructive pulmonary disease) (HCC)   . Depression   . DVT (deep venous thrombosis) (HCC)   . History of stomach ulcers   . Hyperlipidemia   . Hypertension   . Iliac artery aneurysm, left (HCC)    followed by Dr. [name]  . Ischemic cardiomyopathy    a. s/p [employer] CRTD placement in 2013  . LBBB (left bundle branch block) 2006  . Myocardial infarction (HCC) 1997  . Nephrolithiasis   . PAF (paroxysmal atrial fibrillation) (HCC) 2006   a. on  amio and pradaxa. b. s/p AVN ablation  . Stroke [facility]) 12/2015 "several mini"   a. on coumadin --> switched to pradaxa b. s/p Watchman in 05/2016    Tobacco History: Social History   Tobacco Use  Smoking Status Current Some Day Smoker  . Packs/day: 1.00  . Years: 48.00  . Pack years: 48.00  . Types: Cigarettes  Smokeless Tobacco Never Used  Tobacco Comment   down to 1-2 cigarettes a day    Ready to quit: Yes Counseling given: Yes Comment: down to 1-2 cigarettes a day  Discussed smoking history patient extensively.  Updated smoking history to reflect peak smoking of 1 to 1-1/2 packs at peak.  Patient reports he is now down to 1 to 2 cigarettes a day.  This is a conservative estimate of 48 pack years and a current smoker.  Emphasized the importance that he needs to stop smoking.  Patient is working on this.  Also will refer patient to lung cancer screening program.  Outpatient Encounter Medications as of 06/26/2018  Medication Sig  . albuterol (PROVENTIL HFA;VENTOLIN HFA) 108 (90 Base) MCG/ACT inhaler Inhale  2 puffs into the lungs every 6 (six) hours as needed for wheezing or shortness of breath.  [name] albuterol (PROVENTIL) (2.5 MG/3ML) 0.083% nebulizer solution Take 3 mLs (2.5 mg total) by nebulization every 4 (four) hours as needed for wheezing or shortness of breath.  [name] amLODipine (NORVASC) 10 MG tablet Take 0.5 tablets (5 mg total) by mouth daily.  [name] aspirin EC 325 MG EC tablet Take 1 tablet (325 mg total) by mouth daily.  . bisoprolol (ZEBETA) 5 MG tablet Take 0.5 tablets (2.5 mg total) by mouth daily.  . budesonide-formoterol (SYMBICORT) 160-4.5 MCG/ACT inhaler Inhale 2 puffs into the lungs 2 (two) times daily.  . citalopram (CELEXA) 20 MG tablet Take 10 mg by mouth daily.   . furosemide (LASIX) 20 MG tablet TAKE 2 TABLETS EVERY DAY  . guaiFENesin (MUCINEX) 600 MG 12 hr tablet Take 1 tablet (600 mg total) by mouth 2 (two) times daily. (Patient taking differently: Take 600 mg by mouth 2  (two) times daily as needed for cough or to loosen phlegm. )  . Investigational - Study Medication Take 1 tablet by mouth 2 (two) times daily. Study name: Galactic Heart Failure Research Study Additional study details: Omecamtiv Mecarbil or Placebo (Patient taking differently: Take 1 tablet by mouth 2 (two) times daily. Study name: [place] Heart Failure Research Study Additional study details: Omecamtiv Mecarbil or Placebo Maggie Chrismo [phone])  . KLOR-CON M20 20 MEQ tablet TAKE 2 TABLETS THREE TIMES DAILY. PLEASE SCHEDULE APPOINTMENT FOR REFILLS. (Patient taking differently: TAKE 1 TABLETS twice a day)  . Multiple Vitamin (MULITIVITAMIN WITH MINERALS) TABS Take 1 tablet by mouth daily.  . niacin (NIASPAN) 500 MG CR tablet Take 500 mg by mouth 2 (two) times daily.   . nitroGLYCERIN (NITROSTAT) 0.4 MG SL tablet Place 1 tablet (0.4 mg total) under the tongue every 5 (five) minutes as needed for chest pain. X 3 doses  . pravastatin (PRAVACHOL) 80 MG tablet TAKE 1 TABLET EVERY DAY  . sacubitril-valsartan (ENTRESTO) 49-51 MG Take 1 tablet by mouth 2 (two) times daily.  [name] spironolactone (ALDACTONE) 25 MG tablet TAKE 1 TABLET EVERY DAY  . tiotropium (SPIRIVA HANDIHALER) 18 MCG inhalation capsule Place 1 capsule (18 mcg total) into inhaler and inhale daily.  . predniSONE (DELTASONE) 20 MG tablet Take 2 tablets (40 mg total) by mouth daily with breakfast for 3 days, THEN 1.5 tablets (30 mg total) daily with breakfast for 3 days, THEN 1 tablet (20 mg total) daily with breakfast for 3 days, THEN 0.5 tablets (10 mg total) daily with breakfast for 3 days. (Patient not taking: No sig reported)   No facility-administered encounter medications on file as of 06/26/2018.      Review of Systems  Review of Systems  Constitutional: Negative for activity change, chills, fatigue, fever and unexpected weight change.  HENT: Negative for postnasal drip, rhinorrhea, sinus pressure, sinus pain, sneezing and sore  throat.   Respiratory: Positive for cough (occasional, white / yellow ) and shortness of breath. Negative for wheezing.   Cardiovascular: Negative for chest pain and palpitations.  Gastrointestinal: Negative for constipation, diarrhea, nausea and vomiting.  Genitourinary: Negative for hematuria and urgency.  Musculoskeletal: Negative for arthralgias.  Skin: Negative for color change.  Neurological: Negative for dizziness, seizures and headaches.  Psychiatric/Behavioral: Negative for dysphoric mood. The patient is not nervous/anxious.   All other systems reviewed and are negative.    06/26/18- MMRC - Breathlessness Score 2 - on level ground, I walk slower than  people of the same age because of breathlessness, or have to stop for breathe when walking to my own pace 10/01/17 Fev1 - 100    Physical Exam  BP 122/72   Pulse (!) 105   Ht 6\' 1"  (1.854 m)   Wt 200 lb 3.2 oz (90.8 kg)   SpO2 97%   BMI 26.41 kg/m   Wt Readings from Last 5 Encounters:  06/26/18 200 lb 3.2 oz (90.8 kg)  06/16/18 196 lb 1.6 oz (89 kg)  05/18/18 198 lb 3.1 oz (89.9 kg)  05/15/18 193 lb 6.4 oz (87.7 kg)  05/07/18 195 lb (88.5 kg)     Physical Exam  Constitutional: He is oriented to person, place, and time and well-developed, well-nourished, and in no distress. No distress.  HENT:  Head: Normocephalic and atraumatic.  Right Ear: Hearing, tympanic membrane, external ear and ear canal normal.  Left Ear: Hearing, tympanic membrane, external ear and ear canal normal.  Nose: Nose normal. Right sinus exhibits no maxillary sinus tenderness and no frontal sinus tenderness. Left sinus exhibits no maxillary sinus tenderness and no frontal sinus tenderness.  Mouth/Throat: Uvula is midline and oropharynx is clear and moist. No oropharyngeal exudate.  Eyes: Pupils are equal, round, and reactive to light.  Neck: Normal range of motion. Neck supple. No JVD present.  Cardiovascular: Normal rate, regular rhythm and normal  heart sounds.  Pulmonary/Chest: Effort normal and breath sounds normal. No accessory muscle usage. No respiratory distress. He has no decreased breath sounds. He has no wheezes. He has no rhonchi.  Slight barrel chest  Abdominal: Soft. Bowel sounds are normal. There is no tenderness.  Musculoskeletal: Normal range of motion. He exhibits no edema.  Lymphadenopathy:    He has no cervical adenopathy.  Neurological: He is alert and oriented to person, place, and time. Gait normal.  Skin: Skin is warm and dry. He is not diaphoretic. No erythema.  Psychiatric: Mood, memory, affect and judgment normal.  Nursing note and vitals reviewed.    Lab Results:  CBC    Component Value Date/Time   WBC 15.2 (H) 06/16/2018 0641   RBC 5.14 06/16/2018 0641   HGB 16.1 06/16/2018 0641   HCT 50.3 06/16/2018 0641   PLT 184 06/16/2018 0641   MCV 97.9 06/16/2018 0641   MCH 31.3 06/16/2018 0641   MCHC 32.0 06/16/2018 0641   RDW 14.2 06/16/2018 0641   LYMPHSABS 2.4 05/16/2018 0150   MONOABS 0.6 05/16/2018 0150   EOSABS 0.6 05/16/2018 0150   BASOSABS 0.1 05/16/2018 0150    BMET    Component Value Date/Time   NA 141 06/16/2018 0641   NA 145 (H) 09/17/2017 1136   K 4.9 06/16/2018 0641   CL 106 06/16/2018 0641   CO2 27 06/16/2018 0641   GLUCOSE 148 (H) 06/16/2018 0641   BUN 29 (H) 06/16/2018 0641   BUN 26 09/17/2017 1136   CREATININE 1.37 (H) 06/16/2018 0641   CREATININE 1.23 (H) 08/15/2016 0902   CALCIUM 8.8 (L) 06/16/2018 0641   GFRNONAA 49 (L) 06/16/2018 0641   GFRNONAA 58 (L) 02/05/2015 1215   GFRAA 57 (L) 06/16/2018 0641   GFRAA 67 02/05/2015 1215    BNP    Component Value Date/Time   BNP 170.6 (H) 06/14/2018 2007   BNP 1,794.7 (H) 03/28/2016 1017    ProBNP    Component Value Date/Time   PROBNP 138.0 (H) 09/19/2013 1326    Imaging: Dg Chest 2 View  Result Date: 06/14/2018 CLINICAL DATA:  Acute chest pain and shortness of breath today. EXAM: CHEST - 2 VIEW COMPARISON:   05/16/2018 and prior radiographs FINDINGS: Cardiomegaly, CABG changes and LEFT AICD/pacemaker again noted. COPD/emphysema type changes again noted. There is no evidence of focal airspace disease, pulmonary edema, suspicious pulmonary nodule/mass, pleural effusion, or pneumothorax. No acute bony abnormalities are identified. IMPRESSION: No evidence of acute cardiopulmonary disease. Cardiomegaly and COPD/emphysema type changes. Electronically Signed   By: Harmon Pier M.D.   On: 06/14/2018 20:35     Assessment & Plan:    Pleasant 77 year old seen for hospital follow-up today.  Discussed with patient that we need to get better control of her other symptoms.  Patient has an mMRC score of 2 today as well as has had 2 COPD exacerbations over the past 3 months requiring prednisone tapers and hospitalizations.  Patient to resume Symbicort 160 twice daily as well as Spiriva.  Could consider patient on trilogy Ellipta if stable at future appointment.  Unfortunately no samples today to provide to patient.  Symbicort sample provided for patient.  We will follow-up with Symbicort rep for [place] for [place] program.  Patient has not heard anything back from their patient assistance program.  Discussed extensively with patient that they need to stop smoking.  Also emphasized the importance that we screen for lung cancer.  Will refer to lung cancer screening program.  Daughter is present with patient on exam today.  I have answered all of their questions.  COPD GOLD I D 10/01/2017 FEV1-100 mMRC today-2 2 COPD exacerbations requiring hospitalization in the past 2 months  Continue Symbicort 160 twice daily Continue Spiriva We will bring patient back in 8 weeks Could consider trelegy Ellipta inhaler to help patient with adherence as well as managing symptoms >>> No samples in office today so unable to trial today  Emphasized the importance of patient stop smoking Emphasized the importance of patient avoiding triggers  such as heat  Referred to lung cancer screening program today  Tobacco use disorder Currently trying to stop smoking Referred to lung cancer screening program today Provided smoking cessation resources to patient today   We will refer you today to her lung cancer screening program >>>This is based off of your 48 pack-year smoking history >>> This is a recommendation from the [place] preventative services task force (USPSTF) >>>The USPSTF recommends annual screening for lung cancer with low-dose computed tomography (LDCT) in adults aged 57 to 80 years who have a 30 pack-year smoking history and currently smoke or have quit within the past 15 years. Screening should be discontinued once a person has not smoked for 15 years or develops a health problem that substantially limits life expectancy or the ability or willingness to have curative lung surgery.   Our office will call you and set up an appointment with Kandice Robinsons (Nurse Practitioner) who leads this program.  This appointment takes place in our office.  After completing this meeting with Kandice Robinsons NP you will get a low-dose CT as the screening >>>We will call you with those results    This appointment was 45 minutes along with her 50% of that time in direct face-to-face patient care, assessment, plan wound care discussion, follow-up.  Also extensively discussed smoking cessation, how that can affect her FEV1 and COPD moving forward, as well as lung cancer screening program in follow-up with that program.  Coral Ceo, NP 06/26/2018

## 2018-06-25 NOTE — Telephone Encounter (Signed)
Attempted to call Jade. I did not receive an answer. I have left a message for Lesly Rubenstein to return our call.

## 2018-06-26 ENCOUNTER — Ambulatory Visit: Payer: Medicare HMO | Admitting: Pulmonary Disease

## 2018-06-26 ENCOUNTER — Encounter: Payer: Self-pay | Admitting: Pulmonary Disease

## 2018-06-26 VITALS — BP 122/72 | HR 105 | Ht 73.0 in | Wt 200.2 lb

## 2018-06-26 DIAGNOSIS — J449 Chronic obstructive pulmonary disease, unspecified: Secondary | ICD-10-CM | POA: Diagnosis not present

## 2018-06-26 DIAGNOSIS — F17200 Nicotine dependence, unspecified, uncomplicated: Secondary | ICD-10-CM

## 2018-06-26 NOTE — Patient Instructions (Addendum)
Continue Symbicort 160 - 40.5 >>> 2 puffs in the morning right when you wake up, rinse out your mouth after use, 12 hours later 2 puffs, rinse after use >>> Take this daily, no matter what >>> This is not a rescue inhaler   Continue Spiriva daily >>> Do this no matter what  Use your rescue inhaler as needed for shortness of breath and wheezing >>> Contact our office if you are having to do this more often than you usually do   Note your daily symptoms > remember "red flags" for COPD:   >>>Increase in cough >>>increase in sputum production >>>increase in shortness of breath or activity  intolerance.   If you notice these symptoms, please call the office to be seen.    We recommend that you stop smoking: 1 800 QUIT NOW  >>> Patient to call this resource and utilize it to help support her quit smoking >>> Keep up your hard work with stopping smoking  You can also contact the [facility] >>>For smoking cessation classes call [phone]  We do not recommend using e-cigarettes as a form of stopping smoking    We will refer you today to her lung cancer screening program >>>This is based off of your 48 pack-year smoking history >>> This is a recommendation from the [place] preventative services task force (USPSTF) >>>The USPSTF recommends annual screening for lung cancer with low-dose computed tomography (LDCT) in adults aged 67 to 80 years who have a 30 pack-year smoking history and currently smoke or have quit within the past 15 years. Screening should be discontinued once a person has not smoked for 15 years or develops a health problem that substantially limits life expectancy or the ability or willingness to have curative lung surgery.   Our office will call you and set up an appointment with Kandice Robinsons (Nurse Practitioner) who leads this program.  This appointment takes place in our office.  After completing this meeting with Kandice Robinsons NP you will get a low-dose CT  as the screening >>>We will call you with those results    Follow-up with our office in 8 weeks    Please contact the office if your symptoms worsen or you have concerns that you are not improving.   Thank you for choosing [facility] for your healthcare, and for allowing [place] to partner with you on your healthcare journey. I am thankful to be able to provide care to you today.   Elisha Headland FNP-C   Coping with Quitting Smoking Quitting smoking is a physical and mental challenge. You will face cravings, withdrawal symptoms, and temptation. Before quitting, work with your health care provider to make a plan that can help you cope. Preparation can help you quit and keep you from giving in. How can I cope with cravings? Cravings usually last for 5-10 minutes. If you get through it, the craving will pass. Consider taking the following actions to help you cope with cravings:  Keep your mouth busy: ? Chew sugar-free gum. ? Suck on hard candies or a straw. ? Brush your teeth.  Keep your hands and body busy: ? Immediately change to a different activity when you feel a craving. ? Squeeze or play with a ball. ? Do an activity or a hobby, like making bead jewelry, practicing needlepoint, or working with wood. ? Mix up your normal routine. ? Take a short exercise break. Go for a quick walk or run up and down stairs. ?  Spend time in public places where smoking is not allowed.  Focus on doing something kind or helpful for someone else.  Call a friend or family member to talk during a craving.  Join a support group.  Call a quit line, such as 1-800-QUIT-NOW.  Talk with your health care provider about medicines that might help you cope with cravings and make quitting easier for you.  How can I deal with withdrawal symptoms? Your body may experience negative effects as it tries to get used to not having nicotine in the system. These effects are called withdrawal symptoms. They may  include:  Feeling hungrier than normal.  Trouble concentrating.  Irritability.  Trouble sleeping.  Feeling depressed.  Restlessness and agitation.  Craving a cigarette.  To manage withdrawal symptoms:  Avoid places, people, and activities that trigger your cravings.  Remember why you want to quit.  Get plenty of sleep.  Avoid coffee and other caffeinated drinks. These may worsen some of your symptoms.  How can I handle social situations? Social situations can be difficult when you are quitting smoking, especially in the first few weeks. To manage this, you can:  Avoid parties, bars, and other social situations where people might be smoking.  Avoid alcohol.  Leave right away if you have the urge to smoke.  Explain to your family and friends that you are quitting smoking. Ask for understanding and support.  Plan activities with friends or family where smoking is not an option.  What are some ways I can cope with stress? Wanting to smoke may cause stress, and stress can make you want to smoke. Find ways to manage your stress. Relaxation techniques can help. For example:  Breathe slowly and deeply, in through your nose and out through your mouth.  Listen to soothing, relaxing music.  Talk with a family member or friend about your stress.  Light a candle.  Soak in a bath or take a shower.  Think about a peaceful place.  What are some ways I can prevent weight gain? Be aware that many people gain weight after they quit smoking. However, not everyone does. To keep from gaining weight, have a plan in place before you quit and stick to the plan after you quit. Your plan should include:  Having healthy snacks. When you have a craving, it may help to: ? Eat plain popcorn, crunchy carrots, celery, or other cut vegetables. ? Chew sugar-free gum.  Changing how you eat: ? Eat small portion sizes at meals. ? Eat 4-6 small meals throughout the day instead of 1-2 large  meals a day. ? Be mindful when you eat. Do not watch television or do other things that might distract you as you eat.  Exercising regularly: ? Make time to exercise each day. If you do not have time for a long workout, do short bouts of exercise for 5-10 minutes several times a day. ? Do some form of strengthening exercise, like weight lifting, and some form of aerobic exercise, like running or swimming.  Drinking plenty of water or other low-calorie or no-calorie drinks. Drink 6-8 glasses of water daily, or as much as instructed by your health care provider.  Summary  Quitting smoking is a physical and mental challenge. You will face cravings, withdrawal symptoms, and temptation to smoke again. Preparation can help you as you go through these challenges.  You can cope with cravings by keeping your mouth busy (such as by chewing gum), keeping your body and hands  busy, and making calls to family, friends, or a helpline for people who want to quit smoking.  You can cope with withdrawal symptoms by avoiding places where people smoke, avoiding drinks with caffeine, and getting plenty of rest.  Ask your health care provider about the different ways to prevent weight gain, avoid stress, and handle social situations. This information is not intended to replace advice given to you by your health care provider. Make sure you discuss any questions you have with your health care provider. Document Released: 11/24/2016 Document Revised: 11/24/2016 Document Reviewed: 11/24/2016 Elsevier Interactive Patient Education  [employer].

## 2018-06-26 NOTE — Assessment & Plan Note (Addendum)
Currently trying to stop smoking Referred to lung cancer screening program today Provided smoking cessation resources to patient today   We will refer you today to her lung cancer screening program >>>This is based off of your 48 pack-year smoking history >>> This is a recommendation from the [place] preventative services task force (USPSTF) >>>The USPSTF recommends annual screening for lung cancer with low-dose computed tomography (LDCT) in adults aged 76 to 80 years who have a 30 pack-year smoking history and currently smoke or have quit within the past 15 years. Screening should be discontinued once a person has not smoked for 15 years or develops a health problem that substantially limits life expectancy or the ability or willingness to have curative lung surgery.   Our office will call you and set up an appointment with Kandice Robinsons (Nurse Practitioner) who leads this program.  This appointment takes place in our office.  After completing this meeting with Kandice Robinsons NP you will get a low-dose CT as the screening >>>We will call you with those results

## 2018-06-26 NOTE — Assessment & Plan Note (Signed)
10/01/2017 FEV1-100 mMRC today-2 2 COPD exacerbations requiring hospitalization in the past 2 months  Continue Symbicort 160 twice daily Continue Spiriva We will bring patient back in 8 weeks Could consider trelegy Ellipta inhaler to help patient with adherence as well as managing symptoms >>> No samples in office today so unable to trial today  Emphasized the importance of patient stop smoking Emphasized the importance of patient avoiding triggers such as heat  Referred to lung cancer screening program today

## 2018-06-27 NOTE — Telephone Encounter (Signed)
Attempted to call Jade. I did not receive an answer. I have left a message for Jade to return our call.  

## 2018-06-28 ENCOUNTER — Telehealth: Payer: Self-pay | Admitting: Emergency Medicine

## 2018-06-28 MED ORDER — TIOTROPIUM BROMIDE MONOHYDRATE 18 MCG IN CAPS: 18.0000 ug | ORAL_CAPSULE | Freq: Every day | RESPIRATORY_TRACT | 5 refills | Status: AC

## 2018-06-28 MED ORDER — BUDESONIDE-FORMOTEROL FUMARATE 160-4.5 MCG/ACT IN AERO: 2.0000 | INHALATION_SPRAY | Freq: Two times a day (BID) | RESPIRATORY_TRACT | 5 refills | Status: DC

## 2018-06-28 NOTE — Telephone Encounter (Signed)
Spoke with pt's daughter, Eunice Blase. She states that pt needs refills on Symbicort and Spiriva. He needs enough to last until his next appointment. Rxs have been sent in. Nothing further was needed.

## 2018-06-28 NOTE — Telephone Encounter (Signed)
Left message for Lesly Rubenstein to call back.

## 2018-07-01 ENCOUNTER — Other Ambulatory Visit: Payer: Self-pay | Admitting: Pulmonary Disease

## 2018-07-01 ENCOUNTER — Telehealth: Payer: Self-pay | Admitting: Pulmonary Disease

## 2018-07-01 DIAGNOSIS — J449 Chronic obstructive pulmonary disease, unspecified: Secondary | ICD-10-CM

## 2018-07-01 DIAGNOSIS — F17200 Nicotine dependence, unspecified, uncomplicated: Secondary | ICD-10-CM

## 2018-07-01 MED ORDER — BUDESONIDE-FORMOTEROL FUMARATE 160-4.5 MCG/ACT IN AERO: 2.0000 | INHALATION_SPRAY | Freq: Two times a day (BID) | RESPIRATORY_TRACT | 5 refills | Status: AC

## 2018-07-01 MED ORDER — BUDESONIDE-FORMOTEROL FUMARATE 80-4.5 MCG/ACT IN AERO: 2.0000 | INHALATION_SPRAY | Freq: Two times a day (BID) | RESPIRATORY_TRACT | 12 refills | Status: AC

## 2018-07-01 NOTE — Progress Notes (Signed)
sy

## 2018-07-01 NOTE — Telephone Encounter (Signed)
Called and spoke to [place] with [employer]. Lesly Rubenstein was seeing pt could be switched to [facility] to make the application process easier for pt assistance. Advised her that the provider has nto switched the pt from Symbicort to [facility] so we should keep the pt on Symbicort. Jade verbalized understanding and states she will just complete a new application for Symbicort's [place]. She states she will fax this to [place] once the form has been completed. Will hold in triage till form is received.

## 2018-07-01 NOTE — Telephone Encounter (Signed)
Called and spoke to pt's daughter, [name] [name].  [name] [name] is requesting Rx for Symbicort to be sent to [facility] in [place], as [place] is currently in [place].  [name] [name] stated that pt's local pharmacy did not receive Rx for Symbicort.  It appears that Rx for Symbicort was printed.  Rx for symbicort 160 has been sent to preferred pharmacy. Nothing further is needed.

## 2018-07-03 NOTE — Telephone Encounter (Signed)
Called and spoke with [name] [name] with [employer] at phone [phone] She advised that her fax machine is not working properly last week that it is being fixed and worked on today. She will re-fax the forms later today Advised her we will call her back tomorrow to f/u

## 2018-07-04 ENCOUNTER — Telehealth: Payer: Self-pay | Admitting: Acute Care

## 2018-07-04 DIAGNOSIS — Z122 Encounter for screening for malignant neoplasm of respiratory organs: Secondary | ICD-10-CM

## 2018-07-04 DIAGNOSIS — F17210 Nicotine dependence, cigarettes, uncomplicated: Principal | ICD-10-CM

## 2018-07-04 NOTE — Telephone Encounter (Signed)
Called and spoke with Lesly Rubenstein with [employer] at phone [phone] this morning She faxed over documents attn this Lillia Abed; fax was rec'd and placed in RB cubby RB has the fax in his look at folder this morning.  Will f/u later this week.

## 2018-07-05 ENCOUNTER — Telehealth: Payer: Self-pay | Admitting: Cardiology

## 2018-07-05 NOTE — Telephone Encounter (Signed)
LMOVM requesting that pt send manual transmission b/c home monitor has not updated in at least 7 days.    

## 2018-07-05 NOTE — Telephone Encounter (Signed)
LMTCB

## 2018-07-05 NOTE — Telephone Encounter (Signed)
Pennie Banter, checking to see if documents that were faxed on 7/25 were received. Jade aware that these forms were received and placed in RB's look at folder. Jade requesting to know if the pt should come by to sign the forms. Cb is [phone]

## 2018-07-08 NOTE — Telephone Encounter (Signed)
Called and spoke with Pennie Banter phone [phone] Advised her we have the forms in RB cubby to sign She advised she called patient this morning to bring in financial statement to office Pt is coming in this week to sign the forms and bring in statement info Will f/u later in the week with pt

## 2018-07-08 NOTE — Telephone Encounter (Signed)
Routing message to Rodena Piety to advise if pt was able to rescheduled LST last week. Rodena Piety in [facility] please advise

## 2018-07-08 NOTE — Telephone Encounter (Signed)
[name] [name] will have to setup the Shared Decision Making Visit with Maralyn Sago before I can schedule the LCS CT.

## 2018-07-08 NOTE — Telephone Encounter (Signed)
Jade from [place] is calling back [phone]

## 2018-07-09 NOTE — Telephone Encounter (Signed)
Spoke with pt and scheduled SDMV 07/22/18 2:30 CT ordered Nothing further needed

## 2018-07-10 ENCOUNTER — Telehealth: Payer: Self-pay | Admitting: Pulmonary Disease

## 2018-07-10 NOTE — Telephone Encounter (Signed)
Phoned patient to get an update of patient assistance papers, patient left application in the office and stated he would bring back a tax document to provide proof of income and a pharmacy print out stating how much he has spent out of pocket. Left message to call back with update.

## 2018-07-10 NOTE — Telephone Encounter (Signed)
Called and spoke with patient regarding signing of paperwork Pt is coming in on 07/22/18 to sign his portion of the paperwork Advised Lillia Abed of above info Nothing further needed at this time.

## 2018-07-15 ENCOUNTER — Telehealth: Payer: Self-pay

## 2018-07-15 NOTE — Telephone Encounter (Signed)
Returned patient call as requested by voice mail message.  He asked if the transmission was received last week.  Advised was received on 07/07/2018 and that transmission was needed for recall update.  Next ICM remote transmission scheduled 07/22/2018

## 2018-07-18 ENCOUNTER — Ambulatory Visit: Payer: Medicare HMO | Admitting: *Deleted

## 2018-07-18 DIAGNOSIS — I5042 Chronic combined systolic (congestive) and diastolic (congestive) heart failure: Secondary | ICD-10-CM

## 2018-07-18 DIAGNOSIS — I255 Ischemic cardiomyopathy: Secondary | ICD-10-CM

## 2018-07-19 ENCOUNTER — Telehealth: Payer: Self-pay

## 2018-07-19 NOTE — Telephone Encounter (Signed)
Spoke with pt and reminded pt of remote transmission that is due today. Pt verbalized understanding.   

## 2018-07-19 NOTE — Progress Notes (Signed)
Remote ICD transmission.   

## 2018-07-22 ENCOUNTER — Ambulatory Visit (INDEPENDENT_AMBULATORY_CARE_PROVIDER_SITE_OTHER): Payer: Medicare HMO | Admitting: Acute Care

## 2018-07-22 ENCOUNTER — Ambulatory Visit (INDEPENDENT_AMBULATORY_CARE_PROVIDER_SITE_OTHER): Payer: Medicare HMO

## 2018-07-22 ENCOUNTER — Ambulatory Visit (INDEPENDENT_AMBULATORY_CARE_PROVIDER_SITE_OTHER)
Admission: RE | Admit: 2018-07-22 | Discharge: 2018-07-22 | Disposition: A | Discharge status: Home / Self Care | Payer: Medicare HMO | Source: Ambulatory Visit | Attending: Acute Care | Admitting: Acute Care

## 2018-07-22 ENCOUNTER — Encounter: Payer: Self-pay | Admitting: Acute Care

## 2018-07-22 DIAGNOSIS — Z95810 Presence of automatic (implantable) cardiac defibrillator: Secondary | ICD-10-CM | POA: Diagnosis not present

## 2018-07-22 DIAGNOSIS — I5042 Chronic combined systolic (congestive) and diastolic (congestive) heart failure: Secondary | ICD-10-CM

## 2018-07-22 DIAGNOSIS — F17210 Nicotine dependence, cigarettes, uncomplicated: Secondary | ICD-10-CM

## 2018-07-22 DIAGNOSIS — Z122 Encounter for screening for malignant neoplasm of respiratory organs: Secondary | ICD-10-CM

## 2018-07-22 NOTE — Progress Notes (Signed)
Shared Decision Making Visit Lung Cancer Screening Program [phone])   Eligibility:  Age 77 y.o.  Pack Years Smoking History Calculation 73 pack year smoking history (# packs/per year x # years smoked)  Recent History of coughing up blood  no  Unexplained weight loss? no ( >Than 15 pounds within the last 6 months )  Prior History Lung / other cancer no (Diagnosis within the last 5 years already requiring surveillance chest CT Scans).  Smoking Status Current Smoker  Former Smokers: Years since quit: NA  Quit Date: NA  Visit Components:  Discussion included one or more decision making aids. yes  Discussion included risk/benefits of screening. yes  Discussion included potential follow up diagnostic testing for abnormal scans. yes  Discussion included meaning and risk of over diagnosis. yes  Discussion included meaning and risk of False Positives. yes  Discussion included meaning of total radiation exposure. yes  Counseling Included:  Importance of adherence to annual lung cancer LDCT screening. yes  Impact of comorbidities on ability to participate in the program. yes  Ability and willingness to under diagnostic treatment. yes  Smoking Cessation Counseling:  Current Smokers:   Discussed importance of smoking cessation. yes  Information about tobacco cessation classes and interventions provided to patient. yes  Patient provided with "ticket" for LDCT Scan. yes  Symptomatic Patient. No  Counseling NA  Diagnosis Code: Tobacco Use Z72.0  Asymptomatic Patient yes  Counseling (Intermediate counseling: > three minutes counseling) U0454  Former Smokers:   Discussed the importance of maintaining cigarette abstinence. yes  Diagnosis Code: Personal History of Nicotine Dependence. U98.119  Information about tobacco cessation classes and interventions provided to patient. Yes  Patient provided with "ticket" for LDCT Scan. yes  Written Order for Lung Cancer  Screening with LDCT placed in Epic. Yes (CT Chest Lung Cancer Screening Low Dose W/O CM) [id number] Z12.2-Screening of respiratory organs Z87.891-Personal history of nicotine dependence  I have spent 25 minutes of face to face time with Mr. Weida discussing the risks and benefits of lung cancer screening. We viewed a power point together that explained in detail the above noted topics. We paused at intervals to allow for questions to be asked and answered to ensure understanding.We discussed that the single most powerful action that he can take to decrease his risk of developing lung cancer is to quit smoking. We discussed whether or not he is ready to commit to setting a quit date. We discussed options for tools to aid in quitting smoking including nicotine replacement therapy, non-nicotine medications, support groups, Quit Smart classes, and behavior modification. We discussed that often times setting smaller, more achievable goals, such as eliminating 1 cigarette a day for a week and then 2 cigarettes a day for a week can be helpful in slowly decreasing the number of cigarettes smoked. This allows for a sense of accomplishment as well as providing a clinical benefit. I gave him the " Be Stronger Than Your Excuses" card with contact information for community resources, classes, free nicotine replacement therapy, and access to mobile apps, text messaging, and on-line smoking cessation help. I have also given him my card and contact information in the event he needs to contact me. We discussed the time and location of the scan, and that either Abigail Miyamoto RN or I will call with the results within 24-48 hours of receiving them. I have offered him  a copy of the power point we viewed  as a resource in the event they need reinforcement  of the concepts we discussed today in the office. The patient verbalized understanding of all of  the above and had no further questions upon leaving the office. They have my  contact information in the event they have any further questions.  I spent 3 minutes counseling on smoking cessation and the health risks of continued tobacco abuse.  I explained to the patient that there has been a high incidence of coronary artery disease noted on these exams. I explained that this is a non-gated exam therefore degree or severity cannot be determined. This patient is  on statin therapy. I have asked the patient to follow-up with their PCP regarding any incidental finding of coronary artery disease and management with diet or medication as their PCP  feels is clinically indicated. The patient verbalized understanding of the above and had no further questions upon completion of the visit.      Bevelyn Ngo, NP 07/22/2018 3:01 PM

## 2018-07-23 NOTE — Progress Notes (Signed)
EPIC Encounter for ICM Monitoring  Patient Name: [name] [name] is a 77 y.o. male Date: 07/23/2018 Primary Care Physican: Raina Mina., MD Primary Cardiologist:Crenshaw Electrophysiologist: [name] Dry Weight:192lbs Bi-V Pacing: 97%  AT/AF Burden 47%       Heart Failure questions reviewed, pt asymptomatic.  He had test for cancer lung screening and waiting on results.   Thoracic impedance normal.  Prescribed dosage: Furosemide 20 mg 2tablets (40 mg total) daily. Potassium 20 mEq 1tablet twice aday.   Labs: 05/18/2018 Creatinine1.80, BUN45, Potassium5.1, Sodium141, FHPD90-09 05/17/2018 Creatinine1.86, BUN45, Potassium5.2, Sodium140, [id number]  05/16/2018 Creatinine1.30, BUN31, Potassium4.9, [id number] @ 2:08 AM  05/16/2018 Creatinine1.37, BUN23, Potassium5.1, [id number], HSFJ99-09 @ 1:50 AM  03/05/2018 Creatinine 1.30, BUN 13, Potassium 3.4, Sodium 147, EGFR 53-62 Care Everywhere 09/17/2017 Creatinine1.31, BUN26, Potassium4.1, Sodium145, [id number] 06/26/2017 Creatinine1.13, BUN27, Potassium5.1, Sodium143, [id number]  06/19/2017 Creatinine1.31, BUN33, Potassium5.1, [id number], ARUO40-01  06/12/2017 Creatinine1.44, BUN41, Potassium5.5, [id number], DGWZ59-01  Recommendations: No changes.    Encouraged to call for fluid symptoms.  Follow-up plan: ICM clinic phone appointment on 08/22/2018.   Office appointment scheduled 09/06/2018 with Dr. Stanford Breed.    Copy of ICM check sent to Dr. Rayann Heman.   3 month ICM trend: 08/22/2018    1 Year ICM trend:       Rosalene Billings, RN 07/23/2018 2:05 PM

## 2018-07-25 ENCOUNTER — Telehealth: Payer: Self-pay | Admitting: Acute Care

## 2018-07-25 DIAGNOSIS — Z122 Encounter for screening for malignant neoplasm of respiratory organs: Secondary | ICD-10-CM

## 2018-07-25 DIAGNOSIS — F17210 Nicotine dependence, cigarettes, uncomplicated: Principal | ICD-10-CM

## 2018-07-26 NOTE — Telephone Encounter (Signed)
LMTC x 1  

## 2018-07-26 NOTE — Telephone Encounter (Signed)
Pt's daughter informed of CT results per Kandice Robinsons, NP.  PT's daughter verbalized understanding.  Copy sent to PCP.  Order placed for 1 yr f/u CT.

## 2018-08-02 ENCOUNTER — Telehealth: Payer: Self-pay

## 2018-08-02 NOTE — Telephone Encounter (Signed)
Spoke with pt and reminded pt of remote transmission that is due today. Pt verbalized understanding.   

## 2018-08-05 ENCOUNTER — Telehealth: Payer: Self-pay | Admitting: Cardiology

## 2018-08-05 ENCOUNTER — Telehealth: Payer: Self-pay | Admitting: Emergency Medicine

## 2018-08-05 NOTE — Telephone Encounter (Signed)
Spoke with pt dtr, Aware of dr crenshaw's recommendations.  

## 2018-08-05 NOTE — Telephone Encounter (Signed)
New Message    Patients daughter is calling because her father passed away on 08-06-2023. She said that the EMS found him in cardiac arrest. They are needing Dr. [name] to sign the death certificate. Please to discuss.

## 2018-08-05 NOTE — Telephone Encounter (Signed)
I can sign death certificate Olga Millers

## 2018-08-05 NOTE — Telephone Encounter (Signed)
Called and spoke with the daughter nothing further needed.

## 2018-08-05 NOTE — Telephone Encounter (Signed)
Returned call to patient's daughter [name] [name].She stated father was found deceased in his camper 16-Apr-2023.Stated she wanted to know if Dr.Crenshaw would sign death certificate since he died unattended.Sympathy expressed.Advised I will send message to Dr.Crenshaw.

## 2018-08-11 DEATH — deceased

## 2018-08-15 LAB — CUP PACEART REMOTE DEVICE CHECK
Battery Remaining Longevity: 20 mo
HIGH POWER IMPEDANCE MEASURED VALUE: 95 Ohm
HighPow Impedance: 95 Ohm
Implantable Lead Implant Date: 20130912
Implantable Lead Implant Date: 20130912
Implantable Lead Location: 753859
Implantable Pulse Generator Implant Date: 20130912
Lead Channel Pacing Threshold Amplitude: 0.625 V
Lead Channel Pacing Threshold Pulse Width: 0.5 ms
Lead Channel Pacing Threshold Pulse Width: 0.8 ms
Lead Channel Setting Pacing Pulse Width: 0.5 ms
Lead Channel Setting Pacing Pulse Width: 0.8 ms
Lead Channel Setting Sensing Sensitivity: 0.5 mV
MDC IDC LEAD IMPLANT DT: 20130912
MDC IDC LEAD LOCATION: 753858
MDC IDC LEAD LOCATION: 753860
MDC IDC MSMT BATTERY REMAINING PERCENTAGE: 27 %
MDC IDC MSMT BATTERY VOLTAGE: 2.83 V
MDC IDC MSMT LEADCHNL LV IMPEDANCE VALUE: 1425 Ohm
MDC IDC MSMT LEADCHNL LV PACING THRESHOLD AMPLITUDE: 2.25 V
MDC IDC MSMT LEADCHNL RA IMPEDANCE VALUE: 480 Ohm
MDC IDC MSMT LEADCHNL RA SENSING INTR AMPL: 1.8 mV
MDC IDC MSMT LEADCHNL RV IMPEDANCE VALUE: 510 Ohm
MDC IDC MSMT LEADCHNL RV SENSING INTR AMPL: 12 mV
MDC IDC PG SERIAL: 1075181
MDC IDC SESS DTM: 20190809143516
MDC IDC SET LEADCHNL LV PACING AMPLITUDE: 3.25 V
MDC IDC SET LEADCHNL RV PACING AMPLITUDE: 2 V

## 2018-08-22 ENCOUNTER — Ambulatory Visit: Payer: Medicare HMO | Admitting: Emergency Medicine

## 2018-09-06 ENCOUNTER — Ambulatory Visit: Payer: Medicare HMO | Admitting: Cardiology

## 2018-10-08 ENCOUNTER — Telehealth: Payer: Self-pay | Admitting: Emergency Medicine

## 2018-10-08 NOTE — Telephone Encounter (Signed)
Called and spoke with pt's daughter [name] [name] who stated she would like Dr. Delton Coombes to call her at his convenience.  [name] [name] and her sister came by the office today and stated she wanted to thank RB for all the care he had done for her father over the years.  Pt has passed away but [name] [name] would like RB to call her so she can personally thank him and to talk with him in regards to pt.  [name] [name] can be reached at [phone]

## 2018-10-09 NOTE — Telephone Encounter (Signed)
I called Stanton Kidney, left a message. Will try her again

## 2018-10-18 NOTE — Telephone Encounter (Signed)
Pt's daughter would like RB to contact her-Debra can be reached at [phone] RB is out of the office until 10/23/2018 He will call her back at that time.

## 2018-10-23 NOTE — Telephone Encounter (Signed)
Dr. Delton Coombes can we close this encounter? I wasn't sure if you spoke to the daughter yet?

## 2018-10-30 NOTE — Telephone Encounter (Signed)
Dr. Delton Coombes, please advise if this has been taken care of for pt's daughter Stanton Kidney. Thanks!

## 2018-10-30 NOTE — Telephone Encounter (Signed)
I was able to reach Stanton Kidney, talked to her about her Dad. Thanked her for allowing [place] to assist with his care.
